# Patient Record
Sex: Female | Born: 1935
Health system: Southern US, Community
[De-identification: ages and names within clinical notes are randomized; demographics above are authoritative.]

## PROBLEM LIST (undated history)

## (undated) DIAGNOSIS — I10 Essential (primary) hypertension: Secondary | ICD-10-CM

## (undated) DIAGNOSIS — K589 Irritable bowel syndrome without diarrhea: Secondary | ICD-10-CM

## (undated) DIAGNOSIS — I471 Supraventricular tachycardia, unspecified: Secondary | ICD-10-CM

## (undated) DIAGNOSIS — R7309 Other abnormal glucose: Secondary | ICD-10-CM

## (undated) DIAGNOSIS — M1990 Unspecified osteoarthritis, unspecified site: Secondary | ICD-10-CM

## (undated) DIAGNOSIS — J40 Bronchitis, not specified as acute or chronic: Secondary | ICD-10-CM

## (undated) DIAGNOSIS — I2510 Atherosclerotic heart disease of native coronary artery without angina pectoris: Secondary | ICD-10-CM

## (undated) DIAGNOSIS — I059 Rheumatic mitral valve disease, unspecified: Secondary | ICD-10-CM

## (undated) DIAGNOSIS — I872 Venous insufficiency (chronic) (peripheral): Secondary | ICD-10-CM

## (undated) DIAGNOSIS — E785 Hyperlipidemia, unspecified: Secondary | ICD-10-CM

## (undated) DIAGNOSIS — K449 Diaphragmatic hernia without obstruction or gangrene: Secondary | ICD-10-CM

## (undated) DIAGNOSIS — I8000 Phlebitis and thrombophlebitis of superficial vessels of unspecified lower extremity: Secondary | ICD-10-CM

## (undated) DIAGNOSIS — M810 Age-related osteoporosis without current pathological fracture: Secondary | ICD-10-CM

## (undated) DIAGNOSIS — E559 Vitamin D deficiency, unspecified: Secondary | ICD-10-CM

## (undated) DIAGNOSIS — I480 Paroxysmal atrial fibrillation: Secondary | ICD-10-CM

## (undated) DIAGNOSIS — F411 Generalized anxiety disorder: Secondary | ICD-10-CM

## (undated) HISTORY — DX: Generalized anxiety disorder: F41.1

## (undated) HISTORY — DX: Other abnormal glucose: R73.09

## (undated) HISTORY — DX: Hyperlipidemia, unspecified: E78.5

## (undated) HISTORY — DX: Supraventricular tachycardia, unspecified: I47.10

## (undated) HISTORY — DX: Bronchitis, not specified as acute or chronic: J40

## (undated) HISTORY — DX: Irritable bowel syndrome, unspecified: K58.9

## (undated) HISTORY — DX: Vitamin D deficiency, unspecified: E55.9

## (undated) HISTORY — DX: Age-related osteoporosis without current pathological fracture: M81.0

## (undated) HISTORY — DX: Unspecified osteoarthritis, unspecified site: M19.90

## (undated) HISTORY — DX: Rheumatic mitral valve disease, unspecified: I05.9

## (undated) HISTORY — DX: Essential (primary) hypertension: I10

## (undated) HISTORY — DX: Phlebitis and thrombophlebitis of superficial vessels of unspecified lower extremity: I80.00

## (undated) HISTORY — DX: Venous insufficiency (chronic) (peripheral): I87.2

## (undated) HISTORY — DX: Atherosclerotic heart disease of native coronary artery without angina pectoris: I25.10

## (undated) HISTORY — PX: VESICOVAGINAL FISTULA CLOSURE W/ TAH: SUR271

## (undated) HISTORY — DX: Paroxysmal atrial fibrillation: I48.0

## (undated) HISTORY — DX: Diaphragmatic hernia without obstruction or gangrene: K44.9

## (undated) HISTORY — DX: Supraventricular tachycardia: I47.1

---

## 1999-01-29 ENCOUNTER — Other Ambulatory Visit: Admission: RE | Admit: 1999-01-29 | Discharge: 1999-01-29 | Payer: Self-pay | Admitting: Obstetrics and Gynecology

## 1999-09-30 ENCOUNTER — Ambulatory Visit (HOSPITAL_COMMUNITY): Admission: RE | Admit: 1999-09-30 | Discharge: 1999-09-30 | Payer: Self-pay | Admitting: Gastroenterology

## 1999-09-30 ENCOUNTER — Encounter: Payer: Self-pay | Admitting: Gastroenterology

## 2000-01-10 ENCOUNTER — Encounter: Payer: Self-pay | Admitting: Pulmonary Disease

## 2000-01-10 ENCOUNTER — Ambulatory Visit (HOSPITAL_COMMUNITY): Admission: RE | Admit: 2000-01-10 | Discharge: 2000-01-10 | Payer: Self-pay | Admitting: Pulmonary Disease

## 2001-02-10 ENCOUNTER — Ambulatory Visit (HOSPITAL_COMMUNITY): Admission: RE | Admit: 2001-02-10 | Discharge: 2001-02-10 | Payer: Self-pay | Admitting: Pulmonary Disease

## 2001-02-10 ENCOUNTER — Encounter: Payer: Self-pay | Admitting: Pulmonary Disease

## 2001-07-12 ENCOUNTER — Other Ambulatory Visit: Admission: RE | Admit: 2001-07-12 | Discharge: 2001-07-12 | Payer: Self-pay | Admitting: Obstetrics and Gynecology

## 2002-02-24 ENCOUNTER — Ambulatory Visit (HOSPITAL_COMMUNITY): Admission: RE | Admit: 2002-02-24 | Discharge: 2002-02-24 | Payer: Self-pay | Admitting: Obstetrics and Gynecology

## 2002-02-24 ENCOUNTER — Encounter: Payer: Self-pay | Admitting: Obstetrics and Gynecology

## 2003-03-21 ENCOUNTER — Ambulatory Visit (HOSPITAL_COMMUNITY): Admission: RE | Admit: 2003-03-21 | Discharge: 2003-03-21 | Payer: Self-pay | Admitting: Obstetrics and Gynecology

## 2004-03-25 ENCOUNTER — Ambulatory Visit (HOSPITAL_COMMUNITY): Admission: RE | Admit: 2004-03-25 | Discharge: 2004-03-25 | Payer: Self-pay | Admitting: Obstetrics and Gynecology

## 2004-04-22 ENCOUNTER — Ambulatory Visit: Payer: Self-pay | Admitting: Pulmonary Disease

## 2004-06-17 ENCOUNTER — Ambulatory Visit: Payer: Self-pay | Admitting: Pulmonary Disease

## 2004-09-11 ENCOUNTER — Ambulatory Visit: Payer: Self-pay | Admitting: Pulmonary Disease

## 2004-09-12 ENCOUNTER — Ambulatory Visit: Payer: Self-pay | Admitting: Pulmonary Disease

## 2004-10-17 ENCOUNTER — Ambulatory Visit: Payer: Self-pay | Admitting: Pulmonary Disease

## 2004-10-26 ENCOUNTER — Emergency Department (HOSPITAL_COMMUNITY): Admission: EM | Admit: 2004-10-26 | Discharge: 2004-10-26 | Payer: Self-pay | Admitting: Emergency Medicine

## 2005-04-17 ENCOUNTER — Ambulatory Visit (HOSPITAL_COMMUNITY): Admission: RE | Admit: 2005-04-17 | Discharge: 2005-04-17 | Payer: Self-pay | Admitting: Pulmonary Disease

## 2005-08-06 ENCOUNTER — Ambulatory Visit: Payer: Self-pay | Admitting: Pulmonary Disease

## 2005-09-18 ENCOUNTER — Ambulatory Visit: Payer: Self-pay | Admitting: Pulmonary Disease

## 2005-11-06 ENCOUNTER — Ambulatory Visit: Payer: Self-pay | Admitting: Pulmonary Disease

## 2005-12-19 ENCOUNTER — Ambulatory Visit: Payer: Self-pay | Admitting: Pulmonary Disease

## 2006-04-17 ENCOUNTER — Ambulatory Visit: Payer: Self-pay | Admitting: Pulmonary Disease

## 2006-04-27 ENCOUNTER — Ambulatory Visit: Payer: Self-pay | Admitting: Gastroenterology

## 2006-05-07 ENCOUNTER — Ambulatory Visit: Payer: Self-pay | Admitting: Family Medicine

## 2006-06-30 ENCOUNTER — Ambulatory Visit (HOSPITAL_COMMUNITY): Admission: RE | Admit: 2006-06-30 | Discharge: 2006-06-30 | Payer: Self-pay | Admitting: Pulmonary Disease

## 2006-10-22 ENCOUNTER — Ambulatory Visit: Payer: Self-pay | Admitting: Pulmonary Disease

## 2007-03-02 ENCOUNTER — Ambulatory Visit: Payer: Self-pay | Admitting: Internal Medicine

## 2007-03-04 ENCOUNTER — Telehealth: Payer: Self-pay | Admitting: Pulmonary Disease

## 2007-03-15 DIAGNOSIS — E7800 Pure hypercholesterolemia, unspecified: Secondary | ICD-10-CM

## 2007-03-15 DIAGNOSIS — K449 Diaphragmatic hernia without obstruction or gangrene: Secondary | ICD-10-CM | POA: Insufficient documentation

## 2007-03-15 DIAGNOSIS — J40 Bronchitis, not specified as acute or chronic: Secondary | ICD-10-CM

## 2007-03-15 DIAGNOSIS — I059 Rheumatic mitral valve disease, unspecified: Secondary | ICD-10-CM | POA: Insufficient documentation

## 2007-03-15 DIAGNOSIS — I10 Essential (primary) hypertension: Secondary | ICD-10-CM

## 2007-03-15 DIAGNOSIS — M810 Age-related osteoporosis without current pathological fracture: Secondary | ICD-10-CM

## 2007-03-15 DIAGNOSIS — K589 Irritable bowel syndrome without diarrhea: Secondary | ICD-10-CM | POA: Insufficient documentation

## 2007-03-15 DIAGNOSIS — F419 Anxiety disorder, unspecified: Secondary | ICD-10-CM

## 2007-03-15 DIAGNOSIS — M1990 Unspecified osteoarthritis, unspecified site: Secondary | ICD-10-CM | POA: Insufficient documentation

## 2007-03-16 ENCOUNTER — Ambulatory Visit: Payer: Self-pay | Admitting: Pulmonary Disease

## 2007-03-16 DIAGNOSIS — E1129 Type 2 diabetes mellitus with other diabetic kidney complication: Secondary | ICD-10-CM | POA: Insufficient documentation

## 2007-03-16 DIAGNOSIS — E119 Type 2 diabetes mellitus without complications: Secondary | ICD-10-CM | POA: Insufficient documentation

## 2007-03-16 LAB — CONVERTED CEMR LAB
ALT: 17 U/L
AST: 19 U/L
Albumin: 4 g/dL
Alkaline Phosphatase: 48 U/L
BUN: 9 mg/dL
Basophils Absolute: 0.1 10*3/uL
Basophils Relative: 1.5 % — ABNORMAL HIGH
Bilirubin, Direct: 0.1 mg/dL
CO2: 29 meq/L
Calcium: 9.4 mg/dL
Chloride: 101 meq/L
Cholesterol: 203 mg/dL
Creatinine, Ser: 0.8 mg/dL
Direct LDL: 111.7 mg/dL
Eosinophils Absolute: 0.1 10*3/uL
Eosinophils Relative: 2.7 %
GFR calc Af Amer: 91 mL/min
GFR calc non Af Amer: 75 mL/min
Glucose, Bld: 133 mg/dL — ABNORMAL HIGH
HCT: 37.3 %
HDL: 63.5 mg/dL
Hemoglobin: 12.9 g/dL
Hgb A1c MFr Bld: 6.7 % — ABNORMAL HIGH
Lymphocytes Relative: 33.9 %
MCHC: 34.5 g/dL
MCV: 90.2 fL
Monocytes Absolute: 0.4 10*3/uL
Monocytes Relative: 7.7 %
Neutro Abs: 2.6 10*3/uL
Neutrophils Relative %: 54.2 %
Platelets: 226 10*3/uL
Potassium: 4.3 meq/L
RBC: 4.14 M/uL
RDW: 12.7 %
Sodium: 137 meq/L
TSH: 0.95 u[IU]/mL
Total Bilirubin: 0.7 mg/dL
Total CHOL/HDL Ratio: 3.2
Total Protein: 7 g/dL
Triglycerides: 118 mg/dL
VLDL: 24 mg/dL
WBC: 4.9 10*3/uL

## 2007-03-24 ENCOUNTER — Ambulatory Visit: Payer: Self-pay | Admitting: Internal Medicine

## 2007-03-31 ENCOUNTER — Telehealth: Payer: Self-pay | Admitting: Pulmonary Disease

## 2007-05-06 ENCOUNTER — Telehealth (INDEPENDENT_AMBULATORY_CARE_PROVIDER_SITE_OTHER): Payer: Self-pay | Admitting: *Deleted

## 2007-05-21 ENCOUNTER — Encounter: Payer: Self-pay | Admitting: Pulmonary Disease

## 2007-05-31 ENCOUNTER — Encounter: Payer: Self-pay | Admitting: Pulmonary Disease

## 2007-08-10 ENCOUNTER — Ambulatory Visit (HOSPITAL_COMMUNITY): Admission: RE | Admit: 2007-08-10 | Discharge: 2007-08-10 | Payer: Self-pay | Admitting: Pulmonary Disease

## 2007-08-23 ENCOUNTER — Ambulatory Visit: Payer: Self-pay | Admitting: Pulmonary Disease

## 2007-08-29 LAB — CONVERTED CEMR LAB
Alkaline Phosphatase: 54 units/L (ref 39–117)
Basophils Relative: 0.7 % (ref 0.0–1.0)
Bilirubin, Direct: 0.1 mg/dL (ref 0.0–0.3)
CO2: 31 meq/L (ref 19–32)
Calcium: 9.7 mg/dL (ref 8.4–10.5)
Chloride: 107 meq/L (ref 96–112)
Cholesterol: 169 mg/dL (ref 0–200)
Creatinine, Ser: 1 mg/dL (ref 0.4–1.2)
Eosinophils Relative: 3.6 % (ref 0.0–5.0)
GFR calc Af Amer: 70 mL/min
HDL: 61 mg/dL (ref >39.0)
Hgb A1c MFr Bld: 6.7 % — ABNORMAL HIGH (ref 4.6–6.0)
LDL Cholesterol: 92 mg/dL (ref 0–99)
Monocytes Absolute: 0.5 10*3/uL (ref 0.1–1.0)
Neutro Abs: 3.1 10*3/uL (ref 1.4–7.7)
Platelets: 240 10*3/uL (ref 150–400)
Potassium: 5.1 meq/L (ref 3.5–5.1)
RDW: 12.3 % (ref 11.5–14.6)
Triglycerides: 80 mg/dL (ref 0–149)
VLDL: 16 mg/dL (ref 0–40)
Vit D, 1,25-Dihydroxy: 21 — ABNORMAL LOW (ref 30–89)

## 2007-10-11 ENCOUNTER — Telehealth (INDEPENDENT_AMBULATORY_CARE_PROVIDER_SITE_OTHER): Payer: Self-pay | Admitting: *Deleted

## 2007-10-12 ENCOUNTER — Ambulatory Visit: Payer: Self-pay | Admitting: Pulmonary Disease

## 2007-10-13 ENCOUNTER — Emergency Department (HOSPITAL_COMMUNITY): Admission: EM | Admit: 2007-10-13 | Discharge: 2007-10-13 | Payer: Self-pay | Admitting: Emergency Medicine

## 2007-10-13 ENCOUNTER — Ambulatory Visit: Payer: Self-pay

## 2007-10-20 ENCOUNTER — Ambulatory Visit: Payer: Self-pay | Admitting: Pulmonary Disease

## 2007-10-20 DIAGNOSIS — I8000 Phlebitis and thrombophlebitis of superficial vessels of unspecified lower extremity: Secondary | ICD-10-CM

## 2007-10-22 ENCOUNTER — Telehealth: Payer: Self-pay | Admitting: Pulmonary Disease

## 2007-10-22 LAB — CONVERTED CEMR LAB: Prothrombin Time: 23.7 s — ABNORMAL HIGH (ref 10.9–13.3)

## 2007-10-27 ENCOUNTER — Ambulatory Visit: Payer: Self-pay | Admitting: Pulmonary Disease

## 2007-10-28 ENCOUNTER — Telehealth (INDEPENDENT_AMBULATORY_CARE_PROVIDER_SITE_OTHER): Payer: Self-pay | Admitting: *Deleted

## 2007-10-28 LAB — CONVERTED CEMR LAB: INR: 2 — ABNORMAL HIGH (ref 0.8–1.0)

## 2007-11-03 ENCOUNTER — Ambulatory Visit: Payer: Self-pay | Admitting: Pulmonary Disease

## 2007-11-04 ENCOUNTER — Telehealth: Payer: Self-pay | Admitting: Pulmonary Disease

## 2007-11-04 LAB — CONVERTED CEMR LAB: Prothrombin Time: 22.1 s — ABNORMAL HIGH (ref 10.9–13.3)

## 2007-11-16 ENCOUNTER — Ambulatory Visit: Payer: Self-pay | Admitting: Pulmonary Disease

## 2007-11-18 ENCOUNTER — Telehealth: Payer: Self-pay | Admitting: Pulmonary Disease

## 2007-11-25 LAB — CONVERTED CEMR LAB: INR: 2.2 — ABNORMAL HIGH (ref 0.8–1.0)

## 2007-12-01 ENCOUNTER — Ambulatory Visit: Payer: Self-pay | Admitting: Pulmonary Disease

## 2007-12-06 ENCOUNTER — Telehealth (INDEPENDENT_AMBULATORY_CARE_PROVIDER_SITE_OTHER): Payer: Self-pay | Admitting: *Deleted

## 2007-12-06 LAB — CONVERTED CEMR LAB: Prothrombin Time: 16.1 s — ABNORMAL HIGH (ref 10.9–13.3)

## 2007-12-07 ENCOUNTER — Ambulatory Visit: Payer: Self-pay | Admitting: Pulmonary Disease

## 2007-12-21 ENCOUNTER — Ambulatory Visit: Payer: Self-pay | Admitting: Pulmonary Disease

## 2007-12-22 ENCOUNTER — Telehealth: Payer: Self-pay | Admitting: Pulmonary Disease

## 2007-12-22 LAB — CONVERTED CEMR LAB: INR: 3.3 — ABNORMAL HIGH (ref 0.8–1.0)

## 2008-01-03 ENCOUNTER — Ambulatory Visit: Payer: Self-pay | Admitting: Pulmonary Disease

## 2008-01-05 ENCOUNTER — Telehealth: Payer: Self-pay | Admitting: Pulmonary Disease

## 2008-01-05 LAB — CONVERTED CEMR LAB
INR: 1.5 — ABNORMAL HIGH (ref 0.8–1.0)
Prothrombin Time: 17.3 s — ABNORMAL HIGH (ref 10.9–13.3)

## 2008-01-18 ENCOUNTER — Ambulatory Visit: Payer: Self-pay | Admitting: Vascular Surgery

## 2008-01-18 ENCOUNTER — Ambulatory Visit: Payer: Self-pay | Admitting: Pulmonary Disease

## 2008-01-19 ENCOUNTER — Encounter: Payer: Self-pay | Admitting: Pulmonary Disease

## 2008-01-19 ENCOUNTER — Ambulatory Visit: Admission: RE | Admit: 2008-01-19 | Discharge: 2008-01-19 | Payer: Self-pay | Admitting: Pulmonary Disease

## 2008-01-19 LAB — CONVERTED CEMR LAB
ALT: 21 units/L (ref 0–35)
AST: 19 units/L (ref 0–37)
Alkaline Phosphatase: 53 units/L (ref 39–117)
BUN: 12 mg/dL (ref 6–23)
Basophils Absolute: 0 10*3/uL (ref 0.0–0.1)
Basophils Relative: 0.6 % (ref 0.0–3.0)
Bilirubin, Direct: 0.1 mg/dL (ref 0.0–0.3)
Eosinophils Absolute: 0.1 10*3/uL (ref 0.0–0.7)
GFR calc non Af Amer: 52 mL/min
Glucose, Bld: 108 mg/dL — ABNORMAL HIGH (ref 70–99)
HCT: 37 % (ref 36.0–46.0)
Hgb A1c MFr Bld: 6.7 % — ABNORMAL HIGH (ref 4.6–6.0)
INR: 1.7 — ABNORMAL HIGH (ref 0.8–1.0)
Lymphocytes Relative: 30.4 % (ref 12.0–46.0)
MCV: 91 fL (ref 78.0–100.0)
Monocytes Relative: 8.9 % (ref 3.0–12.0)
Neutro Abs: 3.7 10*3/uL (ref 1.4–7.7)
Potassium: 4.2 meq/L (ref 3.5–5.1)
RBC: 4.06 M/uL (ref 3.87–5.11)
RDW: 13.1 % (ref 11.5–14.6)
Total Bilirubin: 0.7 mg/dL (ref 0.3–1.2)
WBC: 6.3 10*3/uL (ref 4.5–10.5)

## 2008-02-09 ENCOUNTER — Ambulatory Visit: Payer: Self-pay | Admitting: Pulmonary Disease

## 2008-02-10 ENCOUNTER — Telehealth: Payer: Self-pay | Admitting: Pulmonary Disease

## 2008-02-22 ENCOUNTER — Ambulatory Visit: Payer: Self-pay | Admitting: Pulmonary Disease

## 2008-03-06 ENCOUNTER — Ambulatory Visit: Payer: Self-pay | Admitting: Pulmonary Disease

## 2008-03-07 ENCOUNTER — Telehealth: Payer: Self-pay | Admitting: Pulmonary Disease

## 2008-04-03 ENCOUNTER — Ambulatory Visit: Payer: Self-pay | Admitting: Pulmonary Disease

## 2008-04-04 ENCOUNTER — Telehealth: Payer: Self-pay | Admitting: Pulmonary Disease

## 2008-04-04 LAB — CONVERTED CEMR LAB: INR: 1.8 — ABNORMAL HIGH (ref 0.8–1.0)

## 2008-04-19 ENCOUNTER — Ambulatory Visit: Payer: Self-pay | Admitting: Pulmonary Disease

## 2008-04-19 DIAGNOSIS — E559 Vitamin D deficiency, unspecified: Secondary | ICD-10-CM | POA: Insufficient documentation

## 2008-04-21 ENCOUNTER — Telehealth: Payer: Self-pay | Admitting: Pulmonary Disease

## 2008-04-23 LAB — CONVERTED CEMR LAB
AST: 17 units/L (ref 0–37)
Albumin: 3.9 g/dL (ref 3.5–5.2)
BUN: 11 mg/dL (ref 6–23)
Chloride: 104 meq/L (ref 96–112)
Eosinophils Absolute: 0.1 10*3/uL (ref 0.0–0.7)
Eosinophils Relative: 2.3 % (ref 0.0–5.0)
GFR calc Af Amer: 79 mL/min
GFR calc non Af Amer: 66 mL/min
Glucose, Bld: 129 mg/dL — ABNORMAL HIGH (ref 70–99)
HDL: 56.9 mg/dL (ref >39.0)
Hgb A1c MFr Bld: 6.7 % — ABNORMAL HIGH (ref 4.6–6.0)
LDL Cholesterol: 99 mg/dL (ref 0–99)
Lymphocytes Relative: 31.9 % (ref 12.0–46.0)
MCHC: 34.8 g/dL (ref 30.0–36.0)
MCV: 89.2 fL (ref 78.0–100.0)
Monocytes Relative: 8.8 % (ref 3.0–12.0)
Neutrophils Relative %: 56.7 % (ref 43.0–77.0)
Platelets: 205 10*3/uL (ref 150–400)
Potassium: 4.7 meq/L (ref 3.5–5.1)
RBC: 4.1 M/uL (ref 3.87–5.11)
RDW: 12.7 % (ref 11.5–14.6)
TSH: 0.99 microintl units/mL (ref 0.35–5.50)
Total CHOL/HDL Ratio: 3
Triglycerides: 70 mg/dL (ref 0–149)
VLDL: 14 mg/dL (ref 0–40)
WBC: 4.8 10*3/uL (ref 4.5–10.5)

## 2008-05-01 ENCOUNTER — Encounter: Payer: Self-pay | Admitting: Pulmonary Disease

## 2008-05-02 ENCOUNTER — Telehealth (INDEPENDENT_AMBULATORY_CARE_PROVIDER_SITE_OTHER): Payer: Self-pay | Admitting: *Deleted

## 2008-05-22 ENCOUNTER — Ambulatory Visit: Payer: Self-pay | Admitting: Pulmonary Disease

## 2008-05-23 ENCOUNTER — Telehealth: Payer: Self-pay | Admitting: Pulmonary Disease

## 2008-05-24 LAB — CONVERTED CEMR LAB: Prothrombin Time: 18.4 s — ABNORMAL HIGH (ref 10.9–13.3)

## 2008-06-12 ENCOUNTER — Ambulatory Visit: Payer: Self-pay | Admitting: Pulmonary Disease

## 2008-06-12 ENCOUNTER — Telehealth (INDEPENDENT_AMBULATORY_CARE_PROVIDER_SITE_OTHER): Payer: Self-pay | Admitting: *Deleted

## 2008-06-13 ENCOUNTER — Telehealth: Payer: Self-pay | Admitting: Pulmonary Disease

## 2008-06-13 LAB — CONVERTED CEMR LAB
INR: 2.9 — ABNORMAL HIGH (ref 0.8–1.0)
Prothrombin Time: 29.1 s — ABNORMAL HIGH (ref 10.9–13.3)

## 2008-06-26 ENCOUNTER — Telehealth (INDEPENDENT_AMBULATORY_CARE_PROVIDER_SITE_OTHER): Payer: Self-pay | Admitting: *Deleted

## 2008-06-27 ENCOUNTER — Emergency Department (HOSPITAL_COMMUNITY): Admission: EM | Admit: 2008-06-27 | Discharge: 2008-06-27 | Payer: Self-pay | Admitting: Emergency Medicine

## 2008-06-30 ENCOUNTER — Emergency Department (HOSPITAL_COMMUNITY): Admission: EM | Admit: 2008-06-30 | Discharge: 2008-07-01 | Payer: Self-pay | Admitting: Emergency Medicine

## 2008-07-10 ENCOUNTER — Ambulatory Visit: Payer: Self-pay | Admitting: Internal Medicine

## 2008-08-17 ENCOUNTER — Ambulatory Visit: Payer: Self-pay | Admitting: Pulmonary Disease

## 2008-08-19 LAB — CONVERTED CEMR LAB
BUN: 10 mg/dL (ref 6–23)
CO2: 32 meq/L (ref 19–32)
Calcium: 9.5 mg/dL (ref 8.4–10.5)
Chloride: 106 meq/L (ref 96–112)
Eosinophils Absolute: 0.1 10*3/uL (ref 0.0–0.7)
Eosinophils Relative: 2.9 % (ref 0.0–5.0)
GFR calc non Af Amer: 79.18 mL/min (ref >60)
Glucose, Bld: 105 mg/dL — ABNORMAL HIGH (ref 70–99)
HCT: 36.7 % (ref 36.0–46.0)
Lymphs Abs: 1.5 10*3/uL (ref 0.7–4.0)
Monocytes Absolute: 0.4 10*3/uL (ref 0.1–1.0)
Monocytes Relative: 8.6 % (ref 3.0–12.0)
Platelets: 209 10*3/uL (ref 150.0–400.0)
RBC: 4.09 M/uL (ref 3.87–5.11)
Sodium: 140 meq/L (ref 135–145)
WBC: 4.4 10*3/uL — ABNORMAL LOW (ref 4.5–10.5)

## 2008-08-22 ENCOUNTER — Encounter: Payer: Self-pay | Admitting: Pulmonary Disease

## 2008-08-22 ENCOUNTER — Ambulatory Visit: Payer: Self-pay

## 2008-09-22 ENCOUNTER — Telehealth (INDEPENDENT_AMBULATORY_CARE_PROVIDER_SITE_OTHER): Payer: Self-pay | Admitting: *Deleted

## 2008-09-26 ENCOUNTER — Ambulatory Visit: Payer: Self-pay | Admitting: Pulmonary Disease

## 2008-09-26 ENCOUNTER — Ambulatory Visit: Payer: Self-pay

## 2008-09-28 ENCOUNTER — Telehealth (INDEPENDENT_AMBULATORY_CARE_PROVIDER_SITE_OTHER): Payer: Self-pay | Admitting: *Deleted

## 2008-10-09 ENCOUNTER — Ambulatory Visit: Payer: Self-pay | Admitting: Pulmonary Disease

## 2008-10-10 ENCOUNTER — Telehealth: Payer: Self-pay | Admitting: Pulmonary Disease

## 2008-10-19 ENCOUNTER — Ambulatory Visit (HOSPITAL_COMMUNITY): Admission: RE | Admit: 2008-10-19 | Discharge: 2008-10-19 | Payer: Self-pay | Admitting: Pulmonary Disease

## 2008-10-21 LAB — CONVERTED CEMR LAB: Vit D, 25-Hydroxy: 23 ng/mL — ABNORMAL LOW (ref 30–89)

## 2008-10-26 ENCOUNTER — Ambulatory Visit: Payer: Self-pay | Admitting: Pulmonary Disease

## 2008-10-30 LAB — CONVERTED CEMR LAB
INR: 1.5 — ABNORMAL HIGH (ref 0.8–1.0)
Prothrombin Time: 16.3 s — ABNORMAL HIGH (ref 10.9–13.3)

## 2008-11-14 ENCOUNTER — Encounter: Payer: Self-pay | Admitting: Pulmonary Disease

## 2008-11-14 ENCOUNTER — Ambulatory Visit: Payer: Self-pay | Admitting: Family Medicine

## 2008-12-05 ENCOUNTER — Ambulatory Visit: Payer: Self-pay | Admitting: Pulmonary Disease

## 2008-12-06 LAB — CONVERTED CEMR LAB: INR: 1.7 — ABNORMAL HIGH (ref 0.8–1.0)

## 2009-01-02 ENCOUNTER — Telehealth (INDEPENDENT_AMBULATORY_CARE_PROVIDER_SITE_OTHER): Payer: Self-pay | Admitting: *Deleted

## 2009-01-03 ENCOUNTER — Ambulatory Visit: Payer: Self-pay | Admitting: Pulmonary Disease

## 2009-01-04 ENCOUNTER — Telehealth: Payer: Self-pay | Admitting: Pulmonary Disease

## 2009-01-05 LAB — CONVERTED CEMR LAB: Prothrombin Time: 12.6 s — ABNORMAL HIGH (ref 9.1–11.7)

## 2009-01-29 ENCOUNTER — Ambulatory Visit: Payer: Self-pay | Admitting: Pulmonary Disease

## 2009-01-30 ENCOUNTER — Telehealth (INDEPENDENT_AMBULATORY_CARE_PROVIDER_SITE_OTHER): Payer: Self-pay | Admitting: *Deleted

## 2009-02-02 ENCOUNTER — Ambulatory Visit: Payer: Self-pay | Admitting: Pulmonary Disease

## 2009-03-01 ENCOUNTER — Ambulatory Visit: Payer: Self-pay | Admitting: Pulmonary Disease

## 2009-03-02 ENCOUNTER — Telehealth: Payer: Self-pay | Admitting: Pulmonary Disease

## 2009-03-02 LAB — CONVERTED CEMR LAB: Prothrombin Time: 19.3 s — ABNORMAL HIGH (ref 9.1–11.7)

## 2009-03-22 ENCOUNTER — Telehealth (INDEPENDENT_AMBULATORY_CARE_PROVIDER_SITE_OTHER): Payer: Self-pay | Admitting: *Deleted

## 2009-03-30 ENCOUNTER — Telehealth (INDEPENDENT_AMBULATORY_CARE_PROVIDER_SITE_OTHER): Payer: Self-pay | Admitting: *Deleted

## 2009-04-11 ENCOUNTER — Ambulatory Visit: Payer: Self-pay | Admitting: Pulmonary Disease

## 2009-04-25 ENCOUNTER — Emergency Department (HOSPITAL_COMMUNITY): Admission: EM | Admit: 2009-04-25 | Discharge: 2009-04-25 | Payer: Self-pay | Admitting: Emergency Medicine

## 2009-04-29 ENCOUNTER — Emergency Department (HOSPITAL_COMMUNITY): Admission: EM | Admit: 2009-04-29 | Discharge: 2009-04-29 | Payer: Self-pay | Admitting: Emergency Medicine

## 2009-05-08 ENCOUNTER — Ambulatory Visit: Payer: Self-pay | Admitting: Pulmonary Disease

## 2009-05-09 ENCOUNTER — Telehealth: Payer: Self-pay | Admitting: Pulmonary Disease

## 2009-05-09 LAB — CONVERTED CEMR LAB
INR: 1.8 — ABNORMAL HIGH (ref 0.8–1.0)
Prothrombin Time: 18.3 s — ABNORMAL HIGH (ref 9.1–11.7)

## 2009-05-15 ENCOUNTER — Ambulatory Visit: Payer: Self-pay | Admitting: Pulmonary Disease

## 2009-06-07 ENCOUNTER — Ambulatory Visit: Payer: Self-pay | Admitting: Pulmonary Disease

## 2009-06-10 LAB — CONVERTED CEMR LAB
AST: 23 units/L (ref 0–37)
Bilirubin, Direct: 0.1 mg/dL (ref 0.0–0.3)
Cholesterol: 191 mg/dL (ref 0–200)
Eosinophils Absolute: 0.1 10*3/uL (ref 0.0–0.7)
Eosinophils Relative: 2.4 % (ref 0.0–5.0)
GFR calc non Af Amer: 79 mL/min (ref >60)
Glucose, Bld: 124 mg/dL — ABNORMAL HIGH (ref 70–99)
HCT: 38.9 % (ref 36.0–46.0)
HDL: 80.7 mg/dL (ref >39.00)
INR: 1.5 — ABNORMAL HIGH (ref 0.8–1.0)
LDL Cholesterol: 87 mg/dL (ref 0–99)
Monocytes Absolute: 0.3 10*3/uL (ref 0.1–1.0)
Platelets: 208 10*3/uL (ref 150.0–400.0)
Potassium: 3.8 meq/L (ref 3.5–5.1)
RDW: 12.4 % (ref 11.5–14.6)
Total Bilirubin: 0.7 mg/dL (ref 0.3–1.2)
Total CHOL/HDL Ratio: 2
Total Protein: 7.8 g/dL (ref 6.0–8.3)
Triglycerides: 119 mg/dL (ref 0.0–149.0)
VLDL: 23.8 mg/dL (ref 0.0–40.0)

## 2009-06-21 ENCOUNTER — Emergency Department (HOSPITAL_COMMUNITY): Admission: EM | Admit: 2009-06-21 | Discharge: 2009-06-21 | Payer: Self-pay | Admitting: Emergency Medicine

## 2009-06-25 ENCOUNTER — Emergency Department (HOSPITAL_COMMUNITY): Admission: EM | Admit: 2009-06-25 | Discharge: 2009-06-25 | Payer: Self-pay | Admitting: Emergency Medicine

## 2009-06-25 ENCOUNTER — Telehealth: Payer: Self-pay | Admitting: Pulmonary Disease

## 2009-07-05 ENCOUNTER — Ambulatory Visit: Payer: Self-pay | Admitting: Pulmonary Disease

## 2009-07-24 ENCOUNTER — Ambulatory Visit: Payer: Self-pay | Admitting: Pulmonary Disease

## 2009-08-07 ENCOUNTER — Ambulatory Visit: Payer: Self-pay | Admitting: Pulmonary Disease

## 2009-08-07 DIAGNOSIS — R079 Chest pain, unspecified: Secondary | ICD-10-CM | POA: Insufficient documentation

## 2009-09-04 ENCOUNTER — Ambulatory Visit: Payer: Self-pay | Admitting: Pulmonary Disease

## 2009-09-05 LAB — CONVERTED CEMR LAB
INR: 2.3 — ABNORMAL HIGH (ref 0.8–1.0)
Prothrombin Time: 23.6 s — ABNORMAL HIGH (ref 9.1–11.7)

## 2009-10-04 ENCOUNTER — Ambulatory Visit: Payer: Self-pay | Admitting: Pulmonary Disease

## 2009-10-07 LAB — CONVERTED CEMR LAB: INR: 2.5 — ABNORMAL HIGH (ref 0.8–1.0)

## 2009-10-08 ENCOUNTER — Telehealth: Payer: Self-pay | Admitting: Pulmonary Disease

## 2009-10-16 ENCOUNTER — Telehealth (INDEPENDENT_AMBULATORY_CARE_PROVIDER_SITE_OTHER): Payer: Self-pay | Admitting: *Deleted

## 2009-10-16 ENCOUNTER — Emergency Department (HOSPITAL_COMMUNITY): Admission: EM | Admit: 2009-10-16 | Discharge: 2009-10-16 | Payer: Self-pay | Admitting: Emergency Medicine

## 2009-10-18 ENCOUNTER — Telehealth: Payer: Self-pay | Admitting: Adult Health

## 2009-11-01 ENCOUNTER — Ambulatory Visit: Payer: Self-pay | Admitting: Pulmonary Disease

## 2009-11-05 ENCOUNTER — Telehealth: Payer: Self-pay | Admitting: Pulmonary Disease

## 2009-11-05 LAB — CONVERTED CEMR LAB
INR: 2.4 — ABNORMAL HIGH (ref 0.8–1.0)
Prothrombin Time: 25.3 s — ABNORMAL HIGH (ref 9.7–11.8)

## 2009-11-12 ENCOUNTER — Ambulatory Visit: Payer: Self-pay | Admitting: Pulmonary Disease

## 2009-11-13 LAB — CONVERTED CEMR LAB
BUN: 14 mg/dL (ref 6–23)
Calcium: 8.9 mg/dL (ref 8.4–10.5)
Creatinine, Ser: 0.9 mg/dL (ref 0.4–1.2)
GFR calc non Af Amer: 84.29 mL/min (ref >60)
Hgb A1c MFr Bld: 6.8 % — ABNORMAL HIGH (ref 4.6–6.5)

## 2009-11-22 ENCOUNTER — Ambulatory Visit (HOSPITAL_COMMUNITY): Admission: RE | Admit: 2009-11-22 | Discharge: 2009-11-22 | Payer: Self-pay | Admitting: Pulmonary Disease

## 2009-12-06 ENCOUNTER — Ambulatory Visit: Payer: Self-pay | Admitting: Pulmonary Disease

## 2009-12-10 ENCOUNTER — Telehealth: Payer: Self-pay | Admitting: Pulmonary Disease

## 2010-01-10 ENCOUNTER — Ambulatory Visit: Payer: Self-pay | Admitting: Pulmonary Disease

## 2010-01-14 LAB — CONVERTED CEMR LAB
INR: 1.8 — ABNORMAL HIGH
Prothrombin Time: 18.8 s — ABNORMAL HIGH

## 2010-02-08 ENCOUNTER — Ambulatory Visit: Payer: Self-pay | Admitting: Pulmonary Disease

## 2010-02-11 ENCOUNTER — Telehealth (INDEPENDENT_AMBULATORY_CARE_PROVIDER_SITE_OTHER): Payer: Self-pay | Admitting: *Deleted

## 2010-03-11 ENCOUNTER — Ambulatory Visit: Payer: Self-pay | Admitting: Pulmonary Disease

## 2010-03-13 ENCOUNTER — Telehealth: Payer: Self-pay | Admitting: Pulmonary Disease

## 2010-03-13 LAB — CONVERTED CEMR LAB
INR: 1.3 — ABNORMAL HIGH (ref 0.8–1.0)
Prothrombin Time: 13.8 s — ABNORMAL HIGH (ref 9.7–11.8)

## 2010-04-10 ENCOUNTER — Ambulatory Visit: Payer: Self-pay | Admitting: Pulmonary Disease

## 2010-04-11 ENCOUNTER — Telehealth: Payer: Self-pay | Admitting: Pulmonary Disease

## 2010-05-04 ENCOUNTER — Encounter: Payer: Self-pay | Admitting: Pulmonary Disease

## 2010-05-09 ENCOUNTER — Ambulatory Visit
Admission: RE | Admit: 2010-05-09 | Discharge: 2010-05-09 | Payer: Self-pay | Source: Home / Self Care | Attending: Pulmonary Disease | Admitting: Pulmonary Disease

## 2010-05-09 ENCOUNTER — Other Ambulatory Visit: Payer: Self-pay | Admitting: Pulmonary Disease

## 2010-05-09 LAB — PROTIME-INR
INR: 1.9 ratio — ABNORMAL HIGH (ref 0.8–1.0)
Prothrombin Time: 19.8 s — ABNORMAL HIGH (ref 10.2–12.4)

## 2010-05-13 ENCOUNTER — Ambulatory Visit: Admit: 2010-05-13 | Payer: Self-pay | Admitting: Pulmonary Disease

## 2010-05-13 ENCOUNTER — Telehealth (INDEPENDENT_AMBULATORY_CARE_PROVIDER_SITE_OTHER): Payer: Self-pay | Admitting: *Deleted

## 2010-05-14 ENCOUNTER — Telehealth: Payer: Self-pay | Admitting: Pulmonary Disease

## 2010-05-16 NOTE — Progress Notes (Signed)
Summary: nos appt  Phone Note Call from Patient   Caller: [email or web address]  Call For: Deno Sida Summary of Call: LMTCB x2 to rsc nos from 7/7. Initial call taken by: Darletta Moll,  October 18, 2009 3:38 PM

## 2010-05-16 NOTE — Progress Notes (Signed)
Summary: results - LMTCB x1  Phone Note Call from Patient Call back at Home Phone [phone]   Caller: Patient Call For: Asaiah Hunnicutt Reason for Call: Talk to Nurse Summary of Call: pt calling to get coumadin results.  please call her back. Initial call taken by: Eugene Gavia,  November 05, 2009 4:12 PM  Follow-up for Phone Call        PT/INR unsigned in EMR.  will forward to MW for recs.  please advise, thanks! Boone Master CNA/MA  November 05, 2009 4:18 PM  perfect, no change, recheck one month Follow-up by: Nyoka Cowden MD,  November 05, 2009 4:19 PM  Additional Follow-up for Phone Call Additional follow up Details #1::        [facility] TCB x1 Boone Master CNA/MA  November 05, 2009 4:21 PM    pt returned the call and is aware to keep the same and we will recheck in 1 month---the week of august 22 and pt to call the next day for results.  pt voiced her understanding of this. Randell Loop [facility]  November 05, 2009 4:27 PM

## 2010-05-16 NOTE — Progress Notes (Signed)
Summary: protime  Phone Note Call from Patient Call back at Home Phone [phone]   Caller: Patient Call For: nadel Summary of Call: pt waiting to hear back re: protime.  Initial call taken by: Tivis Ringer, CNA,  March 13, 2010 4:38 PM  Follow-up for Phone Call        Pls advise results, I printed them thanks Vernie Murders  March 13, 2010 4:44 PM   Additional Follow-up for Phone Call Additional follow up Details #1::        called and spoke with pt about her protime results----not thin enough per SN---rec now are for coumadin 5mg       1/2 tab on monday and 1 tab x 6 days---t,w,th,f,s,s.  pt repeated this back to me to make sure that she understood the dosing.  she will return to the lab dec 28 for repeat protime check. Randell Loop CMA  March 14, 2010 8:56 AM
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
## 2010-05-16 NOTE — Progress Notes (Signed)
Summary: lab results  Phone Note Call from Patient Call back at Home Phone [phone]   Caller: Patient Call For: Arvis Miguez Reason for Call: Lab or Test Results Summary of Call: Wants lab results. Initial call taken by: Darletta Moll,  October 08, 2009 9:58 AM  Follow-up for Phone Call        per SN---on coumadin 5mg   1 tab daily x 7 days---per SN recs---decrease---1 tab daily x 5 days  t,w,f,s,s and 1/2 tab x 2 days   m,th. this new dosing has been reviewed with pt and she did repeat this back to me correctly.  she is aware to call for any concerns or questions. called and spoke with pt and she is aware.  lab appt made for recheck of protime for week of 7-18. pt is aware of this appt and to call the next day for results.

## 2010-05-16 NOTE — Progress Notes (Signed)
Summary: protime results  Phone Note Call from Patient Call back at Home Phone [phone]   Caller: Patient Call For: NADEL Reason for Call: Talk to Nurse Summary of Call: Requesting protime results.  Blood was drawn Friday. Initial call taken by: Lehman Prom,  February 11, 2010 2:16 PM  Follow-up for Phone Call        please advise of lab results.  PT is unsigned in EMR.  thanks! Boone Master CNA/MA  February 11, 2010 3:35 PM   called spoke with patient.  advised of PT results/recs as stated by SN in append to 02-08-10 protime.  pt to keep coumadin dosing the same.  pt verbalized her understanding and will return in 1 months' time for redraw.  lab appt scheduled in idx. Boone Master CNA/MA  February 12, 2010 4:51 PM

## 2010-05-16 NOTE — Progress Notes (Signed)
Summary: protime  Phone Note Call from Patient Call back at Home Phone [phone]   Caller: Patient Call For: Tywanna Seifer Summary of Call: pt requests results / protime.  Initial call taken by: Tivis Ringer, CNA,  December 10, 2009 2:07 PM  Follow-up for Phone Call        PT unsigned in EMR -- Dr. Kriste Basque pls advise.  Thanks! Gweneth Dimitri RN  December 10, 2009 2:16 PM   Additional Follow-up for Phone Call Additional follow up Details #1::        per SN---keep the same dosing of coumadin 5mg    1 x 5 days  t,w,f,s,s and 1/2 x 2 days on m, th.  called and spoke with pt and she is aware and will return in 1 month for recheck of protime.  lmomtcb for pt Randell Loop [facility]  December 10, 2009 4:22 PM     Additional Follow-up for Phone Call Additional follow up Details #2::    pt returned my call and she is aware of protime recheck in the week of sept 26.  pt will call back for any questions and she is aware to take the coumadin the same for now. Randell Loop [facility]  December 10, 2009 4:52 PM

## 2010-05-16 NOTE — Progress Notes (Signed)
Summary: burning in chest  Phone Note Call from Patient   Caller: Patient Call For: [name] Summary of Call: pt went to er for burning in chest was told to follow up with dr Korion Cuevas  Initial call taken by: Rickard Patience,  June 25, 2009 10:32 AM  Follow-up for Phone Call        pt went to the ER over the weekend for chest pain. she was released with inst to f/u with SN in 3 days. Pt schedueld to see TP on 3/17 at 4:15 pt aware. Carron Curie CMA  June 25, 2009 10:47 AM

## 2010-05-16 NOTE — Progress Notes (Signed)
Summary: appt  Phone Note Call from Patient Call back at Home Phone [phone] Call back at cell# [phone]   Caller: Patient Call For: nadel Reason for Call: Talk to Nurse Summary of Call: Forehead real tight, eyes swelling, has been outside some.  Started Sunday. itching really bad.  Would like to be seen. Initial call taken by: Eugene Gavia,  October 16, 2009 1:32 PM  Follow-up for Phone Call        Spoke with pt.  She c/o "itching all over"- red rash on stomach and also watery eyes.  Left eye is swollen per pt.  OV with TP tommorrow am at 9:00 am.  Advised per JJ for her to be here at 9:15am.  I advised that if symptoms persist or worsen to go to the nearest UC or ER. Pt verbalized understanding. Follow-up by: Vernie Murders,  October 16, 2009 3:10 PM

## 2010-05-16 NOTE — Progress Notes (Signed)
Summary: PT results -  Phone Note Call from Patient Call back at Home Phone [phone]   Caller: Patient Call For: DR NADEL Summary of Call: Patient phoned and would like the results of her labs drawn on Tuesday. Patient can be reached at [phone] if she isnt home she can be reached at [phone] Initial call taken by: Vedia Coffer,  April 11, 2010 3:49 PM  Follow-up for Phone Call        Pt is requesting results of PT/INR. I have printed results. Please advise.Carron Curie CMA  April 11, 2010 4:58 PM  per SN: pt takes coumadin 5mg , 1/2 tab on Mon and 1 whole tab on Tues, Wed, Thurs, Fri, Sat, Sun.  PT looks good, keep coumadin dosing the same.  recheck in 3-4weeks and call the day after for results.  LMOM TCB x1.   Boone Master CNA/MA  April 11, 2010 5:27 PM    pt aware of recs. I verified coumadin dosing wth her. Order palcedfor recheck in 3-4 weeks. Carron Curie CMA  April 12, 2010 11:40 AM

## 2010-05-16 NOTE — Progress Notes (Signed)
Summary: results  Phone Note Call from Patient Call back at Home Phone [phone]   Caller: Patient Call For: Doc Mandala Summary of Call: wants results of labs Initial call taken by: Tivis Ringer, CNA,  May 09, 2009 12:14 PM  Follow-up for Phone Call        Pt calling for results of PT/INR. Please advise. Thank you. Zackery Barefoot CMA  May 09, 2009 12:25 PM     called and spoke with pt and per SN---she is on the coumadin 5mg   1 by mouth once daily and we will keep this the same dosing---and recheck in one month the week of feb 21.  pt voiced her understanding of this. Randell Loop CMA  May 09, 2009 4:45 PM
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
## 2010-05-21 ENCOUNTER — Encounter: Payer: Self-pay | Admitting: Pulmonary Disease

## 2010-05-21 ENCOUNTER — Ambulatory Visit (INDEPENDENT_AMBULATORY_CARE_PROVIDER_SITE_OTHER): Payer: Medicare PPO | Admitting: Pulmonary Disease

## 2010-05-21 DIAGNOSIS — I059 Rheumatic mitral valve disease, unspecified: Secondary | ICD-10-CM

## 2010-05-21 DIAGNOSIS — I10 Essential (primary) hypertension: Secondary | ICD-10-CM

## 2010-05-21 DIAGNOSIS — I872 Venous insufficiency (chronic) (peripheral): Secondary | ICD-10-CM

## 2010-05-21 DIAGNOSIS — I8000 Phlebitis and thrombophlebitis of superficial vessels of unspecified lower extremity: Secondary | ICD-10-CM

## 2010-05-22 NOTE — Progress Notes (Signed)
Summary: results of labs  Phone Note Call from Patient   Caller: Patient Call For: Kriste Basque Summary of Call: Patient was calling to get the results of her labs from Thursday for her Coumadin. She can be reached at [phone] if she isnt here she can be reached at [phone] Initial call taken by: Vedia Coffer,  May 13, 2010 4:08 PM  Follow-up for Phone Call        Pt requesting results of her coumadin from 05-09-10. I have printe dresults. Please advise.Carron Curie CMA  May 13, 2010 4:41 PM  Per SN, protime looks good.  Continue same dose and recheck it in 1 month.   Gweneth Dimitri RN  May 14, 2010 10:46 AM   Additional Follow-up for Phone Call Additional follow up Details #1::        Pt aware of protime results and recs per SN. She verbalized understanding. Additional Follow-up by: Gweneth Dimitri RN,  May 14, 2010 10:48 AM

## 2010-05-22 NOTE — Progress Notes (Signed)
Summary: nos appt  Phone Note Call from Patient   Caller: [email or web address]  Call For: nadel Summary of Call: Rsc nos from 1/30 to 2/7. Initial call taken by: Darletta Moll,  May 14, 2010 9:29 AM

## 2010-05-23 ENCOUNTER — Telehealth: Payer: Self-pay | Admitting: Pulmonary Disease

## 2010-05-29 ENCOUNTER — Telehealth (INDEPENDENT_AMBULATORY_CARE_PROVIDER_SITE_OTHER): Payer: Self-pay | Admitting: *Deleted

## 2010-05-29 ENCOUNTER — Other Ambulatory Visit: Payer: Self-pay | Admitting: Pulmonary Disease

## 2010-05-29 ENCOUNTER — Encounter (INDEPENDENT_AMBULATORY_CARE_PROVIDER_SITE_OTHER): Payer: Self-pay | Admitting: *Deleted

## 2010-05-29 ENCOUNTER — Other Ambulatory Visit: Payer: Medicare PPO

## 2010-05-29 DIAGNOSIS — R7309 Other abnormal glucose: Secondary | ICD-10-CM

## 2010-05-29 DIAGNOSIS — G4720 Circadian rhythm sleep disorder, unspecified type: Secondary | ICD-10-CM

## 2010-05-29 DIAGNOSIS — E039 Hypothyroidism, unspecified: Secondary | ICD-10-CM

## 2010-05-29 DIAGNOSIS — E7800 Pure hypercholesterolemia, unspecified: Secondary | ICD-10-CM

## 2010-05-29 DIAGNOSIS — I10 Essential (primary) hypertension: Secondary | ICD-10-CM

## 2010-05-29 DIAGNOSIS — R748 Abnormal levels of other serum enzymes: Secondary | ICD-10-CM

## 2010-05-29 LAB — CBC WITH DIFFERENTIAL/PLATELET
Basophils Absolute: 0 10*3/uL (ref 0.0–0.1)
Eosinophils Absolute: 0.1 10*3/uL (ref 0.0–0.7)
Hemoglobin: 12.4 g/dL (ref 12.0–15.0)
Lymphocytes Relative: 33.1 % (ref 12.0–46.0)
Monocytes Relative: 7.7 % (ref 3.0–12.0)
Neutro Abs: 2.6 10*3/uL (ref 1.4–7.7)
Neutrophils Relative %: 56.4 % (ref 43.0–77.0)
RBC: 4.05 Mil/uL (ref 3.87–5.11)
RDW: 14.2 % (ref 11.5–14.6)

## 2010-05-29 LAB — HEPATIC FUNCTION PANEL
AST: 17 U/L (ref 0–37)
Albumin: 4.1 g/dL (ref 3.5–5.2)
Alkaline Phosphatase: 45 U/L (ref 39–117)
Bilirubin, Direct: 0.1 mg/dL (ref 0.0–0.3)

## 2010-05-29 LAB — LIPID PANEL
HDL: 67.1 mg/dL (ref >39.00)
Total CHOL/HDL Ratio: 2
Triglycerides: 58 mg/dL (ref 0.0–149.0)
VLDL: 11.6 mg/dL (ref 0.0–40.0)

## 2010-05-29 LAB — BASIC METABOLIC PANEL
Calcium: 9.1 mg/dL (ref 8.4–10.5)
Creatinine, Ser: 1 mg/dL (ref 0.4–1.2)
GFR: 73.13 mL/min (ref >60.00)
Sodium: 138 mEq/L (ref 135–145)
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
## 2010-05-30 NOTE — Progress Notes (Signed)
Summary: Verify prescription  Phone Note From Pharmacy Call back at [phone]   Caller: Andrey Campanile at [facility] Summary of Call: Dr. Kriste Basque prescribed metoprolol succinate (extended release) but patient has been on same dose of cartrate.  Pharmacist wants to verify that Dr. Kriste Basque wants her on same dose, which is half tablet.  Initial call taken by: Leonette Monarch,  May 23, 2010 4:53 PM  Follow-up for Phone Call        Pt has been getting metoprolol tartrate from pharmacy since 2008, but refill was sent for metoprolol succinate at last OV. Pt chart is not clear on what med she should be on. Spoke with SN and he advised she shoudl stay on metoprolol tartrate sicne this is what she has been getting. I advised pharmacy of this and updated pt med list. Carron Curie CMA  May 23, 2010 5:27 PM     New/Updated Medications: METOPROLOL TARTRATE 50 MG TABS (METOPROLOL TARTRATE) One tablet in am and 1/2 tablet in pm

## 2010-06-05 NOTE — Progress Notes (Signed)
Summary: returning a call > to call with lab results once reviewed  Phone Note Call from Patient   Caller: Patient Call For: nadel Summary of Call: patient phoned stated that she had a message to call our office. she can be reached at [phone] if she doesnt answer you can leave a message.  Initial call taken by: Vedia Coffer,  May 29, 2010 11:26 AM  Follow-up for Phone Call        [name] did you call this pt about lab results? Pelase advsie.Carron Curie CMA  May 29, 2010 12:52 PM   i have no results to give pt as of yet.  SN has not reviewed her lab results but once he does i will give her a call.  thanks Randell Loop CMA  May 29, 2010 3:26 PM   called spoke with patient, informed pt that her lab results have not been reviewed but she will receive a call once he does.  pt okay with this and verbalized her understanding. Boone Master CNA/MA  May 29, 2010 3:37 PM

## 2010-06-10 ENCOUNTER — Other Ambulatory Visit: Payer: Medicare PPO

## 2010-06-10 ENCOUNTER — Encounter (INDEPENDENT_AMBULATORY_CARE_PROVIDER_SITE_OTHER): Payer: Self-pay | Admitting: *Deleted

## 2010-06-10 ENCOUNTER — Other Ambulatory Visit: Payer: Self-pay | Admitting: Pulmonary Disease

## 2010-06-10 DIAGNOSIS — I808 Phlebitis and thrombophlebitis of other sites: Secondary | ICD-10-CM

## 2010-06-11 ENCOUNTER — Telehealth: Payer: Self-pay | Admitting: Adult Health

## 2010-06-20 NOTE — Progress Notes (Signed)
Summary: pt calling for coumidin results  Phone Note Call from Patient Call back at Home Phone [phone]   Caller: Patient Call For: nadel Summary of Call: patient phoned stated that she was calling for to get the results of her coumidin.  She can be reached at [phone] if she isnt home please leave a message. Initial call taken by: Vedia Coffer,  June 11, 2010 5:31 PM  Follow-up for Phone Call        Pls advise thanks Vernie Murders  June 11, 2010 5:33 PM  per TP: looks good.  cont same dosing and f/u in 1 month for repeat PT/INR. Boone Master CNA/MA  June 11, 2010 5:38 PM   Pt informed of above. appointment placed in EPIC. Zackery Barefoot CMA  June 12, 2010 10:36 AM

## 2010-07-07 LAB — PROTIME-INR
INR: 2.38 — ABNORMAL HIGH (ref 0.00–1.49)
INR: 2.49 — ABNORMAL HIGH (ref 0.00–1.49)
Prothrombin Time: 25.8 seconds — ABNORMAL HIGH (ref 11.6–15.2)
Prothrombin Time: 26.7 seconds — ABNORMAL HIGH (ref 11.6–15.2)

## 2010-07-07 LAB — CBC
HCT: 37.5 % (ref 36.0–46.0)
Hemoglobin: 12.3 g/dL (ref 12.0–15.0)
MCHC: 32.1 g/dL (ref 30.0–36.0)
MCHC: 32.3 g/dL (ref 30.0–36.0)
MCV: 91.9 fL (ref 78.0–100.0)
MCV: 92.4 fL (ref 78.0–100.0)
Platelets: 217 10*3/uL (ref 150–400)
RBC: 4.15 MIL/uL (ref 3.87–5.11)
RDW: 13.1 % (ref 11.5–15.5)
WBC: 5.1 10*3/uL (ref 4.0–10.5)

## 2010-07-07 LAB — BASIC METABOLIC PANEL
BUN: 8 mg/dL (ref 6–23)
CO2: 28 mEq/L (ref 19–32)
Chloride: 103 mEq/L (ref 96–112)
Chloride: 106 mEq/L (ref 96–112)
Creatinine, Ser: 0.89 mg/dL (ref 0.4–1.2)
GFR calc Af Amer: >60 mL/min (ref >60)
Glucose, Bld: 109 mg/dL — ABNORMAL HIGH (ref 70–99)
Sodium: 135 mEq/L (ref 135–145)

## 2010-07-07 LAB — POCT CARDIAC MARKERS: Troponin i, poc: <0.05 ng/mL (ref 0.00–0.09)

## 2010-07-07 LAB — DIFFERENTIAL
Basophils Absolute: 0 10*3/uL (ref 0.0–0.1)
Basophils Relative: 0 % (ref 0–1)
Basophils Relative: 1 % (ref 0–1)
Eosinophils Absolute: 0.1 10*3/uL (ref 0.0–0.7)
Eosinophils Absolute: 0.2 10*3/uL (ref 0.0–0.7)
Eosinophils Relative: 3 % (ref 0–5)
Lymphs Abs: 1.5 10*3/uL (ref 0.7–4.0)
Monocytes Absolute: 0.5 10*3/uL (ref 0.1–1.0)
Monocytes Relative: 9 % (ref 3–12)
Neutrophils Relative %: 57 % (ref 43–77)

## 2010-07-10 ENCOUNTER — Other Ambulatory Visit: Payer: Self-pay | Admitting: *Deleted

## 2010-07-10 DIAGNOSIS — I808 Phlebitis and thrombophlebitis of other sites: Secondary | ICD-10-CM

## 2010-07-11 ENCOUNTER — Other Ambulatory Visit (INDEPENDENT_AMBULATORY_CARE_PROVIDER_SITE_OTHER): Payer: Medicare PPO

## 2010-07-11 DIAGNOSIS — I808 Phlebitis and thrombophlebitis of other sites: Secondary | ICD-10-CM

## 2010-07-11 DIAGNOSIS — Z7901 Long term (current) use of anticoagulants: Secondary | ICD-10-CM

## 2010-07-11 LAB — PROTIME-INR
INR: 2 ratio — ABNORMAL HIGH (ref 0.8–1.0)
Prothrombin Time: 21.3 s — ABNORMAL HIGH (ref 10.2–12.4)

## 2010-07-15 ENCOUNTER — Other Ambulatory Visit: Payer: Self-pay | Admitting: Pulmonary Disease

## 2010-07-15 ENCOUNTER — Telehealth: Payer: Self-pay | Admitting: Pulmonary Disease

## 2010-07-15 DIAGNOSIS — I8000 Phlebitis and thrombophlebitis of superficial vessels of unspecified lower extremity: Secondary | ICD-10-CM

## 2010-07-15 NOTE — Telephone Encounter (Signed)
Leigh did you try to contact this pt?

## 2010-07-15 NOTE — Telephone Encounter (Signed)
Sandy with [facility] on [address] phoned she needs specific directions for test strips insurance will not accept test as directed she can be reached at [phone].Vedia Coffer

## 2010-07-15 NOTE — Telephone Encounter (Signed)
Called and spoke with pt and she is aware of the test strips that have been sent to her pharmacy---spoke with pt about her protime results---per SN-will keep the same dosing  coumdain 5mg    1 tablet x 6 days and 1/2 tablet on Monday.  Pt voiced her understanding of this and standing lab order has been placed.

## 2010-07-17 NOTE — Telephone Encounter (Signed)
Pt called and states that [facility] on [address] is still waiting for nurse to call re: info for test strips. Pt # [id number] or cell [phone]. Tivis Ringer

## 2010-07-17 NOTE — Telephone Encounter (Signed)
Spoke w/ Andrey Campanile and advised her of new sig for test strips. Sandy verbalized understanding and needed nothing else

## 2010-07-17 NOTE — Telephone Encounter (Signed)
For test strips you  Can put on the sig---test as directed once daily. thanks

## 2010-07-17 NOTE — Telephone Encounter (Signed)
Spoke with [place] pharmacist at [facility]. She states pt insurance company will not accept the directions for pt's test strips stating test as directed. Andrey Campanile states they are needing a a sig stating what is the maximum pt can test a day. Please advise Dr. Kriste Basque Thanks  Carver Fila, CMA

## 2010-07-21 ENCOUNTER — Emergency Department (HOSPITAL_COMMUNITY): Payer: Medicare HMO

## 2010-07-21 ENCOUNTER — Inpatient Hospital Stay (INDEPENDENT_AMBULATORY_CARE_PROVIDER_SITE_OTHER)
Admission: RE | Admit: 2010-07-21 | Discharge: 2010-07-21 | Disposition: A | Discharge status: Home / Self Care | Payer: Medicare PPO | Source: Ambulatory Visit | Attending: Emergency Medicine | Admitting: Emergency Medicine

## 2010-07-21 ENCOUNTER — Other Ambulatory Visit: Payer: Self-pay | Admitting: Adult Health

## 2010-07-21 ENCOUNTER — Observation Stay (HOSPITAL_COMMUNITY)
Admission: EM | Admit: 2010-07-21 | Discharge: 2010-07-23 | Disposition: A | Discharge status: Home / Self Care | Payer: Medicare HMO | Attending: Internal Medicine | Admitting: Internal Medicine

## 2010-07-21 DIAGNOSIS — I498 Other specified cardiac arrhythmias: Secondary | ICD-10-CM | POA: Insufficient documentation

## 2010-07-21 DIAGNOSIS — R0989 Other specified symptoms and signs involving the circulatory and respiratory systems: Secondary | ICD-10-CM | POA: Insufficient documentation

## 2010-07-21 DIAGNOSIS — E559 Vitamin D deficiency, unspecified: Secondary | ICD-10-CM | POA: Insufficient documentation

## 2010-07-21 DIAGNOSIS — R079 Chest pain, unspecified: Secondary | ICD-10-CM

## 2010-07-21 DIAGNOSIS — I10 Essential (primary) hypertension: Secondary | ICD-10-CM | POA: Insufficient documentation

## 2010-07-21 DIAGNOSIS — R0609 Other forms of dyspnea: Secondary | ICD-10-CM | POA: Insufficient documentation

## 2010-07-21 DIAGNOSIS — E119 Type 2 diabetes mellitus without complications: Secondary | ICD-10-CM | POA: Insufficient documentation

## 2010-07-21 DIAGNOSIS — I059 Rheumatic mitral valve disease, unspecified: Secondary | ICD-10-CM | POA: Insufficient documentation

## 2010-07-21 DIAGNOSIS — I82409 Acute embolism and thrombosis of unspecified deep veins of unspecified lower extremity: Secondary | ICD-10-CM | POA: Insufficient documentation

## 2010-07-21 DIAGNOSIS — M810 Age-related osteoporosis without current pathological fracture: Secondary | ICD-10-CM | POA: Insufficient documentation

## 2010-07-21 DIAGNOSIS — R61 Generalized hyperhidrosis: Secondary | ICD-10-CM | POA: Insufficient documentation

## 2010-07-21 DIAGNOSIS — Z7901 Long term (current) use of anticoagulants: Secondary | ICD-10-CM | POA: Insufficient documentation

## 2010-07-21 LAB — CBC
Hemoglobin: 12.6 g/dL (ref 12.0–15.0)
MCH: 29.3 pg (ref 26.0–34.0)
MCV: 88.4 fL (ref 78.0–100.0)
Platelets: 194 10*3/uL (ref 150–400)
RBC: 4.3 MIL/uL (ref 3.87–5.11)
WBC: 5.4 10*3/uL (ref 4.0–10.5)

## 2010-07-21 LAB — DIFFERENTIAL
Basophils Relative: 0 % (ref 0–1)
Eosinophils Absolute: 0.1 10*3/uL (ref 0.0–0.7)
Lymphs Abs: 1.8 10*3/uL (ref 0.7–4.0)
Monocytes Relative: 8 % (ref 3–12)
Neutro Abs: 3 10*3/uL (ref 1.7–7.7)
Neutrophils Relative %: 56 % (ref 43–77)

## 2010-07-21 LAB — POCT I-STAT, CHEM 8
BUN: 12 mg/dL (ref 6–23)
Calcium, Ion: 1.11 mmol/L — ABNORMAL LOW (ref 1.12–1.32)
Chloride: 105 mEq/L (ref 96–112)
HCT: 40 % (ref 36.0–46.0)
Sodium: 141 mEq/L (ref 135–145)

## 2010-07-21 LAB — POCT CARDIAC MARKERS: Myoglobin, poc: 47.2 ng/mL (ref 12–200)

## 2010-07-21 LAB — APTT: aPTT: 30 seconds (ref 24–37)

## 2010-07-21 LAB — PROTIME-INR: Prothrombin Time: 18.6 seconds — ABNORMAL HIGH (ref 11.6–15.2)

## 2010-07-22 ENCOUNTER — Inpatient Hospital Stay (HOSPITAL_COMMUNITY): Payer: Medicare HMO

## 2010-07-22 LAB — CBC
HCT: 38.9 % (ref 36.0–46.0)
Hemoglobin: 12.7 g/dL (ref 12.0–15.0)
MCV: 88 fL (ref 78.0–100.0)
WBC: 4.7 10*3/uL (ref 4.0–10.5)

## 2010-07-22 LAB — HEPARIN LEVEL (UNFRACTIONATED): Heparin Unfractionated: 1.86 IU/mL — ABNORMAL HIGH (ref 0.30–0.70)

## 2010-07-22 LAB — BASIC METABOLIC PANEL
CO2: 27 mEq/L (ref 19–32)
Chloride: 109 mEq/L (ref 96–112)
GFR calc Af Amer: >60 mL/min (ref >60)
Glucose, Bld: 128 mg/dL — ABNORMAL HIGH (ref 70–99)
Potassium: 3.7 mEq/L (ref 3.5–5.1)
Sodium: 142 mEq/L (ref 135–145)

## 2010-07-22 LAB — LIPID PANEL
Cholesterol: 159 mg/dL (ref 0–200)
LDL Cholesterol: 74 mg/dL (ref 0–99)
VLDL: 6 mg/dL (ref 0–40)

## 2010-07-22 LAB — CARDIAC PANEL(CRET KIN+CKTOT+MB+TROPI)
CK, MB: 1.4 ng/mL (ref 0.3–4.0)
CK, MB: 1.4 ng/mL (ref 0.3–4.0)
Relative Index: 1.1 (ref 0.0–2.5)
Relative Index: 1.1 (ref 0.0–2.5)
Total CK: 133 U/L (ref 7–177)

## 2010-07-22 LAB — TROPONIN I: Troponin I: 0.02 ng/mL (ref 0.00–0.06)

## 2010-07-22 MED ORDER — TECHNETIUM TC 99M TETROFOSMIN IV KIT
30.0000 | PACK | Freq: Once | INTRAVENOUS | Status: AC | PRN
  Administered 2010-07-22: 30 via INTRAVENOUS

## 2010-07-22 MED ORDER — TECHNETIUM TC 99M TETROFOSMIN IV KIT
10.0000 | PACK | Freq: Once | INTRAVENOUS | Status: AC | PRN
  Administered 2010-07-22: 10 via INTRAVENOUS

## 2010-07-23 ENCOUNTER — Other Ambulatory Visit (HOSPITAL_COMMUNITY): Payer: Medicare PPO

## 2010-07-23 LAB — PROTIME-INR: INR: 1.87 — ABNORMAL HIGH (ref 0.00–1.49)

## 2010-07-23 LAB — CBC
Platelets: 196 10*3/uL (ref 150–400)
RBC: 4.39 MIL/uL (ref 3.87–5.11)
WBC: 7.3 10*3/uL (ref 4.0–10.5)

## 2010-07-23 LAB — HEPARIN LEVEL (UNFRACTIONATED): Heparin Unfractionated: 1.44 IU/mL — ABNORMAL HIGH (ref 0.30–0.70)

## 2010-07-25 ENCOUNTER — Inpatient Hospital Stay (HOSPITAL_COMMUNITY)
Admission: EM | Admit: 2010-07-25 | Discharge: 2010-07-29 | DRG: 287 | Disposition: A | Discharge status: Home / Self Care | Payer: Medicare HMO | Attending: Cardiovascular Disease | Admitting: Cardiovascular Disease

## 2010-07-25 ENCOUNTER — Emergency Department (HOSPITAL_COMMUNITY): Payer: Medicare HMO

## 2010-07-25 DIAGNOSIS — Z79899 Other long term (current) drug therapy: Secondary | ICD-10-CM

## 2010-07-25 DIAGNOSIS — I2510 Atherosclerotic heart disease of native coronary artery without angina pectoris: Principal | ICD-10-CM | POA: Diagnosis present

## 2010-07-25 DIAGNOSIS — E119 Type 2 diabetes mellitus without complications: Secondary | ICD-10-CM | POA: Diagnosis present

## 2010-07-25 DIAGNOSIS — I4891 Unspecified atrial fibrillation: Secondary | ICD-10-CM | POA: Diagnosis present

## 2010-07-25 DIAGNOSIS — I200 Unstable angina: Secondary | ICD-10-CM | POA: Diagnosis present

## 2010-07-25 DIAGNOSIS — I495 Sick sinus syndrome: Secondary | ICD-10-CM | POA: Diagnosis present

## 2010-07-25 DIAGNOSIS — Z86718 Personal history of other venous thrombosis and embolism: Secondary | ICD-10-CM

## 2010-07-25 DIAGNOSIS — M810 Age-related osteoporosis without current pathological fracture: Secondary | ICD-10-CM | POA: Diagnosis present

## 2010-07-25 DIAGNOSIS — Z7901 Long term (current) use of anticoagulants: Secondary | ICD-10-CM

## 2010-07-25 DIAGNOSIS — I10 Essential (primary) hypertension: Secondary | ICD-10-CM | POA: Diagnosis present

## 2010-07-25 DIAGNOSIS — Z7982 Long term (current) use of aspirin: Secondary | ICD-10-CM

## 2010-07-25 LAB — CBC
HCT: 38.5 % (ref 36.0–46.0)
Hemoglobin: 12.8 g/dL (ref 12.0–15.0)
MCH: 28.9 pg (ref 26.0–34.0)
MCHC: 33.3 g/dL (ref 30.0–36.0)
MCV: 91.4 fL (ref 78.0–100.0)
Platelets: 183 10*3/uL (ref 150–400)
RBC: 4.29 MIL/uL (ref 3.87–5.11)
RDW: 14.1 % (ref 11.5–15.5)
WBC: 4.2 10*3/uL (ref 4.0–10.5)

## 2010-07-25 LAB — BASIC METABOLIC PANEL
BUN: 11 mg/dL (ref 6–23)
Chloride: 105 mEq/L (ref 96–112)
Creatinine, Ser: 0.93 mg/dL (ref 0.4–1.2)
Glucose, Bld: 130 mg/dL — ABNORMAL HIGH (ref 70–99)

## 2010-07-25 LAB — PROTIME-INR: Prothrombin Time: 23.9 seconds — ABNORMAL HIGH (ref 11.6–15.2)

## 2010-07-25 LAB — CK TOTAL AND CKMB (NOT AT ARMC)
CK, MB: 1.4 ng/mL (ref 0.3–4.0)
Relative Index: 1.2 (ref 0.0–2.5)
Total CK: 120 U/L (ref 7–177)

## 2010-07-25 LAB — POCT CARDIAC MARKERS
Troponin i, poc: <0.05 ng/mL (ref 0.00–0.09)
Troponin i, poc: <0.05 ng/mL (ref 0.00–0.09)

## 2010-07-25 LAB — CARDIAC PANEL(CRET KIN+CKTOT+MB+TROPI)
CK, MB: 1.1 ng/mL (ref 0.3–4.0)
Relative Index: 1.1 (ref 0.0–2.5)
Total CK: 100 U/L (ref 7–177)

## 2010-07-25 LAB — POCT I-STAT, CHEM 8
BUN: 8 mg/dL (ref 6–23)
Creatinine, Ser: 1 mg/dL (ref 0.4–1.2)
Potassium: 4.3 mEq/L (ref 3.5–5.1)
Sodium: 139 mEq/L (ref 135–145)

## 2010-07-25 LAB — DIFFERENTIAL
Basophils Absolute: 0 10*3/uL (ref 0.0–0.1)
Basophils Relative: 0 % (ref 0–1)
Basophils Relative: 1 % (ref 0–1)
Eosinophils Absolute: 0 10*3/uL (ref 0.0–0.7)
Eosinophils Absolute: 0.1 10*3/uL (ref 0.0–0.7)
Eosinophils Relative: 2 % (ref 0–5)
Monocytes Absolute: 0.4 10*3/uL (ref 0.1–1.0)
Monocytes Relative: 8 % (ref 3–12)
Neutrophils Relative %: 66 % (ref 43–77)

## 2010-07-25 LAB — BRAIN NATRIURETIC PEPTIDE: Pro B Natriuretic peptide (BNP): 60 pg/mL (ref 0.0–100.0)

## 2010-07-25 LAB — TROPONIN I: Troponin I: 0.01 ng/mL (ref 0.00–0.06)

## 2010-07-26 LAB — CARDIAC PANEL(CRET KIN+CKTOT+MB+TROPI)
Relative Index: INVALID (ref 0.0–2.5)
Total CK: 82 U/L (ref 7–177)
Troponin I: 0.02 ng/mL (ref 0.00–0.06)

## 2010-07-26 LAB — URINE MICROSCOPIC-ADD ON

## 2010-07-26 LAB — URINALYSIS, ROUTINE W REFLEX MICROSCOPIC
Bilirubin Urine: NEGATIVE
Ketones, ur: NEGATIVE mg/dL
Nitrite: NEGATIVE
Specific Gravity, Urine: 1.008 (ref 1.005–1.030)
pH: 6 (ref 5.0–8.0)

## 2010-07-26 LAB — CBC
Hemoglobin: 10.9 g/dL — ABNORMAL LOW (ref 12.0–15.0)
MCH: 28.8 pg (ref 26.0–34.0)
MCHC: 32.8 g/dL (ref 30.0–36.0)
Platelets: 181 10*3/uL (ref 150–400)
RBC: 3.78 MIL/uL — ABNORMAL LOW (ref 3.87–5.11)

## 2010-07-26 LAB — BASIC METABOLIC PANEL
CO2: 28 mEq/L (ref 19–32)
Calcium: 8.9 mg/dL (ref 8.4–10.5)
Chloride: 102 mEq/L (ref 96–112)
Creatinine, Ser: 0.99 mg/dL (ref 0.4–1.2)
GFR calc Af Amer: >60 mL/min (ref >60)
Sodium: 135 mEq/L (ref 135–145)

## 2010-07-26 LAB — PROTIME-INR: INR: 2.24 — ABNORMAL HIGH (ref 0.00–1.49)

## 2010-07-27 LAB — PROTIME-INR
INR: 2.06 — ABNORMAL HIGH (ref 0.00–1.49)
Prothrombin Time: 23.4 seconds — ABNORMAL HIGH (ref 11.6–15.2)

## 2010-07-28 LAB — CBC
MCHC: 33 g/dL (ref 30.0–36.0)
Platelets: 196 10*3/uL (ref 150–400)
RDW: 13.2 % (ref 11.5–15.5)
WBC: 6.1 10*3/uL (ref 4.0–10.5)

## 2010-07-28 LAB — BASIC METABOLIC PANEL
BUN: 4 mg/dL — ABNORMAL LOW (ref 6–23)
Creatinine, Ser: 0.9 mg/dL (ref 0.4–1.2)
GFR calc non Af Amer: >60 mL/min (ref >60)
Glucose, Bld: 121 mg/dL — ABNORMAL HIGH (ref 70–99)
Potassium: 3.6 mEq/L (ref 3.5–5.1)

## 2010-07-28 LAB — HEPARIN LEVEL (UNFRACTIONATED)
Heparin Unfractionated: 0.65 IU/mL (ref 0.30–0.70)
Heparin Unfractionated: 1.3 IU/mL — ABNORMAL HIGH (ref 0.30–0.70)

## 2010-07-28 LAB — PROTIME-INR
INR: 1.72 — ABNORMAL HIGH (ref 0.00–1.49)
Prothrombin Time: 20.3 seconds — ABNORMAL HIGH (ref 11.6–15.2)

## 2010-07-29 LAB — BASIC METABOLIC PANEL
BUN: 7 mg/dL (ref 6–23)
CO2: 28 mEq/L (ref 19–32)
Calcium: 9.2 mg/dL (ref 8.4–10.5)
Chloride: 101 mEq/L (ref 96–112)
Creatinine, Ser: 1 mg/dL (ref 0.4–1.2)

## 2010-07-29 LAB — CBC
MCH: 29 pg (ref 26.0–34.0)
MCHC: 33.1 g/dL (ref 30.0–36.0)
MCV: 87.7 fL (ref 78.0–100.0)
Platelets: 209 10*3/uL (ref 150–400)
RBC: 4.07 MIL/uL (ref 3.87–5.11)

## 2010-07-29 LAB — GLUCOSE, CAPILLARY: Glucose-Capillary: 113 mg/dL — ABNORMAL HIGH (ref 70–99)

## 2010-07-29 LAB — HEPARIN LEVEL (UNFRACTIONATED): Heparin Unfractionated: 0.91 IU/mL — ABNORMAL HIGH (ref 0.30–0.70)

## 2010-07-29 LAB — PROTIME-INR
INR: 1.2 (ref 0.00–1.49)
Prothrombin Time: 15.4 seconds — ABNORMAL HIGH (ref 11.6–15.2)

## 2010-07-30 HISTORY — PX: CARDIAC CATHETERIZATION: SHX172

## 2010-07-31 NOTE — Discharge Summary (Signed)
  [name], [name]              ACCOUNT NO.:  [id number]  MEDICAL RECORD NO.:  [id number]           PATIENT TYPE:  I  LOCATION:  2040                         FACILITY:  [facility]  PHYSICIAN:  [place] Jabez Molner, MD         DATE OF BIRTH:  [birth year]  DATE OF ADMISSION:  07/25/2010 DATE OF DISCHARGE:  07/29/2010                              DISCHARGE SUMMARY   DISCHARGE DIAGNOSES: 1. Unstable angina, catheterization this admission revealing moderate     coronary disease in the left anterior descending, to be treated     medically. 2. Preserved left ventricular function at catheterization this     admission. 3. History of paroxysmal atrial fibrillation and paroxysmal     supraventricular tachycardia, on Coumadin prior to admission,     resumed at discharge without heparin or Lovenox crossover. 4. Sick sinus syndrome with brady-tachy syndrome, beta-blocker     adjusted this admission.  HOSPITAL COURSE:  The patient is a pleasant 76 year old female recently admitted earlier this month with chest pain.  She had a Myoview which was negative for ischemia.  She had some documented SVT during that admission and also some bradycardia and her beta blockers were cut back. She was discharged and went home and had some palpitations and chest pain on the morning of admission, worrisome for unstable angina.  See history and physical for complete details.  She presented to the emergency room and was admitted for further evaluation.  Enzymes were negative.  INR was 2.24 and her Coumadin was held.  She was put on heparin when her INR was less than 2.  The patient was taken to cath lab today on July 29, 2010.  Catheterization revealed a 50-60% LAD at the takeoff of the second diagonal and 80% distal LAD.  Plan is for medical therapy.  The RCA had a 30-40% narrowing.  Circumflex and left main and OMs were normal.  She had an EF of 65%.  We feel she can be discharged this afternoon and started back on  Coumadin without Lovenox or heparin crossover as she has been in sinus rhythm during this hospitalization. TSH 1.50.  CK-MB and troponins were negative.  Hemoglobin A1c is 6.3. Cholesterol 159, HDL 79, and LDL 74.  White count 6.1, hemoglobin 11.1, hematocrit 33.6, and platelets 196.  Chest x-ray shows no acute process. EKG shows sinus rhythm, minimal voltage criteria for LVH, and nonspecific ST changes.  DISPOSITION:  The patient will be discharged in stable condition and will follow up with Dr. Rennis Golden as an outpatient.  She will have an INR checked later this week.    Abelino Derrick, P.A.   ______________________________ [place] Lizandro Spellman, MD   LKK/MEDQ  D:  07/29/2010  T:  07/30/2010  Job:  [id number]  cc:   Lonzo Cloud. Kriste Basque, MD  Electronically Signed by Corine Shelter P.A. on 07/30/2010 03:44:43 PM Electronically Signed by Kirtland Bouchard. Debra Colon M.D. on 07/31/2010 03:30:20 PM

## 2010-08-01 ENCOUNTER — Other Ambulatory Visit (INDEPENDENT_AMBULATORY_CARE_PROVIDER_SITE_OTHER): Payer: Medicare HMO

## 2010-08-01 DIAGNOSIS — I8000 Phlebitis and thrombophlebitis of superficial vessels of unspecified lower extremity: Secondary | ICD-10-CM

## 2010-08-01 LAB — PROTIME-INR
INR: 1.1 ratio — ABNORMAL HIGH (ref 0.8–1.0)
Prothrombin Time: 12.7 s — ABNORMAL HIGH (ref 10.2–12.4)

## 2010-08-01 NOTE — Cardiovascular Report (Signed)
[name], [name]              ACCOUNT NO.:  [id number]  MEDICAL RECORD NO.:  [id number]           PATIENT TYPE:  LOCATION:                                 FACILITY:  PHYSICIAN:  Landry Corporal, MD DATE OF BIRTH:  28-Feb-1935  DATE OF PROCEDURE: DATE OF DISCHARGE:                           CARDIAC CATHETERIZATION   PRIMARY CARDIOLOGIST:  Italy Hilty, MD  PROCEDURE PERFORMED: 1. Left heart catheterization via 5-French right femoral artery access     site. 2. Left ventriculogram at 10 mL per second for 30 seconds in the RAO     projection. 3. Native coronary angiography.  INDICATIONS:  Chest pain and arrhythmia on stress test.  [name] is a very pleasant 76 year old woman who was recently admitted to [facility] for palpitations and chest discomfort.  She underwent Lexiscan Cardiolite which did not show evidence of any significant ischemic disease, but she did have a relatively short run of presumed SVT during the administration of Lexiscan.  Unfortunately, we did not have the EKGs to review this.  She was discharged in stable condition on a low-dose beta-blocker; however, she will be admitted with chest pain, and given the recent negative stress test decision was made to proceed with diagnostic cardiac catheterization.  Her warfarin that she has been on for previous DVT was held in anticipation of cardiac catheterization.  The risks, benefits, alternatives, indications for procedure were explained to the patient in detail.  Informed consent was obtained and signed form placed on the chart as the patient agreed to proceed with the procedure.  PROCEDURE:  The patient was brought to the second floor of [facility] in the fasting state.  She was prepped and draped in usual sterile fashion.  A time-out period was formed, and after a modified Allen/Barbeau test was performed on the right wrist it was found to have not sufficiently adequate  collateral circulation via the right ulnar artery.  The patient's femoral access site was prepped. The right femoral head was then localized using tactile and fluoroscopic guidance and the right groin was anesthetized using 1% lidocaine.  The right common femoral artery was then accessed using the modified Seldinger technique with placement of 5-French sheath.  The sheath was aspirated and flushed, and first a 5-French JL-4 followed by 5-French JR- 4 catheter advanced over the wire.  Multiple angiographic views of first left and right coronary systems were obtained.  The catheter was exchanged over a wire for a 5-French angled pigtail catheter which was used to advance across the aortic valve.  Left ventricular hemodynamics were then measured, and then a right ventriculogram was performed in the RAO projection at 10 mL of contrast per second for 30 seconds.  After the completion of LV gram, left ventricular hemodynamics were then remeasured and the catheter was pulled back across the aortic valve for measurement of pullback gradient.  The catheter was then removed completely out of the body over a wire.  There were no complications. Estimated blood loss less than 10 mL.  The patient was stable before, during, and after the procedure.  The patient was  then transferred to the cath lab holding area where the sheath was removed with manual pressure for hemostasis.  The patient was then transported to the postcatheterization procedure suite where she was monitored until discharge.  There were no complications.  The patient was stable before, during, and after the procedure.  CATHETERIZATION STATISTICS:  Versed 1 mg and 25 mcg fentanyl for sedation.  CONTRAST:  Total of 90 mL.  HEMODYNAMIC RESULTS: 1. Left ventricular pressure is 153/2 mmHg, EDP of 6 mmHg. 2. Central aortic pressure 154/75 mmHg with a mean pressure of 110     mmHg. 3. Left ventriculogram showed ejection fraction of  roughly 65% with no     wall motion abnormalities.  ANGIOGRAPHIC FINDINGS: 1. Left main is a moderate to large caliber vessel which bifurcates     into LAD and circumflex.  There is no significant disease in this     vessel. 2. The LAD is again moderate to large caliber vessel.  It reached down     around the apex.  There is a proximal diagonal and then one another     second diagonal branch there is a kind of prestenotic dilatation     with a step-down to about 50%, may be 60% at worst, in one view     lesion at this diagonal takeoff site.  The vessel then continues on     and gives rise to yet again another third diagonal branch.  Just     distal to that in the very distal portion of the vessel before the     apex is a roughly 40% lesion.  None of the diagonals itself have     any disease. 3. The circumflex artery is somewhat tortuous in the proximal     distribution and gives rise to two small proximal obtuse marginals     and it bifurcates into a large third obtuse marginal and intra-     articular groove artery which becomes a posterolateral branch.  No     significant disease in circumflex vessel. 4. Right coronary artery is a large dominant vessel.  It gives rise to     posterior interventricular groove artery which has then three     posterolateral branches and AV-nodal artery.  It also gives rise to     posterior descending artery.  None of the most distal branches have     any significant disease.  The RCA itself has a proximal 30% to 40%     irregularities and then in the midportion there are several diffuse     areas of 20% luminal irregularities, but no significant     angiographic disease in the RCA system.  IMPRESSION: 1. Mild-to-moderate coronary artery disease, none explaining culprit     lesion for resting angina. 2. Normal ejection fraction.  RECOMMENDATIONS:  Continue to optimize medical therapy to potentially setup for her to wear a Holter monitor from our  clinic in order to evaluate if she has any arrhythmias as there was an ACT noted on her stress test and may very well be her palpitations and chest pressure symptoms.  Likely, anticipate discharge today.  Follow up with Dr. Rennis Golden in clinic.  We will restart her warfarin as previously administered.          ______________________________ Landry Corporal, MD     DWH/MEDQ  D:  07/30/2010  T:  07/31/2010  Job:  [id number]  cc:   Italy Hilty, MD [facility].  Electronically Signed by Bryan Lemma MD on 08/01/2010 07:13:36 PM

## 2010-08-02 ENCOUNTER — Telehealth: Payer: Self-pay | Admitting: Pulmonary Disease

## 2010-08-02 DIAGNOSIS — I8000 Phlebitis and thrombophlebitis of superficial vessels of unspecified lower extremity: Secondary | ICD-10-CM

## 2010-08-02 NOTE — Telephone Encounter (Signed)
Pls advise results of PT/INR- I printed and attached to this msg thanks!

## 2010-08-04 ENCOUNTER — Other Ambulatory Visit: Payer: Self-pay | Admitting: Adult Health

## 2010-08-05 NOTE — Discharge Summary (Signed)
NAME:  [name], [name]              ACCOUNT NO.:  [id number]  MEDICAL RECORD NO.:  [id number]           PATIENT TYPE:  I  LOCATION:  2030                         FACILITY:  [facility]  PHYSICIAN:  Yanelis Osika C.      DATE OF BIRTH:  05-Feb-1935  DATE OF ADMISSION:  07/21/2010 DATE OF DISCHARGE:  07/23/2010                              DISCHARGE SUMMARY   DISCHARGE DIAGNOSES: 1. Chest pain, negative myocardial infarction, negative nuclear stress     test. 2. Palpitations with supraventricular tachycardia during Lexiscan     Myoview. 3. Bradycardia with heart rate down to 39 during the night, but in the     40s while the patient awake and talking during hospitalization.     a.     Decrease of her metoprolol.     b.     Monitor to rule out tachybrady syndrome. 4. Deep vein thrombosis, on Coumadin. 5. Anticoagulation with Coumadin. 6. Hypertension, controlled. 7. Borderline Diabetes mellitus 2. 8. Osteoporosis.  DISCHARGE CONDITION:  Improved.  PROCEDURES:  None.  DISCHARGE MEDICATIONS:  See medication reconciliation sheet from [facility]. We did decrease her Toprol to 25 twice a day secondary to bradycardia. We also increased her Norvasc to 10 mg daily due to hypertension.  We also increased her Coumadin as she was subtherapeutic on arrival.  HOSPITAL COURSE:  This is a 76 year old patient of Dr. Jodelle Green and new for Dr. Rennis Golden, was admitted through the ER with complaints of fluttering in her chest and associated chest pain, not associated with exertion, worse at night.  She does snore, but no observed apnea.  She has dyspnea associated with her fluttering briefly.  She has had nonstress test in the past.  PAST MEDICAL HISTORY: 1. Hypertension. 2. Hyperlipidemia. 3. Borderline diabetes. 4. DVT. 5. Osteoporosis. 6. Vitamin D deficiency.  The patient was admitted.  Cardiac enzymes were negative.  Her INR on admission was 1.53.  She was placed on heparin and Coumadin dose  was increased by the hospital pharmacy.  By the next morning, she underwent Lexiscan Myoview and tolerated procedure well.  There was no ischemia on the results of the study.  She stabilized on April 10, 22012, and was ambulating.  Her INR was 1.87.  she was given 7.5 of Coumadin.  Her heparin was discontinued and given 1 injection of Lovenox.  She will follow up with her Coumadin Clinic appointment at [facility] on this Thursday, July 25, 2010, and she will follow up with Dr. Rennis Golden on July 30, 2010, at 4:12.  OTHER DISCHARGE INSTRUCTIONS:  Increase activity slowly.  Low-sodium, heart-healthy diet.  We recommend a heart monitor to wear for palpitations and slow heartbeat and she will receive that when she sees Dr. Rennis Golden on July 30, 2010.  She may need a 2-D echo as an outpatient and that will be arranged when Dr. Rennis Golden sees her.  LABORATORY DATA:  Hemoglobin 12.8, hematocrit 39, WBC 7.3, and platelets 196.  INR at discharge 1.87.  Chemistry:  Sodium 142, potassium 3.7, chloride 109, CO2 of 27, glucose 128, BUN 7, creatinine 0.90, and calcium 9.0.  Hemoglobin A1c 6.4.  CKs range 152, 133, 126.  MB 1.8, 1.4, 1.4.  Troponin-I 0.02-0.03. Negative MI.  Total cholesterol 159, triglycerides 30, HDL 79, and LDL 74.  Nuclear stress test, no perfusion defects.  EF was 78%.  Chest x-ray:  Minimal linear atelectasis or scarring of the left lung base, otherwise no abnormalities.  EKG:  Sinus rhythm, borderline AV conduction delay, left axis deviation, no acute changes. The patient was discharged with her son and will follow up as previously instructed.     Darcella Gasman. [place], N.P.   ______________________________ Chrystie Nose    LRI/MEDQ  D:  07/23/2010  T:  07/24/2010  Job:  [id number]  cc:   Lonzo Cloud. Kriste Basque, MD  Electronically Signed by Nada Boozer N.P. on 07/25/2010 10:24:55 AM Electronically Signed by Kirtland Bouchard. Dandy Lazaro M.D. on 08/05/2010 01:37:39 PM

## 2010-08-05 NOTE — Telephone Encounter (Signed)
Per SN---keep coumadin the same dose and repeat protime in 2 wks.   Order is already in system for pt. thanks

## 2010-08-05 NOTE — Telephone Encounter (Signed)
Called spoke with patient, advised of SN's recs.  Pt verbalized her understanding and will return for repeat PT/INR in 2 weeks.

## 2010-08-07 ENCOUNTER — Other Ambulatory Visit: Payer: Self-pay | Admitting: Pulmonary Disease

## 2010-08-07 ENCOUNTER — Telehealth: Payer: Self-pay | Admitting: Pulmonary Disease

## 2010-08-07 DIAGNOSIS — I8000 Phlebitis and thrombophlebitis of superficial vessels of unspecified lower extremity: Secondary | ICD-10-CM

## 2010-08-07 NOTE — H&P (Signed)
[name], [name]              ACCOUNT NO.:  [id number]  MEDICAL RECORD NO.:  [id number]           PATIENT TYPE:  E  LOCATION:  [facility]                         FACILITY:  [facility]  PHYSICIAN:  Nanetta Batty, M.D.   DATE OF BIRTH:  1935-01-21  DATE OF ADMISSION:  07/25/2010 DATE OF DISCHARGE:                             HISTORY & PHYSICAL   CHIEF COMPLAINT:  Chest pain.  HISTORY OF PRESENT ILLNESS:  [name] is a 76 year old female that was actually discharged on July 23, 2010 after presentation with chest pain.  She underwent a Lexiscan Myoview which was negative for ischemia. EF was stable and normal.  She did complain of palpitations at that time and did have SVT during Lexiscan.  She also has bradycardia with heart rates in the 30s to 40s and her metoprolol was decreased..  The patient went home yesterday, she had some palpitations fluttering feeling in her chest and then this morning she was awakened from sleep at 4:00 a.m. with left anterior chest pain, kind of pressure pain that was sharp.  No fluttering associated with it. She had no diaphoresis, mild queasiness and mild shortness of breath.  She took a nitroglycerin that we had given her discharge which helped.  She rested and that seemed to return little bit, she took her second nitroglycerin around 7 a.m.  Currently she is has mild discomfort.  The patient is on Coumadin for DVT an INR now is greater than 2.  PAST MEDICAL HISTORY:  Hypertension, diabetes, and osteoporosis.  ALLERGIES:  BIAXIN AND METFORMIN.  REVIEW OF SYSTEMS:  GENERAL:  No lightheadedness, dizziness.  No colds or fevers. GI: No diarrhea, constipation, or melena. GU: No hematuria or dysuria. NEURO:  No syncope. ENDOCRINE:  Glucose is stable.  OUTPATIENT MEDICATIONS:  Tylenol p.r.n., amlodipine 10 mg daily, nitroglycerin sublingual p.r.n., Coumadin 7-1/2 mg daily.  She was to go to Coumadin Clinic today, metoprolol XL to take 25 mg twice a  day, alprazolam 0.5 mg 1 tablet by mouth three times as needed, artificial tears 1-2 drops both eyes twice daily as needed, aspirin 81 mg daily, dicyclomine 20 mg 1 tablet by mouth every 6 hours as needed, Fosamax 10 mg 1 tablet weekly as needed, simvastatin 20 mg by mouth every other night, tramadol 50 mg three times day as needed and vitamin D 50,000 units one capsule by mouth.  SOCIAL HISTORY:  She does not use tobacco or alcohol.  FAMILY HISTORY:  Not significant to this admission.  Hemoglobin A1c previous admission was 6.4, total cholesterol was 159, triglycerides of 30, HDL 79, LDL 74, we will not repeat as these were just done this week.  PHYSICAL EXAMINATION:  VITAL SIGNS: Today blood pressure 149/68, pulse 74, respiratory rate 16, temperature 97.5,  oxygen saturation 100%. GENERAL:  Alert, oriented African American female in no acute distress, pleasant affect. SKIN:  Warm and dry, brisk capillary refill. HEENT: Normocephalic.  Sclerae clear. NECK:  Supple.  No JVD, no bruits, no thyromegaly. LUNGS:  Clear without rales or wheezes. CARDIOVASCULAR:  Heart S1, S2.  Regular rate and rhythm without murmur, gallop, rub, or click.  ABDOMEN:  Soft, nontender, positive bowel sounds.  I could not palpate liver, spleen, or masses. LOWER EXTREMITIES:  Without edema.  2+ pulses bilaterally. NEURO:  Alert and oriented x3.  Follows commands.  LABS:  Hemoglobin 12.4, hematocrit 37.5, WBC 4.2, platelets 183, myoglobin 40.3, CK-MB less than 1.0, troponin less than 0.05.  Chest x- ray no evidence of acute cardiopulmonary process.  INR was greater than 2.  EKG is sinus rhythm without acute changes from previous EKG.  IMPRESSION: 1. Chest pain most likely unstable angina.  Though negative nuclear     study recently, we plan for cardiac catheterization to 100% rule     out coronary artery disease. 2. Tachybradycardia  syndrome,  we have decreased Toprol. 3. Deep venous thrombosis on  Coumadin.  Once INR less than 2 we will     begin IV heparin.  We will hold Coumadin for cardiac     catheterization. 4. Continue blood pressure medications.  Dr. Allyson Sabal has seen the patient.     Darcella Gasman. Annie Paras, N.P.   ______________________________ Nanetta Batty, M.D.    LRI/MEDQ  D:  07/25/2010  T:  07/25/2010  Job:  [id number]  cc:   Lonzo Cloud. Kriste Basque, MD [place] Hilty, MD  Electronically Signed by Nada Boozer N.P. on 07/26/2010 11:37:58 AM Electronically Signed by Nanetta Batty M.D. on 08/07/2010 04:12:54 PM

## 2010-08-07 NOTE — Telephone Encounter (Signed)
Left message to call back; pt was given refill on 07-21-2010 #45 with 5 refills by TP; why is patient needing refill so soon and has she called her pharmacy for refill?[name]

## 2010-08-08 ENCOUNTER — Telehealth: Payer: Self-pay | Admitting: Pulmonary Disease

## 2010-08-08 NOTE — Telephone Encounter (Signed)
LMOMTCB

## 2010-08-08 NOTE — Telephone Encounter (Signed)
Pt returning call.[name] ° °

## 2010-08-09 NOTE — Telephone Encounter (Signed)
Spoke w/ pt and she states she has been itching on her chest and on her arm from where she had tape on it. Pt states she tried Benadryl otc but has not tried hydrocortisone cream. Pt states she will try that over the weekend and see if that helps. Nothing further was needed

## 2010-08-13 ENCOUNTER — Telehealth: Payer: Self-pay | Admitting: Pulmonary Disease

## 2010-08-13 NOTE — Telephone Encounter (Signed)
Also please let pt know that her appt w/ sn is on 09/18/10 at 2pm (i tried to call her to let her know it was NOT at 11:45 as I had previously told her, but couldn't get her on her home phone - also no anwering machine. Call her cell phone please- thanks. Tivis Ringer

## 2010-08-13 NOTE — Telephone Encounter (Signed)
Please advise pt of her appt in ju

## 2010-08-13 NOTE — Telephone Encounter (Signed)
lmomtcb x1 

## 2010-08-14 MED ORDER — FLUOCINONIDE 0.05 % EX CREA: TOPICAL_CREAM | Freq: Two times a day (BID) | CUTANEOUS | Status: DC

## 2010-08-14 NOTE — Telephone Encounter (Signed)
lmomtcb x 2  

## 2010-08-14 NOTE — Telephone Encounter (Signed)
Pt c/o rash and itching in areas where tape had been placed on her skin while in the hospital. She states cortisone cream helps for only a short period of time and the itching is worse at night. She is aware of upcomint appt with SN in June. Pls advise. Allergies  Allergen Reactions  . Clarithromycin     REACTION: unspecified  . Metformin     REACTION: pt states INTOL to Metformin "felt drained"

## 2010-08-14 NOTE — Telephone Encounter (Signed)
Per SN---ok for pt to have lidex e cream to use as directed. This has been sent to the pts pharmacy and lmomtcb for pt with number given.

## 2010-08-15 NOTE — Telephone Encounter (Signed)
Spoke w/ pt and she is aware rx was sent to pharamcy

## 2010-08-27 ENCOUNTER — Other Ambulatory Visit: Payer: Self-pay | Admitting: Pulmonary Disease

## 2010-08-27 ENCOUNTER — Other Ambulatory Visit (INDEPENDENT_AMBULATORY_CARE_PROVIDER_SITE_OTHER): Payer: Medicare HMO

## 2010-08-27 DIAGNOSIS — I8000 Phlebitis and thrombophlebitis of superficial vessels of unspecified lower extremity: Secondary | ICD-10-CM

## 2010-08-28 ENCOUNTER — Telehealth: Payer: Self-pay | Admitting: Pulmonary Disease

## 2010-08-28 NOTE — Telephone Encounter (Signed)
Notes Recorded by Michele Mcalpine, MD on 08/27/2010 at 7:18 PM On Coumadin 5mg  tabs taking 1 tab daily x7d... Protime sl too thin> rec to decr sl to ==> 1 tab x5d=TWFSS, & 1/2 tab x2d=MTh... Repeat protime in 3 weeks... [facility]   Called, spoke with pt.  She was informed of above results and recs per Sn.  She verbalized understanding of this and repeated this back to me.  She is aware to come back in 3 wks to have repeat protime.

## 2010-08-30 ENCOUNTER — Other Ambulatory Visit: Payer: Self-pay | Admitting: Pulmonary Disease

## 2010-09-03 HISTORY — PX: TRANSTHORACIC ECHOCARDIOGRAM: SHX275

## 2010-09-17 ENCOUNTER — Other Ambulatory Visit (INDEPENDENT_AMBULATORY_CARE_PROVIDER_SITE_OTHER): Payer: Medicare HMO

## 2010-09-17 DIAGNOSIS — I8000 Phlebitis and thrombophlebitis of superficial vessels of unspecified lower extremity: Secondary | ICD-10-CM

## 2010-09-17 LAB — PROTIME-INR: Prothrombin Time: 21 s — ABNORMAL HIGH (ref 10.2–12.4)

## 2010-09-18 ENCOUNTER — Ambulatory Visit: Payer: Medicare HMO | Admitting: Pulmonary Disease

## 2010-09-18 ENCOUNTER — Telehealth: Payer: Self-pay | Admitting: Pulmonary Disease

## 2010-09-18 NOTE — Telephone Encounter (Signed)
Please advise results, thanks 

## 2010-09-18 NOTE — Telephone Encounter (Signed)
Called and spoke with pt and she is aware of her lab results per SN---repeat protime in 1 month.  See result note message.

## 2010-09-18 NOTE — Telephone Encounter (Signed)
Cell number is [phone] if she isn't home.[place] Nickel

## 2010-10-14 ENCOUNTER — Other Ambulatory Visit (INDEPENDENT_AMBULATORY_CARE_PROVIDER_SITE_OTHER): Payer: Medicare HMO

## 2010-10-14 DIAGNOSIS — I8000 Phlebitis and thrombophlebitis of superficial vessels of unspecified lower extremity: Secondary | ICD-10-CM

## 2010-10-14 LAB — PROTIME-INR: INR: 1.7 ratio — ABNORMAL HIGH (ref 0.8–1.0)

## 2010-10-21 ENCOUNTER — Other Ambulatory Visit: Payer: Self-pay | Admitting: Pulmonary Disease

## 2010-11-19 ENCOUNTER — Ambulatory Visit (INDEPENDENT_AMBULATORY_CARE_PROVIDER_SITE_OTHER): Payer: Medicare HMO | Admitting: Pulmonary Disease

## 2010-11-19 ENCOUNTER — Encounter: Payer: Self-pay | Admitting: Pulmonary Disease

## 2010-11-19 ENCOUNTER — Other Ambulatory Visit (INDEPENDENT_AMBULATORY_CARE_PROVIDER_SITE_OTHER): Payer: Medicare HMO

## 2010-11-19 DIAGNOSIS — I471 Supraventricular tachycardia: Secondary | ICD-10-CM

## 2010-11-19 DIAGNOSIS — I872 Venous insufficiency (chronic) (peripheral): Secondary | ICD-10-CM

## 2010-11-19 DIAGNOSIS — I8000 Phlebitis and thrombophlebitis of superficial vessels of unspecified lower extremity: Secondary | ICD-10-CM

## 2010-11-19 DIAGNOSIS — I10 Essential (primary) hypertension: Secondary | ICD-10-CM

## 2010-11-19 DIAGNOSIS — I498 Other specified cardiac arrhythmias: Secondary | ICD-10-CM

## 2010-11-19 DIAGNOSIS — I4719 Other supraventricular tachycardia: Secondary | ICD-10-CM

## 2010-11-19 DIAGNOSIS — E7800 Pure hypercholesterolemia, unspecified: Secondary | ICD-10-CM

## 2010-11-19 DIAGNOSIS — M810 Age-related osteoporosis without current pathological fracture: Secondary | ICD-10-CM

## 2010-11-19 DIAGNOSIS — F411 Generalized anxiety disorder: Secondary | ICD-10-CM

## 2010-11-19 DIAGNOSIS — K589 Irritable bowel syndrome without diarrhea: Secondary | ICD-10-CM

## 2010-11-19 DIAGNOSIS — M1990 Unspecified osteoarthritis, unspecified site: Secondary | ICD-10-CM

## 2010-11-19 DIAGNOSIS — K449 Diaphragmatic hernia without obstruction or gangrene: Secondary | ICD-10-CM

## 2010-11-19 DIAGNOSIS — R7309 Other abnormal glucose: Secondary | ICD-10-CM

## 2010-11-19 MED ORDER — ALPRAZOLAM 0.5 MG PO TABS: 0.5000 mg | ORAL_TABLET | Freq: Three times a day (TID) | ORAL | Status: DC | PRN

## 2010-11-19 MED ORDER — HYDROCODONE-ACETAMINOPHEN 5-500 MG PO TABS: 1.0000 | ORAL_TABLET | Freq: Three times a day (TID) | ORAL | Status: DC | PRN
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
## 2010-11-19 NOTE — Patient Instructions (Signed)
Today we updated your med list in EPIC...    We decided to increase your COUMADIN dose slightly up to 1 tab for 6days & 1/2 tab on Monday only...    Be sure to come for repeat Protime blood test in 1 month...  Please return to our lab one morning soon for your follow up fasting blood work...    Then please call the [facility]REE in a few days for your results...    Dial [id number] & when prompted enter your patient number followed by the # symbol...    Your patient number is:  [id number]#  Call for any questions...  Let's try to increase your exercise...  Let's plan a follow up visit in 6 months, sooner if needed for problems.Marland KitchenMarland Kitchen

## 2010-11-21 ENCOUNTER — Ambulatory Visit: Payer: Medicare PPO | Admitting: Pulmonary Disease

## 2010-11-26 ENCOUNTER — Other Ambulatory Visit (INDEPENDENT_AMBULATORY_CARE_PROVIDER_SITE_OTHER): Payer: Medicare HMO

## 2010-11-26 ENCOUNTER — Other Ambulatory Visit: Payer: Self-pay | Admitting: Pulmonary Disease

## 2010-11-26 DIAGNOSIS — I10 Essential (primary) hypertension: Secondary | ICD-10-CM

## 2010-11-26 DIAGNOSIS — K589 Irritable bowel syndrome without diarrhea: Secondary | ICD-10-CM

## 2010-11-26 DIAGNOSIS — M810 Age-related osteoporosis without current pathological fracture: Secondary | ICD-10-CM

## 2010-11-26 DIAGNOSIS — E7800 Pure hypercholesterolemia, unspecified: Secondary | ICD-10-CM

## 2010-11-26 DIAGNOSIS — R7309 Other abnormal glucose: Secondary | ICD-10-CM

## 2010-11-26 DIAGNOSIS — K449 Diaphragmatic hernia without obstruction or gangrene: Secondary | ICD-10-CM

## 2010-11-26 LAB — CBC WITH DIFFERENTIAL/PLATELET
Basophils Absolute: 0 10*3/uL (ref 0.0–0.1)
Eosinophils Relative: 1.7 % (ref 0.0–5.0)
Monocytes Relative: 7.6 % (ref 3.0–12.0)
Neutrophils Relative %: 60.6 % (ref 43.0–77.0)
Platelets: 226 10*3/uL (ref 150.0–400.0)
WBC: 4.5 10*3/uL (ref 4.5–10.5)

## 2010-11-26 LAB — HEPATIC FUNCTION PANEL
ALT: 16 U/L (ref 0–35)
AST: 19 U/L (ref 0–37)
Albumin: 4.5 g/dL (ref 3.5–5.2)
Total Protein: 7.6 g/dL (ref 6.0–8.3)

## 2010-11-26 LAB — BASIC METABOLIC PANEL
BUN: 13 mg/dL (ref 6–23)
CO2: 30 mEq/L (ref 19–32)
Chloride: 104 mEq/L (ref 96–112)
Glucose, Bld: 128 mg/dL — ABNORMAL HIGH (ref 70–99)
Potassium: 4.3 mEq/L (ref 3.5–5.1)

## 2010-11-26 LAB — LIPID PANEL: HDL: 93.4 mg/dL (ref >39.00)

## 2010-11-26 LAB — HEMOGLOBIN A1C: Hgb A1c MFr Bld: 6.6 % — ABNORMAL HIGH (ref 4.6–6.5)

## 2010-11-27 LAB — VITAMIN D 25 HYDROXY (VIT D DEFICIENCY, FRACTURES): Vit D, 25-Hydroxy: 37 ng/mL (ref 30–89)

## 2010-11-29 ENCOUNTER — Other Ambulatory Visit: Payer: Self-pay | Admitting: Pulmonary Disease

## 2010-12-09 ENCOUNTER — Telehealth: Payer: Self-pay | Admitting: Pulmonary Disease

## 2010-12-09 NOTE — Telephone Encounter (Signed)
Spoke with pt. She states that she received her lab results via the PT system, and SN mentioned her lipid panel not looking as good as before. She states that he rec exercise and diet for this, or she could take her zocor every night instead of every other night as she has been. She wants to know which one SN prefers- med every night or exercise. She states will be happy to try either. Please advise, thanks!

## 2010-12-09 NOTE — Telephone Encounter (Signed)
LMTCB

## 2010-12-09 NOTE — Telephone Encounter (Signed)
It is okay, would just try diet and exercise first and keep on same meds.

## 2010-12-10 NOTE — Telephone Encounter (Signed)
Called and spoke with pt and per TP---we will just try diet and exercise for now and cont the same meds.  Pt voiced her understanding.

## 2010-12-17 ENCOUNTER — Other Ambulatory Visit (INDEPENDENT_AMBULATORY_CARE_PROVIDER_SITE_OTHER): Payer: Medicare HMO

## 2010-12-17 DIAGNOSIS — I8000 Phlebitis and thrombophlebitis of superficial vessels of unspecified lower extremity: Secondary | ICD-10-CM

## 2010-12-17 LAB — PROTIME-INR: Prothrombin Time: 19.4 s — ABNORMAL HIGH (ref 10.2–12.4)

## 2010-12-18 ENCOUNTER — Other Ambulatory Visit: Payer: Self-pay | Admitting: Pulmonary Disease

## 2011-01-14 ENCOUNTER — Ambulatory Visit: Payer: Medicare HMO

## 2011-01-14 DIAGNOSIS — I8000 Phlebitis and thrombophlebitis of superficial vessels of unspecified lower extremity: Secondary | ICD-10-CM

## 2011-01-21 ENCOUNTER — Ambulatory Visit: Payer: Medicare HMO

## 2011-01-21 ENCOUNTER — Ambulatory Visit (INDEPENDENT_AMBULATORY_CARE_PROVIDER_SITE_OTHER): Payer: Medicare HMO

## 2011-01-21 DIAGNOSIS — Z23 Encounter for immunization: Secondary | ICD-10-CM

## 2011-01-27 ENCOUNTER — Other Ambulatory Visit: Payer: Self-pay | Admitting: Pulmonary Disease

## 2011-01-27 DIAGNOSIS — Z1231 Encounter for screening mammogram for malignant neoplasm of breast: Secondary | ICD-10-CM

## 2011-02-10 ENCOUNTER — Other Ambulatory Visit: Payer: Self-pay | Admitting: Pulmonary Disease

## 2011-02-13 ENCOUNTER — Other Ambulatory Visit (INDEPENDENT_AMBULATORY_CARE_PROVIDER_SITE_OTHER): Payer: Medicare HMO

## 2011-02-13 DIAGNOSIS — I8000 Phlebitis and thrombophlebitis of superficial vessels of unspecified lower extremity: Secondary | ICD-10-CM

## 2011-02-13 NOTE — Telephone Encounter (Signed)
Please advise if okay to refill alprazolam and bentyl thanks

## 2011-02-14 ENCOUNTER — Ambulatory Visit (HOSPITAL_COMMUNITY)
Admission: RE | Admit: 2011-02-14 | Discharge: 2011-02-14 | Disposition: A | Discharge status: Home / Self Care | Payer: Medicare HMO | Source: Ambulatory Visit | Attending: Pulmonary Disease | Admitting: Pulmonary Disease

## 2011-02-14 DIAGNOSIS — Z1231 Encounter for screening mammogram for malignant neoplasm of breast: Secondary | ICD-10-CM | POA: Insufficient documentation

## 2011-02-25 ENCOUNTER — Other Ambulatory Visit: Payer: Self-pay | Admitting: Pulmonary Disease

## 2011-03-18 ENCOUNTER — Other Ambulatory Visit (INDEPENDENT_AMBULATORY_CARE_PROVIDER_SITE_OTHER): Payer: Medicare HMO

## 2011-03-18 DIAGNOSIS — I8000 Phlebitis and thrombophlebitis of superficial vessels of unspecified lower extremity: Secondary | ICD-10-CM

## 2011-03-18 LAB — PROTIME-INR
INR: 1.9 ratio — ABNORMAL HIGH (ref 0.8–1.0)
Prothrombin Time: 20.7 s — ABNORMAL HIGH (ref 10.2–12.4)

## 2011-03-19 ENCOUNTER — Telehealth: Payer: Self-pay | Admitting: Pulmonary Disease

## 2011-03-19 NOTE — Telephone Encounter (Signed)
Per pt's chart:  Result Notes     Notes Recorded by Michele Mcalpine, MD on 03/18/2011 at 6:22 PM On Coumadin 5mg - taking 1 tab daily... Protime looks good> continue same dose & repeat PT in 44mo...   Called, LMOM with results per pt's request.  Encouraged to call with any questions / concerns.  Will sign off.

## 2011-03-22 ENCOUNTER — Other Ambulatory Visit: Payer: Self-pay | Admitting: Adult Health

## 2011-03-24 ENCOUNTER — Ambulatory Visit (INDEPENDENT_AMBULATORY_CARE_PROVIDER_SITE_OTHER): Payer: Medicare HMO

## 2011-03-24 DIAGNOSIS — S93409A Sprain of unspecified ligament of unspecified ankle, initial encounter: Secondary | ICD-10-CM

## 2011-03-24 DIAGNOSIS — S0003XA Contusion of scalp, initial encounter: Secondary | ICD-10-CM

## 2011-03-24 DIAGNOSIS — IMO0002 Reserved for concepts with insufficient information to code with codable children: Secondary | ICD-10-CM

## 2011-04-03 ENCOUNTER — Telehealth: Payer: Self-pay | Admitting: Pulmonary Disease

## 2011-04-03 NOTE — Telephone Encounter (Signed)
Pt called back and she stated that she hurt her ankle about 3 weeks ago---seen at [facility] and taken xrays and they did give her ankle brace---she stated that her leg was swollen last night---using ice and heat to her leg today---she is wanting to know anything else she can try.  No ortho doctor that she has seen in the past.  Please advise. thanks

## 2011-04-03 NOTE — Telephone Encounter (Signed)
lmomtcb for pt 

## 2011-04-03 NOTE — Telephone Encounter (Signed)
Pt returned call

## 2011-04-07 NOTE — Telephone Encounter (Signed)
Called and spoke with pt and she stated that her ankle is doing better.  She has been using ice, and elevating her ankle as needed.  She is aware to cont to do this until the ankle is back to 100%.  Pt voiced her understanding of this.pt  Will; call back for any concerns or questions

## 2011-04-09 ENCOUNTER — Other Ambulatory Visit: Payer: Self-pay | Admitting: Pulmonary Disease

## 2011-04-16 ENCOUNTER — Other Ambulatory Visit (INDEPENDENT_AMBULATORY_CARE_PROVIDER_SITE_OTHER): Payer: Medicare HMO

## 2011-04-16 DIAGNOSIS — I8000 Phlebitis and thrombophlebitis of superficial vessels of unspecified lower extremity: Secondary | ICD-10-CM

## 2011-04-16 LAB — PROTIME-INR
INR: 1.9 ratio — ABNORMAL HIGH (ref 0.8–1.0)
Prothrombin Time: 21 s — ABNORMAL HIGH (ref 10.2–12.4)

## 2011-04-17 ENCOUNTER — Other Ambulatory Visit: Payer: Self-pay | Admitting: Pulmonary Disease

## 2011-05-15 ENCOUNTER — Other Ambulatory Visit: Payer: Self-pay | Admitting: Pulmonary Disease

## 2011-05-21 ENCOUNTER — Encounter: Payer: Self-pay | Admitting: Pulmonary Disease

## 2011-05-21 ENCOUNTER — Ambulatory Visit (INDEPENDENT_AMBULATORY_CARE_PROVIDER_SITE_OTHER): Payer: Medicare HMO | Admitting: Pulmonary Disease

## 2011-05-21 VITALS — BP 114/72 | HR 56 | Temp 96.9°F | Ht 67.0 in | Wt 181.0 lb

## 2011-05-21 DIAGNOSIS — I872 Venous insufficiency (chronic) (peripheral): Secondary | ICD-10-CM

## 2011-05-21 DIAGNOSIS — K589 Irritable bowel syndrome without diarrhea: Secondary | ICD-10-CM

## 2011-05-21 DIAGNOSIS — R7309 Other abnormal glucose: Secondary | ICD-10-CM

## 2011-05-21 DIAGNOSIS — I2510 Atherosclerotic heart disease of native coronary artery without angina pectoris: Secondary | ICD-10-CM

## 2011-05-21 DIAGNOSIS — F411 Generalized anxiety disorder: Secondary | ICD-10-CM

## 2011-05-21 DIAGNOSIS — E7800 Pure hypercholesterolemia, unspecified: Secondary | ICD-10-CM

## 2011-05-21 DIAGNOSIS — K449 Diaphragmatic hernia without obstruction or gangrene: Secondary | ICD-10-CM

## 2011-05-21 DIAGNOSIS — I10 Essential (primary) hypertension: Secondary | ICD-10-CM

## 2011-05-21 DIAGNOSIS — I471 Supraventricular tachycardia: Secondary | ICD-10-CM

## 2011-05-21 DIAGNOSIS — M810 Age-related osteoporosis without current pathological fracture: Secondary | ICD-10-CM

## 2011-05-21 DIAGNOSIS — M1990 Unspecified osteoarthritis, unspecified site: Secondary | ICD-10-CM

## 2011-05-21 DIAGNOSIS — I498 Other specified cardiac arrhythmias: Secondary | ICD-10-CM

## 2011-05-21 NOTE — Patient Instructions (Signed)
Today we updated your med list in our EPIC system...    Continue your current medications the same...  Please return to our lab one morning soon for your FASTING blood work...    We will call you w/ the results, and your Protime results...  Stay as active as possible, and be careful!!!  Call for any questions...  Let's plan another follow up visit in 6 months, sooner if needed for problems.Marland KitchenMarland Kitchen
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
## 2011-05-27 ENCOUNTER — Other Ambulatory Visit (INDEPENDENT_AMBULATORY_CARE_PROVIDER_SITE_OTHER): Payer: Medicare HMO

## 2011-05-27 DIAGNOSIS — E7800 Pure hypercholesterolemia, unspecified: Secondary | ICD-10-CM

## 2011-05-27 DIAGNOSIS — I10 Essential (primary) hypertension: Secondary | ICD-10-CM

## 2011-05-27 DIAGNOSIS — I8000 Phlebitis and thrombophlebitis of superficial vessels of unspecified lower extremity: Secondary | ICD-10-CM

## 2011-05-27 DIAGNOSIS — M810 Age-related osteoporosis without current pathological fracture: Secondary | ICD-10-CM

## 2011-05-27 DIAGNOSIS — F411 Generalized anxiety disorder: Secondary | ICD-10-CM

## 2011-05-27 DIAGNOSIS — K449 Diaphragmatic hernia without obstruction or gangrene: Secondary | ICD-10-CM

## 2011-05-27 DIAGNOSIS — R7309 Other abnormal glucose: Secondary | ICD-10-CM

## 2011-05-27 LAB — BASIC METABOLIC PANEL
CO2: 29 mEq/L (ref 19–32)
Chloride: 104 mEq/L (ref 96–112)
Sodium: 139 mEq/L (ref 135–145)

## 2011-05-27 LAB — CBC WITH DIFFERENTIAL/PLATELET
Hemoglobin: 12.9 g/dL (ref 12.0–15.0)
MCHC: 33.2 g/dL (ref 30.0–36.0)
MCV: 90 fl (ref 78.0–100.0)
RDW: 14.1 % (ref 11.5–14.6)

## 2011-05-27 LAB — TSH: TSH: 1.34 u[IU]/mL (ref 0.35–5.50)

## 2011-05-27 LAB — HEPATIC FUNCTION PANEL
ALT: 16 U/L (ref 0–35)
Alkaline Phosphatase: 50 U/L (ref 39–117)
Bilirubin, Direct: 0.1 mg/dL (ref 0.0–0.3)
Total Bilirubin: 0.8 mg/dL (ref 0.3–1.2)
Total Protein: 7.1 g/dL (ref 6.0–8.3)

## 2011-05-27 LAB — PROTIME-INR: Prothrombin Time: 24.5 s — ABNORMAL HIGH (ref 10.2–12.4)

## 2011-05-27 LAB — LIPID PANEL
LDL Cholesterol: 92 mg/dL (ref 0–99)
Total CHOL/HDL Ratio: 2

## 2011-05-28 LAB — VITAMIN D 25 HYDROXY (VIT D DEFICIENCY, FRACTURES): Vit D, 25-Hydroxy: 37 ng/mL (ref 30–89)

## 2011-06-03 ENCOUNTER — Telehealth: Payer: Self-pay | Admitting: Pulmonary Disease

## 2011-06-03 MED ORDER — HYDROXYZINE HCL 25 MG PO TABS: 25.0000 mg | ORAL_TABLET | Freq: Four times a day (QID) | ORAL | Status: AC | PRN

## 2011-06-03 NOTE — Telephone Encounter (Signed)
Per SN: Atarax 25 mg #30 1 po q6hrs prn. I spoke with pt and advised her of SN recs and she voiced her understanding and had no questions. I have sent rx in

## 2011-06-03 NOTE — Telephone Encounter (Signed)
Pt stated she is going to be stepping out of the house for a while & asked to be reached on her cell phone, [phone].  [name]

## 2011-06-03 NOTE — Telephone Encounter (Signed)
Called and spoke with pt. She states started taking mucinex on 2/15 and next morning woke up itching all over and had "tiny red rash bumps all over". She called pharmacist and they advised stop mucinex and start benadryl. She is still taking the benadryl, and this has helped some, but still has "itching under skin". Would like recs from SN. Please advise, thanks! Allergies  Allergen Reactions  . Clarithromycin     REACTION: unspecified  . Metformin     REACTION: pt states INTOL to Metformin "felt drained"  . Mucinex     itching

## 2011-06-25 ENCOUNTER — Other Ambulatory Visit (INDEPENDENT_AMBULATORY_CARE_PROVIDER_SITE_OTHER): Payer: Medicare HMO

## 2011-06-25 DIAGNOSIS — I471 Supraventricular tachycardia: Secondary | ICD-10-CM

## 2011-06-25 DIAGNOSIS — I498 Other specified cardiac arrhythmias: Secondary | ICD-10-CM

## 2011-06-25 LAB — PROTIME-INR: INR: 1.7 ratio — ABNORMAL HIGH (ref 0.8–1.0)

## 2011-07-29 ENCOUNTER — Other Ambulatory Visit (INDEPENDENT_AMBULATORY_CARE_PROVIDER_SITE_OTHER): Payer: Medicare HMO

## 2011-07-29 DIAGNOSIS — I8000 Phlebitis and thrombophlebitis of superficial vessels of unspecified lower extremity: Secondary | ICD-10-CM

## 2011-07-29 LAB — PROTIME-INR: Prothrombin Time: 17.8 s — ABNORMAL HIGH (ref 10.2–12.4)

## 2011-08-28 ENCOUNTER — Other Ambulatory Visit (INDEPENDENT_AMBULATORY_CARE_PROVIDER_SITE_OTHER): Payer: Medicare HMO

## 2011-08-28 DIAGNOSIS — I498 Other specified cardiac arrhythmias: Secondary | ICD-10-CM

## 2011-08-28 DIAGNOSIS — I471 Supraventricular tachycardia: Secondary | ICD-10-CM

## 2011-08-28 LAB — PROTIME-INR
INR: 1.3 ratio — ABNORMAL HIGH (ref 0.8–1.0)
Prothrombin Time: 14.6 s — ABNORMAL HIGH (ref 10.2–12.4)

## 2011-09-01 ENCOUNTER — Telehealth: Payer: Self-pay | Admitting: Pulmonary Disease

## 2011-09-01 NOTE — Telephone Encounter (Signed)
i have called and spoke with pt about her protime results and pt is aware to change her dose to 1/2 tab on Monday and 1 tab x 6 days.  Pt voiced her understanding of this change.

## 2011-09-04 ENCOUNTER — Other Ambulatory Visit: Payer: Self-pay | Admitting: Pulmonary Disease

## 2011-09-13 ENCOUNTER — Other Ambulatory Visit: Payer: Self-pay | Admitting: Pulmonary Disease

## 2011-10-02 ENCOUNTER — Other Ambulatory Visit (INDEPENDENT_AMBULATORY_CARE_PROVIDER_SITE_OTHER): Payer: Medicare HMO

## 2011-10-02 DIAGNOSIS — I498 Other specified cardiac arrhythmias: Secondary | ICD-10-CM

## 2011-10-02 DIAGNOSIS — I471 Supraventricular tachycardia: Secondary | ICD-10-CM

## 2011-10-02 LAB — PROTIME-INR: Prothrombin Time: 18.8 s — ABNORMAL HIGH (ref 10.2–12.4)

## 2011-10-14 ENCOUNTER — Other Ambulatory Visit: Payer: Self-pay | Admitting: Pulmonary Disease

## 2011-10-30 ENCOUNTER — Other Ambulatory Visit (INDEPENDENT_AMBULATORY_CARE_PROVIDER_SITE_OTHER): Payer: Medicare HMO

## 2011-10-30 DIAGNOSIS — I498 Other specified cardiac arrhythmias: Secondary | ICD-10-CM

## 2011-10-30 DIAGNOSIS — I471 Supraventricular tachycardia: Secondary | ICD-10-CM

## 2011-10-30 LAB — PROTIME-INR: INR: 1.7 ratio — ABNORMAL HIGH (ref 0.8–1.0)

## 2011-10-31 ENCOUNTER — Telehealth: Payer: Self-pay | Admitting: Pulmonary Disease

## 2011-10-31 NOTE — Telephone Encounter (Signed)
Called and spoke with pt and she is aware of lab results per SN.  Nothing further needed.

## 2011-11-07 ENCOUNTER — Other Ambulatory Visit: Payer: Self-pay | Admitting: Pulmonary Disease

## 2011-11-13 ENCOUNTER — Other Ambulatory Visit: Payer: Self-pay | Admitting: Pulmonary Disease

## 2011-11-20 ENCOUNTER — Ambulatory Visit (INDEPENDENT_AMBULATORY_CARE_PROVIDER_SITE_OTHER): Payer: Medicare HMO | Admitting: Pulmonary Disease

## 2011-11-20 ENCOUNTER — Encounter: Payer: Self-pay | Admitting: Pulmonary Disease

## 2011-11-20 VITALS — BP 126/78 | HR 66 | Temp 97.7°F | Ht 67.0 in | Wt 182.6 lb

## 2011-11-20 DIAGNOSIS — I872 Venous insufficiency (chronic) (peripheral): Secondary | ICD-10-CM

## 2011-11-20 DIAGNOSIS — I2510 Atherosclerotic heart disease of native coronary artery without angina pectoris: Secondary | ICD-10-CM

## 2011-11-20 DIAGNOSIS — I059 Rheumatic mitral valve disease, unspecified: Secondary | ICD-10-CM

## 2011-11-20 DIAGNOSIS — M810 Age-related osteoporosis without current pathological fracture: Secondary | ICD-10-CM

## 2011-11-20 DIAGNOSIS — K449 Diaphragmatic hernia without obstruction or gangrene: Secondary | ICD-10-CM

## 2011-11-20 DIAGNOSIS — I10 Essential (primary) hypertension: Secondary | ICD-10-CM

## 2011-11-20 DIAGNOSIS — M1990 Unspecified osteoarthritis, unspecified site: Secondary | ICD-10-CM

## 2011-11-20 DIAGNOSIS — R7309 Other abnormal glucose: Secondary | ICD-10-CM

## 2011-11-20 DIAGNOSIS — K589 Irritable bowel syndrome without diarrhea: Secondary | ICD-10-CM

## 2011-11-20 DIAGNOSIS — E7800 Pure hypercholesterolemia, unspecified: Secondary | ICD-10-CM

## 2011-11-20 DIAGNOSIS — F411 Generalized anxiety disorder: Secondary | ICD-10-CM

## 2011-11-20 MED ORDER — SIMVASTATIN 20 MG PO TABS: 20.0000 mg | ORAL_TABLET | Freq: Every day | ORAL | Status: DC
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
## 2011-11-20 NOTE — Patient Instructions (Addendum)
Today we updated your med list in our EPIC system...    Continue your current medications the same...    We refilled the meds you requested...  Keep up the good work w/ diet & exercise...  Call for any problems...  Let's plan a follow up visit in 6 months w/ FASTING blood work at that time.Marland KitchenMarland Kitchen

## 2011-11-23 ENCOUNTER — Encounter: Payer: Self-pay | Admitting: Pulmonary Disease

## 2011-12-04 ENCOUNTER — Other Ambulatory Visit (INDEPENDENT_AMBULATORY_CARE_PROVIDER_SITE_OTHER): Payer: Medicare HMO

## 2011-12-04 DIAGNOSIS — I498 Other specified cardiac arrhythmias: Secondary | ICD-10-CM

## 2011-12-04 DIAGNOSIS — I471 Supraventricular tachycardia: Secondary | ICD-10-CM

## 2011-12-09 ENCOUNTER — Telehealth: Payer: Self-pay | Admitting: Pulmonary Disease

## 2011-12-09 NOTE — Telephone Encounter (Signed)
lmomtcb x1 

## 2011-12-09 NOTE — Telephone Encounter (Signed)
Per SN----keep her feet elevated.  SN has not seen anything from Dr. Dione Booze as of today but we will keep a lookout for it to come through.   thanks

## 2011-12-09 NOTE — Telephone Encounter (Signed)
Spoke with pt and notified of recs per SN. She verbalized understanding and states nothing further needed.  

## 2011-12-09 NOTE — Telephone Encounter (Signed)
I spoke with pt and she c/o left foot has been swollen x Sunday night. She stated last night she soaked her foot in warm water and now just have swelling in her toes. I advised pt to keep her foot elevated, no salt in her diet. She voiced her understanding. Also pt is wanting to know if SN received a note from her eye doctor Dr. Dione Booze (across the street per pt) about her visit with him last week. Please advise thannks

## 2012-01-05 ENCOUNTER — Other Ambulatory Visit (INDEPENDENT_AMBULATORY_CARE_PROVIDER_SITE_OTHER): Payer: Medicare HMO

## 2012-01-05 DIAGNOSIS — I498 Other specified cardiac arrhythmias: Secondary | ICD-10-CM

## 2012-01-05 DIAGNOSIS — I471 Supraventricular tachycardia: Secondary | ICD-10-CM

## 2012-01-05 LAB — PROTIME-INR
INR: 2.3 ratio — ABNORMAL HIGH (ref 0.8–1.0)
Prothrombin Time: 25 s — ABNORMAL HIGH (ref 10.2–12.4)

## 2012-01-06 ENCOUNTER — Telehealth: Payer: Self-pay | Admitting: Pulmonary Disease

## 2012-01-06 NOTE — Telephone Encounter (Signed)
Spoke with patient in regards to labs and SN recs. Pt to return in 3 weeks for ProTime labs and also aware to call the very next for results.   Pt concerned about eye redness...states that she has been having red "spots"// "bleeding spots?" x 3 weeks in both eyes off and on. Pt states that she went to Eye Dr and he stated that eye exam was good but that she needed to tell SN in case this was possibly something that her Coumadin could be causing.  SN please advise. Thanks.

## 2012-01-06 NOTE — Telephone Encounter (Signed)
Per SN: get natural tear drops OTC. We will continue to carefully monitor her protime/coumadin.

## 2012-01-06 NOTE — Telephone Encounter (Signed)
I spoke with pt and is aware of SN recs. She voiced her understanding and needed nothing further 

## 2012-01-25 ENCOUNTER — Other Ambulatory Visit: Payer: Self-pay | Admitting: Pulmonary Disease

## 2012-01-27 ENCOUNTER — Telehealth: Payer: Self-pay | Admitting: Pulmonary Disease

## 2012-01-27 ENCOUNTER — Other Ambulatory Visit (INDEPENDENT_AMBULATORY_CARE_PROVIDER_SITE_OTHER): Payer: Medicare HMO

## 2012-01-27 DIAGNOSIS — I498 Other specified cardiac arrhythmias: Secondary | ICD-10-CM

## 2012-01-27 DIAGNOSIS — I471 Supraventricular tachycardia: Secondary | ICD-10-CM

## 2012-01-27 LAB — PROTIME-INR
INR: 1.9 ratio — ABNORMAL HIGH (ref 0.8–1.0)
Prothrombin Time: 20 s — ABNORMAL HIGH (ref 10.2–12.4)

## 2012-01-27 NOTE — Telephone Encounter (Signed)
Please advise on PT/INR results thanks

## 2012-01-28 NOTE — Telephone Encounter (Signed)
Per SN----protime looks good on the coumadin 5 mg   1 tab x 5 days and 1/2 tab on Monday and Thursday.  Repeat in 1 month.  Called and spoke with pt and she is aware of these results and will return in 1 month for repeat protime.  Nothing further is needed.

## 2012-02-02 ENCOUNTER — Other Ambulatory Visit: Payer: Self-pay | Admitting: Pulmonary Disease

## 2012-02-03 ENCOUNTER — Telehealth: Payer: Self-pay | Admitting: Pulmonary Disease

## 2012-02-03 NOTE — Telephone Encounter (Signed)
Per SN---she see's Dr. Hilty-------is she is having breakthrough symptoms at night SN suggests increasing the metoprolol 25 mg to 1 every morning and 2 in the evening. And will need an appt with Dr. Rennis Golden at [facility].  thanks

## 2012-02-03 NOTE — Telephone Encounter (Signed)
Spoke with pt. She is c/o "fluttering in chest" for the past 2-3 days, wakes her up "usually at 3 am". She states that although this is not painful, and more of a discomfort, she takes vicodin and the feeling goes away. She called cards today (Dr. Anthonette Legato) and they told her this did not sound cardiac related, and she should call her PCP. She states that she still wanted appt with cards and they would not make her one b/c Dr Anthonette Legato is not there until next wk. Please advise, thanks!

## 2012-02-03 NOTE — Addendum Note (Signed)
Addended by: Michele Mcalpine on: 02/03/2012 07:45 PM   Modules accepted: Level of Service

## 2012-02-03 NOTE — Telephone Encounter (Signed)
I spoke with pt and made her aware of SN recs. She voiced her understanding and will call [facility] for an appt. Nothing further was needed

## 2012-02-24 ENCOUNTER — Other Ambulatory Visit (INDEPENDENT_AMBULATORY_CARE_PROVIDER_SITE_OTHER): Payer: Medicare HMO

## 2012-02-24 ENCOUNTER — Ambulatory Visit (INDEPENDENT_AMBULATORY_CARE_PROVIDER_SITE_OTHER): Payer: Medicare HMO

## 2012-02-24 DIAGNOSIS — I498 Other specified cardiac arrhythmias: Secondary | ICD-10-CM

## 2012-02-24 DIAGNOSIS — Z23 Encounter for immunization: Secondary | ICD-10-CM

## 2012-02-24 DIAGNOSIS — I471 Supraventricular tachycardia: Secondary | ICD-10-CM

## 2012-02-24 LAB — PROTIME-INR
INR: 1.9 ratio — ABNORMAL HIGH (ref 0.8–1.0)
Prothrombin Time: 20.3 s — ABNORMAL HIGH (ref 10.2–12.4)

## 2012-02-26 ENCOUNTER — Telehealth: Payer: Self-pay | Admitting: Pulmonary Disease

## 2012-02-26 NOTE — Telephone Encounter (Signed)
Notes Recorded by Michele Mcalpine, MD on 02/25/2012 at 7:41 AM Please notify patient>  On Coumadin 5mg  tabs> taking 1x5d=TWFSS & 1/2 x2d=MTh... Protime looks good, continue same dose, recheck protime 1 month... ------  I spoke with patient about results and she verbalized understanding and had no questions

## 2012-03-17 ENCOUNTER — Other Ambulatory Visit: Payer: Self-pay | Admitting: Pulmonary Disease

## 2012-03-17 DIAGNOSIS — Z1231 Encounter for screening mammogram for malignant neoplasm of breast: Secondary | ICD-10-CM

## 2012-03-22 ENCOUNTER — Other Ambulatory Visit (INDEPENDENT_AMBULATORY_CARE_PROVIDER_SITE_OTHER): Payer: Medicare HMO

## 2012-03-22 DIAGNOSIS — I498 Other specified cardiac arrhythmias: Secondary | ICD-10-CM

## 2012-03-22 DIAGNOSIS — I471 Supraventricular tachycardia: Secondary | ICD-10-CM

## 2012-04-01 ENCOUNTER — Other Ambulatory Visit: Payer: Self-pay | Admitting: Pulmonary Disease

## 2012-04-19 ENCOUNTER — Ambulatory Visit (HOSPITAL_COMMUNITY): Payer: Medicare HMO

## 2012-04-26 ENCOUNTER — Ambulatory Visit (HOSPITAL_COMMUNITY)
Admission: RE | Admit: 2012-04-26 | Discharge: 2012-04-26 | Disposition: A | Discharge status: Home / Self Care | Payer: Medicare HMO | Source: Ambulatory Visit | Attending: Pulmonary Disease | Admitting: Pulmonary Disease

## 2012-04-26 DIAGNOSIS — Z1231 Encounter for screening mammogram for malignant neoplasm of breast: Secondary | ICD-10-CM | POA: Insufficient documentation

## 2012-04-29 ENCOUNTER — Other Ambulatory Visit (INDEPENDENT_AMBULATORY_CARE_PROVIDER_SITE_OTHER): Payer: Medicare HMO

## 2012-04-29 DIAGNOSIS — I471 Supraventricular tachycardia: Secondary | ICD-10-CM

## 2012-04-29 DIAGNOSIS — I498 Other specified cardiac arrhythmias: Secondary | ICD-10-CM

## 2012-04-29 LAB — PROTIME-INR: Prothrombin Time: 14.6 s — ABNORMAL HIGH (ref 10.2–12.4)

## 2012-05-07 ENCOUNTER — Telehealth: Payer: Self-pay | Admitting: Pulmonary Disease

## 2012-05-07 NOTE — Telephone Encounter (Signed)
Per SN---no openings,  Use hot soaks, use the tramadol tid with tylenol, use the vicodin prn for the pain.  Pt voiced her understanding and she will cont these recs over the weekend  And she is to call back next week if this is not better and we may need to set her up with ortho.  Nothing further is needed.

## 2012-05-07 NOTE — Telephone Encounter (Signed)
Pt has called back again. Wants to know if she is going to be seen today. [name]

## 2012-05-07 NOTE — Telephone Encounter (Signed)
I spoke with pt and she her left ankle is sore x Thursday. She stated she did not hit it on anything, no swollen, not red, no warm. She has been keeping warm towel around it. She was limp walking slightly. Today is is better but still slightly sore. She is not sure what it could be. No available openings. Please advise SN thanks Last OV 11/18/11 Pending OV 05/27/12 Allergies  Allergen Reactions  . Clarithromycin     REACTION: unspecified  . Guaifenesin Er     itching  . Metformin     REACTION: pt states INTOL to Metformin "felt drained"

## 2012-05-26 ENCOUNTER — Telehealth: Payer: Self-pay | Admitting: Pulmonary Disease

## 2012-05-26 DIAGNOSIS — R7309 Other abnormal glucose: Secondary | ICD-10-CM

## 2012-05-26 DIAGNOSIS — F411 Generalized anxiety disorder: Secondary | ICD-10-CM

## 2012-05-26 DIAGNOSIS — E559 Vitamin D deficiency, unspecified: Secondary | ICD-10-CM

## 2012-05-26 DIAGNOSIS — I10 Essential (primary) hypertension: Secondary | ICD-10-CM

## 2012-05-26 DIAGNOSIS — E7800 Pure hypercholesterolemia, unspecified: Secondary | ICD-10-CM

## 2012-05-26 NOTE — Telephone Encounter (Signed)
Pls advise on labs for pt.

## 2012-05-27 ENCOUNTER — Ambulatory Visit: Payer: Medicare HMO | Admitting: Pulmonary Disease

## 2012-05-31 ENCOUNTER — Other Ambulatory Visit: Payer: Self-pay | Admitting: Pulmonary Disease

## 2012-05-31 NOTE — Telephone Encounter (Signed)
Called and spoke with pt and she is aware of labs in the computer for her.  Pt is aware and nothing further.

## 2012-06-08 ENCOUNTER — Other Ambulatory Visit (INDEPENDENT_AMBULATORY_CARE_PROVIDER_SITE_OTHER): Payer: Medicare HMO

## 2012-06-08 ENCOUNTER — Other Ambulatory Visit: Payer: Self-pay | Admitting: Pulmonary Disease

## 2012-06-08 DIAGNOSIS — I8000 Phlebitis and thrombophlebitis of superficial vessels of unspecified lower extremity: Secondary | ICD-10-CM

## 2012-06-24 ENCOUNTER — Other Ambulatory Visit: Payer: Self-pay | Admitting: Pulmonary Disease

## 2012-06-28 ENCOUNTER — Other Ambulatory Visit (INDEPENDENT_AMBULATORY_CARE_PROVIDER_SITE_OTHER): Payer: Medicare HMO

## 2012-06-28 DIAGNOSIS — I8000 Phlebitis and thrombophlebitis of superficial vessels of unspecified lower extremity: Secondary | ICD-10-CM

## 2012-06-30 ENCOUNTER — Telehealth: Payer: Self-pay | Admitting: Pulmonary Disease

## 2012-06-30 NOTE — Telephone Encounter (Signed)
Called and spoke with pt and she is aware of lab results per SN.  Pt voiced her understanding and she will keep her appt with SN next week and will return to the lab next month for repeat protime.  Nothing further is needed.

## 2012-07-02 ENCOUNTER — Other Ambulatory Visit (INDEPENDENT_AMBULATORY_CARE_PROVIDER_SITE_OTHER): Payer: Medicare HMO

## 2012-07-02 DIAGNOSIS — R7309 Other abnormal glucose: Secondary | ICD-10-CM

## 2012-07-02 DIAGNOSIS — F411 Generalized anxiety disorder: Secondary | ICD-10-CM

## 2012-07-02 DIAGNOSIS — I10 Essential (primary) hypertension: Secondary | ICD-10-CM

## 2012-07-02 DIAGNOSIS — E559 Vitamin D deficiency, unspecified: Secondary | ICD-10-CM

## 2012-07-02 DIAGNOSIS — E7800 Pure hypercholesterolemia, unspecified: Secondary | ICD-10-CM

## 2012-07-02 LAB — BASIC METABOLIC PANEL
BUN: 14 mg/dL (ref 6–23)
CO2: 30 mEq/L (ref 19–32)
Chloride: 102 mEq/L (ref 96–112)
Potassium: 4 mEq/L (ref 3.5–5.1)

## 2012-07-02 LAB — LIPID PANEL
LDL Cholesterol: 84 mg/dL (ref 0–99)
Total CHOL/HDL Ratio: 2

## 2012-07-02 LAB — HEPATIC FUNCTION PANEL
ALT: 18 U/L (ref 0–35)
AST: 24 U/L (ref 0–37)
Total Bilirubin: 0.9 mg/dL (ref 0.3–1.2)

## 2012-07-02 LAB — CBC WITH DIFFERENTIAL/PLATELET
Eosinophils Absolute: 0.2 10*3/uL (ref 0.0–0.7)
Eosinophils Relative: 4.8 % (ref 0.0–5.0)
MCHC: 33.6 g/dL (ref 30.0–36.0)
MCV: 87.3 fl (ref 78.0–100.0)
Monocytes Absolute: 0.4 10*3/uL (ref 0.1–1.0)
Neutrophils Relative %: 55.6 % (ref 43.0–77.0)
Platelets: 243 10*3/uL (ref 150.0–400.0)
WBC: 4.8 10*3/uL (ref 4.5–10.5)

## 2012-07-02 LAB — TSH: TSH: 1.74 u[IU]/mL (ref 0.35–5.50)

## 2012-07-02 LAB — HEMOGLOBIN A1C: Hgb A1c MFr Bld: 6.7 % — ABNORMAL HIGH (ref 4.6–6.5)

## 2012-07-03 LAB — VITAMIN D 25 HYDROXY (VIT D DEFICIENCY, FRACTURES): Vit D, 25-Hydroxy: 42 ng/mL (ref 30–89)

## 2012-07-05 ENCOUNTER — Ambulatory Visit (INDEPENDENT_AMBULATORY_CARE_PROVIDER_SITE_OTHER): Payer: Medicare HMO | Admitting: Pulmonary Disease

## 2012-07-05 ENCOUNTER — Encounter: Payer: Self-pay | Admitting: Pulmonary Disease

## 2012-07-05 VITALS — BP 128/68 | HR 66 | Temp 98.0°F | Ht 67.0 in | Wt 181.6 lb

## 2012-07-05 DIAGNOSIS — R7309 Other abnormal glucose: Secondary | ICD-10-CM

## 2012-07-05 DIAGNOSIS — M810 Age-related osteoporosis without current pathological fracture: Secondary | ICD-10-CM

## 2012-07-05 DIAGNOSIS — F411 Generalized anxiety disorder: Secondary | ICD-10-CM

## 2012-07-05 DIAGNOSIS — M1990 Unspecified osteoarthritis, unspecified site: Secondary | ICD-10-CM

## 2012-07-05 DIAGNOSIS — E7800 Pure hypercholesterolemia, unspecified: Secondary | ICD-10-CM

## 2012-07-05 DIAGNOSIS — K449 Diaphragmatic hernia without obstruction or gangrene: Secondary | ICD-10-CM

## 2012-07-05 DIAGNOSIS — I498 Other specified cardiac arrhythmias: Secondary | ICD-10-CM

## 2012-07-05 DIAGNOSIS — I471 Supraventricular tachycardia: Secondary | ICD-10-CM

## 2012-07-05 DIAGNOSIS — K589 Irritable bowel syndrome without diarrhea: Secondary | ICD-10-CM

## 2012-07-05 DIAGNOSIS — I2510 Atherosclerotic heart disease of native coronary artery without angina pectoris: Secondary | ICD-10-CM

## 2012-07-05 DIAGNOSIS — I10 Essential (primary) hypertension: Secondary | ICD-10-CM

## 2012-07-05 DIAGNOSIS — I872 Venous insufficiency (chronic) (peripheral): Secondary | ICD-10-CM

## 2012-07-05 MED ORDER — FLUOCINONIDE 0.05 % EX CREA: TOPICAL_CREAM | CUTANEOUS | Status: DC
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
## 2012-07-05 NOTE — Patient Instructions (Addendum)
Today we updated your med list in our EPIC system...    Continue your current medications the same...  We reviewed your recent blood work & gave you a copy for your records...  Stay as active as poss & continue the [facility] exercises...  Call for any questions...  Let's plan a follow up visit in 68mo, sooner if needed for problems.Marland KitchenMarland Kitchen

## 2012-08-05 ENCOUNTER — Other Ambulatory Visit (INDEPENDENT_AMBULATORY_CARE_PROVIDER_SITE_OTHER): Payer: Medicare HMO

## 2012-08-05 DIAGNOSIS — I8000 Phlebitis and thrombophlebitis of superficial vessels of unspecified lower extremity: Secondary | ICD-10-CM

## 2012-08-16 ENCOUNTER — Other Ambulatory Visit: Payer: Self-pay | Admitting: Pulmonary Disease

## 2012-09-09 ENCOUNTER — Other Ambulatory Visit (INDEPENDENT_AMBULATORY_CARE_PROVIDER_SITE_OTHER): Payer: Medicare HMO

## 2012-09-09 DIAGNOSIS — I8000 Phlebitis and thrombophlebitis of superficial vessels of unspecified lower extremity: Secondary | ICD-10-CM

## 2012-09-10 ENCOUNTER — Telehealth: Payer: Self-pay | Admitting: Pulmonary Disease

## 2012-09-10 NOTE — Telephone Encounter (Signed)
Please notify patient> On Coumadin 5mg  tabs> taking 1x4d=tThSS and 1/2 x3d=MWF... Protime is stable, continue same, recheck 31mo....   I spoke with patient about results and she verbalized understanding and had no questions

## 2012-10-05 ENCOUNTER — Other Ambulatory Visit (INDEPENDENT_AMBULATORY_CARE_PROVIDER_SITE_OTHER): Payer: Medicare HMO

## 2012-10-05 DIAGNOSIS — I8000 Phlebitis and thrombophlebitis of superficial vessels of unspecified lower extremity: Secondary | ICD-10-CM

## 2012-10-14 ENCOUNTER — Other Ambulatory Visit: Payer: Self-pay | Admitting: Pulmonary Disease

## 2012-10-31 ENCOUNTER — Other Ambulatory Visit: Payer: Self-pay | Admitting: Pulmonary Disease

## 2012-11-03 ENCOUNTER — Other Ambulatory Visit: Payer: Self-pay | Admitting: Pulmonary Disease

## 2012-11-04 ENCOUNTER — Other Ambulatory Visit (INDEPENDENT_AMBULATORY_CARE_PROVIDER_SITE_OTHER): Payer: Medicare HMO

## 2012-11-04 DIAGNOSIS — I8000 Phlebitis and thrombophlebitis of superficial vessels of unspecified lower extremity: Secondary | ICD-10-CM

## 2012-11-04 LAB — PROTIME-INR: INR: 1.7 ratio — ABNORMAL HIGH (ref 0.8–1.0)

## 2012-11-11 ENCOUNTER — Other Ambulatory Visit: Payer: Self-pay | Admitting: Internal Medicine

## 2012-11-12 NOTE — Telephone Encounter (Signed)
Rx was sent to pharmacy electronically. 

## 2012-11-21 ENCOUNTER — Encounter (HOSPITAL_COMMUNITY): Payer: Self-pay | Admitting: Emergency Medicine

## 2012-11-21 ENCOUNTER — Emergency Department (HOSPITAL_COMMUNITY)
Admission: EM | Admit: 2012-11-21 | Discharge: 2012-11-21 | Disposition: A | Discharge status: Home / Self Care | Payer: Medicare HMO | Source: Home / Self Care | Attending: Emergency Medicine | Admitting: Emergency Medicine

## 2012-11-21 DIAGNOSIS — M25579 Pain in unspecified ankle and joints of unspecified foot: Secondary | ICD-10-CM

## 2012-11-21 DIAGNOSIS — M25572 Pain in left ankle and joints of left foot: Secondary | ICD-10-CM

## 2012-11-21 MED ORDER — ACETAMINOPHEN 500 MG PO TABS: 500.0000 mg | ORAL_TABLET | Freq: Four times a day (QID) | ORAL | Status: DC | PRN

## 2012-11-21 NOTE — ED Notes (Signed)
Pt c/o of foot feeling abnormal x 1 week. Denies swelling, numbness or tingling. Not really painful but she is worried about a blood clot. Pt reports she is traveling and just wants to be sure. Pt is alert and oriented in no acute distress.

## 2012-11-21 NOTE — ED Provider Notes (Signed)
CSN: [id number]     Arrival date & time 11/21/12  1201 History     First MD Initiated Contact with Patient 11/21/12 1226     Chief Complaint  Patient presents with  . Foot Pain   (Consider location/radiation/quality/duration/timing/severity/associated sxs/prior Treatment) HPI Comments:   Patient presents urgent care this morning with left medial ankle pain This started about 5-6 days ago. Patient denies any recent injury or portion of her ankle. She describes that the pain started in the anterior aspect of her left knee that has resolved since then but now has pain when she walks on her left ankle on the medial aspect of it. Denies any swelling, tingling or numbness sensations. She was told by a friend or relative that she needed to make sure that she doesn't have a " blood clot in her foot or ankle". Patient denies any redness or soft tissue swelling. She has been on Coumadin for long period of time. Denies any previous history of DVTs or pulmonary embolism. Denies any Calf or upper extremity pain, nor any swelling        Patient is a 78 y.o. female presenting with lower extremity pain.  Foot Pain This is a new problem. The current episode started 2 days ago. The problem occurs constantly. The problem has not changed since onset.Pertinent negatives include no chest pain and no shortness of breath. The symptoms are aggravated by walking. The treatment provided no relief.    Past Medical History  Diagnosis Date  . Bronchitis, not specified as acute or chronic   . Mitral valve disorders   . Unspecified essential hypertension   . Unspecified venous (peripheral) insufficiency   . Phlebitis and thrombophlebitis of superficial vessels of lower extremities   . Pure hypercholesterolemia   . Other abnormal glucose   . Diaphragmatic hernia without mention of obstruction or gangrene   . Irritable bowel syndrome   . Osteoarthrosis, unspecified whether generalized or localized, unspecified  site   . Osteoporosis, unspecified   . Unspecified vitamin D deficiency   . Anxiety state, unspecified    Past Surgical History  Procedure Laterality Date  . Vesicovaginal fistula closure w/ tah     Family History  Problem Relation Age of Onset  . Stroke Mother   . Stroke Father   . Stroke Brother    History  Substance Use Topics  . Smoking status: Never Smoker   . Smokeless tobacco: Not on file  . Alcohol Use: No   OB History   Grav Para Term Preterm Abortions TAB SAB Ect Mult Living                 Review of Systems  Constitutional: Negative for chills and appetite change.  Respiratory: Negative for shortness of breath.   Cardiovascular: Negative for chest pain and leg swelling.  Musculoskeletal: Positive for arthralgias. Negative for myalgias, back pain, joint swelling and gait problem.  Skin: Negative for color change, pallor, rash and wound.  Neurological: Negative for weakness and numbness.    Allergies  Clarithromycin; Guaifenesin er; Metformin; and Tape  Home Medications   Current Outpatient Rx  Name  Route  Sig  Dispense  Refill  . acetaminophen (TYLENOL) 325 MG tablet   Oral   Take 650 mg by mouth every 6 (six) hours as needed.           [name] acetaminophen (TYLENOL) 500 MG tablet   Oral   Take 1 tablet (500 mg total) by mouth every  6 (six) hours as needed for pain.   30 tablet   0   . alendronate (FOSAMAX) 70 MG tablet      take 1 tablet by mouth every week   4 tablet   11   . ALPRAZolam (XANAX) 0.5 MG tablet      take 1/2-1 tablet by mouth three times a day if needed   90 tablet   5   . alum & mag hydroxide-simeth (MAALOX ADVANCED MAX ST) 400-400-40 MG/5ML suspension   Oral   Take 15 mLs by mouth as needed.           [name] amLODipine (NORVASC) 10 MG tablet   Oral   Take 10 mg by mouth daily.           [name] aspirin 81 MG tablet   Oral   Take 81 mg by mouth daily.           . diclofenac sodium (VOLTAREN) 1 % GEL      Apply to left  ankle four times per day per Dr. Leron Croak         . dicyclomine (BENTYL) 20 MG tablet      take 1 tablet by mouth every 6 hours if needed for ABDOMINAL CRAMPS.   50 tablet   1   . fluocinonide cream (LIDEX) 0.05 %      Apply twice daily to rash as needed   30 g   5   . hydroxypropyl methylcellulose (ISOPTO TEARS) 2.5 % ophthalmic solution   Both Eyes   Place 1 drop into both eyes 2 (two) times daily.           . isosorbide dinitrate (ISORDIL) 30 MG tablet   Oral   Take 30 mg by mouth daily.           . isosorbide mononitrate (IMDUR) 30 MG 24 hr tablet      take 1 tablet by mouth once daily   30 tablet   9   . metoprolol succinate (TOPROL-XL) 25 MG 24 hr tablet   Oral   Take 25 mg by mouth 2 (two) times daily.           . ONE TOUCH ULTRA TEST test strip      TEST ONCE A DAY OR AS DIRECTED.   100 each   1   . pantoprazole (PROTONIX) 40 MG tablet   Oral   Take 40 mg by mouth daily.           . simvastatin (ZOCOR) 20 MG tablet   Oral   Take 1 tablet (20 mg total) by mouth at bedtime.   30 tablet   11   . traMADol (ULTRAM) 50 MG tablet      take 1 tablet by mouth three times a day for pain . NOT TO EXCEED 3 TABLETS DAILY.   90 tablet   4     Refill Approved   . Vitamin D, Ergocalciferol, (DRISDOL) 50000 UNITS CAPS      TAKE 1 CAPSULE BY MOUTH EVERY WEEK   4 capsule   5   . warfarin (COUMADIN) 5 MG tablet      take as directed   30 tablet   5    BP 145/89  Pulse 71  Temp(Src) 97.5 F (36.4 C) (Oral)  Resp 14  SpO2 97% Physical Exam  Nursing note and vitals reviewed. Constitutional: Vital signs are normal. She appears well-developed and well-nourished.  Non-toxic appearance.  She does not have a sickly appearance. She does not appear ill. No distress.  Musculoskeletal: She exhibits tenderness.       Feet:  Neurological: She is alert.  Skin: No rash noted. No erythema.    ED Course   Procedures (including critical care time)  Labs  Reviewed - No data to display No results found. 1. Ankle joint pain, left     MDM  Left ankle pain  Exam was most consistent with pain originating from them medial malleolar region possibly related to arthritis. Other differential diagnoses includes tendinitis of the foot, and ankle ligament sprain and gout. Patient exhibited no erythema, or soft tissue swelling. Have no suspicion at this point of a DVT. Encourage patient to use an Ace wrap- and to take Tylenol arthritis. We have also discussed symptoms that should warrant further evaluation the emergency department as per her concern of a DVT. Increased swelling Calf pain.  chest pains or shortness of breath.  Patient agree with treatment plan and followup care as discussed. Tylenol Discharge Medication List as of 11/21/2012  1:10 PM      Jimmie Molly, MD 11/21/12 1414

## 2012-11-30 ENCOUNTER — Other Ambulatory Visit: Payer: Self-pay | Admitting: *Deleted

## 2012-11-30 MED ORDER — AMLODIPINE BESYLATE 10 MG PO TABS: 10.0000 mg | ORAL_TABLET | Freq: Every day | ORAL | Status: DC

## 2012-12-09 ENCOUNTER — Other Ambulatory Visit: Payer: Self-pay | Admitting: Internal Medicine

## 2012-12-09 ENCOUNTER — Other Ambulatory Visit (INDEPENDENT_AMBULATORY_CARE_PROVIDER_SITE_OTHER): Payer: Medicare HMO

## 2012-12-09 DIAGNOSIS — I8000 Phlebitis and thrombophlebitis of superficial vessels of unspecified lower extremity: Secondary | ICD-10-CM

## 2012-12-09 LAB — PROTIME-INR
INR: 1.9 ratio — ABNORMAL HIGH (ref 0.8–1.0)
Prothrombin Time: 19.4 s — ABNORMAL HIGH (ref 10.2–12.4)

## 2012-12-10 ENCOUNTER — Telehealth: Payer: Self-pay | Admitting: Pulmonary Disease

## 2012-12-10 NOTE — Telephone Encounter (Signed)
Called and lm with family to have the pt call back. i also called the pts cell and [email or web address]

## 2012-12-10 NOTE — Telephone Encounter (Signed)
Rx was sent to pharmacy electronically. 

## 2012-12-14 NOTE — Telephone Encounter (Signed)
I spoke with patient about results and she verbalized understanding and had no questions 

## 2012-12-14 NOTE — Telephone Encounter (Signed)
Notes Recorded by Marcellus Scott, CMA on 12/10/2012 at 11:43 AM [email or web address] ------  Notes Recorded by Michele Mcalpine, MD on 12/10/2012 at 8:58 AM Please notify patient> On Coumadin 5mg  tabs>> Taking 1x4d=TThSS, and 1/2 x3d=MWF... Continue same & recheck one month...  [id number].Carron Curie, CMA

## 2012-12-14 NOTE — Telephone Encounter (Signed)
Pt returning call [phone] or call [phone]

## 2013-01-06 ENCOUNTER — Other Ambulatory Visit (INDEPENDENT_AMBULATORY_CARE_PROVIDER_SITE_OTHER): Payer: Medicare HMO

## 2013-01-06 ENCOUNTER — Other Ambulatory Visit: Payer: Self-pay | Admitting: Pulmonary Disease

## 2013-01-06 DIAGNOSIS — I8000 Phlebitis and thrombophlebitis of superficial vessels of unspecified lower extremity: Secondary | ICD-10-CM

## 2013-01-06 LAB — PROTIME-INR: INR: 1.5 ratio — ABNORMAL HIGH (ref 0.8–1.0)

## 2013-01-11 ENCOUNTER — Encounter: Payer: Self-pay | Admitting: Pulmonary Disease

## 2013-01-11 ENCOUNTER — Ambulatory Visit (INDEPENDENT_AMBULATORY_CARE_PROVIDER_SITE_OTHER): Payer: Medicare HMO | Admitting: Pulmonary Disease

## 2013-01-11 VITALS — BP 132/62 | HR 57 | Temp 97.9°F | Ht 65.5 in | Wt 182.6 lb

## 2013-01-11 DIAGNOSIS — I2510 Atherosclerotic heart disease of native coronary artery without angina pectoris: Secondary | ICD-10-CM

## 2013-01-11 DIAGNOSIS — K589 Irritable bowel syndrome without diarrhea: Secondary | ICD-10-CM

## 2013-01-11 DIAGNOSIS — R7309 Other abnormal glucose: Secondary | ICD-10-CM

## 2013-01-11 DIAGNOSIS — I872 Venous insufficiency (chronic) (peripheral): Secondary | ICD-10-CM

## 2013-01-11 DIAGNOSIS — Z23 Encounter for immunization: Secondary | ICD-10-CM

## 2013-01-11 DIAGNOSIS — I10 Essential (primary) hypertension: Secondary | ICD-10-CM

## 2013-01-11 DIAGNOSIS — M810 Age-related osteoporosis without current pathological fracture: Secondary | ICD-10-CM

## 2013-01-11 DIAGNOSIS — M1990 Unspecified osteoarthritis, unspecified site: Secondary | ICD-10-CM

## 2013-01-11 DIAGNOSIS — I498 Other specified cardiac arrhythmias: Secondary | ICD-10-CM

## 2013-01-11 DIAGNOSIS — E7800 Pure hypercholesterolemia, unspecified: Secondary | ICD-10-CM

## 2013-01-11 DIAGNOSIS — K449 Diaphragmatic hernia without obstruction or gangrene: Secondary | ICD-10-CM

## 2013-01-11 DIAGNOSIS — F411 Generalized anxiety disorder: Secondary | ICD-10-CM

## 2013-01-11 DIAGNOSIS — I471 Supraventricular tachycardia: Secondary | ICD-10-CM
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
## 2013-01-11 NOTE — Patient Instructions (Addendum)
Today we updated your med list in our EPIC system...    Continue your current medications the same...  Today we gave you the 2014 FLU vaccine...  Stay as active as possible...  Call for any questions...  Let's plan a follow up visit in 77mo w/ FASTING blood work at that time.Marland KitchenMarland Kitchen

## 2013-02-04 ENCOUNTER — Encounter: Payer: Self-pay | Admitting: *Deleted

## 2013-02-07 ENCOUNTER — Other Ambulatory Visit (INDEPENDENT_AMBULATORY_CARE_PROVIDER_SITE_OTHER): Payer: Medicare HMO

## 2013-02-07 DIAGNOSIS — I8000 Phlebitis and thrombophlebitis of superficial vessels of unspecified lower extremity: Secondary | ICD-10-CM

## 2013-02-07 LAB — PROTIME-INR: Prothrombin Time: 16 s — ABNORMAL HIGH (ref 10.2–12.4)

## 2013-02-08 ENCOUNTER — Encounter: Payer: Self-pay | Admitting: Internal Medicine

## 2013-02-09 ENCOUNTER — Ambulatory Visit (INDEPENDENT_AMBULATORY_CARE_PROVIDER_SITE_OTHER): Payer: Medicare HMO | Admitting: Internal Medicine

## 2013-02-09 ENCOUNTER — Encounter: Payer: Self-pay | Admitting: Internal Medicine

## 2013-02-09 VITALS — BP 126/76 | HR 65 | Ht 66.0 in | Wt 179.1 lb

## 2013-02-09 DIAGNOSIS — I872 Venous insufficiency (chronic) (peripheral): Secondary | ICD-10-CM

## 2013-02-09 DIAGNOSIS — E7800 Pure hypercholesterolemia, unspecified: Secondary | ICD-10-CM

## 2013-02-09 DIAGNOSIS — I10 Essential (primary) hypertension: Secondary | ICD-10-CM

## 2013-02-09 DIAGNOSIS — I498 Other specified cardiac arrhythmias: Secondary | ICD-10-CM

## 2013-02-09 DIAGNOSIS — R7309 Other abnormal glucose: Secondary | ICD-10-CM

## 2013-02-09 DIAGNOSIS — I471 Supraventricular tachycardia: Secondary | ICD-10-CM

## 2013-02-09 DIAGNOSIS — I2510 Atherosclerotic heart disease of native coronary artery without angina pectoris: Secondary | ICD-10-CM

## 2013-02-09 NOTE — Patient Instructions (Signed)
Your physician wants you to follow-up in: 6 months. You will receive a reminder letter in the mail two months in advance. If you don't receive a letter, please call our office to schedule the follow-up appointment.  Take magnesium-oxide OTC for cramps

## 2013-02-09 NOTE — Progress Notes (Signed)
OFFICE NOTE  Chief Complaint:  Routine follow-up  Primary Care Physician: Michele Mcalpine, MD  HPI:  [name]  is a 78 year old female with a history of paroxysmal atrial fibrillation and SVT in the past. This happened when she underwent a Myoview for chest pain in the hospital. She was placed on metoprolol succinate 25 b.i.d. and has done well with that with improvement in her palpitations. Her echo showed an EF greater than 55% with some mild aortic insufficiency and an aneurysmal atrial septum. She is on simvastatin as well as Coumadin managed by you. She's recently had some abnormal labs which showed borderline diabetes. She's not currently a medication for that. She reports occasional intermittent chest pain. She underwent a cardia catheterization in the past which showed mild to moderate nonobstructive coronary disease which is managed medically. She does also have bilateral venous insufficiency of the lower chin and knees and has worn compression stockings with improvement in her swelling and symptoms.  PMHx:  Past Medical History  Diagnosis Date  . Bronchitis, not specified as acute or chronic   . Mitral valve disorders   . Essential hypertension   . Unspecified venous (peripheral) insufficiency   . Phlebitis and thrombophlebitis of superficial vessels of lower extremities   . Dyslipidemia   . Other abnormal glucose   . Diaphragmatic hernia without mention of obstruction or gangrene   . Irritable bowel syndrome   . Osteoarthrosis, unspecified whether generalized or localized, unspecified site   . Osteoporosis, unspecified   . Unspecified vitamin D deficiency   . Anxiety state, unspecified   . History of palpitations   . CAD (coronary artery disease)   . PAF (paroxysmal atrial fibrillation)   . History of nuclear stress test 07/2010    lexiscan - no perfusion defects     Past Surgical History  Procedure Laterality Date  . Vesicovaginal fistula closure w/ tah    .  Cardiac catheterization  07/30/2010    nonobstructive CAD, 20-40% RCA stenosis, 50-60% eccentric LAD stenosis more prox, 40% LAD stenosis more distal, EF 65%  . Transthoracic echocardiogram  09/03/2010    EF=>55%; mild MR; mod TR; AV mildly sclerotic with trace regurg; mild pulm valve regurg    FAMHx:  Family History  Problem Relation Age of Onset  . Stroke Mother   . Stroke Father     also heart disease  . Stroke Brother     also heart disease  . Clotting disorder Paternal Grandmother     blood clot    SOCHx:   reports that she has never smoked. She has never used smokeless tobacco. She reports that she does not drink alcohol or use illicit drugs.  ALLERGIES:  Allergies  Allergen Reactions  . Clarithromycin     REACTION: unspecified  . Guaifenesin Er     itching  . Metformin     REACTION: pt states INTOL to Metformin "felt drained"  . Tape     Adhesive tape---rash    ROS: A comprehensive review of systems was negative except for: Respiratory: positive for dyspnea on exertion Cardiovascular: positive for lower extremity edema and orthopnea  HOME MEDS: Current Outpatient Prescriptions  Medication Sig Dispense Refill  . acetaminophen (TYLENOL) 500 MG tablet Take 1 tablet (500 mg total) by mouth every 6 (six) hours as needed for pain.  30 tablet  0  . alendronate (FOSAMAX) 70 MG tablet take 1 tablet by mouth every week  4 tablet  11  . ALPRAZolam (  XANAX) 0.5 MG tablet take 1/2-1 tablet by mouth three times a day if needed  90 tablet  5  . alum & mag hydroxide-simeth (MAALOX ADVANCED MAX ST) 400-400-40 MG/5ML suspension Take 15 mLs by mouth as needed.        [name] amLODipine (NORVASC) 10 MG tablet Take 1 tablet (10 mg total) by mouth daily.  30 tablet  6  . aspirin 81 MG tablet Take 81 mg by mouth daily.        . diclofenac sodium (VOLTAREN) 1 % GEL Apply to left ankle four times per day per Dr. Leron Croak      . dicyclomine (BENTYL) 20 MG tablet take 1 tablet by mouth every 6 hours  if needed for ABDOMINAL CRAMPS.  50 tablet  1  . fluocinonide cream (LIDEX) 0.05 % Apply twice daily to rash as needed  30 g  5  . hydroxypropyl methylcellulose (ISOPTO TEARS) 2.5 % ophthalmic solution Place 1 drop into both eyes 2 (two) times daily.        . isosorbide mononitrate (IMDUR) 30 MG 24 hr tablet take 1 tablet by mouth once daily  30 tablet  9  . metoprolol succinate (TOPROL-XL) 25 MG 24 hr tablet Take 1 tablet (25 mg total) by mouth 2 (two) times daily.  60 tablet  9  . ONE TOUCH ULTRA TEST test strip TEST ONCE A DAY OR AS DIRECTED.  100 each  1  . pantoprazole (PROTONIX) 40 MG tablet Take 40 mg by mouth daily.        . simvastatin (ZOCOR) 20 MG tablet take 1 tablet by mouth at bedtime  30 tablet  11  . traMADol (ULTRAM) 50 MG tablet take 1 tablet by mouth three times a day for pain . NOT TO EXCEED 3 TABLETS DAILY.  90 tablet  4  . Vitamin D, Ergocalciferol, (DRISDOL) 50000 UNITS CAPS capsule take 1 capsule by mouth every week  4 capsule  5  . warfarin (COUMADIN) 5 MG tablet take as directed  30 tablet  5   No current facility-administered medications for this visit.    LABS/IMAGING: Results for orders placed in visit on 02/07/13 (from the past 48 hour(s))  PROTIME-INR     Status: Abnormal   Collection Time    02/07/13 12:47 PM      Result Value Range   Prothrombin Time 16.0 (*) 10.2 - 12.4 sec   INR 1.5 (*) 0.8 - 1.0 ratio   No results found.  VITALS: BP 126/76  Pulse 65  Ht 5\' 6"  (1.676 m)  Wt 179 lb 1.6 oz (81.239 kg)  BMI 28.92 kg/m2  EXAM: General appearance: alert and no distress Neck: no carotid bruit and no JVD Lungs: clear to auscultation bilaterally Heart: regular rate and rhythm, S1, S2 normal, no murmur, click, rub or gallop Abdomen: soft, non-tender; bowel sounds normal; no masses,  no organomegaly and overweight Extremities: extremities normal, atraumatic, no cyanosis or edema Pulses: 2+ and symmetric Skin: Skin color, texture, turgor normal. No  rashes or lesions Neurologic: Grossly normal Psych: Mood, affect normal  EKG: Normal sinus rhythm at 78  ASSESSMENT: 1. Atrial fibrillation on warfarin 2. Hypertension-controlled 3. Dyslipidemia-on statin 4. Overweight 5. Moderate, non-obstructive CAD by cath in 2012 6. PSVT - controlled by b-blocker and vagal maneuvers  PLAN: 1.   Ms. Cottam is doing well overall with relatively infrequent occurrences of paroxysmal tachyarrhythmias. She's had no further significant cardiac chest pain. Her cholesterol is managed and  she is currently on simvastatin. Repeat fasting laboratory work is pending through Dr. Cydney Ok office. Her also awaiting a recheck of her fasting blood glucose and A1c is she was borderline diabetic and may benefit from treatment for this. Overall she is doing well and reports are warfarin levels are within therapeutic range. I do see her back in 6 months or sooner as necessary.  Chrystie Nose, MD, [facility] Attending Cardiologist [facility]  HILTY,Kenneth C 02/09/2013, 12:44 PM

## 2013-02-15 ENCOUNTER — Other Ambulatory Visit: Payer: Self-pay | Admitting: Internal Medicine

## 2013-02-15 NOTE — Telephone Encounter (Signed)
Rx was sent to pharmacy electronically. 

## 2013-02-17 ENCOUNTER — Other Ambulatory Visit: Payer: Self-pay | Admitting: Internal Medicine

## 2013-03-03 ENCOUNTER — Other Ambulatory Visit: Payer: Self-pay | Admitting: Pulmonary Disease

## 2013-03-14 ENCOUNTER — Other Ambulatory Visit (INDEPENDENT_AMBULATORY_CARE_PROVIDER_SITE_OTHER): Payer: Medicare HMO

## 2013-03-14 DIAGNOSIS — I8000 Phlebitis and thrombophlebitis of superficial vessels of unspecified lower extremity: Secondary | ICD-10-CM

## 2013-03-14 LAB — PROTIME-INR: Prothrombin Time: 16.6 s — ABNORMAL HIGH (ref 10.2–12.4)

## 2013-04-01 ENCOUNTER — Other Ambulatory Visit: Payer: Self-pay | Admitting: Pulmonary Disease

## 2013-04-13 ENCOUNTER — Other Ambulatory Visit (INDEPENDENT_AMBULATORY_CARE_PROVIDER_SITE_OTHER): Payer: Medicare HMO

## 2013-04-13 DIAGNOSIS — I8000 Phlebitis and thrombophlebitis of superficial vessels of unspecified lower extremity: Secondary | ICD-10-CM

## 2013-04-13 LAB — PROTIME-INR
INR: 1.7 ratio — ABNORMAL HIGH (ref 0.8–1.0)
Prothrombin Time: 17.9 s — ABNORMAL HIGH (ref 10.2–12.4)

## 2013-04-20 ENCOUNTER — Other Ambulatory Visit: Payer: Self-pay | Admitting: Pulmonary Disease

## 2013-05-05 ENCOUNTER — Other Ambulatory Visit: Payer: Self-pay | Admitting: Pulmonary Disease

## 2013-05-05 DIAGNOSIS — Z1231 Encounter for screening mammogram for malignant neoplasm of breast: Secondary | ICD-10-CM

## 2013-05-12 ENCOUNTER — Other Ambulatory Visit (INDEPENDENT_AMBULATORY_CARE_PROVIDER_SITE_OTHER): Payer: Medicare HMO

## 2013-05-12 DIAGNOSIS — I8000 Phlebitis and thrombophlebitis of superficial vessels of unspecified lower extremity: Secondary | ICD-10-CM

## 2013-05-12 LAB — PROTIME-INR
INR: 1.3 ratio — AB (ref 0.8–1.0)
Prothrombin Time: 13.3 s — ABNORMAL HIGH (ref 10.2–12.4)

## 2013-05-24 ENCOUNTER — Ambulatory Visit (HOSPITAL_COMMUNITY)
Admission: RE | Admit: 2013-05-24 | Discharge: 2013-05-24 | Disposition: A | Discharge status: Home / Self Care | Payer: Medicare HMO | Source: Ambulatory Visit | Attending: Pulmonary Disease | Admitting: Pulmonary Disease

## 2013-05-24 DIAGNOSIS — Z1231 Encounter for screening mammogram for malignant neoplasm of breast: Secondary | ICD-10-CM | POA: Insufficient documentation

## 2013-05-31 ENCOUNTER — Other Ambulatory Visit: Payer: Self-pay | Admitting: Pulmonary Disease

## 2013-06-08 ENCOUNTER — Other Ambulatory Visit (INDEPENDENT_AMBULATORY_CARE_PROVIDER_SITE_OTHER): Payer: Commercial Managed Care - HMO

## 2013-06-08 DIAGNOSIS — I8000 Phlebitis and thrombophlebitis of superficial vessels of unspecified lower extremity: Secondary | ICD-10-CM

## 2013-06-08 LAB — PROTIME-INR
INR: 1.8 ratio — AB (ref 0.8–1.0)
PROTHROMBIN TIME: 19 s — AB (ref 10.2–12.4)

## 2013-06-18 ENCOUNTER — Other Ambulatory Visit: Payer: Self-pay | Admitting: Internal Medicine

## 2013-06-20 NOTE — Telephone Encounter (Signed)
Rx was sent to pharmacy electronically. 

## 2013-06-22 ENCOUNTER — Telehealth: Payer: Self-pay | Admitting: Pulmonary Disease

## 2013-06-22 DIAGNOSIS — E7800 Pure hypercholesterolemia, unspecified: Secondary | ICD-10-CM

## 2013-06-22 DIAGNOSIS — I10 Essential (primary) hypertension: Secondary | ICD-10-CM

## 2013-06-22 NOTE — Telephone Encounter (Signed)
Called and spoke with pt and she is aware that SN will not be seeing primary care as of April 1.   She has requested to be set up with primary care at the [place] office.  Nothing further is needed.

## 2013-06-22 NOTE — Telephone Encounter (Signed)
i do not see anything in epic. Please advise leigh if you tried calling pt? thanks

## 2013-06-23 ENCOUNTER — Other Ambulatory Visit: Payer: Self-pay | Admitting: Pulmonary Disease

## 2013-06-24 ENCOUNTER — Telehealth: Payer: Self-pay | Admitting: Pulmonary Disease

## 2013-06-24 NOTE — Telephone Encounter (Signed)
[place] FOR NEW PRIMARY CARE DR. Dominica Severin THE MAILED IF POSSIBE

## 2013-06-24 NOTE — Telephone Encounter (Signed)
According to epic she is scheduled to see Dr. Alain Marion 07/26/13. Called and spoke with pt as her daughter has left. She is aware of who she is scheduled with. She needed nothing further

## 2013-06-27 ENCOUNTER — Telehealth: Payer: Self-pay | Admitting: Internal Medicine

## 2013-06-27 NOTE — Telephone Encounter (Signed)
Her primary doctor is stopping practice(Dr.Nadel) and wants to know if Dr.Hilty work these physicans : Dr. Netta Cedars, Dr.Alex Plotnikov, and Dr.Thomas Ronnald Ramp

## 2013-06-27 NOTE — Telephone Encounter (Signed)
Returned call to patient's daughter. Was wondering if Dr. Debara Pickett works with any of the doctors states below, as Dr. Lenna Gilford is leaving. Informed daughter that whomever she decided to go with for primary care, it will be updated in out system so that notes, etc can be communicated appropriately. Also inquired of coumadin monitoring, as Dr. Lenna Gilford managed this previously but they are concerned that the other providers may not be able to. Informed that we have a coumadin clinic and if they should decide to transfer this aspect of the patient's care to contact our office. Will informed K. [place] Pharmacist as Juluis Rainier

## 2013-06-30 ENCOUNTER — Telehealth: Payer: Self-pay | Admitting: *Deleted

## 2013-06-30 ENCOUNTER — Telehealth: Payer: Self-pay | Admitting: Pulmonary Disease

## 2013-06-30 DIAGNOSIS — I8000 Phlebitis and thrombophlebitis of superficial vessels of unspecified lower extremity: Secondary | ICD-10-CM

## 2013-06-30 NOTE — Telephone Encounter (Signed)
Pt was calling in regards to her coumadin.   [place]

## 2013-06-30 NOTE — Telephone Encounter (Signed)
Returned call and pt verified x 2.  Pt stated Dr. Lenna Gilford won't be her primary doctor anymore and she was calling to see if our office has a Coumadin Clinic.  Pt informed we do have a Coumadin clinic and instructions given as stated below.  Pt stated she will see Dr. Lenna Gilford next week and will talk to him about it.  Pt verbalized understanding of process to transfer records and that she will need an appt.

## 2013-06-30 NOTE — Telephone Encounter (Signed)
Returned call.  No answer/voicemail.  Will try later.  Need to know what concerns pt has r/t Coumadin.  If pt is interested in being followed in our Coumadin Clinic, then she will need to have records transferred to our office to [facility] attention (PharmD) and schedule and appt.

## 2013-06-30 NOTE — Telephone Encounter (Signed)
Called and spoke with pt and she is aware that we will check her coumadin the week of 3-23.  She is aware that we will call her with these results once they are back and she stated that she may try and get cardiology to start to follow up her for the coumadin checks.

## 2013-06-30 NOTE — Telephone Encounter (Signed)
Pt has called again. °

## 2013-07-07 ENCOUNTER — Other Ambulatory Visit (INDEPENDENT_AMBULATORY_CARE_PROVIDER_SITE_OTHER): Payer: Medicare HMO

## 2013-07-07 DIAGNOSIS — I8000 Phlebitis and thrombophlebitis of superficial vessels of unspecified lower extremity: Secondary | ICD-10-CM

## 2013-07-07 LAB — PROTIME-INR
INR: 2.1 ratio — ABNORMAL HIGH (ref 0.8–1.0)
Prothrombin Time: 21.5 s — ABNORMAL HIGH (ref 10.2–12.4)

## 2013-07-07 NOTE — Addendum Note (Signed)
Addended by: Lorane Gell on: 07/07/2013 04:12 PM   Modules accepted: Orders

## 2013-07-07 NOTE — Telephone Encounter (Signed)
Lab called stating that PT/INR order was not placed inh EPIC and patient is here for labs. I have placed order for patient and lab is aware. Nothing more needed at this time.

## 2013-07-12 ENCOUNTER — Telehealth: Payer: Self-pay | Admitting: Pharmacist Clinician (PhC)/ Clinical Pharmacy Specialist

## 2013-07-12 ENCOUNTER — Telehealth: Payer: Self-pay | Admitting: *Deleted

## 2013-07-12 NOTE — Telephone Encounter (Signed)
Message copied by Rockne Menghini on Tue Jul 12, 2013  2:39 PM ------      Message from: Lauralee Evener.      Created: Tue Jul 12, 2013  2:05 PM       This patient would like to be set up to have you to follow her coumadin. Dr. Lenna Gilford followed previously and will be leaving his practice. She had her last INR done on 07/07/13 and states it is done monthly. She is a patient of Dr. Lysbeth Penner. Please call to schedule. ------

## 2013-07-12 NOTE — Telephone Encounter (Signed)
Returned a call to patient. She wants to start getting her INR checked through our coumadin clinic here. She has previously been having this done by Dr. Lenna Gilford, however he will be leaving his current practice. Will send a mesage to Cyril Mourning to get patient started.

## 2013-07-12 NOTE — Telephone Encounter (Signed)
Pt was calling in regards to some appointments. She sounded very confused. She wanted to talk to someone about her coumadin.  [place]

## 2013-07-19 ENCOUNTER — Ambulatory Visit: Payer: Medicare HMO | Admitting: Pulmonary Disease

## 2013-07-26 ENCOUNTER — Encounter: Payer: Self-pay | Admitting: Internal Medicine

## 2013-07-26 ENCOUNTER — Ambulatory Visit (INDEPENDENT_AMBULATORY_CARE_PROVIDER_SITE_OTHER): Payer: Commercial Managed Care - HMO | Admitting: Internal Medicine

## 2013-07-26 VITALS — BP 134/72 | HR 86 | Temp 97.8°F | Ht 66.0 in | Wt 183.0 lb

## 2013-07-26 DIAGNOSIS — I2510 Atherosclerotic heart disease of native coronary artery without angina pectoris: Secondary | ICD-10-CM

## 2013-07-26 DIAGNOSIS — I10 Essential (primary) hypertension: Secondary | ICD-10-CM

## 2013-07-26 DIAGNOSIS — M810 Age-related osteoporosis without current pathological fracture: Secondary | ICD-10-CM

## 2013-07-26 DIAGNOSIS — E7800 Pure hypercholesterolemia, unspecified: Secondary | ICD-10-CM

## 2013-07-26 DIAGNOSIS — M1990 Unspecified osteoarthritis, unspecified site: Secondary | ICD-10-CM

## 2013-07-26 DIAGNOSIS — R7309 Other abnormal glucose: Secondary | ICD-10-CM

## 2013-07-26 NOTE — Assessment & Plan Note (Signed)
Continue with current prescription therapy as reflected on the Med list.  

## 2013-07-26 NOTE — Assessment & Plan Note (Signed)
Continue with current diet Labs

## 2013-07-26 NOTE — Progress Notes (Signed)
   Subjective:    Patient ID: [name], female    DOB: 1934-04-24, 79 y.o.   MRN: [id number]  HPI  New pt -  Transfer from Dr Lenna Gilford  The patient presents for a follow-up of  chronic hypertension, chronic dyslipidemia, type 2 diabetes controlled with diet, CAD, GERD  Wt Readings from Last 3 Encounters:  07/26/13 183 lb (83.008 kg)  02/09/13 179 lb 1.6 oz (81.239 kg)  01/11/13 182 lb 9.6 oz (82.827 kg)   BP Readings from Last 3 Encounters:  07/26/13 134/72  02/09/13 126/76  01/11/13 132/62      Review of Systems  Constitutional: Negative for chills, activity change, appetite change, fatigue and unexpected weight change.  HENT: Negative for congestion, mouth sores, sinus pressure and trouble swallowing.   Eyes: Negative for visual disturbance.  Respiratory: Negative for cough, chest tightness and shortness of breath.   Cardiovascular: Negative for chest pain.  Gastrointestinal: Negative for nausea and abdominal pain.  Genitourinary: Negative for frequency, difficulty urinating and vaginal pain.  Musculoskeletal: Negative for back pain and gait problem.  Skin: Negative for pallor and rash.  Neurological: Negative for dizziness, tremors, weakness, numbness and headaches.  Psychiatric/Behavioral: Negative for suicidal ideas, confusion and sleep disturbance.       Objective:   Physical Exam  Constitutional: She appears well-developed. No distress.  HENT:  Head: Normocephalic.  Right Ear: External ear normal.  Left Ear: External ear normal.  Nose: Nose normal.  Mouth/Throat: Oropharynx is clear and moist.  Eyes: Conjunctivae are normal. Pupils are equal, round, and reactive to light. Right eye exhibits no discharge. Left eye exhibits no discharge.  Neck: Normal range of motion. Neck supple. No JVD present. No tracheal deviation present. No thyromegaly present.  Cardiovascular: Normal rate, regular rhythm and normal heart sounds.   Pulmonary/Chest: No stridor. No  respiratory distress. She has no wheezes.  Abdominal: Soft. Bowel sounds are normal. She exhibits no distension and no mass. There is no tenderness. There is no rebound and no guarding.  Musculoskeletal: She exhibits no edema and no tenderness.  Lymphadenopathy:    She has no cervical adenopathy.  Neurological: She displays normal reflexes. No cranial nerve deficit. She exhibits normal muscle tone. Coordination normal.  Skin: No rash noted. No erythema.  Psychiatric: She has a normal mood and affect. Her behavior is normal. Judgment and thought content normal.    Lab Results  Component Value Date   WBC 4.8 07/02/2012   HGB 12.7 07/02/2012   HCT 37.7 07/02/2012   PLT 243.0 07/02/2012   GLUCOSE 129* 07/02/2012   CHOL 174 07/02/2012   TRIG 99.0 07/02/2012   HDL 69.90 07/02/2012   LDLDIRECT 105.5 11/26/2010   LDLCALC 84 07/02/2012   ALT 18 07/02/2012   AST 24 07/02/2012   NA 138 07/02/2012   K 4.0 07/02/2012   CL 102 07/02/2012   CREATININE 1.1 07/02/2012   BUN 14 07/02/2012   CO2 30 07/02/2012   TSH 1.74 07/02/2012   INR 2.1* 07/07/2013   HGBA1C 6.7* 07/02/2012         Assessment & Plan:

## 2013-07-26 NOTE — Assessment & Plan Note (Signed)
Continue with current prescription therapy as reflected on the Med list. Labs  

## 2013-07-26 NOTE — Progress Notes (Signed)
Pre visit review using our clinic review tool, if applicable. No additional management support is needed unless otherwise documented below in the visit note. 

## 2013-07-26 NOTE — Assessment & Plan Note (Signed)
Vit D On Fosamax

## 2013-07-27 ENCOUNTER — Telehealth: Payer: Self-pay | Admitting: Internal Medicine

## 2013-07-27 NOTE — Telephone Encounter (Signed)
Relevant patient education mailed to patient.  

## 2013-07-31 ENCOUNTER — Encounter: Payer: Self-pay | Admitting: Internal Medicine

## 2013-08-04 ENCOUNTER — Other Ambulatory Visit (INDEPENDENT_AMBULATORY_CARE_PROVIDER_SITE_OTHER): Payer: Commercial Managed Care - HMO

## 2013-08-04 DIAGNOSIS — M1990 Unspecified osteoarthritis, unspecified site: Secondary | ICD-10-CM

## 2013-08-04 DIAGNOSIS — R7309 Other abnormal glucose: Secondary | ICD-10-CM

## 2013-08-04 DIAGNOSIS — M810 Age-related osteoporosis without current pathological fracture: Secondary | ICD-10-CM

## 2013-08-04 DIAGNOSIS — I10 Essential (primary) hypertension: Secondary | ICD-10-CM

## 2013-08-04 DIAGNOSIS — I2510 Atherosclerotic heart disease of native coronary artery without angina pectoris: Secondary | ICD-10-CM

## 2013-08-04 DIAGNOSIS — E7800 Pure hypercholesterolemia, unspecified: Secondary | ICD-10-CM

## 2013-08-04 LAB — URINALYSIS, ROUTINE W REFLEX MICROSCOPIC
BILIRUBIN URINE: NEGATIVE
Ketones, ur: NEGATIVE
Leukocytes, UA: NEGATIVE
Nitrite: NEGATIVE
Specific Gravity, Urine: 1.025 (ref 1.000–1.030)
Total Protein, Urine: NEGATIVE
UROBILINOGEN UA: 0.2 (ref 0.0–1.0)
Urine Glucose: NEGATIVE
pH: 6 (ref 5.0–8.0)

## 2013-08-04 LAB — LIPID PANEL
CHOLESTEROL: 191 mg/dL (ref 0–200)
HDL: 79.2 mg/dL (ref >39.00)
LDL CALC: 96 mg/dL (ref 0–99)
Total CHOL/HDL Ratio: 2
Triglycerides: 78 mg/dL (ref 0.0–149.0)
VLDL: 15.6 mg/dL (ref 0.0–40.0)

## 2013-08-04 LAB — CBC WITH DIFFERENTIAL/PLATELET
BASOS PCT: 0.3 % (ref 0.0–3.0)
Basophils Absolute: 0 10*3/uL (ref 0.0–0.1)
EOS ABS: 0.1 10*3/uL (ref 0.0–0.7)
Eosinophils Relative: 2.3 % (ref 0.0–5.0)
HEMATOCRIT: 39.8 % (ref 36.0–46.0)
Hemoglobin: 13.2 g/dL (ref 12.0–15.0)
LYMPHS PCT: 32.3 % (ref 12.0–46.0)
Lymphs Abs: 1.6 10*3/uL (ref 0.7–4.0)
MCHC: 33.2 g/dL (ref 30.0–36.0)
MCV: 88.8 fl (ref 78.0–100.0)
Monocytes Absolute: 0.4 10*3/uL (ref 0.1–1.0)
Monocytes Relative: 8.8 % (ref 3.0–12.0)
NEUTROS ABS: 2.7 10*3/uL (ref 1.4–7.7)
Neutrophils Relative %: 56.3 % (ref 43.0–77.0)
Platelets: 240 10*3/uL (ref 150.0–400.0)
RBC: 4.48 Mil/uL (ref 3.87–5.11)
RDW: 13.8 % (ref 11.5–14.6)
WBC: 4.8 10*3/uL (ref 4.5–10.5)

## 2013-08-04 LAB — HEPATIC FUNCTION PANEL
ALBUMIN: 4.1 g/dL (ref 3.5–5.2)
ALT: 15 U/L (ref 0–35)
AST: 19 U/L (ref 0–37)
Alkaline Phosphatase: 58 U/L (ref 39–117)
Bilirubin, Direct: 0 mg/dL (ref 0.0–0.3)
Total Bilirubin: 0.6 mg/dL (ref 0.3–1.2)
Total Protein: 7.5 g/dL (ref 6.0–8.3)

## 2013-08-04 LAB — TSH: TSH: 1.04 u[IU]/mL (ref 0.35–5.50)

## 2013-08-04 LAB — BASIC METABOLIC PANEL
BUN: 14 mg/dL (ref 6–23)
CO2: 30 mEq/L (ref 19–32)
Calcium: 9.4 mg/dL (ref 8.4–10.5)
Chloride: 104 mEq/L (ref 96–112)
Creatinine, Ser: 0.9 mg/dL (ref 0.4–1.2)
GFR: 77.13 mL/min (ref >60.00)
Glucose, Bld: 124 mg/dL — ABNORMAL HIGH (ref 70–99)
Potassium: 4.2 mEq/L (ref 3.5–5.1)
SODIUM: 138 meq/L (ref 135–145)

## 2013-08-04 LAB — HEMOGLOBIN A1C: HEMOGLOBIN A1C: 6.7 % — AB (ref 4.6–6.5)

## 2013-08-05 ENCOUNTER — Ambulatory Visit (INDEPENDENT_AMBULATORY_CARE_PROVIDER_SITE_OTHER): Payer: Commercial Managed Care - HMO | Admitting: Pharmacist Clinician (PhC)/ Clinical Pharmacy Specialist

## 2013-08-05 DIAGNOSIS — Z7901 Long term (current) use of anticoagulants: Secondary | ICD-10-CM

## 2013-08-05 DIAGNOSIS — I4891 Unspecified atrial fibrillation: Secondary | ICD-10-CM

## 2013-08-05 LAB — VITAMIN D 25 HYDROXY (VIT D DEFICIENCY, FRACTURES): Vit D, 25-Hydroxy: 42 ng/mL (ref 30–89)

## 2013-08-05 LAB — POCT INR: INR: 1.6

## 2013-08-19 ENCOUNTER — Ambulatory Visit (INDEPENDENT_AMBULATORY_CARE_PROVIDER_SITE_OTHER): Payer: Commercial Managed Care - HMO | Admitting: Pharmacist Clinician (PhC)/ Clinical Pharmacy Specialist

## 2013-08-19 DIAGNOSIS — Z7901 Long term (current) use of anticoagulants: Secondary | ICD-10-CM

## 2013-08-19 DIAGNOSIS — I4891 Unspecified atrial fibrillation: Secondary | ICD-10-CM

## 2013-08-19 LAB — POCT INR: INR: 2.2

## 2013-08-30 ENCOUNTER — Ambulatory Visit (INDEPENDENT_AMBULATORY_CARE_PROVIDER_SITE_OTHER): Payer: Commercial Managed Care - HMO | Admitting: Pharmacist Clinician (PhC)/ Clinical Pharmacy Specialist

## 2013-08-30 DIAGNOSIS — Z7901 Long term (current) use of anticoagulants: Secondary | ICD-10-CM

## 2013-08-30 DIAGNOSIS — I4891 Unspecified atrial fibrillation: Secondary | ICD-10-CM

## 2013-08-30 LAB — POCT INR: INR: 2.2

## 2013-09-01 ENCOUNTER — Other Ambulatory Visit: Payer: Self-pay | Admitting: Pulmonary Disease

## 2013-09-15 ENCOUNTER — Other Ambulatory Visit: Payer: Self-pay | Admitting: Internal Medicine

## 2013-09-19 NOTE — Telephone Encounter (Signed)
Rx was sent to pharmacy electronically. 

## 2013-09-21 ENCOUNTER — Ambulatory Visit (INDEPENDENT_AMBULATORY_CARE_PROVIDER_SITE_OTHER): Payer: Commercial Managed Care - HMO | Admitting: Pharmacist Clinician (PhC)/ Clinical Pharmacy Specialist

## 2013-09-21 DIAGNOSIS — Z7901 Long term (current) use of anticoagulants: Secondary | ICD-10-CM

## 2013-09-21 DIAGNOSIS — I4891 Unspecified atrial fibrillation: Secondary | ICD-10-CM

## 2013-09-21 LAB — POCT INR: INR: 1.7

## 2013-09-26 ENCOUNTER — Ambulatory Visit: Payer: Commercial Managed Care - HMO | Admitting: Pharmacist Clinician (PhC)/ Clinical Pharmacy Specialist

## 2013-09-29 ENCOUNTER — Other Ambulatory Visit: Payer: Self-pay | Admitting: Internal Medicine

## 2013-09-30 NOTE — Telephone Encounter (Signed)
Rx was sent to pharmacy electronically. 

## 2013-10-07 ENCOUNTER — Ambulatory Visit (INDEPENDENT_AMBULATORY_CARE_PROVIDER_SITE_OTHER): Payer: Commercial Managed Care - HMO | Admitting: Pharmacist Clinician (PhC)/ Clinical Pharmacy Specialist

## 2013-10-07 DIAGNOSIS — Z7901 Long term (current) use of anticoagulants: Secondary | ICD-10-CM

## 2013-10-07 DIAGNOSIS — I4891 Unspecified atrial fibrillation: Secondary | ICD-10-CM

## 2013-10-07 LAB — POCT INR: INR: 2.5

## 2013-10-11 ENCOUNTER — Ambulatory Visit: Payer: Commercial Managed Care - HMO | Admitting: Internal Medicine

## 2013-10-18 ENCOUNTER — Ambulatory Visit (INDEPENDENT_AMBULATORY_CARE_PROVIDER_SITE_OTHER): Payer: Commercial Managed Care - HMO | Admitting: *Deleted

## 2013-10-18 ENCOUNTER — Encounter: Payer: Self-pay | Admitting: Internal Medicine

## 2013-10-18 ENCOUNTER — Ambulatory Visit (INDEPENDENT_AMBULATORY_CARE_PROVIDER_SITE_OTHER): Payer: Commercial Managed Care - HMO | Admitting: Internal Medicine

## 2013-10-18 VITALS — BP 122/66 | HR 61 | Ht 66.0 in | Wt 185.5 lb

## 2013-10-18 DIAGNOSIS — Z7901 Long term (current) use of anticoagulants: Secondary | ICD-10-CM

## 2013-10-18 DIAGNOSIS — I2510 Atherosclerotic heart disease of native coronary artery without angina pectoris: Secondary | ICD-10-CM

## 2013-10-18 DIAGNOSIS — I2583 Coronary atherosclerosis due to lipid rich plaque: Secondary | ICD-10-CM

## 2013-10-18 DIAGNOSIS — I4891 Unspecified atrial fibrillation: Secondary | ICD-10-CM

## 2013-10-18 DIAGNOSIS — I480 Paroxysmal atrial fibrillation: Secondary | ICD-10-CM

## 2013-10-18 DIAGNOSIS — I10 Essential (primary) hypertension: Secondary | ICD-10-CM

## 2013-10-18 DIAGNOSIS — E7800 Pure hypercholesterolemia, unspecified: Secondary | ICD-10-CM

## 2013-10-18 DIAGNOSIS — F411 Generalized anxiety disorder: Secondary | ICD-10-CM

## 2013-10-18 DIAGNOSIS — I059 Rheumatic mitral valve disease, unspecified: Secondary | ICD-10-CM

## 2013-10-18 LAB — POCT INR: INR: 2.8

## 2013-10-18 NOTE — Patient Instructions (Signed)
Your physician wants you to follow-up in: 1 year with Dr. Hilty. You will receive a reminder letter in the mail two months in advance. If you don't receive a letter, please call our office to schedule the follow-up appointment.  

## 2013-10-18 NOTE — Progress Notes (Signed)
OFFICE NOTE  Chief Complaint:  Routine follow-up  Primary Care Physician: Walker Kehr, MD  HPI:  [name]  is a 79 year old female with a history of paroxysmal atrial fibrillation and SVT in the past. This happened when she underwent a Myoview for chest pain in the hospital. She was placed on metoprolol succinate 25 b.i.d. and has done well with that with improvement in her palpitations. Her echo showed an EF greater than 55% with some mild aortic insufficiency and an aneurysmal atrial septum. She is on simvastatin as well as Coumadin managed by you. She's recently had some abnormal labs which showed borderline diabetes. She's not currently a medication for that. She reports occasional intermittent chest pain. She underwent a cardia catheterization in the past which showed mild to moderate nonobstructive coronary disease which is managed medically. She does also have bilateral venous insufficiency of the lower chin and knees and has worn compression stockings with improvement in her swelling and symptoms.  [name] returns today without any new complaints. She denies any shortness of breath or chest pain. She does get some occasional chest wall pain and is relieved with tramadol.  PMHx:  Past Medical History  Diagnosis Date  . Bronchitis, not specified as acute or chronic   . Mitral valve disorders   . Essential hypertension   . Unspecified venous (peripheral) insufficiency   . Phlebitis and thrombophlebitis of superficial vessels of lower extremities   . Dyslipidemia   . Other abnormal glucose   . Diaphragmatic hernia without mention of obstruction or gangrene   . Irritable bowel syndrome   . Osteoarthrosis, unspecified whether generalized or localized, unspecified site   . Osteoporosis, unspecified   . Unspecified vitamin D deficiency   . Anxiety state, unspecified   . History of palpitations   . CAD (coronary artery disease)   . PAF (paroxysmal atrial  fibrillation)   . History of nuclear stress test 07/2010    lexiscan - no perfusion defects     Past Surgical History  Procedure Laterality Date  . Vesicovaginal fistula closure w/ tah    . Cardiac catheterization  07/30/2010    nonobstructive CAD, 20-40% RCA stenosis, 50-60% eccentric LAD stenosis more prox, 40% LAD stenosis more distal, EF 65%  . Transthoracic echocardiogram  09/03/2010    EF=>55%; mild MR; mod TR; AV mildly sclerotic with trace regurg; mild pulm valve regurg    FAMHx:  Family History  Problem Relation Age of Onset  . Stroke Mother   . Stroke Father     also heart disease  . Stroke Brother     also heart disease  . Clotting disorder Paternal Grandmother     blood clot    SOCHx:   reports that she has never smoked. She has never used smokeless tobacco. She reports that she does not drink alcohol or use illicit drugs.  ALLERGIES:  Allergies  Allergen Reactions  . Clarithromycin     REACTION: unspecified  . Guaifenesin Er     itching  . Metformin     REACTION: pt states INTOL to Metformin "felt drained"  . Tape     Adhesive tape---rash    ROS: A comprehensive review of systems was negative.  HOME MEDS: Current Outpatient Prescriptions  Medication Sig Dispense Refill  . alendronate (FOSAMAX) 70 MG tablet take 1 tablet by mouth every week  4 tablet  11  . ALPRAZolam (XANAX) 0.5 MG tablet take 1/2-1 tablet by mouth three times a day  if needed  90 tablet  5  . alum & mag hydroxide-simeth (MAALOX ADVANCED MAX ST) 400-400-40 MG/5ML suspension Take 15 mLs by mouth as needed.        [name] amLODipine (NORVASC) 10 MG tablet take 1 tablet by mouth once daily  30 tablet  7  . aspirin 81 MG tablet Take 81 mg by mouth daily.        . diclofenac sodium (VOLTAREN) 1 % GEL Apply to left ankle four times per day per Dr. Samara Snide      . dicyclomine (BENTYL) 20 MG tablet take 1 tablet by mouth every 6 hours if needed for ABDOMINAL CRAMPS.  50 tablet  1  . fluocinonide cream  (LIDEX) 0.05 % Apply twice daily to rash as needed  30 g  5  . hydroxypropyl methylcellulose (ISOPTO TEARS) 2.5 % ophthalmic solution Place 1 drop into both eyes 2 (two) times daily.        . isosorbide mononitrate (IMDUR) 30 MG 24 hr tablet take 1 tablet by mouth once daily  30 tablet  3  . metoprolol succinate (TOPROL-XL) 25 MG 24 hr tablet take 1 tablet by mouth twice a day  60 tablet  6  . ONE TOUCH ULTRA TEST test strip TEST ONCE A DAY OR AS DIRECTED.  100 each  1  . pantoprazole (PROTONIX) 40 MG tablet take 1 tablet by mouth once daily  30 tablet  3  . simvastatin (ZOCOR) 20 MG tablet take 1 tablet by mouth at bedtime  30 tablet  11  . traMADol (ULTRAM) 50 MG tablet take 1 tablet by mouth three times a day for pain **NOT TO EXCEED 3 TABS DAILY**  90 tablet  4  . Vitamin D, Ergocalciferol, (DRISDOL) 50000 UNITS CAPS capsule take 1 capsule by mouth every week  4 capsule  1  . warfarin (COUMADIN) 5 MG tablet use as directed  30 tablet  5   No current facility-administered medications for this visit.    LABS/IMAGING: Results for orders placed in visit on 10/18/13 (from the past 48 hour(s))  POCT INR     Status: None   Collection Time    10/18/13  4:04 PM      Result Value Ref Range   INR 2.8     No results found.  VITALS: BP 122/66  Pulse 61  Ht 5\' 6"  (1.676 m)  Wt 185 lb 8 oz (84.142 kg)  BMI 29.95 kg/m2  EXAM: General appearance: alert and no distress Neck: no carotid bruit and no JVD Lungs: clear to auscultation bilaterally Heart: regular rate and rhythm, S1, S2 normal, no murmur, click, rub or gallop Abdomen: soft, non-tender; bowel sounds normal; no masses,  no organomegaly and overweight Extremities: extremities normal, atraumatic, no cyanosis or edema Pulses: 2+ and symmetric Skin: Skin color, texture, turgor normal. No rashes or lesions Neurologic: Grossly normal Psych: Mood, affect normal  EKG: Normal sinus rhythm at 61  ASSESSMENT: 1. Atrial fibrillation on  warfarin 2. Hypertension-controlled 3. Dyslipidemia-on statin 4. Overweight 5. Moderate, non-obstructive CAD by cath in 2012 6. PSVT - controlled by b-blocker and vagal maneuvers  PLAN: 1.   [name] is doing well overall with relatively infrequent occurrences of paroxysmal tachyarrhythmias. Her INR has been well controlled and is currently 2.8 today. She's had no further tachycardia arrhythmias of concern. She's not having any cardiac chest pain. Will plan to continue her current medications and see her back in 1 year.  Nadean Corwin  Debara Pickett, MD, [facility] Attending Cardiologist [place] 10/18/2013, 7:29 PM

## 2013-10-24 ENCOUNTER — Other Ambulatory Visit: Payer: Self-pay | Admitting: Pulmonary Disease

## 2013-11-03 ENCOUNTER — Other Ambulatory Visit: Payer: Self-pay | Admitting: Pulmonary Disease

## 2013-11-04 ENCOUNTER — Other Ambulatory Visit: Payer: Self-pay | Admitting: Internal Medicine

## 2013-11-04 NOTE — Telephone Encounter (Signed)
Please advise refill? 

## 2013-11-07 ENCOUNTER — Other Ambulatory Visit: Payer: Self-pay | Admitting: Internal Medicine

## 2013-11-07 ENCOUNTER — Other Ambulatory Visit: Payer: Self-pay | Admitting: Pharmacist Clinician (PhC)/ Clinical Pharmacy Specialist

## 2013-11-07 MED ORDER — WARFARIN SODIUM 5 MG PO TABS: ORAL_TABLET | ORAL | Status: DC

## 2013-11-10 ENCOUNTER — Other Ambulatory Visit: Payer: Self-pay | Admitting: Pulmonary Disease

## 2013-11-15 ENCOUNTER — Ambulatory Visit: Payer: Commercial Managed Care - HMO | Admitting: Pharmacist Clinician (PhC)/ Clinical Pharmacy Specialist

## 2013-11-16 ENCOUNTER — Ambulatory Visit (INDEPENDENT_AMBULATORY_CARE_PROVIDER_SITE_OTHER): Payer: Commercial Managed Care - HMO | Admitting: Pharmacist Clinician (PhC)/ Clinical Pharmacy Specialist

## 2013-11-16 DIAGNOSIS — Z7901 Long term (current) use of anticoagulants: Secondary | ICD-10-CM | POA: Diagnosis not present

## 2013-11-16 LAB — POCT INR: INR: 2.2

## 2013-12-01 ENCOUNTER — Telehealth: Payer: Self-pay | Admitting: Internal Medicine

## 2013-12-01 NOTE — Telephone Encounter (Signed)
Patient is requesting [facility] referral to Dr. Clent Jacks for eye exam.

## 2013-12-14 ENCOUNTER — Encounter: Payer: Self-pay | Admitting: Internal Medicine

## 2013-12-14 ENCOUNTER — Other Ambulatory Visit (HOSPITAL_COMMUNITY)
Admission: RE | Admit: 2013-12-14 | Discharge: 2013-12-14 | Disposition: A | Discharge status: Home / Self Care | Payer: Commercial Managed Care - HMO | Source: Ambulatory Visit | Attending: Internal Medicine | Admitting: Internal Medicine

## 2013-12-14 ENCOUNTER — Ambulatory Visit (INDEPENDENT_AMBULATORY_CARE_PROVIDER_SITE_OTHER): Payer: Commercial Managed Care - HMO | Admitting: Internal Medicine

## 2013-12-14 VITALS — BP 144/80 | HR 72 | Temp 98.3°F | Wt 184.0 lb

## 2013-12-14 DIAGNOSIS — M1990 Unspecified osteoarthritis, unspecified site: Secondary | ICD-10-CM

## 2013-12-14 DIAGNOSIS — R3129 Other microscopic hematuria: Secondary | ICD-10-CM | POA: Insufficient documentation

## 2013-12-14 DIAGNOSIS — I10 Essential (primary) hypertension: Secondary | ICD-10-CM

## 2013-12-14 DIAGNOSIS — E559 Vitamin D deficiency, unspecified: Secondary | ICD-10-CM

## 2013-12-14 DIAGNOSIS — R229 Localized swelling, mass and lump, unspecified: Secondary | ICD-10-CM

## 2013-12-14 DIAGNOSIS — R7309 Other abnormal glucose: Secondary | ICD-10-CM

## 2013-12-14 DIAGNOSIS — R2242 Localized swelling, mass and lump, left lower limb: Secondary | ICD-10-CM | POA: Insufficient documentation

## 2013-12-14 NOTE — Progress Notes (Deleted)
Pre visit review using our clinic review tool, if applicable. No additional management support is needed unless otherwise documented below in the visit note. 

## 2013-12-14 NOTE — Assessment & Plan Note (Addendum)
Dx: 8/15 prox L thigh medial aspect large mass - ?? sebaceous cyst 5x6x4 cm Surgical ref

## 2013-12-14 NOTE — Assessment & Plan Note (Signed)
Labs

## 2013-12-14 NOTE — Assessment & Plan Note (Signed)
Urol ref offered Repeat UA, urine cytology

## 2013-12-14 NOTE — Assessment & Plan Note (Signed)
Continue with current prescription therapy as reflected on the Med list.  

## 2013-12-14 NOTE — Progress Notes (Signed)
Patient ID: [name], female   DOB: 1935/03/26, 79 y.o.   MRN: [id number]   Subjective:   HPI  C/o L prox thigh lump is getting bigger  The patient presents for a follow-up of  chronic hypertension, chronic dyslipidemia, type 2 diabetes controlled with diet, CAD, GERD  Wt Readings from Last 3 Encounters:  12/14/13 184 lb (83.462 kg)  10/18/13 185 lb 8 oz (84.142 kg)  07/26/13 183 lb (83.008 kg)   BP Readings from Last 3 Encounters:  12/14/13 144/80  10/18/13 122/66  07/26/13 134/72      Review of Systems  Constitutional: Negative for chills, activity change, appetite change, fatigue and unexpected weight change.  HENT: Negative for congestion, mouth sores, sinus pressure and trouble swallowing.   Eyes: Negative for visual disturbance.  Respiratory: Negative for cough, chest tightness and shortness of breath.   Cardiovascular: Negative for chest pain.  Gastrointestinal: Negative for nausea and abdominal pain.  Genitourinary: Negative for frequency, difficulty urinating and vaginal pain.  Musculoskeletal: Negative for back pain and gait problem.  Skin: Negative for pallor and rash.  Neurological: Negative for dizziness, tremors, weakness, numbness and headaches.  Psychiatric/Behavioral: Negative for suicidal ideas, confusion and sleep disturbance.       Objective:   Physical Exam  Constitutional: She appears well-developed. No distress.  HENT:  Head: Normocephalic.  Right Ear: External ear normal.  Left Ear: External ear normal.  Nose: Nose normal.  Mouth/Throat: Oropharynx is clear and moist.  Eyes: Conjunctivae are normal. Pupils are equal, round, and reactive to light. Right eye exhibits no discharge. Left eye exhibits no discharge.  Neck: Normal range of motion. Neck supple. No JVD present. No tracheal deviation present. No thyromegaly present.  Cardiovascular: Normal rate, regular rhythm and normal heart sounds.   Pulmonary/Chest: No stridor. No respiratory  distress. She has no wheezes.  Abdominal: Soft. Bowel sounds are normal. She exhibits no distension and no mass. There is no tenderness. There is no rebound and no guarding.  Musculoskeletal: She exhibits no edema and no tenderness.  Lymphadenopathy:    She has no cervical adenopathy.  Neurological: She displays normal reflexes. No cranial nerve deficit. She exhibits normal muscle tone. Coordination normal.  Skin: No rash noted. No erythema.  Psychiatric: She has a normal mood and affect. Her behavior is normal. Judgment and thought content normal.    Lab Results  Component Value Date   WBC 4.8 08/04/2013   HGB 13.2 08/04/2013   HCT 39.8 08/04/2013   PLT 240.0 08/04/2013   GLUCOSE 124* 08/04/2013   CHOL 191 08/04/2013   TRIG 78.0 08/04/2013   HDL 79.20 08/04/2013   LDLDIRECT 105.5 11/26/2010   LDLCALC 96 08/04/2013   ALT 15 08/04/2013   AST 19 08/04/2013   NA 138 08/04/2013   K 4.2 08/04/2013   CL 104 08/04/2013   CREATININE 0.9 08/04/2013   BUN 14 08/04/2013   CO2 30 08/04/2013   TSH 1.04 08/04/2013   INR 2.2 11/16/2013   HGBA1C 6.7* 08/04/2013         Assessment & Plan:

## 2013-12-16 ENCOUNTER — Ambulatory Visit (INDEPENDENT_AMBULATORY_CARE_PROVIDER_SITE_OTHER): Payer: Commercial Managed Care - HMO | Admitting: Pharmacist Clinician (PhC)/ Clinical Pharmacy Specialist

## 2013-12-16 DIAGNOSIS — I4891 Unspecified atrial fibrillation: Secondary | ICD-10-CM

## 2013-12-16 DIAGNOSIS — Z7901 Long term (current) use of anticoagulants: Secondary | ICD-10-CM

## 2013-12-16 LAB — POCT INR: INR: 2.4

## 2013-12-19 ENCOUNTER — Encounter: Payer: Self-pay | Admitting: Internal Medicine

## 2013-12-21 ENCOUNTER — Other Ambulatory Visit: Payer: Self-pay | Admitting: Pulmonary Disease

## 2013-12-25 ENCOUNTER — Other Ambulatory Visit: Payer: Self-pay | Admitting: Internal Medicine

## 2013-12-25 DIAGNOSIS — R319 Hematuria, unspecified: Secondary | ICD-10-CM

## 2013-12-26 ENCOUNTER — Telehealth: Payer: Self-pay | Admitting: Internal Medicine

## 2013-12-26 MED ORDER — ALPRAZOLAM 0.5 MG PO TABS: ORAL_TABLET | ORAL | Status: DC

## 2013-12-26 MED ORDER — TRAMADOL HCL 50 MG PO TABS: ORAL_TABLET | ORAL | Status: DC

## 2013-12-26 NOTE — Telephone Encounter (Signed)
Printed both scripts [facility] to [facility]...[name]

## 2013-12-26 NOTE — Telephone Encounter (Signed)
Patient is requesting refills for alprazolam and tramadol to be sent to [facility] on [address].

## 2013-12-26 NOTE — Telephone Encounter (Signed)
OK to fill both prescriptions with additional refills x0 Thank you!  

## 2014-01-05 ENCOUNTER — Other Ambulatory Visit: Payer: Self-pay | Admitting: Internal Medicine

## 2014-01-06 ENCOUNTER — Telehealth: Payer: Self-pay | Admitting: Internal Medicine

## 2014-01-06 NOTE — Telephone Encounter (Signed)
Patient needs [facility] referral sent to Dr. Katy Fitch.  She states she has a 9:00am appointment this morning!  I told her I don't think that will happen.

## 2014-01-06 NOTE — Telephone Encounter (Signed)
Rx was sent to pharmacy electronically. 

## 2014-01-09 NOTE — Telephone Encounter (Signed)
Referral # [id number]  Start date 01/09/14 exp 07/08/14 good for 6 visits

## 2014-01-11 ENCOUNTER — Ambulatory Visit (INDEPENDENT_AMBULATORY_CARE_PROVIDER_SITE_OTHER): Payer: Commercial Managed Care - HMO | Admitting: Pharmacist Clinician (PhC)/ Clinical Pharmacy Specialist

## 2014-01-11 DIAGNOSIS — I4891 Unspecified atrial fibrillation: Secondary | ICD-10-CM

## 2014-01-11 DIAGNOSIS — Z7901 Long term (current) use of anticoagulants: Secondary | ICD-10-CM

## 2014-01-11 LAB — POCT INR: INR: 2.5

## 2014-01-15 ENCOUNTER — Other Ambulatory Visit: Payer: Self-pay | Admitting: Internal Medicine

## 2014-01-16 ENCOUNTER — Telehealth: Payer: Self-pay | Admitting: Internal Medicine

## 2014-01-16 NOTE — Telephone Encounter (Signed)
Rec'd from [facility] forward 1 page to Dr. Alain Marion

## 2014-01-16 NOTE — Telephone Encounter (Signed)
Rx was sent to pharmacy electronically. 

## 2014-01-19 ENCOUNTER — Other Ambulatory Visit: Payer: Self-pay | Admitting: Pulmonary Disease

## 2014-01-28 ENCOUNTER — Other Ambulatory Visit: Payer: Self-pay | Admitting: Pulmonary Disease

## 2014-01-29 ENCOUNTER — Other Ambulatory Visit: Payer: Self-pay | Admitting: Internal Medicine

## 2014-01-30 ENCOUNTER — Telehealth: Payer: Self-pay | Admitting: Internal Medicine

## 2014-01-30 MED ORDER — VITAMIN D (ERGOCALCIFEROL) 1.25 MG (50000 UNIT) PO CAPS: ORAL_CAPSULE | ORAL | Status: DC

## 2014-01-30 NOTE — Telephone Encounter (Signed)
Patient states pharmacy has been trying to contact office for a week on sending a script for Vit D over.  Patient uses [facility] on [address].

## 2014-01-30 NOTE — Telephone Encounter (Signed)
Rx was sent to pharmacy electronically. 

## 2014-01-30 NOTE — Telephone Encounter (Signed)
Notified pt pharmacy sent request to Dr. Lenna Gilford office, but will send refills back to [facility]...[name]

## 2014-01-31 ENCOUNTER — Ambulatory Visit: Payer: Commercial Managed Care - HMO | Admitting: Internal Medicine

## 2014-02-07 ENCOUNTER — Ambulatory Visit: Payer: Commercial Managed Care - HMO | Admitting: Internal Medicine

## 2014-02-10 ENCOUNTER — Other Ambulatory Visit: Payer: Self-pay | Admitting: Pulmonary Disease

## 2014-02-20 ENCOUNTER — Ambulatory Visit (INDEPENDENT_AMBULATORY_CARE_PROVIDER_SITE_OTHER): Payer: Commercial Managed Care - HMO | Admitting: Pharmacist Clinician (PhC)/ Clinical Pharmacy Specialist

## 2014-02-20 DIAGNOSIS — I4891 Unspecified atrial fibrillation: Secondary | ICD-10-CM

## 2014-02-20 DIAGNOSIS — Z7901 Long term (current) use of anticoagulants: Secondary | ICD-10-CM

## 2014-02-20 LAB — POCT INR: INR: 2.4

## 2014-03-02 ENCOUNTER — Other Ambulatory Visit: Payer: Self-pay | Admitting: Pulmonary Disease

## 2014-03-03 ENCOUNTER — Telehealth: Payer: Self-pay | Admitting: Internal Medicine

## 2014-03-03 NOTE — Telephone Encounter (Signed)
OK to fill this prescription with additional refills x1 Thank you!  

## 2014-03-03 NOTE — Telephone Encounter (Signed)
Pt calling for Xanex refill, 2 tabs left. [place]

## 2014-03-06 MED ORDER — ALPRAZOLAM 0.5 MG PO TABS: ORAL_TABLET | ORAL | Status: DC

## 2014-03-06 NOTE — Telephone Encounter (Signed)
Called pt no answer LMOM rx has been [facility] to [facility]...[name]

## 2014-03-16 ENCOUNTER — Other Ambulatory Visit: Payer: Self-pay | Admitting: Internal Medicine

## 2014-03-16 ENCOUNTER — Other Ambulatory Visit: Payer: Self-pay | Admitting: Pulmonary Disease

## 2014-03-17 ENCOUNTER — Telehealth: Payer: Self-pay | Admitting: Internal Medicine

## 2014-03-17 MED ORDER — CIPROFLOXACIN HCL 250 MG PO TABS: 250.0000 mg | ORAL_TABLET | Freq: Two times a day (BID) | ORAL | Status: DC

## 2014-03-17 NOTE — Telephone Encounter (Signed)
Pt called and states she has frequency and urgency (urination), no burning. Pt requesting medication IF possible.

## 2014-03-17 NOTE — Telephone Encounter (Signed)
Ok Cipro Thx 

## 2014-03-17 NOTE — Telephone Encounter (Signed)
Left detailed mess informing pt.  

## 2014-03-30 ENCOUNTER — Other Ambulatory Visit: Payer: Self-pay | Admitting: Pulmonary Disease

## 2014-03-31 ENCOUNTER — Ambulatory Visit (INDEPENDENT_AMBULATORY_CARE_PROVIDER_SITE_OTHER): Payer: Commercial Managed Care - HMO | Admitting: Pharmacist Clinician (PhC)/ Clinical Pharmacy Specialist

## 2014-03-31 DIAGNOSIS — I4891 Unspecified atrial fibrillation: Secondary | ICD-10-CM

## 2014-03-31 DIAGNOSIS — Z7901 Long term (current) use of anticoagulants: Secondary | ICD-10-CM

## 2014-03-31 LAB — POCT INR: INR: 2

## 2014-04-03 ENCOUNTER — Other Ambulatory Visit: Payer: Self-pay | Admitting: Geriatric Medicine

## 2014-04-03 MED ORDER — VITAMIN D (ERGOCALCIFEROL) 1.25 MG (50000 UNIT) PO CAPS: ORAL_CAPSULE | ORAL | Status: DC

## 2014-04-18 ENCOUNTER — Ambulatory Visit: Payer: Commercial Managed Care - HMO | Admitting: Internal Medicine

## 2014-04-23 ENCOUNTER — Other Ambulatory Visit: Payer: Self-pay | Admitting: Internal Medicine

## 2014-04-25 NOTE — Telephone Encounter (Signed)
Rx(s) sent to pharmacy electronically.  

## 2014-04-27 ENCOUNTER — Other Ambulatory Visit: Payer: Self-pay | Admitting: Pulmonary Disease

## 2014-05-01 ENCOUNTER — Encounter: Payer: Self-pay | Admitting: Internal Medicine

## 2014-05-01 ENCOUNTER — Ambulatory Visit (INDEPENDENT_AMBULATORY_CARE_PROVIDER_SITE_OTHER): Payer: Commercial Managed Care - HMO | Admitting: Internal Medicine

## 2014-05-01 VITALS — BP 116/70 | HR 57 | Temp 98.0°F | Wt 182.0 lb

## 2014-05-01 DIAGNOSIS — E119 Type 2 diabetes mellitus without complications: Secondary | ICD-10-CM

## 2014-05-01 DIAGNOSIS — M810 Age-related osteoporosis without current pathological fracture: Secondary | ICD-10-CM

## 2014-05-01 DIAGNOSIS — I482 Chronic atrial fibrillation, unspecified: Secondary | ICD-10-CM

## 2014-05-01 DIAGNOSIS — Z23 Encounter for immunization: Secondary | ICD-10-CM

## 2014-05-01 DIAGNOSIS — R197 Diarrhea, unspecified: Secondary | ICD-10-CM | POA: Insufficient documentation

## 2014-05-01 MED ORDER — LOPERAMIDE HCL 2 MG PO TABS: 2.0000 mg | ORAL_TABLET | Freq: Four times a day (QID) | ORAL | Status: DC | PRN

## 2014-05-01 MED ORDER — PROMETHAZINE HCL 12.5 MG RE SUPP: 12.5000 mg | Freq: Four times a day (QID) | RECTAL | Status: DC | PRN

## 2014-05-01 MED ORDER — TRAMADOL HCL 50 MG PO TABS: ORAL_TABLET | ORAL | Status: DC

## 2014-05-01 NOTE — Progress Notes (Signed)
Pre visit review using our clinic review tool, if applicable. No additional management support is needed unless otherwise documented below in the visit note. 

## 2014-05-01 NOTE — Assessment & Plan Note (Signed)
Labs Imodium prn

## 2014-05-01 NOTE — Assessment & Plan Note (Signed)
Diet controlled Labs

## 2014-05-01 NOTE — Patient Instructions (Signed)
Call if not better 

## 2014-05-01 NOTE — Assessment & Plan Note (Signed)
Hold Alendronate - upset stomach

## 2014-05-01 NOTE — Progress Notes (Signed)
Patient ID: [name], female   DOB: 1934/08/23, 80 y.o.   MRN: [id number]   Subjective:   HPI  C/o diarrhea and nausea x 2-3 d  The patient presents for a follow-up of  chronic hypertension, chronic dyslipidemia, type 2 diabetes controlled with diet, CAD, GERD  Wt Readings from Last 3 Encounters:  05/01/14 182 lb (82.555 kg)  12/14/13 184 lb (83.462 kg)  10/18/13 185 lb 8 oz (84.142 kg)   BP Readings from Last 3 Encounters:  05/01/14 116/70  12/14/13 144/80  10/18/13 122/66      Review of Systems  Constitutional: Negative for chills, activity change, appetite change, fatigue and unexpected weight change.  HENT: Negative for congestion, mouth sores, sinus pressure and trouble swallowing.   Eyes: Negative for visual disturbance.  Respiratory: Negative for cough, chest tightness and shortness of breath.   Cardiovascular: Negative for chest pain.  Gastrointestinal: Negative for nausea and abdominal pain.  Genitourinary: Negative for frequency, difficulty urinating and vaginal pain.  Musculoskeletal: Negative for back pain and gait problem.  Skin: Negative for pallor and rash.  Neurological: Negative for dizziness, tremors, weakness, numbness and headaches.  Psychiatric/Behavioral: Negative for suicidal ideas, confusion and sleep disturbance.       Objective:   Physical Exam  Constitutional: She appears well-developed. No distress.  HENT:  Head: Normocephalic.  Right Ear: External ear normal.  Left Ear: External ear normal.  Nose: Nose normal.  Mouth/Throat: Oropharynx is clear and moist.  Eyes: Conjunctivae are normal. Pupils are equal, round, and reactive to light. Right eye exhibits no discharge. Left eye exhibits no discharge.  Neck: Normal range of motion. Neck supple. No JVD present. No tracheal deviation present. No thyromegaly present.  Cardiovascular: Normal rate, regular rhythm and normal heart sounds.   Pulmonary/Chest: No stridor. No respiratory distress.  She has no wheezes.  Abdominal: Soft. Bowel sounds are normal. She exhibits no distension and no mass. There is no tenderness. There is no rebound and no guarding.  Musculoskeletal: She exhibits no edema or tenderness.  Lymphadenopathy:    She has no cervical adenopathy.  Neurological: She displays normal reflexes. No cranial nerve deficit. She exhibits normal muscle tone. Coordination normal.  Skin: No rash noted. No erythema.  Psychiatric: She has a normal mood and affect. Her behavior is normal. Judgment and thought content normal.    Lab Results  Component Value Date   WBC 4.8 08/04/2013   HGB 13.2 08/04/2013   HCT 39.8 08/04/2013   PLT 240.0 08/04/2013   GLUCOSE 124* 08/04/2013   CHOL 191 08/04/2013   TRIG 78.0 08/04/2013   HDL 79.20 08/04/2013   LDLDIRECT 105.5 11/26/2010   LDLCALC 96 08/04/2013   ALT 15 08/04/2013   AST 19 08/04/2013   NA 138 08/04/2013   K 4.2 08/04/2013   CL 104 08/04/2013   CREATININE 0.9 08/04/2013   BUN 14 08/04/2013   CO2 30 08/04/2013   TSH 1.04 08/04/2013   INR 2 03/31/2014   HGBA1C 6.7* 08/04/2013         Assessment & Plan:  Patient ID: [name], female   DOB: [birth year], 80 y.o.   MRN: [id number]

## 2014-05-01 NOTE — Addendum Note (Signed)
Addended by: Cresenciano Lick on: 05/01/2014 10:30 AM   Modules accepted: Orders

## 2014-05-01 NOTE — Assessment & Plan Note (Signed)
Continue with current prescription therapy as reflected on the Med list.  

## 2014-05-09 ENCOUNTER — Other Ambulatory Visit: Payer: Self-pay | Admitting: Pulmonary Disease

## 2014-05-12 ENCOUNTER — Ambulatory Visit (INDEPENDENT_AMBULATORY_CARE_PROVIDER_SITE_OTHER): Payer: Commercial Managed Care - HMO | Admitting: Pharmacist Clinician (PhC)/ Clinical Pharmacy Specialist

## 2014-05-12 DIAGNOSIS — Z7901 Long term (current) use of anticoagulants: Secondary | ICD-10-CM

## 2014-05-12 DIAGNOSIS — I4891 Unspecified atrial fibrillation: Secondary | ICD-10-CM

## 2014-05-12 LAB — POCT INR: INR: 2.3

## 2014-05-16 ENCOUNTER — Other Ambulatory Visit: Payer: Self-pay | Admitting: Internal Medicine

## 2014-06-06 ENCOUNTER — Other Ambulatory Visit: Payer: Self-pay

## 2014-06-08 MED ORDER — WARFARIN SODIUM 5 MG PO TABS: ORAL_TABLET | ORAL | Status: DC

## 2014-06-23 ENCOUNTER — Ambulatory Visit (INDEPENDENT_AMBULATORY_CARE_PROVIDER_SITE_OTHER): Payer: Commercial Managed Care - HMO | Admitting: Pharmacist Clinician (PhC)/ Clinical Pharmacy Specialist

## 2014-06-23 DIAGNOSIS — Z7901 Long term (current) use of anticoagulants: Secondary | ICD-10-CM | POA: Diagnosis not present

## 2014-06-23 DIAGNOSIS — I4891 Unspecified atrial fibrillation: Secondary | ICD-10-CM

## 2014-06-23 LAB — POCT INR: INR: 2.7

## 2014-07-06 ENCOUNTER — Other Ambulatory Visit: Payer: Self-pay | Admitting: Internal Medicine

## 2014-07-11 ENCOUNTER — Telehealth: Payer: Self-pay | Admitting: Internal Medicine

## 2014-07-11 MED ORDER — ALENDRONATE SODIUM 70 MG PO TABS: 70.0000 mg | ORAL_TABLET | ORAL | Status: DC

## 2014-07-11 NOTE — Telephone Encounter (Signed)
Patient states that her bones have been hurting in her legs. She asks to be placed back on to alendronate (FOSAMAX) 70 MG tablet [[id number]]  to see if it will help.

## 2014-07-11 NOTE — Telephone Encounter (Signed)
Ok to re-start Thx 

## 2014-07-12 NOTE — Telephone Encounter (Signed)
Called pt no answer LMOM with md response.../lmb 

## 2014-07-14 ENCOUNTER — Other Ambulatory Visit: Payer: Self-pay | Admitting: Internal Medicine

## 2014-07-14 DIAGNOSIS — Z1231 Encounter for screening mammogram for malignant neoplasm of breast: Secondary | ICD-10-CM

## 2014-07-27 ENCOUNTER — Ambulatory Visit (HOSPITAL_COMMUNITY)
Admission: RE | Admit: 2014-07-27 | Discharge: 2014-07-27 | Disposition: A | Discharge status: Home / Self Care | Payer: Commercial Managed Care - HMO | Source: Ambulatory Visit | Attending: Internal Medicine | Admitting: Internal Medicine

## 2014-07-27 DIAGNOSIS — Z1231 Encounter for screening mammogram for malignant neoplasm of breast: Secondary | ICD-10-CM | POA: Insufficient documentation

## 2014-08-04 ENCOUNTER — Ambulatory Visit (INDEPENDENT_AMBULATORY_CARE_PROVIDER_SITE_OTHER): Payer: Commercial Managed Care - HMO | Admitting: Pharmacist Clinician (PhC)/ Clinical Pharmacy Specialist

## 2014-08-04 DIAGNOSIS — I4891 Unspecified atrial fibrillation: Secondary | ICD-10-CM | POA: Diagnosis not present

## 2014-08-04 DIAGNOSIS — Z7901 Long term (current) use of anticoagulants: Secondary | ICD-10-CM

## 2014-08-04 LAB — POCT INR: INR: 2.2

## 2014-08-08 ENCOUNTER — Other Ambulatory Visit (INDEPENDENT_AMBULATORY_CARE_PROVIDER_SITE_OTHER): Payer: Commercial Managed Care - HMO

## 2014-08-08 ENCOUNTER — Ambulatory Visit (INDEPENDENT_AMBULATORY_CARE_PROVIDER_SITE_OTHER): Payer: Commercial Managed Care - HMO | Admitting: Internal Medicine

## 2014-08-08 ENCOUNTER — Encounter: Payer: Self-pay | Admitting: Internal Medicine

## 2014-08-08 VITALS — BP 118/78 | HR 64 | Temp 97.7°F | Resp 16 | Ht 66.0 in | Wt 185.0 lb

## 2014-08-08 DIAGNOSIS — K589 Irritable bowel syndrome without diarrhea: Secondary | ICD-10-CM | POA: Diagnosis not present

## 2014-08-08 DIAGNOSIS — R7309 Other abnormal glucose: Secondary | ICD-10-CM | POA: Diagnosis not present

## 2014-08-08 DIAGNOSIS — E119 Type 2 diabetes mellitus without complications: Secondary | ICD-10-CM

## 2014-08-08 DIAGNOSIS — I10 Essential (primary) hypertension: Secondary | ICD-10-CM

## 2014-08-08 DIAGNOSIS — R3129 Other microscopic hematuria: Secondary | ICD-10-CM

## 2014-08-08 DIAGNOSIS — R202 Paresthesia of skin: Secondary | ICD-10-CM | POA: Diagnosis not present

## 2014-08-08 LAB — CBC WITH DIFFERENTIAL/PLATELET
BASOS ABS: 0 10*3/uL (ref 0.0–0.1)
BASOS PCT: 0.3 % (ref 0.0–3.0)
EOS PCT: 0.7 % (ref 0.0–5.0)
Eosinophils Absolute: 0 10*3/uL (ref 0.0–0.7)
HCT: 37.9 % (ref 36.0–46.0)
Hemoglobin: 12.8 g/dL (ref 12.0–15.0)
LYMPHS PCT: 22.7 % (ref 12.0–46.0)
Lymphs Abs: 1.4 10*3/uL (ref 0.7–4.0)
MCHC: 33.6 g/dL (ref 30.0–36.0)
MCV: 86.7 fl (ref 78.0–100.0)
MONOS PCT: 5.5 % (ref 3.0–12.0)
Monocytes Absolute: 0.3 10*3/uL (ref 0.1–1.0)
NEUTROS ABS: 4.4 10*3/uL (ref 1.4–7.7)
Neutrophils Relative %: 70.8 % (ref 43.0–77.0)
Platelets: 248 10*3/uL (ref 150.0–400.0)
RBC: 4.37 Mil/uL (ref 3.87–5.11)
RDW: 14.3 % (ref 11.5–15.5)
WBC: 6.2 10*3/uL (ref 4.0–10.5)

## 2014-08-08 LAB — URINALYSIS, ROUTINE W REFLEX MICROSCOPIC
Bilirubin Urine: NEGATIVE
KETONES UR: NEGATIVE
Leukocytes, UA: NEGATIVE
Nitrite: NEGATIVE
Specific Gravity, Urine: <=1.005 — AB (ref 1.000–1.030)
Total Protein, Urine: NEGATIVE
Urine Glucose: NEGATIVE
Urobilinogen, UA: 0.2 (ref 0.0–1.0)
WBC UA: NONE SEEN (ref 0–?)
pH: 7 (ref 5.0–8.0)

## 2014-08-08 LAB — HEPATIC FUNCTION PANEL
ALK PHOS: 60 U/L (ref 39–117)
ALT: 13 U/L (ref 0–35)
AST: 18 U/L (ref 0–37)
Albumin: 4.4 g/dL (ref 3.5–5.2)
Bilirubin, Direct: 0.1 mg/dL (ref 0.0–0.3)
Total Bilirubin: 0.4 mg/dL (ref 0.2–1.2)
Total Protein: 7.3 g/dL (ref 6.0–8.3)

## 2014-08-08 LAB — BASIC METABOLIC PANEL
BUN: 13 mg/dL (ref 6–23)
CO2: 31 mEq/L (ref 19–32)
Calcium: 9.4 mg/dL (ref 8.4–10.5)
Chloride: 102 mEq/L (ref 96–112)
Creatinine, Ser: 0.97 mg/dL (ref 0.40–1.20)
GFR: 71.46 mL/min (ref >60.00)
Glucose, Bld: 108 mg/dL — ABNORMAL HIGH (ref 70–99)
Potassium: 4.1 mEq/L (ref 3.5–5.1)
Sodium: 136 mEq/L (ref 135–145)

## 2014-08-08 LAB — HEMOGLOBIN A1C: Hgb A1c MFr Bld: 6.9 % — ABNORMAL HIGH (ref 4.6–6.5)

## 2014-08-08 LAB — VITAMIN B12: Vitamin B-12: 462 pg/mL (ref 211–911)

## 2014-08-08 MED ORDER — ALPRAZOLAM 0.5 MG PO TABS: ORAL_TABLET | ORAL | Status: DC

## 2014-08-08 MED ORDER — CILIDINIUM-CHLORDIAZEPOXIDE 2.5-5 MG PO CAPS: 1.0000 | ORAL_CAPSULE | Freq: Three times a day (TID) | ORAL | Status: DC | PRN

## 2014-08-08 NOTE — Assessment & Plan Note (Signed)
IBS-D Fossamax (alendronate), Zocor (simvastatin) could give cramps - hold Rx Librax prn  Potential benefits of a long term prn benzodiazepines  use as well as potential risks  and complications were explained to the patient and were aknowledged.

## 2014-08-08 NOTE — Progress Notes (Signed)
Patient ID: [name], female   DOB: 01/19/1935, 80 y.o.   MRN: [id number]   Subjective:   HPI  C/o chronic stomach cramps at times  The patient presents for a follow-up of  chronic hypertension, chronic dyslipidemia, type 2 diabetes controlled with diet, CAD, GERD  Wt Readings from Last 3 Encounters:  08/08/14 185 lb (83.915 kg)  05/01/14 182 lb (82.555 kg)  12/14/13 184 lb (83.462 kg)   BP Readings from Last 3 Encounters:  08/08/14 118/78  05/01/14 116/70  12/14/13 144/80      Review of Systems  Constitutional: Negative for chills, activity change, appetite change, fatigue and unexpected weight change.  HENT: Negative for congestion, mouth sores, sinus pressure and trouble swallowing.   Eyes: Negative for visual disturbance.  Respiratory: Negative for cough, chest tightness and shortness of breath.   Cardiovascular: Negative for chest pain.  Gastrointestinal: Negative for nausea and abdominal pain.  Genitourinary: Negative for frequency, difficulty urinating and vaginal pain.  Musculoskeletal: Negative for back pain and gait problem.  Skin: Negative for pallor and rash.  Neurological: Negative for dizziness, tremors, weakness, numbness and headaches.  Psychiatric/Behavioral: Negative for suicidal ideas, confusion and sleep disturbance.       Objective:   Physical Exam  Constitutional: She appears well-developed. No distress.  HENT:  Head: Normocephalic.  Right Ear: External ear normal.  Left Ear: External ear normal.  Nose: Nose normal.  Mouth/Throat: Oropharynx is clear and moist.  Eyes: Conjunctivae are normal. Pupils are equal, round, and reactive to light. Right eye exhibits no discharge. Left eye exhibits no discharge.  Neck: Normal range of motion. Neck supple. No JVD present. No tracheal deviation present. No thyromegaly present.  Cardiovascular: Normal rate, regular rhythm and normal heart sounds.   Pulmonary/Chest: No stridor. No respiratory  distress. She has no wheezes.  Abdominal: Soft. Bowel sounds are normal. She exhibits no distension and no mass. There is no tenderness. There is no rebound and no guarding.  Musculoskeletal: She exhibits no edema or tenderness.  Lymphadenopathy:    She has no cervical adenopathy.  Neurological: She displays normal reflexes. No cranial nerve deficit. She exhibits normal muscle tone. Coordination normal.  Skin: No rash noted. No erythema.  Psychiatric: She has a normal mood and affect. Her behavior is normal. Judgment and thought content normal.    Lab Results  Component Value Date   WBC 4.8 08/04/2013   HGB 13.2 08/04/2013   HCT 39.8 08/04/2013   PLT 240.0 08/04/2013   GLUCOSE 124* 08/04/2013   CHOL 191 08/04/2013   TRIG 78.0 08/04/2013   HDL 79.20 08/04/2013   LDLDIRECT 105.5 11/26/2010   LDLCALC 96 08/04/2013   ALT 15 08/04/2013   AST 19 08/04/2013   NA 138 08/04/2013   K 4.2 08/04/2013   CL 104 08/04/2013   CREATININE 0.9 08/04/2013   BUN 14 08/04/2013   CO2 30 08/04/2013   TSH 1.04 08/04/2013   INR 2.2 08/04/2014   HGBA1C 6.7* 08/04/2013         Assessment & Plan:

## 2014-08-08 NOTE — Assessment & Plan Note (Signed)
  On diet  

## 2014-08-08 NOTE — Patient Instructions (Addendum)
Fossamax (alendronate), Zocor (simvastatin) could give you stomach cramps

## 2014-08-08 NOTE — Assessment & Plan Note (Signed)
Chronic Metoprolol

## 2014-08-10 DIAGNOSIS — D1720 Benign lipomatous neoplasm of skin and subcutaneous tissue of unspecified limb: Secondary | ICD-10-CM | POA: Diagnosis not present

## 2014-08-31 ENCOUNTER — Other Ambulatory Visit: Payer: Self-pay | Admitting: Internal Medicine

## 2014-09-15 ENCOUNTER — Ambulatory Visit (INDEPENDENT_AMBULATORY_CARE_PROVIDER_SITE_OTHER): Payer: Commercial Managed Care - HMO | Admitting: Pharmacist Clinician (PhC)/ Clinical Pharmacy Specialist

## 2014-09-15 DIAGNOSIS — I4891 Unspecified atrial fibrillation: Secondary | ICD-10-CM | POA: Diagnosis not present

## 2014-09-15 DIAGNOSIS — Z7901 Long term (current) use of anticoagulants: Secondary | ICD-10-CM | POA: Diagnosis not present

## 2014-09-15 LAB — POCT INR: INR: 2.3

## 2014-09-20 ENCOUNTER — Other Ambulatory Visit: Payer: Self-pay | Admitting: Internal Medicine

## 2014-10-10 ENCOUNTER — Telehealth: Payer: Self-pay | Admitting: Internal Medicine

## 2014-10-10 NOTE — Telephone Encounter (Signed)
Please advise, thanks.

## 2014-10-10 NOTE — Telephone Encounter (Signed)
Patient has a rash on her back and is itching pretty bad, she would like to know if you would call her in some Hydroxyzine to her pharmacy [facility] [address]

## 2014-10-11 MED ORDER — HYDROXYZINE HCL 25 MG PO TABS: 25.0000 mg | ORAL_TABLET | Freq: Three times a day (TID) | ORAL | Status: DC | PRN

## 2014-10-11 NOTE — Telephone Encounter (Signed)
Done. Thx.

## 2014-10-27 ENCOUNTER — Ambulatory Visit: Payer: Commercial Managed Care - HMO | Admitting: Pharmacist Clinician (PhC)/ Clinical Pharmacy Specialist

## 2014-10-28 ENCOUNTER — Other Ambulatory Visit: Payer: Self-pay | Admitting: Internal Medicine

## 2014-10-30 ENCOUNTER — Ambulatory Visit (INDEPENDENT_AMBULATORY_CARE_PROVIDER_SITE_OTHER): Payer: Commercial Managed Care - HMO | Admitting: Pharmacist Clinician (PhC)/ Clinical Pharmacy Specialist

## 2014-10-30 DIAGNOSIS — Z7901 Long term (current) use of anticoagulants: Secondary | ICD-10-CM | POA: Diagnosis not present

## 2014-10-30 DIAGNOSIS — I4891 Unspecified atrial fibrillation: Secondary | ICD-10-CM

## 2014-10-30 LAB — POCT INR: INR: 2.5

## 2014-10-30 NOTE — Telephone Encounter (Signed)
Rx(s) sent to pharmacy electronically.  

## 2014-11-07 ENCOUNTER — Telehealth: Payer: Self-pay | Admitting: Internal Medicine

## 2014-11-08 ENCOUNTER — Ambulatory Visit (INDEPENDENT_AMBULATORY_CARE_PROVIDER_SITE_OTHER): Payer: Commercial Managed Care - HMO | Admitting: Internal Medicine

## 2014-11-08 ENCOUNTER — Encounter: Payer: Self-pay | Admitting: Internal Medicine

## 2014-11-08 VITALS — BP 136/70 | HR 53 | Temp 98.3°F | Wt 188.0 lb

## 2014-11-08 DIAGNOSIS — R609 Edema, unspecified: Secondary | ICD-10-CM

## 2014-11-08 DIAGNOSIS — I872 Venous insufficiency (chronic) (peripheral): Secondary | ICD-10-CM | POA: Diagnosis not present

## 2014-11-08 MED ORDER — AMLODIPINE BESYLATE 10 MG PO TABS: 5.0000 mg | ORAL_TABLET | Freq: Every day | ORAL | Status: DC

## 2014-11-08 MED ORDER — FUROSEMIDE 20 MG PO TABS: 20.0000 mg | ORAL_TABLET | Freq: Every day | ORAL | Status: DC | PRN

## 2014-11-08 NOTE — Patient Instructions (Addendum)
Elevate legs/feet  Use support hose Loose weight Reduce Amlodipine 5 mg/d     Venous Stasis or Chronic Venous Insufficiency Chronic venous insufficiency, also called venous stasis, is a condition that affects the veins in the legs. The condition prevents blood from being pumped through these veins effectively. Blood may no longer be pumped effectively from the legs back to the heart. This condition can range from mild to severe. With proper treatment, you should be able to continue with an active life. CAUSES  Chronic venous insufficiency occurs when the vein walls become stretched, weakened, or damaged or when valves within the vein are damaged. Some common causes of this include:  High blood pressure inside the veins (venous hypertension).  Increased blood pressure in the leg veins from long periods of sitting or standing.  A blood clot that blocks blood flow in a vein (deep vein thrombosis).  Inflammation of a superficial vein (phlebitis) that causes a blood clot to form. RISK FACTORS Various things can make you more likely to develop chronic venous insufficiency, including:  Family history of this condition.  Obesity.  Pregnancy.  Sedentary lifestyle.  Smoking.  Jobs requiring long periods of standing or sitting in one place.  Being a certain age. Women in their 1s and 47s and men in their 98s are more likely to develop this condition. SIGNS AND SYMPTOMS  Symptoms may include:   Varicose veins.  Skin breakdown or ulcers.  Reddened or discolored skin on the leg.  Brown, smooth, tight, and painful skin just above the ankle, usually on the inside surface (lipodermatosclerosis).  Swelling. DIAGNOSIS  To diagnose this condition, your health care provider will take a medical history and do a physical exam. The following tests may be ordered to confirm the diagnosis:  Duplex ultrasound--A procedure that produces a picture of a blood vessel and nearby organs and also  provides information on blood flow through the blood vessel.  Plethysmography--A procedure that tests blood flow.  A venogram, or venography--A procedure used to look at the veins using X-ray and dye. TREATMENT The goals of treatment are to help you return to an active life and to minimize pain or disability. Treatment will depend on the severity of the condition. Medical procedures may be needed for severe cases. Treatment options may include:   Use of compression stockings. These can help with symptoms and lower the chances of the problem getting worse, but they do not cure the problem.  Sclerotherapy--A procedure involving an injection of a material that "dissolves" the damaged veins. Other veins in the network of blood vessels take over the function of the damaged veins.  Surgery to remove the vein or cut off blood flow through the vein (vein stripping or laser ablation surgery).  Surgery to repair a valve. HOME CARE INSTRUCTIONS   Wear compression stockings as directed by your health care provider.  Only take over-the-counter or prescription medicines for pain, discomfort, or fever as directed by your health care provider.  Follow up with your health care provider as directed. SEEK MEDICAL CARE IF:   You have redness, swelling, or increasing pain in the affected area.  You see a red streak or line that extends up or down from the affected area.  You have a breakdown or loss of skin in the affected area, even if the breakdown is small.  You have an injury to the affected area. SEEK IMMEDIATE MEDICAL CARE IF:   You have an injury and open wound in the  affected area.  Your pain is severe and does not improve with medicine.  You have sudden numbness or weakness in the foot or ankle below the affected area, or you have trouble moving your foot or ankle.  You have a fever or persistent symptoms for more than 2-3 days.  You have a fever and your symptoms suddenly get  worse. MAKE SURE YOU:   Understand these instructions.  Will watch your condition.  Will get help right away if you are not doing well or get worse. Document Released: 08/04/2006 Document Revised: 01/19/2013 Document Reviewed: 12/06/2012 [facility] Patient Information 2015 [place], [place]. This information is not intended to replace advice given to you by your health care provider. Make sure you discuss any questions you have with your health care provider.

## 2014-11-08 NOTE — Progress Notes (Signed)
Subjective:  Patient ID: [name], female    DOB: [birth year]  Age: 80 y.o. MRN: [id number]  CC: Foot Swelling   HPI [name] presents for B feet swelling x 2 days. Has had swelling before - when traveled to the beach  Outpatient Prescriptions Prior to Visit  Medication Sig Dispense Refill  . alendronate (FOSAMAX) 70 MG tablet Take 1 tablet (70 mg total) by mouth once a week. Take with a full glass of water on an empty stomach. 4 tablet 11  . ALPRAZolam (XANAX) 0.5 MG tablet take 1/2-1 tablet by mouth three times a day if needed 90 tablet 1  . alum & mag hydroxide-simeth (MAALOX ADVANCED MAX ST) 400-400-40 MG/5ML suspension Take 15 mLs by mouth as needed.      [name] aspirin 81 MG tablet Take 81 mg by mouth daily.      . clidinium-chlordiazePOXIDE (LIBRAX) 5-2.5 MG per capsule Take 1 capsule by mouth 3 (three) times daily as needed (for cramps). 60 capsule 2  . diclofenac sodium (VOLTAREN) 1 % GEL Apply to left ankle four times per day per Dr. Samara Snide    . fluocinonide cream (LIDEX) 0.05 % Apply twice daily to rash as needed 30 g 5  . hydroxypropyl methylcellulose (ISOPTO TEARS) 2.5 % ophthalmic solution Place 1 drop into both eyes 2 (two) times daily.      . hydrOXYzine (ATARAX/VISTARIL) 25 MG tablet Take 1 tablet (25 mg total) by mouth every 8 (eight) hours as needed for itching. 60 tablet 0  . isosorbide mononitrate (IMDUR) 30 MG 24 hr tablet take 1 tablet by mouth once daily 30 tablet 9  . loperamide (IMODIUM A-D) 2 MG tablet Take 1 tablet (2 mg total) by mouth 4 (four) times daily as needed for diarrhea or loose stools. 30 tablet 0  . metoprolol succinate (TOPROL-XL) 25 MG 24 hr tablet take 1 tablet by mouth twice a day 60 tablet 6  . ONE TOUCH ULTRA TEST test strip TEST ONCE A DAY OR AS DIRECTED. 100 each 1  . pantoprazole (PROTONIX) 40 MG tablet take 1 tablet by mouth once daily 30 tablet 10  . promethazine (PHENERGAN) 12.5 MG suppository Place 1 suppository (12.5 mg total)  rectally every 6 (six) hours as needed for nausea or vomiting. 30 each 0  . simvastatin (ZOCOR) 20 MG tablet take 1 tablet by mouth at bedtime 90 tablet 3  . traMADol (ULTRAM) 50 MG tablet take 1 tablet by mouth three times a day for pain **NOT TO EXCEED 3 TABS DAILY** 90 tablet 1  . Vitamin D, Ergocalciferol, (DRISDOL) 50000 UNITS CAPS capsule take 1 capsule by mouth every week 4 capsule 1  . warfarin (COUMADIN) 5 MG tablet take 1 tablet by mouth once daily OR AS DIRCETED BY COUMADIN CLINIC 30 tablet 3  . amLODipine (NORVASC) 10 MG tablet take 1 tablet by mouth once daily 30 tablet 9   No facility-administered medications prior to visit.    ROS Review of Systems  Constitutional: Negative for chills, activity change, appetite change, fatigue and unexpected weight change.  HENT: Negative for congestion, mouth sores and sinus pressure.   Eyes: Negative for visual disturbance.  Respiratory: Negative for cough, chest tightness and wheezing.   Cardiovascular: Positive for leg swelling.  Gastrointestinal: Negative for nausea and abdominal pain.  Genitourinary: Negative for frequency, difficulty urinating and vaginal pain.  Musculoskeletal: Negative for back pain and gait problem.  Skin: Negative for pallor and rash.  Neurological:  Negative for dizziness, tremors, weakness, numbness and headaches.  Psychiatric/Behavioral: Negative for confusion and sleep disturbance.    Objective:  BP 136/70 mmHg  Pulse 53  Temp(Src) 98.3 F (36.8 C) (Oral)  Wt 188 lb (85.276 kg)  SpO2 97%  BP Readings from Last 3 Encounters:  11/08/14 136/70  08/08/14 118/78  05/01/14 116/70    Wt Readings from Last 3 Encounters:  11/08/14 188 lb (85.276 kg)  08/08/14 185 lb (83.915 kg)  05/01/14 182 lb (82.555 kg)    Physical Exam  Constitutional: She appears well-developed. No distress.  HENT:  Head: Normocephalic.  Right Ear: External ear normal.  Left Ear: External ear normal.  Nose: Nose normal.    Mouth/Throat: Oropharynx is clear and moist.  Eyes: Conjunctivae are normal. Pupils are equal, round, and reactive to light. Right eye exhibits no discharge. Left eye exhibits no discharge.  Neck: Normal range of motion. Neck supple. No JVD present. No tracheal deviation present. No thyromegaly present.  Cardiovascular: Normal rate, regular rhythm and normal heart sounds.   Pulmonary/Chest: No stridor. No respiratory distress. She has no wheezes. She has no rales.  Abdominal: Soft. Bowel sounds are normal. She exhibits no distension and no mass. There is no tenderness. There is no rebound and no guarding.  Musculoskeletal: She exhibits edema. She exhibits no tenderness.  Lymphadenopathy:    She has no cervical adenopathy.  Neurological: She displays normal reflexes. No cranial nerve deficit. She exhibits normal muscle tone. Coordination normal.  Skin: No rash noted. No erythema.  Psychiatric: She has a normal mood and affect. Her behavior is normal. Judgment and thought content normal.  trace edema B ankles,hyperpigmented ankles  Lab Results  Component Value Date   WBC 6.2 08/08/2014   HGB 12.8 08/08/2014   HCT 37.9 08/08/2014   PLT 248.0 08/08/2014   GLUCOSE 108* 08/08/2014   CHOL 191 08/04/2013   TRIG 78.0 08/04/2013   HDL 79.20 08/04/2013   LDLDIRECT 105.5 11/26/2010   LDLCALC 96 08/04/2013   ALT 13 08/08/2014   AST 18 08/08/2014   NA 136 08/08/2014   K 4.1 08/08/2014   CL 102 08/08/2014   CREATININE 0.97 08/08/2014   BUN 13 08/08/2014   CO2 31 08/08/2014   TSH 1.04 08/04/2013   INR 2.5 10/30/2014   HGBA1C 6.9* 08/08/2014    Mm Digital Screening Bilateral  07/27/2014   CLINICAL DATA:  Screening.  EXAM: DIGITAL SCREENING BILATERAL MAMMOGRAM WITH CAD  COMPARISON:  Previous exam(s).  ACR Breast Density Category c: The breast tissue is heterogeneously dense, which may obscure small masses.  FINDINGS: There are no findings suspicious for malignancy. Images were processed with  CAD.  IMPRESSION: No mammographic evidence of malignancy. A result letter of this screening mammogram will be mailed directly to the patient.  RECOMMENDATION: Screening mammogram in one year. (Code:[id number]-B-01Y)  BI-RADS CATEGORY  1: Negative.   Electronically Signed   By: Andres Shad   On: 07/27/2014 16:21    Assessment & Plan:   Shamecka was seen today for foot swelling.  Diagnoses and all orders for this visit:  Edema  Chronic venous insufficiency  Other orders -     furosemide (LASIX) 20 MG tablet; Take 1 tablet (20 mg total) by mouth daily as needed for edema. -     amLODipine (NORVASC) 10 MG tablet; Take 0.5 tablets (5 mg total) by mouth daily.  I have changed Ms. Luff's amLODipine. I am also having her start on furosemide. Additionally, I  am having her maintain her aspirin, alum & mag hydroxide-simeth, hydroxypropyl methylcellulose / hypromellose, ONE TOUCH ULTRA TEST, diclofenac sodium, fluocinonide cream, pantoprazole, simvastatin, metoprolol succinate, traMADol, loperamide, promethazine, alendronate, clidinium-chlordiazePOXIDE, ALPRAZolam, warfarin, hydrOXYzine, isosorbide mononitrate, and Vitamin D (Ergocalciferol).  Meds ordered this encounter  Medications  . furosemide (LASIX) 20 MG tablet    Sig: Take 1 tablet (20 mg total) by mouth daily as needed for edema.    Dispense:  30 tablet    Refill:  3  . amLODipine (NORVASC) 10 MG tablet    Sig: Take 0.5 tablets (5 mg total) by mouth daily.    Dispense:  30 tablet    Refill:  11     Follow-up: Return in about 3 months (around 02/08/2015) for a follow-up visit.  Walker Kehr, MD

## 2014-11-08 NOTE — Assessment & Plan Note (Signed)
Elevate legs/feet  Use support hose Loose weight

## 2014-11-08 NOTE — Assessment & Plan Note (Addendum)
B LE - chronic venous insufficiency Elevate legs/feet  Use support hose Loose weight  Lasix prn - rare

## 2014-11-08 NOTE — Progress Notes (Signed)
Pre visit review using our clinic review tool, if applicable. No additional management support is needed unless otherwise documented below in the visit note. 

## 2014-11-09 ENCOUNTER — Telehealth: Payer: Self-pay | Admitting: Internal Medicine

## 2014-11-09 NOTE — Telephone Encounter (Signed)
I called pt- I answered her questions. She voiced understanding.

## 2014-11-09 NOTE — Telephone Encounter (Signed)
Pt call stated she was in the office yesterday to see Plot. Pt stated Dr. Camila Li told her to start taking medicine half daily (she does not remember the name of it, it might be amLODipine (NORVASC) 10 MG tablet; Take 0.5 tablets (5 mg total) by mouth daily). Pt stated she has a question about the direction for this med and she need to speak to the assistant about this. Please give her a call.

## 2014-11-13 NOTE — Telephone Encounter (Signed)
PLEASE NOTE: All timestamps contained within this report are represented as [place] Standard Time. CONFIDENTIALTY NOTICE: This fax transmission is intended only for the addressee. It contains information that is legally privileged, confidential or otherwise protected from use or disclosure. If you are not the intended recipient, you are strictly prohibited from reviewing, disclosing, copying using or disseminating any of this information or taking any action in reliance on or regarding this information. If you have received this fax in error, please notify us immediately by telephone so that we can arrange for its return to [place]. Phone: [phone], Toll-Free: [phone], Fax: [phone] Page: 1 of 2 Call Id: [id number] [facility] - Client [place] Patient Name: Rekita Wiggins Gender: Female DOB: 1935-01-25 Age: 80 Y 1 M 4 D Return Phone Number: [phone] (Primary) Address: City/State/Zip: [place] Client [facility] - Client Client Site [place] - Day Physician Plotnikov, Alex Contact Type Call Call Type Triage / Clinical Relationship To Patient Self Appointment Disposition EMR Appointment Scheduled Info pasted into Epic No Return Phone Number [phone] (Primary) Chief Complaint Heart palpitations or irregular heartbeat Initial Comment caller states she has fluttering in her chest, feet and ankles are swollen PreDisposition Call Doctor Nurse Assessment Nurse: Donalynn Furlong, RN, Myna Hidalgo Date/Time Eilene Ghazi Time): 11/07/2014 2:27:04 PM Confirm and document reason for call. If symptomatic, describe symptoms. ---caller states she has fluttering in her chest, feet and ankles are swollen. The fluttering occurred last week, not having it today, only swollen feet and ankles Has the patient traveled out of the country within the last 30 days? ---No Does the patient require triage?  ---Yes Related visit to physician within the last 2 weeks? ---No Does the PT have any chronic conditions? (i.e. diabetes, asthma, etc.) ---Yes List chronic conditions. ---HBP, High cholesteral Guidelines Guideline Title Affirmed Question Affirmed Notes Nurse Date/Time Eilene Ghazi Time) Leg Swelling and Edema [1] MILD swelling of both ankles (i.e., pedal edema) AND [2] new onset or worsening Birder Robson, Myna Hidalgo 11/07/2014 2:29:12 PM Disp. Time Eilene Ghazi Time) Disposition Final User 11/07/2014 2:23:30 PM Send To Clinical Follow Up Queue [facility] 11/07/2014 2:31:34 PM See PCP When Office is Open (within 3 days) Yes Donalynn Furlong, RN, Myna Hidalgo Caller Understands: Yes PLEASE NOTE: All timestamps contained within this report are represented as [place] Standard Time. CONFIDENTIALTY NOTICE: This fax transmission is intended only for the addressee. It contains information that is legally privileged, confidential or otherwise protected from use or disclosure. If you are not the intended recipient, you are strictly prohibited from reviewing, disclosing, copying using or disseminating any of this information or taking any action in reliance on or regarding this information. If you have received this fax in error, please notify us immediately by telephone so that we can arrange for its return to [place]. Phone: [phone], Toll-Free: [phone], Fax: [phone] Page: 2 of 2 Call Id: [id number] Disagree/Comply: Comply Care Advice Given Per Guideline SEE PCP WITHIN 3 DAYS: You need to be examined within 2 or 3 days. Call your doctor during regular office hours and make an appointment. (Note: if office will be open tomorrow, tell caller to call then, not in 3 days). LEG SWELLING AND/OR EDEMA: * Elevate your legs or try to lay down once or twice daily for 20 minutes. * Avoid socks with an elastic band at the top. Wear comfortable shoes. CALL BACK IF: * Swelling becomes worse * Swelling becomes red or  painful to  the touch * Calf pain occurs and becomes constant * You become worse. CARE ADVICE given per Leg Swelling and Edema (Adult) guideline. After Care Instructions Given Call Event Type User Date / Time Description Comments User: Gennie Alma, RN Date/Time Eilene Ghazi Time): 11/07/2014 3:08:29 PM TC back to office staff as having issues accessing EPIC to schedule appt. Office staff able to schedule appt tomorrow at 8:45 am with PCP. TC back to pt to update. Pt states she will make appt User: Gennie Alma, RN Date/Time Eilene Ghazi Time): 11/07/2014 3:09:52 PM Office staff also states they will include fax of this call to update pt record as EPIC unable to be accessed currently for update

## 2014-11-14 NOTE — Telephone Encounter (Signed)
Patient states when she cuts them with a pill cutter she is getting a little more than half. She is wondering if she should keep doing this or go back to taking a whole one

## 2014-11-15 MED ORDER — AMLODIPINE BESYLATE 5 MG PO TABS: 5.0000 mg | ORAL_TABLET | Freq: Every day | ORAL | Status: DC

## 2014-11-15 NOTE — Addendum Note (Signed)
Addended by: Cresenciano Lick on: 11/15/2014 08:56 AM   Modules accepted: Orders, Medications

## 2014-11-15 NOTE — Telephone Encounter (Signed)
PCP is out of office all week. I spoke to Dr. Doug Sou about this. She advises pt to continue 5 mg, by continuing to cut the current 10 mg tabs she already has OR we can send in new Rx for Amlodipine 5 mg. Pt informed of this. She states she will finish what she currently has and would like a new script next week. See meds. Pt advised to keep ROV with PCP as scheduled or call us if she needs [place] sooner.

## 2014-11-21 ENCOUNTER — Ambulatory Visit: Payer: Commercial Managed Care - HMO | Admitting: Internal Medicine

## 2014-11-25 ENCOUNTER — Other Ambulatory Visit: Payer: Self-pay | Admitting: Internal Medicine

## 2014-11-27 NOTE — Telephone Encounter (Signed)
REFILL 

## 2014-11-28 ENCOUNTER — Other Ambulatory Visit: Payer: Self-pay | Admitting: Internal Medicine

## 2014-11-28 NOTE — Telephone Encounter (Signed)
Rx(s) sent to pharmacy electronically. OV 12/04/14

## 2014-12-04 ENCOUNTER — Ambulatory Visit (INDEPENDENT_AMBULATORY_CARE_PROVIDER_SITE_OTHER): Payer: Commercial Managed Care - HMO | Admitting: Internal Medicine

## 2014-12-04 ENCOUNTER — Ambulatory Visit (INDEPENDENT_AMBULATORY_CARE_PROVIDER_SITE_OTHER): Payer: Commercial Managed Care - HMO | Admitting: Pharmacist Clinician (PhC)/ Clinical Pharmacy Specialist

## 2014-12-04 VITALS — BP 122/72 | HR 60 | Ht 66.25 in | Wt 188.6 lb

## 2014-12-04 DIAGNOSIS — I471 Supraventricular tachycardia: Secondary | ICD-10-CM

## 2014-12-04 DIAGNOSIS — Z7901 Long term (current) use of anticoagulants: Secondary | ICD-10-CM | POA: Diagnosis not present

## 2014-12-04 DIAGNOSIS — F411 Generalized anxiety disorder: Secondary | ICD-10-CM

## 2014-12-04 DIAGNOSIS — I4891 Unspecified atrial fibrillation: Secondary | ICD-10-CM

## 2014-12-04 DIAGNOSIS — I4719 Other supraventricular tachycardia: Secondary | ICD-10-CM

## 2014-12-04 DIAGNOSIS — I10 Essential (primary) hypertension: Secondary | ICD-10-CM | POA: Diagnosis not present

## 2014-12-04 DIAGNOSIS — E7800 Pure hypercholesterolemia, unspecified: Secondary | ICD-10-CM

## 2014-12-04 DIAGNOSIS — I059 Rheumatic mitral valve disease, unspecified: Secondary | ICD-10-CM

## 2014-12-04 DIAGNOSIS — I480 Paroxysmal atrial fibrillation: Secondary | ICD-10-CM

## 2014-12-04 LAB — POCT INR: INR: 2.8

## 2014-12-04 NOTE — Patient Instructions (Signed)
Dr Debara Pickett recommends that you schedule a follow-up appointment in 1 year. You will receive a reminder letter in the mail two months in advance. If you don't receive a letter, please call our office to schedule the follow-up appointment.  Dr Debara Pickett recommends you get compression stockings (20-30 mmHg) . A brochure to [employer] has been provided.

## 2014-12-05 ENCOUNTER — Encounter: Payer: Self-pay | Admitting: Internal Medicine

## 2014-12-05 NOTE — Progress Notes (Signed)
OFFICE NOTE  Chief Complaint:  Routine follow-up, occasional palpitations  Primary Care Physician: Walker Kehr, MD  HPI:  [name]  is a 80 year old female with a history of paroxysmal atrial fibrillation and SVT in the past. This happened when she underwent a Myoview for chest pain in the hospital. She was placed on metoprolol succinate 25 b.i.d. and has done well with that with improvement in her palpitations. Her echo showed an EF greater than 55% with some mild aortic insufficiency and an aneurysmal atrial septum. She is on simvastatin as well as Coumadin managed by you. She's recently had some abnormal labs which showed borderline diabetes. She's not currently a medication for that. She reports occasional intermittent chest pain. She underwent a cardia catheterization in the past which showed mild to moderate nonobstructive coronary disease which is managed medically. She does also have bilateral venous insufficiency of the lower chin and knees and has worn compression stockings with improvement in her swelling and symptoms.  [name] returns today without any new complaints. She denies any shortness of breath or chest pain. She does get some occasional chest wall pain and is relieved with tramadol.  I saw [name] back in the office today. Overall she reports she is doing very well. She's had very few episodes of palpitations. She has had a decreased dose in her amlodipine to 5 mg daily, likely due to leg swelling. She is now taking Lasix as needed for edema. She's had a few episodes of racing heart for which she is to perform vagal maneuvers successfully. EKG shows sinus rhythm with PVCs and PACs but no evidence of atrial fibrillation in the office today. She is on warfarin anticoagulation. We will check the INR in the office today.  PMHx:  Past Medical History  Diagnosis Date  . Bronchitis, not specified as acute or chronic   . Mitral valve disorders   .  Essential hypertension   . Unspecified venous (peripheral) insufficiency   . Phlebitis and thrombophlebitis of superficial vessels of lower extremities   . Dyslipidemia   . Other abnormal glucose   . Diaphragmatic hernia without mention of obstruction or gangrene   . Irritable bowel syndrome   . Osteoarthrosis, unspecified whether generalized or localized, unspecified site   . Osteoporosis, unspecified   . Unspecified vitamin D deficiency   . Anxiety state, unspecified   . History of palpitations   . CAD (coronary artery disease)   . PAF (paroxysmal atrial fibrillation)   . History of nuclear stress test 07/2010    lexiscan - no perfusion defects     Past Surgical History  Procedure Laterality Date  . Vesicovaginal fistula closure w/ tah    . Cardiac catheterization  07/30/2010    nonobstructive CAD, 20-40% RCA stenosis, 50-60% eccentric LAD stenosis more prox, 40% LAD stenosis more distal, EF 65%  . Transthoracic echocardiogram  09/03/2010    EF=>55%; mild MR; mod TR; AV mildly sclerotic with trace regurg; mild pulm valve regurg    FAMHx:  Family History  Problem Relation Age of Onset  . Stroke Mother   . Stroke Father     also heart disease  . Stroke Brother     also heart disease  . Clotting disorder Paternal Grandmother     blood clot    SOCHx:   reports that she has never smoked. She has never used smokeless tobacco. She reports that she does not drink alcohol or use illicit drugs.  ALLERGIES:  Allergies  Allergen Reactions  . Clarithromycin     REACTION: unspecified  . Guaifenesin Er     itching  . Metformin     REACTION: pt states INTOL to Metformin "felt drained"  . Tape     Adhesive tape---rash    ROS: A comprehensive review of systems was negative except for: Cardiovascular: positive for palpitations  HOME MEDS: Current Outpatient Prescriptions  Medication Sig Dispense Refill  . ALPRAZolam (XANAX) 0.5 MG tablet take 1/2-1 tablet by mouth three times  a day if needed 90 tablet 1  . alum & mag hydroxide-simeth (MAALOX ADVANCED MAX ST) 400-400-40 MG/5ML suspension Take 15 mLs by mouth as needed.      [name] amLODipine (NORVASC) 5 MG tablet Take 1 tablet (5 mg total) by mouth daily. 30 tablet 3  . aspirin 81 MG tablet Take 81 mg by mouth daily.      . clidinium-chlordiazePOXIDE (LIBRAX) 5-2.5 MG per capsule Take 1 capsule by mouth 3 (three) times daily as needed (for cramps). 60 capsule 2  . diclofenac sodium (VOLTAREN) 1 % GEL Apply to left ankle four times per day per Dr. Samara Snide    . fluocinonide cream (LIDEX) 0.05 % Apply twice daily to rash as needed 30 g 5  . furosemide (LASIX) 20 MG tablet Take 1 tablet (20 mg total) by mouth daily as needed for edema. 30 tablet 3  . hydroxypropyl methylcellulose (ISOPTO TEARS) 2.5 % ophthalmic solution Place 1 drop into both eyes 2 (two) times daily.      . hydrOXYzine (ATARAX/VISTARIL) 25 MG tablet Take 1 tablet (25 mg total) by mouth every 8 (eight) hours as needed for itching. 60 tablet 0  . isosorbide mononitrate (IMDUR) 30 MG 24 hr tablet take 1 tablet by mouth once daily 30 tablet 9  . loperamide (IMODIUM A-D) 2 MG tablet Take 1 tablet (2 mg total) by mouth 4 (four) times daily as needed for diarrhea or loose stools. 30 tablet 0  . metoprolol succinate (TOPROL-XL) 25 MG 24 hr tablet take 1 tablet by mouth twice a day 60 tablet 1  . ONE TOUCH ULTRA TEST test strip TEST ONCE A DAY OR AS DIRECTED. 100 each 1  . pantoprazole (PROTONIX) 40 MG tablet take 1 tablet by mouth once daily 30 tablet 0  . promethazine (PHENERGAN) 12.5 MG suppository Place 1 suppository (12.5 mg total) rectally every 6 (six) hours as needed for nausea or vomiting. 30 each 0  . simvastatin (ZOCOR) 20 MG tablet take 1 tablet by mouth at bedtime 90 tablet 3  . traMADol (ULTRAM) 50 MG tablet take 1 tablet by mouth three times a day for pain **NOT TO EXCEED 3 TABS DAILY** 90 tablet 1  . Vitamin D, Ergocalciferol, (DRISDOL) 50000 UNITS CAPS  capsule take 1 capsule by mouth every week 4 capsule 1  . warfarin (COUMADIN) 5 MG tablet take 1 tablet by mouth once daily OR AS DIRCETED BY COUMADIN CLINIC 30 tablet 3   No current facility-administered medications for this visit.    LABS/IMAGING: No results found for this or any previous visit (from the past 48 hour(s)). No results found.  VITALS: BP 122/72 mmHg  Pulse 60  Ht 5' 6.25" (1.683 m)  Wt 188 lb 9.6 oz (85.548 kg)  BMI 30.20 kg/m2  EXAM: General appearance: alert and no distress Neck: no carotid bruit and no JVD Lungs: clear to auscultation bilaterally Heart: regular rate and rhythm, S1, S2 normal, no murmur, click, rub or  gallop Abdomen: soft, non-tender; bowel sounds normal; no masses,  no organomegaly and overweight Extremities: extremities normal, atraumatic, no cyanosis or edema Pulses: 2+ and symmetric Skin: Skin color, texture, turgor normal. No rashes or lesions Neurologic: Grossly normal Psych: Mood, affect normal  EKG: Normal sinus rhythm at 60, occasional PVCs and PACs  ASSESSMENT: 1. Paroxysmal atrial fibrillation on warfarin - CHADSVASC score of 4 2. Hypertension-controlled 3. Dyslipidemia-on statin 4. Overweight 5. Moderate, non-obstructive CAD by cath in 2012 6. PSVT - controlled by b-blocker and vagal maneuvers  PLAN: 1.   Ms. Haroon is doing well overall with relatively infrequent occurrences of paroxysmal tachyarrhythmias. Her INR has been well controlled. She's had very infrequent tachyarrhythmias which seem to be improved with vagal maneuvers. Overall she is doing well on blood pressures controlled today. Her swelling is well controlled with Lasix as needed, about 3 times weekly. I also advised that she wear bilateral knee high compression stockings that are at least 20-30 mmHg strength.  Plan to see her back annually or sooner as necessary.  Pixie Casino, MD, [facility] Attending Cardiologist [place] 12/05/2014, 8:42 PM

## 2014-12-06 ENCOUNTER — Other Ambulatory Visit: Payer: Self-pay | Admitting: Internal Medicine

## 2014-12-07 NOTE — Telephone Encounter (Signed)
Please advise, thanks.

## 2014-12-19 ENCOUNTER — Other Ambulatory Visit: Payer: Self-pay | Admitting: Internal Medicine

## 2014-12-21 NOTE — Telephone Encounter (Signed)
Pt called to check up on this request, pt is out of this med

## 2014-12-23 ENCOUNTER — Other Ambulatory Visit: Payer: Self-pay | Admitting: Internal Medicine

## 2014-12-25 ENCOUNTER — Other Ambulatory Visit: Payer: Self-pay

## 2014-12-25 MED ORDER — VITAMIN D (ERGOCALCIFEROL) 1.25 MG (50000 UNIT) PO CAPS: 50000.0000 [IU] | ORAL_CAPSULE | ORAL | Status: DC

## 2015-01-05 ENCOUNTER — Other Ambulatory Visit: Payer: Self-pay | Admitting: Internal Medicine

## 2015-01-08 DIAGNOSIS — R312 Other microscopic hematuria: Secondary | ICD-10-CM | POA: Diagnosis not present

## 2015-01-15 ENCOUNTER — Ambulatory Visit (INDEPENDENT_AMBULATORY_CARE_PROVIDER_SITE_OTHER): Payer: Commercial Managed Care - HMO | Admitting: Pharmacist Clinician (PhC)/ Clinical Pharmacy Specialist

## 2015-01-15 DIAGNOSIS — Z7901 Long term (current) use of anticoagulants: Secondary | ICD-10-CM | POA: Diagnosis not present

## 2015-01-15 DIAGNOSIS — I4891 Unspecified atrial fibrillation: Secondary | ICD-10-CM | POA: Diagnosis not present

## 2015-01-15 DIAGNOSIS — I480 Paroxysmal atrial fibrillation: Secondary | ICD-10-CM

## 2015-01-15 LAB — POCT INR: INR: 2.3

## 2015-01-27 ENCOUNTER — Other Ambulatory Visit: Payer: Self-pay | Admitting: Internal Medicine

## 2015-02-02 ENCOUNTER — Other Ambulatory Visit: Payer: Self-pay | Admitting: Internal Medicine

## 2015-02-05 ENCOUNTER — Telehealth: Payer: Self-pay | Admitting: Internal Medicine

## 2015-02-05 NOTE — Telephone Encounter (Signed)
Patient is asking if it is ok to take  alendronate (FOSAMAX) 70 MG tablet [[id number]] DISCONTINUED until she sees [place] on the 27th

## 2015-02-07 NOTE — Telephone Encounter (Signed)
Pt informed

## 2015-02-07 NOTE — Telephone Encounter (Signed)
pls wait till I see her Thx

## 2015-02-08 ENCOUNTER — Encounter: Payer: Self-pay | Admitting: Internal Medicine

## 2015-02-08 ENCOUNTER — Other Ambulatory Visit (INDEPENDENT_AMBULATORY_CARE_PROVIDER_SITE_OTHER): Payer: Commercial Managed Care - HMO

## 2015-02-08 ENCOUNTER — Ambulatory Visit (INDEPENDENT_AMBULATORY_CARE_PROVIDER_SITE_OTHER): Payer: Commercial Managed Care - HMO | Admitting: Internal Medicine

## 2015-02-08 VITALS — BP 130/70 | HR 62 | Wt 180.0 lb

## 2015-02-08 DIAGNOSIS — I872 Venous insufficiency (chronic) (peripheral): Secondary | ICD-10-CM | POA: Diagnosis not present

## 2015-02-08 DIAGNOSIS — E119 Type 2 diabetes mellitus without complications: Secondary | ICD-10-CM

## 2015-02-08 DIAGNOSIS — I10 Essential (primary) hypertension: Secondary | ICD-10-CM | POA: Diagnosis not present

## 2015-02-08 DIAGNOSIS — I480 Paroxysmal atrial fibrillation: Secondary | ICD-10-CM | POA: Diagnosis not present

## 2015-02-08 DIAGNOSIS — Z23 Encounter for immunization: Secondary | ICD-10-CM | POA: Diagnosis not present

## 2015-02-08 DIAGNOSIS — F411 Generalized anxiety disorder: Secondary | ICD-10-CM

## 2015-02-08 DIAGNOSIS — I2583 Coronary atherosclerosis due to lipid rich plaque: Principal | ICD-10-CM

## 2015-02-08 DIAGNOSIS — I2510 Atherosclerotic heart disease of native coronary artery without angina pectoris: Secondary | ICD-10-CM | POA: Diagnosis not present

## 2015-02-08 DIAGNOSIS — M810 Age-related osteoporosis without current pathological fracture: Secondary | ICD-10-CM

## 2015-02-08 LAB — BASIC METABOLIC PANEL
BUN: 13 mg/dL (ref 6–23)
CALCIUM: 9.3 mg/dL (ref 8.4–10.5)
CO2: 29 meq/L (ref 19–32)
CREATININE: 1.23 mg/dL — AB (ref 0.40–1.20)
Chloride: 102 mEq/L (ref 96–112)
GFR: 54.26 mL/min — AB (ref >60.00)
Glucose, Bld: 122 mg/dL — ABNORMAL HIGH (ref 70–99)
Potassium: 3.9 mEq/L (ref 3.5–5.1)
SODIUM: 138 meq/L (ref 135–145)

## 2015-02-08 LAB — HEMOGLOBIN A1C: Hgb A1c MFr Bld: 6.8 % — ABNORMAL HIGH (ref 4.6–6.5)

## 2015-02-08 MED ORDER — VITAMIN D3 50 MCG (2000 UT) PO CAPS: 2000.0000 [IU] | ORAL_CAPSULE | Freq: Every day | ORAL | Status: DC

## 2015-02-08 MED ORDER — ALPRAZOLAM 0.5 MG PO TABS: ORAL_TABLET | ORAL | Status: DC

## 2015-02-08 NOTE — Assessment & Plan Note (Signed)
Compr socks Diuretic: prn Lasix

## 2015-02-08 NOTE — Assessment & Plan Note (Signed)
Simvastatin, Toprol, Isosorbide, Amlodipine 

## 2015-02-08 NOTE — Assessment & Plan Note (Signed)
Xanax prn  Potential benefits of a long term benzodiazepines  use as well as potential risks  and complications were explained to the patient and were aknowledged. 

## 2015-02-08 NOTE — Assessment & Plan Note (Signed)
On metoprolol

## 2015-02-08 NOTE — Progress Notes (Signed)
Pre visit review using our clinic review tool, if applicable. No additional management support is needed unless otherwise documented below in the visit note. 

## 2015-02-08 NOTE — Assessment & Plan Note (Signed)
Vit D Fosamax intolerant

## 2015-02-08 NOTE — Assessment & Plan Note (Signed)
Labs

## 2015-02-08 NOTE — Progress Notes (Signed)
Subjective:  Patient ID: [name], female    DOB: 09-30-1934  Age: 80 y.o. MRN: [id number]  CC: No chief complaint on file.   HPI JAMONI BROADFOOT presents for anxiety, CAD, HTN, OA f/u. Son is sick w/cancer at [facility] stopped Fosamax - pains are better. F/u LE edema - better  Outpatient Prescriptions Prior to Visit  Medication Sig Dispense Refill  . amLODipine (NORVASC) 5 MG tablet Take 1 tablet (5 mg total) by mouth daily. 30 tablet 3  . aspirin 81 MG tablet Take 81 mg by mouth daily.      . diclofenac sodium (VOLTAREN) 1 % GEL Apply to left ankle four times per day per Dr. Samara Snide    . fluocinonide cream (LIDEX) 0.05 % Apply twice daily to rash as needed 30 g 5  . furosemide (LASIX) 20 MG tablet Take 1 tablet (20 mg total) by mouth daily as needed for edema. 30 tablet 3  . hydroxypropyl methylcellulose (ISOPTO TEARS) 2.5 % ophthalmic solution Place 1 drop into both eyes 2 (two) times daily.      . hydrOXYzine (ATARAX/VISTARIL) 25 MG tablet Take 1 tablet (25 mg total) by mouth every 8 (eight) hours as needed for itching. 60 tablet 0  . isosorbide mononitrate (IMDUR) 30 MG 24 hr tablet take 1 tablet by mouth once daily 30 tablet 9  . loperamide (IMODIUM A-D) 2 MG tablet Take 1 tablet (2 mg total) by mouth 4 (four) times daily as needed for diarrhea or loose stools. 30 tablet 0  . metoprolol succinate (TOPROL-XL) 25 MG 24 hr tablet take 1 tablet by mouth twice a day 60 tablet 3  . ONE TOUCH ULTRA TEST test strip TEST ONCE A DAY OR AS DIRECTED. 100 each 1  . pantoprazole (PROTONIX) 40 MG tablet take 1 tablet by mouth once daily 30 tablet 6  . simvastatin (ZOCOR) 20 MG tablet take 1 tablet by mouth at bedtime 90 tablet 3  . traMADol (ULTRAM) 50 MG tablet take 1 tablet by mouth three times a day if needed for pain *NOT TO EXCEED 3 TABLETS DAILY 90 tablet 1  . warfarin (COUMADIN) 5 MG tablet take 1 tablet by mouth once daily or as directed BY COUMADIN CLINIC 30 tablet 3  .  ALPRAZolam (XANAX) 0.5 MG tablet take 1/2-1 tablet by mouth three times a day if needed 90 tablet 1  . alum & mag hydroxide-simeth (MAALOX ADVANCED MAX ST) 400-400-40 MG/5ML suspension Take 15 mLs by mouth as needed.      . clidinium-chlordiazePOXIDE (LIBRAX) 5-2.5 MG per capsule Take 1 capsule by mouth 3 (three) times daily as needed (for cramps). 60 capsule 2  . metoprolol succinate (TOPROL-XL) 25 MG 24 hr tablet take 1 tablet by mouth twice a day 60 tablet 11  . promethazine (PHENERGAN) 12.5 MG suppository Place 1 suppository (12.5 mg total) rectally every 6 (six) hours as needed for nausea or vomiting. 30 each 0  . Vitamin D, Ergocalciferol, (DRISDOL) 50000 UNITS CAPS capsule Take 1 capsule (50,000 Units total) by mouth once a week. 4 capsule 2   No facility-administered medications prior to visit.    ROS Review of Systems  Constitutional: Positive for fatigue. Negative for chills, activity change, appetite change and unexpected weight change.  HENT: Negative for congestion, mouth sores and sinus pressure.   Eyes: Negative for visual disturbance.  Respiratory: Negative for cough and chest tightness.   Gastrointestinal: Negative for nausea and abdominal pain.  Genitourinary: Negative  for frequency, difficulty urinating and vaginal pain.  Musculoskeletal: Negative for back pain and gait problem.  Skin: Negative for pallor and rash.  Neurological: Negative for dizziness, tremors, weakness, numbness and headaches.  Psychiatric/Behavioral: Negative for confusion, sleep disturbance and decreased concentration. The patient is nervous/anxious.     Objective:  BP 130/70 mmHg  Pulse 62  Wt 180 lb (81.647 kg)  SpO2 95%  BP Readings from Last 3 Encounters:  02/08/15 130/70  12/04/14 122/72  11/08/14 136/70    Wt Readings from Last 3 Encounters:  02/08/15 180 lb (81.647 kg)  12/04/14 188 lb 9.6 oz (85.548 kg)  11/08/14 188 lb (85.276 kg)    Physical Exam  Constitutional: She appears  well-developed. No distress.  HENT:  Head: Normocephalic.  Right Ear: External ear normal.  Left Ear: External ear normal.  Nose: Nose normal.  Mouth/Throat: Oropharynx is clear and moist.  Eyes: Conjunctivae are normal. Pupils are equal, round, and reactive to light. Right eye exhibits no discharge. Left eye exhibits no discharge.  Neck: Normal range of motion. Neck supple. No JVD present. No tracheal deviation present. No thyromegaly present.  Cardiovascular: Normal rate, regular rhythm and normal heart sounds.   Pulmonary/Chest: No stridor. No respiratory distress. She has no wheezes.  Abdominal: Soft. Bowel sounds are normal. She exhibits no distension and no mass. There is no tenderness. There is no rebound and no guarding.  Musculoskeletal: She exhibits tenderness. She exhibits no edema.  Lymphadenopathy:    She has no cervical adenopathy.  Neurological: She displays normal reflexes. No cranial nerve deficit. She exhibits normal muscle tone. Coordination abnormal.  Skin: No rash noted. No erythema.  Psychiatric: She has a normal mood and affect. Her behavior is normal. Judgment and thought content normal.  R shoulder hurts w/ROM LS is tender  Lab Results  Component Value Date   WBC 6.2 08/08/2014   HGB 12.8 08/08/2014   HCT 37.9 08/08/2014   PLT 248.0 08/08/2014   GLUCOSE 122* 02/08/2015   CHOL 191 08/04/2013   TRIG 78.0 08/04/2013   HDL 79.20 08/04/2013   LDLDIRECT 105.5 11/26/2010   LDLCALC 96 08/04/2013   ALT 13 08/08/2014   AST 18 08/08/2014   NA 138 02/08/2015   K 3.9 02/08/2015   CL 102 02/08/2015   CREATININE 1.23* 02/08/2015   BUN 13 02/08/2015   CO2 29 02/08/2015   TSH 1.04 08/04/2013   INR 2.3 01/15/2015   HGBA1C 6.8* 02/08/2015    Mm Digital Screening Bilateral  07/27/2014  CLINICAL DATA:  Screening. EXAM: DIGITAL SCREENING BILATERAL MAMMOGRAM WITH CAD COMPARISON:  Previous exam(s). ACR Breast Density Category c: The breast tissue is heterogeneously  dense, which may obscure small masses. FINDINGS: There are no findings suspicious for malignancy. Images were processed with CAD. IMPRESSION: No mammographic evidence of malignancy. A result letter of this screening mammogram will be mailed directly to the patient. RECOMMENDATION: Screening mammogram in one year. (Code:SM-B-01Y) BI-RADS CATEGORY  1: Negative. Electronically Signed   By: Andres Shad   On: 07/27/2014 16:21    Assessment & Plan:   Diagnoses and all orders for this visit:  Coronary artery disease due to lipid rich plaque -     Basic metabolic panel; Future -     Hemoglobin A1c; Future  Essential hypertension -     Basic metabolic panel; Future -     Hemoglobin A1c; Future  Chronic venous insufficiency -     Basic metabolic panel; Future -  Hemoglobin A1c; Future  Paroxysmal atrial fibrillation (HCC) -     Basic metabolic panel; Future -     Hemoglobin A1c; Future  Type 2 diabetes mellitus without complication, without long-term current use of insulin (HCC) -     Basic metabolic panel; Future -     Hemoglobin A1c; Future  Anxiety state -     Basic metabolic panel; Future -     Hemoglobin A1c; Future  Osteoporosis -     Basic metabolic panel; Future -     Hemoglobin A1c; Future  Need for influenza vaccination -     Flu Vaccine QUAD 36+ mos IM  Other orders -     Cholecalciferol (VITAMIN D3) 2000 UNITS capsule; Take 1 capsule (2,000 Units total) by mouth daily. -     ALPRAZolam (XANAX) 0.5 MG tablet; take 1/2-1 tablet by mouth three times a day if needed   I have discontinued Ms. Sheriff's alum & mag hydroxide-simeth, promethazine, clidinium-chlordiazePOXIDE, and Vitamin D (Ergocalciferol). I am also having her start on Vitamin D3. Additionally, I am having her maintain her aspirin, hydroxypropyl methylcellulose / hypromellose, ONE TOUCH ULTRA TEST, diclofenac sodium, fluocinonide cream, simvastatin, loperamide, hydrOXYzine, isosorbide mononitrate,  furosemide, amLODipine, traMADol, pantoprazole, warfarin, metoprolol succinate, and ALPRAZolam.  Meds ordered this encounter  Medications  . Cholecalciferol (VITAMIN D3) 2000 UNITS capsule    Sig: Take 1 capsule (2,000 Units total) by mouth daily.    Dispense:  100 capsule    Refill:  3  . ALPRAZolam (XANAX) 0.5 MG tablet    Sig: take 1/2-1 tablet by mouth three times a day if needed    Dispense:  90 tablet    Refill:  2     Follow-up: Return in about 3 months (around 05/11/2015) for a follow-up visit.  Walker Kehr, MD

## 2015-02-08 NOTE — Assessment & Plan Note (Signed)
On Coumadin 

## 2015-02-26 ENCOUNTER — Ambulatory Visit: Payer: Commercial Managed Care - HMO | Admitting: Pharmacist Clinician (PhC)/ Clinical Pharmacy Specialist

## 2015-03-01 ENCOUNTER — Ambulatory Visit (INDEPENDENT_AMBULATORY_CARE_PROVIDER_SITE_OTHER): Payer: Commercial Managed Care - HMO | Admitting: Pharmacist Clinician (PhC)/ Clinical Pharmacy Specialist

## 2015-03-01 DIAGNOSIS — I480 Paroxysmal atrial fibrillation: Secondary | ICD-10-CM | POA: Diagnosis not present

## 2015-03-01 DIAGNOSIS — I4891 Unspecified atrial fibrillation: Secondary | ICD-10-CM

## 2015-03-01 DIAGNOSIS — Z7901 Long term (current) use of anticoagulants: Secondary | ICD-10-CM

## 2015-03-01 LAB — POCT INR: INR: 2.1

## 2015-03-28 DIAGNOSIS — E119 Type 2 diabetes mellitus without complications: Secondary | ICD-10-CM | POA: Diagnosis not present

## 2015-03-28 DIAGNOSIS — H04123 Dry eye syndrome of bilateral lacrimal glands: Secondary | ICD-10-CM | POA: Diagnosis not present

## 2015-03-28 DIAGNOSIS — H25813 Combined forms of age-related cataract, bilateral: Secondary | ICD-10-CM | POA: Diagnosis not present

## 2015-04-09 DIAGNOSIS — J069 Acute upper respiratory infection, unspecified: Secondary | ICD-10-CM | POA: Diagnosis not present

## 2015-04-11 ENCOUNTER — Ambulatory Visit: Payer: Commercial Managed Care - HMO | Admitting: Pharmacist Clinician (PhC)/ Clinical Pharmacy Specialist

## 2015-04-16 ENCOUNTER — Other Ambulatory Visit: Payer: Self-pay | Admitting: Internal Medicine

## 2015-04-17 ENCOUNTER — Encounter: Payer: Self-pay | Admitting: Internal Medicine

## 2015-04-17 ENCOUNTER — Ambulatory Visit (INDEPENDENT_AMBULATORY_CARE_PROVIDER_SITE_OTHER): Payer: Commercial Managed Care - HMO | Admitting: Internal Medicine

## 2015-04-17 VITALS — BP 138/80 | HR 63 | Temp 98.5°F | Ht 65.0 in | Wt 182.0 lb

## 2015-04-17 DIAGNOSIS — E119 Type 2 diabetes mellitus without complications: Secondary | ICD-10-CM

## 2015-04-17 DIAGNOSIS — J069 Acute upper respiratory infection, unspecified: Secondary | ICD-10-CM | POA: Diagnosis not present

## 2015-04-17 DIAGNOSIS — I10 Essential (primary) hypertension: Secondary | ICD-10-CM

## 2015-04-17 MED ORDER — HYDROCODONE-HOMATROPINE 5-1.5 MG/5ML PO SYRP: 5.0000 mL | ORAL_SOLUTION | Freq: Four times a day (QID) | ORAL | Status: DC | PRN

## 2015-04-17 MED ORDER — LEVOFLOXACIN 250 MG PO TABS: 250.0000 mg | ORAL_TABLET | Freq: Every day | ORAL | Status: DC

## 2015-04-17 NOTE — Assessment & Plan Note (Signed)
stable overall by history and exam, recent data reviewed with pt, and pt to continue medical treatment as before,  to f/u any worsening symptoms or concerns BP Readings from Last 3 Encounters:  04/17/15 138/80  02/08/15 130/70  12/04/14 122/72

## 2015-04-17 NOTE — Assessment & Plan Note (Signed)
Mild to mod, for antibx course,  to f/u any worsening symptoms or concerns 

## 2015-04-17 NOTE — Progress Notes (Signed)
Subjective:    Patient ID: [name], female    DOB: June 20, 1934, 81 y.o.   MRN: [id number]  HPI   Here with 2-3 days acute onset fever, facial pain, pressure, headache, general weakness and malaise, and greenish d/c, with mild ST and cough, but pt denies chest pain, wheezing, increased sob or doe, orthopnea, PND, increased LE swelling, palpitations, dizziness or syncope. Was seen at [facility] in [place] 1 wk ago with less severe symptoms, that seemed to get better, then worse again Past Medical History  Diagnosis Date  . Bronchitis, not specified as acute or chronic   . Mitral valve disorders   . Essential hypertension   . Unspecified venous (peripheral) insufficiency   . Phlebitis and thrombophlebitis of superficial vessels of lower extremities   . Dyslipidemia   . Other abnormal glucose   . Diaphragmatic hernia without mention of obstruction or gangrene   . Irritable bowel syndrome   . Osteoarthrosis, unspecified whether generalized or localized, unspecified site   . Osteoporosis, unspecified   . Unspecified vitamin D deficiency   . Anxiety state, unspecified   . History of palpitations   . CAD (coronary artery disease)   . PAF (paroxysmal atrial fibrillation) ([place])   . History of nuclear stress test 07/2010    lexiscan - no perfusion defects    Past Surgical History  Procedure Laterality Date  . Vesicovaginal fistula closure w/ tah    . Cardiac catheterization  07/30/2010    nonobstructive CAD, 20-40% RCA stenosis, 50-60% eccentric LAD stenosis more prox, 40% LAD stenosis more distal, EF 65%  . Transthoracic echocardiogram  09/03/2010    EF=>55%; mild MR; mod TR; AV mildly sclerotic with trace regurg; mild pulm valve regurg    reports that she has never smoked. She has never used smokeless tobacco. She reports that she does not drink alcohol or use illicit drugs. family history includes Clotting disorder in her paternal grandmother; Stroke in her brother, father, and  mother. Allergies  Allergen Reactions  . Clarithromycin     REACTION: unspecified  . Fosamax [Alendronate Sodium]     achy  . Guaifenesin Er     itching  . Metformin     REACTION: pt states INTOL to Metformin "felt drained"  . Tape     Adhesive tape---rash   Current Outpatient Prescriptions on File Prior to Visit  Medication Sig Dispense Refill  . ALPRAZolam (XANAX) 0.5 MG tablet take 1/2-1 tablet by mouth three times a day if needed 90 tablet 2  . amLODipine (NORVASC) 5 MG tablet take 1 tablet by mouth once daily 30 tablet 3  . aspirin 81 MG tablet Take 81 mg by mouth daily.      . Cholecalciferol (VITAMIN D3) 2000 UNITS capsule Take 1 capsule (2,000 Units total) by mouth daily. 100 capsule 3  . diclofenac sodium (VOLTAREN) 1 % GEL Apply to left ankle four times per day per Dr. Samara Snide    . fluocinonide cream (LIDEX) 0.05 % Apply twice daily to rash as needed 30 g 5  . furosemide (LASIX) 20 MG tablet Take 1 tablet (20 mg total) by mouth daily as needed for edema. 30 tablet 3  . hydroxypropyl methylcellulose (ISOPTO TEARS) 2.5 % ophthalmic solution Place 1 drop into both eyes 2 (two) times daily.      . hydrOXYzine (ATARAX/VISTARIL) 25 MG tablet Take 1 tablet (25 mg total) by mouth every 8 (eight) hours as needed for itching. 60 tablet 0  .  isosorbide mononitrate (IMDUR) 30 MG 24 hr tablet take 1 tablet by mouth once daily 30 tablet 9  . loperamide (IMODIUM A-D) 2 MG tablet Take 1 tablet (2 mg total) by mouth 4 (four) times daily as needed for diarrhea or loose stools. 30 tablet 0  . metoprolol succinate (TOPROL-XL) 25 MG 24 hr tablet take 1 tablet by mouth twice a day 60 tablet 3  . ONE TOUCH ULTRA TEST test strip TEST ONCE A DAY OR AS DIRECTED. 100 each 1  . pantoprazole (PROTONIX) 40 MG tablet take 1 tablet by mouth once daily 30 tablet 6  . simvastatin (ZOCOR) 20 MG tablet take 1 tablet by mouth at bedtime 90 tablet 3  . traMADol (ULTRAM) 50 MG tablet take 1 tablet by mouth three  times a day if needed for pain *NOT TO EXCEED 3 TABLETS DAILY 90 tablet 1  . Vitamin D, Ergocalciferol, (DRISDOL) 50000 UNITS CAPS capsule Take 1 capsule by mouth every 7 (seven) days. On Thursdays  0  . warfarin (COUMADIN) 5 MG tablet take 1 tablet by mouth once daily or as directed BY COUMADIN CLINIC 30 tablet 3   No current facility-administered medications on file prior to visit.   Review of Systems  Constitutional: Negative for unusual diaphoresis or night sweats HENT: Negative for ringing in ear or discharge Eyes: Negative for double vision or worsening visual disturbance.  Respiratory: Negative for choking and stridor.   Gastrointestinal: Negative for vomiting or other signifcant bowel change Genitourinary: Negative for hematuria or change in urine volume.  Musculoskeletal: Negative for other MSK pain or swelling Skin: Negative for color change and worsening wound.  Neurological: Negative for tremors and numbness other than noted  Psychiatric/Behavioral: Negative for decreased concentration or agitation other than above       Objective:   Physical Exam BP 138/80 mmHg  Pulse 63  Temp(Src) 98.5 F (36.9 C) (Oral)  Ht 5\' 5"  (1.651 m)  Wt 182 lb (82.555 kg)  BMI 30.29 kg/m2  SpO2 97% VS noted, mild ill Constitutional: Pt appears in no significant distress HENT: Head: NCAT.  Right Ear: External ear normal.  Left Ear: External ear normal.  Bilat tm's with mild erythema.  Max sinus areas mild tender.  Pharynx with mild erythema, no exudate Eyes: . Pupils are equal, round, and reactive to light. Conjunctivae and EOM are normal Neck: Normal range of motion. Neck supple. has mild left submandibular LA tender < 1 cm Cardiovascular: Normal rate and regular rhythm.   Pulmonary/Chest: Effort normal and breath sounds decresaed bilat without rales or wheezing.  Neurological: Pt is alert. Not confused , motor grossly intact Skin: Skin is warm. No rash, no LE edema Psychiatric: Pt  behavior is normal. No agitation.      Assessment & Plan:

## 2015-04-17 NOTE — Assessment & Plan Note (Signed)
stable overall by history and exam, recent data reviewed with pt, and pt to continue medical treatment as before,  to f/u any worsening symptoms or concerns Lab Results  Component Value Date   HGBA1C 6.8* 02/08/2015   For pt to call for onset polys or cbg > 200 with illness

## 2015-04-17 NOTE — Patient Instructions (Signed)
Please take all new medication as prescribed - the antibiotic, and cough medicine ° °Please continue all other medications as before, and refills have been done if requested. ° °Please have the pharmacy call with any other refills you may need. ° °Please continue your efforts at being more active, low cholesterol diet, and weight control. ° °Please keep your appointments with your specialists as you may have planned ° ° ° °

## 2015-04-17 NOTE — Progress Notes (Signed)
Pre visit review using our clinic review tool, if applicable. No additional management support is needed unless otherwise documented below in the visit note. 

## 2015-04-18 ENCOUNTER — Ambulatory Visit (INDEPENDENT_AMBULATORY_CARE_PROVIDER_SITE_OTHER): Payer: Commercial Managed Care - HMO | Admitting: Pharmacist Clinician (PhC)/ Clinical Pharmacy Specialist

## 2015-04-18 DIAGNOSIS — Z7901 Long term (current) use of anticoagulants: Secondary | ICD-10-CM

## 2015-04-18 DIAGNOSIS — I480 Paroxysmal atrial fibrillation: Secondary | ICD-10-CM

## 2015-04-18 DIAGNOSIS — I4891 Unspecified atrial fibrillation: Secondary | ICD-10-CM

## 2015-04-18 LAB — POCT INR: INR: 2.3

## 2015-04-23 ENCOUNTER — Encounter: Payer: Self-pay | Admitting: Internal Medicine

## 2015-04-23 ENCOUNTER — Ambulatory Visit (INDEPENDENT_AMBULATORY_CARE_PROVIDER_SITE_OTHER): Payer: Commercial Managed Care - HMO | Admitting: Pharmacist Clinician (PhC)/ Clinical Pharmacy Specialist

## 2015-04-23 ENCOUNTER — Ambulatory Visit (INDEPENDENT_AMBULATORY_CARE_PROVIDER_SITE_OTHER): Payer: Commercial Managed Care - HMO | Admitting: Internal Medicine

## 2015-04-23 VITALS — BP 142/84 | HR 64 | Ht 66.0 in | Wt 179.2 lb

## 2015-04-23 DIAGNOSIS — E785 Hyperlipidemia, unspecified: Secondary | ICD-10-CM | POA: Diagnosis not present

## 2015-04-23 DIAGNOSIS — Z79899 Other long term (current) drug therapy: Secondary | ICD-10-CM

## 2015-04-23 DIAGNOSIS — I471 Supraventricular tachycardia: Secondary | ICD-10-CM

## 2015-04-23 DIAGNOSIS — I4891 Unspecified atrial fibrillation: Secondary | ICD-10-CM

## 2015-04-23 DIAGNOSIS — F411 Generalized anxiety disorder: Secondary | ICD-10-CM | POA: Diagnosis not present

## 2015-04-23 DIAGNOSIS — I480 Paroxysmal atrial fibrillation: Secondary | ICD-10-CM

## 2015-04-23 DIAGNOSIS — Z7901 Long term (current) use of anticoagulants: Secondary | ICD-10-CM

## 2015-04-23 LAB — POCT INR: INR: 2.8

## 2015-04-23 NOTE — Progress Notes (Signed)
OFFICE NOTE  Chief Complaint:  Routine follow-up, occasional palpitations - son died August 01, 2022  Primary Care Physician: Walker Kehr, MD  HPI:  [name]  is a 81 year old female with a history of paroxysmal atrial fibrillation and SVT in the past. This happened when she underwent a Myoview for chest pain in the hospital. She was placed on metoprolol succinate 25 b.i.d. and has done well with that with improvement in her palpitations. Her echo showed an EF greater than 55% with some mild aortic insufficiency and an aneurysmal atrial septum. She is on simvastatin as well as Coumadin managed by you. She's recently had some abnormal labs which showed borderline diabetes. She's not currently a medication for that. She reports occasional intermittent chest pain. She underwent a cardia catheterization in the past which showed mild to moderate nonobstructive coronary disease which is managed medically. She does also have bilateral venous insufficiency of the lower chin and knees and has worn compression stockings with improvement in her swelling and symptoms.  [name] returns today without any new complaints. She denies any shortness of breath or chest pain. She does get some occasional chest wall pain and is relieved with tramadol.  I saw [name] back in the office today. Overall she reports she is doing very well. She's had very few episodes of palpitations. She has had a decreased dose in her amlodipine to 5 mg daily, likely due to leg swelling. She is now taking Lasix as needed for edema. She's had a few episodes of racing heart for which she is to perform vagal maneuvers successfully. EKG shows sinus rhythm with PVCs and PACs but no evidence of atrial fibrillation in the office today. She is on warfarin anticoagulation. We will check the INR in the office today.  [name] returns today for follow-up. Unfortunately her son died this past 2022/08/01 of leukemia. She says he's  been in and out of the hospital for several months. She has had it small increase in palpitations recently but seems to be controlled with taking and a half dose of metoprolol. She denies any chest pain although has some pain behind her right shoulder blade. This is intermittent and has improved. Is not associated with exertion or relieved by rest. Her EKG today shows normal sinus rhythm without ischemia. Recent A1c was 6.8-she is not on diabetes medications. I do not see a recent lipid profile though she is on simvastatin 20 mg every OD.  PMHx:  Past Medical History  Diagnosis Date  . Bronchitis, not specified as acute or chronic   . Mitral valve disorders   . Essential hypertension   . Unspecified venous (peripheral) insufficiency   . Phlebitis and thrombophlebitis of superficial vessels of lower extremities   . Dyslipidemia   . Other abnormal glucose   . Diaphragmatic hernia without mention of obstruction or gangrene   . Irritable bowel syndrome   . Osteoarthrosis, unspecified whether generalized or localized, unspecified site   . Osteoporosis, unspecified   . Unspecified vitamin D deficiency   . Anxiety state, unspecified   . History of palpitations   . CAD (coronary artery disease)   . PAF (paroxysmal atrial fibrillation) ([place])   . History of nuclear stress test 07/2010    lexiscan - no perfusion defects     Past Surgical History  Procedure Laterality Date  . Vesicovaginal fistula closure w/ tah    . Cardiac catheterization  07/30/2010    nonobstructive CAD, 20-40% RCA stenosis, 50-60%  eccentric LAD stenosis more prox, 40% LAD stenosis more distal, EF 65%  . Transthoracic echocardiogram  09/03/2010    EF=>55%; mild MR; mod TR; AV mildly sclerotic with trace regurg; mild pulm valve regurg    FAMHx:  Family History  Problem Relation Age of Onset  . Stroke Mother   . Stroke Father     also heart disease  . Stroke Brother     also heart disease  . Clotting disorder Paternal  Grandmother     blood clot    SOCHx:   reports that she has never smoked. She has never used smokeless tobacco. She reports that she does not drink alcohol or use illicit drugs.  ALLERGIES:  Allergies  Allergen Reactions  . Clarithromycin     REACTION: unspecified  . Fosamax [Alendronate Sodium]     achy  . Guaifenesin Er     itching  . Metformin     REACTION: pt states INTOL to Metformin "felt drained"  . Tape     Adhesive tape---rash    ROS: A comprehensive review of systems was negative except for: Cardiovascular: positive for palpitations Musculoskeletal: positive for back pain  HOME MEDS: Current Outpatient Prescriptions  Medication Sig Dispense Refill  . ALPRAZolam (XANAX) 0.5 MG tablet take 1/2-1 tablet by mouth three times a day if needed 90 tablet 2  . amLODipine (NORVASC) 5 MG tablet take 1 tablet by mouth once daily 30 tablet 3  . aspirin 81 MG tablet Take 81 mg by mouth daily.      . diclofenac sodium (VOLTAREN) 1 % GEL Apply to left ankle four times per day per Dr. Samara Snide    . fluocinonide cream (LIDEX) 0.05 % Apply twice daily to rash as needed 30 g 5  . furosemide (LASIX) 20 MG tablet Take 1 tablet (20 mg total) by mouth daily as needed for edema. 30 tablet 3  . HYDROcodone-homatropine (HYCODAN) 5-1.5 MG/5ML syrup Take 5 mLs by mouth every 6 (six) hours as needed for cough. 180 mL 0  . hydroxypropyl methylcellulose (ISOPTO TEARS) 2.5 % ophthalmic solution Place 1 drop into both eyes 2 (two) times daily.      . hydrOXYzine (ATARAX/VISTARIL) 25 MG tablet Take 1 tablet (25 mg total) by mouth every 8 (eight) hours as needed for itching. 60 tablet 0  . isosorbide mononitrate (IMDUR) 30 MG 24 hr tablet take 1 tablet by mouth once daily 30 tablet 9  . levofloxacin (LEVAQUIN) 250 MG tablet Take 1 tablet (250 mg total) by mouth daily. 10 tablet 0  . loperamide (IMODIUM A-D) 2 MG tablet Take 1 tablet (2 mg total) by mouth 4 (four) times daily as needed for diarrhea or  loose stools. 30 tablet 0  . metoprolol succinate (TOPROL-XL) 25 MG 24 hr tablet take 1 tablet by mouth twice a day 60 tablet 3  . ONE TOUCH ULTRA TEST test strip TEST ONCE A DAY OR AS DIRECTED. 100 each 1  . pantoprazole (PROTONIX) 40 MG tablet take 1 tablet by mouth once daily 30 tablet 6  . simvastatin (ZOCOR) 20 MG tablet take 1 tablet by mouth at bedtime 90 tablet 3  . traMADol (ULTRAM) 50 MG tablet take 1 tablet by mouth three times a day if needed for pain *NOT TO EXCEED 3 TABLETS DAILY 90 tablet 1  . warfarin (COUMADIN) 5 MG tablet take 1 tablet by mouth once daily or as directed BY COUMADIN CLINIC 30 tablet 3  . Cholecalciferol (VITAMIN D3) 2000  UNITS capsule Take 1 capsule (2,000 Units total) by mouth daily. 100 capsule 3   No current facility-administered medications for this visit.    LABS/IMAGING: No results found for this or any previous visit (from the past 48 hour(s)). No results found.  VITALS: BP 142/84 mmHg  Pulse 64  Ht 5\' 6"  (1.676 m)  Wt 179 lb 3 oz (81.279 kg)  BMI 28.94 kg/m2  EXAM: General appearance: alert and no distress Neck: no carotid bruit and no JVD Lungs: clear to auscultation bilaterally Heart: regular rate and rhythm, S1, S2 normal, 2/6 SEM at apex, no click, rub or gallop Abdomen: soft, non-tender; bowel sounds normal; no masses,  no organomegaly and overweight Extremities: extremities normal, atraumatic, no cyanosis or edema Pulses: 2+ and symmetric Skin: Skin color, texture, turgor normal. No rashes or lesions Neurologic: Grossly normal Psych: Mood, affect normal  EKG: Normal sinus rhythm at 64  ASSESSMENT: 1. Paroxysmal atrial fibrillation on warfarin - CHADSVASC score of 4 2. Hypertension-controlled 3. Dyslipidemia-on statin 4. Overweight 5. Moderate, non-obstructive CAD by cath in 2012 6. PSVT - controlled by b-blocker and vagal maneuvers  PLAN: 1.   Ms. Macias is doing well overall with relatively infrequent occurrences of  paroxysmal tachyarrhythmias. Her INR has been well controlled. She's had very infrequent tachyarrhythmias which seem to be improved with vagal maneuvers. Overall she is doing well on blood pressures controlled today. She is understandably grieving today with the recent death of her son. I told her it is to be expected that she may have some more palpitations. Should she require taking extra doses of metoprolol more than 3 times a week, we may need to make any dose adjustment. She is overdue for a lipid profile and will go ahead and obtain that as well as a comprehensive metabolic profile. I will make any medication adjustments accordingly.  Plan to see her back annually or sooner as necessary.  Pixie Casino, MD, [facility] Attending Cardiologist [place] Meghna Hagmann 04/23/2015, 1:30 PM

## 2015-04-23 NOTE — Patient Instructions (Signed)
Your physician recommends that you return for lab work in: [place] [facility] AT [facility]  Your physician recommends that you schedule a follow-up appointment in: [place] DR. HILTY

## 2015-05-14 ENCOUNTER — Other Ambulatory Visit: Payer: Self-pay | Admitting: *Deleted

## 2015-05-14 DIAGNOSIS — E785 Hyperlipidemia, unspecified: Secondary | ICD-10-CM | POA: Diagnosis not present

## 2015-05-14 DIAGNOSIS — Z79899 Other long term (current) drug therapy: Secondary | ICD-10-CM | POA: Diagnosis not present

## 2015-05-14 MED ORDER — SIMVASTATIN 20 MG PO TABS: 20.0000 mg | ORAL_TABLET | Freq: Every day | ORAL | Status: DC

## 2015-05-15 ENCOUNTER — Ambulatory Visit: Payer: Commercial Managed Care - HMO | Admitting: Internal Medicine

## 2015-05-15 LAB — LIPID PANEL
CHOL/HDL RATIO: 2.4 ratio (ref <=5.0)
CHOLESTEROL: 189 mg/dL (ref 125–200)
HDL: 78 mg/dL (ref >=46)
LDL Cholesterol: 90 mg/dL (ref <130)
TRIGLYCERIDES: 107 mg/dL (ref <150)
VLDL: 21 mg/dL (ref <30)

## 2015-05-15 LAB — COMPREHENSIVE METABOLIC PANEL
ALBUMIN: 4 g/dL (ref 3.6–5.1)
ALT: 12 U/L (ref 6–29)
AST: 16 U/L (ref 10–35)
Alkaline Phosphatase: 60 U/L (ref 33–130)
BUN: 13 mg/dL (ref 7–25)
CALCIUM: 8.9 mg/dL (ref 8.6–10.4)
CHLORIDE: 103 mmol/L (ref 98–110)
CO2: 31 mmol/L (ref 20–31)
CREATININE: 0.97 mg/dL — AB (ref 0.60–0.93)
Glucose, Bld: 121 mg/dL — ABNORMAL HIGH (ref 65–99)
POTASSIUM: 4.3 mmol/L (ref 3.5–5.3)
SODIUM: 138 mmol/L (ref 135–146)
TOTAL PROTEIN: 6.7 g/dL (ref 6.1–8.1)
Total Bilirubin: 0.5 mg/dL (ref 0.2–1.2)

## 2015-05-21 ENCOUNTER — Other Ambulatory Visit: Payer: Self-pay | Admitting: *Deleted

## 2015-05-21 NOTE — Telephone Encounter (Signed)
OK to fill this prescription with additional refills x2 Thank you!  

## 2015-05-21 NOTE — Telephone Encounter (Signed)
Rf req for Tramadol 50 mg 1 po tid prn. Last filled 02/08/2015. Ok to Rf?

## 2015-05-22 ENCOUNTER — Ambulatory Visit (INDEPENDENT_AMBULATORY_CARE_PROVIDER_SITE_OTHER): Payer: Commercial Managed Care - HMO | Admitting: Pharmacist Clinician (PhC)/ Clinical Pharmacy Specialist

## 2015-05-22 DIAGNOSIS — Z7901 Long term (current) use of anticoagulants: Secondary | ICD-10-CM | POA: Diagnosis not present

## 2015-05-22 DIAGNOSIS — I480 Paroxysmal atrial fibrillation: Secondary | ICD-10-CM | POA: Diagnosis not present

## 2015-05-22 DIAGNOSIS — I4891 Unspecified atrial fibrillation: Secondary | ICD-10-CM

## 2015-05-22 LAB — POCT INR: INR: 1.9

## 2015-05-22 MED ORDER — TRAMADOL HCL 50 MG PO TABS: 50.0000 mg | ORAL_TABLET | Freq: Three times a day (TID) | ORAL | Status: DC

## 2015-05-22 NOTE — Telephone Encounter (Signed)
Printed for PCP to sign 

## 2015-05-27 ENCOUNTER — Other Ambulatory Visit: Payer: Self-pay | Admitting: Internal Medicine

## 2015-06-19 ENCOUNTER — Ambulatory Visit (INDEPENDENT_AMBULATORY_CARE_PROVIDER_SITE_OTHER): Payer: Commercial Managed Care - HMO | Admitting: Pharmacist Clinician (PhC)/ Clinical Pharmacy Specialist

## 2015-06-19 DIAGNOSIS — I480 Paroxysmal atrial fibrillation: Secondary | ICD-10-CM

## 2015-06-19 DIAGNOSIS — I4891 Unspecified atrial fibrillation: Secondary | ICD-10-CM

## 2015-06-19 DIAGNOSIS — Z7901 Long term (current) use of anticoagulants: Secondary | ICD-10-CM

## 2015-06-19 LAB — POCT INR: INR: 2.6

## 2015-07-05 ENCOUNTER — Telehealth: Payer: Self-pay | Admitting: Internal Medicine

## 2015-07-05 MED ORDER — GLUCOSE BLOOD VI STRP: ORAL_STRIP | Status: DC

## 2015-07-05 NOTE — Telephone Encounter (Signed)
Is requesting refills on One Touch Ultra Blue test strips to be sent to [facility] on [address].  States Pharmacy has been trying to get refilled for awhile.

## 2015-07-05 NOTE — Telephone Encounter (Signed)
Per chart rx was sent to [facility] on [address] this am, will resend to correct pharmacy,.../lmb

## 2015-07-05 NOTE — Addendum Note (Signed)
Addended by: Earnstine Regal on: 07/05/2015 02:51 PM   Modules accepted: Orders

## 2015-07-17 ENCOUNTER — Telehealth: Payer: Self-pay | Admitting: Internal Medicine

## 2015-07-17 ENCOUNTER — Ambulatory Visit (INDEPENDENT_AMBULATORY_CARE_PROVIDER_SITE_OTHER): Payer: Commercial Managed Care - HMO | Admitting: Pharmacist Clinician (PhC)/ Clinical Pharmacy Specialist

## 2015-07-17 DIAGNOSIS — I4891 Unspecified atrial fibrillation: Secondary | ICD-10-CM | POA: Diagnosis not present

## 2015-07-17 DIAGNOSIS — I480 Paroxysmal atrial fibrillation: Secondary | ICD-10-CM | POA: Diagnosis not present

## 2015-07-17 DIAGNOSIS — Z7901 Long term (current) use of anticoagulants: Secondary | ICD-10-CM

## 2015-07-17 LAB — POCT INR: INR: 2.5

## 2015-07-17 NOTE — Telephone Encounter (Signed)
Patient walked in. States that the insurance does not pay for one touch, but rather for accucheck. She is asking that we call in as script for accucheck meter, test strips and lancets.   She states that the one touch requires PA, where the accucheck does not .   Verified pharmacy is [id number], [place]. Please call the patient to advise

## 2015-07-18 MED ORDER — ACCU-CHEK FASTCLIX LANCETS MISC: Status: DC

## 2015-07-18 MED ORDER — ACCU-CHEK AVIVA PLUS W/DEVICE KIT: PACK | Status: DC

## 2015-07-18 MED ORDER — GLUCOSE BLOOD VI STRP: 1.0000 | ORAL_STRIP | Freq: Two times a day (BID) | Status: DC

## 2015-07-18 NOTE — Telephone Encounter (Signed)
Notified pt new Glucose monitor w/supplies sent to [facility]...[name]

## 2015-07-21 ENCOUNTER — Encounter: Payer: Self-pay | Admitting: Internal Medicine

## 2015-07-21 ENCOUNTER — Ambulatory Visit (INDEPENDENT_AMBULATORY_CARE_PROVIDER_SITE_OTHER): Payer: Commercial Managed Care - HMO | Admitting: Internal Medicine

## 2015-07-21 VITALS — BP 120/70 | HR 57 | Temp 98.0°F | Ht 66.0 in | Wt 181.0 lb

## 2015-07-21 DIAGNOSIS — A084 Viral intestinal infection, unspecified: Secondary | ICD-10-CM | POA: Diagnosis not present

## 2015-07-21 NOTE — Assessment & Plan Note (Signed)
Better now No dehydration  Black stool due to pepto---reassured No Rx indicated

## 2015-07-21 NOTE — Progress Notes (Signed)
Subjective:    Patient ID: [name], female    DOB: 1935-03-25, 81 y.o.   MRN: [id number]  HPI Here due to virus for about a week-- GI May be some better over the past 3 days Using pepto bismol  Loose stools --- 3-4 per day at first This has improved and looking more normal Stool is dark black though  Able to eat more regular food in past 1-2 days No nausea or vomiting No fever No abdominal pain  Current Outpatient Prescriptions on File Prior to Visit  Medication Sig Dispense Refill  . ACCU-CHEK FASTCLIX LANCETS MISC Use to check blood sugars twice a day Dx E11.9 102 each 2  . ALPRAZolam (XANAX) 0.5 MG tablet take 1/2-1 tablet by mouth three times a day if needed 90 tablet 2  . amLODipine (NORVASC) 5 MG tablet take 1 tablet by mouth once daily 30 tablet 3  . aspirin 81 MG tablet Take 81 mg by mouth daily.      . Blood Glucose Monitoring Suppl (ACCU-CHEK AVIVA PLUS) w/Device KIT Use to check blood sugars daily Dx E11.9 1 kit 0  . Cholecalciferol (VITAMIN D3) 2000 UNITS capsule Take 1 capsule (2,000 Units total) by mouth daily. 100 capsule 3  . diclofenac sodium (VOLTAREN) 1 % GEL Apply to left ankle four times per day per Dr. Samara Snide    . fluocinonide cream (LIDEX) 0.05 % Apply twice daily to rash as needed 30 g 5  . furosemide (LASIX) 20 MG tablet Take 1 tablet (20 mg total) by mouth daily as needed for edema. 30 tablet 3  . glucose blood (ACCU-CHEK AVIVA PLUS) test strip 1 each by Other route 2 (two) times daily. Use to check blood sugars twice a day Dx E11.9 100 each 2  . hydroxypropyl methylcellulose (ISOPTO TEARS) 2.5 % ophthalmic solution Place 1 drop into both eyes 2 (two) times daily.      . hydrOXYzine (ATARAX/VISTARIL) 25 MG tablet Take 1 tablet (25 mg total) by mouth every 8 (eight) hours as needed for itching. 60 tablet 0  . isosorbide mononitrate (IMDUR) 30 MG 24 hr tablet take 1 tablet by mouth once daily 30 tablet 9  . loperamide (IMODIUM A-D) 2 MG tablet  Take 1 tablet (2 mg total) by mouth 4 (four) times daily as needed for diarrhea or loose stools. 30 tablet 0  . metoprolol succinate (TOPROL-XL) 25 MG 24 hr tablet take 1 tablet by mouth twice a day 60 tablet 3  . pantoprazole (PROTONIX) 40 MG tablet take 1 tablet by mouth once daily 30 tablet 6  . simvastatin (ZOCOR) 20 MG tablet Take 1 tablet (20 mg total) by mouth at bedtime. 90 tablet 3  . traMADol (ULTRAM) 50 MG tablet Take 1 tablet (50 mg total) by mouth 3 (three) times daily. 90 tablet 2  . warfarin (COUMADIN) 5 MG tablet TAKE 1 TABLET BY MOUTH DAILY OR AS DIRECTED BY COUMADIN CLINIC 30 tablet 3   No current facility-administered medications on file prior to visit.    Allergies  Allergen Reactions  . Clarithromycin     REACTION: unspecified  . Fosamax [Alendronate Sodium]     achy  . Guaifenesin Er     itching  . Metformin     REACTION: pt states INTOL to Metformin "felt drained"  . Tape     Adhesive tape---rash    Past Medical History  Diagnosis Date  . Bronchitis, not specified as acute or chronic   .  Mitral valve disorders   . Essential hypertension   . Unspecified venous (peripheral) insufficiency   . Phlebitis and thrombophlebitis of superficial vessels of lower extremities   . Dyslipidemia   . Other abnormal glucose   . Diaphragmatic hernia without mention of obstruction or gangrene   . Irritable bowel syndrome   . Osteoarthrosis, unspecified whether generalized or localized, unspecified site   . Osteoporosis, unspecified   . Unspecified vitamin D deficiency   . Anxiety state, unspecified   . History of palpitations   . CAD (coronary artery disease)   . PAF (paroxysmal atrial fibrillation) ([place])   . History of nuclear stress test 07/2010    lexiscan - no perfusion defects     Past Surgical History  Procedure Laterality Date  . Vesicovaginal fistula closure w/ tah    . Cardiac catheterization  07/30/2010    nonobstructive CAD, 20-40% RCA stenosis, 50-60%  eccentric LAD stenosis more prox, 40% LAD stenosis more distal, EF 65%  . Transthoracic echocardiogram  09/03/2010    EF=>55%; mild MR; mod TR; AV mildly sclerotic with trace regurg; mild pulm valve regurg    Family History  Problem Relation Age of Onset  . Stroke Mother   . Stroke Father     also heart disease  . Stroke Brother     also heart disease  . Clotting disorder Paternal Grandmother     blood clot    Social History   Social History  . Marital Status: Widowed    Spouse Name: N/A  . Number of Children: 4  . Years of Education: N/A   Occupational History  . retired   .     Social History Main Topics  . Smoking status: Never Smoker   . Smokeless tobacco: Never Used  . Alcohol Use: No  . Drug Use: No  . Sexual Activity: Not on file   Other Topics Concern  . Not on file   Social History Narrative   Review of Systems Slight wooziness in head Hasn't used imodium with this illness No cough or SOB    Objective:   Physical Exam  Constitutional: She appears well-developed and well-nourished. No distress.  Neck: Normal range of motion. Neck supple.  Cardiovascular: Normal rate, regular rhythm and normal heart sounds.  Exam reveals no gallop.   No murmur heard. HR 60  Pulmonary/Chest: Effort normal and breath sounds normal. No respiratory distress. She has no wheezes. She has no rales.  Abdominal: Soft. Bowel sounds are normal. She exhibits no distension and no mass. There is no tenderness. There is no rebound and no guarding.  Genitourinary:  Rectal shows no masses or lesions Black heme negative stool  Lymphadenopathy:    She has no cervical adenopathy.          Assessment & Plan:

## 2015-07-21 NOTE — Progress Notes (Signed)
Pre visit review using our clinic review tool, if applicable. No additional management support is needed unless otherwise documented below in the visit note. 

## 2015-07-24 ENCOUNTER — Other Ambulatory Visit: Payer: Self-pay | Admitting: Internal Medicine

## 2015-07-24 NOTE — Telephone Encounter (Signed)
REFILL 

## 2015-07-28 ENCOUNTER — Other Ambulatory Visit: Payer: Self-pay | Admitting: Internal Medicine

## 2015-07-30 NOTE — Telephone Encounter (Signed)
Pantoprazole refilled #30 with 7 refills on 07/24/15

## 2015-07-31 ENCOUNTER — Other Ambulatory Visit: Payer: Self-pay | Admitting: Internal Medicine

## 2015-08-10 ENCOUNTER — Other Ambulatory Visit: Payer: Self-pay

## 2015-08-10 DIAGNOSIS — Z1231 Encounter for screening mammogram for malignant neoplasm of breast: Secondary | ICD-10-CM

## 2015-08-21 ENCOUNTER — Telehealth: Payer: Self-pay | Admitting: Internal Medicine

## 2015-08-21 NOTE — Telephone Encounter (Signed)
New message   Pt said last week she felt a flutter and she wants to speak to rn offered her 5/18 appt she declined   Patient c/o Palpitations:  High priority if patient c/o lightheadedness and shortness of breath.  1. How long have you been having palpitations? Week ago 2. Are you currently experiencing lightheadedness and shortness of breath?  no  3. Have you checked your BP and heart rate? (document readings) no 4. Are you experiencing any other symptoms? no

## 2015-08-22 NOTE — Telephone Encounter (Signed)
Thanks .[name] I'll follow-up on her FLEX appointment.  Dr. Lemmie Evens

## 2015-08-22 NOTE — Telephone Encounter (Signed)
Received call back from pt.  She said she could "feel a flutter" occasionally over the past weekend and few days. She said it happened once one night and then some yesterday. Denies CP, SOB, N/V, swelling or dizziness. Pt had not taken her BP during the past several days and did not have her cuff nearby at this time. I tried to verify the pt's medications but she was unable to go over each one and she simply said she was "taking them like they told me too."  She said she has not felt the flutters since yesterday. Pt made an appt to see Flex at [facility] [address] tomorrow at 11:30.  Encouraged pt to have someone else drive her to help her find the office. Pt seemed subdued and understood but was very slow in her thinking. Encouraged pt to call her daughter-in-law who she said could bring her as soon as we got off the phone to let her know about her appt. Pt verbalized understanding to go to the emergency room or call 911 if she experiences CP, SOB, nausea, vomiting, sweating, or if the palpitations worsen.    Will route to Dr Debara Pickett and Sheral Apley for further review.

## 2015-08-22 NOTE — Telephone Encounter (Signed)
No answer. Left message to call back.   

## 2015-08-23 ENCOUNTER — Ambulatory Visit (INDEPENDENT_AMBULATORY_CARE_PROVIDER_SITE_OTHER): Payer: Commercial Managed Care - HMO | Admitting: Nurse Practitioner

## 2015-08-23 ENCOUNTER — Encounter: Payer: Self-pay | Admitting: Nurse Practitioner

## 2015-08-23 VITALS — BP 130/78 | HR 69 | Ht 66.0 in | Wt 183.8 lb

## 2015-08-23 DIAGNOSIS — I471 Supraventricular tachycardia: Secondary | ICD-10-CM

## 2015-08-23 DIAGNOSIS — I480 Paroxysmal atrial fibrillation: Secondary | ICD-10-CM

## 2015-08-23 DIAGNOSIS — E785 Hyperlipidemia, unspecified: Secondary | ICD-10-CM | POA: Diagnosis not present

## 2015-08-23 DIAGNOSIS — I10 Essential (primary) hypertension: Secondary | ICD-10-CM

## 2015-08-23 DIAGNOSIS — R002 Palpitations: Secondary | ICD-10-CM | POA: Diagnosis not present

## 2015-08-23 LAB — BASIC METABOLIC PANEL
BUN: 12 mg/dL (ref 7–25)
CHLORIDE: 103 mmol/L (ref 98–110)
CO2: 29 mmol/L (ref 20–31)
Calcium: 9.1 mg/dL (ref 8.6–10.4)
Creat: 0.98 mg/dL — ABNORMAL HIGH (ref 0.60–0.93)
Glucose, Bld: 86 mg/dL (ref 65–99)
POTASSIUM: 4.2 mmol/L (ref 3.5–5.3)
SODIUM: 139 mmol/L (ref 135–146)

## 2015-08-23 LAB — MAGNESIUM: MAGNESIUM: 2.1 mg/dL (ref 1.5–2.5)

## 2015-08-23 LAB — TSH: TSH: 1.36 mIU/L

## 2015-08-23 MED ORDER — METOPROLOL SUCCINATE ER 25 MG PO TB24: 37.5000 mg | ORAL_TABLET | Freq: Two times a day (BID) | ORAL | Status: DC

## 2015-08-23 NOTE — Progress Notes (Signed)
Office Visit    Patient Name: [name] Date of Encounter: 08/23/2015  Primary Care Provider:  Walker Kehr, MD Primary Cardiologist:  C. Hilty, MD   Chief Complaint    81 year old female with a history of palpitations, PSVT, and paroxysmal atrial fibrillation, who presents for follow-up secondary to increased frequency of palpitations.  Past Medical History    Past Medical History  Diagnosis Date  . Bronchitis, not specified as acute or chronic   . Mitral valve disorders     a. 08/2010 Echo: EF >55%, mild MR, mod TR, trace AI.  [name] Essential hypertension   . Unspecified venous (peripheral) insufficiency   . Phlebitis and thrombophlebitis of superficial vessels of lower extremities   . Dyslipidemia   . Other abnormal glucose   . Diaphragmatic hernia without mention of obstruction or gangrene   . Irritable bowel syndrome   . Osteoarthrosis, unspecified whether generalized or localized, unspecified site   . Osteoporosis, unspecified   . Unspecified vitamin D deficiency   . Anxiety state, unspecified   . PSVT (paroxysmal supraventricular tachycardia) ([place])     a. 05/2012 Holter: short bursts of PSVT noted-->Managed with toprol.  . Non-obstructive CAD     a. 07/2010 Lexiscan MV: no ischemia, EF 78%; b. 07/2010 Cath: LM nl, LAD50/60p, 40 apical, LCX nl, RCA dominant, 30/40p, 31m  .Marland KitchenPAF (paroxysmal atrial fibrillation) ([facility])     a. CHA2DS2VASc = 4-->coumadin.   Past Surgical History  Procedure Laterality Date  . Vesicovaginal fistula closure w/ tah    . Cardiac catheterization  07/30/2010    nonobstructive CAD, 20-40% RCA stenosis, 50-60% eccentric LAD stenosis more prox, 40% LAD stenosis more distal, EF 65%  . Transthoracic echocardiogram  09/03/2010    EF=>55%; mild MR; mod TR; AV mildly sclerotic with trace regurg; mild pulm valve regurg    Allergies  Allergies  Allergen Reactions  . Clarithromycin     REACTION: unspecified  . Fosamax [Alendronate Sodium]    achy  . Guaifenesin Er     itching  . Metformin     REACTION: pt states INTOL to Metformin "felt drained"  . Tape     Adhesive tape---rash    History of Present Illness    81year old female with the above complex past medical history. She has a history of paroxysmal supraventricular tachycardia dating back to 2012, at which time she was noted to have SVT during a Lexiscan Myoview.  Though there was no ischemia on the study, she subsequently underwent catheterization revealing moderate nonobstructive CAD. She wore a Holter monitor in 2014 which again showed short bursts PSVTand she has been well managed on beta blocker therapy. Over the years, she has had intermittent palpitations, for which she will take an additional half of a metoprolol from time to time. She rarely has to do this but did have to take an extra half of metoprolol one day last month.  Over the past week, she has noted more frequent tachypalpitations, occurring a few times per day, lasting up to about a minute, and resolving spontaneously. There are no associated symptoms. Symptoms are most likely to occur during periods of rest or relaxation. She has not been taking any additional metoprolol. She denies chest pain, smear, PND, orthopnea, dizziness, syncope, edema, or early satiety.  Home Medications    Prior to Admission medications   Medication Sig Start Date End Date Taking? Authorizing Provider  ACCU-CHEK FASTCLIX LANCETS MISC Use to check blood sugars twice a  day Dx E11.9 07/18/15  Yes Aleksei Plotnikov V, MD  ALPRAZolam Duanne Moron) 0.5 MG tablet take 1/2-1 tablet by mouth three times a day if needed 02/08/15  Yes Aleksei Plotnikov V, MD  amLODipine (NORVASC) 5 MG tablet take 1 tablet by mouth once daily 07/31/15  Yes Hoyt Koch, MD  aspirin 81 MG tablet Take 81 mg by mouth daily.     Yes Historical Provider, MD  Blood Glucose Monitoring Suppl (ACCU-CHEK AVIVA PLUS) w/Device KIT Use to check blood sugars daily Dx E11.9  07/18/15  Yes Aleksei Plotnikov V, MD  Cholecalciferol (VITAMIN D3) 2000 UNITS capsule Take 1 capsule (2,000 Units total) by mouth daily. 02/08/15  Yes Aleksei Plotnikov V, MD  diclofenac sodium (VOLTAREN) 1 % GEL Apply 2 g topically 4 (four) times daily. Apply to left ankle four times per day per Dr. Samara Snide   Yes Historical Provider, MD  fluocinonide cream (LIDEX) 0.05 % Apply twice daily to rash as needed 07/05/12  Yes Noralee Space, MD  furosemide (LASIX) 20 MG tablet Take 1 tablet (20 mg total) by mouth daily as needed for edema. 11/08/14  Yes Aleksei Plotnikov V, MD  glucose blood (ACCU-CHEK AVIVA PLUS) test strip 1 each by Other route 2 (two) times daily. Use to check blood sugars twice a day Dx E11.9 07/18/15  Yes Aleksei Plotnikov V, MD  hydroxypropyl methylcellulose (ISOPTO TEARS) 2.5 % ophthalmic solution Place 1 drop into both eyes 2 (two) times daily.     Yes Historical Provider, MD  hydrOXYzine (ATARAX/VISTARIL) 25 MG tablet Take 1 tablet (25 mg total) by mouth every 8 (eight) hours as needed for itching. 10/11/14  Yes Aleksei Plotnikov V, MD  isosorbide mononitrate (IMDUR) 30 MG 24 hr tablet take 1 tablet by mouth once daily 10/30/14  Yes Pixie Casino, MD  loperamide (IMODIUM A-D) 2 MG tablet Take 1 tablet (2 mg total) by mouth 4 (four) times daily as needed for diarrhea or loose stools. 05/01/14  Yes Aleksei Plotnikov V, MD  metoprolol succinate (TOPROL-XL) 25 MG 24 hr tablet Take 1.5 tablets (37.5 mg total) by mouth 2 (two) times daily. 08/23/15  Yes Rogelia Mire, NP  pantoprazole (PROTONIX) 40 MG tablet take 1 tablet by mouth once daily 07/24/15  Yes Pixie Casino, MD  simvastatin (ZOCOR) 20 MG tablet Take 1 tablet (20 mg total) by mouth at bedtime. 05/14/15  Yes Aleksei Plotnikov V, MD  traMADol (ULTRAM) 50 MG tablet Take 1 tablet (50 mg total) by mouth 3 (three) times daily. 05/22/15  Yes Aleksei Plotnikov V, MD  warfarin (COUMADIN) 5 MG tablet TAKE 1 TABLET BY MOUTH DAILY OR AS  DIRECTED BY COUMADIN CLINIC 05/28/15  Yes Pixie Casino, MD    Review of Systems    As above, more frequent tachycardia palpitations over the past week.  She denies chest pain, palpitations, dyspnea, pnd, orthopnea, n, v, dizziness, syncope, edema, weight gain, or early satiety.  All other systems reviewed and are otherwise negative except as noted above.  Physical Exam    VS:  BP 130/78 mmHg  Pulse 69  Ht '5\' 6"'$  (1.676 m)  Wt 183 lb 12.8 oz (83.371 kg)  BMI 29.68 kg/m2 , BMI Body mass index is 29.68 kg/(m^2). GEN: Well nourished, well developed, in no acute distress. HEENT: normal. Neck: Supple, no JVD, carotid bruits, or masses. Cardiac: RRR, no murmurs, rubs, or gallops. No clubbing, cyanosis, edema.  Radials/DP/PT 2+ and equal bilaterally.  Respiratory:  Respirations regular  and unlabored, clear to auscultation bilaterally. GI: Soft, nontender, nondistended, BS + x 4. MS: no deformity or atrophy. Skin: warm and dry, no rash. Neuro:  Strength and sensation are intact. Psych: Normal affect.  Accessory Clinical Findings    ECG - regular sinus rhythm, 69, left axis deviation, left anterior fascicular block, LVH, no acute ST or T changes.  Assessment & Plan    1.  Palpitations/paroxysmal supraventricular tachycardia: Patient has a history of PSVT and also palpitations dating back several years. These have typically been controlled with Toprol-XL 25 mg twice a day and on occasion, she has required an extra half of the Toprol. Over the past week, she has had increased frequency of tachycardia palpitations without associated symptoms, lasting seconds to minutes and resolving spontaneously. She has not taken any additional metoprolol for episodes. I will check a basic metabolic panel, magnesium, and TSH today and have asked her to increase her Toprol-XL to 37.5 mg twice a day. She has a long history of palpitations and known PSVT, I will not place a monitor at this time but if symptoms  persist despite higher beta blocker dose, or duration increases, re-placement of monitor may be necessary.  2. Essential hypertension: Blood pressure is stable.  3. Paroxysmal atrial fibrillation: She has not been having any irregular palpitations. She is anticoagulated on Coumadin. Beta blocker as above.  4. Hyperlipidemia: She is on simvastatin therapy with an LDL of 90 in January 2017, and normal LFTs at that time.  5. Disposition: Follow-up labs today. I can see her back in clinic in 2-3 weeks.  Murray Hodgkins, NP 08/23/2015, 12:37 PM

## 2015-08-23 NOTE — Patient Instructions (Addendum)
Medication Instructions:  Your physician has recommended you make the following change in your medication:  1.  INCREASE the Metoprolol 25 mg taking 1 1/2 tablet twice a day   Labwork: TODAY:  MAG, TSH, & BMET  Testing/Procedures: None ordered  Follow-Up: Your physician recommends that you schedule a follow-up appointment in: 2 WEEKS WITH NP/PA @ [facility] OFFICE  Any Other Special Instructions Will Be Listed Below (If Applicable).   If you need a refill on your cardiac medications before your next appointment, please call your pharmacy.

## 2015-08-28 ENCOUNTER — Ambulatory Visit (INDEPENDENT_AMBULATORY_CARE_PROVIDER_SITE_OTHER): Payer: Commercial Managed Care - HMO | Admitting: Pharmacist

## 2015-08-28 DIAGNOSIS — I4891 Unspecified atrial fibrillation: Secondary | ICD-10-CM

## 2015-08-28 DIAGNOSIS — Z7901 Long term (current) use of anticoagulants: Secondary | ICD-10-CM | POA: Diagnosis not present

## 2015-08-28 DIAGNOSIS — I480 Paroxysmal atrial fibrillation: Secondary | ICD-10-CM

## 2015-08-28 LAB — POCT INR: INR: 2.6

## 2015-09-04 ENCOUNTER — Telehealth: Payer: Self-pay | Admitting: Internal Medicine

## 2015-09-04 NOTE — Telephone Encounter (Signed)
PA for metoprolol succinate 25mg  tablets - take 1.5 tablets (37.5mg ) BID submitted via [email or web address]  The request has received a Pending outcome. You will receive a final determination electronically in CoverMyMeds and via email and fax within 24 to 72 hours.

## 2015-09-05 ENCOUNTER — Telehealth: Payer: Self-pay | Admitting: Internal Medicine

## 2015-09-05 ENCOUNTER — Ambulatory Visit
Admission: RE | Admit: 2015-09-05 | Discharge: 2015-09-05 | Disposition: A | Discharge status: Home / Self Care | Payer: Commercial Managed Care - HMO | Source: Ambulatory Visit

## 2015-09-05 DIAGNOSIS — Z1231 Encounter for screening mammogram for malignant neoplasm of breast: Secondary | ICD-10-CM

## 2015-09-05 MED ORDER — METOPROLOL SUCCINATE ER 25 MG PO TB24: 37.5000 mg | ORAL_TABLET | Freq: Two times a day (BID) | ORAL | Status: DC

## 2015-09-05 NOTE — Telephone Encounter (Signed)
Pt calling to see if she can get an rx of Metoprolol to [facility] until her insurance will agree to cover the increased dosage-pls call pt @ [phone] or [phone]

## 2015-09-05 NOTE — Telephone Encounter (Signed)
Rx(s) sent to pharmacy electronically. Patient called and notified Rx was sent in and a PA was done on 5/24.  Advised that she call us if her med is not approved.  She also requested that her 5/26 0900 be moved to later time - rescheduled for 5/26 @ 10am

## 2015-09-07 ENCOUNTER — Ambulatory Visit: Payer: Commercial Managed Care - HMO | Admitting: Physician Assistant

## 2015-09-07 ENCOUNTER — Encounter: Payer: Self-pay | Admitting: Physician Assistant

## 2015-09-07 ENCOUNTER — Ambulatory Visit (INDEPENDENT_AMBULATORY_CARE_PROVIDER_SITE_OTHER): Payer: Commercial Managed Care - HMO | Admitting: Physician Assistant

## 2015-09-07 VITALS — BP 120/64 | HR 64 | Ht 66.0 in | Wt 182.0 lb

## 2015-09-07 DIAGNOSIS — I2583 Coronary atherosclerosis due to lipid rich plaque: Secondary | ICD-10-CM

## 2015-09-07 DIAGNOSIS — Z7901 Long term (current) use of anticoagulants: Secondary | ICD-10-CM | POA: Diagnosis not present

## 2015-09-07 DIAGNOSIS — I480 Paroxysmal atrial fibrillation: Secondary | ICD-10-CM | POA: Diagnosis not present

## 2015-09-07 DIAGNOSIS — I2510 Atherosclerotic heart disease of native coronary artery without angina pectoris: Secondary | ICD-10-CM | POA: Diagnosis not present

## 2015-09-07 DIAGNOSIS — I471 Supraventricular tachycardia: Secondary | ICD-10-CM

## 2015-09-07 NOTE — Telephone Encounter (Signed)
Medication approved until 09/03/2016

## 2015-09-07 NOTE — Patient Instructions (Signed)
Your physician recommends that you schedule a follow-up appointment in January 2018 with Dr. Theresa Wiggins will receive a letter in the mail a few months prior to when your appointment is due When you get this letter, please call our office to arrange an appointment   Your physician recommends that you continue on your current medications as directed. Please refer to the Current Medication list given to you today.

## 2015-09-07 NOTE — Progress Notes (Signed)
Patient ID: [name], female   DOB: Oct 01, 1934, 81 y.o.   MRN: [id number]    Date:  09/07/2015   ID:  [name], DOB [birth year], MRN [id number]  PCP:  Walker Kehr, MD  Primary Cardiologist:  [facility]  Chief Complaint  Patient presents with  . Follow-up    palpitations in the AM for short period,      History of Present Illness: [name] is a 81 y.o. female  with a history of Paroxysmal atrial fibrillation on Coumadin, paroxysmal supraventricular tachycardia dating back to 2012, at which time she was noted to have SVT during a Lexiscan Myoview. Though there was no ischemia on the study, she subsequently underwent catheterization revealing moderate nonobstructive CAD. She wore a Holter monitor in 2014 which again showed short bursts PSVTand she has been well managed on beta blocker therapy. Over the years, she has had intermittent palpitations, for which she will take an additional half of a metoprolol from time to time. She rarely has to do this but did have to take an extra half of metoprolol one day last month. Over the past week, she has noted more frequent tachypalpitations, occurring a few times per day, lasting up to about a minute, and resolving spontaneously. There are no associated symptoms. Symptoms are most likely to occur during periods of rest or relaxation. She has not been taking any additional metoprolol.   She was seen by Murray Hodgkins NP on the 11th at that time he increased her Toprol to 37.5 mg twice a day and discussed potential monitoring if symptoms did not decrease. She presents today for her two-week follow-up. She reports overall decrease in frequency of palpitations. It happens maybe 3-4 times per week and usually in the morning. She denies any dizziness, chest pain, shortness of breath.  Both her son and her son-in-law passed away in the last couple of months. Because of this she is decreased amount of exercise that she been doing.  She does  maintain some mild lower extremity edema.  The patient currently denies nausea, vomiting, fever, chest pain, shortness of breath, orthopnea, dizziness, PND, cough, congestion, abdominal pain, hematochezia, melena, claudication.  Wt Readings from Last 3 Encounters:  09/07/15 182 lb (82.555 kg)  08/23/15 183 lb 12.8 oz (83.371 kg)  07/21/15 181 lb (82.101 kg)     Past Medical History  Diagnosis Date  . Bronchitis, not specified as acute or chronic   . Mitral valve disorders     a. 08/2010 Echo: EF >55%, mild MR, mod TR, trace AI.  [name] Essential hypertension   . Unspecified venous (peripheral) insufficiency   . Phlebitis and thrombophlebitis of superficial vessels of lower extremities   . Dyslipidemia   . Other abnormal glucose   . Diaphragmatic hernia without mention of obstruction or gangrene   . Irritable bowel syndrome   . Osteoarthrosis, unspecified whether generalized or localized, unspecified site   . Osteoporosis, unspecified   . Unspecified vitamin D deficiency   . Anxiety state, unspecified   . PSVT (paroxysmal supraventricular tachycardia) ([place])     a. 05/2012 Holter: short bursts of PSVT noted-->Managed with toprol.  . Non-obstructive CAD     a. 07/2010 Lexiscan MV: no ischemia, EF 78%; b. 07/2010 Cath: LM nl, LAD50/60p, 40 apical, LCX nl, RCA dominant, 30/40p, 24m  .Marland KitchenPAF (paroxysmal atrial fibrillation) (HCC)     a. CHA2DS2VASc = 4-->coumadin.    Current Outpatient Prescriptions  Medication Sig Dispense Refill  . ACCU-CHEK  FASTCLIX LANCETS MISC Use to check blood sugars twice a day Dx E11.9 102 each 2  . ALPRAZolam (XANAX) 0.5 MG tablet take 1/2-1 tablet by mouth three times a day if needed 90 tablet 2  . amLODipine (NORVASC) 5 MG tablet take 1 tablet by mouth once daily 30 tablet 3  . aspirin 81 MG tablet Take 81 mg by mouth daily.      . Blood Glucose Monitoring Suppl (ACCU-CHEK AVIVA PLUS) w/Device KIT Use to check blood sugars daily Dx E11.9 1 kit 0  .  Cholecalciferol (VITAMIN D3) 2000 UNITS capsule Take 1 capsule (2,000 Units total) by mouth daily. 100 capsule 3  . diclofenac sodium (VOLTAREN) 1 % GEL Apply 2 g topically 4 (four) times daily. Apply to left ankle four times per day per Dr. Samara Snide    . fluocinonide cream (LIDEX) 0.05 % Apply twice daily to rash as needed 30 g 5  . furosemide (LASIX) 20 MG tablet Take 1 tablet (20 mg total) by mouth daily as needed for edema. 30 tablet 3  . glucose blood (ACCU-CHEK AVIVA PLUS) test strip 1 each by Other route 2 (two) times daily. Use to check blood sugars twice a day Dx E11.9 100 each 2  . hydroxypropyl methylcellulose (ISOPTO TEARS) 2.5 % ophthalmic solution Place 1 drop into both eyes 2 (two) times daily.      . hydrOXYzine (ATARAX/VISTARIL) 25 MG tablet Take 1 tablet (25 mg total) by mouth every 8 (eight) hours as needed for itching. 60 tablet 0  . isosorbide mononitrate (IMDUR) 30 MG 24 hr tablet take 1 tablet by mouth once daily 30 tablet 9  . loperamide (IMODIUM A-D) 2 MG tablet Take 1 tablet (2 mg total) by mouth 4 (four) times daily as needed for diarrhea or loose stools. 30 tablet 0  . metoprolol succinate (TOPROL-XL) 25 MG 24 hr tablet Take 1.5 tablets (37.5 mg total) by mouth 2 (two) times daily. 270 tablet 3  . pantoprazole (PROTONIX) 40 MG tablet take 1 tablet by mouth once daily 30 tablet 7  . simvastatin (ZOCOR) 20 MG tablet Take 1 tablet (20 mg total) by mouth at bedtime. 90 tablet 3  . traMADol (ULTRAM) 50 MG tablet Take 1 tablet (50 mg total) by mouth 3 (three) times daily. 90 tablet 2  . warfarin (COUMADIN) 5 MG tablet TAKE 1 TABLET BY MOUTH DAILY OR AS DIRECTED BY COUMADIN CLINIC 30 tablet 3   No current facility-administered medications for this visit.    Allergies:    Allergies  Allergen Reactions  . Clarithromycin     REACTION: unspecified  . Fosamax [Alendronate Sodium]     achy  . Guaifenesin Er     itching  . Metformin     REACTION: pt states INTOL to Metformin  "felt drained"  . Tape     Adhesive tape---rash    Social History:  The patient  reports that she has never smoked. She has never used smokeless tobacco. She reports that she does not drink alcohol or use illicit drugs.   Family history:   Family History  Problem Relation Age of Onset  . Stroke Mother   . Stroke Father     also heart disease  . Stroke Brother     also heart disease  . Clotting disorder Paternal Grandmother     blood clot    ROS:  Please see the history of present illness.  All other systems reviewed and negative.   PHYSICAL  EXAM: VS:  BP 120/64 mmHg  Pulse 64  Ht '5\' 6"'$  (1.676 m)  Wt 182 lb (82.555 kg)  BMI 29.39 kg/m2 Overweight,  well developed, in no acute distress HEENT: Pupils are equal round react to light accommodation extraocular movements are intact.  Neck: no JVDNo cervical lymphadenopathy. Cardiac: Regular rate and rhythm without murmurs rubs or gallops. Lungs:  clear to auscultation bilaterally, no wheezing, rhonchi or rales Abd: soft, nontender, positive bowel sounds all quadrants, no hepatosplenomegaly Ext: no lower extremity edema.  2+ radial and dorsalis pedis pulses. Skin: warm and dry Neuro:  Grossly normal     ASSESSMENT AND PLAN:  Problem List Items Addressed This Visit    PSVT (paroxysmal supraventricular tachycardia) (HCC)   PAF (paroxysmal atrial fibrillation) ([place])   Long term (current) use of anticoagulants - Primary   CAD (coronary artery disease)     Ms.  Campoy Reports decreased frequency of her palpitations since she was started on the increased Toprol dosing which is 37.5 milligrams twice daily. They're now having 3-4 times per per week and a.m. She is not symptomatic. She has not required additional Toprol.  Rhythm is regular exam. Blood pressures well-controlled on Norvasc 5 mg, Imdur 30 mg. She continues to take Zocor 20 mg for her cholesterol and Coumadin for paroxysmal atrial fibrillation. Follow-up in 6 months.  For  palpitations become more frequently where she symptomatic we should consider doing two week monitor.

## 2015-09-14 ENCOUNTER — Ambulatory Visit (INDEPENDENT_AMBULATORY_CARE_PROVIDER_SITE_OTHER): Payer: Commercial Managed Care - HMO | Admitting: Internal Medicine

## 2015-09-14 ENCOUNTER — Encounter: Payer: Self-pay | Admitting: Internal Medicine

## 2015-09-14 VITALS — BP 148/70 | HR 74 | Ht 66.0 in | Wt 182.0 lb

## 2015-09-14 DIAGNOSIS — E119 Type 2 diabetes mellitus without complications: Secondary | ICD-10-CM | POA: Diagnosis not present

## 2015-09-14 DIAGNOSIS — I480 Paroxysmal atrial fibrillation: Secondary | ICD-10-CM

## 2015-09-14 DIAGNOSIS — E785 Hyperlipidemia, unspecified: Secondary | ICD-10-CM | POA: Diagnosis not present

## 2015-09-14 DIAGNOSIS — Z634 Disappearance and death of family member: Secondary | ICD-10-CM

## 2015-09-14 DIAGNOSIS — F411 Generalized anxiety disorder: Secondary | ICD-10-CM

## 2015-09-14 DIAGNOSIS — I10 Essential (primary) hypertension: Secondary | ICD-10-CM

## 2015-09-14 DIAGNOSIS — Z0000 Encounter for general adult medical examination without abnormal findings: Secondary | ICD-10-CM | POA: Diagnosis not present

## 2015-09-14 DIAGNOSIS — F4321 Adjustment disorder with depressed mood: Secondary | ICD-10-CM

## 2015-09-14 MED ORDER — ALPRAZOLAM 0.5 MG PO TABS: ORAL_TABLET | ORAL | Status: DC

## 2015-09-14 MED ORDER — TRAMADOL HCL 50 MG PO TABS: 50.0000 mg | ORAL_TABLET | Freq: Three times a day (TID) | ORAL | Status: DC

## 2015-09-14 MED ORDER — WARFARIN SODIUM 5 MG PO TABS: ORAL_TABLET | ORAL | Status: DC

## 2015-09-14 NOTE — Assessment & Plan Note (Signed)
Son died in March 15, 2015 of cancer. Son in law died too Discussed

## 2015-09-14 NOTE — Assessment & Plan Note (Signed)
On Coumadin, Toprol XL 

## 2015-09-14 NOTE — Progress Notes (Signed)
Pre visit review using our clinic review tool, if applicable. No additional management support is needed unless otherwise documented below in the visit note. 

## 2015-09-14 NOTE — Assessment & Plan Note (Addendum)
Toprol was increased to 1.5 tab bid

## 2015-09-14 NOTE — Progress Notes (Signed)
Subjective:  Patient ID: [name], female    DOB: [birth year]  Age: 81 y.o. MRN: [id number]  CC: No chief complaint on file.   HPI [name] presents for HTN, OA, dyslipidemia f/u. Son died in 2015-03-07 of cancer.Son in law died too. C/o stress/grieving Well exam  Outpatient Prescriptions Prior to Visit  Medication Sig Dispense Refill  . ACCU-CHEK FASTCLIX LANCETS MISC Use to check blood sugars twice a day Dx E11.9 102 each 2  . ALPRAZolam (XANAX) 0.5 MG tablet take 1/2-1 tablet by mouth three times a day if needed 90 tablet 2  . amLODipine (NORVASC) 5 MG tablet take 1 tablet by mouth once daily 30 tablet 3  . aspirin 81 MG tablet Take 81 mg by mouth daily.      . Blood Glucose Monitoring Suppl (ACCU-CHEK AVIVA PLUS) w/Device KIT Use to check blood sugars daily Dx E11.9 1 kit 0  . Cholecalciferol (VITAMIN D3) 2000 UNITS capsule Take 1 capsule (2,000 Units total) by mouth daily. 100 capsule 3  . diclofenac sodium (VOLTAREN) 1 % GEL Apply 2 g topically 4 (four) times daily. Apply to left ankle four times per day per Dr. Samara Snide    . fluocinonide cream (LIDEX) 0.05 % Apply twice daily to rash as needed 30 g 5  . furosemide (LASIX) 20 MG tablet Take 1 tablet (20 mg total) by mouth daily as needed for edema. 30 tablet 3  . glucose blood (ACCU-CHEK AVIVA PLUS) test strip 1 each by Other route 2 (two) times daily. Use to check blood sugars twice a day Dx E11.9 100 each 2  . hydroxypropyl methylcellulose (ISOPTO TEARS) 2.5 % ophthalmic solution Place 1 drop into both eyes 2 (two) times daily.      . hydrOXYzine (ATARAX/VISTARIL) 25 MG tablet Take 1 tablet (25 mg total) by mouth every 8 (eight) hours as needed for itching. 60 tablet 0  . isosorbide mononitrate (IMDUR) 30 MG 24 hr tablet take 1 tablet by mouth once daily 30 tablet 9  . loperamide (IMODIUM A-D) 2 MG tablet Take 1 tablet (2 mg total) by mouth 4 (four) times daily as needed for diarrhea or loose stools. 30 tablet 0  .  metoprolol succinate (TOPROL-XL) 25 MG 24 hr tablet Take 1.5 tablets (37.5 mg total) by mouth 2 (two) times daily. 270 tablet 3  . pantoprazole (PROTONIX) 40 MG tablet take 1 tablet by mouth once daily 30 tablet 7  . simvastatin (ZOCOR) 20 MG tablet Take 1 tablet (20 mg total) by mouth at bedtime. 90 tablet 3  . traMADol (ULTRAM) 50 MG tablet Take 1 tablet (50 mg total) by mouth 3 (three) times daily. 90 tablet 2  . warfarin (COUMADIN) 5 MG tablet TAKE 1 TABLET BY MOUTH DAILY OR AS DIRECTED BY COUMADIN CLINIC 30 tablet 3   No facility-administered medications prior to visit.    ROS Review of Systems  Constitutional: Negative for chills, activity change, appetite change, fatigue and unexpected weight change.  HENT: Negative for congestion, mouth sores and sinus pressure.   Eyes: Negative for visual disturbance.  Respiratory: Negative for cough and chest tightness.   Gastrointestinal: Negative for nausea and abdominal pain.  Genitourinary: Negative for frequency, difficulty urinating and vaginal pain.  Musculoskeletal: Positive for arthralgias. Negative for back pain and gait problem.  Skin: Negative for pallor and rash.  Neurological: Negative for dizziness, tremors, weakness, numbness and headaches.  Psychiatric/Behavioral: Negative for suicidal ideas, confusion and sleep disturbance. The  patient is nervous/anxious.     Objective:  BP 148/70 mmHg  Pulse 74  Ht _0  (1.676 m)  Wt 182 lb (82.555 kg)  BMI 29.39 kg/m2  SpO2 96%  BP Readings from Last 3 Encounters:  09/14/15 148/70  09/07/15 120/64  08/23/15 130/78    Wt Readings from Last 3 Encounters:  09/14/15 182 lb (82.555 kg)  09/07/15 182 lb (82.555 kg)  08/23/15 183 lb 12.8 oz (83.371 kg)    Physical Exam  Constitutional: She appears well-developed. No distress.  HENT:  Head: Normocephalic.  Right Ear: External ear normal.  Left Ear: External ear normal.  Nose: Nose normal.  Mouth/Throat: Oropharynx is clear and  moist.  Eyes: Conjunctivae are normal. Pupils are equal, round, and reactive to light. Right eye exhibits no discharge. Left eye exhibits no discharge.  Neck: Normal range of motion. Neck supple. No JVD present. No tracheal deviation present. No thyromegaly present.  Cardiovascular: Normal rate, regular rhythm and normal heart sounds.   Pulmonary/Chest: No stridor. No respiratory distress. She has no wheezes.  Abdominal: Soft. Bowel sounds are normal. She exhibits no distension and no mass. There is no tenderness. There is no rebound and no guarding.  Musculoskeletal: She exhibits tenderness. She exhibits no edema.  Lymphadenopathy:    She has no cervical adenopathy.  Neurological: She displays normal reflexes. No cranial nerve deficit. She exhibits normal muscle tone. Coordination normal.  Skin: No rash noted. No erythema.  Psychiatric: She has a normal mood and affect. Her behavior is normal. Judgment and thought content normal.    Lab Results  Component Value Date   WBC 6.2 08/08/2014   HGB 12.8 08/08/2014   HCT 37.9 08/08/2014   PLT 248.0 08/08/2014   GLUCOSE 86 08/23/2015   CHOL 189 05/14/2015   TRIG 107 05/14/2015   HDL 78 05/14/2015   LDLDIRECT 105.5 11/26/2010   LDLCALC 90 05/14/2015   ALT 12 05/14/2015   AST 16 05/14/2015   NA 139 08/23/2015   K 4.2 08/23/2015   CL 103 08/23/2015   CREATININE 0.98* 08/23/2015   BUN 12 08/23/2015   CO2 29 08/23/2015   TSH 1.36 08/23/2015   INR 2.6 08/28/2015   HGBA1C 6.8* 02/08/2015    Mm Digital Screening Bilateral  09/05/2015  CLINICAL DATA:  Screening. EXAM: DIGITAL SCREENING BILATERAL MAMMOGRAM WITH CAD COMPARISON:  Previous exam(s). ACR Breast Density Category b: There are scattered areas of fibroglandular density. FINDINGS: There are no findings suspicious for malignancy. Images were processed with CAD. IMPRESSION: No mammographic evidence of malignancy. A result letter of this screening mammogram will be mailed directly to the  patient. RECOMMENDATION: Screening mammogram in one year. (Code:SM-B-01Y) BI-RADS CATEGORY  1: Negative. Electronically Signed   By: Pamelia Hoit M.D.   On: 09/05/2015 15:35    Assessment & Plan:   There are no diagnoses linked to this encounter. I am having [name] maintain her aspirin, hydroxypropyl methylcellulose / hypromellose, diclofenac sodium, fluocinonide cream, loperamide, hydrOXYzine, isosorbide mononitrate, furosemide, Vitamin D3, ALPRAZolam, simvastatin, traMADol, warfarin, ACCU-CHEK FASTCLIX LANCETS, ACCU-CHEK AVIVA PLUS, glucose blood, pantoprazole, amLODipine, and metoprolol succinate.  No orders of the defined types were placed in this encounter.     Follow-up: No Follow-up on file.  Walker Kehr, MD

## 2015-09-14 NOTE — Patient Instructions (Signed)
Preventive Care for Adults, Female A healthy lifestyle and preventive care can promote health and wellness. Preventive health guidelines for women include the following key practices.  A routine yearly physical is a good way to check with your health care provider about your health and preventive screening. It is a chance to share any concerns and updates on your health and to receive a thorough exam.  Visit your dentist for a routine exam and preventive care every 6 months. Brush your teeth twice a day and floss once a day. Good oral hygiene prevents tooth decay and gum disease.  The frequency of eye exams is based on your age, health, family medical history, use of contact lenses, and other factors. Follow your health care provider's recommendations for frequency of eye exams.  Eat a healthy diet. Foods like vegetables, fruits, whole grains, low-fat dairy products, and lean protein foods contain the nutrients you need without too many calories. Decrease your intake of foods high in solid fats, added sugars, and salt. Eat the right amount of calories for you.Get information about a proper diet from your health care provider, if necessary.  Regular physical exercise is one of the most important things you can do for your health. Most adults should get at least 150 minutes of moderate-intensity exercise (any activity that increases your heart rate and causes you to sweat) each week. In addition, most adults need muscle-strengthening exercises on 2 or more days a week.  Maintain a healthy weight. The body mass index (BMI) is a screening tool to identify possible weight problems. It provides an estimate of body fat based on height and weight. Your health care provider can find your BMI and can help you achieve or maintain a healthy weight.For adults 20 years and older:  A BMI below 18.5 is considered underweight.  A BMI of 18.5 to 24.9 is normal.  A BMI of 25 to 29.9 is considered overweight.  A  BMI of 30 and above is considered obese.  Maintain normal blood lipids and cholesterol levels by exercising and minimizing your intake of saturated fat. Eat a balanced diet with plenty of fruit and vegetables. Blood tests for lipids and cholesterol should begin at age 45 and be repeated every 5 years. If your lipid or cholesterol levels are high, you are over 50, or you are at high risk for heart disease, you may need your cholesterol levels checked more frequently.Ongoing high lipid and cholesterol levels should be treated with medicines if diet and exercise are not working.  If you smoke, find out from your health care provider how to quit. If you do not use tobacco, do not start.  Lung cancer screening is recommended for adults aged 45-80 years who are at high risk for developing lung cancer because of a history of smoking. A yearly low-dose CT scan of the lungs is recommended for people who have at least a 30-pack-year history of smoking and are a current smoker or have quit within the past 15 years. A pack year of smoking is smoking an average of 1 pack of cigarettes a day for 1 year (for example: 1 pack a day for 30 years or 2 packs a day for 15 years). Yearly screening should continue until the smoker has stopped smoking for at least 15 years. Yearly screening should be stopped for people who develop a health problem that would prevent them from having lung cancer treatment.  If you are pregnant, do not drink alcohol. If you are  breastfeeding, be very cautious about drinking alcohol. If you are not pregnant and choose to drink alcohol, do not have more than 1 drink per day. One drink is considered to be 12 ounces (355 mL) of beer, 5 ounces (148 mL) of wine, or 1.5 ounces (44 mL) of liquor.  Avoid use of street drugs. Do not share needles with anyone. Ask for help if you need support or instructions about stopping the use of drugs.  High blood pressure causes heart disease and increases the risk  of stroke. Your blood pressure should be checked at least every 1 to 2 years. Ongoing high blood pressure should be treated with medicines if weight loss and exercise do not work.  If you are 55-79 years old, ask your health care provider if you should take aspirin to prevent strokes.  Diabetes screening is done by taking a blood sample to check your blood glucose level after you have not eaten for a certain period of time (fasting). If you are not overweight and you do not have risk factors for diabetes, you should be screened once every 3 years starting at age 45. If you are overweight or obese and you are 40-70 years of age, you should be screened for diabetes every year as part of your cardiovascular risk assessment.  Breast cancer screening is essential preventive care for women. You should practice "breast self-awareness." This means understanding the normal appearance and feel of your breasts and may include breast self-examination. Any changes detected, no matter how small, should be reported to a health care provider. Women in their 20s and 30s should have a clinical breast exam (CBE) by a health care provider as part of a regular health exam every 1 to 3 years. After age 40, women should have a CBE every year. Starting at age 40, women should consider having a mammogram (breast X-ray test) every year. Women who have a family history of breast cancer should talk to their health care provider about genetic screening. Women at a high risk of breast cancer should talk to their health care providers about having an MRI and a mammogram every year.  Breast cancer gene (BRCA)-related cancer risk assessment is recommended for women who have family members with BRCA-related cancers. BRCA-related cancers include breast, ovarian, tubal, and peritoneal cancers. Having family members with these cancers may be associated with an increased risk for harmful changes (mutations) in the breast cancer genes BRCA1 and  BRCA2. Results of the assessment will determine the need for genetic counseling and BRCA1 and BRCA2 testing.  Your health care provider may recommend that you be screened regularly for cancer of the pelvic organs (ovaries, uterus, and vagina). This screening involves a pelvic examination, including checking for microscopic changes to the surface of your cervix (Pap test). You may be encouraged to have this screening done every 3 years, beginning at age 21.  For women ages 30-65, health care providers may recommend pelvic exams and Pap testing every 3 years, or they may recommend the Pap and pelvic exam, combined with testing for human papilloma virus (HPV), every 5 years. Some types of HPV increase your risk of cervical cancer. Testing for HPV may also be done on women of any age with unclear Pap test results.  Other health care providers may not recommend any screening for nonpregnant women who are considered low risk for pelvic cancer and who do not have symptoms. Ask your health care provider if a screening pelvic exam is right for   you.  If you have had past treatment for cervical cancer or a condition that could lead to cancer, you need Pap tests and screening for cancer for at least 20 years after your treatment. If Pap tests have been discontinued, your risk factors (such as having a new sexual partner) need to be reassessed to determine if screening should resume. Some women have medical problems that increase the chance of getting cervical cancer. In these cases, your health care provider may recommend more frequent screening and Pap tests.  Colorectal cancer can be detected and often prevented. Most routine colorectal cancer screening begins at the age of 50 years and continues through age 75 years. However, your health care provider may recommend screening at an earlier age if you have risk factors for colon cancer. On a yearly basis, your health care provider may provide home test kits to check  for hidden blood in the stool. Use of a small camera at the end of a tube, to directly examine the colon (sigmoidoscopy or colonoscopy), can detect the earliest forms of colorectal cancer. Talk to your health care provider about this at age 50, when routine screening begins. Direct exam of the colon should be repeated every 5-10 years through age 75 years, unless early forms of precancerous polyps or small growths are found.  People who are at an increased risk for hepatitis B should be screened for this virus. You are considered at high risk for hepatitis B if:  You were born in a country where hepatitis B occurs often. Talk with your health care provider about which countries are considered high risk.  Your parents were born in a high-risk country and you have not received a shot to protect against hepatitis B (hepatitis B vaccine).  You have HIV or AIDS.  You use needles to inject street drugs.  You live with, or have sex with, someone who has hepatitis B.  You get hemodialysis treatment.  You take certain medicines for conditions like cancer, organ transplantation, and autoimmune conditions.  Hepatitis C blood testing is recommended for all people born from 1945 through 1965 and any individual with known risks for hepatitis C.  Practice safe sex. Use condoms and avoid high-risk sexual practices to reduce the spread of sexually transmitted infections (STIs). STIs include gonorrhea, chlamydia, syphilis, trichomonas, herpes, HPV, and human immunodeficiency virus (HIV). Herpes, HIV, and HPV are viral illnesses that have no cure. They can result in disability, cancer, and death.  You should be screened for sexually transmitted illnesses (STIs) including gonorrhea and chlamydia if:  You are sexually active and are younger than 24 years.  You are older than 24 years and your health care provider tells you that you are at risk for this type of infection.  Your sexual activity has changed  since you were last screened and you are at an increased risk for chlamydia or gonorrhea. Ask your health care provider if you are at risk.  If you are at risk of being infected with HIV, it is recommended that you take a prescription medicine daily to prevent HIV infection. This is called preexposure prophylaxis (PrEP). You are considered at risk if:  You are sexually active and do not regularly use condoms or know the HIV status of your partner(s).  You take drugs by injection.  You are sexually active with a partner who has HIV.  Talk with your health care provider about whether you are at high risk of being infected with HIV. If   you choose to begin PrEP, you should first be tested for HIV. You should then be tested every 3 months for as long as you are taking PrEP.  Osteoporosis is a disease in which the bones lose minerals and strength with aging. This can result in serious bone fractures or breaks. The risk of osteoporosis can be identified using a bone density scan. Women ages 67 years and over and women at risk for fractures or osteoporosis should discuss screening with their health care providers. Ask your health care provider whether you should take a calcium supplement or vitamin D to reduce the rate of osteoporosis.  Menopause can be associated with physical symptoms and risks. Hormone replacement therapy is available to decrease symptoms and risks. You should talk to your health care provider about whether hormone replacement therapy is right for you.  Use sunscreen. Apply sunscreen liberally and repeatedly throughout the day. You should seek shade when your shadow is shorter than you. Protect yourself by wearing long sleeves, pants, a wide-brimmed hat, and sunglasses year round, whenever you are outdoors.  Once a month, do a whole body skin exam, using a mirror to look at the skin on your back. Tell your health care provider of new moles, moles that have irregular borders, moles that  are larger than a pencil eraser, or moles that have changed in shape or color.  Stay current with required vaccines (immunizations).  Influenza vaccine. All adults should be immunized every year.  Tetanus, diphtheria, and acellular pertussis (Td, Tdap) vaccine. Pregnant women should receive 1 dose of Tdap vaccine during each pregnancy. The dose should be obtained regardless of the length of time since the last dose. Immunization is preferred during the 27th-36th week of gestation. An adult who has not previously received Tdap or who does not know her vaccine status should receive 1 dose of Tdap. This initial dose should be followed by tetanus and diphtheria toxoids (Td) booster doses every 10 years. Adults with an unknown or incomplete history of completing a 3-dose immunization series with Td-containing vaccines should begin or complete a primary immunization series including a Tdap dose. Adults should receive a Td booster every 10 years.  Varicella vaccine. An adult without evidence of immunity to varicella should receive 2 doses or a second dose if she has previously received 1 dose. Pregnant females who do not have evidence of immunity should receive the first dose after pregnancy. This first dose should be obtained before leaving the health care facility. The second dose should be obtained 4-8 weeks after the first dose.  Human papillomavirus (HPV) vaccine. Females aged 13-26 years who have not received the vaccine previously should obtain the 3-dose series. The vaccine is not recommended for use in pregnant females. However, pregnancy testing is not needed before receiving a dose. If a female is found to be pregnant after receiving a dose, no treatment is needed. In that case, the remaining doses should be delayed until after the pregnancy. Immunization is recommended for any person with an immunocompromised condition through the age of 61 years if she did not get any or all doses earlier. During the  3-dose series, the second dose should be obtained 4-8 weeks after the first dose. The third dose should be obtained 24 weeks after the first dose and 16 weeks after the second dose.  Zoster vaccine. One dose is recommended for adults aged 30 years or older unless certain conditions are present.  Measles, mumps, and rubella (MMR) vaccine. Adults born  before 1957 generally are considered immune to measles and mumps. Adults born in 1957 or later should have 1 or more doses of MMR vaccine unless there is a contraindication to the vaccine or there is laboratory evidence of immunity to each of the three diseases. A routine second dose of MMR vaccine should be obtained at least 28 days after the first dose for students attending postsecondary schools, health care workers, or international travelers. People who received inactivated measles vaccine or an unknown type of measles vaccine during 1963-1967 should receive 2 doses of MMR vaccine. People who received inactivated mumps vaccine or an unknown type of mumps vaccine before 1979 and are at high risk for mumps infection should consider immunization with 2 doses of MMR vaccine. For females of childbearing age, rubella immunity should be determined. If there is no evidence of immunity, females who are not pregnant should be vaccinated. If there is no evidence of immunity, females who are pregnant should delay immunization until after pregnancy. Unvaccinated health care workers born before 1957 who lack laboratory evidence of measles, mumps, or rubella immunity or laboratory confirmation of disease should consider measles and mumps immunization with 2 doses of MMR vaccine or rubella immunization with 1 dose of MMR vaccine.  Pneumococcal 13-valent conjugate (PCV13) vaccine. When indicated, a person who is uncertain of his immunization history and has no record of immunization should receive the PCV13 vaccine. All adults 65 years of age and older should receive this  vaccine. An adult aged 19 years or older who has certain medical conditions and has not been previously immunized should receive 1 dose of PCV13 vaccine. This PCV13 should be followed with a dose of pneumococcal polysaccharide (PPSV23) vaccine. Adults who are at high risk for pneumococcal disease should obtain the PPSV23 vaccine at least 8 weeks after the dose of PCV13 vaccine. Adults older than 79 years of age who have normal immune system function should obtain the PPSV23 vaccine dose at least 1 year after the dose of PCV13 vaccine.  Pneumococcal polysaccharide (PPSV23) vaccine. When PCV13 is also indicated, PCV13 should be obtained first. All adults aged 65 years and older should be immunized. An adult younger than age 65 years who has certain medical conditions should be immunized. Any person who resides in a nursing home or long-term care facility should be immunized. An adult smoker should be immunized. People with an immunocompromised condition and certain other conditions should receive both PCV13 and PPSV23 vaccines. People with human immunodeficiency virus (HIV) infection should be immunized as soon as possible after diagnosis. Immunization during chemotherapy or radiation therapy should be avoided. Routine use of PPSV23 vaccine is not recommended for American Indians, [place] Natives, or people younger than 65 years unless there are medical conditions that require PPSV23 vaccine. When indicated, people who have unknown immunization and have no record of immunization should receive PPSV23 vaccine. One-time revaccination 5 years after the first dose of PPSV23 is recommended for people aged 19-64 years who have chronic kidney failure, nephrotic syndrome, asplenia, or immunocompromised conditions. People who received 1-2 doses of PPSV23 before age 65 years should receive another dose of PPSV23 vaccine at age 65 years or later if at least 5 years have passed since the previous dose. Doses of PPSV23 are not  needed for people immunized with PPSV23 at or after age 65 years.  Meningococcal vaccine. Adults with asplenia or persistent complement component deficiencies should receive 2 doses of quadrivalent meningococcal conjugate (MenACWY-D) vaccine. The doses should be obtained   at least 2 months apart. Microbiologists working with certain meningococcal bacteria, [place] recruits, people at risk during an outbreak, and people who travel to or live in countries with a high rate of meningitis should be immunized. A first-year college student up through age 34 years who is living in a residence hall should receive a dose if she did not receive a dose on or after her 16th birthday. Adults who have certain high-risk conditions should receive one or more doses of vaccine.  Hepatitis A vaccine. Adults who wish to be protected from this disease, have certain high-risk conditions, work with hepatitis A-infected animals, work in hepatitis A research labs, or travel to or work in countries with a high rate of hepatitis A should be immunized. Adults who were previously unvaccinated and who anticipate close contact with an international adoptee during the first 60 days after arrival in the [place] from a country with a high rate of hepatitis A should be immunized.  Hepatitis B vaccine. Adults who wish to be protected from this disease, have certain high-risk conditions, may be exposed to blood or other infectious body fluids, are household contacts or sex partners of hepatitis B positive people, are clients or workers in certain care facilities, or travel to or work in countries with a high rate of hepatitis B should be immunized.  Haemophilus influenzae type b (Hib) vaccine. A previously unvaccinated person with asplenia or sickle cell disease or having a scheduled splenectomy should receive 1 dose of Hib vaccine. Regardless of previous immunization, a recipient of a hematopoietic stem cell transplant should receive a  3-dose series 6-12 months after her successful transplant. Hib vaccine is not recommended for adults with HIV infection. Preventive Services / Frequency Ages 35 to 4 years  Blood pressure check.** / Every 3-5 years.  Lipid and cholesterol check.** / Every 5 years beginning at age 60.  Clinical breast exam.** / Every 3 years for women in their 71s and 10s.  BRCA-related cancer risk assessment.** / For women who have family members with a BRCA-related cancer (breast, ovarian, tubal, or peritoneal cancers).  Pap test.** / Every 2 years from ages 76 through 26. Every 3 years starting at age 61 through age 76 or 93 with a history of 3 consecutive normal Pap tests.  HPV screening.** / Every 3 years from ages 37 through ages 60 to 51 with a history of 3 consecutive normal Pap tests.  Hepatitis C blood test.** / For any individual with known risks for hepatitis C.  Skin self-exam. / Monthly.  Influenza vaccine. / Every year.  Tetanus, diphtheria, and acellular pertussis (Tdap, Td) vaccine.** / Consult your health care provider. Pregnant women should receive 1 dose of Tdap vaccine during each pregnancy. 1 dose of Td every 10 years.  Varicella vaccine.** / Consult your health care provider. Pregnant females who do not have evidence of immunity should receive the first dose after pregnancy.  HPV vaccine. / 3 doses over 6 months, if 93 and younger. The vaccine is not recommended for use in pregnant females. However, pregnancy testing is not needed before receiving a dose.  Measles, mumps, rubella (MMR) vaccine.** / You need at least 1 dose of MMR if you were born in 1957 or later. You may also need a 2nd dose. For females of childbearing age, rubella immunity should be determined. If there is no evidence of immunity, females who are not pregnant should be vaccinated. If there is no evidence of immunity, females who are  pregnant should delay immunization until after pregnancy.  Pneumococcal  13-valent conjugate (PCV13) vaccine.** / Consult your health care provider.  Pneumococcal polysaccharide (PPSV23) vaccine.** / 1 to 2 doses if you smoke cigarettes or if you have certain conditions.  Meningococcal vaccine.** / 1 dose if you are age 68 to 8 years and a Market researcher living in a residence hall, or have one of several medical conditions, you need to get vaccinated against meningococcal disease. You may also need additional booster doses.  Hepatitis A vaccine.** / Consult your health care provider.  Hepatitis B vaccine.** / Consult your health care provider.  Haemophilus influenzae type b (Hib) vaccine.** / Consult your health care provider. Ages 7 to 53 years  Blood pressure check.** / Every year.  Lipid and cholesterol check.** / Every 5 years beginning at age 25 years.  Lung cancer screening. / Every year if you are aged 11-80 years and have a 30-pack-year history of smoking and currently smoke or have quit within the past 15 years. Yearly screening is stopped once you have quit smoking for at least 15 years or develop a health problem that would prevent you from having lung cancer treatment.  Clinical breast exam.** / Every year after age 48 years.  BRCA-related cancer risk assessment.** / For women who have family members with a BRCA-related cancer (breast, ovarian, tubal, or peritoneal cancers).  Mammogram.** / Every year beginning at age 41 years and continuing for as long as you are in good health. Consult with your health care provider.  Pap test.** / Every 3 years starting at age 65 years through age 37 or 70 years with a history of 3 consecutive normal Pap tests.  HPV screening.** / Every 3 years from ages 72 years through ages 60 to 40 years with a history of 3 consecutive normal Pap tests.  Fecal occult blood test (FOBT) of stool. / Every year beginning at age 21 years and continuing until age 5 years. You may not need to do this test if you get  a colonoscopy every 10 years.  Flexible sigmoidoscopy or colonoscopy.** / Every 5 years for a flexible sigmoidoscopy or every 10 years for a colonoscopy beginning at age 35 years and continuing until age 48 years.  Hepatitis C blood test.** / For all people born from [birth year] through 1965 and any individual with known risks for hepatitis C.  Skin self-exam. / Monthly.  Influenza vaccine. / Every year.  Tetanus, diphtheria, and acellular pertussis (Tdap/Td) vaccine.** / Consult your health care provider. Pregnant women should receive 1 dose of Tdap vaccine during each pregnancy. 1 dose of Td every 10 years.  Varicella vaccine.** / Consult your health care provider. Pregnant females who do not have evidence of immunity should receive the first dose after pregnancy.  Zoster vaccine.** / 1 dose for adults aged 30 years or older.  Measles, mumps, rubella (MMR) vaccine.** / You need at least 1 dose of MMR if you were born in 1957 or later. You may also need a second dose. For females of childbearing age, rubella immunity should be determined. If there is no evidence of immunity, females who are not pregnant should be vaccinated. If there is no evidence of immunity, females who are pregnant should delay immunization until after pregnancy.  Pneumococcal 13-valent conjugate (PCV13) vaccine.** / Consult your health care provider.  Pneumococcal polysaccharide (PPSV23) vaccine.** / 1 to 2 doses if you smoke cigarettes or if you have certain conditions.  Meningococcal vaccine.** /  Consult your health care provider.  Hepatitis A vaccine.** / Consult your health care provider.  Hepatitis B vaccine.** / Consult your health care provider.  Haemophilus influenzae type b (Hib) vaccine.** / Consult your health care provider. Ages 64 years and over  Blood pressure check.** / Every year.  Lipid and cholesterol check.** / Every 5 years beginning at age 23 years.  Lung cancer screening. / Every year if you  are aged 16-80 years and have a 30-pack-year history of smoking and currently smoke or have quit within the past 15 years. Yearly screening is stopped once you have quit smoking for at least 15 years or develop a health problem that would prevent you from having lung cancer treatment.  Clinical breast exam.** / Every year after age 74 years.  BRCA-related cancer risk assessment.** / For women who have family members with a BRCA-related cancer (breast, ovarian, tubal, or peritoneal cancers).  Mammogram.** / Every year beginning at age 44 years and continuing for as long as you are in good health. Consult with your health care provider.  Pap test.** / Every 3 years starting at age 58 years through age 22 or 39 years with 3 consecutive normal Pap tests. Testing can be stopped between 65 and 70 years with 3 consecutive normal Pap tests and no abnormal Pap or HPV tests in the past 10 years.  HPV screening.** / Every 3 years from ages 64 years through ages 70 or 61 years with a history of 3 consecutive normal Pap tests. Testing can be stopped between 65 and 70 years with 3 consecutive normal Pap tests and no abnormal Pap or HPV tests in the past 10 years.  Fecal occult blood test (FOBT) of stool. / Every year beginning at age 40 years and continuing until age 27 years. You may not need to do this test if you get a colonoscopy every 10 years.  Flexible sigmoidoscopy or colonoscopy.** / Every 5 years for a flexible sigmoidoscopy or every 10 years for a colonoscopy beginning at age 7 years and continuing until age 32 years.  Hepatitis C blood test.** / For all people born from [birth year] through 1963 and any individual with known risks for hepatitis C.  Osteoporosis screening.** / A one-time screening for women ages 30 years and over and women at risk for fractures or osteoporosis.  Skin self-exam. / Monthly.  Influenza vaccine. / Every year.  Tetanus, diphtheria, and acellular pertussis (Tdap/Td)  vaccine.** / 1 dose of Td every 10 years.  Varicella vaccine.** / Consult your health care provider.  Zoster vaccine.** / 1 dose for adults aged 35 years or older.  Pneumococcal 13-valent conjugate (PCV13) vaccine.** / Consult your health care provider.  Pneumococcal polysaccharide (PPSV23) vaccine.** / 1 dose for all adults aged 46 years and older.  Meningococcal vaccine.** / Consult your health care provider.  Hepatitis A vaccine.** / Consult your health care provider.  Hepatitis B vaccine.** / Consult your health care provider.  Haemophilus influenzae type b (Hib) vaccine.** / Consult your health care provider. ** Family history and personal history of risk and conditions may change your health care provider's recommendations.   This information is not intended to replace advice given to you by your health care provider. Make sure you discuss any questions you have with your health care provider.   Document Released: 05/27/2001 Document Revised: 04/21/2014 Document Reviewed: 08/26/2010 Elsevier Interactive Patient Education [employer].

## 2015-09-14 NOTE — Assessment & Plan Note (Signed)
Xanax prn  Potential benefits of a long term benzodiazepines  use as well as potential risks  and complications were explained to the patient and were aknowledged. 

## 2015-09-14 NOTE — Assessment & Plan Note (Signed)
Here for medicare wellness/physical  Diet: heart healthy  Physical activity: not sedentary  Depression/mood screen: grieving Hearing: intact to whispered voice  Visual acuity: grossly normal, performs annual eye exam  ADLs: capable  Fall risk: low   Home safety: good  Cognitive evaluation: intact to orientation, naming, recall and repetition  EOL planning: adv directives, full code/ I agree  I have personally reviewed and have noted  1. The patient's medical, surgical and social history  2. Their use of alcohol, tobacco or illicit drugs  3. Their current medications and supplements  4. The patient's functional ability including ADL's, fall risks, home safety risks and hearing or visual impairment.  5. Diet and physical activities  6. Evidence for depression or mood disorders 7. The roster of all physicians providing medical care to patient - is listed in the Snapshot section of the chart and reviewed today.    Today patient counseled on age appropriate routine health concerns for screening and prevention, each reviewed and up to date or declined. Immunizations reviewed and up to date or declined. Labs ordered and reviewed. Risk factors for depression reviewed and negative. Hearing function and visual acuity are intact. ADLs screened and addressed as needed. Functional ability and level of safety reviewed and appropriate. Education, counseling and referrals performed based on assessed risks today. Patient provided with a copy of personalized plan for preventive services.  Had a mammo Pt declined all vaccines

## 2015-09-14 NOTE — Assessment & Plan Note (Signed)
On Simvastatin 

## 2015-09-14 NOTE — Assessment & Plan Note (Signed)
Labs

## 2015-09-14 NOTE — Assessment & Plan Note (Signed)
Chronic Metoprolol, Norvasc, Lasix

## 2015-09-17 ENCOUNTER — Other Ambulatory Visit: Payer: Self-pay | Admitting: Internal Medicine

## 2015-10-05 ENCOUNTER — Other Ambulatory Visit: Payer: Self-pay | Admitting: *Deleted

## 2015-10-05 MED ORDER — CILIDINIUM-CHLORDIAZEPOXIDE 2.5-5 MG PO CAPS: 1.0000 | ORAL_CAPSULE | Freq: Three times a day (TID) | ORAL | Status: DC | PRN

## 2015-10-05 NOTE — Telephone Encounter (Signed)
Rx printed faxed to [facility]...Johny Chess

## 2015-10-11 ENCOUNTER — Other Ambulatory Visit (INDEPENDENT_AMBULATORY_CARE_PROVIDER_SITE_OTHER): Payer: Commercial Managed Care - HMO

## 2015-10-11 ENCOUNTER — Ambulatory Visit (INDEPENDENT_AMBULATORY_CARE_PROVIDER_SITE_OTHER): Payer: Commercial Managed Care - HMO | Admitting: Pharmacist Clinician (PhC)/ Clinical Pharmacy Specialist

## 2015-10-11 DIAGNOSIS — I4891 Unspecified atrial fibrillation: Secondary | ICD-10-CM

## 2015-10-11 DIAGNOSIS — I480 Paroxysmal atrial fibrillation: Secondary | ICD-10-CM | POA: Diagnosis not present

## 2015-10-11 DIAGNOSIS — Z7901 Long term (current) use of anticoagulants: Secondary | ICD-10-CM

## 2015-10-11 DIAGNOSIS — Z0000 Encounter for general adult medical examination without abnormal findings: Secondary | ICD-10-CM | POA: Diagnosis not present

## 2015-10-11 LAB — HEPATIC FUNCTION PANEL
ALBUMIN: 4 g/dL (ref 3.5–5.2)
ALT: 9 U/L (ref 0–35)
AST: 13 U/L (ref 0–37)
Alkaline Phosphatase: 61 U/L (ref 39–117)
BILIRUBIN TOTAL: 0.5 mg/dL (ref 0.2–1.2)
Bilirubin, Direct: 0.1 mg/dL (ref 0.0–0.3)
Total Protein: 7.1 g/dL (ref 6.0–8.3)

## 2015-10-11 LAB — URINALYSIS, ROUTINE W REFLEX MICROSCOPIC
Bilirubin Urine: NEGATIVE
KETONES UR: NEGATIVE
Leukocytes, UA: NEGATIVE
Nitrite: NEGATIVE
PH: 5.5 (ref 5.0–8.0)
Total Protein, Urine: NEGATIVE
UROBILINOGEN UA: 0.2 (ref 0.0–1.0)
Urine Glucose: NEGATIVE
WBC UA: NONE SEEN (ref 0–?)

## 2015-10-11 LAB — BASIC METABOLIC PANEL
BUN: 14 mg/dL (ref 6–23)
CHLORIDE: 104 meq/L (ref 96–112)
CO2: 30 mEq/L (ref 19–32)
Calcium: 9.2 mg/dL (ref 8.4–10.5)
Creatinine, Ser: 1.02 mg/dL (ref 0.40–1.20)
GFR: 67.23 mL/min (ref >60.00)
Glucose, Bld: 126 mg/dL — ABNORMAL HIGH (ref 70–99)
POTASSIUM: 4.2 meq/L (ref 3.5–5.1)
Sodium: 137 mEq/L (ref 135–145)

## 2015-10-11 LAB — CBC WITH DIFFERENTIAL/PLATELET
Basophils Absolute: 0 10*3/uL (ref 0.0–0.1)
Basophils Relative: 0.4 % (ref 0.0–3.0)
EOS PCT: 1.8 % (ref 0.0–5.0)
Eosinophils Absolute: 0.1 10*3/uL (ref 0.0–0.7)
HCT: 36.9 % (ref 36.0–46.0)
HEMOGLOBIN: 12.4 g/dL (ref 12.0–15.0)
Lymphocytes Relative: 33.6 % (ref 12.0–46.0)
Lymphs Abs: 1.5 10*3/uL (ref 0.7–4.0)
MCHC: 33.5 g/dL (ref 30.0–36.0)
MCV: 88 fl (ref 78.0–100.0)
MONOS PCT: 8.6 % (ref 3.0–12.0)
Monocytes Absolute: 0.4 10*3/uL (ref 0.1–1.0)
Neutro Abs: 2.4 10*3/uL (ref 1.4–7.7)
Neutrophils Relative %: 55.6 % (ref 43.0–77.0)
Platelets: 222 10*3/uL (ref 150.0–400.0)
RBC: 4.2 Mil/uL (ref 3.87–5.11)
RDW: 14.1 % (ref 11.5–15.5)
WBC: 4.3 10*3/uL (ref 4.0–10.5)

## 2015-10-11 LAB — LIPID PANEL
CHOL/HDL RATIO: 3
CHOLESTEROL: 181 mg/dL (ref 0–200)
HDL: 65 mg/dL (ref >39.00)
LDL CALC: 98 mg/dL (ref 0–99)
NonHDL: 115.61
Triglycerides: 88 mg/dL (ref 0.0–149.0)
VLDL: 17.6 mg/dL (ref 0.0–40.0)

## 2015-10-11 LAB — TSH: TSH: 1.09 u[IU]/mL (ref 0.35–4.50)

## 2015-10-11 LAB — POCT INR: INR: 2.5

## 2015-10-11 NOTE — Addendum Note (Signed)
Addended by: Aviva Signs M on: 10/11/2015 09:20 AM   Modules accepted: Orders

## 2015-11-13 ENCOUNTER — Encounter: Payer: Self-pay | Admitting: Family

## 2015-11-13 ENCOUNTER — Ambulatory Visit (INDEPENDENT_AMBULATORY_CARE_PROVIDER_SITE_OTHER): Payer: Commercial Managed Care - HMO | Admitting: Family

## 2015-11-13 DIAGNOSIS — H9209 Otalgia, unspecified ear: Secondary | ICD-10-CM | POA: Insufficient documentation

## 2015-11-13 DIAGNOSIS — H9202 Otalgia, left ear: Secondary | ICD-10-CM | POA: Diagnosis not present

## 2015-11-13 MED ORDER — NEOMYCIN-POLYMYXIN-HC 3.5-10000-1 OT SOLN: 4.0000 [drp] | Freq: Three times a day (TID) | OTIC | 0 refills | Status: DC

## 2015-11-13 NOTE — Assessment & Plan Note (Signed)
Mild inflammation noted in the ear canal which appears consistent with possible otitis externa. There is impacted cerumen noted bilaterally. Recommend at home ear cleaning kits. Start Cortisporin. Follow-up if symptoms worsen or do not improve.

## 2015-11-13 NOTE — Progress Notes (Signed)
Subjective:    Patient ID: [name], female    DOB: 09-Jun-1934, 81 y.o.   MRN: [id number]  Chief Complaint  Patient presents with  . Otalgia    Left ear pain since friday, hurts worse at night    HPI:  [name] is a 81 y.o. female who  has a past medical history of Anxiety state, unspecified; Bronchitis, not specified as acute or chronic; Diaphragmatic hernia without mention of obstruction or gangrene; Dyslipidemia; Essential hypertension; Irritable bowel syndrome; Mitral valve disorders; Non-obstructive CAD; Osteoarthrosis, unspecified whether generalized or localized, unspecified site; Osteoporosis, unspecified; Other abnormal glucose; PAF (paroxysmal atrial fibrillation) ([place]); Phlebitis and thrombophlebitis of superficial vessels of lower extremities; PSVT (paroxysmal supraventricular tachycardia) ([place]); Unspecified venous (peripheral) insufficiency; and Unspecified vitamin D deficiency. and presents today for an acute office visit.  This is a new problem. Associated symptom of pain located in her left ear has been going on for about 4 days. Timing of the symptoms is generally worse at night. Denies fevers. No changes in hearing or discharge. Modifying factors include sweet oil on a cotton ball which did not help very much. Pain is described as achy.   Allergies  Allergen Reactions  . Clarithromycin     REACTION: unspecified  . Fosamax [Alendronate Sodium]     achy  . Guaifenesin Er     itching  . Metformin     REACTION: pt states INTOL to Metformin "felt drained"  . Tape     Adhesive tape---rash     Current Outpatient Prescriptions on File Prior to Visit  Medication Sig Dispense Refill  . ACCU-CHEK FASTCLIX LANCETS MISC Use to check blood sugars twice a day Dx E11.9 102 each 2  . ALPRAZolam (XANAX) 0.5 MG tablet take 1/2-1 tablet by mouth three times a day if needed 90 tablet 2  . amLODipine (NORVASC) 5 MG tablet take 1 tablet by mouth once daily 30 tablet 3   . aspirin 81 MG tablet Take 81 mg by mouth daily.      . Blood Glucose Monitoring Suppl (ACCU-CHEK AVIVA PLUS) w/Device KIT Use to check blood sugars daily Dx E11.9 1 kit 0  . Cholecalciferol (VITAMIN D3) 2000 UNITS capsule Take 1 capsule (2,000 Units total) by mouth daily. 100 capsule 3  . clidinium-chlordiazePOXIDE (LIBRAX) 5-2.5 MG capsule Take 1 capsule by mouth 3 (three) times daily as needed (for cramps). 60 capsule 2  . diclofenac sodium (VOLTAREN) 1 % GEL Apply 2 g topically 4 (four) times daily. Apply to left ankle four times per day per Dr. Samara Snide    . fluocinonide cream (LIDEX) 0.05 % Apply twice daily to rash as needed 30 g 5  . furosemide (LASIX) 20 MG tablet Take 1 tablet (20 mg total) by mouth daily as needed for edema. 30 tablet 3  . glucose blood (ACCU-CHEK AVIVA PLUS) test strip 1 each by Other route 2 (two) times daily. Use to check blood sugars twice a day Dx E11.9 100 each 2  . hydroxypropyl methylcellulose (ISOPTO TEARS) 2.5 % ophthalmic solution Place 1 drop into both eyes 2 (two) times daily.      . hydrOXYzine (ATARAX/VISTARIL) 25 MG tablet Take 1 tablet (25 mg total) by mouth every 8 (eight) hours as needed for itching. 60 tablet 0  . isosorbide mononitrate (IMDUR) 30 MG 24 hr tablet take 1 tablet by mouth once daily 30 tablet 10  . loperamide (IMODIUM A-D) 2 MG tablet Take 1 tablet (2 mg  total) by mouth 4 (four) times daily as needed for diarrhea or loose stools. 30 tablet 0  . metoprolol succinate (TOPROL-XL) 25 MG 24 hr tablet Take 1.5 tablets (37.5 mg total) by mouth 2 (two) times daily. 270 tablet 3  . pantoprazole (PROTONIX) 40 MG tablet take 1 tablet by mouth once daily 30 tablet 7  . simvastatin (ZOCOR) 20 MG tablet Take 1 tablet (20 mg total) by mouth at bedtime. 90 tablet 3  . traMADol (ULTRAM) 50 MG tablet Take 1 tablet (50 mg total) by mouth 3 (three) times daily. 90 tablet 2  . warfarin (COUMADIN) 5 MG tablet TAKE 1 TABLET BY MOUTH DAILY OR AS DIRECTED BY  COUMADIN CLINIC 30 tablet 5   No current facility-administered medications on file prior to visit.       Review of Systems  Constitutional: Negative for chills and fever.  HENT: Positive for ear pain. Negative for congestion, ear discharge and postnasal drip.   Neurological: Negative for headaches.      Objective:    BP 112/62 (BP Location: Left Arm, Patient Position: Sitting, Cuff Size: Large)   Pulse 66   Temp 97.7 F (36.5 C) (Oral)   Resp 16   Ht '5\' 6"'$  (1.676 m)   Wt 187 lb (84.8 kg)   SpO2 97%   BMI 30.18 kg/m  Nursing note and vital signs reviewed.  Physical Exam  Constitutional: She is oriented to person, place, and time. She appears well-developed and well-nourished. No distress.  HENT:  Right Ear: Hearing, external ear and ear canal normal.  Left Ear: Hearing and external ear normal.  Increased amount of cerumen noted bilaterally. There appears to be mild redness of the left ear canal.   Cardiovascular: Normal rate, regular rhythm, normal heart sounds and intact distal pulses.   Pulmonary/Chest: Effort normal and breath sounds normal.  Neurological: She is alert and oriented to person, place, and time.  Skin: Skin is warm and dry.  Psychiatric: She has a normal mood and affect. Her behavior is normal. Judgment and thought content normal.       Assessment & Plan:   Problem List Items Addressed This Visit      Other   Ear discomfort    Mild inflammation noted in the ear canal which appears consistent with possible otitis externa. There is impacted cerumen noted bilaterally. Recommend at home ear cleaning kits. Start Cortisporin. Follow-up if symptoms worsen or do not improve.      Relevant Medications   neomycin-polymyxin-hydrocortisone (CORTISPORIN) otic solution    Other Visit Diagnoses   None.      I am having Ms. Megan Salon start on neomycin-polymyxin-hydrocortisone. I am also having her maintain her aspirin, hydroxypropyl methylcellulose /  hypromellose, diclofenac sodium, fluocinonide cream, loperamide, hydrOXYzine, furosemide, Vitamin D3, simvastatin, ACCU-CHEK FASTCLIX LANCETS, ACCU-CHEK AVIVA PLUS, glucose blood, pantoprazole, amLODipine, metoprolol succinate, ALPRAZolam, warfarin, traMADol, isosorbide mononitrate, and clidinium-chlordiazePOXIDE.   Meds ordered this encounter  Medications  . neomycin-polymyxin-hydrocortisone (CORTISPORIN) otic solution    Sig: Place 4 drops into the left ear 3 (three) times daily.    Dispense:  10 mL    Refill:  0    Order Specific Question:   Supervising Provider    Answer:   Pricilla Holm A [[id number]]     Follow-up: Return if symptoms worsen or fail to improve.  Mauricio Po, FNP

## 2015-11-13 NOTE — Patient Instructions (Signed)
Thank you for choosing [facility].  Summary/Instructions:  Please start the ear drops.  To clean your ears use either Debrox or Murine ear cleaning kit.  Check with the pharmacist if you have difficulty finding either product.  Your prescription(s) have been submitted to your pharmacy or been printed and provided for you. Please take as directed and contact our office if you believe you are having problem(s) with the medication(s) or have any questions.  If your symptoms worsen or fail to improve, please contact our office for further instruction, or in case of emergency go directly to the emergency room at the closest medical facility.     

## 2015-11-19 ENCOUNTER — Other Ambulatory Visit: Payer: Self-pay | Admitting: Internal Medicine

## 2015-11-19 ENCOUNTER — Encounter: Payer: Self-pay | Admitting: Internal Medicine

## 2015-11-19 ENCOUNTER — Ambulatory Visit (INDEPENDENT_AMBULATORY_CARE_PROVIDER_SITE_OTHER): Payer: Commercial Managed Care - HMO | Admitting: Internal Medicine

## 2015-11-19 DIAGNOSIS — H6120 Impacted cerumen, unspecified ear: Secondary | ICD-10-CM | POA: Insufficient documentation

## 2015-11-19 DIAGNOSIS — H9202 Otalgia, left ear: Secondary | ICD-10-CM | POA: Diagnosis not present

## 2015-11-19 DIAGNOSIS — H6123 Impacted cerumen, bilateral: Secondary | ICD-10-CM | POA: Diagnosis not present

## 2015-11-19 NOTE — Assessment & Plan Note (Signed)
L OE, wax Irrigated Use Cortisporin for 3 more days

## 2015-11-19 NOTE — Patient Instructions (Signed)
Use Cortisporin for 3 more days

## 2015-11-19 NOTE — Progress Notes (Signed)
Pre visit review using our clinic review tool, if applicable. No additional management support is needed unless otherwise documented below in the visit note. 

## 2015-11-19 NOTE — Progress Notes (Signed)
Subjective:  Patient ID: [name], female    DOB: 01-05-1935  Age: 81 y.o. MRN: [id number]  CC: No chief complaint on file.   HPI [name] presents for L ear pain - a little better, but not well...  Outpatient Medications Prior to Visit  Medication Sig Dispense Refill  . ACCU-CHEK FASTCLIX LANCETS MISC Use to check blood sugars twice a day Dx E11.9 102 each 2  . ALPRAZolam (XANAX) 0.5 MG tablet take 1/2-1 tablet by mouth three times a day if needed 90 tablet 2  . aspirin 81 MG tablet Take 81 mg by mouth daily.      . Blood Glucose Monitoring Suppl (ACCU-CHEK AVIVA PLUS) w/Device KIT Use to check blood sugars daily Dx E11.9 1 kit 0  . Cholecalciferol (VITAMIN D3) 2000 UNITS capsule Take 1 capsule (2,000 Units total) by mouth daily. 100 capsule 3  . clidinium-chlordiazePOXIDE (LIBRAX) 5-2.5 MG capsule Take 1 capsule by mouth 3 (three) times daily as needed (for cramps). 60 capsule 2  . diclofenac sodium (VOLTAREN) 1 % GEL Apply 2 g topically 4 (four) times daily. Apply to left ankle four times per day per Dr. Samara Snide    . fluocinonide cream (LIDEX) 0.05 % Apply twice daily to rash as needed 30 g 5  . furosemide (LASIX) 20 MG tablet Take 1 tablet (20 mg total) by mouth daily as needed for edema. 30 tablet 3  . glucose blood (ACCU-CHEK AVIVA PLUS) test strip 1 each by Other route 2 (two) times daily. Use to check blood sugars twice a day Dx E11.9 100 each 2  . hydroxypropyl methylcellulose (ISOPTO TEARS) 2.5 % ophthalmic solution Place 1 drop into both eyes 2 (two) times daily.      . hydrOXYzine (ATARAX/VISTARIL) 25 MG tablet Take 1 tablet (25 mg total) by mouth every 8 (eight) hours as needed for itching. 60 tablet 0  . isosorbide mononitrate (IMDUR) 30 MG 24 hr tablet take 1 tablet by mouth once daily 30 tablet 10  . loperamide (IMODIUM A-D) 2 MG tablet Take 1 tablet (2 mg total) by mouth 4 (four) times daily as needed for diarrhea or loose stools. 30 tablet 0  . metoprolol  succinate (TOPROL-XL) 25 MG 24 hr tablet Take 1.5 tablets (37.5 mg total) by mouth 2 (two) times daily. 270 tablet 3  . neomycin-polymyxin-hydrocortisone (CORTISPORIN) otic solution Place 4 drops into the left ear 3 (three) times daily. 10 mL 0  . pantoprazole (PROTONIX) 40 MG tablet take 1 tablet by mouth once daily 30 tablet 7  . simvastatin (ZOCOR) 20 MG tablet Take 1 tablet (20 mg total) by mouth at bedtime. 90 tablet 3  . traMADol (ULTRAM) 50 MG tablet Take 1 tablet (50 mg total) by mouth 3 (three) times daily. 90 tablet 2  . warfarin (COUMADIN) 5 MG tablet TAKE 1 TABLET BY MOUTH DAILY OR AS DIRECTED BY COUMADIN CLINIC 30 tablet 5  . amLODipine (NORVASC) 5 MG tablet take 1 tablet by mouth once daily 30 tablet 3   No facility-administered medications prior to visit.     ROS Review of Systems  Constitutional: Negative for chills and fever.  HENT: Positive for ear discharge and ear pain.   Neurological: Negative for dizziness, weakness, light-headedness and headaches.    Objective:  BP 124/70   Pulse (!) 47   Temp 98 F (36.7 C) (Oral)   Wt 184 lb (83.5 kg)   SpO2 95%   BMI 29.70 kg/m  BP Readings from Last 3 Encounters:  11/19/15 124/70  11/13/15 112/62  09/14/15 (!) 148/70    Wt Readings from Last 3 Encounters:  11/19/15 184 lb (83.5 kg)  11/13/15 187 lb (84.8 kg)  09/14/15 182 lb (82.6 kg)    Physical Exam  Constitutional: She appears well-developed. No distress.  HENT:  Head: Normocephalic.  Nose: Nose normal.  Mouth/Throat: Oropharynx is clear and moist.  Eyes: Conjunctivae are normal. Pupils are equal, round, and reactive to light. Right eye exhibits no discharge. Left eye exhibits no discharge.  Neck: Normal range of motion. Neck supple. No JVD present. No tracheal deviation present. No thyromegaly present.  Cardiovascular: Normal rate, regular rhythm and normal heart sounds.   Pulmonary/Chest: No stridor. No respiratory distress. She has no wheezes.    Abdominal: Soft. Bowel sounds are normal. She exhibits no distension and no mass. There is no tenderness. There is no rebound and no guarding.  Musculoskeletal: She exhibits no edema or tenderness.  Lymphadenopathy:    She has no cervical adenopathy.  Neurological: She displays normal reflexes. No cranial nerve deficit. She exhibits normal muscle tone. Coordination normal.  Skin: No rash noted. No erythema.  Psychiatric: She has a normal mood and affect. Her behavior is normal. Judgment and thought content normal.  B ear wax L ear w/redness of her ear canal    Procedure Note :     Procedure :  Ear irrigation   Indication:  Cerumen impaction B   Risks, including pain, dizziness, eardrum perforation, bleeding, infection and others as well as benefits were explained to the patient in detail. Verbal consent was obtained and the patient agreed to proceed.    We used "The Elephant Ear Irrigation Device" filled with lukewarm water for irrigation. A large amount wax was recovered. Procedure has also required manual wax removal with an ear loop. R ear is wax free. L ear w/some residual white debris and redness.   Tolerated well. Complications: None.   Postprocedure instructions :  Call if problems.    Lab Results  Component Value Date   WBC 4.3 10/11/2015   HGB 12.4 10/11/2015   HCT 36.9 10/11/2015   PLT 222.0 10/11/2015   GLUCOSE 126 (H) 10/11/2015   CHOL 181 10/11/2015   TRIG 88.0 10/11/2015   HDL 65.00 10/11/2015   LDLDIRECT 105.5 11/26/2010   LDLCALC 98 10/11/2015   ALT 9 10/11/2015   AST 13 10/11/2015   NA 137 10/11/2015   K 4.2 10/11/2015   CL 104 10/11/2015   CREATININE 1.02 10/11/2015   BUN 14 10/11/2015   CO2 30 10/11/2015   TSH 1.09 10/11/2015   INR 2.5 10/11/2015   HGBA1C 6.8 (H) 02/08/2015    Mm Digital Screening Bilateral  Result Date: 09/05/2015 CLINICAL DATA:  Screening. EXAM: DIGITAL SCREENING BILATERAL MAMMOGRAM WITH CAD COMPARISON:  Previous exam(s).  ACR Breast Density Category b: There are scattered areas of fibroglandular density. FINDINGS: There are no findings suspicious for malignancy. Images were processed with CAD. IMPRESSION: No mammographic evidence of malignancy. A result letter of this screening mammogram will be mailed directly to the patient. RECOMMENDATION: Screening mammogram in one year. (Code:SM-B-01Y) BI-RADS CATEGORY  1: Negative. Electronically Signed   By: Pamelia Hoit M.D.   On: 09/05/2015 15:35    Assessment & Plan:   Diagnoses and all orders for this visit:  Cerumen impaction, bilateral  Ear discomfort, left   I am having Ms. Tregre maintain her aspirin, hydroxypropyl methylcellulose / hypromellose, diclofenac  sodium, fluocinonide cream, loperamide, hydrOXYzine, furosemide, Vitamin D3, simvastatin, ACCU-CHEK FASTCLIX LANCETS, ACCU-CHEK AVIVA PLUS, glucose blood, pantoprazole, metoprolol succinate, ALPRAZolam, warfarin, traMADol, isosorbide mononitrate, clidinium-chlordiazePOXIDE, and neomycin-polymyxin-hydrocortisone.  No orders of the defined types were placed in this encounter.    Follow-up: No Follow-up on file.  Walker Kehr, MD

## 2015-11-19 NOTE — Assessment & Plan Note (Signed)
S/p irrigation

## 2015-11-20 ENCOUNTER — Ambulatory Visit (INDEPENDENT_AMBULATORY_CARE_PROVIDER_SITE_OTHER): Payer: Commercial Managed Care - HMO | Admitting: Pharmacist Clinician (PhC)/ Clinical Pharmacy Specialist

## 2015-11-20 DIAGNOSIS — I4891 Unspecified atrial fibrillation: Secondary | ICD-10-CM | POA: Diagnosis not present

## 2015-11-20 DIAGNOSIS — Z7901 Long term (current) use of anticoagulants: Secondary | ICD-10-CM

## 2015-11-20 LAB — POCT INR: INR: 2.9

## 2015-12-20 ENCOUNTER — Telehealth: Payer: Self-pay | Admitting: Internal Medicine

## 2015-12-20 DIAGNOSIS — H9209 Otalgia, unspecified ear: Secondary | ICD-10-CM

## 2015-12-20 NOTE — Telephone Encounter (Signed)
Pt request referral for ENT due to ear pain (not better since last ov with Dr. Camila Li). Please call pt back

## 2015-12-20 NOTE — Telephone Encounter (Signed)
Pt informed referral placed.

## 2015-12-20 NOTE — Telephone Encounter (Signed)
OK. Thx

## 2016-01-01 ENCOUNTER — Ambulatory Visit (INDEPENDENT_AMBULATORY_CARE_PROVIDER_SITE_OTHER): Payer: Commercial Managed Care - HMO | Admitting: Pharmacist

## 2016-01-01 DIAGNOSIS — Z7901 Long term (current) use of anticoagulants: Secondary | ICD-10-CM | POA: Diagnosis not present

## 2016-01-01 DIAGNOSIS — I4891 Unspecified atrial fibrillation: Secondary | ICD-10-CM

## 2016-01-01 LAB — POCT INR: INR: 2.4

## 2016-01-14 DIAGNOSIS — H9202 Otalgia, left ear: Secondary | ICD-10-CM | POA: Diagnosis not present

## 2016-01-14 DIAGNOSIS — H6123 Impacted cerumen, bilateral: Secondary | ICD-10-CM | POA: Insufficient documentation

## 2016-01-14 DIAGNOSIS — H9012 Conductive hearing loss, unilateral, left ear, with unrestricted hearing on the contralateral side: Secondary | ICD-10-CM | POA: Insufficient documentation

## 2016-01-14 DIAGNOSIS — H9192 Unspecified hearing loss, left ear: Secondary | ICD-10-CM | POA: Diagnosis not present

## 2016-02-04 DIAGNOSIS — R3121 Asymptomatic microscopic hematuria: Secondary | ICD-10-CM | POA: Diagnosis not present

## 2016-02-11 ENCOUNTER — Ambulatory Visit (INDEPENDENT_AMBULATORY_CARE_PROVIDER_SITE_OTHER): Payer: Commercial Managed Care - HMO | Admitting: Pharmacist Clinician (PhC)/ Clinical Pharmacy Specialist

## 2016-02-11 DIAGNOSIS — I4891 Unspecified atrial fibrillation: Secondary | ICD-10-CM

## 2016-02-11 DIAGNOSIS — Z7901 Long term (current) use of anticoagulants: Secondary | ICD-10-CM | POA: Diagnosis not present

## 2016-02-11 LAB — POCT INR: INR: 2.3

## 2016-02-23 ENCOUNTER — Other Ambulatory Visit: Payer: Self-pay | Admitting: Internal Medicine

## 2016-03-05 ENCOUNTER — Other Ambulatory Visit: Payer: Self-pay | Admitting: Internal Medicine

## 2016-03-09 ENCOUNTER — Other Ambulatory Visit: Payer: Self-pay | Admitting: Internal Medicine

## 2016-03-12 ENCOUNTER — Other Ambulatory Visit: Payer: Self-pay | Admitting: Internal Medicine

## 2016-03-14 ENCOUNTER — Telehealth: Payer: Self-pay

## 2016-03-14 NOTE — Telephone Encounter (Signed)
Pt called. I was unable to hear her requests. I tried to call back but no answer.

## 2016-03-17 ENCOUNTER — Other Ambulatory Visit: Payer: Self-pay | Admitting: Internal Medicine

## 2016-03-18 ENCOUNTER — Other Ambulatory Visit (INDEPENDENT_AMBULATORY_CARE_PROVIDER_SITE_OTHER): Payer: Commercial Managed Care - HMO

## 2016-03-18 ENCOUNTER — Other Ambulatory Visit: Payer: Self-pay | Admitting: Internal Medicine

## 2016-03-18 ENCOUNTER — Encounter: Payer: Self-pay | Admitting: Internal Medicine

## 2016-03-18 ENCOUNTER — Ambulatory Visit (INDEPENDENT_AMBULATORY_CARE_PROVIDER_SITE_OTHER): Payer: Commercial Managed Care - HMO | Admitting: Internal Medicine

## 2016-03-18 VITALS — BP 100/60 | HR 62 | Temp 97.4°F | Wt 185.0 lb

## 2016-03-18 DIAGNOSIS — I2510 Atherosclerotic heart disease of native coronary artery without angina pectoris: Secondary | ICD-10-CM | POA: Diagnosis not present

## 2016-03-18 DIAGNOSIS — I2583 Coronary atherosclerosis due to lipid rich plaque: Secondary | ICD-10-CM

## 2016-03-18 DIAGNOSIS — E559 Vitamin D deficiency, unspecified: Secondary | ICD-10-CM | POA: Diagnosis not present

## 2016-03-18 DIAGNOSIS — E119 Type 2 diabetes mellitus without complications: Secondary | ICD-10-CM | POA: Diagnosis not present

## 2016-03-18 DIAGNOSIS — I480 Paroxysmal atrial fibrillation: Secondary | ICD-10-CM

## 2016-03-18 DIAGNOSIS — Z23 Encounter for immunization: Secondary | ICD-10-CM | POA: Diagnosis not present

## 2016-03-18 LAB — BASIC METABOLIC PANEL
BUN: 15 mg/dL (ref 6–23)
CALCIUM: 9.1 mg/dL (ref 8.4–10.5)
CO2: 28 mEq/L (ref 19–32)
Chloride: 103 mEq/L (ref 96–112)
Creatinine, Ser: 1.09 mg/dL (ref 0.40–1.20)
GFR: 62.2 mL/min (ref >60.00)
GLUCOSE: 159 mg/dL — AB (ref 70–99)
POTASSIUM: 4.4 meq/L (ref 3.5–5.1)
SODIUM: 138 meq/L (ref 135–145)

## 2016-03-18 LAB — HEPATIC FUNCTION PANEL
ALK PHOS: 55 U/L (ref 39–117)
ALT: 11 U/L (ref 0–35)
AST: 16 U/L (ref 0–37)
Albumin: 4 g/dL (ref 3.5–5.2)
BILIRUBIN TOTAL: 0.6 mg/dL (ref 0.2–1.2)
Bilirubin, Direct: 0.1 mg/dL (ref 0.0–0.3)
Total Protein: 7 g/dL (ref 6.0–8.3)

## 2016-03-18 LAB — TSH: TSH: 0.81 u[IU]/mL (ref 0.35–4.50)

## 2016-03-18 LAB — HEMOGLOBIN A1C: HEMOGLOBIN A1C: 7.1 % — AB (ref 4.6–6.5)

## 2016-03-18 MED ORDER — ALPRAZOLAM 0.5 MG PO TABS: 0.2500 mg | ORAL_TABLET | Freq: Three times a day (TID) | ORAL | 2 refills | Status: DC | PRN

## 2016-03-18 NOTE — Addendum Note (Signed)
Addended by: Cresenciano Lick on: 03/18/2016 11:53 AM   Modules accepted: Orders

## 2016-03-18 NOTE — Telephone Encounter (Signed)
Please refill.

## 2016-03-18 NOTE — Assessment & Plan Note (Signed)
Toprol, Coumadin

## 2016-03-18 NOTE — Assessment & Plan Note (Signed)
On Vit D 

## 2016-03-18 NOTE — Assessment & Plan Note (Signed)
Amlodipine, Simvastatin, Toprol, Isosorbide

## 2016-03-18 NOTE — Assessment & Plan Note (Signed)
On  Vit D Exercising

## 2016-03-18 NOTE — Telephone Encounter (Signed)
Rx has been sent to the pharmacy electronically. ° °

## 2016-03-18 NOTE — Progress Notes (Signed)
Subjective:  Patient ID: [name], female    DOB: 08-31-1934  Age: 81 y.o. MRN: [id number]  CC: No chief complaint on file.   HPI [name] presents for HTN, CAD, dyslipidemia f/u  Outpatient Medications Prior to Visit  Medication Sig Dispense Refill  . ACCU-CHEK FASTCLIX LANCETS MISC Use to check blood sugars twice a day Dx E11.9 102 each 2  . ALPRAZolam (XANAX) 0.5 MG tablet take 1/2-1 tablet by mouth three times a day if needed 90 tablet 2  . amLODipine (NORVASC) 5 MG tablet take 1 tablet by mouth once daily 30 tablet 3  . aspirin 81 MG tablet Take 81 mg by mouth daily.      . Blood Glucose Monitoring Suppl (ACCU-CHEK AVIVA PLUS) w/Device KIT Use to check blood sugars daily Dx E11.9 1 kit 0  . Cholecalciferol (VITAMIN D) 2000 units tablet take 1 tablet by mouth once daily 100 tablet 2  . Cholecalciferol (VITAMIN D3) 2000 UNITS capsule Take 1 capsule (2,000 Units total) by mouth daily. 100 capsule 3  . clidinium-chlordiazePOXIDE (LIBRAX) 5-2.5 MG capsule Take 1 capsule by mouth 3 (three) times daily as needed (for cramps). 60 capsule 2  . diclofenac sodium (VOLTAREN) 1 % GEL Apply 2 g topically 4 (four) times daily. Apply to left ankle four times per day per Dr. Samara Snide    . fluocinonide cream (LIDEX) 0.05 % Apply twice daily to rash as needed 30 g 5  . furosemide (LASIX) 20 MG tablet Take 1 tablet (20 mg total) by mouth daily as needed for edema. 30 tablet 3  . glucose blood (ACCU-CHEK AVIVA PLUS) test strip 1 each by Other route 2 (two) times daily. Use to check blood sugars twice a day Dx E11.9 100 each 2  . hydroxypropyl methylcellulose (ISOPTO TEARS) 2.5 % ophthalmic solution Place 1 drop into both eyes 2 (two) times daily.      . hydrOXYzine (ATARAX/VISTARIL) 25 MG tablet Take 1 tablet (25 mg total) by mouth every 8 (eight) hours as needed for itching. 60 tablet 0  . isosorbide mononitrate (IMDUR) 30 MG 24 hr tablet take 1 tablet by mouth once daily 30 tablet 10  .  loperamide (IMODIUM A-D) 2 MG tablet Take 1 tablet (2 mg total) by mouth 4 (four) times daily as needed for diarrhea or loose stools. 30 tablet 0  . metoprolol succinate (TOPROL-XL) 25 MG 24 hr tablet Take 1.5 tablets (37.5 mg total) by mouth 2 (two) times daily. 270 tablet 3  . neomycin-polymyxin-hydrocortisone (CORTISPORIN) otic solution Place 4 drops into the left ear 3 (three) times daily. 10 mL 0  . pantoprazole (PROTONIX) 40 MG tablet take 1 tablet by mouth once daily 30 tablet 7  . simvastatin (ZOCOR) 20 MG tablet Take 1 tablet (20 mg total) by mouth at bedtime. 90 tablet 3  . traMADol (ULTRAM) 50 MG tablet Take 1 tablet (50 mg total) by mouth 3 (three) times daily. 90 tablet 2  . warfarin (COUMADIN) 5 MG tablet TAKE 1 TABLET BY MOUTH DAILY OR AS DIRECTED BY COUMADIN CLINIC 30 tablet 5   No facility-administered medications prior to visit.     ROS Review of Systems  Constitutional: Negative for activity change, appetite change, chills, fatigue and unexpected weight change.  HENT: Negative for congestion, mouth sores and sinus pressure.   Eyes: Negative for visual disturbance.  Respiratory: Negative for cough and chest tightness.   Cardiovascular: Negative for leg swelling.  Gastrointestinal: Negative for abdominal  pain and nausea.  Genitourinary: Negative for difficulty urinating, frequency and vaginal pain.  Musculoskeletal: Positive for arthralgias and back pain. Negative for gait problem.  Skin: Negative for pallor and rash.  Neurological: Negative for dizziness, tremors, weakness, numbness and headaches.  Psychiatric/Behavioral: Negative for confusion, sleep disturbance and suicidal ideas. The patient is nervous/anxious.     Objective:  BP 100/60   Pulse 62   Temp 97.4 F (36.3 C) (Oral)   Wt 185 lb (83.9 kg)   SpO2 95%   BMI 29.86 kg/m   BP Readings from Last 3 Encounters:  03/18/16 100/60  11/19/15 124/70  11/13/15 112/62    Wt Readings from Last 3 Encounters:    03/18/16 185 lb (83.9 kg)  11/19/15 184 lb (83.5 kg)  11/13/15 187 lb (84.8 kg)    Physical Exam  Constitutional: She appears well-developed. No distress.  HENT:  Head: Normocephalic.  Right Ear: External ear normal.  Left Ear: External ear normal.  Nose: Nose normal.  Mouth/Throat: Oropharynx is clear and moist.  Eyes: Conjunctivae are normal. Pupils are equal, round, and reactive to light. Right eye exhibits no discharge. Left eye exhibits no discharge.  Neck: Normal range of motion. Neck supple. No JVD present. No tracheal deviation present. No thyromegaly present.  Cardiovascular: Normal rate, regular rhythm and normal heart sounds.   Pulmonary/Chest: No stridor. No respiratory distress. She has no wheezes.  Abdominal: Soft. Bowel sounds are normal. She exhibits no distension and no mass. There is no tenderness. There is no rebound and no guarding.  Musculoskeletal: She exhibits tenderness. She exhibits no edema.  Lymphadenopathy:    She has no cervical adenopathy.  Neurological: She displays normal reflexes. No cranial nerve deficit. She exhibits normal muscle tone. Coordination normal.  Skin: No rash noted. No erythema.  Psychiatric: She has a normal mood and affect. Her behavior is normal. Judgment and thought content normal.  LS sensitive w/ROM  Lab Results  Component Value Date   WBC 4.3 10/11/2015   HGB 12.4 10/11/2015   HCT 36.9 10/11/2015   PLT 222.0 10/11/2015   GLUCOSE 126 (H) 10/11/2015   CHOL 181 10/11/2015   TRIG 88.0 10/11/2015   HDL 65.00 10/11/2015   LDLDIRECT 105.5 11/26/2010   LDLCALC 98 10/11/2015   ALT 9 10/11/2015   AST 13 10/11/2015   NA 137 10/11/2015   K 4.2 10/11/2015   CL 104 10/11/2015   CREATININE 1.02 10/11/2015   BUN 14 10/11/2015   CO2 30 10/11/2015   TSH 1.09 10/11/2015   INR 2.3 02/11/2016   HGBA1C 6.8 (H) 02/08/2015    Mm Digital Screening Bilateral  Result Date: 09/05/2015 CLINICAL DATA:  Screening. EXAM: DIGITAL SCREENING  BILATERAL MAMMOGRAM WITH CAD COMPARISON:  Previous exam(s). ACR Breast Density Category b: There are scattered areas of fibroglandular density. FINDINGS: There are no findings suspicious for malignancy. Images were processed with CAD. IMPRESSION: No mammographic evidence of malignancy. A result letter of this screening mammogram will be mailed directly to the patient. RECOMMENDATION: Screening mammogram in one year. (Code:SM-B-01Y) BI-RADS CATEGORY  1: Negative. Electronically Signed   By: Dalphine Handing M.D.   On: 09/05/2015 15:35    Assessment & Plan:   There are no diagnoses linked to this encounter. I am having Ms. Orvan Falconer maintain her aspirin, hydroxypropyl methylcellulose / hypromellose, diclofenac sodium, fluocinonide cream, loperamide, hydrOXYzine, furosemide, Vitamin D3, simvastatin, ACCU-CHEK FASTCLIX LANCETS, ACCU-CHEK AVIVA PLUS, glucose blood, pantoprazole, metoprolol succinate, warfarin, traMADol, isosorbide mononitrate, clidinium-chlordiazePOXIDE, neomycin-polymyxin-hydrocortisone, Vitamin D,  amLODipine, and ALPRAZolam.  No orders of the defined types were placed in this encounter.    Follow-up: No Follow-up on file.  Walker Kehr, MD

## 2016-03-18 NOTE — Progress Notes (Signed)
Pre visit review using our clinic review tool, if applicable. No additional management support is needed unless otherwise documented below in the visit note. 

## 2016-03-18 NOTE — Telephone Encounter (Signed)
Pt given printed Rx at 03/18/16 OV.

## 2016-03-18 NOTE — Assessment & Plan Note (Signed)
Labs

## 2016-03-24 ENCOUNTER — Ambulatory Visit (INDEPENDENT_AMBULATORY_CARE_PROVIDER_SITE_OTHER): Payer: Commercial Managed Care - HMO | Admitting: Pharmacist Clinician (PhC)/ Clinical Pharmacy Specialist

## 2016-03-24 DIAGNOSIS — I4891 Unspecified atrial fibrillation: Secondary | ICD-10-CM | POA: Diagnosis not present

## 2016-03-24 DIAGNOSIS — Z7901 Long term (current) use of anticoagulants: Secondary | ICD-10-CM | POA: Diagnosis not present

## 2016-03-24 LAB — POCT INR: INR: 2.4

## 2016-03-24 NOTE — Telephone Encounter (Signed)
Pt had appt. Closing this note.

## 2016-04-01 DIAGNOSIS — H35362 Drusen (degenerative) of macula, left eye: Secondary | ICD-10-CM | POA: Diagnosis not present

## 2016-04-01 DIAGNOSIS — H35372 Puckering of macula, left eye: Secondary | ICD-10-CM | POA: Diagnosis not present

## 2016-04-01 DIAGNOSIS — E119 Type 2 diabetes mellitus without complications: Secondary | ICD-10-CM | POA: Diagnosis not present

## 2016-04-01 DIAGNOSIS — H25813 Combined forms of age-related cataract, bilateral: Secondary | ICD-10-CM | POA: Diagnosis not present

## 2016-04-01 DIAGNOSIS — H04123 Dry eye syndrome of bilateral lacrimal glands: Secondary | ICD-10-CM | POA: Diagnosis not present

## 2016-04-02 ENCOUNTER — Telehealth: Payer: Self-pay | Admitting: *Deleted

## 2016-04-02 MED ORDER — ACCU-CHEK AVIVA PLUS W/DEVICE KIT: PACK | 0 refills | Status: DC

## 2016-04-02 MED ORDER — GLUCOSE BLOOD VI STRP: 1.0000 | ORAL_STRIP | Freq: Two times a day (BID) | 2 refills | Status: DC

## 2016-04-02 NOTE — Telephone Encounter (Signed)
Rec'd fax for PA One touch Ultra Strips. It states insurance will not cover preferred Accu-chek plus or True Metrix. Will send Accu-chek monitor w/supplies to [facility]...[name]

## 2016-04-24 ENCOUNTER — Encounter: Payer: Self-pay | Admitting: Internal Medicine

## 2016-04-24 ENCOUNTER — Ambulatory Visit (INDEPENDENT_AMBULATORY_CARE_PROVIDER_SITE_OTHER): Payer: Medicare HMO | Admitting: Pharmacist

## 2016-04-24 ENCOUNTER — Ambulatory Visit (INDEPENDENT_AMBULATORY_CARE_PROVIDER_SITE_OTHER): Payer: Medicare HMO | Admitting: Internal Medicine

## 2016-04-24 VITALS — BP 134/68 | HR 65 | Ht 66.0 in | Wt 187.6 lb

## 2016-04-24 DIAGNOSIS — I471 Supraventricular tachycardia: Secondary | ICD-10-CM | POA: Diagnosis not present

## 2016-04-24 DIAGNOSIS — I4891 Unspecified atrial fibrillation: Secondary | ICD-10-CM | POA: Diagnosis not present

## 2016-04-24 DIAGNOSIS — R0600 Dyspnea, unspecified: Secondary | ICD-10-CM | POA: Diagnosis not present

## 2016-04-24 DIAGNOSIS — Z7901 Long term (current) use of anticoagulants: Secondary | ICD-10-CM | POA: Diagnosis not present

## 2016-04-24 DIAGNOSIS — I10 Essential (primary) hypertension: Secondary | ICD-10-CM

## 2016-04-24 DIAGNOSIS — I071 Rheumatic tricuspid insufficiency: Secondary | ICD-10-CM

## 2016-04-24 DIAGNOSIS — E785 Hyperlipidemia, unspecified: Secondary | ICD-10-CM

## 2016-04-24 LAB — POCT INR: INR: 2.4

## 2016-04-24 NOTE — Patient Instructions (Signed)
Your physician has requested that you have an echocardiogram @ [address] - [address]. Echocardiography is a painless test that uses sound waves to create images of your heart. It provides your doctor with information about the size and shape of your heart and how well your heart's chambers and valves are working. This procedure takes approximately one hour. There are no restrictions for this procedure.  Your physician wants you to follow-up in ONE YEAR with Dr. Hilty.  You will receive a reminder letter in the mail two months in advance. If you don't receive a letter, please call our office to schedule the follow-up appointment.   

## 2016-04-24 NOTE — Progress Notes (Signed)
OFFICE NOTE  Chief Complaint:  No complaints  Primary Care Physician: Sonda Primes, MD  HPI:  [name]  is a 82 year old female with a history of paroxysmal atrial fibrillation and SVT in the past. This happened when she underwent a Myoview for chest pain in the hospital. She was placed on metoprolol succinate 25 b.i.d. and has done well with that with improvement in her palpitations. Her echo showed an EF greater than 55% with some mild aortic insufficiency and an aneurysmal atrial septum. She is on simvastatin as well as Coumadin managed by you. She's recently had some abnormal labs which showed borderline diabetes. She's not currently a medication for that. She reports occasional intermittent chest pain. She underwent a cardia catheterization in the past which showed mild to moderate nonobstructive coronary disease which is managed medically. She does also have bilateral venous insufficiency of the lower chin and knees and has worn compression stockings with improvement in her swelling and symptoms.  [name] returns today without any new complaints. She denies any shortness of breath or chest pain. She does get some occasional chest wall pain and is relieved with tramadol.  I saw [name] back in the office today. Overall she reports she is doing very well. She's had very few episodes of palpitations. She has had a decreased dose in her amlodipine to 5 mg daily, likely due to leg swelling. She is now taking Lasix as needed for edema. She's had a few episodes of racing heart for which she is to perform vagal maneuvers successfully. EKG shows sinus rhythm with PVCs and PACs but no evidence of atrial fibrillation in the office today. She is on warfarin anticoagulation. We will check the INR in the office today.  [name] returns today for follow-up. Unfortunately her son died this past 09-28-2022 of leukemia. She says he's been in and out of the hospital for several  months. She has had it small increase in palpitations recently but seems to be controlled with taking and a half dose of metoprolol. She denies any chest pain although has some pain behind her right shoulder blade. This is intermittent and has improved. Is not associated with exertion or relieved by rest. Her EKG today shows normal sinus rhythm without ischemia. Recent A1c was 6.8-she is not on diabetes medications. I do not see a recent lipid profile though she is on simvastatin 20 mg every OD.  04/24/2016  [name] returns today for follow-up. She has no new complaints. It's been about a year since she had the loss of her son due to leukemia. She seems to be doing fairly well although is routinely very calm and demeanor. Her blood pressure is well-controlled. Her INR was therapeutic today. She denies any worsening swelling, chest pain or shortness of breath. She denies any bleeding problems. She's not had any recurrent atrial fibrillation or palpitations that she is aware of. Her last echo was in 2012 and at the time she had mild nonobstructive coronary disease by catheterization. She was also noted to have moderate tricuspid regurgitation and some LVH. Her EKG today shows moderate LVH by voltage criteria.  PMHx:  Past Medical History:  Diagnosis Date  . Anxiety state, unspecified   . Bronchitis, not specified as acute or chronic   . Diaphragmatic hernia without mention of obstruction or gangrene   . Dyslipidemia   . Essential hypertension   . Irritable bowel syndrome   . Mitral valve disorders(424.0)    a.  08/2010 Echo: EF >55%, mild MR, mod TR, trace AI.  [name] Non-obstructive CAD    a. 07/2010 Lexiscan MV: no ischemia, EF 78%; b. 07/2010 Cath: LM nl, LAD50/60p, 40 apical, LCX nl, RCA dominant, 30/40p, 38m  . Osteoarthrosis, unspecified whether generalized or localized, unspecified site   . Osteoporosis, unspecified   . Other abnormal glucose   . PAF (paroxysmal atrial fibrillation) (HCC)     a. CHA2DS2VASc = 4-->coumadin.  .Marland KitchenPhlebitis and thrombophlebitis of superficial vessels of lower extremities   . PSVT (paroxysmal supraventricular tachycardia) ([facility]    a. 05/2012 Holter: short bursts of PSVT noted-->Managed with toprol.  .Marland KitchenUnspecified venous (peripheral) insufficiency   . Unspecified vitamin D deficiency     Past Surgical History:  Procedure Laterality Date  . CARDIAC CATHETERIZATION  07/30/2010   nonobstructive CAD, 20-40% RCA stenosis, 50-60% eccentric LAD stenosis more prox, 40% LAD stenosis more distal, EF 65%  . TRANSTHORACIC ECHOCARDIOGRAM  09/03/2010   EF=>55%; mild MR; mod TR; AV mildly sclerotic with trace regurg; mild pulm valve regurg  . VESICOVAGINAL FISTULA CLOSURE W/ [facility]      FAMHx:  Family History  Problem Relation Age of Onset  . Stroke Mother   . Stroke Father     also heart disease  . Clotting disorder Paternal Grandmother     blood clot  . Stroke Brother     also heart disease    SOCHx:   reports that she has never smoked. She has never used smokeless tobacco. She reports that she does not drink alcohol or use drugs.  ALLERGIES:  Allergies  Allergen Reactions  . Clarithromycin     REACTION: unspecified  . Fosamax [Alendronate Sodium]     achy  . Guaifenesin Er     itching  . Metformin     REACTION: pt states INTOL to Metformin "felt drained"  . Tape     Adhesive tape---rash    ROS: Pertinent items noted in HPI and remainder of comprehensive ROS otherwise negative.  HOME MEDS: Current Outpatient Prescriptions  Medication Sig Dispense Refill  . ACCU-CHEK FASTCLIX LANCETS MISC Use to check blood sugars twice a day Dx E11.9 102 each 2  . ALPRAZolam (XANAX) 0.5 MG tablet Take 0.5-1 tablets (0.25-0.5 mg total) by mouth 3 (three) times daily as needed for anxiety. 90 tablet 2  . amLODipine (NORVASC) 5 MG tablet take 1 tablet by mouth once daily 30 tablet 3  . aspirin 81 MG tablet Take 81 mg by mouth daily.      . Blood Glucose  Monitoring Suppl (ACCU-CHEK AVIVA PLUS) w/Device KIT Use to check blood sugars twice a day Dx E11.9 1 kit 0  . Cholecalciferol (VITAMIN D) 2000 units tablet take 1 tablet by mouth once daily 100 tablet 2  . Cholecalciferol (VITAMIN D3) 2000 UNITS capsule Take 1 capsule (2,000 Units total) by mouth daily. 100 capsule 3  . clidinium-chlordiazePOXIDE (LIBRAX) 5-2.5 MG capsule Take 1 capsule by mouth 3 (three) times daily as needed (for cramps). 60 capsule 2  . diclofenac sodium (VOLTAREN) 1 % GEL Apply 2 g topically 4 (four) times daily. Apply to left ankle four times per day per Dr. HSamara Snide   . fluocinonide cream (LIDEX) 0.05 % Apply twice daily to rash as needed 30 g 5  . furosemide (LASIX) 20 MG tablet Take 1 tablet (20 mg total) by mouth daily as needed for edema. 30 tablet 3  . glucose blood (ACCU-CHEK AVIVA PLUS) test strip 1  each by Other route 2 (two) times daily. Use to check blood sugars twice a day. Dx E11.9    . glucose blood (ACCU-CHEK AVIVA PLUS) test strip 1 each by Other route 2 (two) times daily. Use to check blood sugars twice a day Dx E11.9 100 each 2  . hydroxypropyl methylcellulose (ISOPTO TEARS) 2.5 % ophthalmic solution Place 1 drop into both eyes 2 (two) times daily.      . hydrOXYzine (ATARAX/VISTARIL) 25 MG tablet Take 1 tablet (25 mg total) by mouth every 8 (eight) hours as needed for itching. 60 tablet 0  . isosorbide mononitrate (IMDUR) 30 MG 24 hr tablet take 1 tablet by mouth once daily 30 tablet 10  . loperamide (IMODIUM A-D) 2 MG tablet Take 1 tablet (2 mg total) by mouth 4 (four) times daily as needed for diarrhea or loose stools. 30 tablet 0  . metoprolol succinate (TOPROL-XL) 25 MG 24 hr tablet Take 1.5 tablets (37.5 mg total) by mouth 2 (two) times daily. 270 tablet 3  . neomycin-polymyxin-hydrocortisone (CORTISPORIN) otic solution Place 4 drops into the left ear 3 (three) times daily. 10 mL 0  . pantoprazole (PROTONIX) 40 MG tablet take 1 tablet by mouth once daily  30 tablet 3  . simvastatin (ZOCOR) 20 MG tablet Take 1 tablet (20 mg total) by mouth at bedtime. 90 tablet 3  . traMADol (ULTRAM) 50 MG tablet Take 1 tablet (50 mg total) by mouth 3 (three) times daily. 90 tablet 2  . warfarin (COUMADIN) 5 MG tablet take 1 tablet by mouth once daily or as directed BY COUMADIN CLINIC 30 tablet 5   No current facility-administered medications for this visit.     LABS/IMAGING: No results found for this or any previous visit (from the past 48 hour(s)). No results found.  VITALS: BP 134/68   Pulse 65   Ht '5\' 6"'$  (1.676 m)   Wt 187 lb 9.6 oz (85.1 kg)   BMI 30.28 kg/m   EXAM: General appearance: alert and no distress Neck: no carotid bruit and no JVD Lungs: clear to auscultation bilaterally Heart: regular rate and rhythm, S1, S2 normal, 2/6 SEM at apex, no click, rub or gallop Abdomen: soft, non-tender; bowel sounds normal; no masses,  no organomegaly and overweight Extremities: extremities normal, atraumatic, no cyanosis or edema Pulses: 2+ and symmetric Skin: Skin color, texture, turgor normal. No rashes or lesions Neurologic: Grossly normal Psych: Mood, affect normal  EKG: Normal sinus rhythm at 65, moderate voltage criteria for LVH  ASSESSMENT: 1. Paroxysmal atrial fibrillation on warfarin - CHADSVASC score of 4 2. Hypertension-controlled 3. Dyslipidemia-on statin 4. Overweight 5. Moderate, non-obstructive CAD by cath in 2012 6. PSVT - controlled by b-blocker and vagal maneuvers 7. LVH, mild to moderate TR by echo 2012  PLAN: 1.   [name] seems to be doing well without recurrent atrial fibrillation or PSVT. Recently her beta blockers were increased which seems to be helping her symptoms. She has a CHADSVASC score for his been anticoagulated on warfarin. We discussed a novel oral anticoagulant however she remains on warfarin by her choice. INR has been therapeutic and she's checked every 6 weeks. Blood pressures controlled. She is on a  statin. Cholesterol is at goal. She has some new changes including worsening voltages on her EKG suggesting possible LVH. She did have a history of moderate moderate TR and some structural changes in her heart in 2012. She's not had an echo since that time. She does get a  little dyspneic and I like to repeat an echo to further assess this.  Plan to see her back annually or sooner as necessary.  Pixie Casino, MD, [facility] Attending Cardiologist [place] [facility] Hashim Eichhorst 04/24/2016, 12:57 PM

## 2016-05-03 ENCOUNTER — Other Ambulatory Visit: Payer: Self-pay | Admitting: Internal Medicine

## 2016-05-08 ENCOUNTER — Other Ambulatory Visit: Payer: Self-pay | Admitting: Internal Medicine

## 2016-05-13 ENCOUNTER — Other Ambulatory Visit (HOSPITAL_COMMUNITY): Payer: Commercial Managed Care - HMO

## 2016-05-16 ENCOUNTER — Other Ambulatory Visit: Payer: Self-pay | Admitting: Internal Medicine

## 2016-05-19 ENCOUNTER — Ambulatory Visit (HOSPITAL_COMMUNITY): Payer: Medicare HMO | Attending: Cardiology

## 2016-05-19 ENCOUNTER — Other Ambulatory Visit: Payer: Self-pay

## 2016-05-19 DIAGNOSIS — R0600 Dyspnea, unspecified: Secondary | ICD-10-CM | POA: Diagnosis not present

## 2016-05-19 DIAGNOSIS — I501 Left ventricular failure: Secondary | ICD-10-CM | POA: Insufficient documentation

## 2016-05-19 DIAGNOSIS — I071 Rheumatic tricuspid insufficiency: Secondary | ICD-10-CM

## 2016-05-19 DIAGNOSIS — I4891 Unspecified atrial fibrillation: Secondary | ICD-10-CM | POA: Diagnosis not present

## 2016-05-19 NOTE — Telephone Encounter (Signed)
Rec'd call from pt requesting status on refilling her Tramadol she states pharmacy sent request 4 days ago. Pls advise if ok to fill...[name]

## 2016-05-20 NOTE — Telephone Encounter (Signed)
[facility] still says they do not have this med

## 2016-05-21 NOTE — Telephone Encounter (Signed)
Called refill into pharmacy spoke w/ pharmacist Merrilee Seashore gave MD approval.../lmb

## 2016-05-22 ENCOUNTER — Other Ambulatory Visit: Payer: Self-pay | Admitting: Internal Medicine

## 2016-06-03 ENCOUNTER — Ambulatory Visit (INDEPENDENT_AMBULATORY_CARE_PROVIDER_SITE_OTHER): Payer: Medicare HMO | Admitting: Pharmacist Clinician (PhC)/ Clinical Pharmacy Specialist

## 2016-06-03 DIAGNOSIS — I4891 Unspecified atrial fibrillation: Secondary | ICD-10-CM | POA: Diagnosis not present

## 2016-06-03 DIAGNOSIS — Z7901 Long term (current) use of anticoagulants: Secondary | ICD-10-CM | POA: Diagnosis not present

## 2016-06-03 LAB — POCT INR: INR: 2.4

## 2016-06-27 ENCOUNTER — Other Ambulatory Visit: Payer: Self-pay | Admitting: Internal Medicine

## 2016-06-30 NOTE — Telephone Encounter (Signed)
Rx(s) sent to pharmacy electronically.  

## 2016-07-07 ENCOUNTER — Other Ambulatory Visit: Payer: Self-pay | Admitting: Internal Medicine

## 2016-07-24 ENCOUNTER — Ambulatory Visit (INDEPENDENT_AMBULATORY_CARE_PROVIDER_SITE_OTHER): Payer: Medicare HMO | Admitting: Pharmacist Clinician (PhC)/ Clinical Pharmacy Specialist

## 2016-07-24 DIAGNOSIS — I4891 Unspecified atrial fibrillation: Secondary | ICD-10-CM | POA: Diagnosis not present

## 2016-07-24 DIAGNOSIS — Z7901 Long term (current) use of anticoagulants: Secondary | ICD-10-CM

## 2016-07-24 LAB — POCT INR: INR: 3.6

## 2016-08-02 ENCOUNTER — Other Ambulatory Visit: Payer: Self-pay | Admitting: Internal Medicine

## 2016-08-07 ENCOUNTER — Other Ambulatory Visit: Payer: Self-pay | Admitting: Internal Medicine

## 2016-08-07 DIAGNOSIS — Z1231 Encounter for screening mammogram for malignant neoplasm of breast: Secondary | ICD-10-CM

## 2016-08-14 ENCOUNTER — Ambulatory Visit (INDEPENDENT_AMBULATORY_CARE_PROVIDER_SITE_OTHER): Payer: Medicare HMO | Admitting: Pharmacist

## 2016-08-14 DIAGNOSIS — Z7901 Long term (current) use of anticoagulants: Secondary | ICD-10-CM

## 2016-08-14 DIAGNOSIS — I4891 Unspecified atrial fibrillation: Secondary | ICD-10-CM | POA: Diagnosis not present

## 2016-08-14 LAB — POCT INR: INR: 2.8

## 2016-09-05 ENCOUNTER — Ambulatory Visit
Admission: RE | Admit: 2016-09-05 | Discharge: 2016-09-05 | Disposition: A | Discharge status: Home / Self Care | Payer: Medicare HMO | Source: Ambulatory Visit | Attending: Internal Medicine | Admitting: Internal Medicine

## 2016-09-05 DIAGNOSIS — Z1231 Encounter for screening mammogram for malignant neoplasm of breast: Secondary | ICD-10-CM

## 2016-09-16 ENCOUNTER — Encounter: Payer: Self-pay | Admitting: Internal Medicine

## 2016-09-16 ENCOUNTER — Other Ambulatory Visit (INDEPENDENT_AMBULATORY_CARE_PROVIDER_SITE_OTHER): Payer: Medicare HMO

## 2016-09-16 ENCOUNTER — Other Ambulatory Visit: Payer: Self-pay | Admitting: Nurse Practitioner

## 2016-09-16 ENCOUNTER — Ambulatory Visit (INDEPENDENT_AMBULATORY_CARE_PROVIDER_SITE_OTHER): Payer: Medicare HMO | Admitting: Internal Medicine

## 2016-09-16 VITALS — BP 130/72 | HR 57 | Temp 98.0°F | Ht 66.0 in | Wt 187.0 lb

## 2016-09-16 DIAGNOSIS — I480 Paroxysmal atrial fibrillation: Secondary | ICD-10-CM

## 2016-09-16 DIAGNOSIS — Z0000 Encounter for general adult medical examination without abnormal findings: Secondary | ICD-10-CM | POA: Diagnosis not present

## 2016-09-16 DIAGNOSIS — F411 Generalized anxiety disorder: Secondary | ICD-10-CM

## 2016-09-16 DIAGNOSIS — E119 Type 2 diabetes mellitus without complications: Secondary | ICD-10-CM | POA: Diagnosis not present

## 2016-09-16 DIAGNOSIS — I10 Essential (primary) hypertension: Secondary | ICD-10-CM | POA: Diagnosis not present

## 2016-09-16 LAB — BASIC METABOLIC PANEL
BUN: 14 mg/dL (ref 6–23)
CO2: 29 mEq/L (ref 19–32)
Calcium: 9.5 mg/dL (ref 8.4–10.5)
Chloride: 103 mEq/L (ref 96–112)
Creatinine, Ser: 1.01 mg/dL (ref 0.40–1.20)
GFR: 67.83 mL/min (ref >60.00)
Glucose, Bld: 120 mg/dL — ABNORMAL HIGH (ref 70–99)
POTASSIUM: 4.3 meq/L (ref 3.5–5.1)
SODIUM: 137 meq/L (ref 135–145)

## 2016-09-16 LAB — LIPID PANEL
CHOL/HDL RATIO: 2
CHOLESTEROL: 177 mg/dL (ref 0–200)
HDL: 70.9 mg/dL (ref >39.00)
LDL CALC: 89 mg/dL (ref 0–99)
NonHDL: 106.01
Triglycerides: 85 mg/dL (ref 0.0–149.0)
VLDL: 17 mg/dL (ref 0.0–40.0)

## 2016-09-16 LAB — URINALYSIS, ROUTINE W REFLEX MICROSCOPIC
BILIRUBIN URINE: NEGATIVE
KETONES UR: NEGATIVE
LEUKOCYTES UA: NEGATIVE
Nitrite: NEGATIVE
Specific Gravity, Urine: <=1.005 — AB (ref 1.000–1.030)
Total Protein, Urine: NEGATIVE
UROBILINOGEN UA: 0.2 (ref 0.0–1.0)
Urine Glucose: NEGATIVE
pH: 7 (ref 5.0–8.0)

## 2016-09-16 LAB — CBC WITH DIFFERENTIAL/PLATELET
Basophils Absolute: 0 10*3/uL (ref 0.0–0.1)
Basophils Relative: 0.7 % (ref 0.0–3.0)
EOS PCT: 2 % (ref 0.0–5.0)
Eosinophils Absolute: 0.1 10*3/uL (ref 0.0–0.7)
HCT: 37.3 % (ref 36.0–46.0)
Hemoglobin: 12.5 g/dL (ref 12.0–15.0)
Lymphocytes Relative: 33.4 % (ref 12.0–46.0)
Lymphs Abs: 1.5 10*3/uL (ref 0.7–4.0)
MCHC: 33.5 g/dL (ref 30.0–36.0)
MCV: 87.3 fl (ref 78.0–100.0)
MONO ABS: 0.4 10*3/uL (ref 0.1–1.0)
MONOS PCT: 9.3 % (ref 3.0–12.0)
Neutro Abs: 2.5 10*3/uL (ref 1.4–7.7)
Neutrophils Relative %: 54.6 % (ref 43.0–77.0)
Platelets: 217 10*3/uL (ref 150.0–400.0)
RBC: 4.28 Mil/uL (ref 3.87–5.11)
RDW: 14 % (ref 11.5–15.5)
WBC: 4.6 10*3/uL (ref 4.0–10.5)

## 2016-09-16 LAB — HEPATIC FUNCTION PANEL
ALT: 11 U/L (ref 0–35)
AST: 16 U/L (ref 0–37)
Albumin: 4.4 g/dL (ref 3.5–5.2)
Alkaline Phosphatase: 57 U/L (ref 39–117)
BILIRUBIN TOTAL: 0.5 mg/dL (ref 0.2–1.2)
Bilirubin, Direct: 0.1 mg/dL (ref 0.0–0.3)
Total Protein: 7.3 g/dL (ref 6.0–8.3)

## 2016-09-16 LAB — TSH: TSH: 1 u[IU]/mL (ref 0.35–4.50)

## 2016-09-16 MED ORDER — ALPRAZOLAM 0.5 MG PO TABS: 0.2500 mg | ORAL_TABLET | Freq: Three times a day (TID) | ORAL | 2 refills | Status: DC | PRN

## 2016-09-16 NOTE — Assessment & Plan Note (Signed)
Coumadin, Toprol

## 2016-09-16 NOTE — Progress Notes (Signed)
Subjective:  Patient ID: [name], female    DOB: February 17, 1935  Age: 82 y.o. MRN: [id number]  CC: No chief complaint on file.   HPI SHIMEKA BACOT presents for a well exam F/u A fib, anxiety, HTN  Outpatient Medications Prior to Visit  Medication Sig Dispense Refill  . ACCU-CHEK FASTCLIX LANCETS MISC Use to check blood sugars twice a day Dx E11.9 102 each 2  . ALPRAZolam (XANAX) 0.5 MG tablet Take 0.5-1 tablets (0.25-0.5 mg total) by mouth 3 (three) times daily as needed for anxiety. 90 tablet 2  . amLODipine (NORVASC) 5 MG tablet take 1 tablet by mouth once daily 120 tablet 2  . aspirin 81 MG tablet Take 81 mg by mouth daily.      . Blood Glucose Monitoring Suppl (ACCU-CHEK AVIVA PLUS) w/Device KIT Use to check blood sugars twice a day Dx E11.9 1 kit 0  . Cholecalciferol (VITAMIN D) 2000 units tablet take 1 tablet by mouth once daily 100 tablet 2  . Cholecalciferol (VITAMIN D3) 2000 UNITS capsule Take 1 capsule (2,000 Units total) by mouth daily. 100 capsule 3  . clidinium-chlordiazePOXIDE (LIBRAX) 5-2.5 MG capsule Take 1 capsule by mouth 3 (three) times daily as needed (for cramps). 60 capsule 2  . diclofenac sodium (VOLTAREN) 1 % GEL Apply 2 g topically 4 (four) times daily. Apply to left ankle four times per day per Dr. Samara Snide    . fluocinonide cream (LIDEX) 0.05 % Apply twice daily to rash as needed 30 g 5  . furosemide (LASIX) 20 MG tablet Take 1 tablet (20 mg total) by mouth daily as needed for edema. 30 tablet 3  . glucose blood (ACCU-CHEK AVIVA PLUS) test strip 1 each by Other route 2 (two) times daily. Use to check blood sugars twice a day Dx E11.9 100 each 2  . hydroxypropyl methylcellulose (ISOPTO TEARS) 2.5 % ophthalmic solution Place 1 drop into both eyes 2 (two) times daily.      . hydrOXYzine (ATARAX/VISTARIL) 25 MG tablet Take 1 tablet (25 mg total) by mouth every 8 (eight) hours as needed for itching. 60 tablet 0  . isosorbide mononitrate (IMDUR) 30 MG 24 hr  tablet take 1 tablet by mouth once daily 30 tablet 9  . loperamide (IMODIUM A-D) 2 MG tablet Take 1 tablet (2 mg total) by mouth 4 (four) times daily as needed for diarrhea or loose stools. 30 tablet 0  . metoprolol succinate (TOPROL-XL) 25 MG 24 hr tablet Take 1.5 tablets (37.5 mg total) by mouth 2 (two) times daily. 270 tablet 3  . neomycin-polymyxin-hydrocortisone (CORTISPORIN) otic solution Place 4 drops into the left ear 3 (three) times daily. 10 mL 0  . pantoprazole (PROTONIX) 40 MG tablet take 1 tablet by mouth once daily 90 tablet 3  . simvastatin (ZOCOR) 20 MG tablet take 1 tablet by mouth at bedtime 90 tablet 1  . traMADol (ULTRAM) 50 MG tablet take 1 tablet by mouth three times a day 90 tablet 2  . warfarin (COUMADIN) 5 MG tablet take 1 tablet by mouth once daily or as directed BY COUMADIN CLINIC 30 tablet 5  . glucose blood (ACCU-CHEK AVIVA PLUS) test strip 1 each by Other route 2 (two) times daily. Use to check blood sugars twice a day. Dx E11.9     No facility-administered medications prior to visit.     ROS Review of Systems  Constitutional: Negative for activity change, appetite change, chills, fatigue and unexpected weight change.  HENT: Positive for tinnitus. Negative for congestion, mouth sores and sinus pressure.   Eyes: Negative for visual disturbance.  Respiratory: Negative for cough and chest tightness.   Gastrointestinal: Negative for abdominal pain and nausea.  Genitourinary: Negative for difficulty urinating, frequency and vaginal pain.  Musculoskeletal: Negative for back pain and gait problem.  Skin: Negative for pallor and rash.  Neurological: Negative for dizziness, tremors, weakness, numbness and headaches.  Psychiatric/Behavioral: Negative for confusion and sleep disturbance.    Objective:  BP 130/72 (BP Location: Left Arm, Patient Position: Sitting, Cuff Size: Large)   Pulse (!) 57   Temp 98 F (36.7 C) (Oral)   Ht '5\' 6"'$  (1.676 m)   Wt 187 lb (84.8 kg)    SpO2 99%   BMI 30.18 kg/m   BP Readings from Last 3 Encounters:  09/16/16 130/72  04/24/16 134/68  03/18/16 100/60    Wt Readings from Last 3 Encounters:  09/16/16 187 lb (84.8 kg)  04/24/16 187 lb 9.6 oz (85.1 kg)  03/18/16 185 lb (83.9 kg)    Physical Exam  Constitutional: She appears well-developed. No distress.  HENT:  Head: Normocephalic.  Right Ear: External ear normal.  Left Ear: External ear normal.  Nose: Nose normal.  Mouth/Throat: Oropharynx is clear and moist.  Eyes: Conjunctivae are normal. Pupils are equal, round, and reactive to light. Right eye exhibits no discharge. Left eye exhibits no discharge.  Neck: Normal range of motion. Neck supple. No JVD present. No tracheal deviation present. No thyromegaly present.  Cardiovascular: Normal rate, regular rhythm and normal heart sounds.   Pulmonary/Chest: No stridor. No respiratory distress. She has no wheezes.  Abdominal: Soft. Bowel sounds are normal. She exhibits no distension and no mass. There is no tenderness. There is no rebound and no guarding.  Musculoskeletal: She exhibits no edema or tenderness.  Lymphadenopathy:    She has no cervical adenopathy.  Neurological: She displays normal reflexes. No cranial nerve deficit. She exhibits normal muscle tone. Coordination normal.  Skin: No rash noted. No erythema.  Psychiatric: She has a normal mood and affect. Her behavior is normal. Judgment and thought content normal.    Lab Results  Component Value Date   WBC 4.3 10/11/2015   HGB 12.4 10/11/2015   HCT 36.9 10/11/2015   PLT 222.0 10/11/2015   GLUCOSE 159 (H) 03/18/2016   CHOL 181 10/11/2015   TRIG 88.0 10/11/2015   HDL 65.00 10/11/2015   LDLDIRECT 105.5 11/26/2010   LDLCALC 98 10/11/2015   ALT 11 03/18/2016   AST 16 03/18/2016   NA 138 03/18/2016   K 4.4 03/18/2016   CL 103 03/18/2016   CREATININE 1.09 03/18/2016   BUN 15 03/18/2016   CO2 28 03/18/2016   TSH 0.81 03/18/2016   INR 2.8  08/14/2016   HGBA1C 7.1 (H) 03/18/2016    Mm Screening Breast Tomo Bilateral  Result Date: 09/09/2016 CLINICAL DATA:  Screening. EXAM: 2D DIGITAL SCREENING BILATERAL MAMMOGRAM WITH CAD AND ADJUNCT TOMO COMPARISON:  Previous exam(s). ACR Breast Density Category b: There are scattered areas of fibroglandular density. FINDINGS: There are no findings suspicious for malignancy. Images were processed with CAD. IMPRESSION: No mammographic evidence of malignancy. A result letter of this screening mammogram will be mailed directly to the patient. RECOMMENDATION: Screening mammogram in one year. (Code:[id number]-01Y) BI-RADS CATEGORY  1: Negative. Electronically Signed   By: Everlean Alstrom M.D.   On: 09/09/2016 15:06    Assessment & Plan:   There are no diagnoses linked  to this encounter. I am having Ms. Megan Salon maintain her aspirin, hydroxypropyl methylcellulose / hypromellose, diclofenac sodium, fluocinonide cream, loperamide, hydrOXYzine, furosemide, Vitamin D3, ACCU-CHEK FASTCLIX LANCETS, metoprolol succinate, clidinium-chlordiazePOXIDE, neomycin-polymyxin-hydrocortisone, Vitamin D, warfarin, ALPRAZolam, glucose blood, ACCU-CHEK AVIVA PLUS, traMADol, simvastatin, pantoprazole, amLODipine, and isosorbide mononitrate.  No orders of the defined types were placed in this encounter.    Follow-up: No Follow-up on file.  Walker Kehr, MD

## 2016-09-16 NOTE — Assessment & Plan Note (Signed)
Labs

## 2016-09-16 NOTE — Assessment & Plan Note (Signed)
Here for medicare wellness/physical  Diet: heart healthy  Physical activity: not sedentary  Depression/mood screen: negative  Hearing: intact to whispered voice  Visual acuity: grossly normal w/glasses, performs annual eye exam  ADLs: capable  Fall risk: low  Home safety: good  Cognitive evaluation: intact to orientation, naming, recall and repetition  EOL planning: adv directives, full code/ I agree  I have personally reviewed and have noted  1. The patient's medical, surgical and social history  2. Their use of alcohol, tobacco or illicit drugs  3. Their current medications and supplements  4. The patient's functional ability including ADL's, fall risks, home safety risks and hearing or visual impairment.  5. Diet and physical activities  6. Evidence for depression or mood disorders 7. The roster of all physicians providing medical care to patient - is listed in the Snapshot section of the chart and reviewed today.    Today patient counseled on age appropriate routine health concerns for screening and prevention, each reviewed and up to date or declined. Immunizations reviewed and up to date or declined. Labs ordered and reviewed. Risk factors for depression reviewed and negative. Hearing function and visual acuity are intact. ADLs screened and addressed as needed. Functional ability and level of safety reviewed and appropriate. Education, counseling and referrals performed based on assessed risks today. Patient provided with a copy of personalized plan for preventive services.  Had a mammo Pt declined all vaccines

## 2016-09-16 NOTE — Assessment & Plan Note (Signed)
Xanax prn 

## 2016-09-16 NOTE — Assessment & Plan Note (Signed)
Toprol, Norvasc, Lasix

## 2016-09-16 NOTE — Patient Instructions (Signed)
Health Maintenance for Postmenopausal Women Menopause is a normal process in which your reproductive ability comes to an end. This process happens gradually over a span of months to years, usually between the ages of 22 and 9. Menopause is complete when you have missed 12 consecutive menstrual periods. It is important to talk with your health care provider about some of the most common conditions that affect postmenopausal women, such as heart disease, cancer, and bone loss (osteoporosis). Adopting a healthy lifestyle and getting preventive care can help to promote your health and wellness. Those actions can also lower your chances of developing some of these common conditions. What should I know about menopause? During menopause, you may experience a number of symptoms, such as:  Moderate-to-severe hot flashes.  Night sweats.  Decrease in sex drive.  Mood swings.  Headaches.  Tiredness.  Irritability.  Memory problems.  Insomnia.  Choosing to treat or not to treat menopausal changes is an individual decision that you make with your health care provider. What should I know about hormone replacement therapy and supplements? Hormone therapy products are effective for treating symptoms that are associated with menopause, such as hot flashes and night sweats. Hormone replacement carries certain risks, especially as you become older. If you are thinking about using estrogen or estrogen with progestin treatments, discuss the benefits and risks with your health care provider. What should I know about heart disease and stroke? Heart disease, heart attack, and stroke become more likely as you age. This may be due, in part, to the hormonal changes that your body experiences during menopause. These can affect how your body processes dietary fats, triglycerides, and cholesterol. Heart attack and stroke are both medical emergencies. There are many things that you can do to help prevent heart disease  and stroke:  Have your blood pressure checked at least every 1-2 years. High blood pressure causes heart disease and increases the risk of stroke.  If you are 53-22 years old, ask your health care provider if you should take aspirin to prevent a heart attack or a stroke.  Do not use any tobacco products, including cigarettes, chewing tobacco, or electronic cigarettes. If you need help quitting, ask your health care provider.  It is important to eat a healthy diet and maintain a healthy weight. ? Be sure to include plenty of vegetables, fruits, low-fat dairy products, and lean protein. ? Avoid eating foods that are high in solid fats, added sugars, or salt (sodium).  Get regular exercise. This is one of the most important things that you can do for your health. ? Try to exercise for at least 150 minutes each week. The type of exercise that you do should increase your heart rate and make you sweat. This is known as moderate-intensity exercise. ? Try to do strengthening exercises at least twice each week. Do these in addition to the moderate-intensity exercise.  Know your numbers.Ask your health care provider to check your cholesterol and your blood glucose. Continue to have your blood tested as directed by your health care provider.  What should I know about cancer screening? There are several types of cancer. Take the following steps to reduce your risk and to catch any cancer development as early as possible. Breast Cancer  Practice breast self-awareness. ? This means understanding how your breasts normally appear and feel. ? It also means doing regular breast self-exams. Let your health care provider know about any changes, no matter how small.  If you are 40  or older, have a clinician do a breast exam (clinical breast exam or CBE) every year. Depending on your age, family history, and medical history, it may be recommended that you also have a yearly breast X-ray (mammogram).  If you  have a family history of breast cancer, talk with your health care provider about genetic screening.  If you are at high risk for breast cancer, talk with your health care provider about having an MRI and a mammogram every year.  Breast cancer (BRCA) gene test is recommended for women who have family members with BRCA-related cancers. Results of the assessment will determine the need for genetic counseling and BRCA1 and for BRCA2 testing. BRCA-related cancers include these types: ? Breast. This occurs in males or females. ? Ovarian. ? Tubal. This may also be called fallopian tube cancer. ? Cancer of the abdominal or pelvic lining (peritoneal cancer). ? Prostate. ? Pancreatic.  Cervical, Uterine, and Ovarian Cancer Your health care provider may recommend that you be screened regularly for cancer of the pelvic organs. These include your ovaries, uterus, and vagina. This screening involves a pelvic exam, which includes checking for microscopic changes to the surface of your cervix (Pap test).  For women ages 21-65, health care providers may recommend a pelvic exam and a Pap test every three years. For women ages 79-65, they may recommend the Pap test and pelvic exam, combined with testing for human papilloma virus (HPV), every five years. Some types of HPV increase your risk of cervical cancer. Testing for HPV may also be done on women of any age who have unclear Pap test results.  Other health care providers may not recommend any screening for nonpregnant women who are considered low risk for pelvic cancer and have no symptoms. Ask your health care provider if a screening pelvic exam is right for you.  If you have had past treatment for cervical cancer or a condition that could lead to cancer, you need Pap tests and screening for cancer for at least 20 years after your treatment. If Pap tests have been discontinued for you, your risk factors (such as having a new sexual partner) need to be  reassessed to determine if you should start having screenings again. Some women have medical problems that increase the chance of getting cervical cancer. In these cases, your health care provider may recommend that you have screening and Pap tests more often.  If you have a family history of uterine cancer or ovarian cancer, talk with your health care provider about genetic screening.  If you have vaginal bleeding after reaching menopause, tell your health care provider.  There are currently no reliable tests available to screen for ovarian cancer.  Lung Cancer Lung cancer screening is recommended for adults 69-62 years old who are at high risk for lung cancer because of a history of smoking. A yearly low-dose CT scan of the lungs is recommended if you:  Currently smoke.  Have a history of at least 30 pack-years of smoking and you currently smoke or have quit within the past 15 years. A pack-year is smoking an average of one pack of cigarettes per day for one year.  Yearly screening should:  Continue until it has been 15 years since you quit.  Stop if you develop a health problem that would prevent you from having lung cancer treatment.  Colorectal Cancer  This type of cancer can be detected and can often be prevented.  Routine colorectal cancer screening usually begins at  age 42 and continues through age 45.  If you have risk factors for colon cancer, your health care provider may recommend that you be screened at an earlier age.  If you have a family history of colorectal cancer, talk with your health care provider about genetic screening.  Your health care provider may also recommend using home test kits to check for hidden blood in your stool.  A small camera at the end of a tube can be used to examine your colon directly (sigmoidoscopy or colonoscopy). This is done to check for the earliest forms of colorectal cancer.  Direct examination of the colon should be repeated every  5-10 years until age 71. However, if early forms of precancerous polyps or small growths are found or if you have a family history or genetic risk for colorectal cancer, you may need to be screened more often.  Skin Cancer  Check your skin from head to toe regularly.  Monitor any moles. Be sure to tell your health care provider: ? About any new moles or changes in moles, especially if there is a change in a mole's shape or color. ? If you have a mole that is larger than the size of a pencil eraser.  If any of your family members has a history of skin cancer, especially at a young age, talk with your health care provider about genetic screening.  Always use sunscreen. Apply sunscreen liberally and repeatedly throughout the day.  Whenever you are outside, protect yourself by wearing long sleeves, pants, a wide-brimmed hat, and sunglasses.  What should I know about osteoporosis? Osteoporosis is a condition in which bone destruction happens more quickly than new bone creation. After menopause, you may be at an increased risk for osteoporosis. To help prevent osteoporosis or the bone fractures that can happen because of osteoporosis, the following is recommended:  If you are 46-71 years old, get at least 1,000 mg of calcium and at least 600 mg of vitamin D per day.  If you are older than age 55 but younger than age 65, get at least 1,200 mg of calcium and at least 600 mg of vitamin D per day.  If you are older than age 54, get at least 1,200 mg of calcium and at least 800 mg of vitamin D per day.  Smoking and excessive alcohol intake increase the risk of osteoporosis. Eat foods that are rich in calcium and vitamin D, and do weight-bearing exercises several times each week as directed by your health care provider. What should I know about how menopause affects my mental health? Depression may occur at any age, but it is more common as you become older. Common symptoms of depression  include:  Low or sad mood.  Changes in sleep patterns.  Changes in appetite or eating patterns.  Feeling an overall lack of motivation or enjoyment of activities that you previously enjoyed.  Frequent crying spells.  Talk with your health care provider if you think that you are experiencing depression. What should I know about immunizations? It is important that you get and maintain your immunizations. These include:  Tetanus, diphtheria, and pertussis (Tdap) booster vaccine.  Influenza every year before the flu season begins.  Pneumonia vaccine.  Shingles vaccine.  Your health care provider may also recommend other immunizations. This information is not intended to replace advice given to you by your health care provider. Make sure you discuss any questions you have with your health care provider. Document Released: 05/23/2005  Document Revised: 10/19/2015 Document Reviewed: 01/02/2015 Elsevier Interactive Patient Education  2018 [employer]  

## 2016-09-22 DIAGNOSIS — H02831 Dermatochalasis of right upper eyelid: Secondary | ICD-10-CM | POA: Diagnosis not present

## 2016-09-22 DIAGNOSIS — H10413 Chronic giant papillary conjunctivitis, bilateral: Secondary | ICD-10-CM | POA: Diagnosis not present

## 2016-09-22 DIAGNOSIS — H1132 Conjunctival hemorrhage, left eye: Secondary | ICD-10-CM | POA: Diagnosis not present

## 2016-09-22 DIAGNOSIS — H04123 Dry eye syndrome of bilateral lacrimal glands: Secondary | ICD-10-CM | POA: Diagnosis not present

## 2016-09-22 DIAGNOSIS — H02834 Dermatochalasis of left upper eyelid: Secondary | ICD-10-CM | POA: Diagnosis not present

## 2016-09-22 DIAGNOSIS — H25813 Combined forms of age-related cataract, bilateral: Secondary | ICD-10-CM | POA: Diagnosis not present

## 2016-09-23 ENCOUNTER — Telehealth: Payer: Self-pay | Admitting: Internal Medicine

## 2016-09-23 NOTE — Telephone Encounter (Signed)
Submitted PA for metoprolol succinate 25mg  1.5 tablets PO BID via [email or web address] Key: [id number]

## 2016-09-24 NOTE — Telephone Encounter (Signed)
Received fax from [facility] that patient's metoprolol succinate is approved #[id number] for 90 days until 09/24/2017

## 2016-09-25 ENCOUNTER — Ambulatory Visit (INDEPENDENT_AMBULATORY_CARE_PROVIDER_SITE_OTHER): Payer: Medicare HMO | Admitting: Pharmacist

## 2016-09-25 DIAGNOSIS — Z7901 Long term (current) use of anticoagulants: Secondary | ICD-10-CM

## 2016-09-25 DIAGNOSIS — I4891 Unspecified atrial fibrillation: Secondary | ICD-10-CM | POA: Diagnosis not present

## 2016-09-25 LAB — POCT INR: INR: 3

## 2016-11-04 ENCOUNTER — Ambulatory Visit (INDEPENDENT_AMBULATORY_CARE_PROVIDER_SITE_OTHER): Payer: Medicare HMO | Admitting: Pharmacist Clinician (PhC)/ Clinical Pharmacy Specialist

## 2016-11-04 DIAGNOSIS — Z7901 Long term (current) use of anticoagulants: Secondary | ICD-10-CM

## 2016-11-04 DIAGNOSIS — I4891 Unspecified atrial fibrillation: Secondary | ICD-10-CM | POA: Diagnosis not present

## 2016-11-18 ENCOUNTER — Telehealth: Payer: Self-pay | Admitting: Internal Medicine

## 2016-11-18 NOTE — Telephone Encounter (Signed)
Spoke to pt regarding AWV scheduled for 11/20/16. Pt completed wellness visit on 09/16/16. Informed pt that she has already completed AWV for this year and that she can r/s or cx appt for 11/20/16. Pt stated that she wanted to cx appt and give office a call when she is ready to r/s. SF

## 2016-11-20 ENCOUNTER — Ambulatory Visit: Payer: Medicare HMO

## 2016-11-29 ENCOUNTER — Other Ambulatory Visit: Payer: Self-pay | Admitting: Internal Medicine

## 2016-12-05 ENCOUNTER — Ambulatory Visit (INDEPENDENT_AMBULATORY_CARE_PROVIDER_SITE_OTHER): Payer: Medicare HMO | Admitting: Family Medicine

## 2016-12-05 ENCOUNTER — Encounter: Payer: Self-pay | Admitting: Family Medicine

## 2016-12-05 VITALS — BP 130/80 | HR 66 | Temp 98.1°F | Ht 66.0 in | Wt 186.0 lb

## 2016-12-05 DIAGNOSIS — R21 Rash and other nonspecific skin eruption: Secondary | ICD-10-CM | POA: Diagnosis not present

## 2016-12-05 MED ORDER — TRIAMCINOLONE ACETONIDE 0.1 % EX CREA: 1.0000 "application " | TOPICAL_CREAM | Freq: Two times a day (BID) | CUTANEOUS | 0 refills | Status: DC

## 2016-12-05 NOTE — Patient Instructions (Addendum)
Thank you for coming in,   You can try taking Zyrtec, Claritin, or Allegra. Please try taking the cream that I have provided. Please let me know if there is no improvement.   Please feel free to call with any questions or concerns at any time, at [phone]. --Dr. Raeford Razor

## 2016-12-05 NOTE — Assessment & Plan Note (Signed)
It appears that she may have gotten an insect bite. Does not appear to be infectious in nature - Triamcinolone cream - Can try Zyrtec or Allegra or Claritin to help with the itching - If no improvement may need to consider oral prednisone or a punch biopsy.

## 2016-12-05 NOTE — Progress Notes (Signed)
[name] - 82 y.o. female MRN [id number]  Date of birth: [birth year]  SUBJECTIVE:  Including CC & ROS.  Chief Complaint  Patient presents with  . Rash    started last week little red bumps on shoulder, legs, and stomach patient states it is really itchy. been using cortizone cream that has been helping    [name] is an 82 year old female that is presenting with a rash. She reports little red spots that occurred at different parts of her body. She reports them being itchy. She has tried placing over-the-counter hydrocortisone cream on them with little improvement. She denies any history of allergies. Has been outside but does not feel they are related to any contact with plants. Has not been running animals. Has been around her little granddaughter. She denies new recent illnesses. No recent fevers. Has not had any travel.     Review of Systems  Constitutional: Negative for fever.  Musculoskeletal: Negative for gait problem.  Skin: Positive for rash.  Neurological: Negative for weakness and numbness.  Hematological: Negative for adenopathy.    HISTORY: Past Medical, Surgical, Social, and Family History Reviewed & Updated per EMR.   Pertinent Historical Findings include:  Past Medical History:  Diagnosis Date  . Anxiety state, unspecified   . Bronchitis, not specified as acute or chronic   . Diaphragmatic hernia without mention of obstruction or gangrene   . Dyslipidemia   . Essential hypertension   . Irritable bowel syndrome   . Mitral valve disorders(424.0)    a. 08/2010 Echo: EF >55%, mild MR, mod TR, trace AI.  [name] Non-obstructive CAD    a. 07/2010 Lexiscan MV: no ischemia, EF 78%; b. 07/2010 Cath: LM nl, LAD50/60p, 40 apical, LCX nl, RCA dominant, 30/40p, 12m.  . Osteoarthrosis, unspecified whether generalized or localized, unspecified site   . Osteoporosis, unspecified   . Other abnormal glucose   . PAF (paroxysmal atrial fibrillation) (HCC)    a. CHA2DS2VASc =  4-->coumadin.  [name] Phlebitis and thrombophlebitis of superficial vessels of lower extremities   . PSVT (paroxysmal supraventricular tachycardia) ([facility])    a. 05/2012 Holter: short bursts of PSVT noted-->Managed with toprol.  [name] Unspecified venous (peripheral) insufficiency   . Unspecified vitamin D deficiency     Past Surgical History:  Procedure Laterality Date  . CARDIAC CATHETERIZATION  07/30/2010   nonobstructive CAD, 20-40% RCA stenosis, 50-60% eccentric LAD stenosis more prox, 40% LAD stenosis more distal, EF 65%  . TRANSTHORACIC ECHOCARDIOGRAM  09/03/2010   EF=>55%; mild MR; mod TR; AV mildly sclerotic with trace regurg; mild pulm valve regurg  . VESICOVAGINAL FISTULA CLOSURE W/ TAH      Allergies  Allergen Reactions  . Clarithromycin     REACTION: unspecified  . Fosamax [Alendronate Sodium]     achy  . Guaifenesin Er     itching  . Metformin     REACTION: pt states INTOL to Metformin "felt drained"  . Tape     Adhesive tape---rash    Family History  Problem Relation Age of Onset  . Stroke Mother   . Stroke Father        also heart disease  . Clotting disorder Paternal Grandmother        blood clot  . Stroke Brother        also heart disease     Social History   Social History  . Marital status: Widowed    Spouse name: N/A  . Number of children: 4  .  Years of education: N/A   Occupational History  . retired   .  Retired   Social History Main Topics  . Smoking status: Never Smoker  . Smokeless tobacco: Never Used  . Alcohol use No  . Drug use: No  . Sexual activity: Not on file   Other Topics Concern  . Not on file   Social History Narrative  . No narrative on file     PHYSICAL EXAM:  VS: BP 130/80 (BP Location: Left Arm, Patient Position: Sitting, Cuff Size: Normal)   Pulse 66   Temp 98.1 F (36.7 C) (Oral)   Ht 5\' 6"  (1.676 m)   Wt 186 lb (84.4 kg)   SpO2 99%   BMI 30.02 kg/m  Physical Exam Gen: NAD, alert, cooperative with exam,  well-appearing ENT: normal lips, normal nasal mucosa,  Eye: normal EOM, normal conjunctiva and lids CV:  no edema, +2 pedal pulses   Resp: no accessory muscle use, non-labored,  Skin: Annular lesions dispersed throughout her body with no specific cluster orientation. No streaking associated with them. Appear as an ulcer almost. Less than 5 mm diameter Neuro: normal tone, normal sensation to touch Psych:  normal insight, alert and oriented MSK: Normal gait, normal strength      ASSESSMENT & PLAN:   Rash It appears that she may have gotten an insect bite. Does not appear to be infectious in nature - Triamcinolone cream - Can try Zyrtec or Allegra or Claritin to help with the itching - If no improvement may need to consider oral prednisone or a punch biopsy.

## 2016-12-16 ENCOUNTER — Ambulatory Visit (INDEPENDENT_AMBULATORY_CARE_PROVIDER_SITE_OTHER): Payer: Medicare HMO | Admitting: Pharmacist Clinician (PhC)/ Clinical Pharmacy Specialist

## 2016-12-16 DIAGNOSIS — Z7901 Long term (current) use of anticoagulants: Secondary | ICD-10-CM

## 2016-12-16 DIAGNOSIS — I4891 Unspecified atrial fibrillation: Secondary | ICD-10-CM | POA: Diagnosis not present

## 2016-12-16 LAB — POCT INR: INR: 2.5

## 2017-01-15 ENCOUNTER — Other Ambulatory Visit: Payer: Self-pay | Admitting: Internal Medicine

## 2017-01-15 ENCOUNTER — Encounter: Payer: Self-pay | Admitting: Internal Medicine

## 2017-01-19 ENCOUNTER — Other Ambulatory Visit: Payer: Self-pay | Admitting: Internal Medicine

## 2017-01-21 NOTE — Telephone Encounter (Signed)
Faxed script back to [facility] pharmacy...Johny Chess

## 2017-01-27 ENCOUNTER — Ambulatory Visit (INDEPENDENT_AMBULATORY_CARE_PROVIDER_SITE_OTHER): Payer: Medicare HMO | Admitting: Pharmacist

## 2017-01-27 DIAGNOSIS — Z7901 Long term (current) use of anticoagulants: Secondary | ICD-10-CM | POA: Diagnosis not present

## 2017-01-27 DIAGNOSIS — I4891 Unspecified atrial fibrillation: Secondary | ICD-10-CM | POA: Diagnosis not present

## 2017-01-27 LAB — POCT INR: INR: 2.9

## 2017-02-17 DIAGNOSIS — R351 Nocturia: Secondary | ICD-10-CM | POA: Diagnosis not present

## 2017-02-23 DIAGNOSIS — M25552 Pain in left hip: Secondary | ICD-10-CM | POA: Diagnosis not present

## 2017-03-10 ENCOUNTER — Ambulatory Visit (INDEPENDENT_AMBULATORY_CARE_PROVIDER_SITE_OTHER): Payer: Medicare HMO | Admitting: Pharmacist

## 2017-03-10 DIAGNOSIS — Z7901 Long term (current) use of anticoagulants: Secondary | ICD-10-CM

## 2017-03-10 DIAGNOSIS — I4891 Unspecified atrial fibrillation: Secondary | ICD-10-CM

## 2017-03-10 LAB — POCT INR: INR: 1.9

## 2017-03-10 NOTE — Patient Instructions (Signed)
Call coumadin clinic if surgery scheduled

## 2017-03-17 ENCOUNTER — Ambulatory Visit: Payer: Medicare HMO | Admitting: Internal Medicine

## 2017-03-19 ENCOUNTER — Other Ambulatory Visit: Payer: Self-pay | Admitting: Internal Medicine

## 2017-03-31 ENCOUNTER — Ambulatory Visit (INDEPENDENT_AMBULATORY_CARE_PROVIDER_SITE_OTHER): Payer: Medicare HMO

## 2017-03-31 ENCOUNTER — Other Ambulatory Visit: Payer: Self-pay | Admitting: Internal Medicine

## 2017-03-31 DIAGNOSIS — Z23 Encounter for immunization: Secondary | ICD-10-CM | POA: Diagnosis not present

## 2017-04-02 ENCOUNTER — Telehealth: Payer: Self-pay | Admitting: Internal Medicine

## 2017-04-02 NOTE — Telephone Encounter (Signed)
°  New Prob   *STAT* If patient is at the pharmacy, call can be transferred to refill team.   1. Which medications need to be refilled? (please list name of each medication and dose if known) warfarin (COUMADIN) 5 MG tablet  2. Which pharmacy/location (including street and city if local pharmacy) is medication to be sent to? [facility] on [address]  3. Do they need a 30 day or 90 day supply? [place]

## 2017-04-02 NOTE — Telephone Encounter (Signed)
Rx sent yesterday to prefer pharmacy.  90 day supply not appropriate

## 2017-04-15 ENCOUNTER — Ambulatory Visit (INDEPENDENT_AMBULATORY_CARE_PROVIDER_SITE_OTHER): Payer: Medicare HMO | Admitting: Pharmacist

## 2017-04-15 DIAGNOSIS — Z7901 Long term (current) use of anticoagulants: Secondary | ICD-10-CM

## 2017-04-15 DIAGNOSIS — I4891 Unspecified atrial fibrillation: Secondary | ICD-10-CM | POA: Diagnosis not present

## 2017-04-15 LAB — POCT INR: INR: 2.7

## 2017-04-17 DIAGNOSIS — H25813 Combined forms of age-related cataract, bilateral: Secondary | ICD-10-CM | POA: Diagnosis not present

## 2017-04-17 DIAGNOSIS — E119 Type 2 diabetes mellitus without complications: Secondary | ICD-10-CM | POA: Diagnosis not present

## 2017-04-17 DIAGNOSIS — H04123 Dry eye syndrome of bilateral lacrimal glands: Secondary | ICD-10-CM | POA: Diagnosis not present

## 2017-04-17 DIAGNOSIS — H353121 Nonexudative age-related macular degeneration, left eye, early dry stage: Secondary | ICD-10-CM | POA: Diagnosis not present

## 2017-04-17 LAB — HM DIABETES EYE EXAM

## 2017-04-18 ENCOUNTER — Other Ambulatory Visit: Payer: Self-pay | Admitting: Internal Medicine

## 2017-04-20 NOTE — Telephone Encounter (Signed)
Routing to dr plotnikov, please advise, thanks 

## 2017-04-24 ENCOUNTER — Ambulatory Visit: Payer: Medicare HMO | Admitting: Internal Medicine

## 2017-04-24 ENCOUNTER — Telehealth: Payer: Self-pay | Admitting: Internal Medicine

## 2017-04-24 NOTE — Telephone Encounter (Signed)
Copied from [place] [id number]. Topic: Quick Communication - See Telephone Encounter >> Apr 24, 2017  4:59 PM Oliver Pila B wrote: CRM for notification. See Telephone encounter for: 1.11.19 traMADol (ULTRAM) 50 MG tablet [[id number]] pt is needing refill  04/24/17.

## 2017-04-27 ENCOUNTER — Other Ambulatory Visit (INDEPENDENT_AMBULATORY_CARE_PROVIDER_SITE_OTHER): Payer: Medicare HMO

## 2017-04-27 ENCOUNTER — Ambulatory Visit (INDEPENDENT_AMBULATORY_CARE_PROVIDER_SITE_OTHER): Payer: Medicare HMO | Admitting: Internal Medicine

## 2017-04-27 ENCOUNTER — Encounter: Payer: Self-pay | Admitting: Internal Medicine

## 2017-04-27 DIAGNOSIS — E785 Hyperlipidemia, unspecified: Secondary | ICD-10-CM

## 2017-04-27 DIAGNOSIS — R2242 Localized swelling, mass and lump, left lower limb: Secondary | ICD-10-CM

## 2017-04-27 DIAGNOSIS — I4891 Unspecified atrial fibrillation: Secondary | ICD-10-CM | POA: Diagnosis not present

## 2017-04-27 DIAGNOSIS — E119 Type 2 diabetes mellitus without complications: Secondary | ICD-10-CM

## 2017-04-27 DIAGNOSIS — I10 Essential (primary) hypertension: Secondary | ICD-10-CM

## 2017-04-27 DIAGNOSIS — I480 Paroxysmal atrial fibrillation: Secondary | ICD-10-CM | POA: Diagnosis not present

## 2017-04-27 DIAGNOSIS — E559 Vitamin D deficiency, unspecified: Secondary | ICD-10-CM

## 2017-04-27 LAB — CBC WITH DIFFERENTIAL/PLATELET
BASOS PCT: 0.2 % (ref 0.0–3.0)
Basophils Absolute: 0 10*3/uL (ref 0.0–0.1)
Eosinophils Absolute: 0.1 10*3/uL (ref 0.0–0.7)
Eosinophils Relative: 1.8 % (ref 0.0–5.0)
HEMATOCRIT: 39.6 % (ref 36.0–46.0)
Hemoglobin: 13 g/dL (ref 12.0–15.0)
Lymphocytes Relative: 32.5 % (ref 12.0–46.0)
Lymphs Abs: 1.6 10*3/uL (ref 0.7–4.0)
MCHC: 32.9 g/dL (ref 30.0–36.0)
MCV: 89.2 fl (ref 78.0–100.0)
MONO ABS: 0.4 10*3/uL (ref 0.1–1.0)
Monocytes Relative: 8.9 % (ref 3.0–12.0)
NEUTROS ABS: 2.9 10*3/uL (ref 1.4–7.7)
Neutrophils Relative %: 56.6 % (ref 43.0–77.0)
PLATELETS: 225 10*3/uL (ref 150.0–400.0)
RBC: 4.44 Mil/uL (ref 3.87–5.11)
RDW: 13.7 % (ref 11.5–15.5)
WBC: 5 10*3/uL (ref 4.0–10.5)

## 2017-04-27 LAB — HEPATIC FUNCTION PANEL
ALBUMIN: 4.4 g/dL (ref 3.5–5.2)
ALK PHOS: 65 U/L (ref 39–117)
ALT: 10 U/L (ref 0–35)
AST: 14 U/L (ref 0–37)
Bilirubin, Direct: 0.1 mg/dL (ref 0.0–0.3)
TOTAL PROTEIN: 7.6 g/dL (ref 6.0–8.3)
Total Bilirubin: 0.5 mg/dL (ref 0.2–1.2)

## 2017-04-27 LAB — LIPID PANEL
CHOL/HDL RATIO: 3
Cholesterol: 193 mg/dL (ref 0–200)
HDL: 70 mg/dL (ref >39.00)
LDL CALC: 102 mg/dL — AB (ref 0–99)
NONHDL: 123.28
TRIGLYCERIDES: 105 mg/dL (ref 0.0–149.0)
VLDL: 21 mg/dL (ref 0.0–40.0)

## 2017-04-27 LAB — HEMOGLOBIN A1C: Hgb A1c MFr Bld: 7.2 % — ABNORMAL HIGH (ref 4.6–6.5)

## 2017-04-27 LAB — TSH: TSH: 1.96 u[IU]/mL (ref 0.35–4.50)

## 2017-04-27 NOTE — Assessment & Plan Note (Signed)
  On diet  

## 2017-04-27 NOTE — Assessment & Plan Note (Signed)
On Coumadin, Toprol XL 

## 2017-04-27 NOTE — Telephone Encounter (Signed)
Check [place] registry last filled 10/21/2016.Marland KitchenJohny Wiggins

## 2017-04-27 NOTE — Patient Instructions (Signed)
MC well w/Jill 

## 2017-04-27 NOTE — Assessment & Plan Note (Signed)
Metoprolol, Norvasc, Lasix 

## 2017-04-27 NOTE — Assessment & Plan Note (Signed)
Vit D 

## 2017-04-27 NOTE — Assessment & Plan Note (Signed)
Simvastatin 

## 2017-04-27 NOTE — Telephone Encounter (Signed)
done

## 2017-04-27 NOTE — Assessment & Plan Note (Signed)
Surg appt on Fri

## 2017-04-27 NOTE — Progress Notes (Signed)
Subjective:  Patient ID: [name], female    DOB: 21-Apr-1934  Age: 83 y.o. MRN: [id number]  CC: No chief complaint on file.   HPI [name] presents for HTN, OA, anxiety f/u  Outpatient Medications Prior to Visit  Medication Sig Dispense Refill  . ACCU-CHEK FASTCLIX LANCETS MISC Use to check blood sugars twice a day Dx E11.9 102 each 2  . ALPRAZolam (XANAX) 0.5 MG tablet take 1/2-1 tablet by mouth three times a day if needed for anxiety 90 tablet 2  . amLODipine (NORVASC) 5 MG tablet take 1 tablet by mouth once daily 120 tablet 2  . aspirin 81 MG tablet Take 81 mg by mouth daily.      . Blood Glucose Monitoring Suppl (ACCU-CHEK AVIVA PLUS) w/Device KIT Use to check blood sugars twice a day Dx E11.9 1 kit 0  . Cholecalciferol (VITAMIN D) 2000 units tablet take 1 tablet by mouth once daily 100 tablet 2  . Cholecalciferol (VITAMIN D3) 2000 UNITS capsule Take 1 capsule (2,000 Units total) by mouth daily. 100 capsule 3  . clidinium-chlordiazePOXIDE (LIBRAX) 5-2.5 MG capsule take 1 capsule by mouth three times a day if needed for CRAMPS 60 capsule 2  . diclofenac sodium (VOLTAREN) 1 % GEL Apply 2 g topically 4 (four) times daily. Apply to left ankle four times per day per Dr. Samara Snide    . fluocinonide cream (LIDEX) 0.05 % Apply twice daily to rash as needed 30 g 5  . furosemide (LASIX) 20 MG tablet Take 1 tablet (20 mg total) by mouth daily as needed for edema. 30 tablet 3  . glucose blood (ACCU-CHEK AVIVA PLUS) test strip 1 each by Other route 2 (two) times daily. Use to check blood sugars twice a day Dx E11.9 100 each 2  . hydroxypropyl methylcellulose (ISOPTO TEARS) 2.5 % ophthalmic solution Place 1 drop into both eyes 2 (two) times daily.      . hydrOXYzine (ATARAX/VISTARIL) 25 MG tablet Take 1 tablet (25 mg total) by mouth every 8 (eight) hours as needed for itching. 60 tablet 0  . isosorbide mononitrate (IMDUR) 30 MG 24 hr tablet take 1 tablet by mouth once daily 30 tablet 9   . loperamide (IMODIUM A-D) 2 MG tablet Take 1 tablet (2 mg total) by mouth 4 (four) times daily as needed for diarrhea or loose stools. 30 tablet 0  . metoprolol succinate (TOPROL-XL) 25 MG 24 hr tablet take 1 and 1/2 tablets by mouth twice a day 270 tablet 3  . neomycin-polymyxin-hydrocortisone (CORTISPORIN) otic solution Place 4 drops into the left ear 3 (three) times daily. 10 mL 0  . pantoprazole (PROTONIX) 40 MG tablet take 1 tablet by mouth once daily 90 tablet 3  . simvastatin (ZOCOR) 20 MG tablet Take 1 tablet (20 mg total) by mouth at bedtime. 90 tablet 1  . traMADol (ULTRAM) 50 MG tablet take 1 tablet by mouth three times a day 90 tablet 2  . triamcinolone cream (KENALOG) 0.1 % Apply 1 application topically 2 (two) times daily. 30 g 0  . warfarin (COUMADIN) 5 MG tablet take 1 tablet by mouth once daily OR AS DIRECTED BY COUMADIN CLINIC 30 tablet 1   No facility-administered medications prior to visit.     ROS Review of Systems  Constitutional: Negative for activity change, appetite change, chills, fatigue and unexpected weight change.  HENT: Negative for congestion, mouth sores and sinus pressure.   Eyes: Negative for visual disturbance.  Respiratory:  Negative for cough and chest tightness.   Gastrointestinal: Negative for abdominal pain and nausea.  Genitourinary: Negative for difficulty urinating, frequency and vaginal pain.  Musculoskeletal: Positive for arthralgias. Negative for back pain and gait problem.  Skin: Negative for pallor and rash.  Neurological: Negative for dizziness, tremors, weakness, numbness and headaches.  Psychiatric/Behavioral: Negative for confusion and sleep disturbance. The patient is nervous/anxious.     Objective:  BP 134/78 (BP Location: Left Arm, Patient Position: Sitting, Cuff Size: Large)   Pulse (!) 53   Temp 98.2 F (36.8 C) (Oral)   Ht '5\' 6"'$  (1.676 m)   Wt 189 lb (85.7 kg)   SpO2 99%   BMI 30.51 kg/m   BP Readings from Last 3  Encounters:  04/27/17 134/78  12/05/16 130/80  09/16/16 130/72    Wt Readings from Last 3 Encounters:  04/27/17 189 lb (85.7 kg)  12/05/16 186 lb (84.4 kg)  09/16/16 187 lb (84.8 kg)    Physical Exam  Constitutional: She appears well-developed. No distress.  HENT:  Head: Normocephalic.  Right Ear: External ear normal.  Left Ear: External ear normal.  Nose: Nose normal.  Mouth/Throat: Oropharynx is clear and moist.  Eyes: Conjunctivae are normal. Pupils are equal, round, and reactive to light. Right eye exhibits no discharge. Left eye exhibits no discharge.  Neck: Normal range of motion. Neck supple. No JVD present. No tracheal deviation present. No thyromegaly present.  Cardiovascular: Normal rate, regular rhythm and normal heart sounds.  Pulmonary/Chest: No stridor. No respiratory distress. She has no wheezes.  Abdominal: Soft. Bowel sounds are normal. She exhibits no distension and no mass. There is no tenderness. There is no rebound and no guarding.  Musculoskeletal: She exhibits tenderness. She exhibits no edema.  Lymphadenopathy:    She has no cervical adenopathy.  Neurological: She displays normal reflexes. No cranial nerve deficit. She exhibits normal muscle tone. Coordination normal.  Skin: No rash noted. No erythema.  Psychiatric: She has a normal mood and affect. Her behavior is normal. Judgment and thought content normal.    Lab Results  Component Value Date   WBC 4.6 09/16/2016   HGB 12.5 09/16/2016   HCT 37.3 09/16/2016   PLT 217.0 09/16/2016   GLUCOSE 120 (H) 09/16/2016   CHOL 177 09/16/2016   TRIG 85.0 09/16/2016   HDL 70.90 09/16/2016   LDLDIRECT 105.5 11/26/2010   LDLCALC 89 09/16/2016   ALT 11 09/16/2016   AST 16 09/16/2016   NA 137 09/16/2016   K 4.3 09/16/2016   CL 103 09/16/2016   CREATININE 1.01 09/16/2016   BUN 14 09/16/2016   CO2 29 09/16/2016   TSH 1.00 09/16/2016   INR 2.7 04/15/2017   HGBA1C 7.1 (H) 03/18/2016    Mm Screening Breast  Tomo Bilateral  Result Date: 09/09/2016 CLINICAL DATA:  Screening. EXAM: 2D DIGITAL SCREENING BILATERAL MAMMOGRAM WITH CAD AND ADJUNCT TOMO COMPARISON:  Previous exam(s). ACR Breast Density Category b: There are scattered areas of fibroglandular density. FINDINGS: There are no findings suspicious for malignancy. Images were processed with CAD. IMPRESSION: No mammographic evidence of malignancy. A result letter of this screening mammogram will be mailed directly to the patient. RECOMMENDATION: Screening mammogram in one year. (Code:SM-B-01Y) BI-RADS CATEGORY  1: Negative. Electronically Signed   By: Everlean Alstrom M.D.   On: 09/09/2016 15:06    Assessment & Plan:   There are no diagnoses linked to this encounter. I am having Elsie Ra. Probus maintain her aspirin, hydroxypropyl methylcellulose /  hypromellose, diclofenac sodium, fluocinonide cream, loperamide, hydrOXYzine, furosemide, Vitamin D3, ACCU-CHEK FASTCLIX LANCETS, neomycin-polymyxin-hydrocortisone, glucose blood, ACCU-CHEK AVIVA PLUS, pantoprazole, amLODipine, isosorbide mononitrate, metoprolol succinate, Vitamin D, triamcinolone cream, simvastatin, clidinium-chlordiazePOXIDE, ALPRAZolam, warfarin, and traMADol.  No orders of the defined types were placed in this encounter.    Follow-up: No Follow-up on file.  Walker Kehr, MD

## 2017-04-28 ENCOUNTER — Encounter: Payer: Self-pay | Admitting: Internal Medicine

## 2017-04-28 ENCOUNTER — Ambulatory Visit: Payer: Medicare HMO | Admitting: Internal Medicine

## 2017-04-28 VITALS — BP 136/68 | HR 67 | Ht 66.0 in | Wt 188.0 lb

## 2017-04-28 DIAGNOSIS — I480 Paroxysmal atrial fibrillation: Secondary | ICD-10-CM

## 2017-04-28 MED ORDER — WARFARIN SODIUM 5 MG PO TABS: ORAL_TABLET | ORAL | 1 refills | Status: DC

## 2017-04-28 NOTE — Patient Instructions (Signed)
Your physician wants you to follow-up in: ONE YEAR with Dr. Hilty. You will receive a reminder letter in the mail two months in advance. If you don't receive a letter, please call our office to schedule the follow-up appointment.  

## 2017-04-28 NOTE — Progress Notes (Signed)
OFFICE NOTE  Chief Complaint:  No complaints  Primary Care Physician: Cassandria Anger, MD  HPI:  [name]  is a 83 year old female with a history of paroxysmal atrial fibrillation and SVT in the past. This happened when she underwent a Myoview for chest pain in the hospital. She was placed on metoprolol succinate 25 b.i.d. and has done well with that with improvement in her palpitations. Her echo showed an EF greater than 55% with some mild aortic insufficiency and an aneurysmal atrial septum. She is on simvastatin as well as Coumadin managed by you. She's recently had some abnormal labs which showed borderline diabetes. She's not currently a medication for that. She reports occasional intermittent chest pain. She underwent a cardia catheterization in the past which showed mild to moderate nonobstructive coronary disease which is managed medically. She does also have bilateral venous insufficiency of the lower chin and knees and has worn compression stockings with improvement in her swelling and symptoms.  [name] returns today without any new complaints. She denies any shortness of breath or chest pain. She does get some occasional chest wall pain and is relieved with tramadol.  I saw [name] back in the office today. Overall she reports she is doing very well. She's had very few episodes of palpitations. She has had a decreased dose in her amlodipine to 5 mg daily, likely due to leg swelling. She is now taking Lasix as needed for edema. She's had a few episodes of racing heart for which she is to perform vagal maneuvers successfully. EKG shows sinus rhythm with PVCs and PACs but no evidence of atrial fibrillation in the office today. She is on warfarin anticoagulation. We will check the INR in the office today.  [name] returns today for follow-up. Unfortunately her son died this past 06/17/22 of leukemia. She says he's been in and out of the hospital for several  months. She has had it small increase in palpitations recently but seems to be controlled with taking and a half dose of metoprolol. She denies any chest pain although has some pain behind her right shoulder blade. This is intermittent and has improved. Is not associated with exertion or relieved by rest. Her EKG today shows normal sinus rhythm without ischemia. Recent A1c was 6.8-she is not on diabetes medications. I do not see a recent lipid profile though she is on simvastatin 20 mg every OD.  04/24/2016  [name] returns today for follow-up. She has no new complaints. It's been about a year since she had the loss of her son due to leukemia. She seems to be doing fairly well although is routinely very calm and demeanor. Her blood pressure is well-controlled. Her INR was therapeutic today. She denies any worsening swelling, chest pain or shortness of breath. She denies any bleeding problems. She's not had any recurrent atrial fibrillation or palpitations that she is aware of. Her last echo was in 2012 and at the time she had mild nonobstructive coronary disease by catheterization. She was also noted to have moderate tricuspid regurgitation and some LVH. Her EKG today shows moderate LVH by voltage criteria.  04/28/2017  [name] returns today for follow-up.  She denies any chest pain or worsening shortness of breath.  She saw her primary care provider yesterday no medicine changes were made.  She is contemplating removing a small mass on the left inner thigh.  He has an appointment with a surgeon coming up and we may  need to hold her warfarin for that.  She has had no significant recurrent atrial fibrillation.  She is in sinus today.  She prefers to stay on warfarin.  INR has been therapeutic and is adjusted every 6 weeks.  PMHx:  Past Medical History:  Diagnosis Date  . Anxiety state, unspecified   . Bronchitis, not specified as acute or chronic   . Diaphragmatic hernia without mention of  obstruction or gangrene   . Dyslipidemia   . Essential hypertension   . Irritable bowel syndrome   . Mitral valve disorders(424.0)    a. 08/2010 Echo: EF >55%, mild MR, mod TR, trace AI.  [name] Non-obstructive CAD    a. 07/2010 Lexiscan MV: no ischemia, EF 78%; b. 07/2010 Cath: LM nl, LAD50/60p, 40 apical, LCX nl, RCA dominant, 30/40p, 8m  . Osteoarthrosis, unspecified whether generalized or localized, unspecified site   . Osteoporosis, unspecified   . Other abnormal glucose   . PAF (paroxysmal atrial fibrillation) (HCC)    a. CHA2DS2VASc = 4-->coumadin.  .Marland KitchenPhlebitis and thrombophlebitis of superficial vessels of lower extremities   . [place] ([place]oxys[place] tachycardia[place]    a. 05/2012 Holter: short bursts of PSVT noted-->Managed with toprol.  .Marland KitchenUnspecified venous (peripheral) insufficiency   . Unspecified vitamin D deficiency     Past Surgical History:  Procedure Laterality Date  . CARDIAC CATHETERIZATION  07/30/2010   nonobstructive CAD, 20-40% RCA stenosis, 50-60% eccentric LAD stenosis more prox, 40% LAD stenosis more distal, EF 65%  . TRANSTHORACIC ECHOCARDIOGRAM  09/03/2010   EF=>55%; mild MR; mod TR; AV mildly sclerotic with trace regurg; mild pulm valve regurg  . VESICOVAGINAL FISTULA CLOSURE W/ TAH      FAMHx:  Family History  Problem Relation Age of Onset  . Stroke Mother   . Stroke Father        also heart disease  . Clotting disorder Paternal Grandmother        blood clot  . Stroke Brother        also heart disease    SOCHx:   reports that  has never smoked. she has never used smokeless tobacco. She reports that she does not drink alcohol or use drugs.  ALLERGIES:  Allergies  Allergen Reactions  . Clarithromycin     REACTION: unspecified  . Fosamax [Alendronate Sodium]     achy  . Guaifenesin Er     itching  . Metformin     REACTION: pt states INTOL to Metformin "felt drained"  . Tape     Adhesive tape---rash    ROS: Pertinent items noted  in HPI and remainder of comprehensive ROS otherwise negative.  HOME MEDS: Current Outpatient Medications  Medication Sig Dispense Refill  . ACCU-CHEK FASTCLIX LANCETS MISC Use to check blood sugars twice a day Dx E11.9 102 each 2  . ALPRAZolam (XANAX) 0.5 MG tablet take 1/2-1 tablet by mouth three times a day if needed for anxiety 90 tablet 2  . amLODipine (NORVASC) 5 MG tablet take 1 tablet by mouth once daily 120 tablet 2  . aspirin 81 MG tablet Take 81 mg by mouth daily.      . Blood Glucose Monitoring Suppl (ACCU-CHEK AVIVA PLUS) w/Device KIT Use to check blood sugars twice a day Dx E11.9 1 kit 0  . Cholecalciferol (VITAMIN D) 2000 units tablet take 1 tablet by mouth once daily 100 tablet 2  . Cholecalciferol (VITAMIN D3) 2000 UNITS capsule Take 1 capsule (2,000 Units total) by mouth  daily. 100 capsule 3  . clidinium-chlordiazePOXIDE (LIBRAX) 5-2.5 MG capsule take 1 capsule by mouth three times a day if needed for CRAMPS 60 capsule 2  . furosemide (LASIX) 20 MG tablet Take 1 tablet (20 mg total) by mouth daily as needed for edema. 30 tablet 3  . glucose blood (ACCU-CHEK AVIVA PLUS) test strip 1 each by Other route 2 (two) times daily. Use to check blood sugars twice a day Dx E11.9 100 each 2  . hydroxypropyl methylcellulose (ISOPTO TEARS) 2.5 % ophthalmic solution Place 1 drop into both eyes 2 (two) times daily.      . hydrOXYzine (ATARAX/VISTARIL) 25 MG tablet Take 1 tablet (25 mg total) by mouth every 8 (eight) hours as needed for itching. 60 tablet 0  . isosorbide mononitrate (IMDUR) 30 MG 24 hr tablet take 1 tablet by mouth once daily 30 tablet 9  . loperamide (IMODIUM A-D) 2 MG tablet Take 1 tablet (2 mg total) by mouth 4 (four) times daily as needed for diarrhea or loose stools. 30 tablet 0  . metoprolol succinate (TOPROL-XL) 25 MG 24 hr tablet take 1 and 1/2 tablets by mouth twice a day 270 tablet 3  . pantoprazole (PROTONIX) 40 MG tablet take 1 tablet by mouth once daily 90 tablet 3   . simvastatin (ZOCOR) 20 MG tablet Take 1 tablet (20 mg total) by mouth at bedtime. 90 tablet 1  . traMADol (ULTRAM) 50 MG tablet take 1 tablet by mouth three times a day 90 tablet 2  . warfarin (COUMADIN) 5 MG tablet take 1 tablet by mouth once daily OR AS DIRECTED BY COUMADIN CLINIC 30 tablet 1  . diclofenac sodium (VOLTAREN) 1 % GEL Apply 2 g topically 4 (four) times daily. Apply to left ankle four times per day per Dr. Samara Snide    . fluocinonide cream (LIDEX) 0.05 % Apply twice daily to rash as needed (Patient not taking: Reported on 04/28/2017) 30 g 5  . neomycin-polymyxin-hydrocortisone (CORTISPORIN) otic solution Place 4 drops into the left ear 3 (three) times daily. (Patient not taking: Reported on 04/28/2017) 10 mL 0  . triamcinolone cream (KENALOG) 0.1 % Apply 1 application topically 2 (two) times daily. (Patient not taking: Reported on 04/28/2017) 30 g 0   No current facility-administered medications for this visit.     LABS/IMAGING: Results for orders placed or performed in visit on 04/27/17 (from the past 48 hour(s))  TSH     Status: None   Collection Time: 04/27/17  9:59 AM  Result Value Ref Range   TSH 1.96 0.35 - 4.50 uIU/mL  Hemoglobin A1c     Status: Abnormal   Collection Time: 04/27/17  9:59 AM  Result Value Ref Range   Hgb A1c MFr Bld 7.2 (H) 4.6 - 6.5 %    Comment: Glycemic Control Guidelines for People with Diabetes:Non Diabetic:  <6%Goal of Therapy: <7%Additional Action Suggested:  >8%   Lipid panel     Status: Abnormal   Collection Time: 04/27/17  9:59 AM  Result Value Ref Range   Cholesterol 193 0 - 200 mg/dL    Comment: ATP III Classification       Desirable:  < 200 mg/dL               Borderline High:  200 - 239 mg/dL          High:  > = 240 mg/dL   Triglycerides 105.0 0.0 - 149.0 mg/dL    Comment: Normal:  <150 mg/dLBorderline  High:  150 - 199 mg/dL   HDL 70.00 >39.00 mg/dL   VLDL 21.0 0.0 - 40.0 mg/dL   LDL Cholesterol 102 (H) 0 - 99 mg/dL   Total CHOL/HDL  Ratio 3     Comment:                Men          Women1/2 Average Risk     3.4          3.3Average Risk          5.0          4.42X Average Risk          9.6          7.13X Average Risk          15.0          11.0                       NonHDL 123.28     Comment: NOTE:  Non-HDL goal should be 30 mg/dL higher than patient's LDL goal (i.e. LDL goal of < 70 mg/dL, would have non-HDL goal of < 100 mg/dL)  Hepatic function panel     Status: None   Collection Time: 04/27/17  9:59 AM  Result Value Ref Range   Total Bilirubin 0.5 0.2 - 1.2 mg/dL   Bilirubin, Direct 0.1 0.0 - 0.3 mg/dL   Alkaline Phosphatase 65 39 - 117 U/L   AST 14 0 - 37 U/L   ALT 10 0 - 35 U/L   Total Protein 7.6 6.0 - 8.3 g/dL   Albumin 4.4 3.5 - 5.2 g/dL  CBC with Differential/Platelet     Status: None   Collection Time: 04/27/17  9:59 AM  Result Value Ref Range   WBC 5.0 4.0 - 10.5 K/uL   RBC 4.44 3.87 - 5.11 Mil/uL   Hemoglobin 13.0 12.0 - 15.0 g/dL   HCT 39.6 36.0 - 46.0 %   MCV 89.2 78.0 - 100.0 fl   MCHC 32.9 30.0 - 36.0 g/dL   RDW 13.7 11.5 - 15.5 %   Platelets 225.0 150.0 - 400.0 K/uL   Neutrophils Relative % 56.6 43.0 - 77.0 %   Lymphocytes Relative 32.5 12.0 - 46.0 %   Monocytes Relative 8.9 3.0 - 12.0 %   Eosinophils Relative 1.8 0.0 - 5.0 %   Basophils Relative 0.2 0.0 - 3.0 %   Neutro Abs 2.9 1.4 - 7.7 K/uL   Lymphs Abs 1.6 0.7 - 4.0 K/uL   Monocytes Absolute 0.4 0.1 - 1.0 K/uL   Eosinophils Absolute 0.1 0.0 - 0.7 K/uL   Basophils Absolute 0.0 0.0 - 0.1 K/uL   No results found.  VITALS: There were no vitals taken for this visit.  EXAM: General appearance: alert and no distress Neck: no carotid bruit and no JVD Lungs: clear to auscultation bilaterally Heart: regular rate and rhythm, S1, S2 normal, 2/6 SEM at apex, no click, rub or gallop Abdomen: soft, non-tender; bowel sounds normal; no masses,  no organomegaly and overweight Extremities: extremities normal, atraumatic, no cyanosis or  edema Pulses: 2+ and symmetric Skin: Skin color, texture, turgor normal. No rashes or lesions Neurologic: Grossly normal Psych: Mood, affect normal  EKG: Sinus rhythm at 67, moderate voltage criteria for LVH-personally reviewed  ASSESSMENT: 1. Paroxysmal atrial fibrillation on warfarin - CHADSVASC score of 4 2. Hypertension-controlled 3. Dyslipidemia-on statin 4. Overweight 5. Moderate, non-obstructive CAD by  cath in 2012 6. PSVT - controlled by b-blocker and vagal maneuvers 7. LVH, mild to moderate TR by echo 2012  PLAN: 1.   Ms. Dutson has had no significant recurrent A. fib over the past year.  Her blood pressures well controlled.  Her cholesterol is been a goal.  She had an echocardiogram which showed normal LV function and no significant valvular disease last February.  Overall she is doing well.  No changes to her medications today.  Plan to see her back annually or sooner as necessary.  Pixie Casino, MD, [facility], [place] Director of the Advanced Lipid Disorders &  Cardiovascular Risk Reduction Clinic Diplomate of the American Board of Clinical Lipidology Attending Cardiologist  Direct Dial: [phone]  Fax: [phone]  Website:  [email or web address] Keilani Terrance 04/28/2017, 2:03 PM

## 2017-04-30 ENCOUNTER — Encounter: Payer: Self-pay | Admitting: Internal Medicine

## 2017-05-01 DIAGNOSIS — D1720 Benign lipomatous neoplasm of skin and subcutaneous tissue of unspecified limb: Secondary | ICD-10-CM | POA: Diagnosis not present

## 2017-05-09 ENCOUNTER — Other Ambulatory Visit: Payer: Self-pay | Admitting: Internal Medicine

## 2017-05-26 ENCOUNTER — Ambulatory Visit (INDEPENDENT_AMBULATORY_CARE_PROVIDER_SITE_OTHER): Payer: Medicare HMO | Admitting: Pharmacist

## 2017-05-26 DIAGNOSIS — Z7901 Long term (current) use of anticoagulants: Secondary | ICD-10-CM

## 2017-05-26 DIAGNOSIS — I4891 Unspecified atrial fibrillation: Secondary | ICD-10-CM | POA: Diagnosis not present

## 2017-05-26 LAB — POCT INR: INR: 2.6

## 2017-05-27 ENCOUNTER — Other Ambulatory Visit: Payer: Self-pay | Admitting: Internal Medicine

## 2017-06-18 ENCOUNTER — Other Ambulatory Visit: Payer: Self-pay | Admitting: Internal Medicine

## 2017-06-18 DIAGNOSIS — I480 Paroxysmal atrial fibrillation: Secondary | ICD-10-CM

## 2017-06-26 ENCOUNTER — Other Ambulatory Visit: Payer: Self-pay | Admitting: Internal Medicine

## 2017-06-29 NOTE — Telephone Encounter (Signed)
REFILL 

## 2017-07-07 ENCOUNTER — Ambulatory Visit (INDEPENDENT_AMBULATORY_CARE_PROVIDER_SITE_OTHER): Payer: Medicare HMO | Admitting: Pharmacist

## 2017-07-07 DIAGNOSIS — I4891 Unspecified atrial fibrillation: Secondary | ICD-10-CM

## 2017-07-07 DIAGNOSIS — Z7901 Long term (current) use of anticoagulants: Secondary | ICD-10-CM

## 2017-07-07 LAB — POCT INR: INR: 2.6

## 2017-07-08 ENCOUNTER — Other Ambulatory Visit: Payer: Self-pay | Admitting: Internal Medicine

## 2017-08-05 ENCOUNTER — Other Ambulatory Visit: Payer: Self-pay | Admitting: Internal Medicine

## 2017-08-05 NOTE — Telephone Encounter (Signed)
REFILL 

## 2017-08-10 ENCOUNTER — Other Ambulatory Visit: Payer: Self-pay | Admitting: Internal Medicine

## 2017-08-10 DIAGNOSIS — I480 Paroxysmal atrial fibrillation: Secondary | ICD-10-CM

## 2017-08-18 ENCOUNTER — Ambulatory Visit (INDEPENDENT_AMBULATORY_CARE_PROVIDER_SITE_OTHER): Payer: Medicare HMO | Admitting: Pharmacist

## 2017-08-18 DIAGNOSIS — Z7901 Long term (current) use of anticoagulants: Secondary | ICD-10-CM

## 2017-08-18 DIAGNOSIS — I4891 Unspecified atrial fibrillation: Secondary | ICD-10-CM

## 2017-08-18 LAB — POCT INR: INR: 2.4

## 2017-08-22 ENCOUNTER — Other Ambulatory Visit: Payer: Self-pay | Admitting: Internal Medicine

## 2017-09-22 ENCOUNTER — Other Ambulatory Visit: Payer: Self-pay | Admitting: Internal Medicine

## 2017-09-29 ENCOUNTER — Ambulatory Visit (INDEPENDENT_AMBULATORY_CARE_PROVIDER_SITE_OTHER): Payer: Medicare HMO | Admitting: Pharmacist

## 2017-09-29 DIAGNOSIS — I4891 Unspecified atrial fibrillation: Secondary | ICD-10-CM | POA: Diagnosis not present

## 2017-09-29 DIAGNOSIS — Z7901 Long term (current) use of anticoagulants: Secondary | ICD-10-CM

## 2017-09-29 LAB — POCT INR: INR: 2.7 (ref 2.0–3.0)

## 2017-10-06 ENCOUNTER — Other Ambulatory Visit: Payer: Self-pay | Admitting: Internal Medicine

## 2017-10-06 DIAGNOSIS — I480 Paroxysmal atrial fibrillation: Secondary | ICD-10-CM

## 2017-10-13 ENCOUNTER — Other Ambulatory Visit: Payer: Self-pay | Admitting: Internal Medicine

## 2017-10-28 NOTE — Progress Notes (Addendum)
Subjective:   [name] is a 83 y.o. female who presents for Medicare Annual (Subsequent) preventive examination.  Review of Systems:  No ROS.  Medicare Wellness Visit. Additional risk factors are reflected in the social history.  Cardiac Risk Factors include: advanced age (>23mn, >>53women);diabetes mellitus;dyslipidemia;hypertension Sleep patterns: gets up 1 times nightly to void and sleeps 7-8 hours nightly.    Home Safety/Smoke Alarms: Feels safe in home. Smoke alarms in place.  Living environment; residence and Firearm Safety: 1-story house/ trailer, no firearms. Lives with special needs son, no needs for DME, good support system Seat Belt Safety/Bike Helmet: Wears seat belt.      Objective:     Vitals: BP 126/72   Pulse 62   Ht '5\' 6"'$  (1.676 m)   Wt 187 lb (84.8 kg)   SpO2 98%   BMI 30.18 kg/m   Body mass index is 30.18 kg/m.  Advanced Directives 10/29/2017  Does Patient Have a Medical Advance Directive? No  Would patient like information on creating a medical advance directive? Yes (ED - Information included in AVS)    Tobacco Social History   Tobacco Use  Smoking Status Never Smoker  Smokeless Tobacco Never Used     Counseling given: Not Answered    Past Medical History:  Diagnosis Date  . Anxiety state, unspecified   . Bronchitis, not specified as acute or chronic   . Diaphragmatic hernia without mention of obstruction or gangrene   . Dyslipidemia   . Essential hypertension   . Irritable bowel syndrome   . Mitral valve disorders(424.0)    a. 08/2010 Echo: EF >55%, mild MR, mod TR, trace AI.  .Marland KitchenNon-obstructive CAD    a. 07/2010 Lexiscan MV: no ischemia, EF 78%; b. 07/2010 Cath: LM nl, LAD50/60p, 40 apical, LCX nl, RCA dominant, 30/40p, 247m . Osteoarthrosis, unspecified whether generalized or localized, unspecified site   . Osteoporosis, unspecified   . Other abnormal glucose   . PAF (paroxysmal atrial fibrillation) (HCC)    a. CHA2DS2VASc =  4-->coumadin.  . Marland Kitchenhlebitis and thrombophlebitis of superficial vessels of lower extremities   . [place]   a. 05/2012 Holter: short bursts of PSVT noted-->Managed with toprol.  . Marland Kitchennspecified venous (peripheral) insufficiency   . Unspecified vitamin D deficiency    Past Surgical History:  Procedure Laterality Date  . CARDIAC CATHETERIZATION  07/30/2010   nonobstructive CAD, 20-40% RCA stenosis, 50-60% eccentric LAD stenosis more prox, 40% LAD stenosis more distal, EF 65%  . TRANSTHORACIC ECHOCARDIOGRAM  09/03/2010   EF=>55%; mild MR; mod TR; AV mildly sclerotic with trace regurg; mild pulm valve regurg  . VESICOVAGINAL FISTULA CLOSURE W/ TAH     Family History  Problem Relation Age of Onset  . Stroke Mother   . Stroke Father        also heart disease  . Clotting disorder Paternal Grandmother        blood clot  . Stroke Brother        also heart disease   Social History   Socioeconomic History  . Marital status: Widowed    Spouse name: Not on file  . Number of children: 4  . Years of education: Not on file  . Highest education level: Not on file  Occupational History  . Occupation: retired    EmFish farm managerRETIRED  Social Needs  . Financial resource strain: Not hard at all  . Food insecurity:    Worry: Never  true    Inability: Never true  . Transportation needs:    Medical: No    Non-medical: No  Tobacco Use  . Smoking status: Never Smoker  . Smokeless tobacco: Never Used  Substance and Sexual Activity  . Alcohol use: No  . Drug use: No  . Sexual activity: Not Currently  Lifestyle  . Physical activity:    Days per week: 0 days    Minutes per session: 0 min  . Stress: Not at all  Relationships  . Social connections:    Talks on phone: More than three times a week    Gets together: More than three times a week    Attends religious service: More than 4 times per year    Active member of club or organization: Yes    Attends  meetings of clubs or organizations: More than 4 times per year    Relationship status: Widowed  Other Topics Concern  . Not on file  Social History Narrative  . Not on file    Outpatient Encounter Medications as of 10/29/2017  Medication Sig  . ACCU-CHEK FASTCLIX LANCETS MISC Use to check blood sugars twice a day Dx E11.9  . amLODipine (NORVASC) 5 MG tablet TAKE 1 TABLET BY MOUTH ONCE DAILY  . aspirin 81 MG tablet Take 81 mg by mouth daily.    . Blood Glucose Monitoring Suppl (ACCU-CHEK AVIVA PLUS) w/Device KIT Use to check blood sugars twice a day Dx E11.9  . Cholecalciferol (VITAMIN D) 2000 units tablet take 1 tablet by mouth once daily  . clidinium-chlordiazePOXIDE (LIBRAX) 5-2.5 MG capsule take 1 capsule by mouth three times a day if needed for CRAMPS  . D3 SUPER STRENGTH 2000 units CAPS TAKE 1 CAPSULE BY MOUTH EVERY DAY  . diclofenac sodium (VOLTAREN) 1 % GEL Apply 2 g topically 4 (four) times daily. Apply to left ankle four times per day per Dr. Samara Snide  . fluocinonide cream (LIDEX) 0.05 % Apply twice daily to rash as needed  . furosemide (LASIX) 20 MG tablet Take 1 tablet (20 mg total) by mouth daily as needed for edema.  [name] glucose blood (ACCU-CHEK AVIVA PLUS) test strip Use to check blood sugars twice a day  . hydroxypropyl methylcellulose (ISOPTO TEARS) 2.5 % ophthalmic solution Place 1 drop into both eyes 2 (two) times daily.    . hydrOXYzine (ATARAX/VISTARIL) 25 MG tablet Take 1 tablet (25 mg total) by mouth every 8 (eight) hours as needed for itching.  . isosorbide mononitrate (IMDUR) 30 MG 24 hr tablet TAKE 1 TABLET BY MOUTH ONCE DAILY  . loperamide (IMODIUM A-D) 2 MG tablet Take 1 tablet (2 mg total) by mouth 4 (four) times daily as needed for diarrhea or loose stools.  . metoprolol succinate (TOPROL-XL) 25 MG 24 hr tablet TAKE 1 AND 1/2 TABLET TWICE A DAY  . neomycin-polymyxin-hydrocortisone (CORTISPORIN) otic solution Place 4 drops into the left ear 3 (three) times daily.  .  pantoprazole (PROTONIX) 40 MG tablet take 1 tablet by mouth once daily  . simvastatin (ZOCOR) 20 MG tablet Take 1 tablet (20 mg total) by mouth at bedtime.  . triamcinolone cream (KENALOG) 0.1 % Apply 1 application topically 2 (two) times daily.  [name] warfarin (COUMADIN) 5 MG tablet TAKE 1 TABLET BY MOUTH ONCE DAILY OR AS DIRECTED BY COUMADIN CLINIC  . [DISCONTINUED] ALPRAZolam (XANAX) 0.5 MG tablet TAKE 1/2 TO 1 TABLET BY MOUTH 3 TIMES DAILY AS NEEDED FOR ANXIETY  . [DISCONTINUED] traMADol (ULTRAM) 50  MG tablet take 1 tablet by mouth three times a day   No facility-administered encounter medications on file as of 10/29/2017.     Activities of Daily Living In your present state of health, do you have any difficulty performing the following activities: 10/29/2017  Hearing? N  Vision? N  Difficulty concentrating or making decisions? N  Walking or climbing stairs? N  Dressing or bathing? N  Doing errands, shopping? N  Preparing Food and eating ? N  Using the Toilet? N  In the past six months, have you accidently leaked urine? N  Do you have problems with loss of bowel control? N  Managing your Medications? Y  Managing your Finances? Y  Housekeeping or managing your Housekeeping? N  Some recent data might be hidden    Patient Care Team: Plotnikov, Evie Lacks, MD as PCP - General (Internal Medicine) Debara Pickett Nadean Corwin, MD as PCP - Cardiology (Cardiology) Warden Fillers, MD as Consulting Physician (Ophthalmology)    Assessment:   This is a routine wellness examination for [facility]. Physical assessment deferred to PCP.   Exercise Activities and Dietary recommendations Current Exercise Habits: Structured exercise class, Type of exercise: walking;stretching, Time (Minutes): 35, Frequency (Times/Week): 3, Weekly Exercise (Minutes/Week): 105, Exercise limited by: orthopedic condition(s)  Diet (meal preparation, eat out, water intake, caffeinated beverages, dairy products, fruits and  vegetables): in general, a "healthy" diet  , on average, 2 meals per day reports poor appetite at times.  Reviewed heart healthy and diabetic diet. Encouraged patient to increase daily water and fluid intake. Discussed supplementing with glucerna (samples and coupons provided).   Goals    . Patient Stated     I want to go to the [facility] more routinely about 2 times weekly. Continue to stay active in my church doing volunteer work and bible studies. Enjoy life and family.       Fall Risk Fall Risk  10/29/2017 04/27/2017 03/18/2016 02/08/2015 07/05/2012  Falls in the past year? No No No No No   Depression Screen PHQ 2/9 Scores 10/29/2017 04/27/2017 03/18/2016 02/08/2015  PHQ - 2 Score 0 0 0 1  PHQ- 9 Score 3 - - -     Cognitive Function MMSE - Mini Mental State Exam 10/29/2017  Orientation to time 5  Orientation to Place 5  Registration 3  Attention/ Calculation 4  Recall 1  Language- name 2 objects 2  Language- repeat 1  Language- follow 3 step command 3  Language- read & follow direction 1  Write a sentence 1  Copy design 1  Total score 27        Immunization History  Administered Date(s) Administered  . Influenza Split 01/21/2011, 02/24/2012  . Influenza Whole 02/02/2009, 05/21/2010  . Influenza, High Dose Seasonal PF 03/18/2016, 03/31/2017  . Influenza,inj,Quad PF,6+ Mos 01/11/2013, 05/01/2014, 02/08/2015   Screening Tests Health Maintenance  Topic Date Due  . FOOT EXAM  10/04/1944  . URINE MICROALBUMIN  [birth year]04/1944  . TETANUS/TDAP  10/04/1955  . DEXA SCAN  10/30/2018 (Originally 10/03/2001)  . PNA vac Low Risk Adult (1 of 2 - PCV13) 10/30/2018 (Originally 10/03/2001)  . INFLUENZA VACCINE  11/12/2017  . OPHTHALMOLOGY EXAM  04/17/2018      Plan:     Continue doing brain stimulating activities (puzzles, reading, adult coloring books, staying active) to keep memory sharp.   Continue to eat heart healthy diet (full of fruits, vegetables, whole grains, lean protein,  water--limit salt, fat, and sugar intake) and increase physical activity  as tolerated.  I have personally reviewed and noted the following in the patient's chart:   . Medical and social history . Use of alcohol, tobacco or illicit drugs  . Current medications and supplements . Functional ability and status . Nutritional status . Physical activity . Advanced directives . List of other physicians . Vitals . Screenings to include cognitive, depression, and falls . Referrals and appointments  In addition, I have reviewed and discussed with patient certain preventive protocols, quality metrics, and best practice recommendations. A written personalized care plan for preventive services as well as general preventive health recommendations were provided to patient.     Michiel Cowboy, RN  10/29/2017  Medical screening examination/treatment/procedure(s) were performed by non-physician practitioner and as supervising physician I was immediately available for consultation/collaboration. I agree with above. Lew Dawes, MD

## 2017-10-29 ENCOUNTER — Ambulatory Visit (INDEPENDENT_AMBULATORY_CARE_PROVIDER_SITE_OTHER): Payer: Medicare HMO | Admitting: Internal Medicine

## 2017-10-29 ENCOUNTER — Ambulatory Visit (INDEPENDENT_AMBULATORY_CARE_PROVIDER_SITE_OTHER): Payer: Medicare HMO | Admitting: *Deleted

## 2017-10-29 ENCOUNTER — Telehealth: Payer: Self-pay | Admitting: *Deleted

## 2017-10-29 ENCOUNTER — Encounter: Payer: Self-pay | Admitting: Internal Medicine

## 2017-10-29 VITALS — BP 126/72 | HR 62 | Ht 66.0 in | Wt 187.0 lb

## 2017-10-29 VITALS — BP 126/72 | HR 62 | Temp 98.6°F | Ht 66.0 in | Wt 187.0 lb

## 2017-10-29 DIAGNOSIS — E119 Type 2 diabetes mellitus without complications: Secondary | ICD-10-CM | POA: Diagnosis not present

## 2017-10-29 DIAGNOSIS — I2510 Atherosclerotic heart disease of native coronary artery without angina pectoris: Secondary | ICD-10-CM

## 2017-10-29 DIAGNOSIS — I471 Supraventricular tachycardia: Secondary | ICD-10-CM | POA: Diagnosis not present

## 2017-10-29 DIAGNOSIS — Z0000 Encounter for general adult medical examination without abnormal findings: Secondary | ICD-10-CM

## 2017-10-29 DIAGNOSIS — I2583 Coronary atherosclerosis due to lipid rich plaque: Secondary | ICD-10-CM

## 2017-10-29 DIAGNOSIS — I10 Essential (primary) hypertension: Secondary | ICD-10-CM

## 2017-10-29 MED ORDER — TRAMADOL HCL 50 MG PO TABS: 50.0000 mg | ORAL_TABLET | Freq: Three times a day (TID) | ORAL | 2 refills | Status: DC

## 2017-10-29 MED ORDER — ALPRAZOLAM 0.5 MG PO TABS: ORAL_TABLET | ORAL | 0 refills | Status: DC

## 2017-10-29 MED ORDER — FUROSEMIDE 20 MG PO TABS: 20.0000 mg | ORAL_TABLET | Freq: Every day | ORAL | 3 refills | Status: DC | PRN

## 2017-10-29 NOTE — Assessment & Plan Note (Signed)

## 2017-10-29 NOTE — Patient Instructions (Addendum)
Continue doing brain stimulating activities (puzzles, reading, adult coloring books, staying active) to keep memory sharp.   Continue to eat heart healthy diet (full of fruits, vegetables, whole grains, lean protein, water--limit salt, fat, and sugar intake) and increase physical activity as tolerated.   [name] , Thank you for taking time to come for your Medicare Wellness Visit. I appreciate your ongoing commitment to your health goals. Please review the following plan we discussed and let me know if I can assist you in the future.   These are the goals we discussed: Goals    . Patient Stated     I want to go to the [facility] more routinely about 2 times weekly. Continue to stay active in my church doing volunteer work and bible studies. Enjoy life and family.       This is a list of the screening recommended for you and due dates:  Health Maintenance  Topic Date Due  . Complete foot exam   10/04/1944  . Urine Protein Check  10/04/1944  . Tetanus Vaccine  10/04/1955  . DEXA scan (bone density measurement)  10/30/2018*  . Pneumonia vaccines (1 of 2 - PCV13) 10/30/2018*  . Flu Shot  11/12/2017  . Eye exam for diabetics  04/17/2018  *Topic was postponed. The date shown is not the original due date.   Health Maintenance, Female Adopting a healthy lifestyle and getting preventive care can go a long way to promote health and wellness. Talk with your health care provider about what schedule of regular examinations is right for you. This is a good chance for you to check in with your provider about disease prevention and staying healthy. In between checkups, there are plenty of things you can do on your own. Experts have done a lot of research about which lifestyle changes and preventive measures are most likely to keep you healthy. Ask your health care provider for more information. Weight and diet Eat a healthy diet  Be sure to include plenty of vegetables, fruits, low-fat dairy  products, and lean protein.  Do not eat a lot of foods high in solid fats, added sugars, or salt.  Get regular exercise. This is one of the most important things you can do for your health. ? Most adults should exercise for at least 150 minutes each week. The exercise should increase your heart rate and make you sweat (moderate-intensity exercise). ? Most adults should also do strengthening exercises at least twice a week. This is in addition to the moderate-intensity exercise.  Maintain a healthy weight  Body mass index (BMI) is a measurement that can be used to identify possible weight problems. It estimates body fat based on height and weight. Your health care provider can help determine your BMI and help you achieve or maintain a healthy weight.  For females 34 years of age and older: ? A BMI below 18.5 is considered underweight. ? A BMI of 18.5 to 24.9 is normal. ? A BMI of 25 to 29.9 is considered overweight. ? A BMI of 30 and above is considered obese.  Watch levels of cholesterol and blood lipids  You should start having your blood tested for lipids and cholesterol at 81 years of age, then have this test every 5 years.  You may need to have your cholesterol levels checked more often if: ? Your lipid or cholesterol levels are high. ? You are older than 81 years of age. ? You are at high risk for heart  disease.  Cancer screening Lung Cancer  Lung cancer screening is recommended for adults 109-59 years old who are at high risk for lung cancer because of a history of smoking.  A yearly low-dose CT scan of the lungs is recommended for people who: ? Currently smoke. ? Have quit within the past 15 years. ? Have at least a 30-pack-year history of smoking. A pack year is smoking an average of one pack of cigarettes a day for 1 year.  Yearly screening should continue until it has been 15 years since you quit.  Yearly screening should stop if you develop a health problem that would  prevent you from having lung cancer treatment.  Breast Cancer  Practice breast self-awareness. This means understanding how your breasts normally appear and feel.  It also means doing regular breast self-exams. Let your health care provider know about any changes, no matter how small.  If you are in your 20s or 30s, you should have a clinical breast exam (CBE) by a health care provider every 1-3 years as part of a regular health exam.  If you are 29 or older, have a CBE every year. Also consider having a breast X-ray (mammogram) every year.  If you have a family history of breast cancer, talk to your health care provider about genetic screening.  If you are at high risk for breast cancer, talk to your health care provider about having an MRI and a mammogram every year.  Breast cancer gene (BRCA) assessment is recommended for women who have family members with BRCA-related cancers. BRCA-related cancers include: ? Breast. ? Ovarian. ? Tubal. ? Peritoneal cancers.  Results of the assessment will determine the need for genetic counseling and BRCA1 and BRCA2 testing.  Cervical Cancer Your health care provider may recommend that you be screened regularly for cancer of the pelvic organs (ovaries, uterus, and vagina). This screening involves a pelvic examination, including checking for microscopic changes to the surface of your cervix (Pap test). You may be encouraged to have this screening done every 3 years, beginning at age 53.  For women ages 55-65, health care providers may recommend pelvic exams and Pap testing every 3 years, or they may recommend the Pap and pelvic exam, combined with testing for human papilloma virus (HPV), every 5 years. Some types of HPV increase your risk of cervical cancer. Testing for HPV may also be done on women of any age with unclear Pap test results.  Other health care providers may not recommend any screening for nonpregnant women who are considered low risk  for pelvic cancer and who do not have symptoms. Ask your health care provider if a screening pelvic exam is right for you.  If you have had past treatment for cervical cancer or a condition that could lead to cancer, you need Pap tests and screening for cancer for at least 20 years after your treatment. If Pap tests have been discontinued, your risk factors (such as having a new sexual partner) need to be reassessed to determine if screening should resume. Some women have medical problems that increase the chance of getting cervical cancer. In these cases, your health care provider may recommend more frequent screening and Pap tests.  Colorectal Cancer  This type of cancer can be detected and often prevented.  Routine colorectal cancer screening usually begins at 81 years of age and continues through 81 years of age.  Your health care provider may recommend screening at an earlier age if you have  risk factors for colon cancer.  Your health care provider may also recommend using home test kits to check for hidden blood in the stool.  A small camera at the end of a tube can be used to examine your colon directly (sigmoidoscopy or colonoscopy). This is done to check for the earliest forms of colorectal cancer.  Routine screening usually begins at age 20.  Direct examination of the colon should be repeated every 5-10 years through 81 years of age. However, you may need to be screened more often if early forms of precancerous polyps or small growths are found.  Skin Cancer  Check your skin from head to toe regularly.  Tell your health care provider about any new moles or changes in moles, especially if there is a change in a mole's shape or color.  Also tell your health care provider if you have a mole that is larger than the size of a pencil eraser.  Always use sunscreen. Apply sunscreen liberally and repeatedly throughout the day.  Protect yourself by wearing long sleeves, pants, a  wide-brimmed hat, and sunglasses whenever you are outside.  Heart disease, diabetes, and high blood pressure  High blood pressure causes heart disease and increases the risk of stroke. High blood pressure is more likely to develop in: ? People who have blood pressure in the high end of the normal range (130-139/85-89 mm Hg). ? People who are overweight or obese. ? People who are African American.  If you are 44-80 years of age, have your blood pressure checked every 3-5 years. If you are 58 years of age or older, have your blood pressure checked every year. You should have your blood pressure measured twice-once when you are at a hospital or clinic, and once when you are not at a hospital or clinic. Record the average of the two measurements. To check your blood pressure when you are not at a hospital or clinic, you can use: ? An automated blood pressure machine at a pharmacy. ? A home blood pressure monitor.  If you are between 54 years and 21 years old, ask your health care provider if you should take aspirin to prevent strokes.  Have regular diabetes screenings. This involves taking a blood sample to check your fasting blood sugar level. ? If you are at a normal weight and have a low risk for diabetes, have this test once every three years after 81 years of age. ? If you are overweight and have a high risk for diabetes, consider being tested at a younger age or more often. Preventing infection Hepatitis B  If you have a higher risk for hepatitis B, you should be screened for this virus. You are considered at high risk for hepatitis B if: ? You were born in a country where hepatitis B is common. Ask your health care provider which countries are considered high risk. ? Your parents were born in a high-risk country, and you have not been immunized against hepatitis B (hepatitis B vaccine). ? You have HIV or AIDS. ? You use needles to inject street drugs. ? You live with someone who has  hepatitis B. ? You have had sex with someone who has hepatitis B. ? You get hemodialysis treatment. ? You take certain medicines for conditions, including cancer, organ transplantation, and autoimmune conditions.  Hepatitis C  Blood testing is recommended for: ? Everyone born from [birth year] through 1963. ? Anyone with known risk factors for hepatitis C.  Sexually transmitted infections (  STIs)  You should be screened for sexually transmitted infections (STIs) including gonorrhea and chlamydia if: ? You are sexually active and are younger than 81 years of age. ? You are older than 81 years of age and your health care provider tells you that you are at risk for this type of infection. ? Your sexual activity has changed since you were last screened and you are at an increased risk for chlamydia or gonorrhea. Ask your health care provider if you are at risk.  If you do not have HIV, but are at risk, it may be recommended that you take a prescription medicine daily to prevent HIV infection. This is called pre-exposure prophylaxis (PrEP). You are considered at risk if: ? You are sexually active and do not regularly use condoms or know the HIV status of your partner(s). ? You take drugs by injection. ? You are sexually active with a partner who has HIV.  Talk with your health care provider about whether you are at high risk of being infected with HIV. If you choose to begin PrEP, you should first be tested for HIV. You should then be tested every 3 months for as long as you are taking PrEP. Pregnancy  If you are premenopausal and you may become pregnant, ask your health care provider about preconception counseling.  If you may become pregnant, take 400 to 800 micrograms (mcg) of folic acid every day.  If you want to prevent pregnancy, talk to your health care provider about birth control (contraception). Osteoporosis and menopause  Osteoporosis is a disease in which the bones lose minerals and  strength with aging. This can result in serious bone fractures. Your risk for osteoporosis can be identified using a bone density scan.  If you are 76 years of age or older, or if you are at risk for osteoporosis and fractures, ask your health care provider if you should be screened.  Ask your health care provider whether you should take a calcium or vitamin D supplement to lower your risk for osteoporosis.  Menopause may have certain physical symptoms and risks.  Hormone replacement therapy may reduce some of these symptoms and risks. Talk to your health care provider about whether hormone replacement therapy is right for you. Follow these instructions at home:  Schedule regular health, dental, and eye exams.  Stay current with your immunizations.  Do not use any tobacco products including cigarettes, chewing tobacco, or electronic cigarettes.  If you are pregnant, do not drink alcohol.  If you are breastfeeding, limit how much and how often you drink alcohol.  Limit alcohol intake to no more than 1 drink per day for nonpregnant women. One drink equals 12 ounces of beer, 5 ounces of wine, or 1 ounces of hard liquor.  Do not use street drugs.  Do not share needles.  Ask your health care provider for help if you need support or information about quitting drugs.  Tell your health care provider if you often feel depressed.  Tell your health care provider if you have ever been abused or do not feel safe at home. This information is not intended to replace advice given to you by your health care provider. Make sure you discuss any questions you have with your health care provider. Document Released: 10/14/2010 Document Revised: 09/06/2015 Document Reviewed: 01/02/2015 Elsevier Interactive Patient Education  [employer]. It is important to avoid accidents which may result in broken bones.  Here are a few ideas on how  to make your home safer so you will be less likely to trip or  fall.  1. Use nonskid mats or non slip strips in your shower or tub, on your bathroom floor and around sinks.  If you know that you have spilled water, wipe it up! 2. In the bathroom, it is important to have properly installed grab bars on the walls or on the edge of the tub.  Towel racks are NOT strong enough for you to hold onto or to pull on for support. 3. Stairs and hallways should have enough light.  Add lamps or night lights if you need ore light. 4. It is good to have handrails on both sides of the stairs if possible.  Always fix broken handrails right away. 5. It is important to see the edges of steps.  Paint the edges of outdoor steps white so you can see them better.  Put colored tape on the edge of inside steps. 6. Throw-rugs are dangerous because they can slide.  Removing the rugs is the best idea, but if they must stay, add adhesive carpet tape to prevent slipping. 7. Do not keep things on stairs or in the halls.  Remove small furniture that blocks the halls as it may cause you to trip.  Keep telephone and electrical cords out of the way where you walk. 8. Always were sturdy, rubber-soled shoes for good support.  Never wear just socks, especially on the stairs.  Socks may cause you to slip or fall.  Do not wear full-length housecoats as you can easily trip on the bottom.  9. Place the things you use the most on the shelves that are the easiest to reach.  If you use a stepstool, make sure it is in good condition.  If you feel unsteady, DO NOT climb, ask for help. 10. If a health professional advises you to use a cane or walker, do not be ashamed.  These items can keep you from falling and breaking your bones.

## 2017-10-29 NOTE — Assessment & Plan Note (Signed)
Labs

## 2017-10-29 NOTE — Assessment & Plan Note (Signed)
Metoprolol, Norvasc, Lasix 

## 2017-10-29 NOTE — Progress Notes (Signed)
Subjective:  Patient ID: [name], female    DOB: 07-Aug-1934  Age: 83 y.o. MRN: [id number]  CC: No chief complaint on file.   HPI [name] presents for a well exam F/u HTN, CAD, OA f/u C/o L ear pain - better  Outpatient Medications Prior to Visit  Medication Sig Dispense Refill  . ACCU-CHEK FASTCLIX LANCETS MISC Use to check blood sugars twice a day Dx E11.9 102 each 2  . ALPRAZolam (XANAX) 0.5 MG tablet TAKE 1/2 TO 1 TABLET BY MOUTH 3 TIMES DAILY AS NEEDED FOR ANXIETY 90 tablet 0  . amLODipine (NORVASC) 5 MG tablet TAKE 1 TABLET BY MOUTH ONCE DAILY 90 tablet 3  . aspirin 81 MG tablet Take 81 mg by mouth daily.      . Blood Glucose Monitoring Suppl (ACCU-CHEK AVIVA PLUS) w/Device KIT Use to check blood sugars twice a day Dx E11.9 1 kit 0  . Cholecalciferol (VITAMIN D) 2000 units tablet take 1 tablet by mouth once daily 100 tablet 2  . clidinium-chlordiazePOXIDE (LIBRAX) 5-2.5 MG capsule take 1 capsule by mouth three times a day if needed for CRAMPS 60 capsule 2  . D3 SUPER STRENGTH 2000 units CAPS TAKE 1 CAPSULE BY MOUTH EVERY DAY 100 capsule 0  . diclofenac sodium (VOLTAREN) 1 % GEL Apply 2 g topically 4 (four) times daily. Apply to left ankle four times per day per Dr. Samara Snide    . fluocinonide cream (LIDEX) 0.05 % Apply twice daily to rash as needed 30 g 5  . furosemide (LASIX) 20 MG tablet Take 1 tablet (20 mg total) by mouth daily as needed for edema. 30 tablet 3  . glucose blood (ACCU-CHEK AVIVA PLUS) test strip Use to check blood sugars twice a day 100 each 2  . hydroxypropyl methylcellulose (ISOPTO TEARS) 2.5 % ophthalmic solution Place 1 drop into both eyes 2 (two) times daily.      . hydrOXYzine (ATARAX/VISTARIL) 25 MG tablet Take 1 tablet (25 mg total) by mouth every 8 (eight) hours as needed for itching. 60 tablet 0  . isosorbide mononitrate (IMDUR) 30 MG 24 hr tablet TAKE 1 TABLET BY MOUTH ONCE DAILY 30 tablet 11  . loperamide (IMODIUM A-D) 2 MG tablet Take  1 tablet (2 mg total) by mouth 4 (four) times daily as needed for diarrhea or loose stools. 30 tablet 0  . metoprolol succinate (TOPROL-XL) 25 MG 24 hr tablet TAKE 1 AND 1/2 TABLET TWICE A DAY 270 tablet 3  . neomycin-polymyxin-hydrocortisone (CORTISPORIN) otic solution Place 4 drops into the left ear 3 (three) times daily. 10 mL 0  . pantoprazole (PROTONIX) 40 MG tablet take 1 tablet by mouth once daily 90 tablet 3  . simvastatin (ZOCOR) 20 MG tablet Take 1 tablet (20 mg total) by mouth at bedtime. 90 tablet 1  . traMADol (ULTRAM) 50 MG tablet take 1 tablet by mouth three times a day 90 tablet 2  . triamcinolone cream (KENALOG) 0.1 % Apply 1 application topically 2 (two) times daily. 30 g 0  . warfarin (COUMADIN) 5 MG tablet TAKE 1 TABLET BY MOUTH ONCE DAILY OR AS DIRECTED BY COUMADIN CLINIC 90 tablet 1   No facility-administered medications prior to visit.     ROS: Review of Systems  Constitutional: Negative for activity change, appetite change, chills, fatigue and unexpected weight change.  HENT: Negative for congestion, mouth sores and sinus pressure.   Eyes: Negative for visual disturbance.  Respiratory: Negative for cough and  chest tightness.   Gastrointestinal: Negative for abdominal pain and nausea.  Genitourinary: Negative for difficulty urinating, frequency and vaginal pain.  Musculoskeletal: Positive for arthralgias. Negative for back pain and gait problem.  Skin: Negative for pallor and rash.  Neurological: Negative for dizziness, tremors, weakness, numbness and headaches.  Psychiatric/Behavioral: Negative for confusion and sleep disturbance.    Objective:  BP 126/72 (BP Location: Left Arm, Patient Position: Sitting, Cuff Size: Large)   Pulse 62   Temp 98.6 F (37 C) (Oral)   Ht '5\' 6"'$  (1.676 m)   Wt 187 lb (84.8 kg)   SpO2 98%   BMI 30.18 kg/m   BP Readings from Last 3 Encounters:  10/29/17 126/72  04/28/17 136/68  04/27/17 134/78    Wt Readings from Last 3  Encounters:  10/29/17 187 lb (84.8 kg)  04/28/17 188 lb (85.3 kg)  04/27/17 189 lb (85.7 kg)    Physical Exam  Constitutional: She appears well-developed. No distress.  HENT:  Head: Normocephalic.  Right Ear: External ear normal.  Left Ear: External ear normal.  Nose: Nose normal.  Mouth/Throat: Oropharynx is clear and moist.  Eyes: Pupils are equal, round, and reactive to light. Conjunctivae are normal. Right eye exhibits no discharge. Left eye exhibits no discharge.  Neck: Normal range of motion. Neck supple. No JVD present. No tracheal deviation present. No thyromegaly present.  Cardiovascular: Normal rate, regular rhythm and normal heart sounds.  Pulmonary/Chest: No stridor. No respiratory distress. She has no wheezes.  Abdominal: Soft. Bowel sounds are normal. She exhibits no distension and no mass. There is no tenderness. There is no rebound and no guarding.  Musculoskeletal: She exhibits no edema or tenderness.  Lymphadenopathy:    She has no cervical adenopathy.  Neurological: She displays normal reflexes. No cranial nerve deficit. She exhibits normal muscle tone. Coordination normal.  Skin: No rash noted. No erythema.  Psychiatric: She has a normal mood and affect. Her behavior is normal. Judgment and thought content normal.    Lab Results  Component Value Date   WBC 5.0 04/27/2017   HGB 13.0 04/27/2017   HCT 39.6 04/27/2017   PLT 225.0 04/27/2017   GLUCOSE 120 (H) 09/16/2016   CHOL 193 04/27/2017   TRIG 105.0 04/27/2017   HDL 70.00 04/27/2017   LDLDIRECT 105.5 11/26/2010   LDLCALC 102 (H) 04/27/2017   ALT 10 04/27/2017   AST 14 04/27/2017   NA 137 09/16/2016   K 4.3 09/16/2016   CL 103 09/16/2016   CREATININE 1.01 09/16/2016   BUN 14 09/16/2016   CO2 29 09/16/2016   TSH 1.96 04/27/2017   INR 2.7 09/29/2017   HGBA1C 7.2 (H) 04/27/2017    Mm Screening Breast Tomo Bilateral  Result Date: 09/09/2016 CLINICAL DATA:  Screening. EXAM: 2D DIGITAL SCREENING  BILATERAL MAMMOGRAM WITH CAD AND ADJUNCT TOMO COMPARISON:  Previous exam(s). ACR Breast Density Category b: There are scattered areas of fibroglandular density. FINDINGS: There are no findings suspicious for malignancy. Images were processed with CAD. IMPRESSION: No mammographic evidence of malignancy. A result letter of this screening mammogram will be mailed directly to the patient. RECOMMENDATION: Screening mammogram in one year. (Code:[id number]-B-01Y) BI-RADS CATEGORY  1: Negative. Electronically Signed   By: Everlean Alstrom M.D.   On: 09/09/2016 15:06    Assessment & Plan:   There are no diagnoses linked to this encounter.   No orders of the defined types were placed in this encounter.    Follow-up: No follow-ups on file.  Walker Kehr, MD

## 2017-10-29 NOTE — Assessment & Plan Note (Signed)
Simvastatin, Toprol, Isosorbide, Amlodipine 

## 2017-10-29 NOTE — Addendum Note (Signed)
Addended by: Emelia Loron A on: 10/29/2017 01:47 PM   Modules accepted: Orders

## 2017-10-29 NOTE — Telephone Encounter (Signed)
Error

## 2017-10-29 NOTE — Assessment & Plan Note (Signed)
No relapse on Rx

## 2017-11-02 ENCOUNTER — Other Ambulatory Visit (INDEPENDENT_AMBULATORY_CARE_PROVIDER_SITE_OTHER): Payer: Medicare HMO

## 2017-11-02 DIAGNOSIS — E119 Type 2 diabetes mellitus without complications: Secondary | ICD-10-CM

## 2017-11-02 DIAGNOSIS — Z0000 Encounter for general adult medical examination without abnormal findings: Secondary | ICD-10-CM | POA: Diagnosis not present

## 2017-11-02 LAB — URINALYSIS, ROUTINE W REFLEX MICROSCOPIC
Bilirubin Urine: NEGATIVE
Ketones, ur: NEGATIVE
Leukocytes, UA: NEGATIVE
Nitrite: NEGATIVE
PH: 6 (ref 5.0–8.0)
Specific Gravity, Urine: 1.02 (ref 1.000–1.030)
Total Protein, Urine: NEGATIVE
Urine Glucose: NEGATIVE
Urobilinogen, UA: 0.2 (ref 0.0–1.0)

## 2017-11-02 LAB — CBC WITH DIFFERENTIAL/PLATELET
Basophils Absolute: 0 10*3/uL (ref 0.0–0.1)
Basophils Relative: 0.6 % (ref 0.0–3.0)
EOS PCT: 1.4 % (ref 0.0–5.0)
Eosinophils Absolute: 0.1 10*3/uL (ref 0.0–0.7)
HCT: 37.8 % (ref 36.0–46.0)
Hemoglobin: 12.4 g/dL (ref 12.0–15.0)
LYMPHS ABS: 1.2 10*3/uL (ref 0.7–4.0)
Lymphocytes Relative: 29.7 % (ref 12.0–46.0)
MCHC: 32.8 g/dL (ref 30.0–36.0)
MCV: 87.9 fl (ref 78.0–100.0)
Monocytes Absolute: 0.4 10*3/uL (ref 0.1–1.0)
Monocytes Relative: 8.7 % (ref 3.0–12.0)
NEUTROS ABS: 2.5 10*3/uL (ref 1.4–7.7)
NEUTROS PCT: 59.6 % (ref 43.0–77.0)
PLATELETS: 226 10*3/uL (ref 150.0–400.0)
RBC: 4.3 Mil/uL (ref 3.87–5.11)
RDW: 13.7 % (ref 11.5–15.5)
WBC: 4.2 10*3/uL (ref 4.0–10.5)

## 2017-11-02 LAB — HEPATIC FUNCTION PANEL
ALBUMIN: 4.1 g/dL (ref 3.5–5.2)
ALT: 11 U/L (ref 0–35)
AST: 14 U/L (ref 0–37)
Alkaline Phosphatase: 63 U/L (ref 39–117)
Bilirubin, Direct: 0.1 mg/dL (ref 0.0–0.3)
Total Bilirubin: 0.4 mg/dL (ref 0.2–1.2)
Total Protein: 7.3 g/dL (ref 6.0–8.3)

## 2017-11-02 LAB — BASIC METABOLIC PANEL
BUN: 12 mg/dL (ref 6–23)
CHLORIDE: 103 meq/L (ref 96–112)
CO2: 29 meq/L (ref 19–32)
Calcium: 9 mg/dL (ref 8.4–10.5)
Creatinine, Ser: 1.1 mg/dL (ref 0.40–1.20)
GFR: 61.29 mL/min (ref >60.00)
GLUCOSE: 149 mg/dL — AB (ref 70–99)
POTASSIUM: 4 meq/L (ref 3.5–5.1)
SODIUM: 138 meq/L (ref 135–145)

## 2017-11-02 LAB — LIPID PANEL
CHOL/HDL RATIO: 3
Cholesterol: 162 mg/dL (ref 0–200)
HDL: 60.5 mg/dL (ref >39.00)
LDL Cholesterol: 88 mg/dL (ref 0–99)
NONHDL: 101.51
TRIGLYCERIDES: 69 mg/dL (ref 0.0–149.0)
VLDL: 13.8 mg/dL (ref 0.0–40.0)

## 2017-11-02 LAB — TSH: TSH: 0.74 u[IU]/mL (ref 0.35–4.50)

## 2017-11-02 LAB — HEMOGLOBIN A1C: HEMOGLOBIN A1C: 7.5 % — AB (ref 4.6–6.5)

## 2017-11-04 ENCOUNTER — Other Ambulatory Visit: Payer: Self-pay | Admitting: Internal Medicine

## 2017-11-04 DIAGNOSIS — R3129 Other microscopic hematuria: Secondary | ICD-10-CM

## 2017-11-04 NOTE — Telephone Encounter (Signed)
Rx sent to pharmacy   

## 2017-11-11 ENCOUNTER — Ambulatory Visit (INDEPENDENT_AMBULATORY_CARE_PROVIDER_SITE_OTHER): Payer: Medicare HMO | Admitting: Pharmacist

## 2017-11-11 DIAGNOSIS — I4891 Unspecified atrial fibrillation: Secondary | ICD-10-CM

## 2017-11-11 DIAGNOSIS — Z7901 Long term (current) use of anticoagulants: Secondary | ICD-10-CM

## 2017-11-11 LAB — POCT INR: INR: 2.2 (ref 2.0–3.0)

## 2017-12-16 DIAGNOSIS — R3121 Asymptomatic microscopic hematuria: Secondary | ICD-10-CM | POA: Diagnosis not present

## 2017-12-23 DIAGNOSIS — K429 Umbilical hernia without obstruction or gangrene: Secondary | ICD-10-CM | POA: Diagnosis not present

## 2017-12-23 DIAGNOSIS — N281 Cyst of kidney, acquired: Secondary | ICD-10-CM | POA: Diagnosis not present

## 2017-12-23 DIAGNOSIS — R3121 Asymptomatic microscopic hematuria: Secondary | ICD-10-CM | POA: Diagnosis not present

## 2017-12-23 DIAGNOSIS — E279 Disorder of adrenal gland, unspecified: Secondary | ICD-10-CM | POA: Diagnosis not present

## 2017-12-28 ENCOUNTER — Ambulatory Visit (INDEPENDENT_AMBULATORY_CARE_PROVIDER_SITE_OTHER): Payer: Medicare HMO | Admitting: Pharmacist

## 2017-12-28 DIAGNOSIS — I481 Persistent atrial fibrillation: Secondary | ICD-10-CM | POA: Diagnosis not present

## 2017-12-28 DIAGNOSIS — Z7901 Long term (current) use of anticoagulants: Secondary | ICD-10-CM | POA: Diagnosis not present

## 2017-12-28 DIAGNOSIS — I4891 Unspecified atrial fibrillation: Secondary | ICD-10-CM | POA: Diagnosis not present

## 2017-12-28 DIAGNOSIS — I4819 Other persistent atrial fibrillation: Secondary | ICD-10-CM

## 2017-12-28 LAB — POCT INR: INR: 2.4 (ref 2.0–3.0)

## 2018-01-07 ENCOUNTER — Ambulatory Visit (HOSPITAL_COMMUNITY)
Admission: RE | Admit: 2018-01-07 | Discharge: 2018-01-07 | Disposition: A | Discharge status: Home / Self Care | Payer: Medicare HMO | Source: Ambulatory Visit | Attending: Urology | Admitting: Urology

## 2018-01-07 ENCOUNTER — Other Ambulatory Visit: Payer: Self-pay | Admitting: Urology

## 2018-01-07 DIAGNOSIS — D4101 Neoplasm of uncertain behavior of right kidney: Secondary | ICD-10-CM

## 2018-01-07 DIAGNOSIS — D3501 Benign neoplasm of right adrenal gland: Secondary | ICD-10-CM | POA: Diagnosis not present

## 2018-01-07 DIAGNOSIS — C649 Malignant neoplasm of unspecified kidney, except renal pelvis: Secondary | ICD-10-CM | POA: Diagnosis not present

## 2018-01-07 DIAGNOSIS — R3121 Asymptomatic microscopic hematuria: Secondary | ICD-10-CM | POA: Diagnosis not present

## 2018-01-07 DIAGNOSIS — N281 Cyst of kidney, acquired: Secondary | ICD-10-CM | POA: Diagnosis not present

## 2018-01-09 ENCOUNTER — Other Ambulatory Visit: Payer: Self-pay | Admitting: Internal Medicine

## 2018-01-11 ENCOUNTER — Other Ambulatory Visit: Payer: Self-pay

## 2018-01-11 MED ORDER — VITAMIN D 50 MCG (2000 UT) PO TABS: 2000.0000 [IU] | ORAL_TABLET | Freq: Every day | ORAL | 2 refills | Status: DC

## 2018-01-21 ENCOUNTER — Other Ambulatory Visit: Payer: Self-pay | Admitting: Internal Medicine

## 2018-01-21 DIAGNOSIS — Z1231 Encounter for screening mammogram for malignant neoplasm of breast: Secondary | ICD-10-CM

## 2018-01-26 ENCOUNTER — Other Ambulatory Visit: Payer: Self-pay | Admitting: Internal Medicine

## 2018-02-01 ENCOUNTER — Other Ambulatory Visit: Payer: Self-pay | Admitting: Internal Medicine

## 2018-02-08 ENCOUNTER — Ambulatory Visit (INDEPENDENT_AMBULATORY_CARE_PROVIDER_SITE_OTHER): Payer: Medicare HMO | Admitting: Pharmacist

## 2018-02-08 DIAGNOSIS — I4891 Unspecified atrial fibrillation: Secondary | ICD-10-CM

## 2018-02-08 DIAGNOSIS — Z7901 Long term (current) use of anticoagulants: Secondary | ICD-10-CM | POA: Diagnosis not present

## 2018-02-08 LAB — POCT INR: INR: 2.5 (ref 2.0–3.0)

## 2018-02-10 ENCOUNTER — Ambulatory Visit (INDEPENDENT_AMBULATORY_CARE_PROVIDER_SITE_OTHER): Payer: Medicare HMO

## 2018-02-10 DIAGNOSIS — Z23 Encounter for immunization: Secondary | ICD-10-CM | POA: Diagnosis not present

## 2018-02-26 ENCOUNTER — Ambulatory Visit
Admission: RE | Admit: 2018-02-26 | Discharge: 2018-02-26 | Disposition: A | Discharge status: Home / Self Care | Payer: Medicare HMO | Source: Ambulatory Visit | Attending: Internal Medicine | Admitting: Internal Medicine

## 2018-02-26 DIAGNOSIS — Z1231 Encounter for screening mammogram for malignant neoplasm of breast: Secondary | ICD-10-CM

## 2018-03-22 ENCOUNTER — Ambulatory Visit (INDEPENDENT_AMBULATORY_CARE_PROVIDER_SITE_OTHER): Payer: Medicare HMO | Admitting: Pharmacist Clinician (PhC)/ Clinical Pharmacy Specialist

## 2018-03-22 DIAGNOSIS — Z7901 Long term (current) use of anticoagulants: Secondary | ICD-10-CM

## 2018-03-22 DIAGNOSIS — I4891 Unspecified atrial fibrillation: Secondary | ICD-10-CM | POA: Diagnosis not present

## 2018-03-22 LAB — POCT INR: INR: 2.9 (ref 2.0–3.0)

## 2018-04-05 ENCOUNTER — Other Ambulatory Visit: Payer: Self-pay | Admitting: Internal Medicine

## 2018-04-05 DIAGNOSIS — I480 Paroxysmal atrial fibrillation: Secondary | ICD-10-CM

## 2018-04-09 ENCOUNTER — Encounter: Payer: Self-pay | Admitting: Internal Medicine

## 2018-04-09 ENCOUNTER — Ambulatory Visit (INDEPENDENT_AMBULATORY_CARE_PROVIDER_SITE_OTHER): Payer: Medicare HMO | Admitting: Internal Medicine

## 2018-04-09 ENCOUNTER — Other Ambulatory Visit: Payer: Self-pay | Admitting: Internal Medicine

## 2018-04-09 VITALS — BP 120/80 | HR 53 | Temp 98.5°F | Ht 66.0 in | Wt 187.0 lb

## 2018-04-09 DIAGNOSIS — J069 Acute upper respiratory infection, unspecified: Secondary | ICD-10-CM

## 2018-04-09 MED ORDER — FLUTICASONE PROPIONATE 50 MCG/ACT NA SUSP: 2.0000 | Freq: Every day | NASAL | 0 refills | Status: DC

## 2018-04-09 MED ORDER — BENZONATATE 200 MG PO CAPS: 200.0000 mg | ORAL_CAPSULE | Freq: Three times a day (TID) | ORAL | 0 refills | Status: DC | PRN

## 2018-04-09 NOTE — Patient Instructions (Addendum)
We would like you to start taking flonase which is a nose spray to use 2 sprays in each nostril once a day for the next 1-2 weeks.   We have sent in a cough medicine to take up to 3 times per day called tessalon perles.   Viral Respiratory Infection A viral respiratory infection is an illness that affects parts of the body that are used for breathing. These include the lungs, nose, and throat. It is caused by a germ called a virus. Some examples of this kind of infection are:  A cold.  The flu (influenza).  A respiratory syncytial virus (RSV) infection. A person who gets this illness may have the following symptoms:  A stuffy or runny nose.  Yellow or green fluid in the nose.  A cough.  Sneezing.  Tiredness (fatigue).  Achy muscles.  A sore throat.  Sweating or chills.  A fever.  A headache. Follow these instructions at home: Managing pain and congestion  Take over-the-counter and prescription medicines only as told by your doctor.  If you have a sore throat, gargle with salt water. Do this 3-4 times per day or as needed. To make a salt-water mixture, dissolve -1 tsp of salt in 1 cup of warm water. Make sure that all the salt dissolves.  Use nose drops made from salt water. This helps with stuffiness (congestion). It also helps soften the skin around your nose.  Drink enough fluid to keep your pee (urine) pale yellow. General instructions   Rest as much as possible.  Do not drink alcohol.  Do not use any products that have nicotine or tobacco, such as cigarettes and e-cigarettes. If you need help quitting, ask your doctor.  Keep all follow-up visits as told by your doctor. This is important. How is this prevented?   Get a flu shot every year. Ask your doctor when you should get your flu shot.  Do not let other people get your germs. If you are sick: ? Stay home from work or school. ? Wash your hands with soap and water often. Wash your hands after you  cough or sneeze. If soap and water are not available, use hand sanitizer.  Avoid contact with people who are sick during cold and flu season. This is in fall and winter. Get help if:  Your symptoms last for 10 days or longer.  Your symptoms get worse over time.  You have a fever.  You have very bad pain in your face or forehead.  Parts of your jaw or neck become very swollen. Get help right away if:  You feel pain or pressure in your chest.  You have shortness of breath.  You faint or feel like you will faint.  You keep throwing up (vomiting).  You feel confused. Summary  A viral respiratory infection is an illness that affects parts of the body that are used for breathing.  Examples of this illness include a cold, the flu, and respiratory syncytial virus (RSV) infection.  The infection can cause a runny nose, cough, sneezing, sore throat, and fever.  Follow what your doctor tells you about taking medicines, drinking lots of fluid, washing your hands, resting at home, and avoiding people who are sick. This information is not intended to replace advice given to you by your health care provider. Make sure you discuss any questions you have with your health care provider. Document Released: 03/13/2008 Document Revised: 05/11/2017 Document Reviewed: 05/11/2017 Elsevier Interactive Patient Education  2019  [employer].

## 2018-04-09 NOTE — Progress Notes (Signed)
   Subjective:   Patient ID: [name], female    DOB: 12-22-1934, 83 y.o.   MRN: [id number]  HPI The patient is a 83 y.o. female coming in for cold symptoms. Started about 1-2 days ago. Main symptoms are: sneezing, sore throat and coughing. Denies SOB, fevers or chills. She is having some headache and achiness and fatigue. Overall it is worsening. Has tried nothing for it. Denies known sick contacts.   Review of Systems  Constitutional: Positive for activity change and appetite change. Negative for chills, fatigue, fever and unexpected weight change.  HENT: Positive for congestion, postnasal drip, rhinorrhea and sinus pressure. Negative for ear discharge, ear pain, sinus pain, sneezing, sore throat, tinnitus, trouble swallowing and voice change.   Eyes: Negative.   Respiratory: Positive for cough. Negative for chest tightness, shortness of breath and wheezing.   Cardiovascular: Negative.   Gastrointestinal: Negative.   Musculoskeletal: Positive for myalgias.  Neurological: Negative.     Objective:  Physical Exam Constitutional:      Appearance: She is well-developed.  HENT:     Head: Normocephalic and atraumatic.     Comments: Oropharynx with redness and clear drainage, nose with swollen turbinates, TMs normal bilaterally.  Neck:     Musculoskeletal: Normal range of motion.     Thyroid: No thyromegaly.  Cardiovascular:     Rate and Rhythm: Normal rate and regular rhythm.  Pulmonary:     Effort: Pulmonary effort is normal. No respiratory distress.     Breath sounds: Normal breath sounds. No wheezing or rales.  Abdominal:     Palpations: Abdomen is soft.  Musculoskeletal:        General: Tenderness present.  Lymphadenopathy:     Cervical: No cervical adenopathy.  Skin:    General: Skin is warm and dry.  Neurological:     Mental Status: She is alert and oriented to person, place, and time.     Vitals:   04/09/18 1459  BP: 120/80  Pulse: (!) 53  Temp: 98.5 F  (36.9 C)  TempSrc: Oral  SpO2: 98%  Weight: 187 lb (84.8 kg)  Height: 5\' 6"  (1.676 m)    Assessment & Plan:

## 2018-04-09 NOTE — Assessment & Plan Note (Signed)
Rx for flonase and tessalon perles. No indication for steroids or antibiotics.

## 2018-04-15 DIAGNOSIS — J019 Acute sinusitis, unspecified: Secondary | ICD-10-CM | POA: Diagnosis not present

## 2018-04-20 DIAGNOSIS — H353121 Nonexudative age-related macular degeneration, left eye, early dry stage: Secondary | ICD-10-CM | POA: Diagnosis not present

## 2018-04-20 DIAGNOSIS — H02834 Dermatochalasis of left upper eyelid: Secondary | ICD-10-CM | POA: Diagnosis not present

## 2018-04-20 DIAGNOSIS — H02831 Dermatochalasis of right upper eyelid: Secondary | ICD-10-CM | POA: Diagnosis not present

## 2018-04-20 DIAGNOSIS — E119 Type 2 diabetes mellitus without complications: Secondary | ICD-10-CM | POA: Diagnosis not present

## 2018-04-20 DIAGNOSIS — H25813 Combined forms of age-related cataract, bilateral: Secondary | ICD-10-CM | POA: Diagnosis not present

## 2018-04-20 DIAGNOSIS — H04123 Dry eye syndrome of bilateral lacrimal glands: Secondary | ICD-10-CM | POA: Diagnosis not present

## 2018-04-29 ENCOUNTER — Ambulatory Visit: Payer: Medicare HMO | Admitting: Internal Medicine

## 2018-04-29 ENCOUNTER — Ambulatory Visit (INDEPENDENT_AMBULATORY_CARE_PROVIDER_SITE_OTHER): Payer: Medicare HMO | Admitting: Pharmacist

## 2018-04-29 ENCOUNTER — Encounter: Payer: Self-pay | Admitting: Internal Medicine

## 2018-04-29 VITALS — BP 128/72 | HR 51 | Ht 66.0 in | Wt 188.0 lb

## 2018-04-29 DIAGNOSIS — I4891 Unspecified atrial fibrillation: Secondary | ICD-10-CM

## 2018-04-29 DIAGNOSIS — E785 Hyperlipidemia, unspecified: Secondary | ICD-10-CM

## 2018-04-29 DIAGNOSIS — I480 Paroxysmal atrial fibrillation: Secondary | ICD-10-CM | POA: Diagnosis not present

## 2018-04-29 DIAGNOSIS — Z7901 Long term (current) use of anticoagulants: Secondary | ICD-10-CM

## 2018-04-29 DIAGNOSIS — I2510 Atherosclerotic heart disease of native coronary artery without angina pectoris: Secondary | ICD-10-CM

## 2018-04-29 LAB — POCT INR: INR: 2.1 (ref 2.0–3.0)

## 2018-04-29 NOTE — Patient Instructions (Signed)
Medication Instructions:  TAKE simvastatin EVERY DAY If you need a refill on your cardiac medications before your next appointment, please call your pharmacy.   Lab work: Fasting lab work to check cholesterol in [place] If you have labs (blood work) drawn today and your tests are completely normal, you will receive your results only by: [name] MyChart Message (if you have MyChart) OR . A paper copy in the mail If you have any lab test that is abnormal or we need to change your treatment, we will call you to review the results.  Follow-Up: At [facility], you and your health needs are our priority.  As part of our continuing mission to provide you with exceptional heart care, we have created designated Provider Care Teams.  These Care Teams include your primary Cardiologist (physician) and Advanced Practice Providers (APPs -  Physician Assistants and Nurse Practitioners) who all work together to provide you with the care you need, when you need it. You will need a follow up appointment in 12 months.  Please call our office 2 months in advance to schedule this appointment.  You may see Pixie Casino, MD or one of the following Advanced Practice Providers on your designated Care Team: [place], [place] . Fabian Sharp, PA-C  Any Other Special Instructions Will Be Listed Below (If Applicable).

## 2018-04-29 NOTE — Progress Notes (Signed)
OFFICE NOTE  Chief Complaint:  No complaints  Primary Care Physician: Cassandria Anger, MD  HPI:  [name]  is a 84 year old female with a history of paroxysmal atrial fibrillation and SVT in the past. This happened when she underwent a Myoview for chest pain in the hospital. She was placed on metoprolol succinate 25 b.i.d. and has done well with that with improvement in her palpitations. Her echo showed an EF greater than 55% with some mild aortic insufficiency and an aneurysmal atrial septum. She is on simvastatin as well as Coumadin managed by you. She's recently had some abnormal labs which showed borderline diabetes. She's not currently a medication for that. She reports occasional intermittent chest pain. She underwent a cardia catheterization in the past which showed mild to moderate nonobstructive coronary disease which is managed medically. She does also have bilateral venous insufficiency of the lower chin and knees and has worn compression stockings with improvement in her swelling and symptoms.  [name] returns today without any new complaints. She denies any shortness of breath or chest pain. She does get some occasional chest wall pain and is relieved with tramadol.  I saw [name] back in the office today. Overall she reports she is doing very well. She's had very few episodes of palpitations. She has had a decreased dose in her amlodipine to 5 mg daily, likely due to leg swelling. She is now taking Lasix as needed for edema. She's had a few episodes of racing heart for which she is to perform vagal maneuvers successfully. EKG shows sinus rhythm with PVCs and PACs but no evidence of atrial fibrillation in the office today. She is on warfarin anticoagulation. We will check the INR in the office today.  [name] returns today for follow-up. Unfortunately her son died this past 06/17/22 of leukemia. She says he's been in and out of the hospital for several  months. She has had it small increase in palpitations recently but seems to be controlled with taking and a half dose of metoprolol. She denies any chest pain although has some pain behind her right shoulder blade. This is intermittent and has improved. Is not associated with exertion or relieved by rest. Her EKG today shows normal sinus rhythm without ischemia. Recent A1c was 6.8-she is not on diabetes medications. I do not see a recent lipid profile though she is on simvastatin 20 mg every OD.  04/24/2016  [name] returns today for follow-up. She has no new complaints. It's been about a year since she had the loss of her son due to leukemia. She seems to be doing fairly well although is routinely very calm and demeanor. Her blood pressure is well-controlled. Her INR was therapeutic today. She denies any worsening swelling, chest pain or shortness of breath. She denies any bleeding problems. She's not had any recurrent atrial fibrillation or palpitations that she is aware of. Her last echo was in 2012 and at the time she had mild nonobstructive coronary disease by catheterization. She was also noted to have moderate tricuspid regurgitation and some LVH. Her EKG today shows moderate LVH by voltage criteria.  04/28/2017  [name] returns today for follow-up.  She denies any chest pain or worsening shortness of breath.  She saw her primary care provider yesterday no medicine changes were made.  She is contemplating removing a small mass on the left inner thigh.  He has an appointment with a surgeon coming up and we may  need to hold her warfarin for that.  She has had no significant recurrent atrial fibrillation.  She is in sinus today.  She prefers to stay on warfarin.  INR has been therapeutic and is adjusted every 6 weeks.  04/29/2018  [name] seen today for routine annual follow-up.  Overall she is without complaints.  Her warfarin testing has been stable.  Blood pressures well controlled  today.  She denies chest pain or worsening shortness of breath.  Cholesterol has been at goal.  EKG personally reviewed shows sinus bradycardia and voltage criteria for LVH at 51.  PMHx:  Past Medical History:  Diagnosis Date  . Anxiety state, unspecified   . Bronchitis, not specified as acute or chronic   . Diaphragmatic hernia without mention of obstruction or gangrene   . Dyslipidemia   . Essential hypertension   . Irritable bowel syndrome   . Mitral valve disorders(424.0)    a. 08/2010 Echo: EF >55%, mild MR, mod TR, trace AI.  [name] Non-obstructive CAD    a. 07/2010 Lexiscan MV: no ischemia, EF 78%; b. 07/2010 Cath: LM nl, LAD50/60p, 40 apical, LCX nl, RCA dominant, 30/40p, 15m  . Osteoarthrosis, unspecified whether generalized or localized, unspecified site   . Osteoporosis, unspecified   . Other abnormal glucose   . PAF (paroxysmal atrial fibrillation) (HCC)    a. CHA2DS2VASc = 4-->coumadin.  .Marland KitchenPhlebitis and thrombophlebitis of superficial vessels of lower extremities   . [place] ([place]ricular tachycardia) [place]    a. 05/2012 Holter: short bursts of PSVT noted-->Managed with toprol.  .Marland KitchenUnspecified venous (peripheral) insufficiency   . Unspecified vitamin D deficiency     Past Surgical History:  Procedure Laterality Date  . CARDIAC CATHETERIZATION  07/30/2010   nonobstructive CAD, 20-40% RCA stenosis, 50-60% eccentric LAD stenosis more prox, 40% LAD stenosis more distal, EF 65%  . TRANSTHORACIC ECHOCARDIOGRAM  09/03/2010   EF=>55%; mild MR; mod TR; AV mildly sclerotic with trace regurg; mild pulm valve regurg  . VESICOVAGINAL FISTULA CLOSURE W/ TAH      FAMHx:  Family History  Problem Relation Age of Onset  . Stroke Mother   . Stroke Father        also heart disease  . Clotting disorder Paternal Grandmother        blood clot  . Stroke Brother        also heart disease    SOCHx:   reports that she has never smoked. She has never used smokeless tobacco. She  reports that she does not drink alcohol or use drugs.  ALLERGIES:  Allergies  Allergen Reactions  . Clarithromycin     REACTION: unspecified  . Fosamax [Alendronate Sodium]     achy  . Guaifenesin Er     itching  . Metformin     REACTION: pt states INTOL to Metformin "felt drained"  . Tape     Adhesive tape---rash    ROS: Pertinent items noted in HPI and remainder of comprehensive ROS otherwise negative.  HOME MEDS: Current Outpatient Medications  Medication Sig Dispense Refill  . ACCU-CHEK FASTCLIX LANCETS MISC Use to check blood sugars twice a day Dx E11.9 102 each 2  . ALPRAZolam (XANAX) 0.5 MG tablet TAKE 1/2 TO 1 TABLET BY MOUTH THREE TIMES DAILY AS NEEDED FOR ANXIETY DO NOT TAKE WITH TRAMADOL 90 tablet 1  . amLODipine (NORVASC) 5 MG tablet TAKE 1 TABLET BY MOUTH ONCE DAILY 90 tablet 3  . aspirin 81 MG tablet Take 81  mg by mouth daily.      . benzonatate (TESSALON) 200 MG capsule Take 1 capsule (200 mg total) by mouth 3 (three) times daily as needed. 60 capsule 0  . Blood Glucose Monitoring Suppl (ACCU-CHEK AVIVA PLUS) w/Device KIT Use to check blood sugars twice a day Dx E11.9 1 kit 0  . Cholecalciferol (VITAMIN D) 2000 units tablet Take 1 tablet (2,000 Units total) by mouth daily. 100 tablet 2  . clidinium-chlordiazePOXIDE (LIBRAX) 5-2.5 MG capsule TAKE ONE CAPSULE BY MOUTH THREE TIMES DAILY AS NEEDED FOR CRAMPS 60 capsule 2  . D3 SUPER STRENGTH 2000 units CAPS TAKE 1 CAPSULE BY MOUTH EVERY DAY 100 capsule 0  . fluocinonide cream (LIDEX) 0.05 % Apply twice daily to rash as needed 30 g 5  . fluticasone (FLONASE) 50 MCG/ACT nasal spray SHAKE LIQUID AND USE 2 SPRAYS IN EACH NOSTRIL DAILY 48 g 0  . furosemide (LASIX) 20 MG tablet Take 1 tablet (20 mg total) by mouth daily as needed for edema. 30 tablet 3  . glucose blood (ACCU-CHEK AVIVA PLUS) test strip Use to check blood sugars twice a day 100 each 2  . hydroxypropyl methylcellulose (ISOPTO TEARS) 2.5 % ophthalmic solution  Place 1 drop into both eyes 2 (two) times daily.      . hydrOXYzine (ATARAX/VISTARIL) 25 MG tablet Take 1 tablet (25 mg total) by mouth every 8 (eight) hours as needed for itching. 60 tablet 0  . isosorbide mononitrate (IMDUR) 30 MG 24 hr tablet TAKE 1 TABLET BY MOUTH ONCE DAILY 30 tablet 11  . loperamide (IMODIUM A-D) 2 MG tablet Take 1 tablet (2 mg total) by mouth 4 (four) times daily as needed for diarrhea or loose stools. 30 tablet 0  . metoprolol succinate (TOPROL-XL) 25 MG 24 hr tablet TAKE 1 AND 1/2 TABLET TWICE A DAY 270 tablet 0  . pantoprazole (PROTONIX) 40 MG tablet take 1 tablet by mouth once daily 90 tablet 3  . simvastatin (ZOCOR) 20 MG tablet TAKE 1 TABLET BY MOUTH AT BEDTIME 90 tablet 3  . traMADol (ULTRAM) 50 MG tablet Take 1 tablet (50 mg total) by mouth 3 (three) times daily. 90 tablet 2  . triamcinolone cream (KENALOG) 0.1 % Apply 1 application topically 2 (two) times daily. 30 g 0  . warfarin (COUMADIN) 5 MG tablet TAKE 1 TABLET BY MOUTH ONCE DAILY OR AS DIRECTED BY COUMADIN CLINIC 90 tablet 1   No current facility-administered medications for this visit.     LABS/IMAGING: No results found for this or any previous visit (from the past 48 hour(s)). No results found.  VITALS: BP 128/72   Pulse (!) 51   Ht '5\' 6"'$  (1.676 m)   Wt 188 lb (85.3 kg)   BMI 30.34 kg/m   EXAM: General appearance: alert and no distress Neck: no carotid bruit, no JVD and thyroid not enlarged, symmetric, no tenderness/mass/nodules Lungs: clear to auscultation bilaterally Heart: regular rate and rhythm, S1, S2 normal, no murmur, click, rub or gallop Abdomen: soft, non-tender; bowel sounds normal; no masses,  no organomegaly Extremities: extremities normal, atraumatic, no cyanosis or edema Pulses: 2+ and symmetric Skin: Skin color, texture, turgor normal. No rashes or lesions Neurologic: Grossly normal Psych: Pleasant  EKG: Sinus bradycardia 51, minimal voltage criteria for LVH-personally  reviewed  ASSESSMENT: 1. Paroxysmal atrial fibrillation on warfarin - CHADSVASC score of 4 2. Hypertension-controlled 3. Dyslipidemia-on statin 4. Overweight 5. Moderate, non-obstructive CAD by cath in 2012 6. PSVT - controlled by b-blocker  and vagal maneuvers 7. LVH, mild to moderate TR by echo 2012  PLAN: 1.   Ms. Wiggins continues to do well without any new chest pain or worsening shortness of breath.  She has had no recurrent A. fib and remains on warfarin per her choice.  She has had no recent PSVT.  Her cholesterols been well controlled and blood pressure is at goal.  She did have a remote history of moderate nonobstructive coronary disease back in 2012.  Goal LDL is less than 70.  Plan to see her back annually or sooner as necessary.  Pixie Casino, MD, [facility], [place] Director of the Advanced Lipid Disorders &  Cardiovascular Risk Reduction Clinic Diplomate of the American Board of Clinical Lipidology Attending Cardiologist  Direct Dial: [phone]  Fax: [phone]  Website:  [email or web address] 04/29/2018, 10:30 AM

## 2018-05-04 ENCOUNTER — Other Ambulatory Visit: Payer: Self-pay | Admitting: Internal Medicine

## 2018-05-04 NOTE — Telephone Encounter (Signed)
Rx has been sent to the pharmacy electronically. ° °

## 2018-05-06 ENCOUNTER — Ambulatory Visit (INDEPENDENT_AMBULATORY_CARE_PROVIDER_SITE_OTHER): Payer: Medicare HMO | Admitting: Internal Medicine

## 2018-05-06 ENCOUNTER — Other Ambulatory Visit (INDEPENDENT_AMBULATORY_CARE_PROVIDER_SITE_OTHER): Payer: Medicare HMO

## 2018-05-06 ENCOUNTER — Encounter: Payer: Self-pay | Admitting: Internal Medicine

## 2018-05-06 VITALS — BP 126/80 | HR 51 | Temp 98.2°F | Ht 66.0 in | Wt 184.0 lb

## 2018-05-06 DIAGNOSIS — E119 Type 2 diabetes mellitus without complications: Secondary | ICD-10-CM | POA: Diagnosis not present

## 2018-05-06 DIAGNOSIS — I480 Paroxysmal atrial fibrillation: Secondary | ICD-10-CM

## 2018-05-06 DIAGNOSIS — F419 Anxiety disorder, unspecified: Secondary | ICD-10-CM

## 2018-05-06 LAB — BASIC METABOLIC PANEL
BUN: 13 mg/dL (ref 6–23)
CO2: 30 mEq/L (ref 19–32)
Calcium: 9 mg/dL (ref 8.4–10.5)
Chloride: 102 mEq/L (ref 96–112)
Creatinine, Ser: 0.98 mg/dL (ref 0.40–1.20)
GFR: 65.81 mL/min (ref >60.00)
Glucose, Bld: 132 mg/dL — ABNORMAL HIGH (ref 70–99)
Potassium: 3.9 mEq/L (ref 3.5–5.1)
Sodium: 138 mEq/L (ref 135–145)

## 2018-05-06 LAB — TSH: TSH: 1.55 u[IU]/mL (ref 0.35–4.50)

## 2018-05-06 LAB — HEMOGLOBIN A1C: Hgb A1c MFr Bld: 7.2 % — ABNORMAL HIGH (ref 4.6–6.5)

## 2018-05-06 NOTE — Assessment & Plan Note (Signed)
A1c On diet

## 2018-05-06 NOTE — Assessment & Plan Note (Addendum)
Paroxysmal atrial fibrillation on warfarin - CHADSVASC score of 4 Coumadin, Toprol In NSR today

## 2018-05-06 NOTE — Progress Notes (Signed)
Established Patient Office Visit  Subjective:  Patient ID: [name], female    DOB: 10-30-1934  Age: 84 y.o. MRN: [id number]  CC: No chief complaint on file.   HPI [name] presents for A fib, HTN, anxiety f/u  Past Medical History:  Diagnosis Date  . Anxiety state, unspecified   . Bronchitis, not specified as acute or chronic   . Diaphragmatic hernia without mention of obstruction or gangrene   . Dyslipidemia   . Essential hypertension   . Irritable bowel syndrome   . Mitral valve disorders(424.0)    a. 08/2010 Echo: EF >55%, mild MR, mod TR, trace AI.  [name] Non-obstructive CAD    a. 07/2010 Lexiscan MV: no ischemia, EF 78%; b. 07/2010 Cath: LM nl, LAD50/60p, 40 apical, LCX nl, RCA dominant, 30/40p, 64m  . Osteoarthrosis, unspecified whether generalized or localized, unspecified site   . Osteoporosis, unspecified   . Other abnormal glucose   . PAF (paroxysmal atrial fibrillation) (HCC)    a. CHA2DS2VASc = 4-->coumadin.  .Marland KitchenPhlebitis and thrombophlebitis of superficial vessels of lower extremities   . [place] ([place]ox[place] tachycardia) [place]    a. 05/2012 Holter: short bursts of PSVT noted-->Managed with toprol.  .Marland KitchenUnspecified venous (peripheral) insufficiency   . Unspecified vitamin D deficiency     Past Surgical History:  Procedure Laterality Date  . CARDIAC CATHETERIZATION  07/30/2010   nonobstructive CAD, 20-40% RCA stenosis, 50-60% eccentric LAD stenosis more prox, 40% LAD stenosis more distal, EF 65%  . TRANSTHORACIC ECHOCARDIOGRAM  09/03/2010   EF=>55%; mild MR; mod TR; AV mildly sclerotic with trace regurg; mild pulm valve regurg  . VESICOVAGINAL FISTULA CLOSURE W/ TAH      Family History  Problem Relation Age of Onset  . Stroke Mother   . Stroke Father        also heart disease  . Clotting disorder Paternal Grandmother        blood clot  . Stroke Brother        also heart disease    Social History   Socioeconomic History  .  Marital status: Widowed    Spouse name: Not on file  . Number of children: 4  . Years of education: Not on file  . Highest education level: Not on file  Occupational History  . Occupation: retired    [employer] farm manager RETIRED  Social Needs  . Financial resource strain: Not hard at all  . Food insecurity:    Worry: Never true    Inability: Never true  . Transportation needs:    Medical: No    Non-medical: No  Tobacco Use  . Smoking status: Never Smoker  . Smokeless tobacco: Never Used  Substance and Sexual Activity  . Alcohol use: No  . Drug use: No  . Sexual activity: Not Currently  Lifestyle  . Physical activity:    Days per week: 0 days    Minutes per session: 0 min  . Stress: Not at all  Relationships  . Social connections:    Talks on phone: More than three times a week    Gets together: More than three times a week    Attends religious service: More than 4 times per year    Active member of club or organization: Yes    Attends meetings of clubs or organizations: More than 4 times per year    Relationship status: Widowed  . Intimate partner violence:    Fear of current or ex partner:  Not on file    Emotionally abused: Not on file    Physically abused: Not on file    Forced sexual activity: Not on file  Other Topics Concern  . Not on file  Social History Narrative  . Not on file    Outpatient Medications Prior to Visit  Medication Sig Dispense Refill  . ACCU-CHEK FASTCLIX LANCETS MISC Use to check blood sugars twice a day Dx E11.9 102 each 2  . ALPRAZolam (XANAX) 0.5 MG tablet TAKE 1/2 TO 1 TABLET BY MOUTH THREE TIMES DAILY AS NEEDED FOR ANXIETY DO NOT TAKE WITH TRAMADOL 90 tablet 1  . amLODipine (NORVASC) 5 MG tablet TAKE 1 TABLET BY MOUTH ONCE DAILY 90 tablet 3  . aspirin 81 MG tablet Take 81 mg by mouth daily.      . Blood Glucose Monitoring Suppl (ACCU-CHEK AVIVA PLUS) w/Device KIT Use to check blood sugars twice a day Dx E11.9 1 kit 0  . Cholecalciferol (VITAMIN  D) 2000 units tablet Take 1 tablet (2,000 Units total) by mouth daily. 100 tablet 2  . clidinium-chlordiazePOXIDE (LIBRAX) 5-2.5 MG capsule TAKE ONE CAPSULE BY MOUTH THREE TIMES DAILY AS NEEDED FOR CRAMPS 60 capsule 2  . D3 SUPER STRENGTH 2000 units CAPS TAKE 1 CAPSULE BY MOUTH EVERY DAY 100 capsule 0  . fluocinonide cream (LIDEX) 0.05 % Apply twice daily to rash as needed 30 g 5  . fluticasone (FLONASE) 50 MCG/ACT nasal spray SHAKE LIQUID AND USE 2 SPRAYS IN EACH NOSTRIL DAILY 48 g 0  . furosemide (LASIX) 20 MG tablet Take 1 tablet (20 mg total) by mouth daily as needed for edema. 30 tablet 3  . glucose blood (ACCU-CHEK AVIVA PLUS) test strip Use to check blood sugars twice a day 100 each 2  . hydroxypropyl methylcellulose (ISOPTO TEARS) 2.5 % ophthalmic solution Place 1 drop into both eyes 2 (two) times daily.      . hydrOXYzine (ATARAX/VISTARIL) 25 MG tablet Take 1 tablet (25 mg total) by mouth every 8 (eight) hours as needed for itching. 60 tablet 0  . isosorbide mononitrate (IMDUR) 30 MG 24 hr tablet TAKE 1 TABLET BY MOUTH ONCE DAILY 30 tablet 11  . loperamide (IMODIUM A-D) 2 MG tablet Take 1 tablet (2 mg total) by mouth 4 (four) times daily as needed for diarrhea or loose stools. 30 tablet 0  . metoprolol succinate (TOPROL-XL) 25 MG 24 hr tablet TAKE 1 AND 1/2 TABLET TWICE A DAY 270 tablet 3  . pantoprazole (PROTONIX) 40 MG tablet take 1 tablet by mouth once daily 90 tablet 3  . simvastatin (ZOCOR) 20 MG tablet TAKE 1 TABLET BY MOUTH AT BEDTIME 90 tablet 3  . traMADol (ULTRAM) 50 MG tablet Take 1 tablet (50 mg total) by mouth 3 (three) times daily. 90 tablet 2  . triamcinolone cream (KENALOG) 0.1 % Apply 1 application topically 2 (two) times daily. 30 g 0  . warfarin (COUMADIN) 5 MG tablet TAKE 1 TABLET BY MOUTH ONCE DAILY OR AS DIRECTED BY COUMADIN CLINIC 90 tablet 1  . benzonatate (TESSALON) 200 MG capsule Take 1 capsule (200 mg total) by mouth 3 (three) times daily as needed. (Patient not  taking: Reported on 05/06/2018) 60 capsule 0   No facility-administered medications prior to visit.     Allergies  Allergen Reactions  . Clarithromycin     REACTION: unspecified  . Fosamax [Alendronate Sodium]     achy  . Guaifenesin Er     itching  .  Metformin     REACTION: pt states INTOL to Metformin "felt drained"  . Tape     Adhesive tape---rash    ROS Review of Systems  Constitutional: Negative for activity change, appetite change, chills, fatigue and unexpected weight change.  HENT: Negative for congestion, mouth sores and sinus pressure.   Eyes: Negative for visual disturbance.  Respiratory: Negative for cough and chest tightness.   Gastrointestinal: Negative for abdominal pain and nausea.  Genitourinary: Negative for difficulty urinating, frequency and vaginal pain.  Musculoskeletal: Negative for back pain and gait problem.  Skin: Negative for pallor and rash.  Neurological: Negative for dizziness, tremors, weakness, numbness and headaches.  Psychiatric/Behavioral: Negative for confusion, dysphoric mood, sleep disturbance and suicidal ideas. The patient is nervous/anxious.       Objective:    Physical Exam  Constitutional: She appears well-developed. No distress.  HENT:  Head: Normocephalic.  Right Ear: External ear normal.  Left Ear: External ear normal.  Nose: Nose normal.  Mouth/Throat: Oropharynx is clear and moist.  Eyes: Pupils are equal, round, and reactive to light. Conjunctivae are normal. Right eye exhibits no discharge. Left eye exhibits no discharge.  Neck: Normal range of motion. Neck supple. No JVD present. No tracheal deviation present. No thyromegaly present.  Cardiovascular: Normal rate, regular rhythm and normal heart sounds.  Pulmonary/Chest: No stridor. No respiratory distress. She has no wheezes.  Abdominal: Soft. Bowel sounds are normal. She exhibits no distension and no mass. There is no abdominal tenderness. There is no rebound and no  guarding.  Musculoskeletal:        General: No tenderness or edema.  Lymphadenopathy:    She has no cervical adenopathy.  Neurological: She displays normal reflexes. No cranial nerve deficit. She exhibits normal muscle tone. Coordination normal.  Skin: No rash noted. No erythema.  Psychiatric: She has a normal mood and affect. Her behavior is normal. Judgment and thought content normal.   no tremor  BP 126/80 (BP Location: Left Arm, Patient Position: Sitting, Cuff Size: Normal)   Pulse (!) 51   Temp 98.2 F (36.8 C) (Oral)   Ht _0  (1.676 m)   Wt 184 lb (83.5 kg)   SpO2 96%   BMI 29.70 kg/m  Wt Readings from Last 3 Encounters:  05/06/18 184 lb (83.5 kg)  04/29/18 188 lb (85.3 kg)  04/09/18 187 lb (84.8 kg)     Health Maintenance Due  Topic Date Due  . URINE MICROALBUMIN  10/04/1946  . TETANUS/TDAP  10/04/1955  . OPHTHALMOLOGY EXAM  04/17/2018    There are no preventive care reminders to display for this patient.  Lab Results  Component Value Date   TSH 0.74 11/02/2017   Lab Results  Component Value Date   WBC 4.2 11/02/2017   HGB 12.4 11/02/2017   HCT 37.8 11/02/2017   MCV 87.9 11/02/2017   PLT 226.0 11/02/2017   Lab Results  Component Value Date   NA 138 11/02/2017   K 4.0 11/02/2017   CO2 29 11/02/2017   GLUCOSE 149 (H) 11/02/2017   BUN 12 11/02/2017   CREATININE 1.10 11/02/2017   BILITOT 0.4 11/02/2017   ALKPHOS 63 11/02/2017   AST 14 11/02/2017   ALT 11 11/02/2017   PROT 7.3 11/02/2017   ALBUMIN 4.1 11/02/2017   CALCIUM 9.0 11/02/2017   GFR 61.29 11/02/2017   Lab Results  Component Value Date   CHOL 162 11/02/2017   Lab Results  Component Value Date   HDL 60.50  11/02/2017   Lab Results  Component Value Date   LDLCALC 88 11/02/2017   Lab Results  Component Value Date   TRIG 69.0 11/02/2017   Lab Results  Component Value Date   CHOLHDL 3 11/02/2017   Lab Results  Component Value Date   HGBA1C 7.5 (H) 11/02/2017        Assessment & Plan:   Problem List Items Addressed This Visit    None      No orders of the defined types were placed in this encounter.   Follow-up: No follow-ups on file.    Walker Kehr, MD

## 2018-05-06 NOTE — Patient Instructions (Signed)
If you have medicare related insurance (such as traditional Medicare, [employer], [employer], or similar), Please make an appointment at the scheduling desk with Sharee Pimple, the [employer], for your Wellness visit in this office, which is a benefit with your insurance. - July 2020

## 2018-05-06 NOTE — Assessment & Plan Note (Signed)
Xanax prn  Potential benefits of a long term benzodiazepines  use as well as potential risks  and complications were explained to the patient and were aknowledged. 

## 2018-05-13 ENCOUNTER — Other Ambulatory Visit: Payer: Self-pay | Admitting: Internal Medicine

## 2018-06-09 ENCOUNTER — Ambulatory Visit (INDEPENDENT_AMBULATORY_CARE_PROVIDER_SITE_OTHER): Payer: Medicare HMO | Admitting: Pharmacist Clinician (PhC)/ Clinical Pharmacy Specialist

## 2018-06-09 DIAGNOSIS — E785 Hyperlipidemia, unspecified: Secondary | ICD-10-CM | POA: Diagnosis not present

## 2018-06-09 DIAGNOSIS — Z7901 Long term (current) use of anticoagulants: Secondary | ICD-10-CM

## 2018-06-09 DIAGNOSIS — I4891 Unspecified atrial fibrillation: Secondary | ICD-10-CM

## 2018-06-09 LAB — LIPID PANEL
CHOLESTEROL TOTAL: 160 mg/dL (ref 100–199)
Chol/HDL Ratio: 2.2 ratio (ref 0.0–4.4)
HDL: 72 mg/dL (ref >39)
LDL CALC: 74 mg/dL (ref 0–99)
Triglycerides: 72 mg/dL (ref 0–149)
VLDL Cholesterol Cal: 14 mg/dL (ref 5–40)

## 2018-06-09 LAB — POCT INR: INR: 1.1 — AB (ref 2.0–3.0)

## 2018-06-09 NOTE — Patient Instructions (Signed)
Take 1.5 tablets for 3 days (Wed Feb 26, Thurs Feb 27 and Fri Feb 28), then continue with 1 tablet daily.  Repeat INR in 2 weeks

## 2018-06-11 ENCOUNTER — Other Ambulatory Visit: Payer: Self-pay | Admitting: Internal Medicine

## 2018-06-13 ENCOUNTER — Other Ambulatory Visit: Payer: Self-pay | Admitting: Internal Medicine

## 2018-06-23 ENCOUNTER — Other Ambulatory Visit: Payer: Self-pay

## 2018-06-23 ENCOUNTER — Ambulatory Visit (INDEPENDENT_AMBULATORY_CARE_PROVIDER_SITE_OTHER): Payer: Medicare HMO | Admitting: Pharmacist

## 2018-06-23 DIAGNOSIS — I4891 Unspecified atrial fibrillation: Secondary | ICD-10-CM | POA: Diagnosis not present

## 2018-06-23 DIAGNOSIS — Z7901 Long term (current) use of anticoagulants: Secondary | ICD-10-CM | POA: Diagnosis not present

## 2018-06-23 LAB — POCT INR: INR: 2.2 (ref 2.0–3.0)

## 2018-07-01 ENCOUNTER — Other Ambulatory Visit: Payer: Self-pay | Admitting: Internal Medicine

## 2018-07-05 ENCOUNTER — Other Ambulatory Visit: Payer: Self-pay | Admitting: Internal Medicine

## 2018-07-12 ENCOUNTER — Other Ambulatory Visit: Payer: Self-pay | Admitting: Internal Medicine

## 2018-07-14 ENCOUNTER — Telehealth: Payer: Self-pay | Admitting: Internal Medicine

## 2018-07-14 NOTE — Telephone Encounter (Signed)
Copied from [place] [id number]. Topic: Quick Communication - Rx Refill/Question >> Jul 14, 2018 11:46 AM Virl Axe D wrote: Medication: glucose blood (ACCU-CHEK AVIVA PLUS) test strip  Has the patient contacted their pharmacy? Yes.   (Agent: If no, request that the patient contact the pharmacy for the refill.) (Agent: If yes, when and what did the pharmacy advise?)  Preferred Pharmacy (with phone number or street name): [phone] - [place], [address] AT [facility] [address] [address] [place] [phone] (Phone) [phone] (Fax)    Agent: Please be advised that RX refills may take up to 3 business days. We ask that you follow-up with your pharmacy.

## 2018-07-15 MED ORDER — GLUCOSE BLOOD VI STRP: ORAL_STRIP | 2 refills | Status: DC

## 2018-07-15 NOTE — Telephone Encounter (Signed)
RX sent

## 2018-08-26 ENCOUNTER — Other Ambulatory Visit: Payer: Self-pay | Admitting: Internal Medicine

## 2018-08-26 NOTE — Telephone Encounter (Signed)
Please advise about refill in Dr. Plotnikovs absence. 

## 2018-09-03 ENCOUNTER — Telehealth: Payer: Self-pay

## 2018-09-03 NOTE — Telephone Encounter (Signed)
lmom to switch the location of the appt to [facility] and prescreen

## 2018-09-07 ENCOUNTER — Ambulatory Visit (INDEPENDENT_AMBULATORY_CARE_PROVIDER_SITE_OTHER): Payer: Medicare HMO | Admitting: *Deleted

## 2018-09-07 ENCOUNTER — Other Ambulatory Visit: Payer: Self-pay

## 2018-09-07 DIAGNOSIS — I4891 Unspecified atrial fibrillation: Secondary | ICD-10-CM | POA: Diagnosis not present

## 2018-09-07 DIAGNOSIS — Z7901 Long term (current) use of anticoagulants: Secondary | ICD-10-CM | POA: Diagnosis not present

## 2018-09-07 LAB — POCT INR: INR: 2.3 (ref 2.0–3.0)

## 2018-09-07 NOTE — Patient Instructions (Signed)
Description   Spoke with pt and instructed pt continue with 1 tablet daily.  Repeat INR in 6 weeks

## 2018-09-07 NOTE — Telephone Encounter (Signed)

## 2018-09-10 DIAGNOSIS — D4101 Neoplasm of uncertain behavior of right kidney: Secondary | ICD-10-CM | POA: Diagnosis not present

## 2018-09-10 DIAGNOSIS — R3121 Asymptomatic microscopic hematuria: Secondary | ICD-10-CM | POA: Diagnosis not present

## 2018-09-10 DIAGNOSIS — N2889 Other specified disorders of kidney and ureter: Secondary | ICD-10-CM | POA: Diagnosis not present

## 2018-09-12 ENCOUNTER — Encounter (HOSPITAL_COMMUNITY): Payer: Self-pay | Admitting: Emergency Medicine

## 2018-09-12 ENCOUNTER — Other Ambulatory Visit: Payer: Self-pay

## 2018-09-12 ENCOUNTER — Emergency Department (HOSPITAL_COMMUNITY)
Admission: EM | Admit: 2018-09-12 | Discharge: 2018-09-12 | Disposition: A | Discharge status: Home / Self Care | Payer: Medicare HMO | Attending: Emergency Medicine | Admitting: Emergency Medicine

## 2018-09-12 DIAGNOSIS — Z79899 Other long term (current) drug therapy: Secondary | ICD-10-CM | POA: Diagnosis not present

## 2018-09-12 DIAGNOSIS — I10 Essential (primary) hypertension: Secondary | ICD-10-CM | POA: Insufficient documentation

## 2018-09-12 DIAGNOSIS — E119 Type 2 diabetes mellitus without complications: Secondary | ICD-10-CM | POA: Insufficient documentation

## 2018-09-12 DIAGNOSIS — R002 Palpitations: Secondary | ICD-10-CM | POA: Diagnosis not present

## 2018-09-12 DIAGNOSIS — Z7982 Long term (current) use of aspirin: Secondary | ICD-10-CM | POA: Insufficient documentation

## 2018-09-12 DIAGNOSIS — Z7901 Long term (current) use of anticoagulants: Secondary | ICD-10-CM | POA: Insufficient documentation

## 2018-09-12 LAB — CBC WITH DIFFERENTIAL/PLATELET
Abs Immature Granulocytes: 0.03 10*3/uL (ref 0.00–0.07)
Basophils Absolute: 0 10*3/uL (ref 0.0–0.1)
Basophils Relative: 0 %
Eosinophils Absolute: 0 10*3/uL (ref 0.0–0.5)
Eosinophils Relative: 0 %
HCT: 40.9 % (ref 36.0–46.0)
Hemoglobin: 13 g/dL (ref 12.0–15.0)
Immature Granulocytes: 0 %
Lymphocytes Relative: 15 %
Lymphs Abs: 1.3 10*3/uL (ref 0.7–4.0)
MCH: 28.8 pg (ref 26.0–34.0)
MCHC: 31.8 g/dL (ref 30.0–36.0)
MCV: 90.7 fL (ref 80.0–100.0)
Monocytes Absolute: 0.5 10*3/uL (ref 0.1–1.0)
Monocytes Relative: 6 %
Neutro Abs: 6.6 10*3/uL (ref 1.7–7.7)
Neutrophils Relative %: 79 %
Platelets: 219 10*3/uL (ref 150–400)
RBC: 4.51 MIL/uL (ref 3.87–5.11)
RDW: 13.1 % (ref 11.5–15.5)
WBC: 8.5 10*3/uL (ref 4.0–10.5)
nRBC: 0 % (ref 0.0–0.2)

## 2018-09-12 LAB — BASIC METABOLIC PANEL
Anion gap: 11 (ref 5–15)
BUN: 14 mg/dL (ref 8–23)
CO2: 25 mmol/L (ref 22–32)
Calcium: 9 mg/dL (ref 8.9–10.3)
Chloride: 97 mmol/L — ABNORMAL LOW (ref 98–111)
Creatinine, Ser: 1.1 mg/dL — ABNORMAL HIGH (ref 0.44–1.00)
GFR calc Af Amer: 55 mL/min — ABNORMAL LOW (ref >60)
GFR calc non Af Amer: 47 mL/min — ABNORMAL LOW (ref >60)
Glucose, Bld: 176 mg/dL — ABNORMAL HIGH (ref 70–99)
Potassium: 3.5 mmol/L (ref 3.5–5.1)
Sodium: 133 mmol/L — ABNORMAL LOW (ref 135–145)

## 2018-09-12 LAB — MAGNESIUM: Magnesium: 2 mg/dL (ref 1.7–2.4)

## 2018-09-12 LAB — PROTIME-INR
INR: 2.2 — ABNORMAL HIGH (ref 0.8–1.2)
Prothrombin Time: 24.2 seconds — ABNORMAL HIGH (ref 11.4–15.2)

## 2018-09-12 MED ORDER — METOPROLOL TARTRATE 5 MG/5ML IV SOLN
5.0000 mg | Freq: Once | INTRAVENOUS | Status: AC
  Administered 2018-09-12: 5 mg via INTRAVENOUS
  Filled 2018-09-12: qty 5

## 2018-09-12 NOTE — ED Provider Notes (Signed)
[facility] DEPT Provider Note   CSN: [id number] Arrival date & time: 09/12/18  0105    History   Chief Complaint Chief Complaint  Patient presents with  . irregular heart rate    HPI [name] is a 84 y.o. female.     The history is provided by the patient.  Palpitations  Palpitations quality:  Irregular Onset quality:  Sudden Duration:  9 hours Timing:  Intermittent Progression:  Unchanged Chronicity:  New Relieved by:  None tried Worsened by:  Nothing Associated symptoms: no chest pain, no cough, no shortness of breath, no syncope and no vomiting   Patient presents for palpitations. Reported started over 9 hours ago.  It has been intermittent She has a history of paroxysmal A. fib, PSVT.  She is on anticoagulation.  She reports taking all of her medicines.  She has no other acute complaints Past Medical History:  Diagnosis Date  . Anxiety state, unspecified   . Bronchitis, not specified as acute or chronic   . Diaphragmatic hernia without mention of obstruction or gangrene   . Dyslipidemia   . Essential hypertension   . Irritable bowel syndrome   . Mitral valve disorders(424.0)    a. 08/2010 Echo: EF >55%, mild MR, mod TR, trace AI.  [name] Non-obstructive CAD    a. 07/2010 Lexiscan MV: no ischemia, EF 78%; b. 07/2010 Cath: LM nl, LAD50/60p, 40 apical, LCX nl, RCA dominant, 30/40p, 53m  . Osteoarthrosis, unspecified whether generalized or localized, unspecified site   . Osteoporosis, unspecified   . Other abnormal glucose   . PAF (paroxysmal atrial fibrillation) (HCC)    a. CHA2DS2VASc = 4-->coumadin.  .Marland KitchenPhlebitis and thrombophlebitis of superficial vessels of lower extremities   . PSVT (paroxysmal supraventricular tachycardia) ([facility]    a. 05/2012 Holter: short bursts of PSVT noted-->Managed with toprol.  .Marland KitchenUnspecified venous (peripheral) insufficiency   . Unspecified vitamin D deficiency     Patient Active Problem List   Diagnosis Date Noted  . Rash 12/05/2016  . Dyspnea 04/24/2016  . Tricuspid valve insufficiency 04/24/2016  . Cerumen impaction 11/19/2015  . Ear discomfort 11/13/2015  . Grief at loss of child 09/14/2015  . Well adult exam 09/14/2015  . Dyslipidemia   . PSVT (paroxysmal supraventricular tachycardia) ([facility]   . PAF (par[place]mal atrial fibrillation) ([facility]   . Viral gastroenteritis 07/21/2015  . Acute upper respiratory infection 04/17/2015  . Edema 11/08/2014  . Chronic venous insufficiency 11/08/2014  . Diarrhea 05/01/2014  . Mass of left thigh 12/14/2013  . Microhematuria 12/14/2013  . Long term (current) use of anticoagulants 08/19/2013  . CAD (coronary artery disease) 05/21/2011  . Atrial tachycardia ([place] 11/19/2010  . CHEST PAIN 08/07/2009  . Vitamin D deficiency 04/19/2008  . SUPERFICIAL THROMBOPHLEBITIS 10/20/2007  . DM2 (diabetes mellitus, type 2) ([place] 03/16/2007  . HYPERCHOLESTEROLEMIA 03/15/2007  . Anxiety 03/15/2007  . Essential hypertension 03/15/2007  . Mitral valve disorder 03/15/2007  . BRONCHITIS 03/15/2007  . HIATAL HERNIA 03/15/2007  . Irritable bowel syndrome 03/15/2007  . DEGENERATIVE JOINT DISEASE 03/15/2007  . Osteoporosis 03/15/2007    Past Surgical History:  Procedure Laterality Date  . CARDIAC CATHETERIZATION  07/30/2010   nonobstructive CAD, 20-40% RCA stenosis, 50-60% eccentric LAD stenosis more prox, 40% LAD stenosis more distal, EF 65%  . TRANSTHORACIC ECHOCARDIOGRAM  09/03/2010   EF=>55%; mild MR; mod TR; AV mildly sclerotic with trace regurg; mild pulm valve regurg  . VESICOVAGINAL FISTULA CLOSURE W/ TAH  OB History   No obstetric history on file.      Home Medications    Prior to Admission medications   Medication Sig Start Date End Date Taking? Authorizing Provider  ACCU-CHEK FASTCLIX LANCETS MISC Use to check blood sugars twice a day Dx E11.9 07/18/15  Yes Plotnikov, Evie Lacks, MD  ALPRAZolam (XANAX) 0.5 MG tablet TAKE 1/2 TO 1  TABLET BY MOUTH THREE TIMES DAILY AS NEEDED FOR ANXIETY DO NOT TAKE WITH TRAMADOL. This is a 30 day supply Patient taking differently: Take 0.25-0.5 mg by mouth 3 (three) times daily as needed for anxiety.  08/26/18  Yes Hoyt Koch, MD  amLODipine (NORVASC) 5 MG tablet TAKE 1 TABLET BY MOUTH EVERY DAY Patient taking differently: Take 5 mg by mouth daily.  07/02/18  Yes Plotnikov, Evie Lacks, MD  aspirin 81 MG tablet Take 81 mg by mouth daily.     Yes [provider]  Blood Glucose Monitoring Suppl (ACCU-CHEK AVIVA PLUS) w/Device KIT Use to check blood sugars twice a day Dx E11.9 04/02/16  Yes Plotnikov, Evie Lacks, MD  Cholecalciferol (VITAMIN D) 2000 units tablet Take 1 tablet (2,000 Units total) by mouth daily. 01/11/18  Yes Plotnikov, Evie Lacks, MD  furosemide (LASIX) 20 MG tablet Take 1 tablet (20 mg total) by mouth daily as needed for edema. 10/29/17  Yes Plotnikov, Evie Lacks, MD  glucose blood (ACCU-CHEK AVIVA PLUS) test strip Use to check blood sugars twice a day 07/15/18  Yes Plotnikov, Evie Lacks, MD  hydroxypropyl methylcellulose (ISOPTO TEARS) 2.5 % ophthalmic solution Place 1 drop into both eyes 2 (two) times daily.     Yes [provider]  isosorbide mononitrate (IMDUR) 30 MG 24 hr tablet TAKE 1 TABLET BY MOUTH EVERY DAY Patient taking differently: Take 30 mg by mouth daily.  06/15/18  Yes Hilty, Nadean Corwin, MD  loperamide (IMODIUM A-D) 2 MG tablet Take 1 tablet (2 mg total) by mouth 4 (four) times daily as needed for diarrhea or loose stools. 05/01/14  Yes Plotnikov, Evie Lacks, MD  metoprolol succinate (TOPROL-XL) 25 MG 24 hr tablet TAKE 1 AND 1/2 TABLET TWICE A DAY Patient taking differently: Take 37.5 mg by mouth 2 (two) times a day.  05/04/18  Yes Hilty, Nadean Corwin, MD  pantoprazole (PROTONIX) 40 MG tablet TAKE 1 TABLET BY MOUTH ONCE DAILY Patient taking differently: Take 40 mg by mouth daily.  06/11/18  Yes Hilty, Nadean Corwin, MD  simvastatin (ZOCOR) 20 MG tablet TAKE 1  TABLET BY MOUTH AT BEDTIME Patient taking differently: Take 20 mg by mouth at bedtime.  01/27/18  Yes Plotnikov, Evie Lacks, MD  warfarin (COUMADIN) 5 MG tablet TAKE 1 TABLET BY MOUTH ONCE DAILY OR AS DIRECTED BY COUMADIN CLINIC Patient taking differently: Take 5 mg by mouth every evening.  04/05/18  Yes Hilty, Nadean Corwin, MD  clidinium-chlordiazePOXIDE (LIBRAX) 5-2.5 MG capsule TAKE ONE CAPSULE BY MOUTH THREE TIMES DAILY AS NEEDED FOR CRAMPS Patient not taking: Reported on 09/12/2018 04/05/18   Plotnikov, Evie Lacks, MD  D3 SUPER STRENGTH 2000 units CAPS TAKE 1 CAPSULE BY MOUTH EVERY DAY Patient not taking: Reported on 09/12/2018 10/13/17   Plotnikov, Evie Lacks, MD  fluocinonide cream (LIDEX) 0.05 % Apply twice daily to rash as needed Patient not taking: Reported on 09/12/2018 07/05/12   Noralee Space, MD  fluticasone (FLONASE) 50 MCG/ACT nasal spray SHAKE LIQUID AND USE 2 SPRAYS IN [facility] NOSTRIL DAILY Patient not taking: Reported on 09/12/2018 07/06/18  Plotnikov, Evie Lacks, MD  hydrOXYzine (ATARAX/VISTARIL) 25 MG tablet Take 1 tablet (25 mg total) by mouth every 8 (eight) hours as needed for itching. Patient not taking: Reported on 09/12/2018 10/11/14   Plotnikov, Evie Lacks, MD  traMADol (ULTRAM) 50 MG tablet TAKE 1 TABLET BY MOUTH THREE TIMES DAILY Patient not taking: Reported on 09/12/2018 07/15/18   Plotnikov, Evie Lacks, MD  triamcinolone cream (KENALOG) 0.1 % Apply 1 application topically 2 (two) times daily. Patient not taking: Reported on 09/12/2018 12/05/16   Rosemarie Ax, MD    Family History Family History  Problem Relation Age of Onset  . Stroke Mother   . Stroke Father        also heart disease  . Clotting disorder Paternal Grandmother        blood clot  . Stroke Brother        also heart disease    Social History Social History   Tobacco Use  . Smoking status: Never Smoker  . Smokeless tobacco: Never Used  Substance Use Topics  . Alcohol use: No  . Drug use: No      Allergies   Clarithromycin; Fosamax [alendronate sodium]; Guaifenesin er; Metformin; and Tape   Review of Systems Review of Systems  Constitutional: Negative for fever.  Respiratory: Negative for cough and shortness of breath.   Cardiovascular: Positive for palpitations. Negative for chest pain and syncope.  Gastrointestinal: Negative for vomiting.  Neurological: Negative for syncope.  All other systems reviewed and are negative.    Physical Exam Updated Vital Signs BP (!) 185/109 (BP Location: Right Arm)   Pulse 100   Temp 97.9 F (36.6 C) (Oral)   Resp 18   Ht 1.651 m (_0 )   Wt 81.6 kg   SpO2 98%   BMI 29.95 kg/m   Physical Exam CONSTITUTIONAL: Elderly, no acute distress HEAD: Normocephalic/atraumatic EYES: EOMI ENMT: mask in place NECK: supple no meningeal signs SPINE/BACK:entire spine nontender CV: Intermittently tachycardic, no loud murmurs LUNGS: Lungs are clear to auscultation bilaterally, no apparent distress ABDOMEN: soft, nontender, no rebound or guarding, bowel sounds noted throughout abdomen GU:no cva tenderness NEURO: Pt is awake/alert/appropriate, moves all extremitiesx4.  No facial droop.   EXTREMITIES: pulses normal/equal, full ROM SKIN: warm, color normal PSYCH: no abnormalities of mood noted, alert and oriented to situation   ED Treatments / Results  Labs (all labs ordered are listed, but only abnormal results are displayed) Labs Reviewed  BASIC METABOLIC PANEL - Abnormal; Notable for the following components:      Result Value   Sodium 133 (*)    Chloride 97 (*)    Glucose, Bld 176 (*)    Creatinine, Ser 1.10 (*)    GFR calc non Af Amer 47 (*)    GFR calc Af Amer 55 (*)    All other components within normal limits  PROTIME-INR - Abnormal; Notable for the following components:   Prothrombin Time 24.2 (*)    INR 2.2 (*)    All other components within normal limits  CBC WITH DIFFERENTIAL/PLATELET  MAGNESIUM    EKG EKG  Interpretation  Date/Time:  Sunday Sep 12 2018 01:16:15 EDT Ventricular Rate:  112 PR Interval:    QRS Duration: 101 QT Interval:  337 QTC Calculation: 460 R Axis:   -52 Text Interpretation:  Sinus tachycardia with irregular rate LAD, consider left anterior fascicular block Abnormal R-wave progression, early transition Left ventricular hypertrophy Nonspecific T abnormalities, lateral leads Baseline wander in lead(s)  V2 V5 Confirmed by Ripley Fraise [phone]) on 09/12/2018 1:51:02 AM   EKG Interpretation  Date/Time:  Sunday Sep 12 2018 04:38:32 EDT Ventricular Rate:  80 PR Interval:    QRS Duration: 104 QT Interval:  402 QTC Calculation: 464 R Axis:   -49 Text Interpretation:  Sinus rhythm LAD, consider left anterior fascicular block Abnormal R-wave progression, early transition Left ventricular hypertrophy HR is improved on this EKG Confirmed by Ripley Fraise [id number]) on 09/12/2018 4:49:56 AM        Radiology No results found.  Procedures Procedures  Medications Ordered in ED Medications  metoprolol tartrate (LOPRESSOR) injection 5 mg (5 mg Intravenous Given 09/12/18 0332)     Initial Impression / Assessment and Plan / ED Course  I have reviewed the triage vital signs and the nursing notes.  Pertinent labs results that were available during my care of the patient were reviewed by me and considered in my medical decision making (see chart for details).        3:24 AM Patient with known history of paroxysmal A. fib as well as PSVT.  Mali Vasc score is 4 While I am in the room she is in sinus rhythm with intermittent runs of atrial fibrillation.  Heart rate is typically around 100. Labs reassuring.  We will plan to give her an additional dose of Lopressor and reassess.  She is on metoprolol at home 5:23 AM Patient had great improvement to IV metoprolol.  Heart rate is now steady in the 60s and 70s.  Blood pressure is improved Suspect she was having intermittent runs  of A. fib versus SVT. She does continue her medications at home.  Follow-up with cardiology in next 2 weeks.  Discussed the case with patient and her granddaughter. Final Clinical Impressions(s) / ED Diagnoses   Final diagnoses:  Palpitations    ED Discharge Orders    None       Ripley Fraise, MD 09/12/18 [phone]

## 2018-09-12 NOTE — ED Triage Notes (Signed)
Pt reports having a fluttering in her heart since 3pm yesterday. Pt reports hx of HTN but denies any chest pain. Pt states she feels nervous.

## 2018-09-13 DIAGNOSIS — D4102 Neoplasm of uncertain behavior of left kidney: Secondary | ICD-10-CM | POA: Diagnosis not present

## 2018-09-13 DIAGNOSIS — N281 Cyst of kidney, acquired: Secondary | ICD-10-CM | POA: Diagnosis not present

## 2018-09-13 DIAGNOSIS — R3121 Asymptomatic microscopic hematuria: Secondary | ICD-10-CM | POA: Diagnosis not present

## 2018-09-21 ENCOUNTER — Encounter: Payer: Self-pay | Admitting: Internal Medicine

## 2018-09-21 ENCOUNTER — Ambulatory Visit (INDEPENDENT_AMBULATORY_CARE_PROVIDER_SITE_OTHER): Payer: Medicare HMO | Admitting: Internal Medicine

## 2018-09-21 ENCOUNTER — Other Ambulatory Visit: Payer: Self-pay

## 2018-09-21 DIAGNOSIS — E559 Vitamin D deficiency, unspecified: Secondary | ICD-10-CM

## 2018-09-21 DIAGNOSIS — F419 Anxiety disorder, unspecified: Secondary | ICD-10-CM | POA: Diagnosis not present

## 2018-09-21 DIAGNOSIS — I2583 Coronary atherosclerosis due to lipid rich plaque: Secondary | ICD-10-CM

## 2018-09-21 DIAGNOSIS — E119 Type 2 diabetes mellitus without complications: Secondary | ICD-10-CM | POA: Diagnosis not present

## 2018-09-21 DIAGNOSIS — I480 Paroxysmal atrial fibrillation: Secondary | ICD-10-CM | POA: Diagnosis not present

## 2018-09-21 DIAGNOSIS — I2510 Atherosclerotic heart disease of native coronary artery without angina pectoris: Secondary | ICD-10-CM | POA: Diagnosis not present

## 2018-09-21 MED ORDER — METOPROLOL SUCCINATE ER 25 MG PO TB24: 50.0000 mg | ORAL_TABLET | Freq: Two times a day (BID) | ORAL | 3 refills | Status: DC

## 2018-09-21 MED ORDER — TRAMADOL HCL 50 MG PO TABS: 50.0000 mg | ORAL_TABLET | Freq: Three times a day (TID) | ORAL | 3 refills | Status: DC

## 2018-09-21 MED ORDER — ALPRAZOLAM 0.5 MG PO TABS: ORAL_TABLET | ORAL | 5 refills | Status: DC

## 2018-09-21 NOTE — Assessment & Plan Note (Signed)
HR 80s --  I increased Toprol to 50 mg bid Coumadin

## 2018-09-21 NOTE — Assessment & Plan Note (Signed)
Vit D 

## 2018-09-21 NOTE — Assessment & Plan Note (Signed)
Simvastatin, Toprol, Isosorbide, Amlodipine

## 2018-09-21 NOTE — Assessment & Plan Note (Signed)
  On diet  

## 2018-09-21 NOTE — Progress Notes (Signed)
Subjective:  Patient ID: [name], female    DOB: 1935-03-31  Age: 84 y.o. MRN: [id number]  CC: No chief complaint on file.   HPI GARYN WAGUESPACK presents for PAF on 09/12/18. F/u anticoagulation. C/o cramps at times.   HR 80s  Outpatient Medications Prior to Visit  Medication Sig Dispense Refill  . ACCU-CHEK FASTCLIX LANCETS MISC Use to check blood sugars twice a day Dx E11.9 102 each 2  . ALPRAZolam (XANAX) 0.5 MG tablet TAKE 1/2 TO 1 TABLET BY MOUTH THREE TIMES DAILY AS NEEDED FOR ANXIETY DO NOT TAKE WITH TRAMADOL. This is a 30 day supply (Patient taking differently: Take 0.25-0.5 mg by mouth 3 (three) times daily as needed for anxiety. ) 45 tablet 0  . amLODipine (NORVASC) 5 MG tablet TAKE 1 TABLET BY MOUTH EVERY DAY (Patient taking differently: Take 5 mg by mouth daily. ) 90 tablet 3  . aspirin 81 MG tablet Take 81 mg by mouth daily.      . Blood Glucose Monitoring Suppl (ACCU-CHEK AVIVA PLUS) w/Device KIT Use to check blood sugars twice a day Dx E11.9 1 kit 0  . Cholecalciferol (VITAMIN D) 2000 units tablet Take 1 tablet (2,000 Units total) by mouth daily. 100 tablet 2  . clidinium-chlordiazePOXIDE (LIBRAX) 5-2.5 MG capsule TAKE ONE CAPSULE BY MOUTH THREE TIMES DAILY AS NEEDED FOR CRAMPS (Patient not taking: Reported on 09/12/2018) 60 capsule 2  . D3 SUPER STRENGTH 2000 units CAPS TAKE 1 CAPSULE BY MOUTH EVERY DAY (Patient not taking: Reported on 09/12/2018) 100 capsule 0  . fluocinonide cream (LIDEX) 0.05 % Apply twice daily to rash as needed (Patient not taking: Reported on 09/12/2018) 30 g 5  . fluticasone (FLONASE) 50 MCG/ACT nasal spray SHAKE LIQUID AND USE 2 SPRAYS IN EACH NOSTRIL DAILY (Patient not taking: Reported on 09/12/2018) 48 g 0  . furosemide (LASIX) 20 MG tablet Take 1 tablet (20 mg total) by mouth daily as needed for edema. 30 tablet 3  . glucose blood (ACCU-CHEK AVIVA PLUS) test strip Use to check blood sugars twice a day 100 each 2  . hydroxypropyl  methylcellulose (ISOPTO TEARS) 2.5 % ophthalmic solution Place 1 drop into both eyes 2 (two) times daily.      . hydrOXYzine (ATARAX/VISTARIL) 25 MG tablet Take 1 tablet (25 mg total) by mouth every 8 (eight) hours as needed for itching. (Patient not taking: Reported on 09/12/2018) 60 tablet 0  . isosorbide mononitrate (IMDUR) 30 MG 24 hr tablet TAKE 1 TABLET BY MOUTH EVERY DAY (Patient taking differently: Take 30 mg by mouth daily. ) 30 tablet 11  . loperamide (IMODIUM A-D) 2 MG tablet Take 1 tablet (2 mg total) by mouth 4 (four) times daily as needed for diarrhea or loose stools. 30 tablet 0  . metoprolol succinate (TOPROL-XL) 25 MG 24 hr tablet TAKE 1 AND 1/2 TABLET TWICE A DAY (Patient taking differently: Take 37.5 mg by mouth 2 (two) times a day. ) 270 tablet 3  . pantoprazole (PROTONIX) 40 MG tablet TAKE 1 TABLET BY MOUTH ONCE DAILY (Patient taking differently: Take 40 mg by mouth daily. ) 90 tablet 3  . simvastatin (ZOCOR) 20 MG tablet TAKE 1 TABLET BY MOUTH AT BEDTIME (Patient taking differently: Take 20 mg by mouth at bedtime. ) 90 tablet 3  . traMADol (ULTRAM) 50 MG tablet TAKE 1 TABLET BY MOUTH THREE TIMES DAILY (Patient not taking: Reported on 09/12/2018) 90 tablet 0  . triamcinolone cream (KENALOG) 0.1 %  Apply 1 application topically 2 (two) times daily. (Patient not taking: Reported on 09/12/2018) 30 g 0  . warfarin (COUMADIN) 5 MG tablet TAKE 1 TABLET BY MOUTH ONCE DAILY OR AS DIRECTED BY COUMADIN CLINIC (Patient taking differently: Take 5 mg by mouth every evening. ) 90 tablet 1   No facility-administered medications prior to visit.     ROS: Review of Systems  Constitutional: Negative for activity change, appetite change, chills, fatigue and unexpected weight change.  HENT: Negative for congestion, mouth sores and sinus pressure.   Eyes: Negative for visual disturbance.  Respiratory: Negative for cough and chest tightness.   Cardiovascular: Positive for palpitations.   Gastrointestinal: Negative for abdominal pain and nausea.  Genitourinary: Negative for difficulty urinating, frequency and vaginal pain.  Musculoskeletal: Negative for back pain and gait problem.  Skin: Negative for pallor and rash.  Neurological: Negative for dizziness, tremors, weakness, numbness and headaches.  Psychiatric/Behavioral: Negative for confusion and sleep disturbance.    Objective:  There were no vitals taken for this visit.  BP Readings from Last 3 Encounters:  09/12/18 128/82  05/06/18 126/80  04/29/18 128/72    Wt Readings from Last 3 Encounters:  09/12/18 180 lb (81.6 kg)  05/06/18 184 lb (83.5 kg)  04/29/18 188 lb (85.3 kg)    Physical Exam Constitutional:      General: She is not in acute distress.    Appearance: She is well-developed.  HENT:     Head: Normocephalic.     Right Ear: External ear normal.     Left Ear: External ear normal.     Nose: Nose normal.  Eyes:     General:        Right eye: No discharge.        Left eye: No discharge.     Conjunctiva/sclera: Conjunctivae normal.     Pupils: Pupils are equal, round, and reactive to light.  Neck:     Musculoskeletal: Normal range of motion and neck supple.     Thyroid: No thyromegaly.     Vascular: No JVD.     Trachea: No tracheal deviation.  Cardiovascular:     Rate and Rhythm: Tachycardia present. Rhythm irregular.     Heart sounds: Normal heart sounds.  Pulmonary:     Effort: No respiratory distress.     Breath sounds: No stridor. No wheezing.  Abdominal:     General: Bowel sounds are normal. There is no distension.     Palpations: Abdomen is soft. There is no mass.     Tenderness: There is no abdominal tenderness. There is no guarding or rebound.  Musculoskeletal:        General: No tenderness.  Lymphadenopathy:     Cervical: No cervical adenopathy.  Skin:    Findings: No erythema or rash.  Neurological:     Cranial Nerves: No cranial nerve deficit.     Motor: No abnormal  muscle tone.     Coordination: Coordination normal.     Deep Tendon Reflexes: Reflexes normal.  Psychiatric:        Behavior: Behavior normal.        Thought Content: Thought content normal.        Judgment: Judgment normal.     Lab Results  Component Value Date   WBC 8.5 09/12/2018   HGB 13.0 09/12/2018   HCT 40.9 09/12/2018   PLT 219 09/12/2018   GLUCOSE 176 (H) 09/12/2018   CHOL 160 06/09/2018   TRIG 72 06/09/2018  HDL 72 06/09/2018   LDLDIRECT 105.5 11/26/2010   LDLCALC 74 06/09/2018   ALT 11 11/02/2017   AST 14 11/02/2017   NA 133 (L) 09/12/2018   K 3.5 09/12/2018   CL 97 (L) 09/12/2018   CREATININE 1.10 (H) 09/12/2018   BUN 14 09/12/2018   CO2 25 09/12/2018   TSH 1.55 05/06/2018   INR 2.2 (H) 09/12/2018   HGBA1C 7.2 (H) 05/06/2018    No results found.  Assessment & Plan:   There are no diagnoses linked to this encounter.   No orders of the defined types were placed in this encounter.    Follow-up: No follow-ups on file.  Walker Kehr, MD

## 2018-09-21 NOTE — Assessment & Plan Note (Signed)
Xanax prn  Potential benefits of a long term benzodiazepines  use as well as potential risks  and complications were explained to the patient and were aknowledged. Not to take w/Tramadol

## 2018-09-21 NOTE — Patient Instructions (Signed)
Tylenol PM for cramps

## 2018-09-23 ENCOUNTER — Telehealth: Payer: Self-pay | Admitting: Adult Health

## 2018-09-23 NOTE — Telephone Encounter (Signed)
call home phone/ declined my chart/ consent/ pre reg completed

## 2018-09-27 NOTE — Progress Notes (Signed)
Virtual Visit via Telephone Note   This visit type was conducted due to national recommendations for restrictions regarding the COVID-19 Pandemic (e.g. social distancing) in an effort to limit this patient's exposure and mitigate transmission in our community.  Due to her co-morbid illnesses, this patient is at least at moderate risk for complications without adequate follow up.  This format is felt to be most appropriate for this patient at this time.  The patient did not have access to video technology/had technical difficulties with video requiring transitioning to audio format only (telephone).  All issues noted in this document were discussed and addressed.  No physical exam could be performed with this format.  Please refer to the patient's chart for her  consent to telehealth for [facility].   Date:  09/28/2018   ID:  [name], DOB 1934/08/04, MRN [id number]  Patient Location: Home Provider Location: Home  PCP:  Cassandria Anger, MD  Cardiologist:  Pixie Casino, MD  Electrophysiologist:  None   Evaluation Performed:  Follow-Up Visit  Chief Complaint:  Rapid HR, ED follow up  History of Present Illness:    [name] is a 84 y.o. female with known history of paroxysmal atrial fibrillation and SVT in the past.  She is on Coumadin therapy.  She is followed by our Coumadin clinic.  Most recent cardiac catheterization 2012 revealed nonobstructive coronary artery disease, she was noted to have moderate tricuspid regurgitation and some LVH.  She was last seen by Dr. Debara Pickett in January 2020 and was stable from a cardiac standpoint.  No testing or medication changes were made.  She was to see Dr. Debara Pickett annually.  She was seen in the ED on 09/12/2018 for complaints of racing HR and was found to have Afib RVR. She was treated with IV lopressor with good response to this dropping HR from 114 bpm to 60's and 70's  She was seen by her primary care physician Dr. Alain Marion on  09/21/2018.  At that time heart rate was in the 80s and therefore he wanted to increase Toprol to 50 mg twice daily.  We are seeing her on follow-up to evaluate her response to medications.She was reluctant to take increased dose of metoprolol until she was seen by cardiology   She states that she has not had any more episodes of rapid HR which led her to go to ER, but has noticed that HR does go up transiently and calms down on its own, or when she drinks cold water. She denies associated chest pain or dyspnea. This occurs twice or three times a week, lasting less than a minute.   She is compliant with her medications to include metoprolol 37.5 mg BID. BP ha been stable She has her coumadin dosed by the [facility] office pharmacists.   The patient does not have symptoms concerning for COVID-19 infection (fever, chills, cough, or new shortness of breath).    Past Medical History:  Diagnosis Date  . Anxiety state, unspecified   . Bronchitis, not specified as acute or chronic   . Diaphragmatic hernia without mention of obstruction or gangrene   . Dyslipidemia   . Essential hypertension   . Irritable bowel syndrome   . Mitral valve disorders(424.0)    a. 08/2010 Echo: EF >55%, mild MR, mod TR, trace AI.  [name] Non-obstructive CAD    a. 07/2010 Lexiscan MV: no ischemia, EF 78%; b. 07/2010 Cath: LM nl, LAD50/60p, 40 apical, LCX nl, RCA dominant,  30/40p, 54m  . Osteoarthrosis, unspecified whether generalized or localized, unspecified site   . Osteoporosis, unspecified   . Other abnormal glucose   . PAF (paroxysmal atrial fibrillation) (HCC)    a. CHA2DS2VASc = 4-->coumadin.  .Marland KitchenPhlebitis and thrombophlebitis of superficial vessels of lower extremities   . [place]cardia) [place]    a. 05/2012 Holter: short bursts of PSVT noted-->Managed with toprol.  .Marland KitchenUnspecified venous (peripheral) insufficiency   . Unspecified vitamin D deficiency    Past Surgical History:  Procedure  Laterality Date  . CARDIAC CATHETERIZATION  07/30/2010   nonobstructive CAD, 20-40% RCA stenosis, 50-60% eccentric LAD stenosis more prox, 40% LAD stenosis more distal, EF 65%  . TRANSTHORACIC ECHOCARDIOGRAM  09/03/2010   EF=>55%; mild MR; mod TR; AV mildly sclerotic with trace regurg; mild pulm valve regurg  . VESICOVAGINAL FISTULA CLOSURE W/ TAH       Current Meds  Medication Sig  . ACCU-CHEK FASTCLIX LANCETS MISC Use to check blood sugars twice a day Dx E11.9  . ALPRAZolam (XANAX) 0.5 MG tablet TAKE 1/2 TO 1 TABLET BY MOUTH THREE TIMES DAILY AS NEEDED FOR ANXIETY DO NOT TAKE WITH TRAMADOL. This is a 30 day supply  . amLODipine (NORVASC) 5 MG tablet TAKE 1 TABLET BY MOUTH EVERY DAY (Patient taking differently: Take 5 mg by mouth daily. )  . aspirin 81 MG tablet Take 81 mg by mouth daily.    . Blood Glucose Monitoring Suppl (ACCU-CHEK AVIVA PLUS) w/Device KIT Use to check blood sugars twice a day Dx E11.9  . Cholecalciferol (VITAMIN D) 2000 units tablet Take 1 tablet (2,000 Units total) by mouth daily.  . clidinium-chlordiazePOXIDE (LIBRAX) 5-2.5 MG capsule TAKE ONE CAPSULE BY MOUTH THREE TIMES DAILY AS NEEDED FOR CRAMPS  . fluocinonide cream (LIDEX) 0.05 % Apply twice daily to rash as needed  . fluticasone (FLONASE) 50 MCG/ACT nasal spray SHAKE LIQUID AND USE 2 SPRAYS IN EACH NOSTRIL DAILY  . furosemide (LASIX) 20 MG tablet Take 1 tablet (20 mg total) by mouth daily as needed for edema.  .Marland Kitchenglucose blood (ACCU-CHEK AVIVA PLUS) test strip Use to check blood sugars twice a day  . hydroxypropyl methylcellulose (ISOPTO TEARS) 2.5 % ophthalmic solution Place 1 drop into both eyes 2 (two) times daily.    . hydrOXYzine (ATARAX/VISTARIL) 25 MG tablet Take 1 tablet (25 mg total) by mouth every 8 (eight) hours as needed for itching.  . isosorbide mononitrate (IMDUR) 30 MG 24 hr tablet TAKE 1 TABLET BY MOUTH EVERY DAY (Patient taking differently: Take 30 mg by mouth daily. )  . loperamide (IMODIUM A-D)  2 MG tablet Take 1 tablet (2 mg total) by mouth 4 (four) times daily as needed for diarrhea or loose stools.  . metoprolol succinate (TOPROL-XL) 25 MG 24 hr tablet Take 2 tablets (50 mg total) by mouth 2 (two) times a day.  . pantoprazole (PROTONIX) 40 MG tablet TAKE 1 TABLET BY MOUTH ONCE DAILY (Patient taking differently: Take 40 mg by mouth daily. )  . simvastatin (ZOCOR) 20 MG tablet TAKE 1 TABLET BY MOUTH AT BEDTIME (Patient taking differently: Take 20 mg by mouth at bedtime. )  . traMADol (ULTRAM) 50 MG tablet Take 1 tablet (50 mg total) by mouth 3 (three) times daily.  .Marland Kitchentriamcinolone cream (KENALOG) 0.1 % Apply 1 application topically 2 (two) times daily.  .Marland Kitchenwarfarin (COUMADIN) 5 MG tablet TAKE 1 TABLET BY MOUTH ONCE DAILY OR AS DIRECTED BY COUMADIN CLINIC (Patient  taking differently: Take 5 mg by mouth every evening. )  . [DISCONTINUED] D3 SUPER STRENGTH 2000 units CAPS TAKE 1 CAPSULE BY MOUTH EVERY DAY     Allergies:   Clarithromycin, Fosamax [alendronate sodium], Guaifenesin er, Metformin, and Tape   Social History   Tobacco Use  . Smoking status: Never Smoker  . Smokeless tobacco: Never Used  Substance Use Topics  . Alcohol use: No  . Drug use: No     Family Hx: The patient's family history includes Clotting disorder in her paternal grandmother; Stroke in her brother, father, and mother.  ROS:   Please see the history of present illness.    All other systems reviewed and are negative.   Prior CV studies:   The following studies were reviewed today:  Echocardiogram 05/19/2018 Left ventricle: The cavity size was normal. There was mild focal   basal hypertrophy of the septum. Systolic function was normal.   The estimated ejection fraction was in the range of 60% to 65%.   Wall motion was normal; there were no regional wall motion   abnormalities. Doppler parameters are consistent with abnormal   left ventricular relaxation (grade 1 diastolic dysfunction). - Pulmonary  arteries: Systolic pressure was mildly increased. PA   peak pressure: 34 mm Hg (S).  Labs/Other Tests and Data Reviewed:    EKG:  No ECG reviewed.  Recent Labs: 11/02/2017: ALT 11 05/06/2018: TSH 1.55 09/12/2018: BUN 14; Creatinine, Ser 1.10; Hemoglobin 13.0; Magnesium 2.0; Platelets 219; Potassium 3.5; Sodium 133   Recent Lipid Panel Lab Results  Component Value Date/Time   CHOL 160 06/09/2018 10:46 AM   TRIG 72 06/09/2018 10:46 AM   HDL 72 06/09/2018 10:46 AM   CHOLHDL 2.2 06/09/2018 10:46 AM   CHOLHDL 3 11/02/2017 10:09 AM   LDLCALC 74 06/09/2018 10:46 AM   LDLDIRECT 105.5 11/26/2010 09:05 AM    Wt Readings from Last 3 Encounters:  09/28/18 184 lb (83.5 kg)  09/21/18 184 lb (83.5 kg)  09/12/18 180 lb (81.6 kg)     Objective:    Vital Signs:  BP 132/88   Pulse 63   Ht '5\' 5"'$  (1.651 m)   Wt 184 lb (83.5 kg)   BMI 30.62 kg/m  Exam limited by telephone visit   VITAL SIGNS:  reviewed GEN:  no acute distress NEURO:  alert and oriented x 3, no obvious focal deficit PSYCH:  normal affect  ASSESSMENT & PLAN:    1.Atrial fib:  Episode of elevated HR requiring treatment in the ED with IV metoprolol. She has had occasional elevations in HR since ER visit but very transient and brief. I will have her wear a 2 week cardiac monitor to ascertain her HR variability and for evidence of sustained SVT or Afib RVR.   If she experiences elevated HR lasting longer than 15 minutes, she is to take additional 12.5 mg of metoprolol at that time (1 dose only) in addition to usual dose of metoprolol 37.5 mg BID.  She will see Dr. Debara Pickett on follow up for review of cardiac monitor and for recommendations of medication adjustments or changes. Reluctant to increase dose of BB at this time to avoid hypotension, especially in light of coumadin dosing and age,to prevent falls.  2. Hypertension: Continue amlodipine, isosorbide. If need to increase metoprolol, may need to reduce doses of these medications.    3. Hypercholestrolemia: Continue statin therapy. Last LDL was 74.     COVID-19 Education: The signs and symptoms of COVID-19  were discussed with the patient and how to seek care for testing (follow up with PCP or arrange E-visit).  The importance of social distancing was discussed today.  Time:   Today, I have spent 20 minutes with the patient with telehealth technology discussing the above problems.     Medication Adjustments/Labs and Tests Ordered: Current medicines are reviewed at length with the patient today.  Concerns regarding medicines are outlined above.   Tests Ordered: Orders Placed This Encounter  Procedures  . Cardiac event monitor    Medication Changes: No orders of the defined types were placed in this encounter.   Disposition:  Follow up 3-4 weeks with Dr. Debara Pickett   Signed, Phill Myron. [place] Pugh, ANP, AACC  09/28/2018 11:52 AM    [facility]

## 2018-09-28 ENCOUNTER — Telehealth (INDEPENDENT_AMBULATORY_CARE_PROVIDER_SITE_OTHER): Payer: Medicare HMO | Admitting: Adult Health

## 2018-09-28 VITALS — BP 132/88 | HR 63 | Ht 65.0 in | Wt 184.0 lb

## 2018-09-28 DIAGNOSIS — I471 Supraventricular tachycardia: Secondary | ICD-10-CM

## 2018-09-28 DIAGNOSIS — I480 Paroxysmal atrial fibrillation: Secondary | ICD-10-CM

## 2018-09-28 DIAGNOSIS — I10 Essential (primary) hypertension: Secondary | ICD-10-CM

## 2018-09-28 DIAGNOSIS — E7800 Pure hypercholesterolemia, unspecified: Secondary | ICD-10-CM

## 2018-09-28 NOTE — Patient Instructions (Signed)
Medication Instructions:  METOPROLOL 37.5MG  TWICE DAILY-MAY TAKE ADDITIONAL 1/2 TAB CALL TO INFORM  KATHRYN lAWRENCE, DNP If you need a refill on your cardiac medications before your next appointment, please call your pharmacy.  Testing/Procedures: Your physician has recommended that you wear an event monitor-14 day. Event monitors are medical devices that record the heart's electrical activity. Doctors most often [place] these monitors to diagnose arrhythmias. Arrhythmias are problems with the speed or rhythm of the heartbeat. The monitor is a small, portable device. You can wear one while you do your normal daily activities. This is usually used to diagnose what is causing palpitations/syncope (passing out).  THEY MAY MAIL MONITOR, OR This will be performed at our [facility] location - [address], [address].  Follow-Up: You will need a follow up appointment in [place], [place] or one of the following Advanced Practice Providers on your designated Care Team:  Almyra Deforest, [place] Fabian Sharp, PA-C      At [facility], you and your health needs are our priority.  As part of our continuing mission to provide you with exceptional heart care, we have created designated Provider Care Teams.  These Care Teams include your primary Cardiologist (physician) and Advanced Practice Providers (APPs -  Physician Assistants and Nurse Practitioners) who all work together to provide you with the care you need, when you need it.  Thank you for choosing [facility] at [facility]!!

## 2018-09-29 ENCOUNTER — Other Ambulatory Visit: Payer: Self-pay | Admitting: Internal Medicine

## 2018-09-29 ENCOUNTER — Telehealth: Payer: Self-pay | Admitting: *Deleted

## 2018-09-29 DIAGNOSIS — I480 Paroxysmal atrial fibrillation: Secondary | ICD-10-CM

## 2018-09-29 NOTE — Telephone Encounter (Signed)
[employer] to ship 14 day cardiac event monitor to patients home.  Instructions reviewed briefly as they are included in the monitor kit.  Patient encouraged to have family member assist in applying monitor and changing strip, monitor battery.  Patient instructed to call office if she is having difficulty with monitor.

## 2018-10-10 ENCOUNTER — Ambulatory Visit (INDEPENDENT_AMBULATORY_CARE_PROVIDER_SITE_OTHER): Payer: Medicare HMO

## 2018-10-10 DIAGNOSIS — I471 Supraventricular tachycardia: Secondary | ICD-10-CM | POA: Diagnosis not present

## 2018-10-10 DIAGNOSIS — I480 Paroxysmal atrial fibrillation: Secondary | ICD-10-CM

## 2018-10-11 ENCOUNTER — Telehealth: Payer: Self-pay

## 2018-10-11 NOTE — Telephone Encounter (Signed)
Contacted pt regarding critical [employer] results from 6/28 at 10:31pm. Pt states she was putting monitor on around the time of those results, but asymptomatic. She states she is currently asymptomatic, denying CP, SOB, palpitations, lightheadedness, dizziness. Pt questioned if she will receive a call daily. Informed pt that current call is for wellness check d/t monitor results and she may receive multiple calls in the future regarding monitor. Informed pt that will call back pt if Dr. Debara Pickett has any recommendations for her.   Spoke with Dr. Debara Pickett who reviewed results and stated that results okay d/t "Report Analysis: Sinus Rhythm w/ PACs" on document.

## 2018-10-12 ENCOUNTER — Telehealth: Payer: Self-pay

## 2018-10-12 NOTE — Telephone Encounter (Signed)
lmom for prescreen  

## 2018-10-19 ENCOUNTER — Other Ambulatory Visit: Payer: Self-pay

## 2018-10-19 ENCOUNTER — Ambulatory Visit (INDEPENDENT_AMBULATORY_CARE_PROVIDER_SITE_OTHER): Payer: Medicare HMO | Admitting: Pharmacist

## 2018-10-19 DIAGNOSIS — Z7901 Long term (current) use of anticoagulants: Secondary | ICD-10-CM

## 2018-10-19 DIAGNOSIS — I480 Paroxysmal atrial fibrillation: Secondary | ICD-10-CM | POA: Diagnosis not present

## 2018-10-19 LAB — POCT INR: INR: 2.3 (ref 2.0–3.0)

## 2018-10-28 DIAGNOSIS — H1131 Conjunctival hemorrhage, right eye: Secondary | ICD-10-CM | POA: Diagnosis not present

## 2018-11-01 ENCOUNTER — Other Ambulatory Visit: Payer: Self-pay

## 2018-11-03 ENCOUNTER — Telehealth: Payer: Self-pay | Admitting: *Deleted

## 2018-11-03 MED ORDER — METOPROLOL SUCCINATE ER 50 MG PO TB24: 50.0000 mg | ORAL_TABLET | Freq: Two times a day (BID) | ORAL | 3 refills | Status: DC

## 2018-11-03 NOTE — Telephone Encounter (Signed)
Spoke with pt about her Metoprolol medication, due to her abnormal monitor which showed fast HR, pt is advised to increase her Metoprolol to 50 mg BID, pt is current taking 1 1/2 tablet BID, pt made aware of her increase in her medication Rx has been sent to the pharmacy electronically. Metoprolol 50 mg twice a day #180 3 refills. Pt voice understanding and thanks.

## 2018-11-03 NOTE — Telephone Encounter (Signed)
-----   Message from Lendon Colonel, NP sent at 11/03/2018  1:09 PM EDT ----- Regarding: Increase meds [name] will need to increase her metoprolol to 50 mg BID please due to abnormal monitor which showed fast HR.   KL

## 2018-11-09 ENCOUNTER — Encounter: Payer: Self-pay | Admitting: Internal Medicine

## 2018-11-09 ENCOUNTER — Other Ambulatory Visit: Payer: Self-pay

## 2018-11-09 ENCOUNTER — Ambulatory Visit (INDEPENDENT_AMBULATORY_CARE_PROVIDER_SITE_OTHER): Payer: Medicare HMO | Admitting: Internal Medicine

## 2018-11-09 VITALS — BP 140/80 | HR 65 | Temp 98.1°F | Ht 65.0 in | Wt 182.0 lb

## 2018-11-09 DIAGNOSIS — I10 Essential (primary) hypertension: Secondary | ICD-10-CM

## 2018-11-09 DIAGNOSIS — Z0000 Encounter for general adult medical examination without abnormal findings: Secondary | ICD-10-CM | POA: Diagnosis not present

## 2018-11-09 DIAGNOSIS — I480 Paroxysmal atrial fibrillation: Secondary | ICD-10-CM

## 2018-11-09 DIAGNOSIS — I2583 Coronary atherosclerosis due to lipid rich plaque: Secondary | ICD-10-CM

## 2018-11-09 DIAGNOSIS — I2510 Atherosclerotic heart disease of native coronary artery without angina pectoris: Secondary | ICD-10-CM | POA: Diagnosis not present

## 2018-11-09 DIAGNOSIS — E119 Type 2 diabetes mellitus without complications: Secondary | ICD-10-CM | POA: Diagnosis not present

## 2018-11-09 DIAGNOSIS — E785 Hyperlipidemia, unspecified: Secondary | ICD-10-CM

## 2018-11-09 MED ORDER — TRIAMCINOLONE ACETONIDE 0.1 % EX CREA: 1.0000 "application " | TOPICAL_CREAM | Freq: Four times a day (QID) | CUTANEOUS | 1 refills | Status: DC

## 2018-11-09 NOTE — Assessment & Plan Note (Signed)
  On diet  

## 2018-11-09 NOTE — Assessment & Plan Note (Signed)
Metoprolol, Norvasc, Lasix 

## 2018-11-09 NOTE — Assessment & Plan Note (Signed)
On Coumadin, Toprol XL

## 2018-11-09 NOTE — Assessment & Plan Note (Signed)
.   Pt refused all shots

## 2018-11-09 NOTE — Assessment & Plan Note (Signed)
Simvastatin, Toprol, Isosorbide, Amlodipine

## 2018-11-09 NOTE — Patient Instructions (Signed)
If you have medicare related insurance (such as traditional Medicare, Blue Cross Medicare, United HealthCare Medicare, or similar), Please make an appointment at the scheduling desk with Jill, the Wellness Health Coach, for your Wellness visit in this office, which is a benefit with your insurance.  

## 2018-11-09 NOTE — Progress Notes (Signed)
Subjective:  Patient ID: [name], female    DOB: [birth year]  Age: 84 y.o. MRN: [id number]  CC: No chief complaint on file.   HPI GAILE ALLMON presents for HTN, PAF, DM. Pt refused all shots BP ok at home Outpatient Medications Prior to Visit  Medication Sig Dispense Refill  . ACCU-CHEK FASTCLIX LANCETS MISC Use to check blood sugars twice a day Dx E11.9 102 each 2  . ALPRAZolam (XANAX) 0.5 MG tablet TAKE 1/2 TO 1 TABLET BY MOUTH THREE TIMES DAILY AS NEEDED FOR ANXIETY DO NOT TAKE WITH TRAMADOL. This is a 30 day supply 90 tablet 5  . amLODipine (NORVASC) 5 MG tablet TAKE 1 TABLET BY MOUTH EVERY DAY (Patient taking differently: Take 5 mg by mouth daily. ) 90 tablet 3  . aspirin 81 MG tablet Take 81 mg by mouth daily.      . Blood Glucose Monitoring Suppl (ACCU-CHEK AVIVA PLUS) w/Device KIT Use to check blood sugars twice a day Dx E11.9 1 kit 0  . Cholecalciferol (VITAMIN D) 2000 units tablet Take 1 tablet (2,000 Units total) by mouth daily. 100 tablet 2  . clidinium-chlordiazePOXIDE (LIBRAX) 5-2.5 MG capsule TAKE ONE CAPSULE BY MOUTH THREE TIMES DAILY AS NEEDED FOR CRAMPS 60 capsule 2  . fluocinonide cream (LIDEX) 0.05 % Apply twice daily to rash as needed 30 g 5  . fluticasone (FLONASE) 50 MCG/ACT nasal spray SHAKE LIQUID AND USE 2 SPRAYS IN EACH NOSTRIL DAILY 48 g 0  . furosemide (LASIX) 20 MG tablet Take 1 tablet (20 mg total) by mouth daily as needed for edema. 30 tablet 3  . glucose blood (ACCU-CHEK AVIVA PLUS) test strip Use to check blood sugars twice a day 100 each 2  . hydroxypropyl methylcellulose (ISOPTO TEARS) 2.5 % ophthalmic solution Place 1 drop into both eyes 2 (two) times daily.      . hydrOXYzine (ATARAX/VISTARIL) 25 MG tablet Take 1 tablet (25 mg total) by mouth every 8 (eight) hours as needed for itching. 60 tablet 0  . isosorbide mononitrate (IMDUR) 30 MG 24 hr tablet TAKE 1 TABLET BY MOUTH EVERY DAY (Patient taking differently: Take 30 mg by mouth daily. )  30 tablet 11  . loperamide (IMODIUM A-D) 2 MG tablet Take 1 tablet (2 mg total) by mouth 4 (four) times daily as needed for diarrhea or loose stools. 30 tablet 0  . pantoprazole (PROTONIX) 40 MG tablet TAKE 1 TABLET BY MOUTH ONCE DAILY (Patient taking differently: Take 40 mg by mouth daily. ) 90 tablet 3  . simvastatin (ZOCOR) 20 MG tablet TAKE 1 TABLET BY MOUTH AT BEDTIME (Patient taking differently: Take 20 mg by mouth at bedtime. ) 90 tablet 3  . traMADol (ULTRAM) 50 MG tablet Take 1 tablet (50 mg total) by mouth 3 (three) times daily. 90 tablet 3  . triamcinolone cream (KENALOG) 0.1 % Apply 1 application topically 2 (two) times daily. 30 g 0  . warfarin (COUMADIN) 5 MG tablet TAKE 1 TABLET BY MOUTH EVERY DAY AS DIRECTED BY COUMADIN CLINIC 90 tablet 0  . metoprolol succinate (TOPROL-XL) 50 MG 24 hr tablet Take 1 tablet (50 mg total) by mouth 2 (two) times a day. (Patient not taking: Reported on 11/09/2018) 180 tablet 3   No facility-administered medications prior to visit.     ROS: Review of Systems  Constitutional: Negative for activity change, appetite change, chills, fatigue and unexpected weight change.  HENT: Negative for congestion, mouth sores and sinus pressure.  Eyes: Negative for visual disturbance.  Respiratory: Negative for cough and chest tightness.   Cardiovascular: Positive for palpitations.  Gastrointestinal: Negative for abdominal pain and nausea.  Genitourinary: Negative for difficulty urinating, frequency and vaginal pain.  Musculoskeletal: Negative for back pain and gait problem.  Skin: Negative for pallor and rash.  Neurological: Negative for dizziness, tremors, weakness, numbness and headaches.  Psychiatric/Behavioral: Negative for confusion, sleep disturbance and suicidal ideas.    Objective:  BP 140/80   Pulse 65   Temp 98.1 F (36.7 C) (Oral)   Ht _0  (1.651 m)   Wt 182 lb (82.6 kg)   SpO2 97%   BMI 30.29 kg/m   BP Readings from Last 3 Encounters:   11/09/18 140/80  09/28/18 132/88  09/21/18 134/78    Wt Readings from Last 3 Encounters:  11/09/18 182 lb (82.6 kg)  09/28/18 184 lb (83.5 kg)  09/21/18 184 lb (83.5 kg)    Physical Exam Constitutional:      General: She is not in acute distress.    Appearance: She is well-developed.  HENT:     Head: Normocephalic.     Right Ear: External ear normal.     Left Ear: External ear normal.     Nose: Nose normal.  Eyes:     General:        Right eye: No discharge.        Left eye: No discharge.     Conjunctiva/sclera: Conjunctivae normal.     Pupils: Pupils are equal, round, and reactive to light.  Neck:     Musculoskeletal: Normal range of motion and neck supple.     Thyroid: No thyromegaly.     Vascular: No JVD.     Trachea: No tracheal deviation.  Cardiovascular:     Rate and Rhythm: Normal rate and regular rhythm.     Heart sounds: Normal heart sounds.  Pulmonary:     Effort: No respiratory distress.     Breath sounds: No stridor. No wheezing.  Abdominal:     General: Bowel sounds are normal. There is no distension.     Palpations: Abdomen is soft. There is no mass.     Tenderness: There is no abdominal tenderness. There is no guarding or rebound.  Musculoskeletal:        General: No tenderness.  Lymphadenopathy:     Cervical: No cervical adenopathy.  Skin:    Findings: No erythema or rash.  Neurological:     Mental Status: She is oriented to person, place, and time.     Cranial Nerves: No cranial nerve deficit.     Motor: No abnormal muscle tone.     Coordination: Coordination normal.     Deep Tendon Reflexes: Reflexes normal.  Psychiatric:        Behavior: Behavior normal.        Thought Content: Thought content normal.        Judgment: Judgment normal.     Lab Results  Component Value Date   WBC 8.5 09/12/2018   HGB 13.0 09/12/2018   HCT 40.9 09/12/2018   PLT 219 09/12/2018   GLUCOSE 176 (H) 09/12/2018   CHOL 160 06/09/2018   TRIG 72 06/09/2018    HDL 72 06/09/2018   LDLDIRECT 105.5 11/26/2010   LDLCALC 74 06/09/2018   ALT 11 11/02/2017   AST 14 11/02/2017   NA 133 (L) 09/12/2018   K 3.5 09/12/2018   CL 97 (L) 09/12/2018   CREATININE 1.10 (H)  09/12/2018   BUN 14 09/12/2018   CO2 25 09/12/2018   TSH 1.55 05/06/2018   INR 2.3 10/19/2018   HGBA1C 7.2 (H) 05/06/2018    No results found.  Assessment & Plan:   Diagnoses and all orders for this visit:  Dyslipidemia -     TSH; Future -     Lipid panel; Future  Essential hypertension  Coronary artery disease due to lipid rich plaque  PAF (paroxysmal atrial fibrillation) (HCC) -     Basic metabolic panel; Future -     Hemoglobin A1c; Future -     TSH; Future -     Lipid panel; Future -     Hepatic function panel; Future  Type 2 diabetes mellitus without complication, without long-term current use of insulin (HCC) -     Basic metabolic panel; Future -     Hemoglobin A1c; Future -     TSH; Future -     Lipid panel; Future -     Hepatic function panel; Future  Well adult exam     No orders of the defined types were placed in this encounter.    Follow-up: Return in about 4 months (around 03/12/2019) for Wellness Exam.  Walker Kehr, MD

## 2018-11-17 ENCOUNTER — Telehealth: Payer: Self-pay | Admitting: Adult Health

## 2018-11-17 DIAGNOSIS — I10 Essential (primary) hypertension: Secondary | ICD-10-CM

## 2018-11-17 DIAGNOSIS — R002 Palpitations: Secondary | ICD-10-CM

## 2018-11-17 NOTE — Telephone Encounter (Signed)
She may increase metoprolol to 75 mg in the am, (1.5 tablets)  but keep her evening dose to 50 mg.  Check a Vitamin D level please-dx palpitations and hypertension.

## 2018-11-17 NOTE — Telephone Encounter (Signed)
New Message   Pt c/o medication issue:  1. Name of Medication: metoprolol succinate (TOPROL-XL) 50 MG 24 hr    2. How are you currently taking this medication (dosage and times per day)?   3. Are you having a reaction (difficulty breathing--STAT)?   4. What is your medication issue? Patient is stating that this medication is causing her to have heart flutters. Please call to discuss.

## 2018-11-17 NOTE — Telephone Encounter (Signed)
Patient notified of NP recommendations. Med list updated. Lab ordered

## 2018-11-17 NOTE — Telephone Encounter (Signed)
Attempted to return call to patient. Phone rang with no answer

## 2018-11-17 NOTE — Telephone Encounter (Signed)
Spoke with patient who reports an increase in her heart fluttering since her metoprolol succinate dose increase a few weeks ago. She has PAF, explained this. Also explained that she was previously on metoprolol succinate, dose increase was made based on monitor results. She has no home BP/HR readings but recent from PCP is in Epic. Advised will routed to NP to review and advise

## 2018-11-17 NOTE — Telephone Encounter (Signed)
Follow up ° ° °Patient is returning your call. Please call. ° ° ° °

## 2018-11-18 ENCOUNTER — Other Ambulatory Visit (INDEPENDENT_AMBULATORY_CARE_PROVIDER_SITE_OTHER): Payer: Medicare HMO

## 2018-11-18 DIAGNOSIS — R002 Palpitations: Secondary | ICD-10-CM | POA: Diagnosis not present

## 2018-11-18 DIAGNOSIS — I480 Paroxysmal atrial fibrillation: Secondary | ICD-10-CM | POA: Diagnosis not present

## 2018-11-18 DIAGNOSIS — E785 Hyperlipidemia, unspecified: Secondary | ICD-10-CM

## 2018-11-18 DIAGNOSIS — I10 Essential (primary) hypertension: Secondary | ICD-10-CM | POA: Diagnosis not present

## 2018-11-18 DIAGNOSIS — E119 Type 2 diabetes mellitus without complications: Secondary | ICD-10-CM

## 2018-11-18 LAB — HEPATIC FUNCTION PANEL
ALT: 12 U/L (ref 0–35)
AST: 15 U/L (ref 0–37)
Albumin: 4.4 g/dL (ref 3.5–5.2)
Alkaline Phosphatase: 59 U/L (ref 39–117)
Bilirubin, Direct: 0.1 mg/dL (ref 0.0–0.3)
Total Bilirubin: 0.4 mg/dL (ref 0.2–1.2)
Total Protein: 7.4 g/dL (ref 6.0–8.3)

## 2018-11-18 LAB — LIPID PANEL
Cholesterol: 176 mg/dL (ref 0–200)
HDL: 72.2 mg/dL (ref >39.00)
LDL Cholesterol: 89 mg/dL (ref 0–99)
NonHDL: 103.64
Total CHOL/HDL Ratio: 2
Triglycerides: 75 mg/dL (ref 0.0–149.0)
VLDL: 15 mg/dL (ref 0.0–40.0)

## 2018-11-18 LAB — BASIC METABOLIC PANEL
BUN: 17 mg/dL (ref 6–23)
CO2: 29 mEq/L (ref 19–32)
Calcium: 9.3 mg/dL (ref 8.4–10.5)
Chloride: 104 mEq/L (ref 96–112)
Creatinine, Ser: 1.08 mg/dL (ref 0.40–1.20)
GFR: 58.75 mL/min — ABNORMAL LOW (ref >60.00)
Glucose, Bld: 134 mg/dL — ABNORMAL HIGH (ref 70–99)
Potassium: 4.2 mEq/L (ref 3.5–5.1)
Sodium: 139 mEq/L (ref 135–145)

## 2018-11-18 LAB — HEMOGLOBIN A1C: Hgb A1c MFr Bld: 7.2 % — ABNORMAL HIGH (ref 4.6–6.5)

## 2018-11-18 LAB — TSH: TSH: 1.39 u[IU]/mL (ref 0.35–4.50)

## 2018-11-19 LAB — VITAMIN D 25 HYDROXY (VIT D DEFICIENCY, FRACTURES): Vit D, 25-Hydroxy: 38.3 ng/mL (ref 30.0–100.0)

## 2018-11-24 NOTE — Progress Notes (Addendum)
Subjective:   [name] is a 84 y.o. female who presents for Medicare Annual (Subsequent) preventive examination. I connected with patient by a telephone and verified that I am speaking with the correct person using two identifiers. Patient stated full name and DOB. Patient gave permission to continue with telephonic visit. Patient's location was at home and Nurse's location was at [place] office.   Review of Systems:   Cardiac Risk Factors include: advanced age (>18mn, >>38women);diabetes mellitus;dyslipidemia;hypertension Sleep patterns: gets up 1 times nightly to void and sleeps 7 hours nightly.    Home Safety/Smoke Alarms: Feels safe in home. Smoke alarms in place.  Living environment; residence and Firearm Safety: 1-story house/ trailer. Lives with son, no needs for DME, good support system Seat Belt Safety/Bike Helmet: Wears seat belt.     Objective:     Vitals: There were no vitals taken for this visit.  There is no height or weight on file to calculate BMI.  Advanced Directives 11/25/2018 09/12/2018 10/29/2017  Does Patient Have a Medical Advance Directive? No No No  Would patient like information on creating a medical advance directive? Yes (ED - Information included in AVS) - Yes (ED - Information included in AVS)    Tobacco Social History   Tobacco Use  Smoking Status Never Smoker  Smokeless Tobacco Never Used     Counseling given: Not Answered  Past Medical History:  Diagnosis Date  . Anxiety state, unspecified   . Bronchitis, not specified as acute or chronic   . Diaphragmatic hernia without mention of obstruction or gangrene   . Dyslipidemia   . Essential hypertension   . Irritable bowel syndrome   . Mitral valve disorders(424.0)    a. 08/2010 Echo: EF >55%, mild MR, mod TR, trace AI.  .Marland KitchenNon-obstructive CAD    a. 07/2010 Lexiscan MV: no ischemia, EF 78%; b. 07/2010 Cath: LM nl, LAD50/60p, 40 apical, LCX nl, RCA dominant, 30/40p, 225m . Osteoarthrosis,  unspecified whether generalized or localized, unspecified site   . Osteoporosis, unspecified   . Other abnormal glucose   . PAF (paroxysmal atrial fibrillation) (HCC)    a. CHA2DS2VASc = 4-->coumadin.  . Marland Kitchenhlebitis and thrombophlebitis of superficial vessels of lower extremities   . PSVT (paroxysmal supraventricular tachycardia) (HCCairo   a. 05/2012 Holter: short bursts of PSVT noted-->Managed with toprol.  . Marland Kitchennspecified venous (peripheral) insufficiency   . Unspecified vitamin D deficiency    Past Surgical History:  Procedure Laterality Date  . CARDIAC CATHETERIZATION  07/30/2010   nonobstructive CAD, 20-40% RCA stenosis, 50-60% eccentric LAD stenosis more prox, 40% LAD stenosis more distal, EF 65%  . TRANSTHORACIC ECHOCARDIOGRAM  09/03/2010   EF=>55%; mild MR; mod TR; AV mildly sclerotic with trace regurg; mild pulm valve regurg  . VESICOVAGINAL FISTULA CLOSURE W/ TAH     Family History  Problem Relation Age of Onset  . Stroke Mother   . Stroke Father        also heart disease  . Clotting disorder Paternal Grandmother        blood clot  . Stroke Brother        also heart disease   Social History   Socioeconomic History  . Marital status: Widowed    Spouse name: Not on file  . Number of children: 4  . Years of education: Not on file  . Highest education level: Not on file  Occupational History  . Occupation: retired    EmFish farm managerRETIRED  Social Needs  . Financial resource strain: Not hard at all  . Food insecurity    Worry: Never true    Inability: Never true  . Transportation needs    Medical: No    Non-medical: No  Tobacco Use  . Smoking status: Never Smoker  . Smokeless tobacco: Never Used  Substance and Sexual Activity  . Alcohol use: No  . Drug use: No  . Sexual activity: Not Currently  Lifestyle  . Physical activity    Days per week: 0 days    Minutes per session: 0 min  . Stress: Not at all  Relationships  . Social connections    Talks on phone: More  than three times a week    Gets together: More than three times a week    Attends religious service: More than 4 times per year    Active member of club or organization: Yes    Attends meetings of clubs or organizations: More than 4 times per year    Relationship status: Widowed  Other Topics Concern  . Not on file  Social History Narrative  . Not on file    Outpatient Encounter Medications as of 11/25/2018  Medication Sig  . ACCU-CHEK FASTCLIX LANCETS MISC Use to check blood sugars twice a day Dx E11.9  . ALPRAZolam (XANAX) 0.5 MG tablet TAKE 1/2 TO 1 TABLET BY MOUTH THREE TIMES DAILY AS NEEDED FOR ANXIETY DO NOT TAKE WITH TRAMADOL. This is a 30 day supply  . amLODipine (NORVASC) 5 MG tablet TAKE 1 TABLET BY MOUTH EVERY DAY (Patient taking differently: Take 5 mg by mouth daily. )  . aspirin 81 MG tablet Take 81 mg by mouth daily.    . Blood Glucose Monitoring Suppl (ACCU-CHEK AVIVA PLUS) w/Device KIT Use to check blood sugars twice a day Dx E11.9  . Cholecalciferol (VITAMIN D) 2000 units tablet Take 1 tablet (2,000 Units total) by mouth daily.  . clidinium-chlordiazePOXIDE (LIBRAX) 5-2.5 MG capsule TAKE ONE CAPSULE BY MOUTH THREE TIMES DAILY AS NEEDED FOR CRAMPS  . fluocinonide cream (LIDEX) 0.05 % Apply twice daily to rash as needed  . fluticasone (FLONASE) 50 MCG/ACT nasal spray SHAKE LIQUID AND USE 2 SPRAYS IN EACH NOSTRIL DAILY  . furosemide (LASIX) 20 MG tablet Take 1 tablet (20 mg total) by mouth daily as needed for edema.  [name] glucose blood (ACCU-CHEK AVIVA PLUS) test strip Use to check blood sugars twice a day  . hydroxypropyl methylcellulose (ISOPTO TEARS) 2.5 % ophthalmic solution Place 1 drop into both eyes 2 (two) times daily.    . hydrOXYzine (ATARAX/VISTARIL) 25 MG tablet Take 1 tablet (25 mg total) by mouth every 8 (eight) hours as needed for itching.  . isosorbide mononitrate (IMDUR) 30 MG 24 hr tablet TAKE 1 TABLET BY MOUTH EVERY DAY (Patient taking differently: Take 30 mg  by mouth daily. )  . loperamide (IMODIUM A-D) 2 MG tablet Take 1 tablet (2 mg total) by mouth 4 (four) times daily as needed for diarrhea or loose stools.  . metoprolol succinate (TOPROL-XL) 50 MG 24 hr tablet Take '75mg'$  by mouth in the morning and '50mg'$  in the evening. Take with or immediately following a meal.  . pantoprazole (PROTONIX) 40 MG tablet TAKE 1 TABLET BY MOUTH ONCE DAILY (Patient taking differently: Take 40 mg by mouth daily. )  . simvastatin (ZOCOR) 20 MG tablet TAKE 1 TABLET BY MOUTH AT BEDTIME (Patient taking differently: Take 20 mg by mouth at bedtime. )  .  traMADol (ULTRAM) 50 MG tablet Take 1 tablet (50 mg total) by mouth 3 (three) times daily.  [name] triamcinolone cream (KENALOG) 0.1 % Apply 1 application topically 2 (two) times daily.  [name] triamcinolone cream (KENALOG) 0.1 % Apply 1 application topically 4 (four) times daily.  [name] warfarin (COUMADIN) 5 MG tablet TAKE 1 TABLET BY MOUTH EVERY DAY AS DIRECTED BY COUMADIN CLINIC   No facility-administered encounter medications on file as of 11/25/2018.     Activities of Daily Living In your present state of health, do you have any difficulty performing the following activities: 11/25/2018  Hearing? N  Vision? N  Difficulty concentrating or making decisions? N  Walking or climbing stairs? N  Dressing or bathing? N  Doing errands, shopping? N  Preparing Food and eating ? N  Using the Toilet? N  In the past six months, have you accidently leaked urine? N  Do you have problems with loss of bowel control? N  Managing your Medications? Y  Managing your Finances? Y  Housekeeping or managing your Housekeeping? Y  Some recent data might be hidden    Patient Care Team: Plotnikov, Evie Lacks, MD as PCP - General (Internal Medicine) Debara Pickett Nadean Corwin, MD as PCP - Cardiology (Cardiology) Warden Fillers, MD as Consulting Physician (Ophthalmology)    Assessment:   This is a routine wellness examination for [facility]. Physical assessment  deferred to PCP.   Exercise Activities and Dietary recommendations Current Exercise Habits: Home exercise routine, Type of exercise: walking(chair exercise), Time (Minutes): 25, Frequency (Times/Week): 3, Weekly Exercise (Minutes/Week): 75, Intensity: Mild, Exercise limited by: orthopedic condition(s)  Diet (meal preparation, eat out, water intake, caffeinated beverages, dairy products, fruits and vegetables): in general, a "healthy" diet  , well balanced   Reviewed heart healthy and diabetic diet. Encouraged patient to increase daily water and healthy fluid intake.  Goals    . Patient Stated     Continue to stay active in my church doing volunteer work and bible studies. Enjoy life and family.       Fall Risk Fall Risk  11/25/2018 11/09/2018 10/29/2017 04/27/2017 03/18/2016  Falls in the past year? 0 0 No No No  Number falls in past yr: 0 0 - - -  Injury with Fall? - 0 - - -  Risk for fall due to : Impaired balance/gait;Impaired mobility - - - -  Follow up Falls prevention discussed - - - -    Depression Screen PHQ 2/9 Scores 11/25/2018 10/29/2017 04/27/2017 03/18/2016  PHQ - 2 Score 0 0 0 0  PHQ- 9 Score - 3 - -     Cognitive Function MMSE - Mini Mental State Exam 10/29/2017  Orientation to time 5  Orientation to Place 5  Registration 3  Attention/ Calculation 4  Recall 1  Language- name 2 objects 2  Language- repeat 1  Language- follow 3 step command 3  Language- read & follow direction 1  Write a sentence 1  Copy design 1  Total score 27     6CIT Screen 11/25/2018  What Year? 0 points  What month? 0 points  What time? 0 points  Count back from 20 0 points  Months in reverse 4 points  Repeat phrase 4 points  Total Score 8    Immunization History  Administered Date(s) Administered  . Influenza Split 01/21/2011, 02/24/2012  . Influenza Whole 02/02/2009, 05/21/2010  . Influenza, High Dose Seasonal PF 03/18/2016, 03/31/2017, 02/10/2018  . Influenza,inj,Quad PF,6+  Mos 01/11/2013, 05/01/2014,  02/08/2015   Screening Tests Health Maintenance  Topic Date Due  . URINE MICROALBUMIN  10/04/1946  . TETANUS/TDAP  10/04/1955  . DEXA SCAN  10/03/2001  . OPHTHALMOLOGY EXAM  04/17/2018  . FOOT EXAM  10/30/2018  . INFLUENZA VACCINE  11/13/2018  . PNA vac Low Risk Adult (1 of 2 - PCV13) 11/25/2019 (Originally 10/03/2001)      Plan:    Reviewed health maintenance screenings with patient today and relevant education, vaccines, and/or referrals were provided.   I have personally reviewed and noted the following in the patient's chart:   . Medical and social history . Use of alcohol, tobacco or illicit drugs  . Current medications and supplements . Functional ability and status . Nutritional status . Physical activity . Advanced directives . List of other physicians . Vitals . Screenings to include cognitive, depression, and falls . Referrals and appointments  In addition, I have reviewed and discussed with patient certain preventive protocols, quality metrics, and best practice recommendations. A written personalized care plan for preventive services as well as general preventive health recommendations were provided to patient.     Michiel Cowboy, RN  11/25/2018   Medical screening examination/treatment/procedure(s) were performed by non-physician practitioner and as supervising physician I was immediately available for consultation/collaboration. I agree with above. Scarlette Calico, MD

## 2018-11-25 ENCOUNTER — Ambulatory Visit (INDEPENDENT_AMBULATORY_CARE_PROVIDER_SITE_OTHER): Payer: Medicare HMO | Admitting: *Deleted

## 2018-11-25 ENCOUNTER — Telehealth: Payer: Self-pay

## 2018-11-25 DIAGNOSIS — Z0000 Encounter for general adult medical examination without abnormal findings: Secondary | ICD-10-CM | POA: Diagnosis not present

## 2018-11-25 NOTE — Telephone Encounter (Signed)
Follow up ° ° °Patient is returning your call. Please call. ° ° ° °

## 2018-11-25 NOTE — Patient Instructions (Signed)
Continue doing brain stimulating activities (puzzles, reading, adult coloring books, staying active) to keep memory sharp.   Continue to eat heart healthy diet (full of fruits, vegetables, whole grains, lean protein, water--limit salt, fat, and sugar intake) and increase physical activity as tolerated.   [name] , Thank you for taking time to come for your Medicare Wellness Visit. I appreciate your ongoing commitment to your health goals. Please review the following plan we discussed and let me know if I can assist you in the future.   These are the goals we discussed: Goals    . Patient Stated     Continue to stay active in my church doing volunteer work and bible studies. Enjoy life and family.       This is a list of the screening recommended for you and due dates:  Health Maintenance  Topic Date Due  . Urine Protein Check  10/04/1944  . Tetanus Vaccine  10/04/1955  . DEXA scan (bone density measurement)  10/03/2001  . Eye exam for diabetics  04/17/2018  . Complete foot exam   10/30/2018  . Flu Shot  11/13/2018  . Pneumonia vaccines (1 of 2 - PCV13) 11/25/2019*  *Topic was postponed. The date shown is not the original due date.     Preventive Care 80 Years and Older, Female Preventive care refers to lifestyle choices and visits with your health care provider that can promote health and wellness. This includes:  A yearly physical exam. This is also called an annual well check.  Regular dental and eye exams.  Immunizations.  Screening for certain conditions.  Healthy lifestyle choices, such as diet and exercise. What can I expect for my preventive care visit? Physical exam Your health care provider will check:  Height and weight. These may be used to calculate body mass index (BMI), which is a measurement that tells if you are at a healthy weight.  Heart rate and blood pressure.  Your skin for abnormal spots. Counseling Your health care provider may ask you  questions about:  Alcohol, tobacco, and drug use.  Emotional well-being.  Home and relationship well-being.  Sexual activity.  Eating habits.  History of falls.  Memory and ability to understand (cognition).  Work and work Statistician.  Pregnancy and menstrual history. What immunizations do I need?  Influenza (flu) vaccine  This is recommended every year. Tetanus, diphtheria, and pertussis (Tdap) vaccine  You may need a Td booster every 10 years. Varicella (chickenpox) vaccine  You may need this vaccine if you have not already been vaccinated. Zoster (shingles) vaccine  You may need this after age 71. Pneumococcal conjugate (PCV13) vaccine  One dose is recommended after age 62. Pneumococcal polysaccharide (PPSV23) vaccine  One dose is recommended after age 32. Measles, mumps, and rubella (MMR) vaccine  You may need at least one dose of MMR if you were born in 1957 or later. You may also need a second dose. Meningococcal conjugate (MenACWY) vaccine  You may need this if you have certain conditions. Hepatitis A vaccine  You may need this if you have certain conditions or if you travel or work in places where you may be exposed to hepatitis A. Hepatitis B vaccine  You may need this if you have certain conditions or if you travel or work in places where you may be exposed to hepatitis B. Haemophilus influenzae type b (Hib) vaccine  You may need this if you have certain conditions. You may receive vaccines as individual  doses or as more than one vaccine together in one shot (combination vaccines). Talk with your health care provider about the risks and benefits of combination vaccines. What tests do I need? Blood tests  Lipid and cholesterol levels. These may be checked every 5 years, or more frequently depending on your overall health.  Hepatitis C test.  Hepatitis B test. Screening  Lung cancer screening. You may have this screening every year starting at  age 59 if you have a 30-pack-year history of smoking and currently smoke or have quit within the past 15 years.  Colorectal cancer screening. All adults should have this screening starting at age 24 and continuing until age 20. Your health care provider may recommend screening at age 53 if you are at increased risk. You will have tests every 1-10 years, depending on your results and the type of screening test.  Diabetes screening. This is done by checking your blood sugar (glucose) after you have not eaten for a while (fasting). You may have this done every 1-3 years.  Mammogram. This may be done every 1-2 years. Talk with your health care provider about how often you should have regular mammograms.  BRCA-related cancer screening. This may be done if you have a family history of breast, ovarian, tubal, or peritoneal cancers. Other tests  Sexually transmitted disease (STD) testing.  Bone density scan. This is done to screen for osteoporosis. You may have this done starting at age 36. Follow these instructions at home: Eating and drinking  Eat a diet that includes fresh fruits and vegetables, whole grains, lean protein, and low-fat dairy products. Limit your intake of foods with high amounts of sugar, saturated fats, and salt.  Take vitamin and mineral supplements as recommended by your health care provider.  Do not drink alcohol if your health care provider tells you not to drink.  If you drink alcohol: ? Limit how much you have to 0-1 drink a day. ? Be aware of how much alcohol is in your drink. In the [place] one drink equals one 12 oz bottle of beer (355 mL), one 5 oz glass of Aki Burdin (148 mL), or one 1 oz glass of hard liquor (44 mL). Lifestyle  Take daily care of your teeth and gums.  Stay active. Exercise for at least 30 minutes on 5 or more days each week.  Do not use any products that contain nicotine or tobacco, such as cigarettes, e-cigarettes, and chewing tobacco. If you need  help quitting, ask your health care provider.  If you are sexually active, practice safe sex. Use a condom or other form of protection in order to prevent STIs (sexually transmitted infections).  Talk with your health care provider about taking a low-dose aspirin or statin. What's next?  Go to your health care provider once a year for a well check visit.  Ask your health care provider how often you should have your eyes and teeth checked.  Stay up to date on all vaccines. This information is not intended to replace advice given to you by your health care provider. Make sure you discuss any questions you have with your health care provider. Document Released: 04/27/2015 Document Revised: 03/25/2018 Document Reviewed: 03/25/2018 Elsevier Patient Education  2020 [employer].

## 2018-11-25 NOTE — Telephone Encounter (Signed)
Called and lmomed the pt to reschedule

## 2018-11-25 NOTE — Telephone Encounter (Signed)
Called and spoke with the patient regarding changing appointments but the pt prefers to stay with their original appt

## 2018-11-30 ENCOUNTER — Other Ambulatory Visit: Payer: Self-pay

## 2018-11-30 ENCOUNTER — Ambulatory Visit (INDEPENDENT_AMBULATORY_CARE_PROVIDER_SITE_OTHER): Payer: Medicare HMO | Admitting: Pharmacist Clinician (PhC)/ Clinical Pharmacy Specialist

## 2018-11-30 DIAGNOSIS — I480 Paroxysmal atrial fibrillation: Secondary | ICD-10-CM | POA: Diagnosis not present

## 2018-11-30 DIAGNOSIS — Z7901 Long term (current) use of anticoagulants: Secondary | ICD-10-CM | POA: Diagnosis not present

## 2018-11-30 LAB — POCT INR: INR: 2.4 (ref 2.0–3.0)

## 2018-12-01 ENCOUNTER — Other Ambulatory Visit: Payer: Self-pay | Admitting: Urology

## 2018-12-01 DIAGNOSIS — D4101 Neoplasm of uncertain behavior of right kidney: Secondary | ICD-10-CM

## 2018-12-10 ENCOUNTER — Telehealth: Payer: Self-pay | Admitting: Internal Medicine

## 2018-12-10 NOTE — Telephone Encounter (Signed)
Yes . I would like her daughter there.  Dr. Lemmie Evens

## 2018-12-10 NOTE — Telephone Encounter (Signed)
Okay for patient daughter to come to appointment. Please advise, thank you!

## 2018-12-10 NOTE — Telephone Encounter (Signed)
  Patient is calling because she wants her daughter Pamala Hurry to come to her appt on 12/14/18 because she helps and talks to the doctor with her.

## 2018-12-10 NOTE — Telephone Encounter (Signed)
Called patient, LVM advising okay for daughter to come. Left call back number if concerns.

## 2018-12-14 ENCOUNTER — Ambulatory Visit (INDEPENDENT_AMBULATORY_CARE_PROVIDER_SITE_OTHER): Payer: Medicare HMO | Admitting: Internal Medicine

## 2018-12-14 ENCOUNTER — Other Ambulatory Visit: Payer: Self-pay

## 2018-12-14 ENCOUNTER — Encounter: Payer: Self-pay | Admitting: Internal Medicine

## 2018-12-14 VITALS — BP 133/83 | HR 65 | Ht 65.0 in | Wt 184.6 lb

## 2018-12-14 DIAGNOSIS — I480 Paroxysmal atrial fibrillation: Secondary | ICD-10-CM

## 2018-12-14 DIAGNOSIS — I10 Essential (primary) hypertension: Secondary | ICD-10-CM

## 2018-12-14 DIAGNOSIS — Z7901 Long term (current) use of anticoagulants: Secondary | ICD-10-CM

## 2018-12-14 DIAGNOSIS — R002 Palpitations: Secondary | ICD-10-CM | POA: Diagnosis not present

## 2018-12-14 NOTE — Patient Instructions (Signed)
Medication Instructions:  Your physician recommends that you continue on your current medications as directed. Please refer to the Current Medication list given to you today.  If you need a refill on your cardiac medications before your next appointment, please call your pharmacy.    Follow-Up: At [facility], you and your health needs are our priority.  As part of our continuing mission to provide you with exceptional heart care, we have created designated Provider Care Teams.  These Care Teams include your primary Cardiologist (physician) and Advanced Practice Providers (APPs -  Physician Assistants and Nurse Practitioners) who all work together to provide you with the care you need, when you need it. You will need a follow up appointment in 12 months.  Please call our office 2 months in advance to schedule this appointment.  You may see Kenneth C Hilty, MD or one of the following Advanced Practice Providers on your designated Care Team: Hao Meng, PA-C . Angela Duke, PA-C  Any Other Special Instructions Will Be Listed Below (If Applicable).    

## 2018-12-14 NOTE — Progress Notes (Signed)
OFFICE NOTE  Chief Complaint:  No complaints  Primary Care Physician: Cassandria Anger, MD  HPI:  [name]  is a 84 year old female with a history of paroxysmal atrial fibrillation and SVT in the past. This happened when she underwent a Myoview for chest pain in the hospital. She was placed on metoprolol succinate 25 b.i.d. and has done well with that with improvement in her palpitations. Her echo showed an EF greater than 55% with some mild aortic insufficiency and an aneurysmal atrial septum. She is on simvastatin as well as Coumadin managed by you. She's recently had some abnormal labs which showed borderline diabetes. She's not currently a medication for that. She reports occasional intermittent chest pain. She underwent a cardia catheterization in the past which showed mild to moderate nonobstructive coronary disease which is managed medically. She does also have bilateral venous insufficiency of the lower chin and knees and has worn compression stockings with improvement in her swelling and symptoms.  [name] returns today without any new complaints. She denies any shortness of breath or chest pain. She does get some occasional chest wall pain and is relieved with tramadol.  I saw [name] back in the office today. Overall she reports she is doing very well. She's had very few episodes of palpitations. She has had a decreased dose in her amlodipine to 5 mg daily, likely due to leg swelling. She is now taking Lasix as needed for edema. She's had a few episodes of racing heart for which she is to perform vagal maneuvers successfully. EKG shows sinus rhythm with PVCs and PACs but no evidence of atrial fibrillation in the office today. She is on warfarin anticoagulation. We will check the INR in the office today.  [name] returns today for follow-up. Unfortunately her son died this past 06/17/22 of leukemia. She says he's been in and out of the hospital for several  months. She has had it small increase in palpitations recently but seems to be controlled with taking and a half dose of metoprolol. She denies any chest pain although has some pain behind her right shoulder blade. This is intermittent and has improved. Is not associated with exertion or relieved by rest. Her EKG today shows normal sinus rhythm without ischemia. Recent A1c was 6.8-she is not on diabetes medications. I do not see a recent lipid profile though she is on simvastatin 20 mg every OD.  04/24/2016  [name] returns today for follow-up. She has no new complaints. It's been about a year since she had the loss of her son due to leukemia. She seems to be doing fairly well although is routinely very calm and demeanor. Her blood pressure is well-controlled. Her INR was therapeutic today. She denies any worsening swelling, chest pain or shortness of breath. She denies any bleeding problems. She's not had any recurrent atrial fibrillation or palpitations that she is aware of. Her last echo was in 2012 and at the time she had mild nonobstructive coronary disease by catheterization. She was also noted to have moderate tricuspid regurgitation and some LVH. Her EKG today shows moderate LVH by voltage criteria.  04/28/2017  [name] returns today for follow-up.  She denies any chest pain or worsening shortness of breath.  She saw her primary care provider yesterday no medicine changes were made.  She is contemplating removing a small mass on the left inner thigh.  He has an appointment with a surgeon coming up and we may  need to hold her warfarin for that.  She has had no significant recurrent atrial fibrillation.  She is in sinus today.  She prefers to stay on warfarin.  INR has been therapeutic and is adjusted every 6 weeks.  04/29/2018  [name] seen today for routine annual follow-up.  Overall she is without complaints.  Her warfarin testing has been stable.  Blood pressures well controlled  today.  She denies chest pain or worsening shortness of breath.  Cholesterol has been at goal.  EKG personally reviewed shows sinus bradycardia and voltage criteria for LVH at 39.  04/2018  [name] seen today for follow-up.  She is accompanied by her daughter.  Overall she seems to be doing well.  She denies any chest pain or worsening shortness of breath.  She denies any symptomatic atrial fibrillation.  She remains on warfarin for anticoagulation.  She also takes metoprolol succinate which we recently increased to 50 mg twice daily for palpitations.  She was in the ER with A. fib and RVR.  She is noted since increasing her beta-blocker that her palpitations have improved.  PMHx:  Past Medical History:  Diagnosis Date  . Anxiety state, unspecified   . Bronchitis, not specified as acute or chronic   . Diaphragmatic hernia without mention of obstruction or gangrene   . Dyslipidemia   . Essential hypertension   . Irritable bowel syndrome   . Mitral valve disorders(424.0)    a. 08/2010 Echo: EF >55%, mild MR, mod TR, trace AI.  [name] Non-obstructive CAD    a. 07/2010 Lexiscan MV: no ischemia, EF 78%; b. 07/2010 Cath: LM nl, LAD50/60p, 40 apical, LCX nl, RCA dominant, 30/40p, 49m  . Osteoarthrosis, unspecified whether generalized or localized, unspecified site   . Osteoporosis, unspecified   . Other abnormal glucose   . PAF (paroxysmal atrial fibrillation) (HCC)    a. CHA2DS2VASc = 4-->coumadin.  .Marland KitchenPhlebitis and thrombophlebitis of superficial vessels of lower extremities   . PSVT ([place]oxysmal [place]ricular tachycardia) ([facility]    a. 05/2012 Holter: short bursts of PSVT noted-->Managed with toprol.  .Marland KitchenUnspecified venous (peripheral) insufficiency   . Unspecified vitamin D deficiency     Past Surgical History:  Procedure Laterality Date  . CARDIAC CATHETERIZATION  07/30/2010   nonobstructive CAD, 20-40% RCA stenosis, 50-60% eccentric LAD stenosis more prox, 40% LAD stenosis more distal, EF  65%  . TRANSTHORACIC ECHOCARDIOGRAM  09/03/2010   EF=>55%; mild MR; mod TR; AV mildly sclerotic with trace regurg; mild pulm valve regurg  . VESICOVAGINAL FISTULA CLOSURE W/ TAH      FAMHx:  Family History  Problem Relation Age of Onset  . Stroke Mother   . Stroke Father        also heart disease  . Clotting disorder Paternal Grandmother        blood clot  . Stroke Brother        also heart disease    SOCHx:   reports that she has never smoked. She has never used smokeless tobacco. She reports that she does not drink alcohol or use drugs.  ALLERGIES:  Allergies  Allergen Reactions  . Clarithromycin     REACTION: unspecified  . Fosamax [Alendronate Sodium]     achy  . Guaifenesin Er     itching  . Metformin     REACTION: pt states INTOL to Metformin "felt drained"  . Tape     Adhesive tape---rash    ROS: Pertinent items noted in HPI and  remainder of comprehensive ROS otherwise negative.  HOME MEDS: Current Outpatient Medications  Medication Sig Dispense Refill  . ACCU-CHEK FASTCLIX LANCETS MISC Use to check blood sugars twice a day Dx E11.9 102 each 2  . ALPRAZolam (XANAX) 0.5 MG tablet TAKE 1/2 TO 1 TABLET BY MOUTH THREE TIMES DAILY AS NEEDED FOR ANXIETY DO NOT TAKE WITH TRAMADOL. This is a 30 day supply 90 tablet 5  . amLODipine (NORVASC) 5 MG tablet TAKE 1 TABLET BY MOUTH EVERY DAY (Patient taking differently: Take 5 mg by mouth daily. ) 90 tablet 3  . aspirin 81 MG tablet Take 81 mg by mouth daily.      . Blood Glucose Monitoring Suppl (ACCU-CHEK AVIVA PLUS) w/Device KIT Use to check blood sugars twice a day Dx E11.9 1 kit 0  . Cholecalciferol (VITAMIN D) 2000 units tablet Take 1 tablet (2,000 Units total) by mouth daily. 100 tablet 2  . clidinium-chlordiazePOXIDE (LIBRAX) 5-2.5 MG capsule TAKE ONE CAPSULE BY MOUTH THREE TIMES DAILY AS NEEDED FOR CRAMPS 60 capsule 2  . furosemide (LASIX) 20 MG tablet Take 1 tablet (20 mg total) by mouth daily as needed for edema.  30 tablet 3  . glucose blood (ACCU-CHEK AVIVA PLUS) test strip Use to check blood sugars twice a day 100 each 2  . hydroxypropyl methylcellulose (ISOPTO TEARS) 2.5 % ophthalmic solution Place 1 drop into both eyes 2 (two) times daily.      . isosorbide mononitrate (IMDUR) 30 MG 24 hr tablet TAKE 1 TABLET BY MOUTH EVERY DAY (Patient taking differently: Take 30 mg by mouth daily. ) 30 tablet 11  . metoprolol succinate (TOPROL-XL) 50 MG 24 hr tablet Take 50 mg by mouth 2 (two) times daily. Pt takes an extra half tablet if she is having palpitations.    . pantoprazole (PROTONIX) 40 MG tablet TAKE 1 TABLET BY MOUTH ONCE DAILY (Patient taking differently: Take 40 mg by mouth daily. ) 90 tablet 3  . simvastatin (ZOCOR) 20 MG tablet TAKE 1 TABLET BY MOUTH AT BEDTIME (Patient taking differently: Take 20 mg by mouth at bedtime. ) 90 tablet 3  . traMADol (ULTRAM) 50 MG tablet Take 1 tablet (50 mg total) by mouth 3 (three) times daily. (Patient taking differently: Take 50 mg by mouth as needed. ) 90 tablet 3  . warfarin (COUMADIN) 5 MG tablet TAKE 1 TABLET BY MOUTH EVERY DAY AS DIRECTED BY COUMADIN CLINIC 90 tablet 0   No current facility-administered medications for this visit.     LABS/IMAGING: No results found for this or any previous visit (from the past 48 hour(s)). No results found.  VITALS: BP 133/83   Pulse 65   Ht '5\' 5"'$  (1.651 m)   Wt 184 lb 9.6 oz (83.7 kg)   BMI 30.72 kg/m   EXAM: General appearance: alert and no distress Neck: no carotid bruit, no JVD and thyroid not enlarged, symmetric, no tenderness/mass/nodules Lungs: clear to auscultation bilaterally Heart: regular rate and rhythm, S1, S2 normal, no murmur, click, rub or gallop Abdomen: soft, non-tender; bowel sounds normal; no masses,  no organomegaly Extremities: extremities normal, atraumatic, no cyanosis or edema Pulses: 2+ and symmetric Skin: Skin color, texture, turgor normal. No rashes or lesions Neurologic: Grossly  normal Psych: Pleasant  EKG: Deferred  ASSESSMENT: 1. Paroxysmal atrial fibrillation on warfarin - CHADSVASC score of 4 2. Hypertension-controlled 3. Dyslipidemia-on statin 4. Overweight 5. Moderate, non-obstructive CAD by cath in 2012 6. PSVT - controlled by b-blocker and vagal maneuvers  7. LVH, mild to moderate TR by echo 2012  PLAN: 1.   Ms. Dutt recently had some A. fib with RVR.  Her beta-blocker was increased further and overall she is done well with that.  She wore a monitor which showed a brief episode of A. fib and some PAT.  She has had PSVT before as well.  I think she will do better on the higher dose beta-blocker for now.  In the future she may need antiarrhythmic therapy.  She has been therapeutic on warfarin.  She is comfortable remaining on that.  No changes to her medicines today.  Plan follow-up in 6 months or sooner as necessary.  Pixie Casino, MD, [facility], [place] Director of the [facility]  [facility] Diplomate of the American Board of Clinical Lipidology Attending Cardiologist  Direct Dial: [phone]  Fax: [phone]  Website:  [email or web address] 12/14/2018, 5:03 PM

## 2018-12-26 ENCOUNTER — Other Ambulatory Visit: Payer: Self-pay | Admitting: Internal Medicine

## 2018-12-26 DIAGNOSIS — I480 Paroxysmal atrial fibrillation: Secondary | ICD-10-CM

## 2019-01-07 ENCOUNTER — Other Ambulatory Visit: Payer: Self-pay | Admitting: Internal Medicine

## 2019-01-12 ENCOUNTER — Ambulatory Visit (INDEPENDENT_AMBULATORY_CARE_PROVIDER_SITE_OTHER): Payer: Medicare HMO | Admitting: Pharmacist

## 2019-01-12 ENCOUNTER — Other Ambulatory Visit: Payer: Self-pay

## 2019-01-12 DIAGNOSIS — Z7901 Long term (current) use of anticoagulants: Secondary | ICD-10-CM

## 2019-01-12 DIAGNOSIS — I480 Paroxysmal atrial fibrillation: Secondary | ICD-10-CM | POA: Diagnosis not present

## 2019-01-12 LAB — POCT INR: INR: 2.3 (ref 2.0–3.0)

## 2019-01-17 ENCOUNTER — Other Ambulatory Visit: Payer: Self-pay | Admitting: Internal Medicine

## 2019-01-17 NOTE — Telephone Encounter (Signed)
rx refill hydroxypropyl methylcellulose (ISOPTO TEARS) 2.5 % ophthalmic solution PHARMACY [facility] [phone] - [place], [place] AT [address] [phone] (Phone) [phone] (Fax)    Patient states she has not used this medication in along time.  Patient states she has itching on her legs and would like to renew prescription. Patient call back # [phone]

## 2019-01-18 NOTE — Telephone Encounter (Signed)
Pt does not need medication listed below, she would like hydroxyzine 25mg  and a cream filled for bumps and itching on her legs.  Please advise

## 2019-01-19 MED ORDER — HYDROXYZINE HCL 25 MG PO TABS: 25.0000 mg | ORAL_TABLET | Freq: Three times a day (TID) | ORAL | 0 refills | Status: DC | PRN

## 2019-01-19 MED ORDER — TRIAMCINOLONE ACETONIDE 0.1 % EX CREA: 1.0000 "application " | TOPICAL_CREAM | Freq: Two times a day (BID) | CUTANEOUS | 0 refills | Status: DC

## 2019-01-19 NOTE — Telephone Encounter (Signed)
Done OV if not well Thx

## 2019-01-19 NOTE — Telephone Encounter (Signed)
Pt informed of below.  

## 2019-01-24 ENCOUNTER — Other Ambulatory Visit: Payer: Self-pay | Admitting: Internal Medicine

## 2019-02-02 ENCOUNTER — Other Ambulatory Visit: Payer: Self-pay | Admitting: Internal Medicine

## 2019-02-02 DIAGNOSIS — Z1231 Encounter for screening mammogram for malignant neoplasm of breast: Secondary | ICD-10-CM

## 2019-02-15 ENCOUNTER — Ambulatory Visit: Payer: Self-pay | Admitting: *Deleted

## 2019-02-15 ENCOUNTER — Ambulatory Visit (INDEPENDENT_AMBULATORY_CARE_PROVIDER_SITE_OTHER): Payer: Medicare HMO | Admitting: Internal Medicine

## 2019-02-15 ENCOUNTER — Encounter: Payer: Self-pay | Admitting: Internal Medicine

## 2019-02-15 ENCOUNTER — Other Ambulatory Visit: Payer: Self-pay

## 2019-02-15 VITALS — BP 130/74 | HR 68 | Temp 99.0°F | Ht 65.0 in | Wt 183.0 lb

## 2019-02-15 DIAGNOSIS — Z23 Encounter for immunization: Secondary | ICD-10-CM | POA: Diagnosis not present

## 2019-02-15 DIAGNOSIS — I2583 Coronary atherosclerosis due to lipid rich plaque: Secondary | ICD-10-CM | POA: Diagnosis not present

## 2019-02-15 DIAGNOSIS — F419 Anxiety disorder, unspecified: Secondary | ICD-10-CM

## 2019-02-15 DIAGNOSIS — M47816 Spondylosis without myelopathy or radiculopathy, lumbar region: Secondary | ICD-10-CM

## 2019-02-15 DIAGNOSIS — I2510 Atherosclerotic heart disease of native coronary artery without angina pectoris: Secondary | ICD-10-CM | POA: Diagnosis not present

## 2019-02-15 DIAGNOSIS — I480 Paroxysmal atrial fibrillation: Secondary | ICD-10-CM

## 2019-02-15 DIAGNOSIS — E119 Type 2 diabetes mellitus without complications: Secondary | ICD-10-CM

## 2019-02-15 MED ORDER — ALPRAZOLAM 0.5 MG PO TABS: ORAL_TABLET | ORAL | 5 refills | Status: DC

## 2019-02-15 NOTE — Telephone Encounter (Signed)
Appointment has been made for today 11/3.

## 2019-02-15 NOTE — Assessment & Plan Note (Signed)
Chronic OA - LBP Tramadol prn  Potential benefits of a long term opioids use as well as potential risks (i.e. addiction risk, apnea etc) and complications (i.e. Somnolence, constipation and others) were explained to the patient and were aknowledged.

## 2019-02-15 NOTE — Progress Notes (Signed)
Subjective:  Patient ID: [name], female    DOB: 1934/05/18  Age: 84 y.o. MRN: [id number]  CC: No chief complaint on file.   HPI CLAUDELL RHODY presents for CAD, A fib F/u anxiety, LBP  Outpatient Medications Prior to Visit  Medication Sig Dispense Refill  . ACCU-CHEK FASTCLIX LANCETS MISC Use to check blood sugars twice a day Dx E11.9 102 each 2  . ALPRAZolam (XANAX) 0.5 MG tablet TAKE 1/2 TO 1 TABLET BY MOUTH THREE TIMES DAILY AS NEEDED FOR ANXIETY DO NOT TAKE WITH TRAMADOL. This is a 30 day supply 90 tablet 5  . amLODipine (NORVASC) 5 MG tablet TAKE 1 TABLET BY MOUTH EVERY DAY (Patient taking differently: Take 5 mg by mouth daily. ) 90 tablet 3  . aspirin 81 MG tablet Take 81 mg by mouth daily.      . Blood Glucose Monitoring Suppl (ACCU-CHEK AVIVA PLUS) w/Device KIT Use to check blood sugars twice a day Dx E11.9 1 kit 0  . Cholecalciferol (VITAMIN D) 2000 units tablet Take 1 tablet (2,000 Units total) by mouth daily. 100 tablet 2  . Cholecalciferol (VITAMIN D3) 50 MCG (2000 UT) capsule TAKE ONE CAPSULE BY MOUTH EVERY DAY 100 capsule 3  . clidinium-chlordiazePOXIDE (LIBRAX) 5-2.5 MG capsule TAKE ONE CAPSULE BY MOUTH THREE TIMES DAILY AS NEEDED FOR CRAMPS 60 capsule 2  . furosemide (LASIX) 20 MG tablet Take 1 tablet (20 mg total) by mouth daily as needed for edema. 30 tablet 3  . glucose blood (ACCU-CHEK AVIVA PLUS) test strip Use to check blood sugars twice a day 100 each 2  . hydroxypropyl methylcellulose (ISOPTO TEARS) 2.5 % ophthalmic solution Place 1 drop into both eyes 2 (two) times daily.      . hydrOXYzine (ATARAX/VISTARIL) 25 MG tablet Take 1 tablet (25 mg total) by mouth every 8 (eight) hours as needed for itching. 60 tablet 0  . isosorbide mononitrate (IMDUR) 30 MG 24 hr tablet TAKE 1 TABLET BY MOUTH EVERY DAY (Patient taking differently: Take 30 mg by mouth daily. ) 30 tablet 11  . metoprolol succinate (TOPROL-XL) 50 MG 24 hr tablet Take 50 mg by mouth 2 (two)  times daily. Pt takes an extra half tablet if she is having palpitations.    . pantoprazole (PROTONIX) 40 MG tablet TAKE 1 TABLET BY MOUTH ONCE DAILY (Patient taking differently: Take 40 mg by mouth daily. ) 90 tablet 3  . simvastatin (ZOCOR) 20 MG tablet TAKE 1 TABLET BY MOUTH AT BEDTIME (Patient taking differently: Take 20 mg by mouth at bedtime. ) 90 tablet 3  . traMADol (ULTRAM) 50 MG tablet Take 1 tablet (50 mg total) by mouth 3 (three) times daily. (Patient taking differently: Take 50 mg by mouth as needed. ) 90 tablet 3  . triamcinolone cream (KENALOG) 0.1 % Apply 1 application topically 2 (two) times daily. 80 g 0  . warfarin (COUMADIN) 5 MG tablet TAKE 1 TABLET BY MOUTH EVERY DAY AS DIRECTED BY COUMADIN CLINIC 90 tablet 0   No facility-administered medications prior to visit.     ROS: Review of Systems  Constitutional: Negative for activity change, appetite change, chills, fatigue and unexpected weight change.  HENT: Negative for congestion, mouth sores and sinus pressure.   Eyes: Negative for visual disturbance.  Respiratory: Negative for cough and chest tightness.   Gastrointestinal: Negative for abdominal pain and nausea.  Genitourinary: Negative for difficulty urinating, frequency and vaginal pain.  Musculoskeletal: Positive for arthralgias and back  pain. Negative for gait problem.  Skin: Negative for pallor and rash.  Neurological: Negative for dizziness, tremors, weakness, numbness and headaches.  Psychiatric/Behavioral: Negative for confusion, sleep disturbance and suicidal ideas. The patient is nervous/anxious.     Objective:  BP 130/74 (BP Location: Left Arm, Patient Position: Sitting, Cuff Size: Large)   Pulse 68   Temp 99 F (37.2 C) (Oral)   Ht '5\' 5"'$  (1.651 m)   Wt 183 lb (83 kg)   SpO2 98%   BMI 30.45 kg/m   BP Readings from Last 3 Encounters:  02/15/19 130/74  12/14/18 133/83  11/09/18 140/80    Wt Readings from Last 3 Encounters:  02/15/19 183 lb (83  kg)  12/14/18 184 lb 9.6 oz (83.7 kg)  11/09/18 182 lb (82.6 kg)    Physical Exam Constitutional:      General: She is not in acute distress.    Appearance: She is well-developed.  HENT:     Head: Normocephalic.     Right Ear: External ear normal.     Left Ear: External ear normal.     Nose: Nose normal.  Eyes:     General:        Right eye: No discharge.        Left eye: No discharge.     Conjunctiva/sclera: Conjunctivae normal.     Pupils: Pupils are equal, round, and reactive to light.  Neck:     Musculoskeletal: Normal range of motion and neck supple.     Thyroid: No thyromegaly.     Vascular: No JVD.     Trachea: No tracheal deviation.  Cardiovascular:     Rate and Rhythm: Normal rate and regular rhythm.     Heart sounds: Normal heart sounds.  Pulmonary:     Effort: No respiratory distress.     Breath sounds: No stridor. No wheezing.  Abdominal:     General: Bowel sounds are normal. There is no distension.     Palpations: Abdomen is soft. There is no mass.     Tenderness: There is no abdominal tenderness. There is no guarding or rebound.  Musculoskeletal:        General: No tenderness.  Lymphadenopathy:     Cervical: No cervical adenopathy.  Skin:    Findings: No erythema or rash.  Neurological:     Mental Status: She is oriented to person, place, and time.     Cranial Nerves: No cranial nerve deficit.     Motor: No abnormal muscle tone.     Coordination: Coordination normal.     Deep Tendon Reflexes: Reflexes normal.  Psychiatric:        Behavior: Behavior normal.        Thought Content: Thought content normal.        Judgment: Judgment normal.     Lab Results  Component Value Date   WBC 8.5 09/12/2018   HGB 13.0 09/12/2018   HCT 40.9 09/12/2018   PLT 219 09/12/2018   GLUCOSE 134 (H) 11/18/2018   CHOL 176 11/18/2018   TRIG 75.0 11/18/2018   HDL 72.20 11/18/2018   LDLDIRECT 105.5 11/26/2010   LDLCALC 89 11/18/2018   ALT 12 11/18/2018   AST 15  11/18/2018   NA 139 11/18/2018   K 4.2 11/18/2018   CL 104 11/18/2018   CREATININE 1.08 11/18/2018   BUN 17 11/18/2018   CO2 29 11/18/2018   TSH 1.39 11/18/2018   INR 2.3 01/12/2019   HGBA1C 7.2 (H) 11/18/2018  No results found.  Assessment & Plan:   There are no diagnoses linked to this encounter.   No orders of the defined types were placed in this encounter.    Follow-up: No follow-ups on file.  Walker Kehr, MD

## 2019-02-15 NOTE — Assessment & Plan Note (Signed)
On Coumadin, Toprol XL 

## 2019-02-15 NOTE — Assessment & Plan Note (Signed)
Xanax prn  Potential benefits of a long term benzodiazepines  use as well as potential risks  and complications were explained to the patient and were aknowledged. Not to take w/Tramadol 

## 2019-02-15 NOTE — Telephone Encounter (Signed)
Pt called in c/o dizziness that started last night.   She is still dizzy this morning.    Not having to hold on to anything to ambulate.  "I'm just walking slower".   She also mentioned she was bitten by a black spider on Thursday or Friday morning on her right ring finger but she can't exactly remember which day.    Denies pain, itching, swelling or redness when asked about it.  She is vague in her answers and I had to "pull" answers from her to get to the bottom of her issues.   She was appropriate but vague.  She is on Warfarin 5 mg for A. Fib.   Denies chest pain. She checked her glucose last night and it was 119.   She is Type II NIDDM.     I warm transferred her call into Dr. Judeen Hammans office to [place] to be scheduled.    "My daughter can bring me in" when asked if she had a way to the appt.  I instructed her to call 911 if she passes out or her symptoms get worse.    Her son is there with her when asked if she was alone.   She verbalized understanding of the care advice. Reason for Disposition . [1] MODERATE dizziness (e.g., interferes with normal activities) AND [2] has NOT been evaluated by physician for this  (Exception: dizziness caused by heat exposure, sudden standing, or poor fluid intake)  Answer Assessment - Initial Assessment Questions 1. DESCRIPTION: "Describe your dizziness."     I'm dizzy and my stomach is not feeling well.    I was bit by a black spider.   I have it in a paper for the doctor to see.     I was bit on my finger on the right hand the finger next to the little finger.     I was bitten either Thursday or Wednesday morning.     I've not been dizzy since the spider bite.   2. LIGHTHEADED: "Do you feel lightheaded?" (e.g., somewhat faint, woozy, weak upon standing)     The dizziness started last night about 6:00 Pm.    I felt like I wanted to up chuck.   I didn't up chuck but I tried.   I'm still dizzy but not as bad as last night.    3. VERTIGO: "Do you feel  like either you or the room is spinning or tilting?" (i.e. vertigo)     I don't feel like the room is spinning around.     I felt like I might pass out last night but not this morning. 4. SEVERITY: "How bad is it?"  "Do you feel like you are going to faint?" "Can you stand and walk?"   - MILD - walking normally   - MODERATE - interferes with normal activities (e.g., work, school)    - SEVERE - unable to stand, requires support to walk, feels like passing out now.      Once I start walking I just walk slower.    5. ONSET:  "When did the dizziness begin?"     Last night 6. AGGRAVATING FACTORS: "Does anything make it worse?" (e.g., standing, change in head position)     Sometimes if I turn my head.    I'm not dizzier when I walk. 7. HEART RATE: "Can you tell me your heart rate?" "How many beats in 15 seconds?"  (Note: not all patients can do this)  Not asked 8. CAUSE: "What do you think is causing the dizziness?"     I don't know.   Maybe coming from the spider bite.    9. RECURRENT SYMPTOM: "Have you had dizziness before?" If so, ask: "When was the last time?" "What happened that time?"     Yes  Years and years ago.    I don't remember what caused the dizziness. 10. OTHER SYMPTOMS: "Do you have any other symptoms?" (e.g., fever, chest pain, vomiting, diarrhea, bleeding)       Last night I had a "pinch of pain in my chest but I don't remember where".    "I have Tramadol".    "I didn't take one".      I take Alprazolam for my nerves every day.   So I didn't take the Tramadol because I'm not supposed to mix these two.   11. PREGNANCY: "Is there any chance you are pregnant?" "When was your last menstrual period?"       N/A  Spider Bite:    "You can't hardly tell it is there".    "I've been putting alcohol on it".   " It's not puffy or nothing".   "No itching"  "It don't hurt".    "Not red" when asked.  Protocols used: DIZZINESS [facility]

## 2019-02-15 NOTE — Assessment & Plan Note (Signed)
  On diet  

## 2019-02-15 NOTE — Assessment & Plan Note (Signed)
Simvastatin, Toprol, Isosorbide, Amlodipine 

## 2019-02-16 NOTE — Addendum Note (Signed)
Addended by: Karren Cobble on: 02/16/2019 09:07 AM   Modules accepted: Orders

## 2019-02-22 ENCOUNTER — Other Ambulatory Visit: Payer: Self-pay

## 2019-02-22 ENCOUNTER — Ambulatory Visit (INDEPENDENT_AMBULATORY_CARE_PROVIDER_SITE_OTHER): Payer: Medicare HMO | Admitting: Pharmacist Clinician (PhC)/ Clinical Pharmacy Specialist

## 2019-02-22 DIAGNOSIS — Z7901 Long term (current) use of anticoagulants: Secondary | ICD-10-CM | POA: Diagnosis not present

## 2019-02-22 DIAGNOSIS — I480 Paroxysmal atrial fibrillation: Secondary | ICD-10-CM | POA: Diagnosis not present

## 2019-02-22 LAB — POCT INR: INR: 2.6 (ref 2.0–3.0)

## 2019-03-04 ENCOUNTER — Other Ambulatory Visit: Payer: Self-pay | Admitting: Internal Medicine

## 2019-03-15 ENCOUNTER — Other Ambulatory Visit: Payer: Medicare HMO

## 2019-03-22 ENCOUNTER — Ambulatory Visit: Payer: Medicare HMO | Admitting: Internal Medicine

## 2019-03-23 ENCOUNTER — Other Ambulatory Visit: Payer: Self-pay

## 2019-03-23 ENCOUNTER — Ambulatory Visit
Admission: RE | Admit: 2019-03-23 | Discharge: 2019-03-23 | Disposition: A | Discharge status: Home / Self Care | Payer: Medicare HMO | Source: Ambulatory Visit | Attending: Internal Medicine | Admitting: Internal Medicine

## 2019-03-23 DIAGNOSIS — Z1231 Encounter for screening mammogram for malignant neoplasm of breast: Secondary | ICD-10-CM | POA: Diagnosis not present

## 2019-03-26 ENCOUNTER — Other Ambulatory Visit: Payer: Self-pay | Admitting: Internal Medicine

## 2019-03-26 DIAGNOSIS — I480 Paroxysmal atrial fibrillation: Secondary | ICD-10-CM

## 2019-04-05 ENCOUNTER — Other Ambulatory Visit: Payer: Self-pay

## 2019-04-05 ENCOUNTER — Ambulatory Visit (INDEPENDENT_AMBULATORY_CARE_PROVIDER_SITE_OTHER): Payer: Medicare HMO | Admitting: Pharmacist Clinician (PhC)/ Clinical Pharmacy Specialist

## 2019-04-05 DIAGNOSIS — Z7901 Long term (current) use of anticoagulants: Secondary | ICD-10-CM | POA: Diagnosis not present

## 2019-04-05 DIAGNOSIS — I480 Paroxysmal atrial fibrillation: Secondary | ICD-10-CM | POA: Diagnosis not present

## 2019-04-05 LAB — POCT INR: INR: 2.7 (ref 2.0–3.0)

## 2019-04-13 ENCOUNTER — Encounter: Payer: Self-pay | Admitting: Internal Medicine

## 2019-04-13 ENCOUNTER — Ambulatory Visit (INDEPENDENT_AMBULATORY_CARE_PROVIDER_SITE_OTHER): Payer: Medicare HMO | Admitting: Internal Medicine

## 2019-04-13 DIAGNOSIS — J329 Chronic sinusitis, unspecified: Secondary | ICD-10-CM | POA: Diagnosis not present

## 2019-04-13 MED ORDER — AZITHROMYCIN 250 MG PO TABS: ORAL_TABLET | ORAL | 0 refills | Status: DC

## 2019-04-13 MED ORDER — PREDNISONE 10 MG PO TABS: ORAL_TABLET | ORAL | 0 refills | Status: DC

## 2019-04-13 NOTE — Patient Instructions (Signed)
Please take all new medication as prescribed  Please continue all other medications as before, and refills have been done if requested.  Please have the pharmacy call with any other refills you may need.  Please continue your efforts at being more active, low cholesterol diet, and weight control.  Please keep your appointments with your specialists as you may have planned    

## 2019-04-13 NOTE — Progress Notes (Signed)
Patient ID: [name], female   DOB: 02/19/1935, 84 y.o.   MRN: [id number]  Phone visit  Cumulative time during 7-day interval 12 min, there was not an associated office visit for this concern within a 7 day period.  Verbal consent for services obtained from patient prior to services given.  Names of all persons present for services: Cathlean Cower, MD, patient  Chief complaint: sinus congestion  History, background, results pertinent:   Here with 2-3 days acute onset fever, facial pain, pressure, headache, general weakness and malaise, and greenish d/c, with mild ST and cough, but pt denies chest pain, wheezing, increased sob or doe, orthopnea, PND, increased LE swelling, palpitations, dizziness or syncope.  Does have several wks ongoing nasal allergy symptoms with clearish congestion, itch and sneezing, without fever, pain, ST, cough, swelling or wheezing. Past Medical History:  Diagnosis Date  . Anxiety state, unspecified   . Bronchitis, not specified as acute or chronic   . Diaphragmatic hernia without mention of obstruction or gangrene   . Dyslipidemia   . Essential hypertension   . Irritable bowel syndrome   . Mitral valve disorders(424.0)    a. 08/2010 Echo: EF >55%, mild MR, mod TR, trace AI.  [name] Non-obstructive CAD    a. 07/2010 Lexiscan MV: no ischemia, EF 78%; b. 07/2010 Cath: LM nl, LAD50/60p, 40 apical, LCX nl, RCA dominant, 30/40p, 60m  . Osteoarthrosis, unspecified whether generalized or localized, unspecified site   . Osteoporosis, unspecified   . Other abnormal glucose   . PAF (paroxysmal atrial fibrillation) (HCC)    a. CHA2DS2VASc = 4-->coumadin.  .Marland KitchenPhlebitis and thrombophlebitis of superficial vessels of lower extremities   . [place]    a. 05/2012 Holter: short bursts of PSVT noted-->Managed with toprol.  .Marland KitchenUnspecified venous (peripheral) insufficiency   . Unspecified vitamin D deficiency    Past Surgical History:   Procedure Laterality Date  . CARDIAC CATHETERIZATION  07/30/2010   nonobstructive CAD, 20-40% RCA stenosis, 50-60% eccentric LAD stenosis more prox, 40% LAD stenosis more distal, EF 65%  . TRANSTHORACIC ECHOCARDIOGRAM  09/03/2010   EF=>55%; mild MR; mod TR; AV mildly sclerotic with trace regurg; mild pulm valve regurg  . VESICOVAGINAL FISTULA CLOSURE W/ TAH      reports that she has never smoked. She has never used smokeless tobacco. She reports that she does not drink alcohol or use drugs. family history includes Clotting disorder in her paternal grandmother; Stroke in her brother, father, and mother. Allergies  Allergen Reactions  . Clarithromycin     REACTION: unspecified  . Fosamax [Alendronate Sodium]     achy  . Guaifenesin Er     itching  . Metformin     REACTION: pt states INTOL to Metformin "felt drained"  . Tape     Adhesive tape---rash   Current Outpatient Medications on File Prior to Visit  Medication Sig Dispense Refill  . ALPRAZolam (XANAX) 0.5 MG tablet TAKE 1/2 TO 1 TABLET BY MOUTH THREE TIMES DAILY AS NEEDED FOR ANXIETY DO NOT TAKE WITH TRAMADOL. This is a 30 day supply 90 tablet 5  . amLODipine (NORVASC) 5 MG tablet TAKE 1 TABLET BY MOUTH EVERY DAY (Patient taking differently: Take 5 mg by mouth daily. ) 90 tablet 3  . aspirin 81 MG tablet Take 81 mg by mouth daily.      . Cholecalciferol (VITAMIN D) 2000 units tablet Take 1 tablet (2,000 Units total) by mouth daily. 100 tablet 2  .  Cholecalciferol (VITAMIN D3) 50 MCG (2000 UT) capsule TAKE ONE CAPSULE BY MOUTH EVERY DAY 100 capsule 3  . furosemide (LASIX) 20 MG tablet Take 1 tablet (20 mg total) by mouth daily as needed for edema. 30 tablet 3  . hydroxypropyl methylcellulose (ISOPTO TEARS) 2.5 % ophthalmic solution Place 1 drop into both eyes 2 (two) times daily.      . isosorbide mononitrate (IMDUR) 30 MG 24 hr tablet TAKE 1 TABLET BY MOUTH EVERY DAY (Patient taking differently: Take 30 mg by mouth daily. ) 30  tablet 11  . metoprolol succinate (TOPROL-XL) 50 MG 24 hr tablet Take 50 mg by mouth 2 (two) times daily. Pt takes an extra half tablet if she is having palpitations.    . simvastatin (ZOCOR) 20 MG tablet TAKE 1 TABLET BY MOUTH AT BEDTIME 90 tablet 3  . traMADol (ULTRAM) 50 MG tablet Take 1 tablet (50 mg total) by mouth 3 (three) times daily. 90 tablet 3  . triamcinolone cream (KENALOG) 0.1 % Apply 1 application topically 2 (two) times daily. 80 g 0  . warfarin (COUMADIN) 5 MG tablet TAKE 1 TABLET BY MOUTH EVERY DAY AS DIRECTED BY COUMADIN CLINIC 90 tablet 0  . ACCU-CHEK FASTCLIX LANCETS MISC Use to check blood sugars twice a day Dx E11.9 102 each 2  . Blood Glucose Monitoring Suppl (ACCU-CHEK AVIVA PLUS) w/Device KIT Use to check blood sugars twice a day Dx E11.9 1 kit 0  . clidinium-chlordiazePOXIDE (LIBRAX) 5-2.5 MG capsule TAKE ONE CAPSULE BY MOUTH THREE TIMES DAILY AS NEEDED FOR CRAMPS (Patient not taking: Reported on 04/13/2019) 60 capsule 2  . glucose blood (ACCU-CHEK AVIVA PLUS) test strip Use to check blood sugars twice a day 100 each 2  . hydrOXYzine (ATARAX/VISTARIL) 25 MG tablet Take 1 tablet (25 mg total) by mouth every 8 (eight) hours as needed for itching. (Patient not taking: Reported on 04/13/2019) 60 tablet 0  . pantoprazole (PROTONIX) 40 MG tablet TAKE 1 TABLET BY MOUTH ONCE DAILY (Patient not taking: No sig reported) 90 tablet 3   No current facility-administered medications on file prior to visit.   A/P/next steps:   1) sinusitis - Mild to mod, for antibx course,  to f/u any worsening symptoms or concerns  2)  Congestion - ? Allergies - for predpac asd,  to f/u any worsening symptoms or concerns  Cathlean Cower MD

## 2019-04-18 ENCOUNTER — Telehealth: Payer: Self-pay | Admitting: Internal Medicine

## 2019-04-18 NOTE — Telephone Encounter (Signed)
Please advise 

## 2019-04-18 NOTE — Telephone Encounter (Signed)
Patient called stating since starting prednisone they have noticed BP and blood sugar numbers elevate a large amount. Pt stated BP was hitting almost 300/100s and blood sugars around 200s. Pt is wanting to know if she should continue to take medicine and if so if she could have an alternative. Please advise. Call back is [phone].

## 2019-04-19 NOTE — Telephone Encounter (Signed)
Pt.notified

## 2019-04-19 NOTE — Telephone Encounter (Signed)
Stop prednisone Finish Z pac Thx

## 2019-04-26 DIAGNOSIS — H353121 Nonexudative age-related macular degeneration, left eye, early dry stage: Secondary | ICD-10-CM | POA: Diagnosis not present

## 2019-04-26 DIAGNOSIS — H04123 Dry eye syndrome of bilateral lacrimal glands: Secondary | ICD-10-CM | POA: Diagnosis not present

## 2019-04-26 DIAGNOSIS — H02831 Dermatochalasis of right upper eyelid: Secondary | ICD-10-CM | POA: Diagnosis not present

## 2019-04-26 DIAGNOSIS — H02834 Dermatochalasis of left upper eyelid: Secondary | ICD-10-CM | POA: Diagnosis not present

## 2019-04-26 DIAGNOSIS — H25813 Combined forms of age-related cataract, bilateral: Secondary | ICD-10-CM | POA: Diagnosis not present

## 2019-04-26 DIAGNOSIS — E119 Type 2 diabetes mellitus without complications: Secondary | ICD-10-CM | POA: Diagnosis not present

## 2019-05-02 ENCOUNTER — Ambulatory Visit (INDEPENDENT_AMBULATORY_CARE_PROVIDER_SITE_OTHER): Payer: Medicare HMO | Admitting: Internal Medicine

## 2019-05-02 ENCOUNTER — Other Ambulatory Visit: Payer: Self-pay

## 2019-05-02 ENCOUNTER — Encounter: Payer: Self-pay | Admitting: Internal Medicine

## 2019-05-02 DIAGNOSIS — I480 Paroxysmal atrial fibrillation: Secondary | ICD-10-CM

## 2019-05-02 DIAGNOSIS — E785 Hyperlipidemia, unspecified: Secondary | ICD-10-CM | POA: Diagnosis not present

## 2019-05-02 DIAGNOSIS — Z7901 Long term (current) use of anticoagulants: Secondary | ICD-10-CM

## 2019-05-02 DIAGNOSIS — E119 Type 2 diabetes mellitus without complications: Secondary | ICD-10-CM | POA: Diagnosis not present

## 2019-05-02 LAB — CBC WITH DIFFERENTIAL/PLATELET
Basophils Absolute: 0 10*3/uL (ref 0.0–0.1)
Basophils Relative: 0.2 % (ref 0.0–3.0)
Eosinophils Absolute: 0 10*3/uL (ref 0.0–0.7)
Eosinophils Relative: 0.9 % (ref 0.0–5.0)
HCT: 36.2 % (ref 36.0–46.0)
Hemoglobin: 12 g/dL (ref 12.0–15.0)
Lymphocytes Relative: 23.5 % (ref 12.0–46.0)
Lymphs Abs: 1 10*3/uL (ref 0.7–4.0)
MCHC: 33.1 g/dL (ref 30.0–36.0)
MCV: 88.6 fl (ref 78.0–100.0)
Monocytes Absolute: 0.4 10*3/uL (ref 0.1–1.0)
Monocytes Relative: 8.9 % (ref 3.0–12.0)
Neutro Abs: 2.8 10*3/uL (ref 1.4–7.7)
Neutrophils Relative %: 66.5 % (ref 43.0–77.0)
Platelets: 210 10*3/uL (ref 150.0–400.0)
RBC: 4.08 Mil/uL (ref 3.87–5.11)
RDW: 14.2 % (ref 11.5–15.5)
WBC: 4.2 10*3/uL (ref 4.0–10.5)

## 2019-05-02 LAB — BASIC METABOLIC PANEL
BUN: 13 mg/dL (ref 6–23)
CO2: 30 mEq/L (ref 19–32)
Calcium: 9.3 mg/dL (ref 8.4–10.5)
Chloride: 102 mEq/L (ref 96–112)
Creatinine, Ser: 1 mg/dL (ref 0.40–1.20)
GFR: 64.14 mL/min (ref >60.00)
Glucose, Bld: 177 mg/dL — ABNORMAL HIGH (ref 70–99)
Potassium: 3.6 mEq/L (ref 3.5–5.1)
Sodium: 138 mEq/L (ref 135–145)

## 2019-05-02 LAB — HEPATIC FUNCTION PANEL
ALT: 12 U/L (ref 0–35)
AST: 19 U/L (ref 0–37)
Albumin: 4.2 g/dL (ref 3.5–5.2)
Alkaline Phosphatase: 54 U/L (ref 39–117)
Bilirubin, Direct: 0.1 mg/dL (ref 0.0–0.3)
Total Bilirubin: 0.5 mg/dL (ref 0.2–1.2)
Total Protein: 7.2 g/dL (ref 6.0–8.3)

## 2019-05-02 LAB — TSH: TSH: 1 u[IU]/mL (ref 0.35–4.50)

## 2019-05-02 LAB — HEMOGLOBIN A1C: Hgb A1c MFr Bld: 7.4 % — ABNORMAL HIGH (ref 4.6–6.5)

## 2019-05-02 MED ORDER — CILIDINIUM-CHLORDIAZEPOXIDE 2.5-5 MG PO CAPS: ORAL_CAPSULE | ORAL | 2 refills | Status: DC

## 2019-05-02 NOTE — Assessment & Plan Note (Signed)
On Coumadin ? Potential benefits of a long term Coumadin use as well as potential risks  and complications were explained to the patient and were aknowledged. ?

## 2019-05-02 NOTE — Progress Notes (Signed)
Subjective:  Patient ID: [name], female    DOB: 1934-11-22  Age: 85 y.o. MRN: [id number]  CC: No chief complaint on file.   HPI [name] presents for HTN, anxiety, GERD and OA f/u The pt fell the other day - tripped at home. No LOC. On Coumadin...  Outpatient Medications Prior to Visit  Medication Sig Dispense Refill  . ACCU-CHEK FASTCLIX LANCETS MISC Use to check blood sugars twice a day Dx E11.9 102 each 2  . ALPRAZolam (XANAX) 0.5 MG tablet TAKE 1/2 TO 1 TABLET BY MOUTH THREE TIMES DAILY AS NEEDED FOR ANXIETY DO NOT TAKE WITH TRAMADOL. This is a 30 day supply 90 tablet 5  . amLODipine (NORVASC) 5 MG tablet TAKE 1 TABLET BY MOUTH EVERY DAY (Patient taking differently: Take 5 mg by mouth daily. ) 90 tablet 3  . aspirin 81 MG tablet Take 81 mg by mouth daily.      . Blood Glucose Monitoring Suppl (ACCU-CHEK AVIVA PLUS) w/Device KIT Use to check blood sugars twice a day Dx E11.9 1 kit 0  . Cholecalciferol (VITAMIN D) 2000 units tablet Take 1 tablet (2,000 Units total) by mouth daily. 100 tablet 2  . Cholecalciferol (VITAMIN D3) 50 MCG (2000 UT) capsule TAKE ONE CAPSULE BY MOUTH EVERY DAY 100 capsule 3  . clidinium-chlordiazePOXIDE (LIBRAX) 5-2.5 MG capsule TAKE ONE CAPSULE BY MOUTH THREE TIMES DAILY AS NEEDED FOR CRAMPS 60 capsule 2  . furosemide (LASIX) 20 MG tablet Take 1 tablet (20 mg total) by mouth daily as needed for edema. 30 tablet 3  . glucose blood (ACCU-CHEK AVIVA PLUS) test strip Use to check blood sugars twice a day 100 each 2  . hydroxypropyl methylcellulose (ISOPTO TEARS) 2.5 % ophthalmic solution Place 1 drop into both eyes 2 (two) times daily.      . hydrOXYzine (ATARAX/VISTARIL) 25 MG tablet Take 1 tablet (25 mg total) by mouth every 8 (eight) hours as needed for itching. 60 tablet 0  . isosorbide mononitrate (IMDUR) 30 MG 24 hr tablet TAKE 1 TABLET BY MOUTH EVERY DAY (Patient taking differently: Take 30 mg by mouth daily. ) 30 tablet 11  . metoprolol  succinate (TOPROL-XL) 50 MG 24 hr tablet Take 50 mg by mouth 2 (two) times daily. Pt takes an extra half tablet if she is having palpitations.    . pantoprazole (PROTONIX) 40 MG tablet TAKE 1 TABLET BY MOUTH ONCE DAILY 90 tablet 3  . predniSONE (DELTASONE) 10 MG tablet 1 tabs by mouth per day for 5 days 5 tablet 0  . simvastatin (ZOCOR) 20 MG tablet TAKE 1 TABLET BY MOUTH AT BEDTIME 90 tablet 3  . traMADol (ULTRAM) 50 MG tablet Take 1 tablet (50 mg total) by mouth 3 (three) times daily. 90 tablet 3  . triamcinolone cream (KENALOG) 0.1 % Apply 1 application topically 2 (two) times daily. 80 g 0  . warfarin (COUMADIN) 5 MG tablet TAKE 1 TABLET BY MOUTH EVERY DAY AS DIRECTED BY COUMADIN CLINIC 90 tablet 0  . azithromycin (ZITHROMAX) 250 MG tablet 2 tab by mouth x 1 day, then 1 per day (Patient not taking: Reported on 05/02/2019) 6 tablet 0   No facility-administered medications prior to visit.    ROS: Review of Systems  Constitutional: Negative for activity change, appetite change, chills, fatigue and unexpected weight change.  HENT: Negative for congestion, mouth sores and sinus pressure.   Eyes: Negative for visual disturbance.  Respiratory: Negative for cough and chest  tightness.   Gastrointestinal: Negative for abdominal pain and nausea.  Genitourinary: Negative for difficulty urinating, frequency and vaginal pain.  Musculoskeletal: Positive for arthralgias. Negative for back pain and gait problem.  Skin: Negative for pallor and rash.  Neurological: Negative for dizziness, tremors, weakness, numbness and headaches.  Psychiatric/Behavioral: Negative for confusion and sleep disturbance.    Objective:  BP 132/76 (BP Location: Left Arm, Patient Position: Sitting, Cuff Size: Large)   Pulse 70   Temp 98.1 F (36.7 C) (Oral)   Ht _0  (1.676 m)   Wt 187 lb (84.8 kg)   SpO2 96%   BMI 30.18 kg/m   BP Readings from Last 3 Encounters:  05/02/19 132/76  02/15/19 130/74  12/14/18 133/83      Wt Readings from Last 3 Encounters:  05/02/19 187 lb (84.8 kg)  04/13/19 183 lb (83 kg)  02/15/19 183 lb (83 kg)    Physical Exam Constitutional:      General: She is not in acute distress.    Appearance: She is well-developed. She is obese.  HENT:     Head: Normocephalic.     Right Ear: External ear normal.     Left Ear: External ear normal.     Nose: Nose normal.  Eyes:     General:        Right eye: No discharge.        Left eye: No discharge.     Conjunctiva/sclera: Conjunctivae normal.     Pupils: Pupils are equal, round, and reactive to light.  Neck:     Thyroid: No thyromegaly.     Vascular: No JVD.     Trachea: No tracheal deviation.  Cardiovascular:     Rate and Rhythm: Normal rate and regular rhythm.     Heart sounds: Normal heart sounds.  Pulmonary:     Effort: No respiratory distress.     Breath sounds: No stridor. No wheezing.  Abdominal:     General: Bowel sounds are normal. There is no distension.     Palpations: Abdomen is soft. There is no mass.     Tenderness: There is no abdominal tenderness. There is no guarding or rebound.  Musculoskeletal:        General: No tenderness.     Cervical back: Normal range of motion and neck supple.  Lymphadenopathy:     Cervical: No cervical adenopathy.  Skin:    Findings: No erythema or rash.  Neurological:     Cranial Nerves: No cranial nerve deficit.     Motor: No abnormal muscle tone.     Coordination: Coordination normal.     Deep Tendon Reflexes: Reflexes normal.  Psychiatric:        Behavior: Behavior normal.        Thought Content: Thought content normal.        Judgment: Judgment normal.     Lab Results  Component Value Date   WBC 8.5 09/12/2018   HGB 13.0 09/12/2018   HCT 40.9 09/12/2018   PLT 219 09/12/2018   GLUCOSE 134 (H) 11/18/2018   CHOL 176 11/18/2018   TRIG 75.0 11/18/2018   HDL 72.20 11/18/2018   LDLDIRECT 105.5 11/26/2010   LDLCALC 89 11/18/2018   ALT 12 11/18/2018   AST 15  11/18/2018   NA 139 11/18/2018   K 4.2 11/18/2018   CL 104 11/18/2018   CREATININE 1.08 11/18/2018   BUN 17 11/18/2018   CO2 29 11/18/2018   TSH 1.39 11/18/2018  INR 2.7 04/05/2019   HGBA1C 7.2 (H) 11/18/2018    MM 3D SCREEN BREAST BILATERAL  Result Date: 03/23/2019 CLINICAL DATA:  Screening. EXAM: DIGITAL SCREENING BILATERAL MAMMOGRAM WITH TOMO AND CAD COMPARISON:  Previous exam(s). ACR Breast Density Category c: The breast tissue is heterogeneously dense, which may obscure small masses. FINDINGS: There are no findings suspicious for malignancy. Images were processed with CAD. IMPRESSION: No mammographic evidence of malignancy. A result letter of this screening mammogram will be mailed directly to the patient. RECOMMENDATION: Screening mammogram in one year. (Code:SM-B-01Y) BI-RADS CATEGORY  1: Negative. Electronically Signed   By: Fidela Salisbury M.D.   On: 03/23/2019 16:29    Assessment & Plan:   There are no diagnoses linked to this encounter.   No orders of the defined types were placed in this encounter.    Follow-up: No follow-ups on file.  Walker Kehr, MD

## 2019-05-02 NOTE — Assessment & Plan Note (Signed)
Simvastatin 

## 2019-05-02 NOTE — Assessment & Plan Note (Signed)
Labs

## 2019-05-03 ENCOUNTER — Other Ambulatory Visit: Payer: Self-pay | Admitting: Internal Medicine

## 2019-05-13 ENCOUNTER — Telehealth: Payer: Self-pay

## 2019-05-13 ENCOUNTER — Other Ambulatory Visit: Payer: Medicare HMO

## 2019-05-13 NOTE — Telephone Encounter (Signed)
ACCCHECK AVIVA PLUS METER & TEST STRIPS & SOFT CLICKS LANCETS & BD ALCOHOL SWABS & ACCUCHECK AVIVA SOLUTION. [employer] ppharm rep [place] request scripts for pt.  Can be escribe or ph call or faxed scripts.  [phone].

## 2019-05-16 MED ORDER — ACCU-CHEK SOFT TOUCH LANCETS MISC: 3 refills | Status: DC

## 2019-05-16 MED ORDER — ACCU-CHEK AVIVA PLUS VI STRP: ORAL_STRIP | 3 refills | Status: DC

## 2019-05-16 MED ORDER — BD SWAB SINGLE USE REGULAR PADS: MEDICATED_PAD | 3 refills | Status: DC

## 2019-05-16 MED ORDER — ACCU-CHEK AVIVA PLUS W/DEVICE KIT: PACK | 0 refills | Status: DC

## 2019-05-16 MED ORDER — ACCU-CHEK AVIVA VI SOLN: 3 refills | Status: DC

## 2019-05-16 NOTE — Telephone Encounter (Signed)
Reviewed chart pt is up-to-date sent refills to [facility].Marland KitchenJohny Chess

## 2019-05-17 ENCOUNTER — Other Ambulatory Visit: Payer: Self-pay

## 2019-05-17 ENCOUNTER — Telehealth: Payer: Medicare HMO | Admitting: Internal Medicine

## 2019-05-17 ENCOUNTER — Ambulatory Visit (INDEPENDENT_AMBULATORY_CARE_PROVIDER_SITE_OTHER): Payer: Medicare HMO | Admitting: Pharmacist Clinician (PhC)/ Clinical Pharmacy Specialist

## 2019-05-17 DIAGNOSIS — I480 Paroxysmal atrial fibrillation: Secondary | ICD-10-CM

## 2019-05-17 DIAGNOSIS — Z7901 Long term (current) use of anticoagulants: Secondary | ICD-10-CM

## 2019-05-17 LAB — POCT INR: INR: 2.3 (ref 2.0–3.0)

## 2019-05-18 ENCOUNTER — Ambulatory Visit: Payer: Medicare HMO | Admitting: Internal Medicine

## 2019-05-26 NOTE — Progress Notes (Signed)
Cardiology Office Note:    Date:  05/27/2019   ID:  [name], DOB 01/17/1935, MRN [id number]  PCP:  Cassandria Anger, MD  Cardiologist:  Pixie Casino, MD   Referring MD: Cassandria Anger, MD   Chief Complaint  Patient presents with  . Follow-up    palpitations    History of Present Illness:    [name] is a 85 y.o. female with a hx of paroxysmal atrial fibrillation and SVT, hypertension and hyperlipidemia.  Nonobstructive CAD by remote heart cath (?2012).  She is anticoagulated with Coumadin and controlled with beta-blocker.  She was seen in the ER on 09/12/2018 for racing heart rate found to have A. fib RVR.  This was easily treated with IV Lopressor with good response.  She wore heart monitor following this hospitalization which showed a low burden of A. Fib, with episodes of NSVT.  Recommended increasing beta-blocker.  Echo in 2018 revealed normal EF and mild diastolic dysfunction.  She was last seen by Dr. Debara Pickett on 12/14/2018.  She is maintained on metoprolol succinate 50 mg twice daily.  She takes an extra 25 mg for palpitations.  She is on as needed Lasix, and imdur daily.  She presents today for follow up. She is here with her daughter. She takes lasix 1-2 times per week. They have many questions about her medications. We discuss her meds at length. She has two benzodiazepines listed on her med list. I advised against taking librium, which she says she doesn't take anyway. I also discuss not taking tramadol with her xanax. They request something for anxiety so she can have her scheduled MRI ordered by nephrology. I reviewed that she is already taking xanax and that they should tough base with nephrology for anything additional.   She also reports 2 days of palpitations at night. One episode lasted 2 hours. She did not want to take the PRN 25 mg toprol. We discussed that palpitations and racing heart could lead to hypotension and that controlling these would likely  reduce her risk of falls on coumadin. I advised taking PRN lopressor for palpitations lasting greater than 30 min. She has no other complaints.    Past Medical History:  Diagnosis Date  . Anxiety state, unspecified   . Bronchitis, not specified as acute or chronic   . Diaphragmatic hernia without mention of obstruction or gangrene   . Dyslipidemia   . Essential hypertension   . Irritable bowel syndrome   . Mitral valve disorders(424.0)    a. 08/2010 Echo: EF >55%, mild MR, mod TR, trace AI.  [name] Non-obstructive CAD    a. 07/2010 Lexiscan MV: no ischemia, EF 78%; b. 07/2010 Cath: LM nl, LAD50/60p, 40 apical, LCX nl, RCA dominant, 30/40p, 54m  . Osteoarthrosis, unspecified whether generalized or localized, unspecified site   . Osteoporosis, unspecified   . Other abnormal glucose   . PAF (paroxysmal atrial fibrillation) (HCC)    a. CHA2DS2VASc = 4-->coumadin.  .Marland KitchenPhlebitis and thrombophlebitis of superficial vessels of lower extremities   . PSVT ([place]oxysmal supra[place]ricular tachycardia) ([place]    a. 05/2012 Holter: short bursts of PSVT noted-->Managed with toprol.  .Marland KitchenUnspecified venous (peripheral) insufficiency   . Unspecified vitamin D deficiency     Past Surgical History:  Procedure Laterality Date  . CARDIAC CATHETERIZATION  07/30/2010   nonobstructive CAD, 20-40% RCA stenosis, 50-60% eccentric LAD stenosis more prox, 40% LAD stenosis more distal, EF 65%  . TRANSTHORACIC ECHOCARDIOGRAM  09/03/2010  EF=>55%; mild MR; mod TR; AV mildly sclerotic with trace regurg; mild pulm valve regurg  . VESICOVAGINAL FISTULA CLOSURE W/ TAH      Current Medications: Current Meds  Medication Sig  . ACCU-CHEK FASTCLIX LANCETS MISC Use to check blood sugars twice a day Dx E11.9  . Alcohol Swabs (B-D SINGLE USE SWABS REGULAR) PADS Use to clean injection site to check blood sugars daily  . ALPRAZolam (XANAX) 0.5 MG tablet TAKE 1/2 TO 1 TABLET BY MOUTH THREE TIMES DAILY AS NEEDED FOR ANXIETY DO NOT TAKE  WITH TRAMADOL. This is a 30 day supply  . amLODipine (NORVASC) 5 MG tablet TAKE 1 TABLET BY MOUTH EVERY DAY (Patient taking differently: Take 5 mg by mouth daily. )  . aspirin 81 MG tablet Take 81 mg by mouth daily.    . Blood Glucose Calibration (ACCU-CHEK AVIVA) SOLN Use to calibrate BS monitor  . Blood Glucose Monitoring Suppl (ACCU-CHEK AVIVA PLUS) w/Device KIT Use to check blood sugars twice a day Dx E11.9  . Blood Glucose Monitoring Suppl (ACCU-CHEK AVIVA PLUS) w/Device KIT Use to check blood sugars daily  . Cholecalciferol (VITAMIN D3) 50 MCG (2000 UT) capsule TAKE ONE CAPSULE BY MOUTH EVERY DAY  . clidinium-chlordiazePOXIDE (LIBRAX) 5-2.5 MG capsule TAKE ONE CAPSULE BY MOUTH THREE TIMES DAILY AS NEEDED FOR CRAMPS  . furosemide (LASIX) 20 MG tablet Take 1 tablet (20 mg total) by mouth daily as needed for edema.  [name] glucose blood (ACCU-CHEK AVIVA PLUS) test strip Use to check blood sugars twice a day  . glucose blood (ACCU-CHEK AVIVA PLUS) test strip Use to check blood sugars daily  . hydroxypropyl methylcellulose (ISOPTO TEARS) 2.5 % ophthalmic solution Place 1 drop into both eyes 2 (two) times daily.    . hydrOXYzine (ATARAX/VISTARIL) 25 MG tablet Take 1 tablet (25 mg total) by mouth every 8 (eight) hours as needed for itching.  . isosorbide mononitrate (IMDUR) 30 MG 24 hr tablet TAKE 1 TABLET BY MOUTH EVERY DAY (Patient taking differently: Take 30 mg by mouth daily. )  . Lancets (ACCU-CHEK SOFT TOUCH) lancets Use as instructed  . metoprolol succinate (TOPROL-XL) 50 MG 24 hr tablet Take 50 mg by mouth 2 (two) times daily. Pt takes an extra half tablet if she is having palpitations.  . pantoprazole (PROTONIX) 40 MG tablet TAKE 1 TABLET BY MOUTH ONCE DAILY  . simvastatin (ZOCOR) 20 MG tablet TAKE 1 TABLET BY MOUTH AT BEDTIME  . traMADol (ULTRAM) 50 MG tablet Take 1 tablet (50 mg total) by mouth 3 (three) times daily.  [name] triamcinolone cream (KENALOG) 0.1 % Apply 1 application topically 2 (two)  times daily.  [name] warfarin (COUMADIN) 5 MG tablet TAKE 1 TABLET BY MOUTH EVERY DAY AS DIRECTED BY COUMADIN CLINIC  . [DISCONTINUED] Cholecalciferol (VITAMIN D) 2000 units tablet Take 1 tablet (2,000 Units total) by mouth daily.     Allergies:   Clarithromycin, Fosamax [alendronate sodium], Guaifenesin er, Metformin, and Tape   Social History   Socioeconomic History  . Marital status: Widowed    Spouse name: Not on file  . Number of children: 4  . Years of education: Not on file  . Highest education level: Not on file  Occupational History  . Occupation: retired    Fish farm manager: RETIRED  Tobacco Use  . Smoking status: Never Smoker  . Smokeless tobacco: Never Used  Substance and Sexual Activity  . Alcohol use: No  . Drug use: No  . Sexual activity: Not Currently  Other Topics Concern  . Not on file  Social History Narrative  . Not on file   Social Determinants of Health   Financial Resource Strain:   . Difficulty of Paying Living Expenses: Not on file  Food Insecurity:   . Worried About Charity fundraiser in the Last Year: Not on file  . Ran Out of Food in the Last Year: Not on file  Transportation Needs:   . Lack of Transportation (Medical): Not on file  . Lack of Transportation (Non-Medical): Not on file  Physical Activity:   . Days of Exercise per Week: Not on file  . Minutes of Exercise per Session: Not on file  Stress:   . Feeling of Stress : Not on file  Social Connections:   . Frequency of Communication with Friends and Family: Not on file  . Frequency of Social Gatherings with Friends and Family: Not on file  . Attends Religious Services: Not on file  . Active Member of Clubs or Organizations: Not on file  . Attends Archivist Meetings: Not on file  . Marital Status: Not on file     Family History: The patient's family history includes Clotting disorder in her paternal grandmother; Stroke in her brother, father, and mother.  ROS:   Please see the  history of present illness.     All other systems reviewed and are negative.  EKGs/Labs/Other Studies Reviewed:    The following studies were reviewed today:  Event monitor 10/2018: Short periods of atrial fibrillation as well as some NSVT, PAC's and PVC's.  EKG:  EKG is not ordered today.  Recent Labs: 09/12/2018: Magnesium 2.0 05/02/2019: ALT 12; BUN 13; Creatinine, Ser 1.00; Hemoglobin 12.0; Platelets 210.0; Potassium 3.6; Sodium 138; TSH 1.00  Recent Lipid Panel    Component Value Date/Time   CHOL 176 11/18/2018 1024   CHOL 160 06/09/2018 1046   TRIG 75.0 11/18/2018 1024   HDL 72.20 11/18/2018 1024   HDL 72 06/09/2018 1046   CHOLHDL 2 11/18/2018 1024   VLDL 15.0 11/18/2018 1024   LDLCALC 89 11/18/2018 1024   LDLCALC 74 06/09/2018 1046   LDLDIRECT 105.5 11/26/2010 0905    Physical Exam:    VS:  BP 128/70 (BP Location: Left Arm, Patient Position: Sitting, Cuff Size: Normal)   Pulse 71   Temp (!) 97.2 F (36.2 C)   Ht _0  (1.676 m)   Wt 187 lb (84.8 kg)   BMI 30.18 kg/m     Wt Readings from Last 3 Encounters:  05/27/19 187 lb (84.8 kg)  05/02/19 187 lb (84.8 kg)  04/13/19 183 lb (83 kg)     GEN: *Well nourished, well developed in no acute distress HEENT: Normal NECK: No JVD; No carotid bruits LYMPHATICS: No lymphadenopathy CARDIAC: RRR, no murmurs, rubs, gallops RESPIRATORY:  Clear to auscultation without rales, wheezing or rhonchi  ABDOMEN: Soft, non-tender, non-distended MUSCULOSKELETAL:  No edema; No deformity  SKIN: Warm and dry NEUROLOGIC:  Alert and oriented x 3 PSYCHIATRIC:  Normal affect   ASSESSMENT:    1. PAF (paroxysmal atrial fibrillation) (HCC)   2. Palpitations   3. Long term (current) use of anticoagulants   4. PSVT (paroxysmal supraventricular tachycardia) ([place])   5. Essential hypertension   6. Hypercholesterolemia    PLAN:    In order of problems listed above:  Paroxysmal atrial fibrillation Chronic anticoagulation Hx of SVT  Palpitations -Maintained on Toprol 50 mg twice daily -Coumadin - This patients CHA2DS2-VASc Score  and unadjusted Ischemic Stroke Rate (% per year) is equal to 4.8 % stroke rate/year from a score of 4 (2age, female, HTN) - I advised taking PRN lopressor if palpitations last longer than 30 min - she will also space out PRN lasix and PRN lopressor, if both are needed   Hypertension - pressure well-controlled - no medication changes   Hyperlipidemia 11/18/2018: Cholesterol 176; HDL 72.20; LDL Cholesterol 89; Triglycerides 75.0; VLDL 15.0 - continue statin   Follow up with Dr. Debara Pickett as scheduled.   Medication Adjustments/Labs and Tests Ordered: Current medicines are reviewed at length with the patient today.  Concerns regarding medicines are outlined above.  No orders of the defined types were placed in this encounter.  No orders of the defined types were placed in this encounter.   Signed, Ledora Bottcher, PA  05/27/2019 1:24 PM    [facility]

## 2019-05-27 ENCOUNTER — Encounter: Payer: Self-pay | Admitting: Physician Assistant

## 2019-05-27 ENCOUNTER — Other Ambulatory Visit: Payer: Self-pay

## 2019-05-27 ENCOUNTER — Ambulatory Visit: Payer: Medicare HMO | Admitting: Physician Assistant

## 2019-05-27 ENCOUNTER — Telehealth: Payer: Self-pay | Admitting: Physician Assistant

## 2019-05-27 VITALS — BP 128/70 | HR 71 | Temp 97.2°F | Ht 66.0 in | Wt 187.0 lb

## 2019-05-27 DIAGNOSIS — I471 Supraventricular tachycardia: Secondary | ICD-10-CM | POA: Diagnosis not present

## 2019-05-27 DIAGNOSIS — E7800 Pure hypercholesterolemia, unspecified: Secondary | ICD-10-CM

## 2019-05-27 DIAGNOSIS — Z7901 Long term (current) use of anticoagulants: Secondary | ICD-10-CM | POA: Diagnosis not present

## 2019-05-27 DIAGNOSIS — I10 Essential (primary) hypertension: Secondary | ICD-10-CM

## 2019-05-27 DIAGNOSIS — R002 Palpitations: Secondary | ICD-10-CM

## 2019-05-27 DIAGNOSIS — I480 Paroxysmal atrial fibrillation: Secondary | ICD-10-CM | POA: Diagnosis not present

## 2019-05-27 NOTE — Telephone Encounter (Signed)
Pt report daughter need to attend appointment due to memory impairment. Advise the daughter ok to attend.

## 2019-05-27 NOTE — Patient Instructions (Signed)
Medication Instructions:  Your physician recommends that you continue on your current medications as directed. Please refer to the Current Medication list given to you today.  *If you need a refill on your cardiac medications before your next appointment, please call your pharmacy*  Lab Work: NONE ordered at this time of appointment   If you have labs (blood work) drawn today and your tests are completely normal, you will receive your results only by: [name] MyChart Message (if you have MyChart) OR . A paper copy in the mail If you have any lab test that is abnormal or we need to change your treatment, we will call you to review the results.  Testing/Procedures: NONE ordered at this time of appointment   Follow-Up: At [facility], you and your health needs are our priority.  As part of our continuing mission to provide you with exceptional heart care, we have created designated Provider Care Teams.  These Care Teams include your primary Cardiologist (physician) and Advanced Practice Providers (APPs -  Physician Assistants and Nurse Practitioners) who all work together to provide you with the care you need, when you need it.  Your next appointment:   Keep scheduled appointment   The format for your next appointment:   In Person  Provider:   K. Mali Hilty, MD  Other Instructions

## 2019-05-27 NOTE — Telephone Encounter (Signed)
New Message:       Pt says she need her daughter to come in with her today.She says she is unsteady on her feet and have problem remembering,f

## 2019-06-04 ENCOUNTER — Other Ambulatory Visit: Payer: Self-pay | Admitting: Internal Medicine

## 2019-06-21 ENCOUNTER — Other Ambulatory Visit: Payer: Self-pay

## 2019-06-21 ENCOUNTER — Ambulatory Visit (INDEPENDENT_AMBULATORY_CARE_PROVIDER_SITE_OTHER): Payer: Medicare HMO | Admitting: Pharmacist Clinician (PhC)/ Clinical Pharmacy Specialist

## 2019-06-21 ENCOUNTER — Ambulatory Visit: Payer: Medicare HMO | Admitting: Internal Medicine

## 2019-06-21 DIAGNOSIS — Z7901 Long term (current) use of anticoagulants: Secondary | ICD-10-CM

## 2019-06-21 DIAGNOSIS — I480 Paroxysmal atrial fibrillation: Secondary | ICD-10-CM | POA: Diagnosis not present

## 2019-06-21 LAB — POCT INR: INR: 2.1 (ref 2.0–3.0)

## 2019-06-27 ENCOUNTER — Other Ambulatory Visit: Payer: Self-pay | Admitting: Internal Medicine

## 2019-06-27 ENCOUNTER — Other Ambulatory Visit: Payer: Medicare HMO

## 2019-06-27 DIAGNOSIS — I480 Paroxysmal atrial fibrillation: Secondary | ICD-10-CM

## 2019-07-05 ENCOUNTER — Other Ambulatory Visit: Payer: Self-pay | Admitting: Internal Medicine

## 2019-08-02 ENCOUNTER — Ambulatory Visit (INDEPENDENT_AMBULATORY_CARE_PROVIDER_SITE_OTHER): Payer: Medicare HMO | Admitting: Internal Medicine

## 2019-08-02 ENCOUNTER — Other Ambulatory Visit: Payer: Self-pay

## 2019-08-02 ENCOUNTER — Encounter: Payer: Self-pay | Admitting: Internal Medicine

## 2019-08-02 DIAGNOSIS — E559 Vitamin D deficiency, unspecified: Secondary | ICD-10-CM | POA: Diagnosis not present

## 2019-08-02 DIAGNOSIS — E785 Hyperlipidemia, unspecified: Secondary | ICD-10-CM

## 2019-08-02 DIAGNOSIS — I2583 Coronary atherosclerosis due to lipid rich plaque: Secondary | ICD-10-CM

## 2019-08-02 DIAGNOSIS — E119 Type 2 diabetes mellitus without complications: Secondary | ICD-10-CM

## 2019-08-02 DIAGNOSIS — I2510 Atherosclerotic heart disease of native coronary artery without angina pectoris: Secondary | ICD-10-CM | POA: Diagnosis not present

## 2019-08-02 LAB — BASIC METABOLIC PANEL
BUN: 13 mg/dL (ref 6–23)
CO2: 31 mEq/L (ref 19–32)
Calcium: 9.3 mg/dL (ref 8.4–10.5)
Chloride: 101 mEq/L (ref 96–112)
Creatinine, Ser: 1.08 mg/dL (ref 0.40–1.20)
GFR: 58.65 mL/min — ABNORMAL LOW (ref >60.00)
Glucose, Bld: 121 mg/dL — ABNORMAL HIGH (ref 70–99)
Potassium: 4.2 mEq/L (ref 3.5–5.1)
Sodium: 136 mEq/L (ref 135–145)

## 2019-08-02 LAB — HEMOGLOBIN A1C: Hgb A1c MFr Bld: 7.1 % — ABNORMAL HIGH (ref 4.6–6.5)

## 2019-08-02 MED ORDER — VITAMIN D3 50 MCG (2000 UT) PO CAPS: 2000.0000 [IU] | ORAL_CAPSULE | Freq: Every day | ORAL | 3 refills | Status: DC

## 2019-08-02 NOTE — Progress Notes (Signed)
Subjective:  Patient ID: [name], female    DOB: [birth year]  Age: 85 y.o. MRN: [id number]  CC: No chief complaint on file.   HPI [name] presents for Vit D def, anxiety, HTN f/u Lost wt on diet  Outpatient Medications Prior to Visit  Medication Sig Dispense Refill  . ACCU-CHEK FASTCLIX LANCETS MISC Use to check blood sugars twice a day Dx E11.9 102 each 2  . Alcohol Swabs (B-D SINGLE USE SWABS REGULAR) PADS Use to clean injection site to check blood sugars daily 300 each 3  . ALPRAZolam (XANAX) 0.5 MG tablet TAKE 1/2 TO 1 TABLET BY MOUTH THREE TIMES DAILY AS NEEDED FOR ANXIETY DO NOT TAKE WITH TRAMADOL. This is a 30 day supply 90 tablet 5  . amLODipine (NORVASC) 5 MG tablet Take 1 tablet (5 mg total) by mouth daily. 90 tablet 3  . aspirin 81 MG tablet Take 81 mg by mouth daily.      . Blood Glucose Calibration (ACCU-CHEK AVIVA) SOLN Use to calibrate BS monitor 3 each 3  . Blood Glucose Monitoring Suppl (ACCU-CHEK AVIVA PLUS) w/Device KIT Use to check blood sugars twice a day Dx E11.9 1 kit 0  . Blood Glucose Monitoring Suppl (ACCU-CHEK AVIVA PLUS) w/Device KIT Use to check blood sugars daily 1 kit 0  . clidinium-chlordiazePOXIDE (LIBRAX) 5-2.5 MG capsule TAKE ONE CAPSULE BY MOUTH THREE TIMES DAILY AS NEEDED FOR CRAMPS 20 capsule 2  . furosemide (LASIX) 20 MG tablet Take 1 tablet (20 mg total) by mouth daily as needed for edema. 30 tablet 3  . glucose blood (ACCU-CHEK AVIVA PLUS) test strip Use to check blood sugars twice a day 100 each 2  . glucose blood (ACCU-CHEK AVIVA PLUS) test strip Use to check blood sugars daily 300 each 3  . hydroxypropyl methylcellulose (ISOPTO TEARS) 2.5 % ophthalmic solution Place 1 drop into both eyes 2 (two) times daily.      . hydrOXYzine (ATARAX/VISTARIL) 25 MG tablet Take 1 tablet (25 mg total) by mouth every 8 (eight) hours as needed for itching. 60 tablet 0  . isosorbide mononitrate (IMDUR) 30 MG 24 hr tablet TAKE 1 TABLET BY MOUTH  EVERY DAY 30 tablet 11  . Lancets (ACCU-CHEK SOFT TOUCH) lancets Use as instructed 300 each 3  . metoprolol succinate (TOPROL-XL) 50 MG 24 hr tablet Take 50 mg by mouth 2 (two) times daily. Pt takes an extra half tablet if she is having palpitations.    . pantoprazole (PROTONIX) 40 MG tablet TAKE 1 TABLET BY MOUTH EVERY DAY 90 tablet 3  . simvastatin (ZOCOR) 20 MG tablet TAKE 1 TABLET BY MOUTH AT BEDTIME 90 tablet 3  . traMADol (ULTRAM) 50 MG tablet Take 1 tablet (50 mg total) by mouth 3 (three) times daily. 90 tablet 3  . triamcinolone cream (KENALOG) 0.1 % Apply 1 application topically 2 (two) times daily. 80 g 0  . warfarin (COUMADIN) 5 MG tablet TAKE 1 TABLET BY MOUTH EVERY DAY AS DIRECTED BY COUMADIN CLINIC 90 tablet 0  . Cholecalciferol (VITAMIN D3) 50 MCG (2000 UT) capsule TAKE ONE CAPSULE BY MOUTH EVERY DAY 100 capsule 3   No facility-administered medications prior to visit.    ROS: Review of Systems  Constitutional: Positive for unexpected weight change. Negative for activity change, appetite change, chills and fatigue.  HENT: Negative for congestion, mouth sores and sinus pressure.   Eyes: Negative for visual disturbance.  Respiratory: Negative for cough and chest tightness.  Gastrointestinal: Negative for abdominal pain and nausea.  Genitourinary: Negative for difficulty urinating, frequency and vaginal pain.  Musculoskeletal: Negative for back pain and gait problem.  Skin: Negative for pallor and rash.  Neurological: Negative for dizziness, tremors, weakness, numbness and headaches.  Psychiatric/Behavioral: Negative for confusion, sleep disturbance and suicidal ideas.    Objective:  BP 126/72 (BP Location: Left Arm, Patient Position: Sitting, Cuff Size: Large)   Pulse (!) 57   Temp 98.2 F (36.8 C) (Oral)   Ht '5\' 6"'$  (1.676 m)   Wt 182 lb (82.6 kg)   SpO2 98%   BMI 29.38 kg/m   BP Readings from Last 3 Encounters:  08/02/19 126/72  05/27/19 128/70  05/02/19 132/76      Wt Readings from Last 3 Encounters:  08/02/19 182 lb (82.6 kg)  05/27/19 187 lb (84.8 kg)  05/02/19 187 lb (84.8 kg)    Physical Exam Constitutional:      General: She is not in acute distress.    Appearance: She is well-developed.  HENT:     Head: Normocephalic.     Right Ear: External ear normal.     Left Ear: External ear normal.     Nose: Nose normal.  Eyes:     General:        Right eye: No discharge.        Left eye: No discharge.     Conjunctiva/sclera: Conjunctivae normal.     Pupils: Pupils are equal, round, and reactive to light.  Neck:     Thyroid: No thyromegaly.     Vascular: No JVD.     Trachea: No tracheal deviation.  Cardiovascular:     Rate and Rhythm: Normal rate and regular rhythm.     Heart sounds: Normal heart sounds.  Pulmonary:     Effort: No respiratory distress.     Breath sounds: No stridor. No wheezing.  Abdominal:     General: Bowel sounds are normal. There is no distension.     Palpations: Abdomen is soft. There is no mass.     Tenderness: There is no abdominal tenderness. There is no guarding or rebound.  Musculoskeletal:        General: No tenderness.     Cervical back: Normal range of motion and neck supple.  Lymphadenopathy:     Cervical: No cervical adenopathy.  Skin:    Findings: No erythema or rash.  Neurological:     Cranial Nerves: No cranial nerve deficit.     Motor: No abnormal muscle tone.     Coordination: Coordination normal.     Deep Tendon Reflexes: Reflexes normal.  Psychiatric:        Behavior: Behavior normal.        Thought Content: Thought content normal.        Judgment: Judgment normal.     Lab Results  Component Value Date   WBC 4.2 05/02/2019   HGB 12.0 05/02/2019   HCT 36.2 05/02/2019   PLT 210.0 05/02/2019   GLUCOSE 177 (H) 05/02/2019   CHOL 176 11/18/2018   TRIG 75.0 11/18/2018   HDL 72.20 11/18/2018   LDLDIRECT 105.5 11/26/2010   LDLCALC 89 11/18/2018   ALT 12 05/02/2019   AST 19  05/02/2019   NA 138 05/02/2019   K 3.6 05/02/2019   CL 102 05/02/2019   CREATININE 1.00 05/02/2019   BUN 13 05/02/2019   CO2 30 05/02/2019   TSH 1.00 05/02/2019   INR 2.1 06/21/2019   HGBA1C  7.4 (H) 05/02/2019    MM 3D SCREEN BREAST BILATERAL  Result Date: 03/23/2019 CLINICAL DATA:  Screening. EXAM: DIGITAL SCREENING BILATERAL MAMMOGRAM WITH TOMO AND CAD COMPARISON:  Previous exam(s). ACR Breast Density Category c: The breast tissue is heterogeneously dense, which may obscure small masses. FINDINGS: There are no findings suspicious for malignancy. Images were processed with CAD. IMPRESSION: No mammographic evidence of malignancy. A result letter of this screening mammogram will be mailed directly to the patient. RECOMMENDATION: Screening mammogram in one year. (Code:[id number]-B-01Y) BI-RADS CATEGORY  1: Negative. Electronically Signed   By: Fidela Salisbury M.D.   On: 03/23/2019 16:29    Assessment & Plan:   There are no diagnoses linked to this encounter.   Meds ordered this encounter  Medications  . Cholecalciferol (VITAMIN D3) 50 MCG (2000 UT) capsule    Sig: Take 1 capsule (2,000 Units total) by mouth daily.    Dispense:  100 capsule    Refill:  3     Follow-up: No follow-ups on file.  Walker Kehr, MD

## 2019-08-02 NOTE — Assessment & Plan Note (Signed)
Simvastatin, Toprol, Isosorbide, Amlodipine

## 2019-08-02 NOTE — Assessment & Plan Note (Signed)
  On diet  

## 2019-08-02 NOTE — Addendum Note (Signed)
Addended by: Cassandria Anger on: 08/02/2019 11:36 AM   Modules accepted: Orders

## 2019-08-02 NOTE — Addendum Note (Signed)
Addended by: Cresenciano Lick on: 08/02/2019 11:42 AM   Modules accepted: Orders

## 2019-08-02 NOTE — Assessment & Plan Note (Signed)
Simvastatin 

## 2019-08-02 NOTE — Assessment & Plan Note (Signed)
Vit D 

## 2019-08-03 ENCOUNTER — Ambulatory Visit (INDEPENDENT_AMBULATORY_CARE_PROVIDER_SITE_OTHER): Payer: Medicare HMO | Admitting: Pharmacist

## 2019-08-03 DIAGNOSIS — I480 Paroxysmal atrial fibrillation: Secondary | ICD-10-CM

## 2019-08-03 DIAGNOSIS — Z7901 Long term (current) use of anticoagulants: Secondary | ICD-10-CM

## 2019-08-03 LAB — POCT INR: INR: 2.3 (ref 2.0–3.0)

## 2019-08-23 ENCOUNTER — Other Ambulatory Visit: Payer: Self-pay | Admitting: Internal Medicine

## 2019-08-23 ENCOUNTER — Telehealth: Payer: Self-pay | Admitting: Internal Medicine

## 2019-08-23 NOTE — Telephone Encounter (Signed)
Spoke with patient. She reports she didn't sleep well last night because her stomach was upset. She woke up this morning and checked her blood pressure and it was 156/94, HR 64. She reports she took her medicine before she checked her blood pressure but wasn't sure how much time had elapsed in between. Patient has no complaints of dizziness, HA, slurred speech or blurred vision. Patient already on the schedule for tomorrow afternoon, no sooner appointment available. Staff will call patient back in an hour to check on blood pressure reading.

## 2019-08-23 NOTE — Telephone Encounter (Signed)
Pt c/o BP issue: STAT if pt c/o blurred vision, one-sided weakness or slurred speech  1. What are your last 5 BP readings?  156/94  HR 64  2. Are you having any other symptoms (ex. Dizziness, headache, blurred vision, passed out)?  PT did not sleep well, almost not at all   3. What is your BP issue? PT said her BP was a little high this morning. She wanted to come in and get checked out

## 2019-08-23 NOTE — Progress Notes (Signed)
Cardiology Office Note:    Date:  08/24/2019   ID:  [name], DOB [birth year], MRN [id number]  PCP:  Cassandria Anger, MD  Cardiologist:  Pixie Casino, MD   Referring MD: Cassandria Anger, MD   Chief Complaint  Patient presents with  . Follow-up    HTN    History of Present Illness:    [name] is a 85 y.o. female with a hx of paroxysmal atrial fibrillation and SVT, hypertension and hyperlipidemia.  Nonobstructive CAD by remote heart cath (?2012).  She is anticoagulated with Coumadin and controlled with beta-blocker.  She was seen in the ER on 09/12/2018 for racing heart rate found to have A. fib RVR.  This was easily treated with IV Lopressor with good response.  She wore heart monitor following this hospitalization which showed a low burden of A. Fib, with episodes of NSVT.  Recommended increasing beta-blocker.  Echo in 2018 revealed normal EF and mild diastolic dysfunction.  She was last seen by Dr. Debara Pickett on 12/14/2018.  She is maintained on metoprolol succinate 50 mg twice daily.  She takes an extra 25 mg for palpitations.  She is on as needed Lasix and Imdur. I saw her in clinic 05/27/19. She was doing well, but with many medication questions. She also reported palpitations but did not want to take PRN lopressor.   She called our office requesting an appt with elevated BP to 158/92. She brought her home BP cuff. 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 She also ate take out from [employer]. We discussed increased salt causing elevated BP. Her pressure is well controlled today. We reviewed ER precautions. If her BP is consistently elevated for 3 days OR she has symptoms she will call our office and/or go to the ER. She is on coumadin.   Past Medical History:  Diagnosis Date  . Anxiety state, unspecified   . Bronchitis, not specified as acute or chronic   .  Diaphragmatic hernia without mention of obstruction or gangrene   . Dyslipidemia   . Essential hypertension   . Irritable bowel syndrome   . Mitral valve disorders(424.0)    a. 08/2010 Echo: EF >55%, mild MR, mod TR, trace AI.  [name] Non-obstructive CAD    a. 07/2010 Lexiscan MV: no ischemia, EF 78%; b. 07/2010 Cath: LM nl, LAD50/60p, 40 apical, LCX nl, RCA dominant, 30/40p, 47m  . Osteoarthrosis, unspecified whether generalized or localized, unspecified site   . Osteoporosis, unspecified   . Other abnormal glucose   . PAF (paroxysmal atrial fibrillation) (HCC)    a. CHA2DS2VASc = 4-->coumadin.  .Marland KitchenPhlebitis and thrombophlebitis of superficial vessels of lower extremities   . PSVT (paroxysmal supraventricular tachycardia) ([place]    a. 05/2012 Holter: short bursts of PSVT noted-->Managed with toprol.  .Marland KitchenUnspecified venous (peripheral) insufficiency   . Unspecified vitamin D deficiency     Past Surgical History:  Procedure Laterality Date  . CARDIAC CATHETERIZATION  07/30/2010   nonobstructive CAD, 20-40% RCA stenosis, 50-60% eccentric LAD stenosis more prox, 40% LAD stenosis more distal, EF 65%  . TRANSTHORACIC ECHOCARDIOGRAM  09/03/2010   EF=>55%; mild MR; mod TR; AV mildly sclerotic with trace regurg; mild pulm valve regurg  . VESICOVAGINAL FISTULA CLOSURE W/ TAH      Current Medications: Current Meds  Medication Sig  . ACCU-CHEK FASTCLIX LANCETS  MISC Use to check blood sugars twice a day Dx E11.9  . Alcohol Swabs (B-D SINGLE USE SWABS REGULAR) PADS Use to clean injection site to check blood sugars daily  . ALPRAZolam (XANAX) 0.5 MG tablet TAKE 1/2 TO 1 TABLET BY MOUTH THREE TIMES DAILY AS NEEDED FOR ANXIETY. DO NOT TAKE WITH TRAMADOL.THIS IS A 30 DAY SUPPLY  . amLODipine (NORVASC) 5 MG tablet Take 1 tablet (5 mg total) by mouth daily.  [name] aspirin 81 MG tablet Take 81 mg by mouth daily.    . Blood Glucose Calibration (ACCU-CHEK AVIVA) SOLN Use to calibrate BS monitor  . Blood Glucose  Monitoring Suppl (ACCU-CHEK AVIVA PLUS) w/Device KIT Use to check blood sugars twice a day Dx E11.9  . Blood Glucose Monitoring Suppl (ACCU-CHEK AVIVA PLUS) w/Device KIT Use to check blood sugars daily  . Cholecalciferol (VITAMIN D3) 50 MCG (2000 UT) capsule Take 1 capsule (2,000 Units total) by mouth daily.  . clidinium-chlordiazePOXIDE (LIBRAX) 5-2.5 MG capsule TAKE ONE CAPSULE BY MOUTH THREE TIMES DAILY AS NEEDED FOR CRAMPS  . furosemide (LASIX) 20 MG tablet Take 1 tablet (20 mg total) by mouth daily as needed for edema.  [name] glucose blood (ACCU-CHEK AVIVA PLUS) test strip Use to check blood sugars twice a day  . glucose blood (ACCU-CHEK AVIVA PLUS) test strip Use to check blood sugars daily  . hydroxypropyl methylcellulose (ISOPTO TEARS) 2.5 % ophthalmic solution Place 1 drop into both eyes 2 (two) times daily.    . hydrOXYzine (ATARAX/VISTARIL) 25 MG tablet Take 1 tablet (25 mg total) by mouth every 8 (eight) hours as needed for itching.  . isosorbide mononitrate (IMDUR) 30 MG 24 hr tablet TAKE 1 TABLET BY MOUTH EVERY DAY  . Lancets (ACCU-CHEK SOFT TOUCH) lancets Use as instructed  . metoprolol succinate (TOPROL-XL) 50 MG 24 hr tablet Take 50 mg by mouth 2 (two) times daily. Pt takes an extra half tablet if she is having palpitations.  . pantoprazole (PROTONIX) 40 MG tablet TAKE 1 TABLET BY MOUTH EVERY DAY  . simvastatin (ZOCOR) 20 MG tablet TAKE 1 TABLET BY MOUTH AT BEDTIME  . traMADol (ULTRAM) 50 MG tablet Take 1 tablet (50 mg total) by mouth 3 (three) times daily.  [name] triamcinolone cream (KENALOG) 0.1 % Apply 1 application topically 2 (two) times daily.  [name] warfarin (COUMADIN) 5 MG tablet TAKE 1 TABLET BY MOUTH EVERY DAY AS DIRECTED BY COUMADIN CLINIC     Allergies:   Clarithromycin, Fosamax [alendronate sodium], Guaifenesin er, Metformin, and Tape   Social History   Socioeconomic History  . Marital status: Widowed    Spouse name: Not on file  . Number of children: 4  . Years of  education: Not on file  . Highest education level: Not on file  Occupational History  . Occupation: retired    Fish farm manager: RETIRED  Tobacco Use  . Smoking status: Never Smoker  . Smokeless tobacco: Never Used  Substance and Sexual Activity  . Alcohol use: No  . Drug use: No  . Sexual activity: Not Currently  Other Topics Concern  . Not on file  Social History Narrative  . Not on file   Social Determinants of Health   Financial Resource Strain:   . Difficulty of Paying Living Expenses:   Food Insecurity:   . Worried About Charity fundraiser in the Last Year:   . Arboriculturist in the Last Year:   Transportation Needs:   . Lack of Transportation (  Medical):   [name] Lack of Transportation (Non-Medical):   Physical Activity:   . Days of Exercise per Week:   . Minutes of Exercise per Session:   Stress:   . Feeling of Stress :   Social Connections:   . Frequency of Communication with Friends and Family:   . Frequency of Social Gatherings with Friends and Family:   . Attends Religious Services:   . Active Member of Clubs or Organizations:   . Attends Archivist Meetings:   [name] Marital Status:      Family History: The patient's family history includes Clotting disorder in her paternal grandmother; Stroke in her brother, father, and mother.  ROS:   Please see the history of present illness.     All other systems reviewed and are negative.  EKGs/Labs/Other Studies Reviewed:    The following studies were reviewed today:  Heart monitor 11/01/18: Short periods of atrial fibrillation as well as some NSVT, PAC's and PVC's  EKG:  EKG is  ordered today.  The ekg ordered today demonstrates sinus bradycardia HR 55, poor R wave progression - stable from prior  Recent Labs: 09/12/2018: Magnesium 2.0 05/02/2019: ALT 12; Hemoglobin 12.0; Platelets 210.0; TSH 1.00 08/02/2019: BUN 13; Creatinine, Ser 1.08; Potassium 4.2; Sodium 136  Recent Lipid Panel    Component Value Date/Time    CHOL 176 11/18/2018 1024   CHOL 160 06/09/2018 1046   TRIG 75.0 11/18/2018 1024   HDL 72.20 11/18/2018 1024   HDL 72 06/09/2018 1046   CHOLHDL 2 11/18/2018 1024   VLDL 15.0 11/18/2018 1024   LDLCALC 89 11/18/2018 1024   LDLCALC 74 06/09/2018 1046   LDLDIRECT 105.5 11/26/2010 0905    Physical Exam:    VS:  BP 120/68   Pulse (!) 55   Temp (!) 94.6 F (34.8 C)   Ht '5\' 6"'$  (1.676 m)   Wt 182 lb (82.6 kg)   SpO2 98%   BMI 29.38 kg/m     Wt Readings from Last 3 Encounters:  08/24/19 182 lb (82.6 kg)  08/02/19 182 lb (82.6 kg)  05/27/19 187 lb (84.8 kg)     GEN: elderly female in no acute distress HEENT: Normal NECK: No JVD; No carotid bruits LYMPHATICS: No lymphadenopathy CARDIAC: regular rhythm, bradycardic rate, no murmurs, rubs, gallops RESPIRATORY:  Clear to auscultation without rales, wheezing or rhonchi  ABDOMEN: Soft, non-tender, non-distended MUSCULOSKELETAL:  No edema; No deformity  SKIN: Warm and dry NEUROLOGIC:  Alert and oriented x 3 PSYCHIATRIC:  Normal affect   ASSESSMENT:    1. Essential hypertension   2. PAF (paroxysmal atrial fibrillation) ([place])   3. PSVT (paroxysmal supraventricular tachycardia) ([place])   4. Long term current use of anticoagulant therapy   5. Coronary artery disease due to lipid rich plaque   6. Sinus bradycardia   7. Dyslipidemia, goal LDL below 70    PLAN:    In order of problems listed above:  Hypertension - 50 mg lopressor BID, 5 mg norvasc  - pressure here is great - home BP machine calibrated to our machine/manual - we discussed limiting salt and that she may need to take PRN lasix the day following take out food - currently taking PRN lasix 2-3 times weekly   Paroxysmal atrial fibrillation Chronic anticoagulation Hx of SVT -Maintained on Toprol 50 mg twice daily -Coumadin - This patients CHA2DS2-VASc Score and unadjusted Ischemic Stroke Rate (% per year) is equal to 4.8 % stroke rate/year from a score of  4 (2age,  female, HTN) - PRN lopressor for palpitations - she denies palpitations   Sinus bradycardia - history of bradycardia - no medication changes   Hyperlipidemia 11/18/2018: Cholesterol 176; HDL 72.20; LDL Cholesterol 89; Triglycerides 75.0; VLDL 15.0  Follow up as scheduled.   Medication Adjustments/Labs and Tests Ordered: Current medicines are reviewed at length with the patient today.  Concerns regarding medicines are outlined above.  Orders Placed This Encounter  Procedures  . EKG 12-Lead   No orders of the defined types were placed in this encounter.   Signed, Ledora Bottcher, [place]  08/24/2019 5:41 PM    [facility]

## 2019-08-23 NOTE — Telephone Encounter (Signed)
Thanks .. does not sound like she needs an urgent office visit, but if it makes her feel better.   Dr Lemmie Evens

## 2019-08-23 NOTE — Telephone Encounter (Signed)
Spoke with patient. Repeat BP was 158/92. She has no symptoms. Patient does not check her blood pressure daily. She reports the other day her blood pressure was 130 something but she didn't write down the bottom number. Patient advised to decrease stress today, limit salt intake and to call back if symptoms develop. Patient will be seen tomorrow afternoon in office. Will route to MD as an FYI.

## 2019-08-24 ENCOUNTER — Encounter: Payer: Self-pay | Admitting: Physician Assistant

## 2019-08-24 ENCOUNTER — Ambulatory Visit: Payer: Medicare HMO | Admitting: Physician Assistant

## 2019-08-24 ENCOUNTER — Other Ambulatory Visit: Payer: Self-pay

## 2019-08-24 VITALS — BP 120/68 | HR 55 | Temp 94.6°F | Ht 66.0 in | Wt 182.0 lb

## 2019-08-24 DIAGNOSIS — I2583 Coronary atherosclerosis due to lipid rich plaque: Secondary | ICD-10-CM | POA: Diagnosis not present

## 2019-08-24 DIAGNOSIS — I480 Paroxysmal atrial fibrillation: Secondary | ICD-10-CM | POA: Diagnosis not present

## 2019-08-24 DIAGNOSIS — I2510 Atherosclerotic heart disease of native coronary artery without angina pectoris: Secondary | ICD-10-CM

## 2019-08-24 DIAGNOSIS — I471 Supraventricular tachycardia: Secondary | ICD-10-CM

## 2019-08-24 DIAGNOSIS — R001 Bradycardia, unspecified: Secondary | ICD-10-CM

## 2019-08-24 DIAGNOSIS — I10 Essential (primary) hypertension: Secondary | ICD-10-CM

## 2019-08-24 DIAGNOSIS — Z7901 Long term (current) use of anticoagulants: Secondary | ICD-10-CM

## 2019-08-24 DIAGNOSIS — E785 Hyperlipidemia, unspecified: Secondary | ICD-10-CM | POA: Diagnosis not present

## 2019-08-24 NOTE — Patient Instructions (Signed)
Medication Instructions:  Your physician recommends that you continue on your current medications as directed. Please refer to the Current Medication list given to you today.  *If you need a refill on your cardiac medications before your next appointment, please call your pharmacy*   Follow-Up: At [facility], you and your health needs are our priority.  As part of our continuing mission to provide you with exceptional heart care, we have created designated Provider Care Teams.  These Care Teams include your primary Cardiologist (physician) and Advanced Practice Providers (APPs -  Physician Assistants and Nurse Practitioners) who all work together to provide you with the care you need, when you need it.  We recommend signing up for the patient portal called "MyChart".  Sign up information is provided on this After Visit Summary.  MyChart is used to connect with patients for Virtual Visits (Telemedicine).  Patients are able to view lab/test results, encounter notes, upcoming appointments, etc.  Non-urgent messages can be sent to your provider as well.   To learn more about what you can do with MyChart, go to [email or web address].    Your next appointment:   4 month(s)  The format for your next appointment:   In Person  Provider:   K. Mali Hilty, MD

## 2019-09-01 ENCOUNTER — Other Ambulatory Visit: Payer: Self-pay

## 2019-09-01 ENCOUNTER — Ambulatory Visit
Admission: RE | Admit: 2019-09-01 | Discharge: 2019-09-01 | Disposition: A | Discharge status: Home / Self Care | Payer: Medicare HMO | Source: Ambulatory Visit | Attending: Urology | Admitting: Urology

## 2019-09-01 DIAGNOSIS — C642 Malignant neoplasm of left kidney, except renal pelvis: Secondary | ICD-10-CM | POA: Diagnosis not present

## 2019-09-01 DIAGNOSIS — D4101 Neoplasm of uncertain behavior of right kidney: Secondary | ICD-10-CM

## 2019-09-01 MED ORDER — GADOBENATE DIMEGLUMINE 529 MG/ML IV SOLN
17.0000 mL | Freq: Once | INTRAVENOUS | Status: AC | PRN
  Administered 2019-09-01: 17 mL via INTRAVENOUS

## 2019-09-13 ENCOUNTER — Ambulatory Visit (INDEPENDENT_AMBULATORY_CARE_PROVIDER_SITE_OTHER): Payer: Medicare HMO | Admitting: Pharmacist Clinician (PhC)/ Clinical Pharmacy Specialist

## 2019-09-13 ENCOUNTER — Other Ambulatory Visit: Payer: Self-pay

## 2019-09-13 DIAGNOSIS — I480 Paroxysmal atrial fibrillation: Secondary | ICD-10-CM | POA: Diagnosis not present

## 2019-09-13 DIAGNOSIS — Z7901 Long term (current) use of anticoagulants: Secondary | ICD-10-CM | POA: Diagnosis not present

## 2019-09-13 LAB — POCT INR: INR: 2.3 (ref 2.0–3.0)

## 2019-09-24 ENCOUNTER — Other Ambulatory Visit: Payer: Self-pay | Admitting: Internal Medicine

## 2019-09-24 DIAGNOSIS — I480 Paroxysmal atrial fibrillation: Secondary | ICD-10-CM

## 2019-10-15 ENCOUNTER — Other Ambulatory Visit: Payer: Self-pay | Admitting: Adult Health

## 2019-10-22 ENCOUNTER — Encounter (HOSPITAL_COMMUNITY): Payer: Self-pay

## 2019-10-22 ENCOUNTER — Ambulatory Visit (HOSPITAL_COMMUNITY)
Admission: EM | Admit: 2019-10-22 | Discharge: 2019-10-22 | Disposition: A | Discharge status: Home / Self Care | Payer: Medicare HMO

## 2019-10-22 ENCOUNTER — Other Ambulatory Visit: Payer: Self-pay

## 2019-10-22 ENCOUNTER — Ambulatory Visit (INDEPENDENT_AMBULATORY_CARE_PROVIDER_SITE_OTHER): Payer: Medicare HMO

## 2019-10-22 DIAGNOSIS — R233 Spontaneous ecchymoses: Secondary | ICD-10-CM | POA: Diagnosis not present

## 2019-10-22 DIAGNOSIS — R609 Edema, unspecified: Secondary | ICD-10-CM | POA: Diagnosis not present

## 2019-10-22 DIAGNOSIS — M79672 Pain in left foot: Secondary | ICD-10-CM

## 2019-10-22 DIAGNOSIS — R600 Localized edema: Secondary | ICD-10-CM | POA: Diagnosis not present

## 2019-10-22 NOTE — ED Triage Notes (Signed)
Pt c/o pain, swelling to left foot, and left 2nd, 3rd, 4th, toes.  Pt states she can't remember an injury/trauma to area. Denies dizziness, recent change in LOC, syncope or fall.  Left dorsal aspect of foot/metatarsal and 2nd, 3rd, 4th toes edematous, ecchymoses, noted. Cap refill brisk, +2 DP pulse, foot/toes warm.

## 2019-10-22 NOTE — Discharge Instructions (Signed)
Xray normal Bruising may be from coumadin Elevate, ice Tylenol [phone] mg every 4-6 hours Follow up if not resolving over the next week

## 2019-10-23 NOTE — ED Provider Notes (Signed)
[place]    CSN: [id number] Arrival date & time: 10/22/19  1543      History   Chief Complaint Chief Complaint  Patient presents with  . Foot Pain    HPI [name] is a 85 y.o. female history of hypertension, paroxysmal A. fib on Coumadin, CAD, venous insufficiency presenting today for evaluation of left foot pain and bruising.  Patient reports over the past 2 to 3 days she has had pain in her left foot as well as associated bruising to her second through fourth toes.  She cannot recall any specific injury or trauma to her foot.  She is on Coumadin, last INR was within goal range of 2-3.  Has plans to have INR checked this upcoming week.  Denies recent dose changing.  She denies any dizziness fall or recent loss of consciousness that may have caused injury.  She reports she has tramadol prescribed to her which she has not taken  HPI  Past Medical History:  Diagnosis Date  . Anxiety state, unspecified   . Bronchitis, not specified as acute or chronic   . Diaphragmatic hernia without mention of obstruction or gangrene   . Dyslipidemia   . Essential hypertension   . Irritable bowel syndrome   . Mitral valve disorders(424.0)    a. 08/2010 Echo: EF >55%, mild MR, mod TR, trace AI.  [name] Non-obstructive CAD    a. 07/2010 Lexiscan MV: no ischemia, EF 78%; b. 07/2010 Cath: LM nl, LAD50/60p, 40 apical, LCX nl, RCA dominant, 30/40p, 71m  . Osteoarthrosis, unspecified whether generalized or localized, unspecified site   . Osteoporosis, unspecified   . Other abnormal glucose   . PAF (paroxysmal atrial fibrillation) (HCC)    a. CHA2DS2VASc = 4-->coumadin.  .Marland KitchenPhlebitis and thrombophlebitis of superficial vessels of lower extremities   . PSVT (paroxysmal supraventricular tachycardia) ([place]    a. 05/2012 Holter: short bursts of PSVT noted-->Managed with toprol.  .Marland KitchenUnspecified venous (peripheral) insufficiency   . Unspecified vitamin D deficiency     Patient Active Problem  List   Diagnosis Date Noted  . Sinusitis 04/13/2019  . Rash 12/05/2016  . Dyspnea 04/24/2016  . Tricuspid valve insufficiency 04/24/2016  . Cerumen impaction 11/19/2015  . Ear discomfort 11/13/2015  . Grief at loss of child 09/14/2015  . Well adult exam 09/14/2015  . Dyslipidemia   . PSVT (paroxysmal supraventricular tachycardia) ([place]   . PAF (paroxysmal atrial fibrillation) ([place]   . Viral gastroenteritis 07/21/2015  . Acute upper respiratory infection 04/17/2015  . Edema 11/08/2014  . Chronic venous insufficiency 11/08/2014  . Diarrhea 05/01/2014  . Mass of left thigh 12/14/2013  . Microhematuria 12/14/2013  . Long term (current) use of anticoagulants 08/19/2013  . CAD (coronary artery disease) 05/21/2011  . Atrial tachycardia ([place] 11/19/2010  . CHEST PAIN 08/07/2009  . Vitamin D deficiency 04/19/2008  . SUPERFICIAL THROMBOPHLEBITIS 10/20/2007  . DM2 (diabetes mellitus, type 2) ([place] 03/16/2007  . HYPERCHOLESTEROLEMIA 03/15/2007  . Anxiety 03/15/2007  . Essential hypertension 03/15/2007  . Mitral valve disorder 03/15/2007  . BRONCHITIS 03/15/2007  . HIATAL HERNIA 03/15/2007  . Irritable bowel syndrome 03/15/2007  . Osteoarthritis 03/15/2007  . Osteoporosis 03/15/2007    Past Surgical History:  Procedure Laterality Date  . CARDIAC CATHETERIZATION  07/30/2010   nonobstructive CAD, 20-40% RCA stenosis, 50-60% eccentric LAD stenosis more prox, 40% LAD stenosis more distal, EF 65%  . TRANSTHORACIC ECHOCARDIOGRAM  09/03/2010   EF=>55%; mild MR; mod TR; AV mildly  sclerotic with trace regurg; mild pulm valve regurg  . VESICOVAGINAL FISTULA CLOSURE W/ TAH      OB History   No obstetric history on file.      Home Medications    Prior to Admission medications   Medication Sig Start Date End Date Taking? Authorizing Provider  ALPRAZolam (XANAX) 0.5 MG tablet TAKE 1/2 TO 1 TABLET BY MOUTH THREE TIMES DAILY AS NEEDED FOR ANXIETY. DO NOT TAKE WITH TRAMADOL.THIS IS A 30 DAY  SUPPLY 08/24/19  Yes Plotnikov, Evie Lacks, MD  amLODipine (NORVASC) 5 MG tablet Take 1 tablet (5 mg total) by mouth daily. 06/28/19  Yes Plotnikov, Evie Lacks, MD  aspirin 81 MG tablet Take 81 mg by mouth daily.     Yes [provider]  Cholecalciferol (VITAMIN D3) 50 MCG (2000 UT) capsule Take 1 capsule (2,000 Units total) by mouth daily. 08/02/19  Yes Plotnikov, Evie Lacks, MD  clidinium-chlordiazePOXIDE (LIBRAX) 5-2.5 MG capsule TAKE ONE CAPSULE BY MOUTH THREE TIMES DAILY AS NEEDED FOR CRAMPS 05/02/19  Yes Plotnikov, Evie Lacks, MD  furosemide (LASIX) 20 MG tablet Take 1 tablet (20 mg total) by mouth daily as needed for edema. 10/29/17  Yes Plotnikov, Evie Lacks, MD  isosorbide mononitrate (IMDUR) 30 MG 24 hr tablet TAKE 1 TABLET BY MOUTH EVERY DAY 07/05/19  Yes Hilty, Nadean Corwin, MD  simvastatin (ZOCOR) 20 MG tablet TAKE 1 TABLET BY MOUTH AT BEDTIME 03/07/19  Yes Plotnikov, Evie Lacks, MD  warfarin (COUMADIN) 5 MG tablet TAKE 1 TABLET BY MOUTH EVERY DAY AS DIRECTED BY COUMADIN CLINIC 09/26/19  Yes Hilty, Nadean Corwin, MD  ACCU-CHEK FASTCLIX LANCETS MISC Use to check blood sugars twice a day Dx E11.9 07/18/15   Plotnikov, Evie Lacks, MD  Alcohol Swabs (B-D SINGLE USE SWABS REGULAR) PADS Use to clean injection site to check blood sugars daily 05/16/19   Plotnikov, Evie Lacks, MD  Blood Glucose Calibration (ACCU-CHEK AVIVA) SOLN Use to calibrate BS monitor 05/16/19   Plotnikov, Evie Lacks, MD  Blood Glucose Monitoring Suppl (ACCU-CHEK AVIVA PLUS) w/Device KIT Use to check blood sugars twice a day Dx E11.9 04/02/16   Plotnikov, Evie Lacks, MD  Blood Glucose Monitoring Suppl (ACCU-CHEK AVIVA PLUS) w/Device KIT Use to check blood sugars daily 05/16/19   Plotnikov, Evie Lacks, MD  diazepam (VALIUM) 5 MG tablet Take by mouth. 08/31/19   [provider]  glucose blood (ACCU-CHEK AVIVA PLUS) test strip Use to check blood sugars twice a day 07/15/18   Plotnikov, Evie Lacks, MD  glucose blood (ACCU-CHEK AVIVA PLUS) test  strip Use to check blood sugars daily 05/16/19   Plotnikov, Evie Lacks, MD  hydroxypropyl methylcellulose (ISOPTO TEARS) 2.5 % ophthalmic solution Place 1 drop into both eyes 2 (two) times daily.      [provider]  hydrOXYzine (ATARAX/VISTARIL) 25 MG tablet Take 1 tablet (25 mg total) by mouth every 8 (eight) hours as needed for itching. 01/19/19   Plotnikov, Evie Lacks, MD  Lancets (ACCU-CHEK SOFT TOUCH) lancets Use as instructed 05/16/19   Plotnikov, Evie Lacks, MD  metoprolol succinate (TOPROL-XL) 50 MG 24 hr tablet TAKE 1 TABLET(50 MG) BY MOUTH TWICE DAILY 10/17/19   Hilty, Nadean Corwin, MD  pantoprazole (PROTONIX) 40 MG tablet TAKE 1 TABLET BY MOUTH EVERY DAY 06/06/19   Hilty, Nadean Corwin, MD  traMADol (ULTRAM) 50 MG tablet Take 1 tablet (50 mg total) by mouth 3 (three) times daily. 09/21/18   Plotnikov, Evie Lacks, MD  triamcinolone cream (KENALOG) 0.1 %  Apply 1 application topically 2 (two) times daily. 01/19/19   Plotnikov, Evie Lacks, MD    Family History Family History  Problem Relation Age of Onset  . Stroke Mother   . Stroke Father        also heart disease  . Clotting disorder Paternal Grandmother        blood clot  . Stroke Brother        also heart disease    Social History Social History   Tobacco Use  . Smoking status: Never Smoker  . Smokeless tobacco: Never Used  Vaping Use  . Vaping Use: Never used  Substance Use Topics  . Alcohol use: No  . Drug use: No     Allergies   Clarithromycin, Fosamax [alendronate sodium], Guaifenesin er, Metformin, and Tape   Review of Systems Review of Systems  Constitutional: Negative for fatigue and fever.  Eyes: Negative for visual disturbance.  Respiratory: Negative for shortness of breath.   Cardiovascular: Negative for chest pain.  Gastrointestinal: Negative for abdominal pain, nausea and vomiting.  Musculoskeletal: Positive for arthralgias, gait problem and joint swelling.  Skin: Positive for color change. Negative for rash  and wound.  Neurological: Negative for dizziness, weakness, light-headedness and headaches.     Physical Exam Triage Vital Signs ED Triage Vitals  Enc Vitals Group     BP 10/22/19 1643 132/81     Pulse Rate 10/22/19 1643 64     Resp 10/22/19 1643 19     Temp 10/22/19 1643 98.5 F (36.9 C)     Temp Source 10/22/19 1643 Oral     SpO2 10/22/19 1643 97 %     Weight --      Height --      Head Circumference --      Peak Flow --      Pain Score 10/22/19 1641 6     Pain Loc --      Pain Edu? --      Excl. in [place]? --    No data found.  Updated Vital Signs BP 132/81 (BP Location: Right Arm)   Pulse 64   Temp 98.5 F (36.9 C) (Oral)   Resp 19   SpO2 97%   Visual Acuity Right Eye Distance:   Left Eye Distance:   Bilateral Distance:    Right Eye Near:   Left Eye Near:    Bilateral Near:     Physical Exam Vitals and nursing note reviewed.  Constitutional:      Appearance: She is well-developed.     Comments: No acute distress  HENT:     Head: Normocephalic and atraumatic.     Nose: Nose normal.  Eyes:     Conjunctiva/sclera: Conjunctivae normal.  Cardiovascular:     Rate and Rhythm: Normal rate.  Pulmonary:     Effort: Pulmonary effort is normal. No respiratory distress.  Abdominal:     General: There is no distension.  Musculoskeletal:        General: Normal range of motion.     Cervical back: Neck supple.     Comments: Second through fourth toes with purple ecchymosis discoloration and mild swelling extends just over base of toes that MTP joints, no bruising or discoloration throughout dorsum of foot, dorsalis pedis 2+, cap refill less than 2 seconds, tenderness to palpation over dorsum of foot, nontender to palpation along ankle/malleoli  No calf swelling tenderness or erythema  Skin:    General: Skin is warm and dry.  Neurological:     Mental Status: She is alert and oriented to person, place, and time.      UC Treatments / Results  Labs (all labs  ordered are listed, but only abnormal results are displayed) Labs Reviewed - No data to display  EKG   Radiology DG Foot Complete Left  Result Date: 10/22/2019 CLINICAL DATA:  Edema and ecchymosis.  Pain for 3 days.  No injury. EXAM: LEFT FOOT - COMPLETE 3+ VIEW COMPARISON:  None. FINDINGS: There is no evidence of fracture or dislocation. There is no evidence of arthropathy or other focal bone abnormality. Soft tissues are unremarkable. IMPRESSION: Negative. Electronically Signed   By: Dorise Bullion III M.D   On: 10/22/2019 17:22    Procedures Procedures (including critical care time)  Medications Ordered in UC Medications - No data to display  Initial Impression / Assessment and Plan / UC Course  I have reviewed the triage vital signs and the nursing notes.  Pertinent labs & imaging results that were available during my care of the patient were reviewed by me and considered in my medical decision making (see chart for details).     X-ray negative for acute fracture, possible contusion/ecchymosis from being on Coumadin with easy bleeding.  No sign of cellulitis or gout at this time.  Recommending icing elevation and Tylenol, may use tramadol for severe pain and monitor over the next 4 to 5 days for gradual healing.  Discussed strict return precautions. Patient verbalized understanding and is agreeable with plan.  Final Clinical Impressions(s) / UC Diagnoses   Final diagnoses:  Foot pain, left     Discharge Instructions     Xray normal Bruising may be from coumadin Elevate, ice Tylenol [phone] mg every 4-6 hours Follow up if not resolving over the next week   ED Prescriptions    None     I have reviewed the PDMP during this encounter.   Janith Lima, PA-C 10/23/19 1204

## 2019-10-25 ENCOUNTER — Other Ambulatory Visit: Payer: Self-pay

## 2019-10-25 ENCOUNTER — Ambulatory Visit (INDEPENDENT_AMBULATORY_CARE_PROVIDER_SITE_OTHER): Payer: Medicare HMO | Admitting: Pharmacist Clinician (PhC)/ Clinical Pharmacy Specialist

## 2019-10-25 ENCOUNTER — Other Ambulatory Visit: Payer: Self-pay | Admitting: Internal Medicine

## 2019-10-25 DIAGNOSIS — Z7901 Long term (current) use of anticoagulants: Secondary | ICD-10-CM | POA: Diagnosis not present

## 2019-10-25 DIAGNOSIS — I480 Paroxysmal atrial fibrillation: Secondary | ICD-10-CM | POA: Diagnosis not present

## 2019-10-25 LAB — POCT INR: INR: 2.3 (ref 2.0–3.0)

## 2019-10-27 ENCOUNTER — Ambulatory Visit (INDEPENDENT_AMBULATORY_CARE_PROVIDER_SITE_OTHER): Payer: Medicare HMO | Admitting: Internal Medicine

## 2019-10-27 ENCOUNTER — Other Ambulatory Visit: Payer: Self-pay

## 2019-10-27 ENCOUNTER — Encounter: Payer: Self-pay | Admitting: Internal Medicine

## 2019-10-27 VITALS — BP 150/70 | HR 66 | Temp 97.7°F | Resp 16 | Ht 66.0 in | Wt 182.0 lb

## 2019-10-27 DIAGNOSIS — M66272 Spontaneous rupture of extensor tendons, left ankle and foot: Secondary | ICD-10-CM | POA: Diagnosis not present

## 2019-10-27 DIAGNOSIS — S9032XA Contusion of left foot, initial encounter: Secondary | ICD-10-CM

## 2019-10-27 MED ORDER — RELAFEN DS 1000 MG PO TABS: 1.0000 | ORAL_TABLET | Freq: Every day | ORAL | 0 refills | Status: DC | PRN

## 2019-10-27 NOTE — Patient Instructions (Signed)
Contusion A contusion is a deep bruise. Contusions are the result of a blunt injury to tissues and muscle fibers under the skin. The injury causes bleeding under the skin. The skin overlying the contusion may turn blue, purple, or yellow. Minor injuries will give you a painless contusion, but more severe injuries cause contusions that may stay painful and swollen for a few weeks. Follow these instructions at home: Pay attention to any changes in your symptoms. Let your health care provider know about them. Take these actions to relieve your pain. Managing pain, stiffness, and swelling   Use resting, icing, applying pressure (compression), and raising (elevating) the injured area. This is often called the RICE strategy. ? Rest the injured area. Return to your normal activities as told by your health care provider. Ask your health care provider what activities are safe for you. ? If directed, put ice on the injured area:  Put ice in a plastic bag.  Place a towel between your skin and the bag.  Leave the ice on for 20 minutes, 2-3 times per day. ? If directed, apply light compression to the injured area using an elastic bandage. Make sure the bandage is not wrapped too tightly. Remove and reapply the bandage as directed by your health care provider. ? If possible, raise (elevate) the injured area above the level of your heart while you are sitting or lying down. General instructions  Take over-the-counter and prescription medicines only as told by your health care provider.  Keep all follow-up visits as told by your health care provider. This is important. Contact a health care provider if:  Your symptoms do not improve after several days of treatment.  Your symptoms get worse.  You have difficulty moving the injured area. Get help right away if:  You have severe pain.  You have numbness in a hand or foot.  Your hand or foot turns pale or cold. Summary  A contusion is a deep  bruise.  Contusions are the result of a blunt injury to tissues and muscle fibers under the skin.  It is treated with rest, ice, compression, and elevation. You may be given over-the-counter medicines for pain.  Contact a health care provider if your symptoms do not improve, or get worse.  Get help right away if you have severe pain, have numbness, or the area turns pale or cold. This information is not intended to replace advice given to you by your health care provider. Make sure you discuss any questions you have with your health care provider. Document Revised: 11/19/2017 Document Reviewed: 11/19/2017 Elsevier Patient Education  2020 [employer] Inc[employer]   

## 2019-10-27 NOTE — Progress Notes (Signed)
Subjective:  Patient ID: [name], female    DOB: [birth year]  Age: 85 y.o. MRN: [id number]  CC: Foot Pain  This visit occurred during the SARS-CoV-2 public health emergency.  Safety protocols were in place, including screening questions prior to the visit, additional usage of staff PPE, and extensive cleaning of exam room while observing appropriate contact time as indicated for disinfecting solutions.    HPI PRESCIOUS HURLESS presents for a 1 week history of pain and swelling in her left foot.  She is not a good historian.  She was seen a few days ago in an urgent care center and plain x-ray was normal.  She said she is taking a pain pill for the discomfort but is not helping much.  She has noticed bruising and swelling over her toes.  She cannot recall a specific trauma or injury.  She is concerned because there is a prominence on the dorsum of the foot.  Outpatient Medications Prior to Visit  Medication Sig Dispense Refill   ACCU-CHEK FASTCLIX LANCETS MISC Use to check blood sugars twice a day Dx E11.9 102 each 2   Alcohol Swabs (B-D SINGLE USE SWABS REGULAR) PADS Use to clean injection site to check blood sugars daily 300 each 3   ALPRAZolam (XANAX) 0.5 MG tablet TAKE 1/2 TO 1 TABLET BY MOUTH THREE TIMES DAILY AS NEEDED FOR ANXIETY. DO NOT TAKE WITH TRAMADOL.THIS IS A 30 DAY SUPPLY 90 tablet 1   amLODipine (NORVASC) 5 MG tablet Take 1 tablet (5 mg total) by mouth daily. 90 tablet 3   aspirin 81 MG tablet Take 81 mg by mouth daily.       Blood Glucose Calibration (ACCU-CHEK AVIVA) SOLN Use to calibrate BS monitor 3 each 3   Blood Glucose Monitoring Suppl (ACCU-CHEK AVIVA PLUS) w/Device KIT Use to check blood sugars twice a day Dx E11.9 1 kit 0   Blood Glucose Monitoring Suppl (ACCU-CHEK AVIVA PLUS) w/Device KIT Use to check blood sugars daily 1 kit 0   Cholecalciferol (VITAMIN D3) 50 MCG (2000 UT) capsule Take 1 capsule (2,000 Units total) by mouth daily. 100 capsule 3     clidinium-chlordiazePOXIDE (LIBRAX) 5-2.5 MG capsule TAKE ONE CAPSULE BY MOUTH THREE TIMES DAILY AS NEEDED FOR CRAMPS 20 capsule 2   diazepam (VALIUM) 5 MG tablet Take by mouth.     furosemide (LASIX) 20 MG tablet TAKE 1 TABLET(20 MG) BY MOUTH DAILY AS NEEDED FOR SWELLING 30 tablet 3   glucose blood (ACCU-CHEK AVIVA PLUS) test strip Use to check blood sugars twice a day 100 each 2   glucose blood (ACCU-CHEK AVIVA PLUS) test strip Use to check blood sugars daily 300 each 3   hydroxypropyl methylcellulose (ISOPTO TEARS) 2.5 % ophthalmic solution Place 1 drop into both eyes 2 (two) times daily.       hydrOXYzine (ATARAX/VISTARIL) 25 MG tablet Take 1 tablet (25 mg total) by mouth every 8 (eight) hours as needed for itching. 60 tablet 0   isosorbide mononitrate (IMDUR) 30 MG 24 hr tablet TAKE 1 TABLET BY MOUTH EVERY DAY 30 tablet 11   Lancets (ACCU-CHEK SOFT TOUCH) lancets Use as instructed 300 each 3   metoprolol succinate (TOPROL-XL) 50 MG 24 hr tablet TAKE 1 TABLET(50 MG) BY MOUTH TWICE DAILY 180 tablet 1   pantoprazole (PROTONIX) 40 MG tablet TAKE 1 TABLET BY MOUTH EVERY DAY 90 tablet 3   simvastatin (ZOCOR) 20 MG tablet TAKE 1 TABLET BY MOUTH AT  BEDTIME 90 tablet 3   traMADol (ULTRAM) 50 MG tablet Take 1 tablet (50 mg total) by mouth 3 (three) times daily. 90 tablet 3   triamcinolone cream (KENALOG) 0.1 % Apply 1 application topically 2 (two) times daily. 80 g 0   warfarin (COUMADIN) 5 MG tablet TAKE 1 TABLET BY MOUTH EVERY DAY AS DIRECTED BY COUMADIN CLINIC 90 tablet 0   No facility-administered medications prior to visit.    ROS Review of Systems  Constitutional: Negative for chills, fatigue and fever.  HENT: Negative.   Eyes: Negative.   Respiratory: Negative for cough, chest tightness, shortness of breath and wheezing.   Cardiovascular: Negative for chest pain, palpitations and leg swelling.  Gastrointestinal: Negative for abdominal pain, diarrhea and nausea.   Endocrine: Negative.   Genitourinary: Negative.  Negative for difficulty urinating.  Musculoskeletal: Positive for arthralgias. Negative for back pain and neck pain.  Skin: Negative.   Neurological: Negative.  Negative for dizziness and weakness.  Hematological: Negative for adenopathy. Does not bruise/bleed easily.  Psychiatric/Behavioral: Positive for confusion and decreased concentration.    Objective:  BP (!) 150/70 (BP Location: Left Arm, Patient Position: Sitting, Cuff Size: Normal)    Pulse 66    Temp 97.7 F (36.5 C) (Oral)    Resp 16    Ht _0  (1.676 m)    Wt 182 lb (82.6 kg)    SpO2 96%    BMI 29.38 kg/m   BP Readings from Last 3 Encounters:  10/27/19 (!) 150/70  10/22/19 132/81  08/24/19 120/68    Wt Readings from Last 3 Encounters:  10/27/19 182 lb (82.6 kg)  08/24/19 182 lb (82.6 kg)  08/02/19 182 lb (82.6 kg)    Physical Exam Vitals reviewed.  Constitutional:      Appearance: Normal appearance.  HENT:     Nose: Nose normal.  Eyes:     General: No scleral icterus.    Conjunctiva/sclera: Conjunctivae normal.  Cardiovascular:     Rate and Rhythm: Normal rate and regular rhythm.     Pulses:          Dorsalis pedis pulses are 1+ on the right side and 1+ on the left side.       Posterior tibial pulses are 1+ on the right side and 1+ on the left side.     Heart sounds: No murmur heard.   Pulmonary:     Effort: Pulmonary effort is normal.     Breath sounds: No stridor. No wheezing, rhonchi or rales.  Abdominal:     General: Abdomen is flat.  Musculoskeletal:        General: Tenderness and deformity present.     Cervical back: Neck supple.     Right lower leg: No edema.     Left lower leg: No edema.     Left foot: Normal range of motion. Deformity present.  Feet:     Right foot:     Skin integrity: Skin integrity normal.     Left foot:     Skin integrity: Skin integrity normal.     Comments: There is diffuse ecchymosis, swelling, and tenderness to  palpation on the dorsum of the second, third, and fourth toes.  Over the dorsum of the left forefoot there appears to be a rupture in the extensor tendon over the fourth metatarsal.  See photo. Lymphadenopathy:     Cervical: No cervical adenopathy.  Neurological:     Mental Status: She is alert.  Lab Results  Component Value Date   WBC 4.2 05/02/2019   HGB 12.0 05/02/2019   HCT 36.2 05/02/2019   PLT 210.0 05/02/2019   GLUCOSE 121 (H) 08/02/2019   CHOL 176 11/18/2018   TRIG 75.0 11/18/2018   HDL 72.20 11/18/2018   LDLDIRECT 105.5 11/26/2010   LDLCALC 89 11/18/2018   ALT 12 05/02/2019   AST 19 05/02/2019   NA 136 08/02/2019   K 4.2 08/02/2019   CL 101 08/02/2019   CREATININE 1.08 08/02/2019   BUN 13 08/02/2019   CO2 31 08/02/2019   TSH 1.00 05/02/2019   INR 2.3 10/25/2019   HGBA1C 7.1 (H) 08/02/2019    DG Foot Complete Left  Result Date: 10/22/2019 CLINICAL DATA:  Edema and ecchymosis.  Pain for 3 days.  No injury. EXAM: LEFT FOOT - COMPLETE 3+ VIEW COMPARISON:  None. FINDINGS: There is no evidence of fracture or dislocation. There is no evidence of arthropathy or other focal bone abnormality. Soft tissues are unremarkable. IMPRESSION: Negative. Electronically Signed   By: Dorise Bullion III M.D   On: 10/22/2019 17:22    Assessment & Plan:   Siyona was seen today for foot pain.  Diagnoses and all orders for this visit:  Contusion of left foot, initial encounter- I recommended that she take nabumetone for just a few days to relieve the discomfort.  She was encouraged to rest, ice, and elevate her left foot.  I am concerned about the tendon rupture and so is she.  I recommended that she see an orthopedist. -     Nabumetone (RELAFEN DS) 1000 MG TABS; Take 1 tablet by mouth daily as needed.  Spontaneous rupture of extensor tendon of left foot -     Ambulatory referral to Orthopedic Surgery   I am having Elsie Ra. Ober start on Relafen DS. I am also having her  maintain her aspirin, hydroxypropyl methylcellulose / hypromellose, Accu-Chek FastClix Lancets, Accu-Chek Aviva Plus, glucose blood, traMADol, triamcinolone cream, hydrOXYzine, simvastatin, clidinium-chlordiazePOXIDE, Accu-Chek Aviva Plus, Accu-Chek Aviva Plus, accu-chek soft touch, Accu-Chek Aviva, B-D SINGLE USE SWABS REGULAR, pantoprazole, amLODipine, isosorbide mononitrate, Vitamin D3, ALPRAZolam, warfarin, metoprolol succinate, diazepam, and furosemide.  Meds ordered this encounter  Medications   Nabumetone (RELAFEN DS) 1000 MG TABS    Sig: Take 1 tablet by mouth daily as needed.    Dispense:  6 tablet    Refill:  0     Follow-up: Return if symptoms worsen or fail to improve.  Scarlette Calico, MD

## 2019-10-28 DIAGNOSIS — M66272 Spontaneous rupture of extensor tendons, left ankle and foot: Secondary | ICD-10-CM | POA: Insufficient documentation

## 2019-10-30 ENCOUNTER — Emergency Department (HOSPITAL_COMMUNITY): Payer: Medicare HMO

## 2019-10-30 ENCOUNTER — Other Ambulatory Visit: Payer: Self-pay

## 2019-10-30 ENCOUNTER — Encounter (HOSPITAL_COMMUNITY): Payer: Self-pay | Admitting: Emergency Medicine

## 2019-10-30 ENCOUNTER — Emergency Department (HOSPITAL_COMMUNITY)
Admission: EM | Admit: 2019-10-30 | Discharge: 2019-10-30 | Disposition: A | Discharge status: Home / Self Care | Payer: Medicare HMO | Attending: Emergency Medicine | Admitting: Emergency Medicine

## 2019-10-30 DIAGNOSIS — Z79899 Other long term (current) drug therapy: Secondary | ICD-10-CM | POA: Diagnosis not present

## 2019-10-30 DIAGNOSIS — I10 Essential (primary) hypertension: Secondary | ICD-10-CM

## 2019-10-30 DIAGNOSIS — M7989 Other specified soft tissue disorders: Secondary | ICD-10-CM | POA: Diagnosis not present

## 2019-10-30 LAB — URINALYSIS, ROUTINE W REFLEX MICROSCOPIC
Bacteria, UA: NONE SEEN
Bilirubin Urine: NEGATIVE
Glucose, UA: NEGATIVE mg/dL
Ketones, ur: NEGATIVE mg/dL
Leukocytes,Ua: NEGATIVE
Nitrite: NEGATIVE
Protein, ur: NEGATIVE mg/dL
Specific Gravity, Urine: 1.003 — ABNORMAL LOW (ref 1.005–1.030)
pH: 7 (ref 5.0–8.0)

## 2019-10-30 LAB — CBC WITH DIFFERENTIAL/PLATELET
Abs Immature Granulocytes: 0.01 10*3/uL (ref 0.00–0.07)
Basophils Absolute: 0 10*3/uL (ref 0.0–0.1)
Basophils Relative: 0 %
Eosinophils Absolute: 0.1 10*3/uL (ref 0.0–0.5)
Eosinophils Relative: 1 %
HCT: 39.9 % (ref 36.0–46.0)
Hemoglobin: 13 g/dL (ref 12.0–15.0)
Immature Granulocytes: 0 %
Lymphocytes Relative: 28 %
Lymphs Abs: 1.7 10*3/uL (ref 0.7–4.0)
MCH: 29.9 pg (ref 26.0–34.0)
MCHC: 32.6 g/dL (ref 30.0–36.0)
MCV: 91.7 fL (ref 80.0–100.0)
Monocytes Absolute: 0.6 10*3/uL (ref 0.1–1.0)
Monocytes Relative: 10 %
Neutro Abs: 3.6 10*3/uL (ref 1.7–7.7)
Neutrophils Relative %: 61 %
Platelets: 222 10*3/uL (ref 150–400)
RBC: 4.35 MIL/uL (ref 3.87–5.11)
RDW: 13 % (ref 11.5–15.5)
WBC: 6 10*3/uL (ref 4.0–10.5)
nRBC: 0 % (ref 0.0–0.2)

## 2019-10-30 LAB — BASIC METABOLIC PANEL
Anion gap: 12 (ref 5–15)
BUN: 17 mg/dL (ref 8–23)
CO2: 27 mmol/L (ref 22–32)
Calcium: 9.9 mg/dL (ref 8.9–10.3)
Chloride: 101 mmol/L (ref 98–111)
Creatinine, Ser: 1.09 mg/dL — ABNORMAL HIGH (ref 0.44–1.00)
GFR calc Af Amer: 54 mL/min — ABNORMAL LOW (ref >60)
GFR calc non Af Amer: 47 mL/min — ABNORMAL LOW (ref >60)
Glucose, Bld: 134 mg/dL — ABNORMAL HIGH (ref 70–99)
Potassium: 4.2 mmol/L (ref 3.5–5.1)
Sodium: 140 mmol/L (ref 135–145)

## 2019-10-30 LAB — BRAIN NATRIURETIC PEPTIDE: B Natriuretic Peptide: 42.9 pg/mL (ref 0.0–100.0)

## 2019-10-30 LAB — PROTIME-INR
INR: 2.1 — ABNORMAL HIGH (ref 0.8–1.2)
Prothrombin Time: 22.7 seconds — ABNORMAL HIGH (ref 11.4–15.2)

## 2019-10-30 LAB — TROPONIN I (HIGH SENSITIVITY): Troponin I (High Sensitivity): 4 ng/L (ref <18)

## 2019-10-30 NOTE — ED Triage Notes (Signed)
Patient comes from home. Patient states her feet is swelling and it started on Thursday. Patient states that her blood pressure is high and her heart is beating fast. This started 10/30/2019.

## 2019-10-30 NOTE — ED Provider Notes (Signed)
TIME SEEN: 1:40 AM  CHIEF COMPLAINT: Hypertension  HPI: Patient is an 85 year old female with history of hypertension, paroxysmal A. fib, dyslipidemia who presents to the emergency department with concerns for hypertension.  Reports her systolic blood pressure is normally in the 130s to 140s.  States she checked it tonight at home which is not abnormal for her to do and it was in the 465K systolic.  Reports compliance with all of her home medications.  Does state that she was given samples for medications recently but cannot recall the name.  She denies headache, vision changes, numbness, tingling or focal weakness.  Does report some intermittent shortness of breath but no chest pain or chest discomfort.  Was recently seen in urgent care for swelling and bruising to the left foot with no known injury.  X-ray at that time was unremarkable.  No calf tenderness or calf swelling.  ROS: See HPI Constitutional: no fever  Eyes: no drainage  ENT: no runny nose   Cardiovascular:  no chest pain  Resp:  SOB  GI: no vomiting GU: no dysuria Integumentary: no rash  Allergy: no hives  Musculoskeletal: no leg swelling  Neurological: no slurred speech ROS otherwise negative  PAST MEDICAL HISTORY/PAST SURGICAL HISTORY:  Past Medical History:  Diagnosis Date  . Anxiety state, unspecified   . Bronchitis, not specified as acute or chronic   . Diaphragmatic hernia without mention of obstruction or gangrene   . Dyslipidemia   . Essential hypertension   . Irritable bowel syndrome   . Mitral valve disorders(424.0)    a. 08/2010 Echo: EF >55%, mild MR, mod TR, trace AI.  [name] Non-obstructive CAD    a. 07/2010 Lexiscan MV: no ischemia, EF 78%; b. 07/2010 Cath: LM nl, LAD50/60p, 40 apical, LCX nl, RCA dominant, 30/40p, 46m  . Osteoarthrosis, unspecified whether generalized or localized, unspecified site   . Osteoporosis, unspecified   . Other abnormal glucose   . PAF (paroxysmal atrial fibrillation) (HCC)    a.  CHA2DS2VASc = 4-->coumadin.  .Marland KitchenPhlebitis and thrombophlebitis of superficial vessels of lower extremities   . PSVT (paroxysmal supraventricular tachycardia) ([place]    a. 05/2012 Holter: short bursts of PSVT noted-->Managed with toprol.  .Marland KitchenUnspecified venous (peripheral) insufficiency   . Unspecified vitamin D deficiency     MEDICATIONS:  Prior to Admission medications   Medication Sig Start Date End Date Taking? Authorizing Provider  ACCU-CHEK FASTCLIX LANCETS MISC Use to check blood sugars twice a day Dx E11.9 07/18/15   Plotnikov, AEvie Lacks MD  Alcohol Swabs (B-D SINGLE USE SWABS REGULAR) PADS Use to clean injection site to check blood sugars daily 05/16/19   Plotnikov, AEvie Lacks MD  ALPRAZolam (XANAX) 0.5 MG tablet TAKE 1/2 TO 1 TABLET BY MOUTH THREE TIMES DAILY AS NEEDED FOR ANXIETY. DO NOT TAKE WITH TRAMADOL.THIS IS A 30 DAY SUPPLY 08/24/19   Plotnikov, AEvie Lacks MD  amLODipine (NORVASC) 5 MG tablet Take 1 tablet (5 mg total) by mouth daily. 06/28/19   Plotnikov, AEvie Lacks MD  aspirin 81 MG tablet Take 81 mg by mouth daily.      [provider]  Blood Glucose Calibration (ACCU-CHEK AVIVA) SOLN Use to calibrate BS monitor 05/16/19   Plotnikov, AEvie Lacks MD  Blood Glucose Monitoring Suppl (ACCU-CHEK AVIVA PLUS) w/Device KIT Use to check blood sugars twice a day Dx E11.9 04/02/16   Plotnikov, AEvie Lacks MD  Blood Glucose Monitoring Suppl (ACCU-CHEK AVIVA PLUS) w/Device KIT Use to check blood sugars  daily 05/16/19   Plotnikov, Evie Lacks, MD  Cholecalciferol (VITAMIN D3) 50 MCG (2000 UT) capsule Take 1 capsule (2,000 Units total) by mouth daily. 08/02/19   Plotnikov, Evie Lacks, MD  clidinium-chlordiazePOXIDE (LIBRAX) 5-2.5 MG capsule TAKE ONE CAPSULE BY MOUTH THREE TIMES DAILY AS NEEDED FOR CRAMPS 05/02/19   Plotnikov, Evie Lacks, MD  diazepam (VALIUM) 5 MG tablet Take by mouth. 08/31/19   [provider]  furosemide (LASIX) 20 MG tablet TAKE 1 TABLET(20 MG) BY MOUTH DAILY AS NEEDED FOR  SWELLING 10/27/19   Hilty, Nadean Corwin, MD  glucose blood (ACCU-CHEK AVIVA PLUS) test strip Use to check blood sugars twice a day 07/15/18   Plotnikov, Evie Lacks, MD  glucose blood (ACCU-CHEK AVIVA PLUS) test strip Use to check blood sugars daily 05/16/19   Plotnikov, Evie Lacks, MD  hydroxypropyl methylcellulose (ISOPTO TEARS) 2.5 % ophthalmic solution Place 1 drop into both eyes 2 (two) times daily.      [provider]  hydrOXYzine (ATARAX/VISTARIL) 25 MG tablet Take 1 tablet (25 mg total) by mouth every 8 (eight) hours as needed for itching. 01/19/19   Plotnikov, Evie Lacks, MD  isosorbide mononitrate (IMDUR) 30 MG 24 hr tablet TAKE 1 TABLET BY MOUTH EVERY DAY 07/05/19   Hilty, Nadean Corwin, MD  Lancets (ACCU-CHEK SOFT TOUCH) lancets Use as instructed 05/16/19   Plotnikov, Evie Lacks, MD  metoprolol succinate (TOPROL-XL) 50 MG 24 hr tablet TAKE 1 TABLET(50 MG) BY MOUTH TWICE DAILY 10/17/19   Hilty, Nadean Corwin, MD  Nabumetone (RELAFEN DS) 1000 MG TABS Take 1 tablet by mouth daily as needed. 10/27/19   Janith Lima, MD  pantoprazole (PROTONIX) 40 MG tablet TAKE 1 TABLET BY MOUTH EVERY DAY 06/06/19   Hilty, Nadean Corwin, MD  simvastatin (ZOCOR) 20 MG tablet TAKE 1 TABLET BY MOUTH AT BEDTIME 03/07/19   Plotnikov, Evie Lacks, MD  traMADol (ULTRAM) 50 MG tablet Take 1 tablet (50 mg total) by mouth 3 (three) times daily. 09/21/18   Plotnikov, Evie Lacks, MD  triamcinolone cream (KENALOG) 0.1 % Apply 1 application topically 2 (two) times daily. 01/19/19   Plotnikov, Evie Lacks, MD  warfarin (COUMADIN) 5 MG tablet TAKE 1 TABLET BY MOUTH EVERY DAY AS DIRECTED BY COUMADIN CLINIC 09/26/19   Hilty, Nadean Corwin, MD    ALLERGIES:  Allergies  Allergen Reactions  . Clarithromycin     REACTION: unspecified  . Fosamax [Alendronate Sodium]     achy  . Guaifenesin Er     itching  . Metformin     REACTION: pt states INTOL to Metformin "felt drained"  . Tape     Adhesive tape---rash    SOCIAL HISTORY:  Social History    Tobacco Use  . Smoking status: Never Smoker  . Smokeless tobacco: Never Used  Substance Use Topics  . Alcohol use: No    FAMILY HISTORY: Family History  Problem Relation Age of Onset  . Stroke Mother   . Stroke Father        also heart disease  . Clotting disorder Paternal Grandmother        blood clot  . Stroke Brother        also heart disease    EXAM: BP (!) 172/85 (BP Location: Left Arm)   Pulse 68   Temp 97.8 F (36.6 C) (Oral)   Resp 20   Ht '5\' 6"'$  (1.676 m)   Wt 82.6 kg   SpO2 98%   BMI 29.38 kg/m  CONSTITUTIONAL:  Alert and oriented and responds appropriately to questions. Well-appearing; well-nourished, elderly, in no distress HEAD: Normocephalic EYES: Conjunctivae clear, pupils appear equal, EOM appear intact ENT: normal nose; moist mucous membranes NECK: Supple, normal ROM CARD: RRR; S1 and S2 appreciated; no murmurs, no clicks, no rubs, no gallops RESP: Normal chest excursion without splinting or tachypnea; breath sounds clear and equal bilaterally; no wheezes, no rhonchi, no rales, no hypoxia or respiratory distress, speaking full sentences ABD/GI: Normal bowel sounds; non-distended; soft, non-tender, no rebound, no guarding, no peritoneal signs, no hepatosplenomegaly BACK:  The back appears normal EXT: Normal ROM in all joints; no deformity noted, trace symmetric edema in bilateral lower extremities noted when removing patient's socks; no cyanosis, small amount of bruising and soft tissue swelling noted to the toes of the left foot without deformity or significant tenderness, no redness or warmth, 2+ DP pulses bilaterally, no calf tenderness or calf swelling SKIN: Normal color for age and race; warm; no rash on exposed skin NEURO: Moves all extremities equally, normal sensation diffusely, cranial nerves II through XII intact, normal speech PSYCH: The patient's mood and manner are appropriate.   MEDICAL DECISION MAKING: Patient here with hypertension.  Seems  to be mostly asymptomatic other than some shortness of breath.  We will continue to closely monitor.  Will obtain labs, urine, EKG, chest x-ray.  Blood pressure while I was in the room was 152/81.  I think we can hold on blood pressure medication at this time.  ED PROGRESS: EKG shows no significant change compared to previous.  Labs unremarkable.  Chest x-ray clear.  Troponin negative.  BNP normal.  Urine shows moderate hemoglobinuria which is chronic.  No proteinuria.  Blood pressure currently 107/58.  Patient asymptomatic.  Will discharge home with outpatient PCP follow-up.  At this time, I do not feel there is any life-threatening condition present. I have reviewed, interpreted and discussed all results (EKG, imaging, lab, urine as appropriate) and exam findings with patient/family. I have reviewed nursing notes and appropriate previous records.  I feel the patient is safe to be discharged home without further emergent workup and can continue workup as an outpatient as needed. Discussed usual and customary return precautions. Patient/family verbalize understanding and are comfortable with this plan.  Outpatient follow-up has been provided as needed. All questions have been answered.     EKG Interpretation  Date/Time:  Sunday October 30 2019 02:05:52 EDT Ventricular Rate:  59 PR Interval:    QRS Duration: 105 QT Interval:  437 QTC Calculation: 433 R Axis:   -42 Text Interpretation: Sinus rhythm Abnormal R-wave progression, early transition Left ventricular hypertrophy Confirmed by Pryor Curia [id number]) on 10/30/2019 2:14:55 AM          [name] was evaluated in Emergency Department on 10/30/2019 for the symptoms described in the history of present illness. She was evaluated in the context of the global COVID-19 pandemic, which necessitated consideration that the patient might be at risk for infection with the SARS-CoV-2 virus that causes COVID-19. Institutional protocols and algorithms  that pertain to the evaluation of patients at risk for COVID-19 are in a state of rapid change based on information released by regulatory bodies including the CDC and federal and state organizations. These policies and algorithms were followed during the patient's care in the ED.      Vash Quezada, Delice Bison, DO 10/30/19 [phone]

## 2019-10-30 NOTE — ED Notes (Signed)
Patient given some ginger ale to drink.

## 2019-10-30 NOTE — ED Notes (Signed)
Attempted IV x 3 with no success.  

## 2019-10-30 NOTE — Discharge Instructions (Addendum)
Your labs, urine, chest x-ray, EKG were reassuring today.  Your blood pressure has improved on its own and is 107/58.  Please follow-up with your primary care physician.  I do not think this time that you need your medications adjusted from the emergency department.

## 2019-11-01 ENCOUNTER — Ambulatory Visit (INDEPENDENT_AMBULATORY_CARE_PROVIDER_SITE_OTHER): Payer: Medicare HMO | Admitting: Internal Medicine

## 2019-11-01 ENCOUNTER — Encounter: Payer: Self-pay | Admitting: Physician Assistant

## 2019-11-01 ENCOUNTER — Encounter: Payer: Self-pay | Admitting: Internal Medicine

## 2019-11-01 ENCOUNTER — Ambulatory Visit: Payer: Medicare HMO | Admitting: Orthopaedic Surgery

## 2019-11-01 ENCOUNTER — Telehealth: Payer: Self-pay

## 2019-11-01 ENCOUNTER — Telehealth: Payer: Self-pay | Admitting: Orthopaedic Surgery

## 2019-11-01 ENCOUNTER — Telehealth: Payer: Self-pay | Admitting: Internal Medicine

## 2019-11-01 ENCOUNTER — Other Ambulatory Visit: Payer: Self-pay

## 2019-11-01 DIAGNOSIS — M7670 Peroneal tendinitis, unspecified leg: Secondary | ICD-10-CM | POA: Insufficient documentation

## 2019-11-01 DIAGNOSIS — E559 Vitamin D deficiency, unspecified: Secondary | ICD-10-CM | POA: Diagnosis not present

## 2019-11-01 DIAGNOSIS — I10 Essential (primary) hypertension: Secondary | ICD-10-CM | POA: Diagnosis not present

## 2019-11-01 DIAGNOSIS — I2510 Atherosclerotic heart disease of native coronary artery without angina pectoris: Secondary | ICD-10-CM | POA: Diagnosis not present

## 2019-11-01 DIAGNOSIS — M7672 Peroneal tendinitis, left leg: Secondary | ICD-10-CM | POA: Diagnosis not present

## 2019-11-01 DIAGNOSIS — M79672 Pain in left foot: Secondary | ICD-10-CM | POA: Diagnosis not present

## 2019-11-01 DIAGNOSIS — M7671 Peroneal tendinitis, right leg: Secondary | ICD-10-CM

## 2019-11-01 DIAGNOSIS — I2583 Coronary atherosclerosis due to lipid rich plaque: Secondary | ICD-10-CM | POA: Diagnosis not present

## 2019-11-01 DIAGNOSIS — M25572 Pain in left ankle and joints of left foot: Secondary | ICD-10-CM | POA: Diagnosis not present

## 2019-11-01 MED ORDER — PREDNISONE 5 MG (21) PO TBPK: ORAL_TABLET | ORAL | 0 refills | Status: DC

## 2019-11-01 MED ORDER — DICLOFENAC SODIUM 1 % EX GEL: 2.0000 g | Freq: Four times a day (QID) | CUTANEOUS | 1 refills | Status: AC

## 2019-11-01 MED ORDER — DICLOFENAC SODIUM 75 MG PO TBEC: 75.0000 mg | DELAYED_RELEASE_TABLET | Freq: Two times a day (BID) | ORAL | 2 refills | Status: DC | PRN

## 2019-11-01 NOTE — Telephone Encounter (Signed)
She states she is taking coumadin, still okay to take all meds prescribed today?

## 2019-11-01 NOTE — Patient Instructions (Signed)
Peroneal Tendinopathy  Peroneal tendinopathy is irritation of the tendons that pass behind your ankle (peroneal tendons). These tendons attach muscles in your foot to a bone on the side of your foot and underneath the arch of your foot. This condition can cause your peroneal tendons to get bigger and swell. What are the causes? This condition may be caused by:  Putting stress on your ankle over and over again (overuse injury).  A sudden injury that puts stress on your tendons, such as an ankle sprain. What increases the risk? You are more likely to develop this condition if you:  Have high arches.  Play sports that involve putting stress on the ankle over and over again. These sports include: ? Running. ? Dancing. ? Soccer. ? Basketball. What are the signs or symptoms? Symptoms of this condition can start suddenly or develop gradually. Symptoms of this condition include:  Pain in the back of the ankle, on the side of the foot, or in the arch of the foot.  Pain that gets worse with activity and better with rest.  Swelling.  Warmth.  Weakness in your foot or ankle. How is this diagnosed? This condition may be diagnosed based on:  Your symptoms.  Your medical history.  A physical exam. During the exam, your health care provider may move your foot and ankle and test the strength of your leg muscles.  Imaging tests, such as: ? X-rays or a CT scan to check for bone injury. ? MRI or ultrasound to check for muscle or tendon injury. How is this treated? This condition may be treated by:  Keeping your body weight off your ankle for several days.  Returning to full activity gradually.  Putting ice on your ankle to reduce swelling.  Taking NSAIDs, such as ibuprofen.  Having medicine injected into your tendon to reduce swelling.  Wearing a removable boot or brace for ankle support.  Doing range-of-motion exercises and strengthening exercises (physical therapy) when pain  and swelling improve. If the condition does not improve with treatment, or if a tendon or muscle is damaged, surgery may be needed. Follow these instructions at home: If you have a boot or brace:  Wear the boot or brace as told by your health care provider. Remove it only as told by your health care provider.  Loosen the boot or brace if your toes tingle, become numb, or turn cold and blue.  Keep the boot or brace clean.  If the boot or brace is not waterproof: ? Do not let it get wet. ? Cover it with a watertight covering when you take a bath or shower. Managing pain, stiffness, and swelling   If directed, put ice on the injured area. ? If you have a removable boot or brace, remove it as told by your health care provider. ? Put ice in a plastic bag. ? Place a towel between your skin and the bag. ? Leave the ice on for 20 minutes, 2-3 times a day.  Move your toes often to reduce stiffness and swelling.  Raise (elevate) your ankle above the level of your heart while you are sitting or lying down. Activity  Do not do activities that make pain or swelling worse.  Do exercises as told by your health care provider.  Return to your normal activities as told by your health care provider. Ask your health care provider what activities are safe for you.  Ask your health care provider when it is safe to drive  if you have a boot or brace on your foot. General instructions  Take over-the-counter and prescription medicines only as told by your health care provider.  Do not use any products that contain nicotine or tobacco, such as cigarettes, e-cigarettes, and chewing tobacco. These can delay healing. If you need help quitting, ask your health care provider.  Keep all follow-up visits as told by your health care provider. This is important. How is this prevented?  Wear supportive footwear that is appropriate for your athletic activity.  Avoid athletic activities that cause swelling or  pain in your ankle or foot.  See your health care provider if you have pain or swelling that does not improve after a few days of rest.  Stop training if you develop pain or swelling.  If you start a new athletic activity, start gradually to build up your strength, endurance, and flexibility.  Warm up and stretch before being active.  Cool down and stretch after being active. Contact a health care provider if:  Your symptoms get worse.  Your symptoms do not improve in 2-4 weeks.  You develop new, unexplained symptoms. Summary  Peroneal tendinopathy is irritation of the tendons that pass behind your ankle.  This condition is caused by overuse or sudden injury to the peroneal tendon.  Symptoms include pain, swelling, warmth, and weakness in your foot or ankle.  This condition is treated with rest, ice, medicines, physical therapy, and surgery if needed. This information is not intended to replace advice given to you by your health care provider. Make sure you discuss any questions you have with your health care provider. Document Revised: 07/22/2018 Document Reviewed: 05/10/2018 Elsevier Patient Education  [place].

## 2019-11-01 NOTE — Assessment & Plan Note (Signed)
No CP 

## 2019-11-01 NOTE — Telephone Encounter (Signed)
F/U with coumadin clinic scheduled for Friday 7/23. Okay to take prednisone as prescribed. Need to clarify Diclofenac Rx (Computer Rx is for Gel but patient received tablets).   Telephone message placed for Dr Venida Jarvis to clarify Diclofenac 75mg  tablets orders. Patient should be on prednisone taper and Voltaren Gel only.

## 2019-11-01 NOTE — Telephone Encounter (Signed)
Pt called stating she would like a CB regarding the side effects of her medicine.  [phone]

## 2019-11-01 NOTE — Telephone Encounter (Signed)
Pt went to a Podiatrist today. She has some tendonitis in her foot and the podiatrist put her on a steroid (prednisone 5 mg/ take 1 tablet twice daily as needed) She was also given a pain killer ( Diclofenac sodium 75 mg / take 1 tablet 2x daily as needed  ).  She wanted to let the Coumadin clinic know about the changes in her medication

## 2019-11-01 NOTE — Assessment & Plan Note (Addendum)
L LLE pain - peroneal tendonitis Boot Steroids po

## 2019-11-01 NOTE — Telephone Encounter (Signed)
Please call patient's pharmacy to clarify Rx's for patient.  Patient's pharmacy is [facility] on [address]. Pharmacy# [phone].  Please advise.  Thank you.

## 2019-11-01 NOTE — Assessment & Plan Note (Addendum)
ER notes reviewed Cont w/meds Metoprolol, Norvasc, Lasix  Wt Readings from Last 3 Encounters:  11/01/19 181 lb (82.1 kg)  10/30/19 182 lb (82.6 kg)  10/27/19 182 lb (82.6 kg)

## 2019-11-01 NOTE — Assessment & Plan Note (Signed)
Vit D 

## 2019-11-01 NOTE — Telephone Encounter (Signed)
Called pharm. Per lindsey take prednisone. Once she finishes prednisone she can do diclofenac

## 2019-11-01 NOTE — Progress Notes (Signed)
Office Visit Note   Patient: [name]           Date of Birth: [birth year]           MRN: [id number] Visit Date: 11/01/2019              Requested by: Janith Lima, MD [place],   [id number] PCP: Alain Marion, Evie Lacks, MD   Assessment & Plan: Visit Diagnoses:  1. Peroneal tendinitis of left lower leg   2. Pain in left foot     Plan: Impression is left leg peroneal tendinitis with possible Morton's neuroma and metatarsalgia.  We we will start the patient on a steroid taper and Voltaren gel, and provide her with a cam walker.  She will follow-up with [place] in 2 to 3 weeks for recheck.  Call with concerns or questions.  Follow-Up Instructions: Return in about 2 weeks (around 11/15/2019).   Orders:  No orders of the defined types were placed in this encounter.  No orders of the defined types were placed in this encounter.     Procedures: No procedures performed   Clinical Data: No additional findings.   Subjective: Chief Complaint  Patient presents with  . Left Foot - Pain    HPI patient is a very pleasant 85 year old female who comes in today with left foot pain.  She has had pain here for around 1 week.  No specific injury or change in activity leading up to her symptoms.  The pain she has is along the dorsum of the lateral foot and radiates up the lateral leg.  She notes that this is not constant.  She has increased pain when putting lots of pressure on the foot or when she is walking on it for a while.  She has tried Relafen with moderate relief of symptoms but notes that this increase her blood pressure.  She denies any numbness, tingling or burning.  Review of Systems as detailed in HPI.  All others reviewed and are negative.   Objective: Vital Signs: There were no vitals taken for this visit.  Physical Exam well-developed well-nourished female no acute distress.  Alert and oriented x3.  Ortho Exam examination of her left foot reveals  mild swelling throughout.  She does have moderate tenderness along the dorsum of the foot primarily between the third and fourth metatarsal heads as well as to the fourth metatarsal head itself.  He also has pain along the peroneal tendon.  She is able to fully extend and flex all 5 toes.  She is able to plantar and dorsiflex without pain.  She is neurovascularly intact distally.  Specialty Comments:  No specialty comments available.  Imaging: Imaging of the left foot reviewed by me in canopy shows no acute findings.  PMFS History: Patient Active Problem List   Diagnosis Date Noted  . Spontaneous rupture of extensor tendon of left foot 10/28/2019  . Contusion of left foot 10/27/2019  . Sinusitis 04/13/2019  . Rash 12/05/2016  . Dyspnea 04/24/2016  . Tricuspid valve insufficiency 04/24/2016  . Cerumen impaction 11/19/2015  . Ear discomfort 11/13/2015  . Grief at loss of child 09/14/2015  . Well adult exam 09/14/2015  . Dyslipidemia   . PSVT (paroxysmal supraventricular tachycardia) ([place])   . PAF (paroxysmal atrial fibrillation) ([place])   . Viral gastroenteritis 07/21/2015  . Acute upper respiratory infection 04/17/2015  . Edema 11/08/2014  . Chronic venous insufficiency 11/08/2014  . Diarrhea 05/01/2014  .  Mass of left thigh 12/14/2013  . Microhematuria 12/14/2013  . Long term (current) use of anticoagulants 08/19/2013  . CAD (coronary artery disease) 05/21/2011  . Atrial tachycardia ([place]) 11/19/2010  . CHEST PAIN 08/07/2009  . Vitamin D deficiency 04/19/2008  . SUPERFICIAL THROMBOPHLEBITIS 10/20/2007  . DM2 (diabetes mellitus, type 2) ([place]) 03/16/2007  . HYPERCHOLESTEROLEMIA 03/15/2007  . Anxiety 03/15/2007  . Essential hypertension 03/15/2007  . Mitral valve disorder 03/15/2007  . BRONCHITIS 03/15/2007  . HIATAL HERNIA 03/15/2007  . Irritable bowel syndrome 03/15/2007  . Osteoarthritis 03/15/2007  . Osteoporosis 03/15/2007   Past Medical History:  Diagnosis Date  .  Anxiety state, unspecified   . Bronchitis, not specified as acute or chronic   . Diaphragmatic hernia without mention of obstruction or gangrene   . Dyslipidemia   . Essential hypertension   . Irritable bowel syndrome   . Mitral valve disorders(424.0)    a. 08/2010 Echo: EF >55%, mild MR, mod TR, trace AI.  [name] Non-obstructive CAD    a. 07/2010 Lexiscan MV: no ischemia, EF 78%; b. 07/2010 Cath: LM nl, LAD50/60p, 40 apical, LCX nl, RCA dominant, 30/40p, 62m.  . Osteoarthrosis, unspecified whether generalized or localized, unspecified site   . Osteoporosis, unspecified   . Other abnormal glucose   . PAF (paroxysmal atrial fibrillation) (HCC)    a. CHA2DS2VASc = 4-->coumadin.  [name] Phlebitis and thrombophlebitis of superficial vessels of lower extremities   . PSVT (paroxysmal supraventricular tachycardia) ([place])    a. 05/2012 Holter: short bursts of PSVT noted-->Managed with toprol.  [name] Unspecified venous (peripheral) insufficiency   . Unspecified vitamin D deficiency     Family History  Problem Relation Age of Onset  . Stroke Mother   . Stroke Father        also heart disease  . Clotting disorder Paternal Grandmother        blood clot  . Stroke Brother        also heart disease    Past Surgical History:  Procedure Laterality Date  . CARDIAC CATHETERIZATION  07/30/2010   nonobstructive CAD, 20-40% RCA stenosis, 50-60% eccentric LAD stenosis more prox, 40% LAD stenosis more distal, EF 65%  . TRANSTHORACIC ECHOCARDIOGRAM  09/03/2010   EF=>55%; mild MR; mod TR; AV mildly sclerotic with trace regurg; mild pulm valve regurg  . VESICOVAGINAL FISTULA CLOSURE W/ TAH     Social History   Occupational History  . Occupation: retired    Fish farm manager: RETIRED  Tobacco Use  . Smoking status: Never Smoker  . Smokeless tobacco: Never Used  Vaping Use  . Vaping Use: Never used  Substance and Sexual Activity  . Alcohol use: No  . Drug use: No  . Sexual activity: Not Currently

## 2019-11-01 NOTE — Progress Notes (Signed)
Subjective:  Patient ID: [name], female    DOB: November 06, 1934  Age: 85 y.o. MRN: [id number]  CC: No chief complaint on file.   HPI [name] presents for LLE pain - peroneal tendonitis - in a boot, medrol pack F/u HTN - ER visit on 7/18 - reviewed,  Outpatient Medications Prior to Visit  Medication Sig Dispense Refill  . ACCU-CHEK FASTCLIX LANCETS MISC Use to check blood sugars twice a day Dx E11.9 102 each 2  . Alcohol Swabs (B-D SINGLE USE SWABS REGULAR) PADS Use to clean injection site to check blood sugars daily 300 each 3  . ALPRAZolam (XANAX) 0.5 MG tablet TAKE 1/2 TO 1 TABLET BY MOUTH THREE TIMES DAILY AS NEEDED FOR ANXIETY. DO NOT TAKE WITH TRAMADOL.THIS IS A 30 DAY SUPPLY 90 tablet 1  . amLODipine (NORVASC) 5 MG tablet Take 1 tablet (5 mg total) by mouth daily. 90 tablet 3  . aspirin 81 MG tablet Take 81 mg by mouth daily.      . Blood Glucose Calibration (ACCU-CHEK AVIVA) SOLN Use to calibrate BS monitor 3 each 3  . Blood Glucose Monitoring Suppl (ACCU-CHEK AVIVA PLUS) w/Device KIT Use to check blood sugars twice a day Dx E11.9 1 kit 0  . Blood Glucose Monitoring Suppl (ACCU-CHEK AVIVA PLUS) w/Device KIT Use to check blood sugars daily 1 kit 0  . Cholecalciferol (VITAMIN D3) 50 MCG (2000 UT) capsule Take 1 capsule (2,000 Units total) by mouth daily. 100 capsule 3  . clidinium-chlordiazePOXIDE (LIBRAX) 5-2.5 MG capsule TAKE ONE CAPSULE BY MOUTH THREE TIMES DAILY AS NEEDED FOR CRAMPS 20 capsule 2  . diazepam (VALIUM) 5 MG tablet Take by mouth.    . diclofenac Sodium (VOLTAREN) 1 % GEL Apply 2 g topically 4 (four) times daily. 150 g 1  . furosemide (LASIX) 20 MG tablet TAKE 1 TABLET(20 MG) BY MOUTH DAILY AS NEEDED FOR SWELLING 30 tablet 3  . glucose blood (ACCU-CHEK AVIVA PLUS) test strip Use to check blood sugars twice a day 100 each 2  . glucose blood (ACCU-CHEK AVIVA PLUS) test strip Use to check blood sugars daily 300 each 3  . hydroxypropyl methylcellulose  (ISOPTO TEARS) 2.5 % ophthalmic solution Place 1 drop into both eyes 2 (two) times daily.      . hydrOXYzine (ATARAX/VISTARIL) 25 MG tablet Take 1 tablet (25 mg total) by mouth every 8 (eight) hours as needed for itching. 60 tablet 0  . isosorbide mononitrate (IMDUR) 30 MG 24 hr tablet TAKE 1 TABLET BY MOUTH EVERY DAY 30 tablet 11  . Lancets (ACCU-CHEK SOFT TOUCH) lancets Use as instructed 300 each 3  . metoprolol succinate (TOPROL-XL) 50 MG 24 hr tablet TAKE 1 TABLET(50 MG) BY MOUTH TWICE DAILY 180 tablet 1  . Nabumetone (RELAFEN DS) 1000 MG TABS Take 1 tablet by mouth daily as needed. 6 tablet 0  . pantoprazole (PROTONIX) 40 MG tablet TAKE 1 TABLET BY MOUTH EVERY DAY 90 tablet 3  . predniSONE (STERAPRED UNI-PAK 21 TAB) 5 MG (21) TBPK tablet Take as directed 21 tablet 0  . simvastatin (ZOCOR) 20 MG tablet TAKE 1 TABLET BY MOUTH AT BEDTIME 90 tablet 3  . traMADol (ULTRAM) 50 MG tablet Take 1 tablet (50 mg total) by mouth 3 (three) times daily. 90 tablet 3  . triamcinolone cream (KENALOG) 0.1 % Apply 1 application topically 2 (two) times daily. 80 g 0  . warfarin (COUMADIN) 5 MG tablet TAKE 1 TABLET BY MOUTH EVERY  DAY AS DIRECTED BY COUMADIN CLINIC 90 tablet 0   No facility-administered medications prior to visit.    ROS: Review of Systems  Constitutional: Positive for fatigue. Negative for activity change, appetite change, chills and unexpected weight change.  HENT: Negative for congestion, mouth sores and sinus pressure.   Eyes: Negative for visual disturbance.  Respiratory: Negative for cough and chest tightness.   Gastrointestinal: Negative for abdominal pain and nausea.  Genitourinary: Negative for difficulty urinating, frequency and vaginal pain.  Musculoskeletal: Positive for arthralgias, back pain and gait problem.  Skin: Negative for pallor and rash.  Neurological: Negative for dizziness, tremors, weakness, numbness and headaches.  Psychiatric/Behavioral: Negative for confusion and  sleep disturbance.    Objective:  BP 116/60 (BP Location: Left Arm, Patient Position: Sitting, Cuff Size: Large)   Pulse (!) 55   Temp 98 F (36.7 C) (Oral)   Ht _0  (1.676 m)   Wt 181 lb (82.1 kg)   SpO2 98%   BMI 29.21 kg/m   BP Readings from Last 3 Encounters:  11/01/19 116/60  10/30/19 129/77  10/27/19 (!) 150/70    Wt Readings from Last 3 Encounters:  11/01/19 181 lb (82.1 kg)  10/30/19 182 lb (82.6 kg)  10/27/19 182 lb (82.6 kg)    Physical Exam LLE in a boot Lab Results  Component Value Date   WBC 6.0 10/30/2019   HGB 13.0 10/30/2019   HCT 39.9 10/30/2019   PLT 222 10/30/2019   GLUCOSE 134 (H) 10/30/2019   CHOL 176 11/18/2018   TRIG 75.0 11/18/2018   HDL 72.20 11/18/2018   LDLDIRECT 105.5 11/26/2010   LDLCALC 89 11/18/2018   ALT 12 05/02/2019   AST 19 05/02/2019   NA 140 10/30/2019   K 4.2 10/30/2019   CL 101 10/30/2019   CREATININE 1.09 (H) 10/30/2019   BUN 17 10/30/2019   CO2 27 10/30/2019   TSH 1.00 05/02/2019   INR 2.1 (H) 10/30/2019   HGBA1C 7.1 (H) 08/02/2019    DG Chest Port 1 View  Result Date: 10/30/2019 CLINICAL DATA:  Foot swelling. EXAM: PORTABLE CHEST 1 VIEW COMPARISON:  January 07, 2018 FINDINGS: There is no evidence of acute infiltrate, pleural effusion or pneumothorax. The heart size and mediastinal contours are within normal limits. The visualized skeletal structures are unremarkable. IMPRESSION: No active disease. Electronically Signed   By: Virgina Norfolk M.D.   On: 10/30/2019 02:47    Assessment & Plan:   There are no diagnoses linked to this encounter.   No orders of the defined types were placed in this encounter.    Follow-up: No follow-ups on file.  Walker Kehr, MD

## 2019-11-02 ENCOUNTER — Telehealth: Payer: Self-pay | Admitting: Orthopaedic Surgery

## 2019-11-02 NOTE — Telephone Encounter (Signed)
IC s/w patient. She had questions about prednisone pack. I clarified for her.

## 2019-11-02 NOTE — Telephone Encounter (Signed)
Do not take diclofenac at all then.  Ok to take prednisone

## 2019-11-02 NOTE — Telephone Encounter (Signed)
Patient called. She would like [name] to call her. Would like to talk about her medications. Her call back number is [phone]

## 2019-11-02 NOTE — Telephone Encounter (Signed)
I called pharmacy and patient.  Per Ria Comment, patient can use prednisone and diclofenac gel.  She is not to take diclofenac tablets. Patient aware.

## 2019-11-04 ENCOUNTER — Other Ambulatory Visit: Payer: Self-pay

## 2019-11-04 ENCOUNTER — Ambulatory Visit (INDEPENDENT_AMBULATORY_CARE_PROVIDER_SITE_OTHER): Payer: Medicare HMO | Admitting: Pharmacist

## 2019-11-04 DIAGNOSIS — I480 Paroxysmal atrial fibrillation: Secondary | ICD-10-CM | POA: Diagnosis not present

## 2019-11-04 DIAGNOSIS — Z7901 Long term (current) use of anticoagulants: Secondary | ICD-10-CM | POA: Diagnosis not present

## 2019-11-04 LAB — POCT INR: INR: 2.6 (ref 2.0–3.0)

## 2019-11-15 ENCOUNTER — Encounter: Payer: Self-pay | Admitting: Orthopaedic Surgery

## 2019-11-15 ENCOUNTER — Telehealth: Payer: Self-pay | Admitting: Internal Medicine

## 2019-11-15 ENCOUNTER — Telehealth: Payer: Self-pay

## 2019-11-15 ENCOUNTER — Ambulatory Visit: Payer: Medicare HMO | Admitting: Orthopaedic Surgery

## 2019-11-15 VITALS — Ht 66.0 in | Wt 186.0 lb

## 2019-11-15 DIAGNOSIS — M79672 Pain in left foot: Secondary | ICD-10-CM

## 2019-11-15 NOTE — Telephone Encounter (Signed)
Patient called in asking question about the medication she got today .

## 2019-11-15 NOTE — Telephone Encounter (Signed)
Patient got diclofenac tablets. She will not take medication d/t ddi with warfarin.

## 2019-11-15 NOTE — Progress Notes (Signed)
Office Visit Note   Patient: [name]           Date of Birth: 1934/04/26           MRN: [id number] Visit Date: 11/15/2019              Requested by: Kelly Anger, MD [place],  [name] [id number] PCP: Plotnikov, Evie Lacks, MD   Assessment & Plan: Visit Diagnoses:  1. Pain in left foot     Plan: Impression is slowly improving left foot pain.  She will consider ultrasound evaluation would like to give it more time for now.  She may wean out of the cam boot as tolerated.  She feels that it is overall getting better and she would like to give this more time.  Follow-up as needed.  Follow-Up Instructions: Return if symptoms worsen or fail to improve.   Orders:  No orders of the defined types were placed in this encounter.  No orders of the defined types were placed in this encounter.     Procedures: No procedures performed   Clinical Data: No additional findings.   Subjective: Chief Complaint  Patient presents with  . Left Foot - Follow-up    [name] returns today for left foot pain.  She overall feels "pretty good".  She states that it is slowly improving.   Review of Systems   Objective: Vital Signs: Ht 5\' 6"  (1.676 m)   Wt 186 lb (84.4 kg)   BMI 30.02 kg/m   Physical Exam  Ortho Exam Left foot shows lump over the fourth metatarsal head.  There are no skin changes or temperature changes not significantly tender to palpation.  Passive stretch of the toes does not elicit any pain. Specialty Comments:  No specialty comments available.  Imaging: No results found.   PMFS History: Patient Active Problem List   Diagnosis Date Noted  . Peroneal tendinitis 11/01/2019  . Spontaneous rupture of extensor tendon of left foot 10/28/2019  . Contusion of left foot 10/27/2019  . Sinusitis 04/13/2019  . Rash 12/05/2016  . Dyspnea 04/24/2016  . Tricuspid valve insufficiency 04/24/2016  . Cerumen impaction 11/19/2015  . Ear discomfort  11/13/2015  . Grief at loss of child 09/14/2015  . Well adult exam 09/14/2015  . Dyslipidemia   . PSVT (paroxysmal supraventricular tachycardia) ([place])   . PAF (paroxysmal atrial fibrillation) ([place])   . Viral gastroenteritis 07/21/2015  . Acute upper respiratory infection 04/17/2015  . Edema 11/08/2014  . Chronic venous insufficiency 11/08/2014  . Diarrhea 05/01/2014  . Mass of left thigh 12/14/2013  . Microhematuria 12/14/2013  . Long term (current) use of anticoagulants 08/19/2013  . CAD (coronary artery disease) 05/21/2011  . Atrial tachycardia ([place]) 11/19/2010  . CHEST PAIN 08/07/2009  . Vitamin D deficiency 04/19/2008  . SUPERFICIAL THROMBOPHLEBITIS 10/20/2007  . DM2 (diabetes mellitus, type 2) ([place]) 03/16/2007  . HYPERCHOLESTEROLEMIA 03/15/2007  . Anxiety 03/15/2007  . Essential hypertension 03/15/2007  . Mitral valve disorder 03/15/2007  . BRONCHITIS 03/15/2007  . HIATAL HERNIA 03/15/2007  . Irritable bowel syndrome 03/15/2007  . Osteoarthritis 03/15/2007  . Osteoporosis 03/15/2007   Past Medical History:  Diagnosis Date  . Anxiety state, unspecified   . Bronchitis, not specified as acute or chronic   . Diaphragmatic hernia without mention of obstruction or gangrene   . Dyslipidemia   . Essential hypertension   . Irritable bowel syndrome   . Mitral valve disorders(424.0)    a.  08/2010 Echo: EF >55%, mild MR, mod TR, trace AI.  [name] Non-obstructive CAD    a. 07/2010 Lexiscan MV: no ischemia, EF 78%; b. 07/2010 Cath: LM nl, LAD50/60p, 40 apical, LCX nl, RCA dominant, 30/40p, 59m.  . Osteoarthrosis, unspecified whether generalized or localized, unspecified site   . Osteoporosis, unspecified   . Other abnormal glucose   . PAF (paroxysmal atrial fibrillation) ([facility])    a. CHA2DS2VASc = 4-->coumadin.  [name] Phlebitis and thrombophlebitis of superficial vessels of lower extremities   . PSVT (paroxysmal supraventricular tachycardia) ([place])    a. 05/2012 Holter: short bursts of PSVT  noted-->Managed with toprol.  [name] Unspecified venous (peripheral) insufficiency   . Unspecified vitamin D deficiency     Family History  Problem Relation Age of Onset  . Stroke Mother   . Stroke Father        also heart disease  . Clotting disorder Paternal Grandmother        blood clot  . Stroke Brother        also heart disease    Past Surgical History:  Procedure Laterality Date  . CARDIAC CATHETERIZATION  07/30/2010   nonobstructive CAD, 20-40% RCA stenosis, 50-60% eccentric LAD stenosis more prox, 40% LAD stenosis more distal, EF 65%  . TRANSTHORACIC ECHOCARDIOGRAM  09/03/2010   EF=>55%; mild MR; mod TR; AV mildly sclerotic with trace regurg; mild pulm valve regurg  . VESICOVAGINAL FISTULA CLOSURE W/ TAH     Social History   Occupational History  . Occupation: retired    Fish farm manager: RETIRED  Tobacco Use  . Smoking status: Never Smoker  . Smokeless tobacco: Never Used  Vaping Use  . Vaping Use: Never used  Substance and Sexual Activity  . Alcohol use: No  . Drug use: No  . Sexual activity: Not Currently

## 2019-11-15 NOTE — Telephone Encounter (Signed)
Patient is calling to advise the coumadin clinic that Dr. Erlinda Hong prescribed diclofenac Sodium (VOLTAREN) 1 % GEL this am, but states she knows she can't take it due to her being on coumadin. She said the reason she has it, is because her son went to pick it up at the pharmacy but she said she wouldn't have picked it up had she gone to get it. Advised the patient to also let Dr. Erlinda Hong know she can't take that medication due to the interference with coumadin and to see if something else can be provided.

## 2019-11-16 NOTE — Telephone Encounter (Signed)
Called patient no answer. LMOM to return our call. ? ?

## 2019-11-22 ENCOUNTER — Telehealth: Payer: Self-pay | Admitting: Orthopaedic Surgery

## 2019-11-22 NOTE — Telephone Encounter (Signed)
Patient returned called advised she can not take Diclofenac because she is on a blood thinner. The number to contact patient is [phone] or [phone]

## 2019-11-22 NOTE — Telephone Encounter (Signed)
Okay thank you.  She should stick with Tylenol then.

## 2019-11-23 ENCOUNTER — Telehealth: Payer: Self-pay | Admitting: Orthopaedic Surgery

## 2019-11-23 MED ORDER — TRAMADOL HCL 50 MG PO TABS: 50.0000 mg | ORAL_TABLET | Freq: Every day | ORAL | 0 refills | Status: DC | PRN

## 2019-11-23 NOTE — Telephone Encounter (Signed)
I called patient and advised. 

## 2019-11-23 NOTE — Telephone Encounter (Signed)
Pt would like to have some pain medicine sent in that won't conflict with her BP medicine and doesn't have Asprin in it?  [phone]

## 2019-11-23 NOTE — Telephone Encounter (Signed)
Pt was advised yesterday to continue with her Tylenol for left foot pain. Last in office 11/15/19 she states that she would like rx sent to pharm that does not have ASA and that will not conflict with her BP meds. Please advise.

## 2019-11-25 ENCOUNTER — Telehealth: Payer: Self-pay | Admitting: Internal Medicine

## 2019-11-25 NOTE — Telephone Encounter (Signed)
Sent to [facility]

## 2019-11-25 NOTE — Telephone Encounter (Signed)
Patient aware of pharmacist's med advice

## 2019-11-25 NOTE — Telephone Encounter (Signed)
Yes, this is generally safe with warfarin. But doses greater than 2500mg  per day for several days in a row have the potential to increase INR. She can continue to take the tylenol and keep her appointment in the coumadin clinic next week

## 2019-11-25 NOTE — Telephone Encounter (Signed)
    Pt c/o medication issue:  1. Name of Medication: Arthritis Tylenol   2. How are you currently taking this medication (dosage and times per day)?   3. Are you having a reaction (difficulty breathing--STAT)?   4. What is your medication issue? Pt would like to know if its ok to take this medication, she said she is also taking   warfarin (COUMADIN) 5 MG tablet

## 2019-11-29 ENCOUNTER — Ambulatory Visit (INDEPENDENT_AMBULATORY_CARE_PROVIDER_SITE_OTHER): Payer: Medicare HMO

## 2019-11-29 DIAGNOSIS — Z0000 Encounter for general adult medical examination without abnormal findings: Secondary | ICD-10-CM | POA: Diagnosis not present

## 2019-11-29 NOTE — Progress Notes (Addendum)
I connected with [name] today by telephone and verified that I am speaking with the correct person using two identifiers. Location patient: home Location provider: work Persons participating in the virtual visit: [name] and Kelly Abu, LPN   I discussed the limitations, risks, security and privacy concerns of performing an evaluation and management service by telephone and the availability of in person appointments. I also discussed with the patient that there may be a patient responsible charge related to this service. The patient expressed understanding and verbally consented to this telephonic visit.    Interactive audio and video telecommunications were attempted between this provider and patient, however failed, due to patient having technical difficulties OR patient did not have access to video capability.  We continued and completed visit with audio only.  Some vital signs may be absent or patient reported.   Time Spent with patient on telephone encounter: 20 minutes  Subjective:   [name] is a 85 y.o. female who presents for Medicare Annual (Subsequent) preventive examination.  Review of Systems    No ROS. Medicare Wellness Virtual Visit. Additional risk factors are reflected in social history. Cardiac Risk Factors include: advanced age (>16mn, >>63women);diabetes mellitus;dyslipidemia;family history of premature cardiovascular disease;hypertension;obesity (BMI >30kg/m2)     Objective:    There were no vitals filed for this visit. There is no height or weight on file to calculate BMI.  Advanced Directives 11/29/2019 10/30/2019 11/25/2018 09/12/2018 10/29/2017  Does Patient Have a Medical Advance Directive? Yes No No No No  Type of Advance Directive Living will - - - -  Does patient want to make changes to medical advance directive? No - Patient declined - - - -  Would patient like information on creating a medical advance directive? - No - Patient  declined Yes (ED - Information included in AVS) - Yes (ED - Information included in AVS)    Current Medications (verified) Outpatient Encounter Medications as of 11/29/2019  Medication Sig   ACCU-CHEK FASTCLIX LANCETS MISC Use to check blood sugars twice a day Dx E11.9   Alcohol Swabs (B-D SINGLE USE SWABS REGULAR) PADS Use to clean injection site to check blood sugars daily   ALPRAZolam (XANAX) 0.5 MG tablet TAKE 1/2 TO 1 TABLET BY MOUTH THREE TIMES DAILY AS NEEDED FOR ANXIETY. DO NOT TAKE WITH TRAMADOL.THIS IS A 30 DAY SUPPLY   amLODipine (NORVASC) 5 MG tablet Take 1 tablet (5 mg total) by mouth daily.   aspirin 81 MG tablet Take 81 mg by mouth daily.     Blood Glucose Calibration (ACCU-CHEK AVIVA) SOLN Use to calibrate BS monitor   Blood Glucose Monitoring Suppl (ACCU-CHEK AVIVA PLUS) w/Device KIT Use to check blood sugars twice a day Dx E11.9   Blood Glucose Monitoring Suppl (ACCU-CHEK AVIVA PLUS) w/Device KIT Use to check blood sugars daily   Cholecalciferol (VITAMIN D3) 50 MCG (2000 UT) capsule Take 1 capsule (2,000 Units total) by mouth daily.   clidinium-chlordiazePOXIDE (LIBRAX) 5-2.5 MG capsule TAKE ONE CAPSULE BY MOUTH THREE TIMES DAILY AS NEEDED FOR CRAMPS   diazepam (VALIUM) 5 MG tablet Take by mouth.   diclofenac Sodium (VOLTAREN) 1 % GEL Apply 2 g topically 4 (four) times daily.   furosemide (LASIX) 20 MG tablet TAKE 1 TABLET(20 MG) BY MOUTH DAILY AS NEEDED FOR SWELLING   glucose blood (ACCU-CHEK AVIVA PLUS) test strip Use to check blood sugars twice a day   glucose blood (ACCU-CHEK AVIVA PLUS) test strip Use to check  blood sugars daily   hydroxypropyl methylcellulose (ISOPTO TEARS) 2.5 % ophthalmic solution Place 1 drop into both eyes 2 (two) times daily.     hydrOXYzine (ATARAX/VISTARIL) 25 MG tablet Take 1 tablet (25 mg total) by mouth every 8 (eight) hours as needed for itching.   isosorbide mononitrate (IMDUR) 30 MG 24 hr tablet TAKE 1 TABLET BY MOUTH EVERY DAY   Lancets  (ACCU-CHEK SOFT TOUCH) lancets Use as instructed   metoprolol succinate (TOPROL-XL) 50 MG 24 hr tablet TAKE 1 TABLET(50 MG) BY MOUTH TWICE DAILY   Nabumetone (RELAFEN DS) 1000 MG TABS Take 1 tablet by mouth daily as needed.   pantoprazole (PROTONIX) 40 MG tablet TAKE 1 TABLET BY MOUTH EVERY DAY   predniSONE (STERAPRED UNI-PAK 21 TAB) 5 MG (21) TBPK tablet Take as directed   simvastatin (ZOCOR) 20 MG tablet TAKE 1 TABLET BY MOUTH AT BEDTIME   traMADol (ULTRAM) 50 MG tablet Take 1 tablet (50 mg total) by mouth 3 (three) times daily.   traMADol (ULTRAM) 50 MG tablet Take 1-2 tablets (50-100 mg total) by mouth daily as needed.   triamcinolone cream (KENALOG) 0.1 % Apply 1 application topically 2 (two) times daily.   warfarin (COUMADIN) 5 MG tablet TAKE 1 TABLET BY MOUTH EVERY DAY AS DIRECTED BY COUMADIN CLINIC   No facility-administered encounter medications on file as of 11/29/2019.    Allergies (verified) Clarithromycin, Fosamax [alendronate sodium], Guaifenesin er, Metformin, and Tape   History: Past Medical History:  Diagnosis Date   Anxiety state, unspecified    Bronchitis, not specified as acute or chronic    Diaphragmatic hernia without mention of obstruction or gangrene    Dyslipidemia    Essential hypertension    Irritable bowel syndrome    Mitral valve disorders(424.0)    a. 08/2010 Echo: EF >55%, mild MR, mod TR, trace AI.   Non-obstructive CAD    a. 07/2010 Lexiscan MV: no ischemia, EF 78%; b. 07/2010 Cath: LM nl, LAD50/60p, 40 apical, LCX nl, RCA dominant, 30/40p, 4m   Osteoarthrosis, unspecified whether generalized or localized, unspecified site    Osteoporosis, unspecified    Other abnormal glucose    PAF (paroxysmal atrial fibrillation) (HCC)    a. CHA2DS2VASc = 4-->coumadin.   Phlebitis and thrombophlebitis of superficial vessels of lower extremities    PSVT (paroxysmal supraventricular tachycardia) ([facility]    a. 05/2012 Holter: short bursts of PSVT noted-->Managed with  toprol.   Unspecified venous (peripheral) insufficiency    Unspecified vitamin D deficiency    Past Surgical History:  Procedure Laterality Date   CARDIAC CATHETERIZATION  07/30/2010   nonobstructive CAD, 20-40% RCA stenosis, 50-60% eccentric LAD stenosis more prox, 40% LAD stenosis more distal, EF 65%   TRANSTHORACIC ECHOCARDIOGRAM  09/03/2010   EF=>55%; mild MR; mod TR; AV mildly sclerotic with trace regurg; mild pulm valve regurg   VESICOVAGINAL FISTULA CLOSURE W/ TAH     Family History  Problem Relation Age of Onset   Stroke Mother    Stroke Father        also heart disease   Clotting disorder Paternal Grandmother        blood clot   Stroke Brother        also heart disease   Social History   Socioeconomic History   Marital status: Widowed    Spouse name: Not on file   Number of children: 4   Years of education: Not on file   Highest education level: Not on file  Occupational History   Occupation: retired    Fish farm manager: RETIRED  Tobacco Use   Smoking status: Never Smoker   Smokeless tobacco: Never Used  Scientific laboratory technician Use: Never used  Substance and Sexual Activity   Alcohol use: No   Drug use: No   Sexual activity: Not Currently  Other Topics Concern   Not on file  Social History Narrative   Not on file   Social Determinants of Health   Financial Resource Strain: High Risk   Difficulty of Paying Living Expenses: Very hard  Food Insecurity: Food Insecurity Present   Worried About Charity fundraiser in the Last Year: Often true   Arboriculturist in the Last Year: Often true  Transportation Needs: No Transportation Needs   Lack of Transportation (Medical): No   Lack of Transportation (Non-Medical): No  Physical Activity: Insufficiently Active   Days of Exercise per Week: 3 days   Minutes of Exercise per Session: 30 min  Stress: No Stress Concern Present   Feeling of Stress : Not at all  Social Connections:    Frequency of Communication with Friends  and Family:    Frequency of Social Gatherings with Friends and Family:    Attends Religious Services:    Active Member of Clubs or Organizations:    Attends Music therapist:    Marital Status:     Tobacco Counseling Counseling given: Not Answered   Clinical Intake:  Pre-visit preparation completed: Yes  Pain : No/denies pain     Nutritional Risks: None Diabetes: Yes CBG done?: No Did pt. bring in CBG monitor from home?: No  How often do you need to have someone help you when you read instructions, pamphlets, or other written materials from your doctor or pharmacy?: 1 - Never What is the last grade level you completed in school?: 7th grade  Diabetic? yes  Interpreter Needed?: No  Information entered by :: Margan Elias N. Kenzli Barritt, LPN   Activities of Daily Living In your present state of health, do you have any difficulty performing the following activities: 11/29/2019  Hearing? Y  Comment left ear hearing loss  Vision? N  Difficulty concentrating or making decisions? Y  Walking or climbing stairs? N  Dressing or bathing? N  Doing errands, shopping? N  Preparing Food and eating ? N  Using the Toilet? N  In the past six months, have you accidently leaked urine? N  Do you have problems with loss of bowel control? N  Managing your Medications? N  Managing your Finances? N  Housekeeping or managing your Housekeeping? N  Some recent data might be hidden    Patient Care Team: Plotnikov, Evie Lacks, MD as PCP - General (Internal Medicine) Debara Pickett Nadean Corwin, MD as PCP - Cardiology (Cardiology) Warden Fillers, MD as Consulting Physician (Ophthalmology)  Indicate any recent Medical Services you may have received from other than [facility] providers in the past year (date may be approximate).     Assessment:   This is a routine wellness examination for [facility].  Hearing/Vision screen No exam data present  Dietary issues and exercise activities  discussed: Current Exercise Habits: The patient does not participate in regular exercise at present, Exercise limited by: cardiac condition(s)  Goals      Patient Stated     Continue to stay active in my church doing volunteer work and bible studies. Enjoy life and family.       Depression Screen PHQ 2/9 Scores  11/29/2019 11/29/2019 11/25/2018 10/29/2017 04/27/2017 03/18/2016 02/08/2015  PHQ - 2 Score 0 0 0 0 0 0 1  PHQ- 9 Score - - - 3 - - -    Fall Risk Fall Risk  11/29/2019 11/25/2018 11/09/2018 10/29/2017 04/27/2017  Falls in the past year? 0 0 0 No No  Number falls in past yr: 0 0 0 - -  Injury with Fall? 0 - 0 - -  Risk for fall due to : No Fall Risks Impaired balance/gait;Impaired mobility - - -  Follow up Falls evaluation completed Falls prevention discussed - - -    Any stairs in or around the home? No  If so, are there any without handrails? No  Home free of loose throw rugs in walkways, pet beds, electrical cords, etc? Yes  Adequate lighting in your home to reduce risk of falls? Yes   ASSISTIVE DEVICES UTILIZED TO PREVENT FALLS:  Life alert? No  Use of a cane, walker or w/c? No  Grab bars in the bathroom? No  Shower chair or bench in shower? No  Elevated toilet seat or a handicapped toilet? No   TIMED UP AND GO:  Was the test performed? No .  Length of time to ambulate 10 feet: 0 sec.   Gait slow and steady without use of assistive device (per patient)  Cognitive Function: MMSE - Mini Mental State Exam 10/29/2017  Orientation to time 5  Orientation to Place 5  Registration 3  Attention/ Calculation 4  Recall 1  Language- name 2 objects 2  Language- repeat 1  Language- follow 3 step command 3  Language- read & follow direction 1  Write a sentence 1  Copy design 1  Total score 27     6CIT Screen 11/25/2018  What Year? 0 points  What month? 0 points  What time? 0 points  Count back from 20 0 points  Months in reverse 4 points  Repeat phrase 4 points   Total Score 8    Immunizations Immunization History  Administered Date(s) Administered   Fluad Quad(high Dose 65+) 02/15/2019   Influenza Split 01/21/2011, 02/24/2012   Influenza Whole 02/02/2009, 05/21/2010   Influenza, High Dose Seasonal PF 03/18/2016, 03/31/2017, 02/10/2018   Influenza,inj,Quad PF,6+ Mos 01/11/2013, 05/01/2014, 02/08/2015    TDAP status: Due, Education has been provided regarding the importance of this vaccine. Advised may receive this vaccine at local pharmacy or Health Dept. Aware to provide a copy of the vaccination record if obtained from local pharmacy or Health Dept. Verbalized acceptance and understanding. Flu Vaccine status: Up to date Pneumococcal vaccine status: Declined,  Education has been provided regarding the importance of this vaccine but patient still declined. Advised may receive this vaccine at local pharmacy or Health Dept. Aware to provide a copy of the vaccination record if obtained from local pharmacy or Health Dept. Verbalized acceptance and understanding.  Covid-19 vaccine status: Completed vaccines: advised to bring documentation to next appointment.  Qualifies for Shingles Vaccine? Yes   Zostavax completed No   Shingrix Completed?: No.    Education has been provided regarding the importance of this vaccine. Patient has been advised to call insurance company to determine out of pocket expense if they have not yet received this vaccine. Advised may also receive vaccine at local pharmacy or Health Dept. Verbalized acceptance and understanding.  Screening Tests Health Maintenance  Topic Date Due   URINE MICROALBUMIN  Never done   COVID-19 Vaccine (1) Never done   TETANUS/TDAP  Never done   DEXA SCAN  Never done   PNA vac Low Risk Adult (1 of 2 - PCV13) Never done   OPHTHALMOLOGY EXAM  04/17/2018   FOOT EXAM  10/30/2018   INFLUENZA VACCINE  11/13/2019    Health Maintenance  Health Maintenance Due  Topic Date Due   URINE MICROALBUMIN   Never done   COVID-19 Vaccine (1) Never done   TETANUS/TDAP  Never done   DEXA SCAN  Never done   PNA vac Low Risk Adult (1 of 2 - PCV13) Never done   OPHTHALMOLOGY EXAM  04/17/2018   FOOT EXAM  10/30/2018   INFLUENZA VACCINE  11/13/2019    Colorectal cancer screening: No longer required.  Mammogram status: Completed 03/23/2019. Repeat every year   Lung Cancer Screening: (Low Dose CT Chest recommended if Age 7-80 years, 30 pack-year currently smoking OR have quit w/in 15years.) does not qualify.   Lung Cancer Screening Referral: no  Additional Screening:  Hepatitis C Screening: does not qualify; Completed no  Vision Screening: Recommended annual ophthalmology exams for early detection of glaucoma and other disorders of the eye. Is the patient up to date with their annual eye exam?  No  Who is the provider or what is the name of the office in which the patient attends annual eye exams? Midge Aver, MD (missed appointment due to pandemic, patient will schedule for 2021) If pt is not established with a provider, would they like to be referred to a provider to establish care? No .   Dental Screening: Recommended annual dental exams for proper oral hygiene  Community Resource Referral / Chronic Care Management: CRR required this visit?  Yes   CCM required this visit?  Yes      Plan:     I have personally reviewed and noted the following in the patient's chart:   Medical and social history Use of alcohol, tobacco or illicit drugs  Current medications and supplements Functional ability and status Nutritional status Physical activity Advanced directives List of other physicians Hospitalizations, surgeries, and ER visits in previous 12 months Vitals Screenings to include cognitive, depression, and falls Referrals and appointments  In addition, I have reviewed and discussed with patient certain preventive protocols, quality metrics, and best practice recommendations. A  written personalized care plan for preventive services as well as general preventive health recommendations were provided to patient.     Sheral Flow, LPN   [id number]   Nurse Notes:  Patient is cogitatively intact. There were no vitals filed for this visit. There is no height or weight on file to calculate BMI. Patient stated that she has no issues with gait or balance; does not use any assistive devices. Referrals have been placed with: [id number] and [employer] for Financial Strain and Food Insecurity.  Also patient is in need of getting some grab bars placed in her bathroom and a smaller tub chair to help with support and safety.   Medical screening examination/treatment/procedure(s) were performed by non-physician practitioner and as supervising physician I was immediately available for consultation/collaboration.  I agree with above. Lew Dawes, MD

## 2019-11-29 NOTE — Patient Instructions (Addendum)
[name] , Thank you for taking time to come for your Medicare Wellness Visit. I appreciate your ongoing commitment to your health goals. Please review the following plan we discussed and let me know if I can assist you in the future.   Screening recommendations/referrals: Colonoscopy: not a candidate for colon cancer screening due to age 85: 03/23/2019 Bone Density: never done Recommended yearly ophthalmology/optometry visit for glaucoma screening and checkup Recommended yearly dental visit for hygiene and checkup  Vaccinations: Influenza vaccine: 02/15/2019 Pneumococcal vaccine: declined Tdap vaccine: declined Shingles vaccine: declined   Covid-19: need documentation; will bring to next appt.  Advanced directives: Please bring a copy of your health care power of attorney and living will to the office at your convenience.  Conditions/risks identified: Yes; Please continue to do your personal lifestyle choices by: daily care of teeth and gums, regular physical activity (goal should be 5 days a week for 30 minutes), eat a healthy diet, avoid tobacco and drug use, limiting any alcohol intake, taking a low-dose aspirin (if not allergic or have been advised by your provider otherwise) and taking vitamins and minerals as recommended by your provider. Continue doing brain stimulating activities (puzzles, reading, adult coloring books, staying active) to keep memory sharp. Continue to eat heart healthy diet (full of fruits, vegetables, whole grains, lean protein, water--limit salt, fat, and sugar intake) and increase physical activity as tolerated.   Next appointment: Please schedule your next Medicare Wellness Visit with your Nurse Health Advisor in 1 year.   Preventive Care 25 Years and Older, Female Preventive care refers to lifestyle choices and visits with your health care provider that can promote health and wellness. What does preventive care include?  A yearly physical exam.  This is also called an annual well check.  Dental exams once or twice a year.  Routine eye exams. Ask your health care provider how often you should have your eyes checked.  Personal lifestyle choices, including:  Daily care of your teeth and gums.  Regular physical activity.  Eating a healthy diet.  Avoiding tobacco and drug use.  Limiting alcohol use.  Practicing safe sex.  Taking low-dose aspirin every day.  Taking vitamin and mineral supplements as recommended by your health care provider. What happens during an annual well check? The services and screenings done by your health care provider during your annual well check will depend on your age, overall health, lifestyle risk factors, and family history of disease. Counseling  Your health care provider may ask you questions about your:  Alcohol use.  Tobacco use.  Drug use.  Emotional well-being.  Home and relationship well-being.  Sexual activity.  Eating habits.  History of falls.  Memory and ability to understand (cognition).  Work and work Statistician.  Reproductive health. Screening  You may have the following tests or measurements:  Height, weight, and BMI.  Blood pressure.  Lipid and cholesterol levels. These may be checked every 5 years, or more frequently if you are over 21 years old.  Skin check.  Lung cancer screening. You may have this screening every year starting at age 52 if you have a 30-pack-year history of smoking and currently smoke or have quit within the past 15 years.  Fecal occult blood test (FOBT) of the stool. You may have this test every year starting at age 14.  Flexible sigmoidoscopy or colonoscopy. You may have a sigmoidoscopy every 5 years or a colonoscopy every 10 years starting at age 10.  Hepatitis C  blood test.  Hepatitis B blood test.  Sexually transmitted disease (STD) testing.  Diabetes screening. This is done by checking your blood sugar (glucose) after  you have not eaten for a while (fasting). You may have this done every 1-3 years.  Bone density scan. This is done to screen for osteoporosis. You may have this done starting at age 82.  Mammogram. This may be done every 1-2 years. Talk to your health care provider about how often you should have regular mammograms. Talk with your health care provider about your test results, treatment options, and if necessary, the need for more tests. Vaccines  Your health care provider may recommend certain vaccines, such as:  Influenza vaccine. This is recommended every year.  Tetanus, diphtheria, and acellular pertussis (Tdap, Td) vaccine. You may need a Td booster every 10 years.  Zoster vaccine. You may need this after age 43.  Pneumococcal 13-valent conjugate (PCV13) vaccine. One dose is recommended after age 59.  Pneumococcal polysaccharide (PPSV23) vaccine. One dose is recommended after age 53. Talk to your health care provider about which screenings and vaccines you need and how often you need them. This information is not intended to replace advice given to you by your health care provider. Make sure you discuss any questions you have with your health care provider. Document Released: 04/27/2015 Document Revised: 12/19/2015 Document Reviewed: 01/30/2015 Elsevier Interactive Patient Education  2017 [place] Prevention in the Home Falls can cause injuries. They can happen to people of all ages. There are many things you can do to make your home safe and to help prevent falls. What can I do on the outside of my home?  Regularly fix the edges of walkways and driveways and fix any cracks.  Remove anything that might make you trip as you walk through a door, such as a raised step or threshold.  Trim any bushes or trees on the path to your home.  Use bright outdoor lighting.  Clear any walking paths of anything that might make someone trip, such as rocks or tools.  Regularly  check to see if handrails are loose or broken. Make sure that both sides of any steps have handrails.  Any raised decks and porches should have guardrails on the edges.  Have any leaves, snow, or ice cleared regularly.  Use sand or salt on walking paths during winter.  Clean up any spills in your garage right away. This includes oil or grease spills. What can I do in the bathroom?  Use night lights.  Install grab bars by the toilet and in the tub and shower. Do not use towel bars as grab bars.  Use non-skid mats or decals in the tub or shower.  If you need to sit down in the shower, use a plastic, non-slip stool.  Keep the floor dry. Clean up any water that spills on the floor as soon as it happens.  Remove soap buildup in the tub or shower regularly.  Attach bath mats securely with double-sided non-slip rug tape.  Do not have throw rugs and other things on the floor that can make you trip. What can I do in the bedroom?  Use night lights.  Make sure that you have a light by your bed that is easy to reach.  Do not use any sheets or blankets that are too big for your bed. They should not hang down onto the floor.  Have a firm chair that has side arms. You  can use this for support while you get dressed.  Do not have throw rugs and other things on the floor that can make you trip. What can I do in the kitchen?  Clean up any spills right away.  Avoid walking on wet floors.  Keep items that you use a lot in easy-to-reach places.  If you need to reach something above you, use a strong step stool that has a grab bar.  Keep electrical cords out of the way.  Do not use floor polish or wax that makes floors slippery. If you must use wax, use non-skid floor wax.  Do not have throw rugs and other things on the floor that can make you trip. What can I do with my stairs?  Do not leave any items on the stairs.  Make sure that there are handrails on both sides of the stairs and  use them. Fix handrails that are broken or loose. Make sure that handrails are as long as the stairways.  Check any carpeting to make sure that it is firmly attached to the stairs. Fix any carpet that is loose or worn.  Avoid having throw rugs at the top or bottom of the stairs. If you do have throw rugs, attach them to the floor with carpet tape.  Make sure that you have a light switch at the top of the stairs and the bottom of the stairs. If you do not have them, ask someone to add them for you. What else can I do to help prevent falls?  Wear shoes that:  Do not have high heels.  Have rubber bottoms.  Are comfortable and fit you well.  Are closed at the toe. Do not wear sandals.  If you use a stepladder:  Make sure that it is fully opened. Do not climb a closed stepladder.  Make sure that both sides of the stepladder are locked into place.  Ask someone to hold it for you, if possible.  Clearly mark and make sure that you can see:  Any grab bars or handrails.  First and last steps.  Where the edge of each step is.  Use tools that help you move around (mobility aids) if they are needed. These include:  Canes.  Walkers.  Scooters.  Crutches.  Turn on the lights when you go into a dark area. Replace any light bulbs as soon as they burn out.  Set up your furniture so you have a clear path. Avoid moving your furniture around.  If any of your floors are uneven, fix them.  If there are any pets around you, be aware of where they are.  Review your medicines with your doctor. Some medicines can make you feel dizzy. This can increase your chance of falling. Ask your doctor what other things that you can do to help prevent falls. This information is not intended to replace advice given to you by your health care provider. Make sure you discuss any questions you have with your health care provider. Document Released: 01/25/2009 Document Revised: 09/06/2015 Document  Reviewed: 05/05/2014 Elsevier Interactive Patient Education  2017 [employer].

## 2019-11-30 ENCOUNTER — Telehealth: Payer: Self-pay | Admitting: Internal Medicine

## 2019-12-01 ENCOUNTER — Ambulatory Visit: Payer: Medicare HMO | Admitting: Orthopaedic Surgery

## 2019-12-01 ENCOUNTER — Encounter: Payer: Self-pay | Admitting: Physician Assistant

## 2019-12-01 DIAGNOSIS — M25572 Pain in left ankle and joints of left foot: Secondary | ICD-10-CM | POA: Diagnosis not present

## 2019-12-01 NOTE — Progress Notes (Signed)
Office Visit Note   Patient: [name]           Date of Birth: [birth year]           MRN: [id number] Visit Date: 12/01/2019              Requested by: Cassandria Anger, MD [place],  [place] [address] PCP: Plotnikov, Evie Lacks, MD   Assessment & Plan: Visit Diagnoses:  1. Pain in left ankle and joints of left foot     Plan: Impression is resolving left foot/ankle pain.  Patient will continue to increase activity as tolerated.  She will rest her foot should she have increased pain.  She will continue with Voltaren gel and Tylenol as needed.  Follow-up with [place] as needed.  Follow-Up Instructions: Return if symptoms worsen or fail to improve.   Orders:  No orders of the defined types were placed in this encounter.  No orders of the defined types were placed in this encounter.     Procedures: No procedures performed   Clinical Data: No additional findings.   Subjective: Chief Complaint  Patient presents with  . Left Foot - Pain    HPI patient is a pleasant 85 year old female who comes in today for follow-up of her left foot/ankle pain.  She was seen by [place] for this recently where it was noted that she likely had some form metatarsalgia and possible Morton's neuroma.  She has been wearing a cam walker and most recently weaned out of this.  She states that her foot is doing much better.  She still has slight soreness to the metatarsal heads but nothing more.  She has been taking Tylenol arthritis as needed.  Review of Systems as detailed in HPI.  All other reviewed and are negative.   Objective: Vital Signs: There were no vitals taken for this visit.  Physical Exam well-developed and well-nourished female in no acute distress.  Alert and oriented x3.  Ortho Exam examination of her left foot shows no swelling.  Mild tenderness to the metatarsal heads.  Mild tenderness to the peroneal tendon.  Full range of motion without pain.  She is  neurovascular intact distally.  Specialty Comments:  No specialty comments available.  Imaging: No new imaging   PMFS History: Patient Active Problem List   Diagnosis Date Noted  . Peroneal tendinitis 11/01/2019  . Spontaneous rupture of extensor tendon of left foot 10/28/2019  . Contusion of left foot 10/27/2019  . Sinusitis 04/13/2019  . Rash 12/05/2016  . Dyspnea 04/24/2016  . Tricuspid valve insufficiency 04/24/2016  . Cerumen impaction 11/19/2015  . Ear discomfort 11/13/2015  . Grief at loss of child 09/14/2015  . Well adult exam 09/14/2015  . Dyslipidemia   . PSVT (paroxysmal supraventricular tachycardia) ([place])   . PAF (paroxysmal atrial fibrillation) ([place])   . Viral gastroenteritis 07/21/2015  . Acute upper respiratory infection 04/17/2015  . Edema 11/08/2014  . Chronic venous insufficiency 11/08/2014  . Diarrhea 05/01/2014  . Mass of left thigh 12/14/2013  . Microhematuria 12/14/2013  . Long term (current) use of anticoagulants 08/19/2013  . CAD (coronary artery disease) 05/21/2011  . Atrial tachycardia ([place]) 11/19/2010  . CHEST PAIN 08/07/2009  . Vitamin D deficiency 04/19/2008  . SUPERFICIAL THROMBOPHLEBITIS 10/20/2007  . DM2 (diabetes mellitus, type 2) ([place]) 03/16/2007  . HYPERCHOLESTEROLEMIA 03/15/2007  . Anxiety 03/15/2007  . Essential hypertension 03/15/2007  . Mitral valve disorder 03/15/2007  . BRONCHITIS 03/15/2007  .  HIATAL HERNIA 03/15/2007  . Irritable bowel syndrome 03/15/2007  . Osteoarthritis 03/15/2007  . Osteoporosis 03/15/2007   Past Medical History:  Diagnosis Date  . Anxiety state, unspecified   . Bronchitis, not specified as acute or chronic   . Diaphragmatic hernia without mention of obstruction or gangrene   . Dyslipidemia   . Essential hypertension   . Irritable bowel syndrome   . Mitral valve disorders(424.0)    a. 08/2010 Echo: EF >55%, mild MR, mod TR, trace AI.  [name] Non-obstructive CAD    a. 07/2010 Lexiscan MV: no ischemia,  EF 78%; b. 07/2010 Cath: LM nl, LAD50/60p, 40 apical, LCX nl, RCA dominant, 30/40p, 74m.  . Osteoarthrosis, unspecified whether generalized or localized, unspecified site   . Osteoporosis, unspecified   . Other abnormal glucose   . PAF (paroxysmal atrial fibrillation) ([facility])    a. CHA2DS2VASc = 4-->coumadin.  [name] Phlebitis and thrombophlebitis of superficial vessels of lower extremities   . PSVT (paroxysmal supraventricular tachycardia) ([place])    a. 05/2012 Holter: short bursts of PSVT noted-->Managed with toprol.  [name] Unspecified venous (peripheral) insufficiency   . Unspecified vitamin D deficiency     Family History  Problem Relation Age of Onset  . Stroke Mother   . Stroke Father        also heart disease  . Clotting disorder Paternal Grandmother        blood clot  . Stroke Brother        also heart disease    Past Surgical History:  Procedure Laterality Date  . CARDIAC CATHETERIZATION  07/30/2010   nonobstructive CAD, 20-40% RCA stenosis, 50-60% eccentric LAD stenosis more prox, 40% LAD stenosis more distal, EF 65%  . TRANSTHORACIC ECHOCARDIOGRAM  09/03/2010   EF=>55%; mild MR; mod TR; AV mildly sclerotic with trace regurg; mild pulm valve regurg  . VESICOVAGINAL FISTULA CLOSURE W/ TAH     Social History   Occupational History  . Occupation: retired    Fish farm manager: RETIRED  Tobacco Use  . Smoking status: Never Smoker  . Smokeless tobacco: Never Used  Vaping Use  . Vaping Use: Never used  Substance and Sexual Activity  . Alcohol use: No  . Drug use: No  . Sexual activity: Not Currently

## 2019-12-02 ENCOUNTER — Other Ambulatory Visit: Payer: Self-pay

## 2019-12-02 ENCOUNTER — Ambulatory Visit (INDEPENDENT_AMBULATORY_CARE_PROVIDER_SITE_OTHER): Payer: Medicare HMO

## 2019-12-02 DIAGNOSIS — I480 Paroxysmal atrial fibrillation: Secondary | ICD-10-CM | POA: Diagnosis not present

## 2019-12-02 DIAGNOSIS — Z7901 Long term (current) use of anticoagulants: Secondary | ICD-10-CM | POA: Diagnosis not present

## 2019-12-02 LAB — POCT INR: INR: 2.9 (ref 2.0–3.0)

## 2019-12-02 NOTE — Patient Instructions (Signed)
Continue with 1 tablet daily.  Repeat INR in 5 weeks.    

## 2019-12-06 ENCOUNTER — Telehealth: Payer: Self-pay | Admitting: Internal Medicine

## 2019-12-12 ENCOUNTER — Other Ambulatory Visit: Payer: Self-pay | Admitting: Physician Assistant

## 2019-12-15 ENCOUNTER — Other Ambulatory Visit: Payer: Self-pay | Admitting: Internal Medicine

## 2019-12-16 NOTE — Telephone Encounter (Signed)
[place] Controlled Database Checked Last filled: 10/20/2019 (90) LOV w/you: 11/01/2019 Next appt w/you: 02/07/2020

## 2019-12-18 ENCOUNTER — Other Ambulatory Visit: Payer: Self-pay | Admitting: Internal Medicine

## 2019-12-24 ENCOUNTER — Other Ambulatory Visit: Payer: Self-pay | Admitting: Internal Medicine

## 2019-12-24 DIAGNOSIS — I480 Paroxysmal atrial fibrillation: Secondary | ICD-10-CM

## 2019-12-27 ENCOUNTER — Encounter: Payer: Self-pay | Admitting: Internal Medicine

## 2019-12-27 ENCOUNTER — Other Ambulatory Visit: Payer: Self-pay

## 2019-12-27 ENCOUNTER — Ambulatory Visit (INDEPENDENT_AMBULATORY_CARE_PROVIDER_SITE_OTHER): Payer: Medicare HMO | Admitting: Pharmacist Clinician (PhC)/ Clinical Pharmacy Specialist

## 2019-12-27 ENCOUNTER — Ambulatory Visit: Payer: Medicare HMO | Admitting: Internal Medicine

## 2019-12-27 VITALS — BP 134/80 | HR 76 | Ht 66.0 in | Wt 185.0 lb

## 2019-12-27 DIAGNOSIS — I480 Paroxysmal atrial fibrillation: Secondary | ICD-10-CM | POA: Diagnosis not present

## 2019-12-27 DIAGNOSIS — I10 Essential (primary) hypertension: Secondary | ICD-10-CM | POA: Diagnosis not present

## 2019-12-27 DIAGNOSIS — Z7901 Long term (current) use of anticoagulants: Secondary | ICD-10-CM | POA: Diagnosis not present

## 2019-12-27 DIAGNOSIS — E7800 Pure hypercholesterolemia, unspecified: Secondary | ICD-10-CM | POA: Diagnosis not present

## 2019-12-27 LAB — POCT INR: INR: 2.7 (ref 2.0–3.0)

## 2019-12-27 NOTE — Patient Instructions (Signed)
Medication Instructions:  Your physician recommends that you continue on your current medications as directed. Please refer to the Current Medication list given to you today.  Dr. Debara Pickett said it is OK to take over-the-counter magnesium for cramping   *If you need a refill on your cardiac medications before your next appointment, please call your pharmacy*   Follow-Up: At [facility], you and your health needs are our priority.  As part of our continuing mission to provide you with exceptional heart care, we have created designated Provider Care Teams.  These Care Teams include your primary Cardiologist (physician) and Advanced Practice Providers (APPs -  Physician Assistants and Nurse Practitioners) who all work together to provide you with the care you need, when you need it.  We recommend signing up for the patient portal called "MyChart".  Sign up information is provided on this After Visit Summary.  MyChart is used to connect with patients for Virtual Visits (Telemedicine).  Patients are able to view lab/test results, encounter notes, upcoming appointments, etc.  Non-urgent messages can be sent to your provider as well.   To learn more about what you can do with MyChart, go to [email or web address].    Your next appointment:   12 month(s)  The format for your next appointment:   In Person  Provider:   You may see Pixie Casino, MD or one of the following Advanced Practice Providers on your designated Care Team:    Almyra Deforest, PA-C  Fabian Sharp, PA-C or   Roby Lofts, [place]    Other Instructions

## 2019-12-27 NOTE — Progress Notes (Signed)
OFFICE NOTE  Chief Complaint:  No complaints  Primary Care Physician: Cassandria Anger, MD  HPI:  [name]  is a 85 year old female with a history of paroxysmal atrial fibrillation and SVT in the past. This happened when she underwent a Myoview for chest pain in the hospital. She was placed on metoprolol succinate 25 b.i.d. and has done well with that with improvement in her palpitations. Her echo showed an EF greater than 55% with some mild aortic insufficiency and an aneurysmal atrial septum. She is on simvastatin as well as Coumadin managed by you. She's recently had some abnormal labs which showed borderline diabetes. She's not currently a medication for that. She reports occasional intermittent chest pain. She underwent a cardia catheterization in the past which showed mild to moderate nonobstructive coronary disease which is managed medically. She does also have bilateral venous insufficiency of the lower chin and knees and has worn compression stockings with improvement in her swelling and symptoms.  [name] returns today without any new complaints. She denies any shortness of breath or chest pain. She does get some occasional chest wall pain and is relieved with tramadol.  I saw [name] back in the office today. Overall she reports she is doing very well. She's had very few episodes of palpitations. She has had a decreased dose in her amlodipine to 5 mg daily, likely due to leg swelling. She is now taking Lasix as needed for edema. She's had a few episodes of racing heart for which she is to perform vagal maneuvers successfully. EKG shows sinus rhythm with PVCs and PACs but no evidence of atrial fibrillation in the office today. She is on warfarin anticoagulation. We will check the INR in the office today.  [name] returns today for follow-up. Unfortunately her son died this past 06/17/22 of leukemia. She says he's been in and out of the hospital for several  months. She has had it small increase in palpitations recently but seems to be controlled with taking and a half dose of metoprolol. She denies any chest pain although has some pain behind her right shoulder blade. This is intermittent and has improved. Is not associated with exertion or relieved by rest. Her EKG today shows normal sinus rhythm without ischemia. Recent A1c was 6.8-she is not on diabetes medications. I do not see a recent lipid profile though she is on simvastatin 20 mg every OD.  04/24/2016  [name] returns today for follow-up. She has no new complaints. It's been about a year since she had the loss of her son due to leukemia. She seems to be doing fairly well although is routinely very calm and demeanor. Her blood pressure is well-controlled. Her INR was therapeutic today. She denies any worsening swelling, chest pain or shortness of breath. She denies any bleeding problems. She's not had any recurrent atrial fibrillation or palpitations that she is aware of. Her last echo was in 2012 and at the time she had mild nonobstructive coronary disease by catheterization. She was also noted to have moderate tricuspid regurgitation and some LVH. Her EKG today shows moderate LVH by voltage criteria.  04/28/2017  [name] returns today for follow-up.  She denies any chest pain or worsening shortness of breath.  She saw her primary care provider yesterday no medicine changes were made.  She is contemplating removing a small mass on the left inner thigh.  He has an appointment with a surgeon coming up and we may  need to hold her warfarin for that.  She has had no significant recurrent atrial fibrillation.  She is in sinus today.  She prefers to stay on warfarin.  INR has been therapeutic and is adjusted every 6 weeks.  04/29/2018  [name] seen today for routine annual follow-up.  Overall she is without complaints.  Her warfarin testing has been stable.  Blood pressures well controlled  today.  She denies chest pain or worsening shortness of breath.  Cholesterol has been at goal.  EKG personally reviewed shows sinus bradycardia and voltage criteria for LVH at 62.  04/2018  [name] seen today for follow-up.  She is accompanied by her daughter.  Overall she seems to be doing well.  She denies any chest pain or worsening shortness of breath.  She denies any symptomatic atrial fibrillation.  She remains on warfarin for anticoagulation.  She also takes metoprolol succinate which we recently increased to 50 mg twice daily for palpitations.  She was in the ER with A. fib and RVR.  She is noted since increasing her beta-blocker that her palpitations have improved.  12/27/2019  [name] returns for follow-up.  Overall she is doing well.  She has been having some complaints about a tendinitis in her left foot.  Otherwise blood pressure is controlled.  She has rare palpitations.  She is due for repeat labs although sees her primary care provider next month.  PMHx:  Past Medical History:  Diagnosis Date  . Anxiety state, unspecified   . Bronchitis, not specified as acute or chronic   . Diaphragmatic hernia without mention of obstruction or gangrene   . Dyslipidemia   . Essential hypertension   . Irritable bowel syndrome   . Mitral valve disorders(424.0)    a. 08/2010 Echo: EF >55%, mild MR, mod TR, trace AI.  [name] Non-obstructive CAD    a. 07/2010 Lexiscan MV: no ischemia, EF 78%; b. 07/2010 Cath: LM nl, LAD50/60p, 40 apical, LCX nl, RCA dominant, 30/40p, 2m  . Osteoarthrosis, unspecified whether generalized or localized, unspecified site   . Osteoporosis, unspecified   . Other abnormal glucose   . PAF (paroxysmal atrial fibrillation) (HCC)    a. CHA2DS2VASc = 4-->coumadin.  .Marland KitchenPhlebitis and thrombophlebitis of superficial vessels of lower extremities   . PSVT (paroxysmal supraventricular tachycardia) ([facility]    a. 05/2012 Holter: short bursts of PSVT noted-->Managed with toprol.   .Marland KitchenUnspecified venous (peripheral) insufficiency   . Unspecified vitamin D deficiency     Past Surgical History:  Procedure Laterality Date  . CARDIAC CATHETERIZATION  07/30/2010   nonobstructive CAD, 20-40% RCA stenosis, 50-60% eccentric LAD stenosis more prox, 40% LAD stenosis more distal, EF 65%  . TRANSTHORACIC ECHOCARDIOGRAM  09/03/2010   EF=>55%; mild MR; mod TR; AV mildly sclerotic with trace regurg; mild pulm valve regurg  . VESICOVAGINAL FISTULA CLOSURE W/ TAH      FAMHx:  Family History  Problem Relation Age of Onset  . Stroke Mother   . Stroke Father        also heart disease  . Clotting disorder Paternal Grandmother        blood clot  . Stroke Brother        also heart disease    SOCHx:   reports that she has never smoked. She has never used smokeless tobacco. She reports that she does not drink alcohol and does not use drugs.  ALLERGIES:  Allergies  Allergen Reactions  . Clarithromycin  REACTION: unspecified  . Fosamax [Alendronate Sodium]     achy  . Guaifenesin Er     itching  . Metformin     REACTION: pt states INTOL to Metformin "felt drained"  . Tape     Adhesive tape---rash    ROS: Pertinent items noted in HPI and remainder of comprehensive ROS otherwise negative.  HOME MEDS: Current Outpatient Medications  Medication Sig Dispense Refill  . ACCU-CHEK FASTCLIX LANCETS MISC Use to check blood sugars twice a day Dx E11.9 102 each 2  . Alcohol Swabs (B-D SINGLE USE SWABS REGULAR) PADS Use to clean injection site to check blood sugars daily 300 each 3  . ALPRAZolam (XANAX) 0.5 MG tablet TAKE 1/2 TO 1 TABLET BY MOUTH THREE TIMES DAILY AS NEEDED FOR ANXIETY. DO NOT TAKE WITH TRAMADOL.THIS IS A 30 DAY SUPPLY 90 tablet 1  . ALPRAZolam (XANAX) 0.5 MG tablet TAKE 1/2 TO 1 TABLET BY MOUTH THREE TIMES DAILY AS NEEDED FOR ANXIETY. DO NOT TAKE WITH TRAMADOL.THIS IS A 30 DAY SUPPLY 90 tablet 3  . amLODipine (NORVASC) 5 MG tablet Take 1 tablet (5 mg total) by  mouth daily. 90 tablet 3  . aspirin 81 MG tablet Take 81 mg by mouth daily.      . Blood Glucose Calibration (ACCU-CHEK AVIVA) SOLN Use to calibrate BS monitor 3 each 3  . Blood Glucose Monitoring Suppl (ACCU-CHEK AVIVA PLUS) w/Device KIT Use to check blood sugars twice a day Dx E11.9 1 kit 0  . Blood Glucose Monitoring Suppl (ACCU-CHEK AVIVA PLUS) w/Device KIT Use to check blood sugars daily 1 kit 0  . Cholecalciferol (VITAMIN D3) 50 MCG (2000 UT) capsule Take 1 capsule (2,000 Units total) by mouth daily. 100 capsule 3  . clidinium-chlordiazePOXIDE (LIBRAX) 5-2.5 MG capsule TAKE ONE CAPSULE BY MOUTH THREE TIMES DAILY AS NEEDED FOR CRAMPS 20 capsule 2  . diazepam (VALIUM) 5 MG tablet Take by mouth.    . diclofenac Sodium (VOLTAREN) 1 % GEL Apply 2 g topically 4 (four) times daily. 150 g 1  . furosemide (LASIX) 20 MG tablet TAKE 1 TABLET(20 MG) BY MOUTH DAILY AS NEEDED FOR SWELLING 30 tablet 3  . glucose blood (ACCU-CHEK AVIVA PLUS) test strip Use to check blood sugars twice a day 100 each 2  . glucose blood (ACCU-CHEK AVIVA PLUS) test strip Use to check blood sugars daily 300 each 3  . hydroxypropyl methylcellulose (ISOPTO TEARS) 2.5 % ophthalmic solution Place 1 drop into both eyes 2 (two) times daily.      . hydrOXYzine (ATARAX/VISTARIL) 25 MG tablet Take 1 tablet (25 mg total) by mouth every 8 (eight) hours as needed for itching. 60 tablet 0  . isosorbide mononitrate (IMDUR) 30 MG 24 hr tablet TAKE 1 TABLET BY MOUTH EVERY DAY 30 tablet 11  . Lancets (ACCU-CHEK SOFT TOUCH) lancets Use as instructed 300 each 3  . metoprolol succinate (TOPROL-XL) 50 MG 24 hr tablet TAKE 1 TABLET(50 MG) BY MOUTH TWICE DAILY 180 tablet 1  . Nabumetone (RELAFEN DS) 1000 MG TABS Take 1 tablet by mouth daily as needed. 6 tablet 0  . pantoprazole (PROTONIX) 40 MG tablet TAKE 1 TABLET BY MOUTH EVERY DAY 90 tablet 3  . simvastatin (ZOCOR) 20 MG tablet TAKE 1 TABLET BY MOUTH AT BEDTIME 90 tablet 3  . traMADol (ULTRAM) 50  MG tablet Take 1 tablet (50 mg total) by mouth 3 (three) times daily. 90 tablet 3  . triamcinolone cream (KENALOG) 0.1 % Apply  1 application topically 2 (two) times daily. 80 g 0  . warfarin (COUMADIN) 5 MG tablet TAKE 1 TABLET BY MOUTH EVERY DAY AS DIRECTED BY COUMADIN CLINIC 90 tablet 0   No current facility-administered medications for this visit.    LABS/IMAGING: No results found for this or any previous visit (from the past 48 hour(s)). No results found.  VITALS: BP 134/80   Pulse 76   Ht _0  (1.676 m)   Wt 185 lb (83.9 kg)   SpO2 98%   BMI 29.86 kg/m   EXAM: General appearance: alert and no distress Neck: no carotid bruit, no JVD and thyroid not enlarged, symmetric, no tenderness/mass/nodules Lungs: clear to auscultation bilaterally Heart: regular rate and rhythm, S1, S2 normal, no murmur, click, rub or gallop Abdomen: soft, non-tender; bowel sounds normal; no masses,  no organomegaly Extremities: extremities normal, atraumatic, no cyanosis or edema Pulses: 2+ and symmetric Skin: Skin color, texture, turgor normal. No rashes or lesions Neurologic: Grossly normal Psych: Pleasant  EKG: Deferred  ASSESSMENT: 1. Paroxysmal atrial fibrillation on warfarin - CHADSVASC score of 4 2. Hypertension-controlled 3. Dyslipidemia-on statin 4. Overweight 5. Moderate, non-obstructive CAD by cath in 2012 6. PSVT - controlled by b-blocker and vagal maneuvers 7. LVH, mild to moderate TR by echo 2012  PLAN: 1.   [name] seems to be doing well without any evidence of recurrent atrial fibrillation.  Heart rate is well controlled today.  Blood pressure is at goal.  Weight has been stable however could be lower.  No acute cardiac issues.  No medication changes were made.  Plan follow-up with me annually or sooner as necessary.Pixie Casino, MD, [facility], [place] Director of the Advanced Lipid Disorders &  Cardiovascular Risk Reduction  Clinic Diplomate of the American Board of Clinical Lipidology Attending Cardiologist  Direct Dial: [phone]  Fax: [phone]  Website:  [email or web address] 12/27/2019, 11:35 AM

## 2020-02-07 ENCOUNTER — Encounter: Payer: Self-pay | Admitting: Internal Medicine

## 2020-02-07 ENCOUNTER — Other Ambulatory Visit: Payer: Self-pay

## 2020-02-07 ENCOUNTER — Ambulatory Visit (INDEPENDENT_AMBULATORY_CARE_PROVIDER_SITE_OTHER): Payer: Medicare HMO | Admitting: Internal Medicine

## 2020-02-07 VITALS — BP 128/70 | HR 72 | Temp 98.5°F | Wt 189.6 lb

## 2020-02-07 DIAGNOSIS — E119 Type 2 diabetes mellitus without complications: Secondary | ICD-10-CM

## 2020-02-07 DIAGNOSIS — I480 Paroxysmal atrial fibrillation: Secondary | ICD-10-CM | POA: Diagnosis not present

## 2020-02-07 DIAGNOSIS — I10 Essential (primary) hypertension: Secondary | ICD-10-CM

## 2020-02-07 DIAGNOSIS — Z23 Encounter for immunization: Secondary | ICD-10-CM | POA: Diagnosis not present

## 2020-02-07 LAB — BASIC METABOLIC PANEL WITH GFR
BUN: 13 mg/dL (ref 6–23)
CO2: 28 meq/L (ref 19–32)
Calcium: 8.9 mg/dL (ref 8.4–10.5)
Chloride: 102 meq/L (ref 96–112)
Creatinine, Ser: 1.11 mg/dL (ref 0.40–1.20)
GFR: 46.02 mL/min — ABNORMAL LOW (ref >60.00)
Glucose, Bld: 152 mg/dL — ABNORMAL HIGH (ref 70–99)
Potassium: 4.1 meq/L (ref 3.5–5.1)
Sodium: 136 meq/L (ref 135–145)

## 2020-02-07 LAB — HEMOGLOBIN A1C: Hgb A1c MFr Bld: 7.4 % — ABNORMAL HIGH (ref 4.6–6.5)

## 2020-02-07 NOTE — Assessment & Plan Note (Signed)
On Coumadin, Toprol XL Labs

## 2020-02-07 NOTE — Patient Instructions (Signed)
[place] vaccine booster sign up: please call [place] [facility]ine Line at [phone]. You can also call the venue where you had your initial COVID 19 vaccination.

## 2020-02-07 NOTE — Assessment & Plan Note (Signed)
Metoprolol, Norvasc, Lasix

## 2020-02-07 NOTE — Progress Notes (Signed)
Subjective:  Patient ID: [name], female    DOB: [birth year]  Age: 85 y.o. MRN: [id number]  CC: Follow-up (3 MONTH FOLLOW-UP)   HPI [name] presents for L ankle pain - better, anxiety, HTN f/u  Outpatient Medications Prior to Visit  Medication Sig Dispense Refill  . ACCU-CHEK FASTCLIX LANCETS MISC Use to check blood sugars twice a day Dx E11.9 102 each 2  . Alcohol Swabs (B-D SINGLE USE SWABS REGULAR) PADS Use to clean injection site to check blood sugars daily 300 each 3  . ALPRAZolam (XANAX) 0.5 MG tablet TAKE 1/2 TO 1 TABLET BY MOUTH THREE TIMES DAILY AS NEEDED FOR ANXIETY. DO NOT TAKE WITH TRAMADOL.THIS IS A 30 DAY SUPPLY 90 tablet 3  . amLODipine (NORVASC) 5 MG tablet Take 1 tablet (5 mg total) by mouth daily. 90 tablet 3  . aspirin 81 MG tablet Take 81 mg by mouth daily.      . Cholecalciferol (VITAMIN D3) 50 MCG (2000 UT) capsule Take 1 capsule (2,000 Units total) by mouth daily. 100 capsule 3  . clidinium-chlordiazePOXIDE (LIBRAX) 5-2.5 MG capsule TAKE ONE CAPSULE BY MOUTH THREE TIMES DAILY AS NEEDED FOR CRAMPS 20 capsule 2  . diazepam (VALIUM) 5 MG tablet Take by mouth.    . diclofenac (VOLTAREN) 75 MG EC tablet     . diclofenac Sodium (VOLTAREN) 1 % GEL Apply 2 g topically 4 (four) times daily. 150 g 1  . furosemide (LASIX) 20 MG tablet TAKE 1 TABLET(20 MG) BY MOUTH DAILY AS NEEDED FOR SWELLING 30 tablet 3  . glucose blood (ACCU-CHEK AVIVA PLUS) test strip Use to check blood sugars twice a day 100 each 2  . hydroxypropyl methylcellulose (ISOPTO TEARS) 2.5 % ophthalmic solution Place 1 drop into both eyes 2 (two) times daily.      . hydrOXYzine (ATARAX/VISTARIL) 25 MG tablet Take 1 tablet (25 mg total) by mouth every 8 (eight) hours as needed for itching. 60 tablet 0  . isosorbide mononitrate (IMDUR) 30 MG 24 hr tablet TAKE 1 TABLET BY MOUTH EVERY DAY 30 tablet 11  . Lancets (ACCU-CHEK SOFT TOUCH) lancets Use as instructed 300 each 3  . metoprolol succinate  (TOPROL-XL) 50 MG 24 hr tablet TAKE 1 TABLET(50 MG) BY MOUTH TWICE DAILY 180 tablet 1  . Nabumetone (RELAFEN DS) 1000 MG TABS Take 1 tablet by mouth daily as needed. 6 tablet 0  . pantoprazole (PROTONIX) 40 MG tablet TAKE 1 TABLET BY MOUTH EVERY DAY 90 tablet 3  . simvastatin (ZOCOR) 20 MG tablet TAKE 1 TABLET BY MOUTH AT BEDTIME 90 tablet 3  . traMADol (ULTRAM) 50 MG tablet Take 1 tablet (50 mg total) by mouth 3 (three) times daily. 90 tablet 3  . triamcinolone cream (KENALOG) 0.1 % Apply 1 application topically 2 (two) times daily. 80 g 0  . warfarin (COUMADIN) 5 MG tablet TAKE 1 TABLET BY MOUTH EVERY DAY AS DIRECTED BY COUMADIN CLINIC 90 tablet 0  . ALPRAZolam (XANAX) 0.5 MG tablet TAKE 1/2 TO 1 TABLET BY MOUTH THREE TIMES DAILY AS NEEDED FOR ANXIETY. DO NOT TAKE WITH TRAMADOL.THIS IS A 30 DAY SUPPLY 90 tablet 1  . Blood Glucose Calibration (ACCU-CHEK AVIVA) SOLN Use to calibrate BS monitor 3 each 3  . Blood Glucose Monitoring Suppl (ACCU-CHEK AVIVA PLUS) w/Device KIT Use to check blood sugars twice a day Dx E11.9 1 kit 0  . Blood Glucose Monitoring Suppl (ACCU-CHEK AVIVA PLUS) w/Device KIT Use to check blood  sugars daily 1 kit 0  . glucose blood (ACCU-CHEK AVIVA PLUS) test strip Use to check blood sugars daily 300 each 3   No facility-administered medications prior to visit.    ROS: Review of Systems  Constitutional: Negative for activity change, appetite change, chills, fatigue and unexpected weight change.  HENT: Negative for congestion, mouth sores and sinus pressure.   Eyes: Negative for visual disturbance.  Respiratory: Negative for cough and chest tightness.   Gastrointestinal: Negative for abdominal pain and nausea.  Genitourinary: Negative for difficulty urinating, frequency and vaginal pain.  Musculoskeletal: Negative for back pain and gait problem.  Skin: Negative for pallor and rash.  Neurological: Negative for dizziness, tremors, weakness, numbness and headaches.    Psychiatric/Behavioral: Negative for confusion and sleep disturbance.    Objective:  BP 128/70 (BP Location: Left Arm)   Pulse 72   Temp 98.5 F (36.9 C) (Oral)   Wt 189 lb 10.1 oz (86 kg)   SpO2 96%   BMI 30.61 kg/m   BP Readings from Last 3 Encounters:  02/07/20 128/70  12/27/19 134/80  11/01/19 116/60    Wt Readings from Last 3 Encounters:  02/07/20 189 lb 10.1 oz (86 kg)  12/27/19 185 lb (83.9 kg)  11/15/19 186 lb (84.4 kg)    Physical Exam Constitutional:      General: She is not in acute distress.    Appearance: She is well-developed. She is obese.  HENT:     Head: Normocephalic.     Right Ear: External ear normal.     Left Ear: External ear normal.     Nose: Nose normal.  Eyes:     General:        Right eye: No discharge.        Left eye: No discharge.     Conjunctiva/sclera: Conjunctivae normal.     Pupils: Pupils are equal, round, and reactive to light.  Neck:     Thyroid: No thyromegaly.     Vascular: No JVD.     Trachea: No tracheal deviation.  Cardiovascular:     Rate and Rhythm: Normal rate and regular rhythm.     Heart sounds: Normal heart sounds.  Pulmonary:     Effort: No respiratory distress.     Breath sounds: No stridor. No wheezing.  Abdominal:     General: Bowel sounds are normal. There is no distension.     Palpations: Abdomen is soft. There is no mass.     Tenderness: There is no abdominal tenderness. There is no guarding or rebound.  Musculoskeletal:        General: No tenderness.     Cervical back: Normal range of motion and neck supple.  Lymphadenopathy:     Cervical: No cervical adenopathy.  Skin:    Findings: No erythema or rash.  Neurological:     Mental Status: She is oriented to person, place, and time.     Cranial Nerves: No cranial nerve deficit.     Motor: No abnormal muscle tone.     Coordination: Coordination normal.     Deep Tendon Reflexes: Reflexes normal.  Psychiatric:        Behavior: Behavior normal.         Thought Content: Thought content normal.        Judgment: Judgment normal.     Lab Results  Component Value Date   WBC 6.0 10/30/2019   HGB 13.0 10/30/2019   HCT 39.9 10/30/2019   PLT 222 10/30/2019  GLUCOSE 134 (H) 10/30/2019   CHOL 176 11/18/2018   TRIG 75.0 11/18/2018   HDL 72.20 11/18/2018   LDLDIRECT 105.5 11/26/2010   LDLCALC 89 11/18/2018   ALT 12 05/02/2019   AST 19 05/02/2019   NA 140 10/30/2019   K 4.2 10/30/2019   CL 101 10/30/2019   CREATININE 1.09 (H) 10/30/2019   BUN 17 10/30/2019   CO2 27 10/30/2019   TSH 1.00 05/02/2019   INR 2.7 12/27/2019   HGBA1C 7.1 (H) 08/02/2019    DG Chest Port 1 View  Result Date: 10/30/2019 CLINICAL DATA:  Foot swelling. EXAM: PORTABLE CHEST 1 VIEW COMPARISON:  January 07, 2018 FINDINGS: There is no evidence of acute infiltrate, pleural effusion or pneumothorax. The heart size and mediastinal contours are within normal limits. The visualized skeletal structures are unremarkable. IMPRESSION: No active disease. Electronically Signed   By: Virgina Norfolk M.D.   On: 10/30/2019 02:47    Assessment & Plan:    Walker Kehr, MD

## 2020-02-07 NOTE — Assessment & Plan Note (Signed)
On diet Labs 

## 2020-02-07 NOTE — Addendum Note (Signed)
Addended by: Cresenciano Lick on: 02/07/2020 10:16 AM   Modules accepted: Orders

## 2020-02-08 ENCOUNTER — Ambulatory Visit (INDEPENDENT_AMBULATORY_CARE_PROVIDER_SITE_OTHER): Payer: Medicare HMO | Admitting: Pharmacist Clinician (PhC)/ Clinical Pharmacy Specialist

## 2020-02-08 DIAGNOSIS — Z7901 Long term (current) use of anticoagulants: Secondary | ICD-10-CM | POA: Diagnosis not present

## 2020-02-08 DIAGNOSIS — I480 Paroxysmal atrial fibrillation: Secondary | ICD-10-CM

## 2020-02-08 LAB — POCT INR: INR: 2.7 (ref 2.0–3.0)

## 2020-02-13 ENCOUNTER — Telehealth: Payer: Self-pay | Admitting: Internal Medicine

## 2020-02-13 NOTE — Telephone Encounter (Signed)
Patient is having frequency urinating and was wondering if she could get a uranalysis so she can get some medication. Patient # [phone]

## 2020-02-14 MED ORDER — CEPHALEXIN 500 MG PO CAPS
500.0000 mg | ORAL_CAPSULE | Freq: Three times a day (TID) | ORAL | 0 refills | Status: DC
Start: 2020-02-14 — End: 2020-02-28

## 2020-02-14 NOTE — Telephone Encounter (Signed)
[place] emailed Office visit if not better.  Thanks

## 2020-02-14 NOTE — Telephone Encounter (Signed)
Notified pt w/MD response.../lmb 

## 2020-02-16 ENCOUNTER — Other Ambulatory Visit: Payer: Self-pay | Admitting: Internal Medicine

## 2020-02-21 ENCOUNTER — Other Ambulatory Visit: Payer: Self-pay | Admitting: Internal Medicine

## 2020-02-24 NOTE — Telephone Encounter (Signed)
   Patient calling to report she still has frequent urination Appointment scheduled for 11/16

## 2020-02-28 ENCOUNTER — Encounter: Payer: Self-pay | Admitting: Internal Medicine

## 2020-02-28 ENCOUNTER — Ambulatory Visit (INDEPENDENT_AMBULATORY_CARE_PROVIDER_SITE_OTHER): Payer: Medicare HMO | Admitting: Internal Medicine

## 2020-02-28 ENCOUNTER — Other Ambulatory Visit: Payer: Self-pay

## 2020-02-28 VITALS — BP 134/70 | HR 61 | Temp 97.6°F | Wt 187.4 lb

## 2020-02-28 DIAGNOSIS — R350 Frequency of micturition: Secondary | ICD-10-CM | POA: Diagnosis not present

## 2020-02-28 DIAGNOSIS — E119 Type 2 diabetes mellitus without complications: Secondary | ICD-10-CM | POA: Diagnosis not present

## 2020-02-28 LAB — POCT URINALYSIS DIPSTICK
Bilirubin, UA: NEGATIVE
Blood, UA: POSITIVE
Glucose, UA: POSITIVE — AB
Ketones, UA: NEGATIVE
Leukocytes, UA: NEGATIVE
Nitrite, UA: NEGATIVE
Protein, UA: NEGATIVE
Spec Grav, UA: 1.01 (ref 1.010–1.025)
Urobilinogen, UA: 0.2 E.U./dL
pH, UA: 6.5 (ref 5.0–8.0)

## 2020-02-28 MED ORDER — REPAGLINIDE 0.5 MG PO TABS: 0.5000 mg | ORAL_TABLET | Freq: Three times a day (TID) | ORAL | 11 refills | Status: DC

## 2020-02-28 NOTE — Progress Notes (Signed)
Subjective:  Patient ID: [name], female    DOB: Aug 19, 1934  Age: 85 y.o. MRN: [id number]  CC: Urinary Frequency   HPI [name] presents for urinary frequency. No pain. Pt took Keflex -sx's got better, later sx's re-started... Taking Furosemide QOD for swelling  Outpatient Medications Prior to Visit  Medication Sig Dispense Refill  . ACCU-CHEK FASTCLIX LANCETS MISC Use to check blood sugars twice a day Dx E11.9 102 each 2  . Alcohol Swabs (B-D SINGLE USE SWABS REGULAR) PADS Use to clean injection site to check blood sugars daily 300 each 3  . ALPRAZolam (XANAX) 0.5 MG tablet TAKE 1/2 TO 1 TABLET BY MOUTH THREE TIMES DAILY AS NEEDED FOR ANXIETY. DO NOT TAKE WITH TRAMADOL.THIS IS A 30 DAY SUPPLY 90 tablet 3  . amLODipine (NORVASC) 5 MG tablet Take 1 tablet (5 mg total) by mouth daily. 90 tablet 3  . aspirin 81 MG tablet Take 81 mg by mouth daily.      . Cholecalciferol (VITAMIN D3) 50 MCG (2000 UT) capsule Take 1 capsule (2,000 Units total) by mouth daily. 100 capsule 3  . clidinium-chlordiazePOXIDE (LIBRAX) 5-2.5 MG capsule TAKE ONE CAPSULE BY MOUTH THREE TIMES DAILY AS NEEDED FOR CRAMPS 20 capsule 2  . diazepam (VALIUM) 5 MG tablet Take by mouth.    . diclofenac (VOLTAREN) 75 MG EC tablet     . diclofenac Sodium (VOLTAREN) 1 % GEL Apply 2 g topically 4 (four) times daily. 150 g 1  . furosemide (LASIX) 20 MG tablet TAKE 1 TABLET(20 MG) BY MOUTH DAILY AS NEEDED FOR SWELLING 30 tablet 3  . glucose blood (ACCU-CHEK AVIVA PLUS) test strip Use to check blood sugars twice a day 100 each 2  . hydroxypropyl methylcellulose (ISOPTO TEARS) 2.5 % ophthalmic solution Place 1 drop into both eyes 2 (two) times daily.      . hydrOXYzine (ATARAX/VISTARIL) 25 MG tablet Take 1 tablet (25 mg total) by mouth every 8 (eight) hours as needed for itching. 60 tablet 0  . isosorbide mononitrate (IMDUR) 30 MG 24 hr tablet TAKE 1 TABLET BY MOUTH EVERY DAY 30 tablet 11  . Lancets (ACCU-CHEK SOFT  TOUCH) lancets Use as instructed 300 each 3  . metoprolol succinate (TOPROL-XL) 50 MG 24 hr tablet TAKE 1 TABLET(50 MG) BY MOUTH TWICE DAILY 180 tablet 1  . Nabumetone (RELAFEN DS) 1000 MG TABS Take 1 tablet by mouth daily as needed. 6 tablet 0  . pantoprazole (PROTONIX) 40 MG tablet TAKE 1 TABLET BY MOUTH EVERY DAY 90 tablet 3  . simvastatin (ZOCOR) 20 MG tablet TAKE 1 TABLET BY MOUTH AT BEDTIME 90 tablet 1  . traMADol (ULTRAM) 50 MG tablet Take 1 tablet (50 mg total) by mouth 3 (three) times daily. 90 tablet 3  . warfarin (COUMADIN) 5 MG tablet TAKE 1 TABLET BY MOUTH EVERY DAY AS DIRECTED BY COUMADIN CLINIC 90 tablet 0  . triamcinolone cream (KENALOG) 0.1 % Apply 1 application topically 2 (two) times daily. 80 g 0  . cephALEXin (KEFLEX) 500 MG capsule Take 1 capsule (500 mg total) by mouth 3 (three) times daily. (Patient not taking: Reported on 02/28/2020) 12 capsule 0   No facility-administered medications prior to visit.    ROS: Review of Systems  Constitutional: Negative for activity change, appetite change, chills, fatigue, fever and unexpected weight change.  HENT: Negative for congestion, mouth sores and sinus pressure.   Eyes: Negative for visual disturbance.  Respiratory: Negative for cough and  chest tightness.   Gastrointestinal: Negative for abdominal pain and nausea.  Genitourinary: Positive for frequency. Negative for difficulty urinating and vaginal pain.  Musculoskeletal: Negative for back pain and gait problem.  Skin: Negative for pallor and rash.  Neurological: Negative for dizziness, tremors, weakness, numbness and headaches.  Psychiatric/Behavioral: Negative for confusion and sleep disturbance.    Objective:  BP 134/70 (BP Location: Left Arm)   Pulse 61   Temp 97.6 F (36.4 C) (Oral)   Wt 187 lb 6.4 oz (85 kg)   SpO2 97%   BMI 30.25 kg/m   BP Readings from Last 3 Encounters:  02/28/20 134/70  02/07/20 128/70  12/27/19 134/80    Wt Readings from Last 3  Encounters:  02/28/20 187 lb 6.4 oz (85 kg)  02/07/20 189 lb 10.1 oz (86 kg)  12/27/19 185 lb (83.9 kg)    Physical Exam Constitutional:      General: She is not in acute distress.    Appearance: She is well-developed.  HENT:     Head: Normocephalic.     Right Ear: External ear normal.     Left Ear: External ear normal.     Nose: Nose normal.  Eyes:     General:        Right eye: No discharge.        Left eye: No discharge.     Conjunctiva/sclera: Conjunctivae normal.     Pupils: Pupils are equal, round, and reactive to light.  Neck:     Thyroid: No thyromegaly.     Vascular: No JVD.     Trachea: No tracheal deviation.  Cardiovascular:     Rate and Rhythm: Normal rate and regular rhythm.     Heart sounds: Normal heart sounds.  Pulmonary:     Effort: No respiratory distress.     Breath sounds: No stridor. No wheezing.  Abdominal:     General: Bowel sounds are normal. There is no distension.     Palpations: Abdomen is soft. There is no mass.     Tenderness: There is no abdominal tenderness. There is no guarding or rebound.  Musculoskeletal:        General: No tenderness.     Cervical back: Normal range of motion and neck supple.  Lymphadenopathy:     Cervical: No cervical adenopathy.  Skin:    Findings: No erythema or rash.  Neurological:     Cranial Nerves: No cranial nerve deficit.     Motor: No abnormal muscle tone.     Coordination: Coordination normal.     Deep Tendon Reflexes: Reflexes normal.  Psychiatric:        Behavior: Behavior normal.        Thought Content: Thought content normal.        Judgment: Judgment normal.     Lab Results  Component Value Date   WBC 6.0 10/30/2019   HGB 13.0 10/30/2019   HCT 39.9 10/30/2019   PLT 222 10/30/2019   GLUCOSE 152 (H) 02/07/2020   CHOL 176 11/18/2018   TRIG 75.0 11/18/2018   HDL 72.20 11/18/2018   LDLDIRECT 105.5 11/26/2010   LDLCALC 89 11/18/2018   ALT 12 05/02/2019   AST 19 05/02/2019   NA 136  02/07/2020   K 4.1 02/07/2020   CL 102 02/07/2020   CREATININE 1.11 02/07/2020   BUN 13 02/07/2020   CO2 28 02/07/2020   TSH 1.00 05/02/2019   INR 2.7 02/08/2020   HGBA1C 7.4 (H) 02/07/2020  DG Chest Port 1 View  Result Date: 10/30/2019 CLINICAL DATA:  Foot swelling. EXAM: PORTABLE CHEST 1 VIEW COMPARISON:  January 07, 2018 FINDINGS: There is no evidence of acute infiltrate, pleural effusion or pneumothorax. The heart size and mediastinal contours are within normal limits. The visualized skeletal structures are unremarkable. IMPRESSION: No active disease. Electronically Signed   By: Virgina Norfolk M.D.   On: 10/30/2019 02:47    Assessment & Plan:    Walker Kehr, MD

## 2020-02-28 NOTE — Assessment & Plan Note (Addendum)
Worse Start Prandin

## 2020-02-28 NOTE — Assessment & Plan Note (Addendum)
Taking Furosemide QOD for swelling - try once a week UA Treat DM

## 2020-03-02 ENCOUNTER — Telehealth: Payer: Self-pay | Admitting: Internal Medicine

## 2020-03-02 NOTE — Telephone Encounter (Signed)
Covid shots- 03.01.2021 03.29.2021 [facility]   Patient states there was some medication Dr. Alain Marion he was supposed to be putting her on that they talked about during their last visit and she is no longer having the bathroom issues and the medication was very expensive so she was wondering if she could not take that. Patient unsure what the medication was.

## 2020-03-02 NOTE — Telephone Encounter (Signed)
Called pt inform her the medication that MD rx was actually for her Diabetes. " Prandin". Pt states [facility] didn't have the medication in stock, but it is too expensive. Pt states she see MD on 12/14, and will wait until her appt to discuss diabetes.Marland KitchenJohny Chess

## 2020-03-04 ENCOUNTER — Other Ambulatory Visit: Payer: Self-pay

## 2020-03-04 ENCOUNTER — Emergency Department (HOSPITAL_COMMUNITY): Payer: Medicare HMO

## 2020-03-04 ENCOUNTER — Emergency Department (HOSPITAL_COMMUNITY)
Admission: EM | Admit: 2020-03-04 | Discharge: 2020-03-04 | Disposition: A | Discharge status: Home / Self Care | Payer: Medicare HMO | Attending: Emergency Medicine | Admitting: Emergency Medicine

## 2020-03-04 DIAGNOSIS — I10 Essential (primary) hypertension: Secondary | ICD-10-CM | POA: Insufficient documentation

## 2020-03-04 DIAGNOSIS — I4891 Unspecified atrial fibrillation: Secondary | ICD-10-CM | POA: Diagnosis not present

## 2020-03-04 DIAGNOSIS — I491 Atrial premature depolarization: Secondary | ICD-10-CM | POA: Insufficient documentation

## 2020-03-04 DIAGNOSIS — Z7901 Long term (current) use of anticoagulants: Secondary | ICD-10-CM | POA: Diagnosis not present

## 2020-03-04 DIAGNOSIS — Z79899 Other long term (current) drug therapy: Secondary | ICD-10-CM | POA: Diagnosis not present

## 2020-03-04 DIAGNOSIS — R002 Palpitations: Secondary | ICD-10-CM | POA: Diagnosis not present

## 2020-03-04 LAB — CBC
HCT: 41.5 % (ref 36.0–46.0)
Hemoglobin: 13.5 g/dL (ref 12.0–15.0)
MCH: 29.3 pg (ref 26.0–34.0)
MCHC: 32.5 g/dL (ref 30.0–36.0)
MCV: 90.2 fL (ref 80.0–100.0)
Platelets: 225 10*3/uL (ref 150–400)
RBC: 4.6 MIL/uL (ref 3.87–5.11)
RDW: 12.7 % (ref 11.5–15.5)
WBC: 5.6 10*3/uL (ref 4.0–10.5)
nRBC: 0 % (ref 0.0–0.2)

## 2020-03-04 LAB — PROTIME-INR
INR: 2 — ABNORMAL HIGH (ref 0.8–1.2)
Prothrombin Time: 22.4 seconds — ABNORMAL HIGH (ref 11.4–15.2)

## 2020-03-04 LAB — BASIC METABOLIC PANEL
Anion gap: 12 (ref 5–15)
BUN: 13 mg/dL (ref 8–23)
CO2: 27 mmol/L (ref 22–32)
Calcium: 9.2 mg/dL (ref 8.9–10.3)
Chloride: 99 mmol/L (ref 98–111)
Creatinine, Ser: 0.94 mg/dL (ref 0.44–1.00)
GFR, Estimated: >60 mL/min (ref >60)
Glucose, Bld: 173 mg/dL — ABNORMAL HIGH (ref 70–99)
Potassium: 3.6 mmol/L (ref 3.5–5.1)
Sodium: 138 mmol/L (ref 135–145)

## 2020-03-04 LAB — MAGNESIUM: Magnesium: 1.9 mg/dL (ref 1.7–2.4)

## 2020-03-04 LAB — TSH: TSH: 2.445 u[IU]/mL (ref 0.350–4.500)

## 2020-03-04 LAB — TROPONIN I (HIGH SENSITIVITY): Troponin I (High Sensitivity): 4 ng/L (ref <18)

## 2020-03-04 NOTE — ED Provider Notes (Addendum)
TIME SEEN: 3:05 AM  CHIEF COMPLAINT: Palpitations  HPI: Patient is an 85 year old female with history of hypertension, nonobstructive CAD, paroxysmal A. fib on Coumadin, history of SVT who presents to the emergency department with palpitations since 11 PM tonight.  No chest pain, shortness of breath, fevers, cough, nausea, vomiting, diarrhea, bloody stools or melena.  She is on Toprol and states that she has been compliant with her medications.  Took an extra dose of Toprol 25 mg just prior to arrival reports her palpitations have improved but not resolved.  She has a cardiologist for follow-up.  States she has had palpitations in the past.  ROS: See HPI Constitutional: no fever  Eyes: no drainage  ENT: no runny nose   Cardiovascular:  no chest pain  Resp: no SOB  GI: no vomiting GU: no dysuria Integumentary: no rash  Allergy: no hives  Musculoskeletal: no leg swelling  Neurological: no slurred speech ROS otherwise negative  PAST MEDICAL HISTORY/PAST SURGICAL HISTORY:  Past Medical History:  Diagnosis Date  . Anxiety state, unspecified   . Bronchitis, not specified as acute or chronic   . Diaphragmatic hernia without mention of obstruction or gangrene   . Dyslipidemia   . Essential hypertension   . Irritable bowel syndrome   . Mitral valve disorders(424.0)    a. 08/2010 Echo: EF >55%, mild MR, mod TR, trace AI.  [name] Non-obstructive CAD    a. 07/2010 Lexiscan MV: no ischemia, EF 78%; b. 07/2010 Cath: LM nl, LAD50/60p, 40 apical, LCX nl, RCA dominant, 30/40p, 77m.  . Osteoarthrosis, unspecified whether generalized or localized, unspecified site   . Osteoporosis, unspecified   . Other abnormal glucose   . PAF (paroxysmal atrial fibrillation) (HCC)    a. CHA2DS2VASc = 4-->coumadin.  [name] Phlebitis and thrombophlebitis of superficial vessels of lower extremities   . PSVT (paroxysmal supraventricular tachycardia) ([place])    a. 05/2012 Holter: short bursts of PSVT noted-->Managed with toprol.   [name] Unspecified venous (peripheral) insufficiency   . Unspecified vitamin D deficiency     MEDICATIONS:  Prior to Admission medications   Medication Sig Start Date End Date Taking? Authorizing Provider  ACCU-CHEK FASTCLIX LANCETS MISC Use to check blood sugars twice a day Dx E11.9 07/18/15   Plotnikov, Evie Lacks, MD  Alcohol Swabs (B-D SINGLE USE SWABS REGULAR) PADS Use to clean injection site to check blood sugars daily 05/16/19   Plotnikov, Evie Lacks, MD  ALPRAZolam (XANAX) 0.5 MG tablet TAKE 1/2 TO 1 TABLET BY MOUTH THREE TIMES DAILY AS NEEDED FOR ANXIETY. DO NOT TAKE WITH TRAMADOL.THIS IS A 30 DAY SUPPLY 12/20/19   Plotnikov, Evie Lacks, MD  amLODipine (NORVASC) 5 MG tablet Take 1 tablet (5 mg total) by mouth daily. 06/28/19   Plotnikov, Evie Lacks, MD  aspirin 81 MG tablet Take 81 mg by mouth daily.      [provider]  Cholecalciferol (VITAMIN D3) 50 MCG (2000 UT) capsule Take 1 capsule (2,000 Units total) by mouth daily. 08/02/19   Plotnikov, Evie Lacks, MD  clidinium-chlordiazePOXIDE (LIBRAX) 5-2.5 MG capsule TAKE ONE CAPSULE BY MOUTH THREE TIMES DAILY AS NEEDED FOR CRAMPS 05/02/19   Plotnikov, Evie Lacks, MD  diazepam (VALIUM) 5 MG tablet Take by mouth. 08/31/19   [provider]  diclofenac Sodium (VOLTAREN) 1 % GEL Apply 2 g topically 4 (four) times daily. 11/01/19   Aundra Dubin, PA-C  furosemide (LASIX) 20 MG tablet TAKE 1 TABLET(20 MG) BY MOUTH DAILY AS NEEDED FOR SWELLING 02/22/20  Pixie Casino, MD  glucose blood (ACCU-CHEK AVIVA PLUS) test strip Use to check blood sugars twice a day 07/15/18   Plotnikov, Evie Lacks, MD  hydroxypropyl methylcellulose (ISOPTO TEARS) 2.5 % ophthalmic solution Place 1 drop into both eyes 2 (two) times daily.      [provider]  hydrOXYzine (ATARAX/VISTARIL) 25 MG tablet Take 1 tablet (25 mg total) by mouth every 8 (eight) hours as needed for itching. 01/19/19   Plotnikov, Evie Lacks, MD  isosorbide mononitrate (IMDUR) 30 MG 24 hr  tablet TAKE 1 TABLET BY MOUTH EVERY DAY 07/05/19   Hilty, Nadean Corwin, MD  Lancets (ACCU-CHEK SOFT TOUCH) lancets Use as instructed 05/16/19   Plotnikov, Evie Lacks, MD  metoprolol succinate (TOPROL-XL) 50 MG 24 hr tablet TAKE 1 TABLET(50 MG) BY MOUTH TWICE DAILY 10/17/19   Hilty, Nadean Corwin, MD  pantoprazole (PROTONIX) 40 MG tablet TAKE 1 TABLET BY MOUTH EVERY DAY 06/06/19   Hilty, Nadean Corwin, MD  repaglinide (PRANDIN) 0.5 MG tablet Take 1 tablet (0.5 mg total) by mouth 3 (three) times daily before meals. 02/28/20   Plotnikov, Evie Lacks, MD  simvastatin (ZOCOR) 20 MG tablet TAKE 1 TABLET BY MOUTH AT BEDTIME 02/16/20   Plotnikov, Evie Lacks, MD  traMADol (ULTRAM) 50 MG tablet Take 1 tablet (50 mg total) by mouth 3 (three) times daily. 09/21/18   Plotnikov, Evie Lacks, MD  warfarin (COUMADIN) 5 MG tablet TAKE 1 TABLET BY MOUTH EVERY DAY AS DIRECTED BY COUMADIN CLINIC 12/26/19   Hilty, Nadean Corwin, MD    ALLERGIES:  Allergies  Allergen Reactions  . Clarithromycin     REACTION: unspecified  . Fosamax [Alendronate Sodium]     achy  . Guaifenesin Er     itching  . Metformin     REACTION: pt states INTOL to Metformin "felt drained"  . Tape     Adhesive tape---rash    SOCIAL HISTORY:  Social History   Tobacco Use  . Smoking status: Never Smoker  . Smokeless tobacco: Never Used  Substance Use Topics  . Alcohol use: No    FAMILY HISTORY: Family History  Problem Relation Age of Onset  . Stroke Mother   . Stroke Father        also heart disease  . Clotting disorder Paternal Grandmother        blood clot  . Stroke Brother        also heart disease    EXAM: BP (!) 171/89 (BP Location: Right Arm)   Pulse 78   Temp (!) 97.5 F (36.4 C) (Oral)   Resp 12   Ht 5\' 6"  (1.676 m)   Wt 84.8 kg   SpO2 100%   BMI 30.18 kg/m  CONSTITUTIONAL: Alert and oriented and responds appropriately to questions. Well-appearing; well-nourished, elderly, in no distress HEAD: Normocephalic EYES: Conjunctivae clear,  pupils appear equal, EOM appear intact ENT: normal nose; moist mucous membranes NECK: Supple, normal ROM CARD: RRR; S1 and S2 appreciated; no murmurs, no clicks, no rubs, no gallops RESP: Normal chest excursion without splinting or tachypnea; breath sounds clear and equal bilaterally; no wheezes, no rhonchi, no rales, no hypoxia or respiratory distress, speaking full sentences ABD/GI: Normal bowel sounds; non-distended; soft, non-tender, no rebound, no guarding, no peritoneal signs, no hepatosplenomegaly BACK:  The back appears normal EXT: Normal ROM in all joints; no deformity noted, no edema; no cyanosis, no calf tenderness or calf swelling SKIN: Normal color for age and race; warm; no rash on  exposed skin NEURO: Moves all extremities equally PSYCH: The patient's mood and manner are appropriate.   MEDICAL DECISION MAKING: Patient here with palpitations.  Patient having PACs on the monitor.  EKG shows no new ischemic abnormality compared to previous EKG from 2011.  She denies any chest pain or chest discomfort.  Will check electrolytes to ensure no metabolic derangement precipitating her PACs today.  Will check hemoglobin as well.  Troponin, INR pending.  Chest x-ray clear and shows no acute abnormality.  ED PROGRESS: Patient's labs reassuring.  Normal electrolytes, hemoglobin.  She has a normal high-sensitivity troponin.  I do not feel this needs to be repeated.  TSH normal.  INR therapeutic.  Patient comfortable with this plan.  Have advised her to follow-up with her cardiologist.   At this time, I do not feel there is any life-threatening condition present. I have reviewed, interpreted and discussed all results (EKG, imaging, lab, urine as appropriate) and exam findings with patient/family. I have reviewed nursing notes and appropriate previous records.  I feel the patient is safe to be discharged home without further emergent workup and can continue workup as an outpatient as needed. Discussed  usual and customary return precautions. Patient/family verbalize understanding and are comfortable with this plan.  Outpatient follow-up has been provided as needed. All questions have been answered.     EKG Interpretation  Date/Time:  Sunday March 04 2020 02:53:31 EST Ventricular Rate:  65 PR Interval:    QRS Duration: 95 QT Interval:  412 QTC Calculation: 429 R Axis:   -24 Text Interpretation: Sinus rhythm Abnormal R-wave progression, early transition LVH with secondary repolarization abnormality 12 Lead; Mason-Likar Confirmed by Pryor Curia [phone]) on 03/04/2020 3:03:17 AM        EKG Interpretation  Date/Time:  Sunday March 04 2020 03:09:06 EST Ventricular Rate:  74 PR Interval:    QRS Duration: 97 QT Interval:  407 QTC Calculation: 452 R Axis:   -19 Text Interpretation: Sinus rhythm Atrial premature complex Prolonged PR interval Left ventricular hypertrophy Nonspecific T abnormalities, inferior leads also present on EKG in March 2011 Confirmed by Pryor Curia [phone]) on 03/04/2020 3:18:21 AM          [name] was evaluated in Emergency Department on 03/04/2020 for the symptoms described in the history of present illness. She was evaluated in the context of the global COVID-19 pandemic, which necessitated consideration that the patient might be at risk for infection with the SARS-CoV-2 virus that causes COVID-19. Institutional protocols and algorithms that pertain to the evaluation of patients at risk for COVID-19 are in a state of rapid change based on information released by regulatory bodies including the CDC and federal and state organizations. These policies and algorithms were followed during the patient's care in the ED.      Minda Faas, Delice Bison, DO 03/04/20 0413    Jaedin Trumbo, Delice Bison, DO 03/04/20 [id number]

## 2020-03-04 NOTE — Telephone Encounter (Signed)
Prandin is generic and should not be expensive. If it is truly expensive, I can send a prescription for glimepiride, however, she will need to get a glucometer and strips to monitor her sugar as well.  Let me know.

## 2020-03-04 NOTE — ED Notes (Signed)
Patient is complaining of fluttering heart. Patient states she wasn't doing anything that she has not done before. Patient is having no other symptoms.

## 2020-03-04 NOTE — ED Triage Notes (Addendum)
Pt reports "palpitations" starting around 11p tonight. States that it feels like fluttering. States that she was walking around the house when it started. She denies pain or shortness of breath. States that she has had the same thing in the past and takes medication for it.

## 2020-03-04 NOTE — ED Notes (Signed)
Pt ambulatory to the bathroom w/o assistance and with steady gait 

## 2020-03-12 NOTE — Telephone Encounter (Signed)
repaglinide (PRANDIN) 0.5 MG tablet Patient wondering if she needs to take this medication at the same time every day she thought she remembered Dr. Alain Marion saying something about if its past an hour of when she usually takes it don't take it that day. Patient wanted to talk to someone about the medication.

## 2020-03-14 NOTE — Telephone Encounter (Signed)
Called pt clarified msg.. Inform pt that  She need to take the Prandin three times a day w/meals. She states sometimes she eats at different times. Advise her to make sure she is checking her BS, and she states she is. Sometimes it up and sometimes its down. Inform pt to keep log of BS reading so MD can knowhow they are running as well, but make sure she takes her Prandin three times a day.Marland KitchenJohny Chess

## 2020-03-21 ENCOUNTER — Ambulatory Visit (INDEPENDENT_AMBULATORY_CARE_PROVIDER_SITE_OTHER): Payer: Medicare HMO

## 2020-03-21 ENCOUNTER — Other Ambulatory Visit: Payer: Self-pay

## 2020-03-21 DIAGNOSIS — Z7901 Long term (current) use of anticoagulants: Secondary | ICD-10-CM

## 2020-03-21 DIAGNOSIS — I480 Paroxysmal atrial fibrillation: Secondary | ICD-10-CM

## 2020-03-21 LAB — POCT INR: INR: 2.3 (ref 2.0–3.0)

## 2020-03-21 NOTE — Patient Instructions (Signed)
Continue with 1 tablet daily.  Repeat INR in 6 weeks.    

## 2020-03-23 ENCOUNTER — Telehealth: Payer: Self-pay | Admitting: Internal Medicine

## 2020-03-23 ENCOUNTER — Other Ambulatory Visit: Payer: Self-pay | Admitting: Internal Medicine

## 2020-03-23 DIAGNOSIS — I480 Paroxysmal atrial fibrillation: Secondary | ICD-10-CM

## 2020-03-23 NOTE — Telephone Encounter (Signed)
Called pt there was no answer LMOM RTC...lmb 

## 2020-03-23 NOTE — Telephone Encounter (Signed)
Okay to stop until visit next week with PCP.

## 2020-03-23 NOTE — Telephone Encounter (Signed)
Patient called and said that she has been taking repaglinide (PRANDIN) 0.5 MG tablet. She said that it has made her sugar drop down to the 60's and then she eats peanut butter and it brings it back up. She said she wanted to know if she could stop taking it until she sees PCP next week.

## 2020-03-26 NOTE — Telephone Encounter (Signed)
Patient called [facility] on 03/23/20   She wants Lorre Nick to call her back on Monday about her medicine.   Please call her back at [phone]

## 2020-03-28 ENCOUNTER — Ambulatory Visit (INDEPENDENT_AMBULATORY_CARE_PROVIDER_SITE_OTHER): Payer: Medicare HMO | Admitting: Internal Medicine

## 2020-03-28 ENCOUNTER — Other Ambulatory Visit: Payer: Self-pay

## 2020-03-28 ENCOUNTER — Encounter: Payer: Self-pay | Admitting: Internal Medicine

## 2020-03-28 DIAGNOSIS — I10 Essential (primary) hypertension: Secondary | ICD-10-CM | POA: Diagnosis not present

## 2020-03-28 DIAGNOSIS — I471 Supraventricular tachycardia: Secondary | ICD-10-CM | POA: Diagnosis not present

## 2020-03-28 DIAGNOSIS — R609 Edema, unspecified: Secondary | ICD-10-CM

## 2020-03-28 DIAGNOSIS — E119 Type 2 diabetes mellitus without complications: Secondary | ICD-10-CM | POA: Diagnosis not present

## 2020-03-28 DIAGNOSIS — E559 Vitamin D deficiency, unspecified: Secondary | ICD-10-CM | POA: Diagnosis not present

## 2020-03-28 DIAGNOSIS — R350 Frequency of micturition: Secondary | ICD-10-CM

## 2020-03-28 LAB — COMPREHENSIVE METABOLIC PANEL
ALT: 11 U/L (ref 0–35)
AST: 16 U/L (ref 0–37)
Albumin: 4.5 g/dL (ref 3.5–5.2)
Alkaline Phosphatase: 56 U/L (ref 39–117)
BUN: 15 mg/dL (ref 6–23)
CO2: 30 mEq/L (ref 19–32)
Calcium: 9.4 mg/dL (ref 8.4–10.5)
Chloride: 102 mEq/L (ref 96–112)
Creatinine, Ser: 1.1 mg/dL (ref 0.40–1.20)
GFR: 46.48 mL/min — ABNORMAL LOW (ref >60.00)
Glucose, Bld: 100 mg/dL — ABNORMAL HIGH (ref 70–99)
Potassium: 4 mEq/L (ref 3.5–5.1)
Sodium: 138 mEq/L (ref 135–145)
Total Bilirubin: 0.5 mg/dL (ref 0.2–1.2)
Total Protein: 7.5 g/dL (ref 6.0–8.3)

## 2020-03-28 LAB — HEMOGLOBIN A1C: Hgb A1c MFr Bld: 7.1 % — ABNORMAL HIGH (ref 4.6–6.5)

## 2020-03-28 NOTE — Assessment & Plan Note (Signed)
Metoprolol, Norvasc, Lasix

## 2020-03-28 NOTE — Assessment & Plan Note (Signed)
Better  

## 2020-03-28 NOTE — Assessment & Plan Note (Signed)
Overall better Prandin dropped CBGs to 66 once - she stopped it.

## 2020-03-28 NOTE — Progress Notes (Signed)
Subjective:  Patient ID: [name], female    DOB: 1934-10-20  Age: 85 y.o. MRN: [id number]  CC: Diabetes (4 week f/u- Pt states she had to stop taking the Prandin. Med was making her BS drop too low. She states it dropped in the 60's)   HPI [name] presents for palpitations - ER visit on 03/04/20 was reviewed No relapse  F/u HTN, DM, frequency - better  C/o Prandin dropped CBGs to 66 once - she stopped it.  Outpatient Medications Prior to Visit  Medication Sig Dispense Refill  . ACCU-CHEK FASTCLIX LANCETS MISC Use to check blood sugars twice a day Dx E11.9 102 each 2  . Alcohol Swabs (B-D SINGLE USE SWABS REGULAR) PADS Use to clean injection site to check blood sugars daily 300 each 3  . ALPRAZolam (XANAX) 0.5 MG tablet TAKE 1/2 TO 1 TABLET BY MOUTH THREE TIMES DAILY AS NEEDED FOR ANXIETY. DO NOT TAKE WITH TRAMADOL.THIS IS A 30 DAY SUPPLY 90 tablet 3  . amLODipine (NORVASC) 5 MG tablet Take 1 tablet (5 mg total) by mouth daily. 90 tablet 3  . aspirin 81 MG tablet Take 81 mg by mouth daily.    . Cholecalciferol (VITAMIN D3) 50 MCG (2000 UT) capsule Take 1 capsule (2,000 Units total) by mouth daily. 100 capsule 3  . clidinium-chlordiazePOXIDE (LIBRAX) 5-2.5 MG capsule TAKE ONE CAPSULE BY MOUTH THREE TIMES DAILY AS NEEDED FOR CRAMPS 20 capsule 2  . diazepam (VALIUM) 5 MG tablet Take by mouth.    . diclofenac Sodium (VOLTAREN) 1 % GEL Apply 2 g topically 4 (four) times daily. 150 g 1  . furosemide (LASIX) 20 MG tablet TAKE 1 TABLET(20 MG) BY MOUTH DAILY AS NEEDED FOR SWELLING 30 tablet 3  . glucose blood (ACCU-CHEK AVIVA PLUS) test strip Use to check blood sugars twice a day 100 each 2  . hydroxypropyl methylcellulose (ISOPTO TEARS) 2.5 % ophthalmic solution Place 1 drop into both eyes 2 (two) times daily.    . hydrOXYzine (ATARAX/VISTARIL) 25 MG tablet Take 1 tablet (25 mg total) by mouth every 8 (eight) hours as needed for itching. 60 tablet 0  . isosorbide mononitrate  (IMDUR) 30 MG 24 hr tablet TAKE 1 TABLET BY MOUTH EVERY DAY 30 tablet 11  . Lancets (ACCU-CHEK SOFT TOUCH) lancets Use as instructed 300 each 3  . metoprolol succinate (TOPROL-XL) 50 MG 24 hr tablet TAKE 1 TABLET(50 MG) BY MOUTH TWICE DAILY 180 tablet 1  . pantoprazole (PROTONIX) 40 MG tablet TAKE 1 TABLET BY MOUTH EVERY DAY 90 tablet 3  . simvastatin (ZOCOR) 20 MG tablet TAKE 1 TABLET BY MOUTH AT BEDTIME 90 tablet 1  . traMADol (ULTRAM) 50 MG tablet Take 1 tablet (50 mg total) by mouth 3 (three) times daily. 90 tablet 3  . warfarin (COUMADIN) 5 MG tablet TAKE 1 TABLET BY MOUTH EVERY DAY AS DIRECTED BY COUMADIN CLINIC 90 tablet 0  . repaglinide (PRANDIN) 0.5 MG tablet Take 1 tablet (0.5 mg total) by mouth 3 (three) times daily before meals. (Patient not taking: Reported on 03/28/2020) 90 tablet 11   No facility-administered medications prior to visit.    ROS: Review of Systems  Constitutional: Negative for activity change, appetite change, chills, fatigue and unexpected weight change.  HENT: Negative for congestion, mouth sores and sinus pressure.   Eyes: Negative for visual disturbance.  Respiratory: Negative for cough and chest tightness.   Cardiovascular: Positive for palpitations.  Gastrointestinal: Negative for abdominal pain  and nausea.  Genitourinary: Negative for difficulty urinating, frequency and vaginal pain.  Musculoskeletal: Negative for back pain and gait problem.  Skin: Negative for pallor and rash.  Neurological: Negative for dizziness, tremors, weakness, numbness and headaches.  Psychiatric/Behavioral: Negative for confusion and sleep disturbance.    Objective:  BP 130/72 (BP Location: Left Arm)   Pulse 63   Temp 98.1 F (36.7 C) (Oral)   Wt 189 lb 6.4 oz (85.9 kg)   SpO2 98%   BMI 30.57 kg/m   BP Readings from Last 3 Encounters:  03/28/20 130/72  03/04/20 (!) 152/85  02/28/20 134/70    Wt Readings from Last 3 Encounters:  03/28/20 189 lb 6.4 oz (85.9 kg)   03/04/20 187 lb (84.8 kg)  02/28/20 187 lb 6.4 oz (85 kg)    Physical Exam Constitutional:      General: She is not in acute distress.    Appearance: She is well-developed. She is obese.  HENT:     Head: Normocephalic.     Right Ear: External ear normal.     Left Ear: External ear normal.     Nose: Nose normal.     Mouth/Throat:     Mouth: Oropharynx is clear and moist.  Eyes:     General:        Right eye: No discharge.        Left eye: No discharge.     Conjunctiva/sclera: Conjunctivae normal.     Pupils: Pupils are equal, round, and reactive to light.  Neck:     Thyroid: No thyromegaly.     Vascular: No JVD.     Trachea: No tracheal deviation.  Cardiovascular:     Rate and Rhythm: Normal rate and regular rhythm.     Heart sounds: Normal heart sounds.  Pulmonary:     Effort: No respiratory distress.     Breath sounds: No stridor. No wheezing.  Abdominal:     General: Bowel sounds are normal. There is no distension.     Palpations: Abdomen is soft. There is no mass.     Tenderness: There is no abdominal tenderness. There is no guarding or rebound.  Musculoskeletal:        General: No tenderness or edema.     Cervical back: Normal range of motion and neck supple.  Lymphadenopathy:     Cervical: No cervical adenopathy.  Skin:    Findings: No erythema or rash.  Neurological:     Cranial Nerves: No cranial nerve deficit.     Motor: No abnormal muscle tone.     Coordination: Coordination normal.     Deep Tendon Reflexes: Reflexes normal.  Psychiatric:        Mood and Affect: Mood and affect normal.        Behavior: Behavior normal.        Thought Content: Thought content normal.        Judgment: Judgment normal.     Lab Results  Component Value Date   WBC 5.6 03/04/2020   HGB 13.5 03/04/2020   HCT 41.5 03/04/2020   PLT 225 03/04/2020   GLUCOSE 173 (H) 03/04/2020   CHOL 176 11/18/2018   TRIG 75.0 11/18/2018   HDL 72.20 11/18/2018   LDLDIRECT 105.5  11/26/2010   LDLCALC 89 11/18/2018   ALT 12 05/02/2019   AST 19 05/02/2019   NA 138 03/04/2020   K 3.6 03/04/2020   CL 99 03/04/2020   CREATININE 0.94 03/04/2020   BUN 13  03/04/2020   CO2 27 03/04/2020   TSH 2.445 03/04/2020   INR 2.3 03/21/2020   HGBA1C 7.4 (H) 02/07/2020    DG Chest 2 View  Result Date: 03/04/2020 CLINICAL DATA:  Chest pain, palpitations EXAM: CHEST - 2 VIEW COMPARISON:  Radiograph 10/30/2019 FINDINGS: Some mildly coarsened interstitial bronchitic changes are similar to comparison exams. No consolidative opacity or convincing features of edema. No pneumothorax or effusion. The cardiomediastinal contours are unremarkable. No acute osseous or soft tissue abnormality. Telemetry leads overlie the chest. IMPRESSION: Stable exam. No acute cardiopulmonary abnormality. Electronically Signed   By: Lovena Le M.D.   On: 03/04/2020 03:33    Assessment & Plan:   Walker Kehr, MD

## 2020-03-28 NOTE — Assessment & Plan Note (Signed)
Vit D 

## 2020-03-28 NOTE — Assessment & Plan Note (Signed)
Prandin dropped CBGs to 66 once - she stopped it. On diet

## 2020-03-28 NOTE — Assessment & Plan Note (Signed)
Palpitations episode - resolved

## 2020-04-13 ENCOUNTER — Other Ambulatory Visit: Payer: Self-pay | Admitting: Internal Medicine

## 2020-04-17 ENCOUNTER — Other Ambulatory Visit: Payer: Self-pay

## 2020-04-17 ENCOUNTER — Encounter (HOSPITAL_COMMUNITY): Payer: Self-pay | Admitting: Emergency Medicine

## 2020-04-17 DIAGNOSIS — Z955 Presence of coronary angioplasty implant and graft: Secondary | ICD-10-CM | POA: Insufficient documentation

## 2020-04-17 DIAGNOSIS — Z7982 Long term (current) use of aspirin: Secondary | ICD-10-CM | POA: Insufficient documentation

## 2020-04-17 DIAGNOSIS — Z79899 Other long term (current) drug therapy: Secondary | ICD-10-CM | POA: Insufficient documentation

## 2020-04-17 DIAGNOSIS — R059 Cough, unspecified: Secondary | ICD-10-CM | POA: Insufficient documentation

## 2020-04-17 DIAGNOSIS — R110 Nausea: Secondary | ICD-10-CM | POA: Insufficient documentation

## 2020-04-17 DIAGNOSIS — Z7901 Long term (current) use of anticoagulants: Secondary | ICD-10-CM | POA: Insufficient documentation

## 2020-04-17 DIAGNOSIS — Z20822 Contact with and (suspected) exposure to covid-19: Secondary | ICD-10-CM | POA: Diagnosis not present

## 2020-04-17 DIAGNOSIS — I10 Essential (primary) hypertension: Secondary | ICD-10-CM | POA: Insufficient documentation

## 2020-04-17 DIAGNOSIS — I2510 Atherosclerotic heart disease of native coronary artery without angina pectoris: Secondary | ICD-10-CM | POA: Insufficient documentation

## 2020-04-17 LAB — POC SARS CORONAVIRUS 2 AG -  ED: SARS Coronavirus 2 Ag: NEGATIVE

## 2020-04-17 NOTE — ED Triage Notes (Signed)
Per pt, states dry cough and fatigue since yesterday-states her son tested positive yesterday

## 2020-04-18 ENCOUNTER — Emergency Department (HOSPITAL_COMMUNITY)
Admission: EM | Admit: 2020-04-18 | Discharge: 2020-04-18 | Disposition: A | Discharge status: Home / Self Care | Payer: Medicare HMO | Attending: Emergency Medicine | Admitting: Emergency Medicine

## 2020-04-18 DIAGNOSIS — R110 Nausea: Secondary | ICD-10-CM

## 2020-04-18 DIAGNOSIS — R059 Cough, unspecified: Secondary | ICD-10-CM

## 2020-04-18 MED ORDER — ONDANSETRON 4 MG PO TBDP: ORAL_TABLET | ORAL | 0 refills | Status: DC

## 2020-04-18 NOTE — ED Provider Notes (Signed)
[facility]-EMERGENCY DEPT Provider Note   CSN: [id number] Arrival date & time: 04/17/20  1743     History Chief Complaint  Patient presents with  . Cough    [name] is a 86 y.o. female.  86 year old female who is here for COVID test.  She states that she has a son that tested positive for COVID and she was exposed to him.  She had a dry cough but no fevers.  Nausea after she arrived here which then subsequently subsided pretty quickly.  She been for 8 hours now without any nausea.  No diarrhea.  No constipation.  She overall feels well.   Cough      Past Medical History:  Diagnosis Date  . Anxiety state, unspecified   . Bronchitis, not specified as acute or chronic   . Diaphragmatic hernia without mention of obstruction or gangrene   . Dyslipidemia   . Essential hypertension   . Irritable bowel syndrome   . Mitral valve disorders(424.0)    a. 08/2010 Echo: EF >55%, mild MR, mod TR, trace AI.  [name] Non-obstructive CAD    a. 07/2010 Lexiscan MV: no ischemia, EF 78%; b. 07/2010 Cath: LM nl, LAD50/60p, 40 apical, LCX nl, RCA dominant, 30/40p, 15m.  . Osteoarthrosis, unspecified whether generalized or localized, unspecified site   . Osteoporosis, unspecified   . Other abnormal glucose   . PAF (paroxysmal atrial fibrillation) (HCC)    a. CHA2DS2VASc = 4-->coumadin.  [name] Phlebitis and thrombophlebitis of superficial vessels of lower extremities   . PSVT (paroxysmal supraventricular tachycardia) (HCC)    a. 05/2012 Holter: short bursts of PSVT noted-->Managed with toprol.  [name] Unspecified venous (peripheral) insufficiency   . Unspecified vitamin D deficiency     Patient Active Problem List   Diagnosis Date Noted  . Urinary frequency 02/28/2020  . Peroneal tendinitis 11/01/2019  . Spontaneous rupture of extensor tendon of left foot 10/28/2019  . Contusion of left foot 10/27/2019  . Sinusitis 04/13/2019  . Rash 12/05/2016  . Dyspnea 04/24/2016  .  Tricuspid valve insufficiency 04/24/2016  . Cerumen impaction 11/19/2015  . Ear discomfort 11/13/2015  . Grief at loss of child 09/14/2015  . Well adult exam 09/14/2015  . Dyslipidemia   . PSVT (paroxysmal supraventricular tachycardia) (HCC)   . PAF (paroxysmal atrial fibrillation) (HCC)   . Viral gastroenteritis 07/21/2015  . Acute upper respiratory infection 04/17/2015  . Edema 11/08/2014  . Chronic venous insufficiency 11/08/2014  . Diarrhea 05/01/2014  . Mass of left thigh 12/14/2013  . Microhematuria 12/14/2013  . Long term (current) use of anticoagulants 08/19/2013  . CAD (coronary artery disease) 05/21/2011  . Atrial tachycardia (HCC) 11/19/2010  . CHEST PAIN 08/07/2009  . Vitamin D deficiency 04/19/2008  . SUPERFICIAL THROMBOPHLEBITIS 10/20/2007  . DM2 (diabetes mellitus, type 2) (HCC) 03/16/2007  . HYPERCHOLESTEROLEMIA 03/15/2007  . Anxiety 03/15/2007  . Essential hypertension 03/15/2007  . Mitral valve disorder 03/15/2007  . BRONCHITIS 03/15/2007  . HIATAL HERNIA 03/15/2007  . Irritable bowel syndrome 03/15/2007  . Osteoarthritis 03/15/2007  . Osteoporosis 03/15/2007    Past Surgical History:  Procedure Laterality Date  . CARDIAC CATHETERIZATION  07/30/2010   nonobstructive CAD, 20-40% RCA stenosis, 50-60% eccentric LAD stenosis more prox, 40% LAD stenosis more distal, EF 65%  . TRANSTHORACIC ECHOCARDIOGRAM  09/03/2010   EF=>55%; mild MR; mod TR; AV mildly sclerotic with trace regurg; mild pulm valve regurg  . VESICOVAGINAL FISTULA CLOSURE W/ TAH  OB History   No obstetric history on file.     Family History  Problem Relation Age of Onset  . Stroke Mother   . Stroke Father        also heart disease  . Clotting disorder Paternal Grandmother        blood clot  . Stroke Brother        also heart disease    Social History   Tobacco Use  . Smoking status: Never Smoker  . Smokeless tobacco: Never Used  Vaping Use  . Vaping Use: Never used   Substance Use Topics  . Alcohol use: No  . Drug use: No    Home Medications Prior to Admission medications   Medication Sig Start Date End Date Taking? Authorizing Provider  ondansetron (ZOFRAN ODT) 4 MG disintegrating tablet 4mg  ODT q4 hours prn nausea/vomit 04/18/20  Yes Zeriah Baysinger, Corene Cornea, MD  ACCU-CHEK FASTCLIX LANCETS MISC Use to check blood sugars twice a day Dx E11.9 07/18/15   Plotnikov, Evie Lacks, MD  Alcohol Swabs (B-D SINGLE USE SWABS REGULAR) PADS Use to clean injection site to check blood sugars daily 05/16/19   Plotnikov, Evie Lacks, MD  ALPRAZolam (XANAX) 0.5 MG tablet TAKE 1/2 TO 1 TABLET BY MOUTH THREE TIMES DAILY AS NEEDED FOR ANXIETY. DO NOT TAKE WITH TRAMADOL.THIS IS A 30 DAY SUPPLY 12/20/19   Plotnikov, Evie Lacks, MD  amLODipine (NORVASC) 5 MG tablet Take 1 tablet (5 mg total) by mouth daily. 06/28/19   Plotnikov, Evie Lacks, MD  aspirin 81 MG tablet Take 81 mg by mouth daily.    [provider]  Cholecalciferol (VITAMIN D3) 50 MCG (2000 UT) capsule Take 1 capsule (2,000 Units total) by mouth daily. 08/02/19   Plotnikov, Evie Lacks, MD  clidinium-chlordiazePOXIDE (LIBRAX) 5-2.5 MG capsule TAKE ONE CAPSULE BY MOUTH THREE TIMES DAILY AS NEEDED FOR CRAMPS 05/02/19   Plotnikov, Evie Lacks, MD  diazepam (VALIUM) 5 MG tablet Take by mouth. 08/31/19   [provider]  diclofenac Sodium (VOLTAREN) 1 % GEL Apply 2 g topically 4 (four) times daily. 11/01/19   Aundra Dubin, PA-C  furosemide (LASIX) 20 MG tablet TAKE 1 TABLET(20 MG) BY MOUTH DAILY AS NEEDED FOR SWELLING 02/22/20   Hilty, Nadean Corwin, MD  glucose blood (ACCU-CHEK AVIVA PLUS) test strip Use to check blood sugars twice a day 07/15/18   Plotnikov, Evie Lacks, MD  hydroxypropyl methylcellulose (ISOPTO TEARS) 2.5 % ophthalmic solution Place 1 drop into both eyes 2 (two) times daily.    [provider]  hydrOXYzine (ATARAX/VISTARIL) 25 MG tablet Take 1 tablet (25 mg total) by mouth every 8 (eight) hours as needed for  itching. 01/19/19   Plotnikov, Evie Lacks, MD  isosorbide mononitrate (IMDUR) 30 MG 24 hr tablet TAKE 1 TABLET BY MOUTH EVERY DAY 07/05/19   Hilty, Nadean Corwin, MD  Lancets (ACCU-CHEK SOFT TOUCH) lancets Use as instructed 05/16/19   Plotnikov, Evie Lacks, MD  metoprolol succinate (TOPROL-XL) 50 MG 24 hr tablet TAKE 1 TABLET(50 MG) BY MOUTH TWICE DAILY 04/16/20   Hilty, Nadean Corwin, MD  pantoprazole (PROTONIX) 40 MG tablet TAKE 1 TABLET BY MOUTH EVERY DAY 06/06/19   Hilty, Nadean Corwin, MD  simvastatin (ZOCOR) 20 MG tablet TAKE 1 TABLET BY MOUTH AT BEDTIME 02/16/20   Plotnikov, Evie Lacks, MD  traMADol (ULTRAM) 50 MG tablet Take 1 tablet (50 mg total) by mouth 3 (three) times daily. 09/21/18   Plotnikov, Evie Lacks, MD  warfarin (COUMADIN) 5 MG  tablet TAKE 1 TABLET BY MOUTH EVERY DAY AS DIRECTED BY COUMADIN CLINIC 03/23/20   Hilty, Nadean Corwin, MD    Allergies    Clarithromycin, Fosamax [alendronate sodium], Guaifenesin er, Metformin, Prandin [repaglinide], and Tape  Review of Systems   Review of Systems  Respiratory: Positive for cough.   All other systems reviewed and are negative.   Physical Exam Updated Vital Signs BP (!) 154/85 (BP Location: Left Arm)   Pulse 70   Temp 98.5 F (36.9 C) (Oral)   Resp 16   SpO2 98%   Physical Exam Vitals and nursing note reviewed.  Constitutional:      Appearance: She is well-developed and well-nourished.  HENT:     Head: Normocephalic and atraumatic.     Nose: No congestion or rhinorrhea.     Mouth/Throat:     Pharynx: No oropharyngeal exudate or posterior oropharyngeal erythema.  Eyes:     Pupils: Pupils are equal, round, and reactive to light.  Cardiovascular:     Rate and Rhythm: Normal rate and regular rhythm.  Pulmonary:     Effort: No respiratory distress.     Breath sounds: No stridor. No wheezing or rhonchi.  Abdominal:     General: There is no distension.  Musculoskeletal:        General: No swelling or tenderness. Normal range of motion.      Cervical back: Normal range of motion.  Skin:    General: Skin is warm and dry.  Neurological:     Mental Status: She is alert.     ED Results / Procedures / Treatments   Labs (all labs ordered are listed, but only abnormal results are displayed) Labs Reviewed  POC SARS CORONAVIRUS 2 AG -  ED    EKG None  Radiology No results found.  Procedures Procedures (including critical care time)  Medications Ordered in ED Medications - No data to display  ED Course  I have reviewed the triage vital signs and the nursing notes.  Pertinent labs & imaging results that were available during my care of the patient were reviewed by me and considered in my medical decision making (see chart for details).    MDM Rules/Calculators/A&P                          Patient has multiple medical problems however does not appear to be in acute heart failure, bronchitis or other issues.  I offered to do some labs and an x-ray to verify these however she states she is mostly here for COVID test.  Her COVID test is negative.  Her vital signs are within normal limits.  She is competent to make the decision to forego any further testing and I feel like it is appropriate at this time.  She will follow-up with her PCP if not improved in 2 to 3 days.  Not nauseous now we will give prescription for some Zofran in case that comes back.  Final Clinical Impression(s) / ED Diagnoses Final diagnoses:  Cough  Nausea    Rx / DC Orders ED Discharge Orders         Ordered    ondansetron (ZOFRAN ODT) 4 MG disintegrating tablet        04/18/20 0159           Beldon Nowling, Corene Cornea, MD 04/18/20 0201

## 2020-04-27 ENCOUNTER — Telehealth (INDEPENDENT_AMBULATORY_CARE_PROVIDER_SITE_OTHER): Payer: Medicare HMO | Admitting: Internal Medicine

## 2020-04-27 ENCOUNTER — Other Ambulatory Visit: Payer: Self-pay

## 2020-04-27 ENCOUNTER — Telehealth: Payer: Self-pay | Admitting: Internal Medicine

## 2020-04-27 ENCOUNTER — Encounter: Payer: Self-pay | Admitting: Internal Medicine

## 2020-04-27 DIAGNOSIS — E860 Dehydration: Secondary | ICD-10-CM | POA: Diagnosis not present

## 2020-04-27 DIAGNOSIS — K529 Noninfective gastroenteritis and colitis, unspecified: Secondary | ICD-10-CM

## 2020-04-27 DIAGNOSIS — I10 Essential (primary) hypertension: Secondary | ICD-10-CM

## 2020-04-27 MED ORDER — PEDIALYTE PO PACK: PACK | ORAL | 1 refills | Status: DC

## 2020-04-27 MED ORDER — ONDANSETRON HCL 4 MG PO TABS: 4.0000 mg | ORAL_TABLET | Freq: Three times a day (TID) | ORAL | 0 refills | Status: DC | PRN

## 2020-04-27 MED ORDER — DIPHENOXYLATE-ATROPINE 2.5-0.025 MG PO TABS
1.0000 | ORAL_TABLET | Freq: Four times a day (QID) | ORAL | 0 refills | Status: DC | PRN
Start: 2020-04-27 — End: 2020-06-01

## 2020-04-27 NOTE — Telephone Encounter (Signed)
Patient wondering what medicine he didn't want to take her to take. Was unsure what the name was but she thinks its her pressure medication, and how long he wants her to stop. Patient states she isnt going to take her medicine until she find out so she doenst mess anything up.

## 2020-04-27 NOTE — Assessment & Plan Note (Addendum)
Acute Discussed oral re-hydration Go to ER if worse for IVF Zofran tabs prn Lomotil prn Hold BP meds until better Treat vomiting/diarrhea

## 2020-04-27 NOTE — Telephone Encounter (Signed)
Hold metoprolol, furosemide, amlodipine until vomiting and diarrhea have stopped. Thanks

## 2020-04-27 NOTE — Progress Notes (Signed)
Virtual Visit via Telephone Note  I connected with [name] on 04/27/20 at 10:40 AM EST by telephone and verified that I am speaking with the correct person using two identifiers.  Location: Patient: home Provider: [facility]   I discussed the limitations, risks, security and privacy concerns of performing an evaluation and management service by telephone and the availability of in person appointments. I also discussed with the patient that there may be a patient responsible charge related to this service. The patient expressed understanding and agreed to proceed.   History of Present Illness: C/o n/v/d since 1/5. She went to ER - COVID (-). Zofran ODT was too $$$ [name] did not buy it. C/o chills.   Observations/Objective:  The pt sounds tired on the phone. A/o/c. Assessment and Plan:  See plan Follow Up Instructions:    I discussed the assessment and treatment plan with the patient. The patient was provided an opportunity to ask questions and all were answered. The patient agreed with the plan and demonstrated an understanding of the instructions.   The patient was advised to call back or seek an in-person evaluation if the symptoms worsen or if the condition fails to improve as anticipated.  I provided 23 minutes of non-face-to-face time during this encounter.   Walker Kehr, MD

## 2020-04-27 NOTE — Assessment & Plan Note (Signed)
Acute Discussed oral re-hydration Go to ER if worse for IVF Zofran tabs prn Lomotil prn

## 2020-04-27 NOTE — Telephone Encounter (Signed)
Pt notified of Dr Plotinkov's response.  Had to repeat multiple times, but verb understanding.

## 2020-04-27 NOTE — Assessment & Plan Note (Signed)
  Hold BP meds until better Treat vomiting/diarrhea

## 2020-05-01 ENCOUNTER — Encounter (HOSPITAL_COMMUNITY): Payer: Self-pay | Admitting: Emergency Medicine

## 2020-05-01 ENCOUNTER — Emergency Department (HOSPITAL_COMMUNITY): Payer: Medicare HMO

## 2020-05-01 ENCOUNTER — Emergency Department (HOSPITAL_COMMUNITY)
Admission: EM | Admit: 2020-05-01 | Discharge: 2020-05-01 | Disposition: A | Discharge status: Home / Self Care | Payer: Medicare HMO | Attending: Emergency Medicine | Admitting: Emergency Medicine

## 2020-05-01 ENCOUNTER — Other Ambulatory Visit: Payer: Self-pay

## 2020-05-01 DIAGNOSIS — I2510 Atherosclerotic heart disease of native coronary artery without angina pectoris: Secondary | ICD-10-CM | POA: Insufficient documentation

## 2020-05-01 DIAGNOSIS — E119 Type 2 diabetes mellitus without complications: Secondary | ICD-10-CM | POA: Diagnosis not present

## 2020-05-01 DIAGNOSIS — Z79899 Other long term (current) drug therapy: Secondary | ICD-10-CM | POA: Insufficient documentation

## 2020-05-01 DIAGNOSIS — U071 COVID-19: Secondary | ICD-10-CM | POA: Insufficient documentation

## 2020-05-01 DIAGNOSIS — Z7901 Long term (current) use of anticoagulants: Secondary | ICD-10-CM | POA: Diagnosis not present

## 2020-05-01 DIAGNOSIS — R110 Nausea: Secondary | ICD-10-CM

## 2020-05-01 DIAGNOSIS — Z7982 Long term (current) use of aspirin: Secondary | ICD-10-CM | POA: Insufficient documentation

## 2020-05-01 DIAGNOSIS — I10 Essential (primary) hypertension: Secondary | ICD-10-CM | POA: Diagnosis not present

## 2020-05-01 DIAGNOSIS — R519 Headache, unspecified: Secondary | ICD-10-CM | POA: Diagnosis not present

## 2020-05-01 LAB — RESP PANEL BY RT-PCR (FLU A&B, COVID) ARPGX2
Influenza A by PCR: NEGATIVE
Influenza B by PCR: NEGATIVE
SARS Coronavirus 2 by RT PCR: POSITIVE — AB

## 2020-05-01 LAB — BASIC METABOLIC PANEL
Anion gap: 8 (ref 5–15)
BUN: 6 mg/dL — ABNORMAL LOW (ref 8–23)
CO2: 30 mmol/L (ref 22–32)
Calcium: 9 mg/dL (ref 8.9–10.3)
Chloride: 100 mmol/L (ref 98–111)
Creatinine, Ser: 0.9 mg/dL (ref 0.44–1.00)
GFR, Estimated: >60 mL/min (ref >60)
Glucose, Bld: 140 mg/dL — ABNORMAL HIGH (ref 70–99)
Potassium: 3.8 mmol/L (ref 3.5–5.1)
Sodium: 138 mmol/L (ref 135–145)

## 2020-05-01 LAB — HEPATIC FUNCTION PANEL
ALT: 10 U/L (ref 0–44)
AST: 17 U/L (ref 15–41)
Albumin: 3.9 g/dL (ref 3.5–5.0)
Alkaline Phosphatase: 53 U/L (ref 38–126)
Bilirubin, Direct: 0.1 mg/dL (ref 0.0–0.2)
Indirect Bilirubin: 0.7 mg/dL (ref 0.3–0.9)
Total Bilirubin: 0.8 mg/dL (ref 0.3–1.2)
Total Protein: 7.5 g/dL (ref 6.5–8.1)

## 2020-05-01 LAB — CBC WITH DIFFERENTIAL/PLATELET
Abs Immature Granulocytes: 0.01 10*3/uL (ref 0.00–0.07)
Basophils Absolute: 0 10*3/uL (ref 0.0–0.1)
Basophils Relative: 0 %
Eosinophils Absolute: 0.2 10*3/uL (ref 0.0–0.5)
Eosinophils Relative: 3 %
HCT: 35.6 % — ABNORMAL LOW (ref 36.0–46.0)
Hemoglobin: 11.7 g/dL — ABNORMAL LOW (ref 12.0–15.0)
Immature Granulocytes: 0 %
Lymphocytes Relative: 21 %
Lymphs Abs: 1 10*3/uL (ref 0.7–4.0)
MCH: 29 pg (ref 26.0–34.0)
MCHC: 32.9 g/dL (ref 30.0–36.0)
MCV: 88.1 fL (ref 80.0–100.0)
Monocytes Absolute: 0.6 10*3/uL (ref 0.1–1.0)
Monocytes Relative: 12 %
Neutro Abs: 3.1 10*3/uL (ref 1.7–7.7)
Neutrophils Relative %: 64 %
Platelets: 255 10*3/uL (ref 150–400)
RBC: 4.04 MIL/uL (ref 3.87–5.11)
RDW: 12.1 % (ref 11.5–15.5)
WBC: 4.9 10*3/uL (ref 4.0–10.5)
nRBC: 0 % (ref 0.0–0.2)

## 2020-05-01 LAB — URINALYSIS, ROUTINE W REFLEX MICROSCOPIC
Bacteria, UA: NONE SEEN
Bilirubin Urine: NEGATIVE
Glucose, UA: NEGATIVE mg/dL
Ketones, ur: NEGATIVE mg/dL
Leukocytes,Ua: NEGATIVE
Nitrite: NEGATIVE
Protein, ur: NEGATIVE mg/dL
Specific Gravity, Urine: 1.002 — ABNORMAL LOW (ref 1.005–1.030)
pH: 8 (ref 5.0–8.0)

## 2020-05-01 LAB — PROTIME-INR
INR: 1.7 — ABNORMAL HIGH (ref 0.8–1.2)
Prothrombin Time: 19.3 seconds — ABNORMAL HIGH (ref 11.4–15.2)

## 2020-05-01 LAB — LIPASE, BLOOD: Lipase: 25 U/L (ref 11–51)

## 2020-05-01 LAB — LACTIC ACID, PLASMA: Lactic Acid, Venous: 1.2 mmol/L (ref 0.5–1.9)

## 2020-05-01 MED ORDER — ACETAMINOPHEN 500 MG PO TABS
1000.0000 mg | ORAL_TABLET | Freq: Once | ORAL | Status: AC
  Administered 2020-05-01: 1000 mg via ORAL
  Filled 2020-05-01: qty 2

## 2020-05-01 MED ORDER — ONDANSETRON 4 MG PO TBDP: 4.0000 mg | ORAL_TABLET | Freq: Three times a day (TID) | ORAL | 0 refills | Status: DC | PRN

## 2020-05-01 MED ORDER — ONDANSETRON HCL 4 MG/2ML IJ SOLN
4.0000 mg | Freq: Once | INTRAMUSCULAR | Status: AC
  Administered 2020-05-01: 4 mg via INTRAVENOUS
  Filled 2020-05-01: qty 2

## 2020-05-01 MED ORDER — LACTATED RINGERS IV BOLUS
1000.0000 mL | Freq: Once | INTRAVENOUS | Status: AC
  Administered 2020-05-01: 1000 mL via INTRAVENOUS

## 2020-05-01 NOTE — ED Provider Notes (Addendum)
[place] DEPT Provider Note   CSN: [id number] Arrival date & time: 05/01/20  1109     History Chief Complaint  Patient presents with  . Fatigue  . Nausea  . Headache    [name] is a 86 y.o. female.   Headache Pain location:  Generalized Quality:  Dull Radiates to:  Does not radiate Onset quality:  Gradual Timing:  Constant Progression:  Worsening Context comment:  At rest Relieved by:  Nothing Worsened by:  Nothing Ineffective treatments:  None tried Associated symptoms: fatigue and nausea   Associated symptoms: no back pain, no congestion, no cough, no diarrhea, no dizziness, no fever and no vomiting        Past Medical History:  Diagnosis Date  . Anxiety state, unspecified   . Bronchitis, not specified as acute or chronic   . Diaphragmatic hernia without mention of obstruction or gangrene   . Dyslipidemia   . Essential hypertension   . Irritable bowel syndrome   . Mitral valve disorders(424.0)    a. 08/2010 Echo: EF >55%, mild MR, mod TR, trace AI.  [name] Non-obstructive CAD    a. 07/2010 Lexiscan MV: no ischemia, EF 78%; b. 07/2010 Cath: LM nl, LAD50/60p, 40 apical, LCX nl, RCA dominant, 30/40p, 41m.  . Osteoarthrosis, unspecified whether generalized or localized, unspecified site   . Osteoporosis, unspecified   . Other abnormal glucose   . PAF (paroxysmal atrial fibrillation) (HCC)    a. CHA2DS2VASc = 4-->coumadin.  [name] Phlebitis and thrombophlebitis of superficial vessels of lower extremities   . PSVT (paroxysmal supraventricular tachycardia) ([place])    a. 05/2012 Holter: short bursts of PSVT noted-->Managed with toprol.  [name] Unspecified venous (peripheral) insufficiency   . Unspecified vitamin D deficiency     Patient Active Problem List   Diagnosis Date Noted  . Gastroenteritis 04/27/2020  . Dehydration 04/27/2020  . Urinary frequency 02/28/2020  . Peroneal tendinitis 11/01/2019  . Spontaneous rupture of extensor  tendon of left foot 10/28/2019  . Contusion of left foot 10/27/2019  . Sinusitis 04/13/2019  . Rash 12/05/2016  . Dyspnea 04/24/2016  . Tricuspid valve insufficiency 04/24/2016  . Cerumen impaction 11/19/2015  . Ear discomfort 11/13/2015  . Grief at loss of child 09/14/2015  . Well adult exam 09/14/2015  . Dyslipidemia   . PSVT (paroxysmal supraventricular tachycardia) ([place])   . PAF (paroxysmal atrial fibrillation) ([place])   . Viral gastroenteritis 07/21/2015  . Acute upper respiratory infection 04/17/2015  . Edema 11/08/2014  . Chronic venous insufficiency 11/08/2014  . Diarrhea 05/01/2014  . Mass of left thigh 12/14/2013  . Microhematuria 12/14/2013  . Long term (current) use of anticoagulants 08/19/2013  . CAD (coronary artery disease) 05/21/2011  . Atrial tachycardia ([place]) 11/19/2010  . CHEST PAIN 08/07/2009  . Vitamin D deficiency 04/19/2008  . SUPERFICIAL THROMBOPHLEBITIS 10/20/2007  . DM2 (diabetes mellitus, type 2) ([place]) 03/16/2007  . HYPERCHOLESTEROLEMIA 03/15/2007  . Anxiety 03/15/2007  . Essential hypertension 03/15/2007  . Mitral valve disorder 03/15/2007  . BRONCHITIS 03/15/2007  . HIATAL HERNIA 03/15/2007  . Irritable bowel syndrome 03/15/2007  . Osteoarthritis 03/15/2007  . Osteoporosis 03/15/2007    Past Surgical History:  Procedure Laterality Date  . CARDIAC CATHETERIZATION  07/30/2010   nonobstructive CAD, 20-40% RCA stenosis, 50-60% eccentric LAD stenosis more prox, 40% LAD stenosis more distal, EF 65%  . TRANSTHORACIC ECHOCARDIOGRAM  09/03/2010   EF=>55%; mild MR; mod TR; AV mildly sclerotic with trace regurg; mild pulm valve regurg  .  VESICOVAGINAL FISTULA CLOSURE W/ TAH       OB History   No obstetric history on file.     Family History  Problem Relation Age of Onset  . Stroke Mother   . Stroke Father        also heart disease  . Clotting disorder Paternal Grandmother        blood clot  . Stroke Brother        also heart disease     Social History   Tobacco Use  . Smoking status: Never Smoker  . Smokeless tobacco: Never Used  Vaping Use  . Vaping Use: Never used  Substance Use Topics  . Alcohol use: No  . Drug use: No    Home Medications Prior to Admission medications   Medication Sig Start Date End Date Taking? Authorizing Provider  ondansetron (ZOFRAN ODT) 4 MG disintegrating tablet Take 1 tablet (4 mg total) by mouth every 8 (eight) hours as needed for up to 10 doses for nausea or vomiting. 05/01/20  Yes Breck Coons, MD  ACCU-CHEK FASTCLIX LANCETS MISC Use to check blood sugars twice a day Dx E11.9 07/18/15   Plotnikov, Evie Lacks, MD  Alcohol Swabs (B-D SINGLE USE SWABS REGULAR) PADS Use to clean injection site to check blood sugars daily 05/16/19   Plotnikov, Evie Lacks, MD  ALPRAZolam (XANAX) 0.5 MG tablet TAKE 1/2 TO 1 TABLET BY MOUTH THREE TIMES DAILY AS NEEDED FOR ANXIETY. DO NOT TAKE WITH TRAMADOL.THIS IS A 30 DAY SUPPLY 12/20/19   Plotnikov, Evie Lacks, MD  amLODipine (NORVASC) 5 MG tablet Take 1 tablet (5 mg total) by mouth daily. 06/28/19   Plotnikov, Evie Lacks, MD  aspirin 81 MG tablet Take 81 mg by mouth daily.    [provider]  Cholecalciferol (VITAMIN D3) 50 MCG (2000 UT) capsule Take 1 capsule (2,000 Units total) by mouth daily. 08/02/19   Plotnikov, Evie Lacks, MD  clidinium-chlordiazePOXIDE (LIBRAX) 5-2.5 MG capsule TAKE ONE CAPSULE BY MOUTH THREE TIMES DAILY AS NEEDED FOR CRAMPS 05/02/19   Plotnikov, Evie Lacks, MD  diazepam (VALIUM) 5 MG tablet Take by mouth. 08/31/19   [provider]  diclofenac Sodium (VOLTAREN) 1 % GEL Apply 2 g topically 4 (four) times daily. 11/01/19   Aundra Dubin, PA-C  diphenoxylate-atropine (LOMOTIL) 2.5-0.025 MG tablet Take 1 tablet by mouth 4 (four) times daily as needed for diarrhea or loose stools. 04/27/20   Plotnikov, Evie Lacks, MD  furosemide (LASIX) 20 MG tablet TAKE 1 TABLET(20 MG) BY MOUTH DAILY AS NEEDED FOR SWELLING 02/22/20   Hilty, Nadean Corwin,  MD  glucose blood (ACCU-CHEK AVIVA PLUS) test strip Use to check blood sugars twice a day 07/15/18   Plotnikov, Evie Lacks, MD  hydroxypropyl methylcellulose (ISOPTO TEARS) 2.5 % ophthalmic solution Place 1 drop into both eyes 2 (two) times daily.    [provider]  hydrOXYzine (ATARAX/VISTARIL) 25 MG tablet Take 1 tablet (25 mg total) by mouth every 8 (eight) hours as needed for itching. 01/19/19   Plotnikov, Evie Lacks, MD  isosorbide mononitrate (IMDUR) 30 MG 24 hr tablet TAKE 1 TABLET BY MOUTH EVERY DAY 07/05/19   Hilty, Nadean Corwin, MD  Lancets (ACCU-CHEK SOFT TOUCH) lancets Use as instructed 05/16/19   Plotnikov, Evie Lacks, MD  metoprolol succinate (TOPROL-XL) 50 MG 24 hr tablet TAKE 1 TABLET(50 MG) BY MOUTH TWICE DAILY 04/16/20   Hilty, Nadean Corwin, MD  ondansetron (ZOFRAN) 4 MG tablet Take 1 tablet (4 mg total)  by mouth every 8 (eight) hours as needed for nausea or vomiting. 04/27/20   Plotnikov, Evie Lacks, MD  Oral Electrolytes (PEDIALYTE) PACK As directed for dehydration 04/27/20   Plotnikov, Evie Lacks, MD  pantoprazole (PROTONIX) 40 MG tablet TAKE 1 TABLET BY MOUTH EVERY DAY 06/06/19   Hilty, Nadean Corwin, MD  simvastatin (ZOCOR) 20 MG tablet TAKE 1 TABLET BY MOUTH AT BEDTIME 02/16/20   Plotnikov, Evie Lacks, MD  traMADol (ULTRAM) 50 MG tablet Take 1 tablet (50 mg total) by mouth 3 (three) times daily. 09/21/18   Plotnikov, Evie Lacks, MD  warfarin (COUMADIN) 5 MG tablet TAKE 1 TABLET BY MOUTH EVERY DAY AS DIRECTED BY COUMADIN CLINIC 03/23/20   Hilty, Nadean Corwin, MD    Allergies    Clarithromycin, Fosamax [alendronate sodium], Guaifenesin er, Metformin, Prandin [repaglinide], and Tape  Review of Systems   Review of Systems  Constitutional: Positive for fatigue. Negative for chills and fever.  HENT: Negative for congestion and rhinorrhea.   Respiratory: Negative for cough and shortness of breath.   Cardiovascular: Negative for chest pain and palpitations.  Gastrointestinal: Positive for nausea.  Negative for diarrhea and vomiting.  Genitourinary: Negative for difficulty urinating and dysuria.  Musculoskeletal: Negative for arthralgias and back pain.  Skin: Negative for rash and wound.  Neurological: Positive for light-headedness and headaches. Negative for dizziness.    Physical Exam Updated Vital Signs BP (!) 146/68   Pulse 71   Temp 98.1 F (36.7 C) (Oral)   Resp (!) 21   Ht 5\' 6"  (1.676 m)   Wt 84.4 kg   SpO2 96%   BMI 30.02 kg/m   Physical Exam Vitals and nursing note reviewed. Exam conducted with a chaperone present.  Constitutional:      General: She is not in acute distress.    Appearance: Normal appearance.  HENT:     Head: Normocephalic and atraumatic.     Nose: No rhinorrhea.  Eyes:     General:        Right eye: No discharge.        Left eye: No discharge.     Conjunctiva/sclera: Conjunctivae normal.  Cardiovascular:     Rate and Rhythm: Normal rate and regular rhythm.     Heart sounds: No murmur heard. No gallop.   Pulmonary:     Effort: Pulmonary effort is normal. No respiratory distress.     Breath sounds: No stridor. No wheezing or rales.  Abdominal:     General: Abdomen is flat. There is no distension.     Palpations: Abdomen is soft.     Tenderness: There is no abdominal tenderness. There is no guarding.  Musculoskeletal:        General: No tenderness or signs of injury.  Skin:    General: Skin is warm and dry.  Neurological:     General: No focal deficit present.     Mental Status: She is alert. Mental status is at baseline.     Cranial Nerves: No cranial nerve deficit or dysarthria.     Motor: No weakness.  Psychiatric:        Mood and Affect: Mood normal.        Behavior: Behavior normal.     ED Results / Procedures / Treatments   Labs (all labs ordered are listed, but only abnormal results are displayed) Labs Reviewed  CBC WITH DIFFERENTIAL/PLATELET - Abnormal; Notable for the following components:      Result Value    Hemoglobin  11.7 (*)    HCT 35.6 (*)    All other components within normal limits  BASIC METABOLIC PANEL - Abnormal; Notable for the following components:   Glucose, Bld 140 (*)    BUN 6 (*)    All other components within normal limits  PROTIME-INR - Abnormal; Notable for the following components:   Prothrombin Time 19.3 (*)    INR 1.7 (*)    All other components within normal limits  URINALYSIS, ROUTINE W REFLEX MICROSCOPIC - Abnormal; Notable for the following components:   Color, Urine STRAW (*)    Specific Gravity, Urine 1.002 (*)    Hgb urine dipstick MODERATE (*)    All other components within normal limits  RESP PANEL BY RT-PCR (FLU A&B, COVID) [id number]  HEPATIC FUNCTION PANEL  LIPASE, BLOOD  LACTIC ACID, PLASMA  LACTIC ACID, PLASMA    EKG EKG Interpretation  Date/Time:  Tuesday May 01 2020 11:55:08 EST Ventricular Rate:  73 PR Interval:    QRS Duration: 93 QT Interval:  404 QTC Calculation: 446 R Axis:   -15 Text Interpretation: Sinus rhythm Borderline prolonged PR interval Left ventricular hypertrophy Anterior Q waves, possibly due to LVH Borderline T abnormalities, inferior leads Confirmed by Dewaine Conger [phone]) on 05/01/2020 3:16:24 PM   Radiology CT Head Wo Contrast  Result Date: 05/01/2020 CLINICAL DATA:  Headache. EXAM: CT HEAD WITHOUT CONTRAST TECHNIQUE: Contiguous axial images were obtained from the base of the skull through the vertex without intravenous contrast. COMPARISON:  None. FINDINGS: Brain: Mild chronic ischemic white matter disease is noted. No mass effect or midline shift is noted. Ventricular size is within normal limits. There is no evidence of mass lesion, hemorrhage or acute infarction. Vascular: No hyperdense vessel or unexpected calcification. Skull: Normal. Negative for fracture or focal lesion. Sinuses/Orbits: No acute finding. Other: None. IMPRESSION: Mild chronic ischemic white matter disease. No acute intracranial abnormality seen.  Electronically Signed   By: Marijo Conception M.D.   On: 05/01/2020 15:28    Procedures Procedures (including critical care time)  Medications Ordered in ED Medications  lactated ringers bolus 1,000 mL (1,000 mLs Intravenous New Bag/Given 05/01/20 1325)  ondansetron (ZOFRAN) injection 4 mg (4 mg Intravenous Given 05/01/20 1325)  acetaminophen (TYLENOL) tablet 1,000 mg (1,000 mg Oral Given 05/01/20 1329)    ED Course  I have reviewed the triage vital signs and the nursing notes.  Pertinent labs & imaging results that were available during my care of the patient were reviewed by me and considered in my medical decision making (see chart for details).    MDM Rules/Calculators/A&P                          Patient has diffuse headache, nausea has been going on for a while.  She is been having multiple medication changes per her doctor, says no medication for nausea.  Denies any abdominal pain or vomiting or diarrhea.  Tolerating p.o. but less appetite.  No sick contacts.  Was brought here by family.  I tried to call family but was unable to reach anybody.  Neurologic exam is unremarkable seen with benign abdominal exam.  EKG reviewed by myself shows sinus rhythm no acute ischemic change mild prolongation and QT interval, otherwise unremarkable.  Screening labs will be sent.  Patient will get a CT scan of the head as she is on blood thinners as headache and nausea.  Denies any trauma though.  Patient's laboratory  studies show mild subtherapeutic level of INR, will continue medications.  She has significant resolution of nausea.  She continues to have a benign abdominal exam.  Still waiting for CT scan of the head to be read by radiology.  So shows no signs of infection.  Chemistry shows no significant electrolyte derangements and the CBC shows no significant derangements.  COVID test still pending.  I spoke with the family at length about the patient's symptoms, they, this been going on for several  months.  She is still intermittently tolerating p.o., no signs of surgical complication.  The patient will get CT imaging and if negative be discharged home with Zofran.  Review of medical interactions between Zofran and her anticoagulation shows no significant derangement.  As for headache it is mild and there are no signs of neurologic dysfunction and CT image will of assess for any acute intracranial abnormality  CT imaging read by radiology myself shows no acute intracranial abnormality.  Chronic microvascular disease.  I discussed this with her and discussed her medications.  She feels safe with discharge home.  I spoke to the family about the plan and they agree.  I invited return anytime for recurrent symptoms or worsening symptoms.  Final Clinical Impression(s) / ED Diagnoses Final diagnoses:  Nausea in adult    Rx / DC Orders ED Discharge Orders         Ordered    ondansetron (ZOFRAN ODT) 4 MG disintegrating tablet  Every 8 hours PRN        05/01/20 [place], Adeola Dennen C, MD 05/01/20 1528    Breck Coons, MD 05/01/20 1546

## 2020-05-01 NOTE — Discharge Instructions (Addendum)
You have a prescription for nausea medicine.  Follow-up with your primary care provider for further diagnostic testing.  Your COVID test is still pending, we will call you with a positive result. The Covid test is pending at time of discharge.  Instructions on how to follow this up on my chart are on your discharge paperwork, you can also call the department if you are having trouble finding these results.  If he/she is Covid positive he/she will need to be quarantine for total 5 days since the onset of symptoms +24 hours of no fever and resolving symptoms, additionally he/she needs to wear a mask near all others for 5 more days. If he/she is not Covid positive he/she is able to go back to normal day-to-day routine as long as he/she is not having fevers and it has been 24 hours since his/her last fever.  Follow-up with your regular doctors for routine checkups and to make sure your medication interactions are safe.  The Zofran should be safe to take with your Coumadin.

## 2020-05-01 NOTE — ED Notes (Signed)
Patient was tested 2 weeks ago for COVID and was negative per patient.  Son had COVID which is why she came to get tested 2 weeks ago, now having headache and nausea.

## 2020-05-01 NOTE — ED Triage Notes (Addendum)
Patient tested COVID + 04/18/20, states she has nausea, headache and generalized fatigue / body aches now. Also complains of heart flutters.

## 2020-05-04 ENCOUNTER — Telehealth: Payer: Self-pay | Admitting: Internal Medicine

## 2020-05-04 NOTE — Telephone Encounter (Signed)
Notified pt daughter w/MD response. Daughter states mom inform her that MD told her if her BS goes up to not tke her BP medications. Inform daughter per last ov there is no document of MD saying that, only it was ok to strop taking the Prandin due to med making BS too low. She want to verify if MD wanting her to do that, or did mom misunderstood.Marland KitchenJohny Chess

## 2020-05-04 NOTE — Telephone Encounter (Signed)
For a mild COVID-19 case you can take zinc 50 mg a day for 1 week, vitamin C 1000 mg daily for 1 week, vitamin D2 50,000 units weekly for 2 months (unless you are taking vitamin D daily already), Quercetin 500 mg twice a day for 1 week (if you can get it quick enough).  Maintain good oral hydration and take Tylenol with high fever. Feel better! Thanks

## 2020-05-04 NOTE — Telephone Encounter (Signed)
Patients daughter called and said that the patient tested positive for Covid 19 on 05/03/20. She said that the patient is taking a multivitamin, she was wondering if there was anything else that should could be taking. Please call the daughter back at [phone].

## 2020-05-04 NOTE — Telephone Encounter (Signed)
Patients daughter is requesting a call back. She can be reached at [phone].

## 2020-05-05 ENCOUNTER — Other Ambulatory Visit: Payer: Self-pay

## 2020-05-05 ENCOUNTER — Encounter (HOSPITAL_COMMUNITY): Payer: Self-pay | Admitting: Urgent Care

## 2020-05-05 ENCOUNTER — Ambulatory Visit (HOSPITAL_COMMUNITY)
Admission: EM | Admit: 2020-05-05 | Discharge: 2020-05-05 | Disposition: A | Discharge status: Home / Self Care | Payer: Medicare HMO | Attending: Urgent Care | Admitting: Urgent Care

## 2020-05-05 DIAGNOSIS — R000 Tachycardia, unspecified: Secondary | ICD-10-CM | POA: Diagnosis not present

## 2020-05-05 DIAGNOSIS — R002 Palpitations: Secondary | ICD-10-CM | POA: Diagnosis not present

## 2020-05-05 DIAGNOSIS — R0602 Shortness of breath: Secondary | ICD-10-CM

## 2020-05-05 DIAGNOSIS — U071 COVID-19: Secondary | ICD-10-CM

## 2020-05-05 NOTE — ED Provider Notes (Signed)
[place]   MRN: [id number] DOB: 1935-02-23  Subjective:   [name] is a 86 y.o. female with pmh of CAD, atrial tachycardia, PSVT, PAF, tricuspid valve insufficiency presenting for heart racing, palpitations. She was seen 05/01/2020 through the emergency room, had extensive work up as below. Tested positive for COVID 19 then. Negative for the flu. Patient is on chronic anticoagulation with warfarin. Patient is COVID vaccinated.   No current facility-administered medications for this encounter.  Current Outpatient Medications:  .  ACCU-CHEK FASTCLIX LANCETS MISC, Use to check blood sugars twice a day Dx E11.9, Disp: 102 each, Rfl: 2 .  Alcohol Swabs (B-D SINGLE USE SWABS REGULAR) PADS, Use to clean injection site to check blood sugars daily, Disp: 300 each, Rfl: 3 .  ALPRAZolam (XANAX) 0.5 MG tablet, TAKE 1/2 TO 1 TABLET BY MOUTH THREE TIMES DAILY AS NEEDED FOR ANXIETY. DO NOT TAKE WITH TRAMADOL.THIS IS A 30 DAY SUPPLY, Disp: 90 tablet, Rfl: 3 .  amLODipine (NORVASC) 5 MG tablet, Take 1 tablet (5 mg total) by mouth daily., Disp: 90 tablet, Rfl: 3 .  aspirin 81 MG tablet, Take 81 mg by mouth daily., Disp: , Rfl:  .  Cholecalciferol (VITAMIN D3) 50 MCG (2000 UT) capsule, Take 1 capsule (2,000 Units total) by mouth daily., Disp: 100 capsule, Rfl: 3 .  clidinium-chlordiazePOXIDE (LIBRAX) 5-2.5 MG capsule, TAKE ONE CAPSULE BY MOUTH THREE TIMES DAILY AS NEEDED FOR CRAMPS, Disp: 20 capsule, Rfl: 2 .  diazepam (VALIUM) 5 MG tablet, Take by mouth., Disp: , Rfl:  .  diclofenac Sodium (VOLTAREN) 1 % GEL, Apply 2 g topically 4 (four) times daily., Disp: 150 g, Rfl: 1 .  diphenoxylate-atropine (LOMOTIL) 2.5-0.025 MG tablet, Take 1 tablet by mouth 4 (four) times daily as needed for diarrhea or loose stools., Disp: 60 tablet, Rfl: 0 .  furosemide (LASIX) 20 MG tablet, TAKE 1 TABLET(20 MG) BY MOUTH DAILY AS NEEDED FOR SWELLING, Disp: 30 tablet, Rfl: 3 .  glucose blood (ACCU-CHEK  AVIVA PLUS) test strip, Use to check blood sugars twice a day, Disp: 100 each, Rfl: 2 .  hydroxypropyl methylcellulose (ISOPTO TEARS) 2.5 % ophthalmic solution, Place 1 drop into both eyes 2 (two) times daily., Disp: , Rfl:  .  hydrOXYzine (ATARAX/VISTARIL) 25 MG tablet, Take 1 tablet (25 mg total) by mouth every 8 (eight) hours as needed for itching., Disp: 60 tablet, Rfl: 0 .  isosorbide mononitrate (IMDUR) 30 MG 24 hr tablet, TAKE 1 TABLET BY MOUTH EVERY DAY, Disp: 30 tablet, Rfl: 11 .  Lancets (ACCU-CHEK SOFT TOUCH) lancets, Use as instructed, Disp: 300 each, Rfl: 3 .  metoprolol succinate (TOPROL-XL) 50 MG 24 hr tablet, TAKE 1 TABLET(50 MG) BY MOUTH TWICE DAILY, Disp: 180 tablet, Rfl: 1 .  ondansetron (ZOFRAN ODT) 4 MG disintegrating tablet, Take 1 tablet (4 mg total) by mouth every 8 (eight) hours as needed for up to 10 doses for nausea or vomiting., Disp: 10 tablet, Rfl: 0 .  ondansetron (ZOFRAN) 4 MG tablet, Take 1 tablet (4 mg total) by mouth every 8 (eight) hours as needed for nausea or vomiting., Disp: 20 tablet, Rfl: 0 .  Oral Electrolytes (PEDIALYTE) PACK, As directed for dehydration, Disp: 6 each, Rfl: 1 .  pantoprazole (PROTONIX) 40 MG tablet, TAKE 1 TABLET BY MOUTH EVERY DAY, Disp: 90 tablet, Rfl: 3 .  simvastatin (ZOCOR) 20 MG tablet, TAKE 1 TABLET BY MOUTH AT BEDTIME, Disp: 90 tablet, Rfl: 1 .  traMADol (  ULTRAM) 50 MG tablet, Take 1 tablet (50 mg total) by mouth 3 (three) times daily., Disp: 90 tablet, Rfl: 3 .  warfarin (COUMADIN) 5 MG tablet, TAKE 1 TABLET BY MOUTH EVERY DAY AS DIRECTED BY COUMADIN CLINIC, Disp: 90 tablet, Rfl: 0   Allergies  Allergen Reactions  . Clarithromycin     REACTION: unspecified  . Fosamax [Alendronate Sodium]     achy  . Guaifenesin Er     itching  . Metformin     REACTION: pt states INTOL to Metformin "felt drained"  . Prandin [Repaglinide]     Prandin dropped CBGs to 66 once - she stopped it.  . Tape     Adhesive tape---rash    Past  Medical History:  Diagnosis Date  . Anxiety state, unspecified   . Bronchitis, not specified as acute or chronic   . Diaphragmatic hernia without mention of obstruction or gangrene   . Dyslipidemia   . Essential hypertension   . Irritable bowel syndrome   . Mitral valve disorders(424.0)    a. 08/2010 Echo: EF >55%, mild MR, mod TR, trace AI.  [name] Non-obstructive CAD    a. 07/2010 Lexiscan MV: no ischemia, EF 78%; b. 07/2010 Cath: LM nl, LAD50/60p, 40 apical, LCX nl, RCA dominant, 30/40p, 73m.  . Osteoarthrosis, unspecified whether generalized or localized, unspecified site   . Osteoporosis, unspecified   . Other abnormal glucose   . PAF (paroxysmal atrial fibrillation) (HCC)    a. CHA2DS2VASc = 4-->coumadin.  [name] Phlebitis and thrombophlebitis of superficial vessels of lower extremities   . PSVT (paroxysmal supraventricular tachycardia) ([place])    a. 05/2012 Holter: short bursts of PSVT noted-->Managed with toprol.  [name] Unspecified venous (peripheral) insufficiency   . Unspecified vitamin D deficiency      Past Surgical History:  Procedure Laterality Date  . CARDIAC CATHETERIZATION  07/30/2010   nonobstructive CAD, 20-40% RCA stenosis, 50-60% eccentric LAD stenosis more prox, 40% LAD stenosis more distal, EF 65%  . TRANSTHORACIC ECHOCARDIOGRAM  09/03/2010   EF=>55%; mild MR; mod TR; AV mildly sclerotic with trace regurg; mild pulm valve regurg  . VESICOVAGINAL FISTULA CLOSURE W/ TAH      Family History  Problem Relation Age of Onset  . Stroke Mother   . Stroke Father        also heart disease  . Clotting disorder Paternal Grandmother        blood clot  . Stroke Brother        also heart disease    Social History   Tobacco Use  . Smoking status: Never Smoker  . Smokeless tobacco: Never Used  Vaping Use  . Vaping Use: Never used  Substance Use Topics  . Alcohol use: No  . Drug use: No    ROS   Objective:   Vitals: BP 129/78 (BP Location: Right Arm)   Pulse 73   Temp  98.9 F (37.2 C) (Oral)   Resp 16   SpO2 100%   Physical Exam Constitutional:      General: She is not in acute distress.    Appearance: Normal appearance. She is well-developed. She is not ill-appearing, toxic-appearing or diaphoretic.  HENT:     Head: Normocephalic and atraumatic.     Nose: Nose normal.     Mouth/Throat:     Mouth: Mucous membranes are moist.  Eyes:     Extraocular Movements: Extraocular movements intact.     Pupils: Pupils are equal, round, and reactive to light.  Cardiovascular:     Rate and Rhythm: Normal rate and regular rhythm.     Pulses: Normal pulses.     Heart sounds: Normal heart sounds. No murmur heard. No friction rub. No gallop.   Pulmonary:     Effort: Pulmonary effort is normal. No respiratory distress.     Breath sounds: Normal breath sounds. No stridor. No wheezing, rhonchi or rales.  Skin:    General: Skin is warm and dry.     Findings: No rash.  Neurological:     Mental Status: She is alert and oriented to person, place, and time.  Psychiatric:        Mood and Affect: Mood normal.        Behavior: Behavior normal.        Thought Content: Thought content normal.      ED ECG REPORT   Date: 05/05/2020  Rate: 71bpm  Rhythm: normal sinus rhythm  QRS Axis: left  Intervals: normal  ST/T Wave abnormalities: nonspecific T wave changes  Conduction Disutrbances:first-degree A-V block   Narrative Interpretation: Sinus rhythm at 71bpm with first degree AV block. Non-specific t-wave flattening in lead I, aVL. Unchanged from recent ecg on 05/01/2020.   Old EKG Reviewed: unchanged  I have personally reviewed the EKG tracing and agree with the computerized printout as noted.   DG Chest 2 View  Result Date: 03/04/2020 CLINICAL DATA:  Chest pain, palpitations EXAM: CHEST - 2 VIEW COMPARISON:  Radiograph 10/30/2019 FINDINGS: Some mildly coarsened interstitial bronchitic changes are similar to comparison exams. No consolidative opacity or  convincing features of edema. No pneumothorax or effusion. The cardiomediastinal contours are unremarkable. No acute osseous or soft tissue abnormality. Telemetry leads overlie the chest. IMPRESSION: Stable exam. No acute cardiopulmonary abnormality. Electronically Signed   By: Lovena Le M.D.   On: 03/04/2020 03:33   CT Head Wo Contrast  Result Date: 05/01/2020 CLINICAL DATA:  Headache. EXAM: CT HEAD WITHOUT CONTRAST TECHNIQUE: Contiguous axial images were obtained from the base of the skull through the vertex without intravenous contrast. COMPARISON:  None. FINDINGS: Brain: Mild chronic ischemic white matter disease is noted. No mass effect or midline shift is noted. Ventricular size is within normal limits. There is no evidence of mass lesion, hemorrhage or acute infarction. Vascular: No hyperdense vessel or unexpected calcification. Skull: Normal. Negative for fracture or focal lesion. Sinuses/Orbits: No acute finding. Other: None. IMPRESSION: Mild chronic ischemic white matter disease. No acute intracranial abnormality seen. Electronically Signed   By: Marijo Conception M.D.   On: 05/01/2020 15:28    Recent Results (from the past 2160 hour(s))  Hemoglobin A1c     Status: Abnormal   Collection Time: 02/07/20 10:16 AM  Result Value Ref Range   Hgb A1c MFr Bld 7.4 (H) 4.6 - 6.5 %    Comment: Glycemic Control Guidelines for People with Diabetes:Non Diabetic:  <6%Goal of Therapy: <7%Additional Action Suggested:  >8%   Basic metabolic panel     Status: Abnormal   Collection Time: 02/07/20 10:16 AM  Result Value Ref Range   Sodium 136 135 - 145 mEq/L   Potassium 4.1 3.5 - 5.1 mEq/L   Chloride 102 96 - 112 mEq/L   CO2 28 19 - 32 mEq/L   Glucose, Bld 152 (H) 70 - 99 mg/dL   BUN 13 6 - 23 mg/dL   Creatinine, Ser 1.11 0.40 - 1.20 mg/dL   GFR 46.02 (L) >60.00 mL/min    Comment: Calculated using the CKD-EPI  Creatinine Equation (2021)   Calcium 8.9 8.4 - 10.5 mg/dL  POCT INR     Status: None    Collection Time: 02/08/20 10:24 AM  Result Value Ref Range   INR 2.7 2.0 - 3.0  POCT Urinalysis Dipstick     Status: Abnormal   Collection Time: 02/28/20 11:04 AM  Result Value Ref Range   Color, UA light yellow    Clarity, UA clear    Glucose, UA Positive (A) Negative   Bilirubin, UA negative    Ketones, UA negative    Spec Grav, UA 1.010 1.010 - 1.025   Blood, UA Positive    pH, UA 6.5 5.0 - 8.0   Protein, UA Negative Negative   Urobilinogen, UA 0.2 0.2 or 1.0 E.U./dL   Nitrite, UA Negative    Leukocytes, UA Negative Negative   Appearance clear    Odor None   Basic metabolic panel     Status: Abnormal   Collection Time: 03/04/20  3:26 AM  Result Value Ref Range   Sodium 138 135 - 145 mmol/L   Potassium 3.6 3.5 - 5.1 mmol/L   Chloride 99 98 - 111 mmol/L   CO2 27 22 - 32 mmol/L   Glucose, Bld 173 (H) 70 - 99 mg/dL    Comment: Glucose reference range applies only to samples taken after fasting for at least 8 hours.   BUN 13 8 - 23 mg/dL   Creatinine, Ser 0.94 0.44 - 1.00 mg/dL   Calcium 9.2 8.9 - 10.3 mg/dL   GFR, Estimated >60 >60 mL/min    Comment: (NOTE) Calculated using the CKD-EPI Creatinine Equation (2021)    Anion gap 12 5 - 15    Comment: Performed at [facility], [place] [address] [place] [address], [place], [place] [address]  CBC     Status: None   Collection Time: 03/04/20  3:26 AM  Result Value Ref Range   WBC 5.6 4.0 - 10.5 K/uL   RBC 4.60 3.87 - 5.11 MIL/uL   Hemoglobin 13.5 12.0 - 15.0 g/dL   HCT 41.5 36.0 - 46.0 %   MCV 90.2 80.0 - 100.0 fL   MCH 29.3 26.0 - 34.0 pg   MCHC 32.5 30.0 - 36.0 g/dL   RDW 12.7 11.5 - 15.5 %   Platelets 225 150 - 400 K/uL   nRBC 0.0 0.0 - 0.2 %    Comment: Performed at [facility], [place] [address] [place], [place] [address]  Troponin I (High Sensitivity)     Status: None   Collection Time: 03/04/20  3:26 AM  Result Value Ref Range   Troponin I (High Sensitivity) 4 <18 ng/L    Comment:  (NOTE) Elevated high sensitivity troponin I (hsTnI) values and significant  changes across serial measurements may suggest ACS but many other  chronic and acute conditions are known to elevate hsTnI results.  Refer to the "Links" section for chest pain algorithms and additional  guidance. Performed at [facility], [place] [address], [place], [place] [address]   Magnesium     Status: None   Collection Time: 03/04/20  3:26 AM  Result Value Ref Range   Magnesium 1.9 1.7 - 2.4 mg/dL    Comment: Performed at [facility], [place] [address], [place], [place] [address]  TSH     Status: None   Collection Time: 03/04/20  3:26 AM  Result Value Ref Range   TSH 2.445 0.350 - 4.500 uIU/mL  Comment: Performed by a 3rd Generation assay with a functional sensitivity of <=0.01 uIU/mL. Performed at [facility], [place] [address], [place], [place] [address]   Protime-INR     Status: Abnormal   Collection Time: 03/04/20  3:27 AM  Result Value Ref Range   Prothrombin Time 22.4 (H) 11.4 - 15.2 seconds   INR 2.0 (H) 0.8 - 1.2    Comment: (NOTE) INR goal varies based on device and disease states. Performed at [facility], [place] [address] [place], [place] [address]   POCT INR     Status: None   Collection Time: 03/21/20 10:13 AM  Result Value Ref Range   INR 2.3 2.0 - 3.0  Hemoglobin A1c     Status: Abnormal   Collection Time: 03/28/20 11:43 AM  Result Value Ref Range   Hgb A1c MFr Bld 7.1 (H) 4.6 - 6.5 %    Comment: Glycemic Control Guidelines for People with Diabetes:Non Diabetic:  <6%Goal of Therapy: <7%Additional Action Suggested:  >8%   Comprehensive metabolic panel     Status: Abnormal   Collection Time: 03/28/20 11:43 AM  Result Value Ref Range   Sodium 138 135 - 145 mEq/L   Potassium 4.0 3.5 - 5.1 mEq/L   Chloride 102 96 - 112 mEq/L   CO2 30 19 - 32 mEq/L   Glucose, Bld 100 (H) 70 - 99 mg/dL   BUN 15 6 - 23 mg/dL    Creatinine, Ser 1.10 0.40 - 1.20 mg/dL   Total Bilirubin 0.5 0.2 - 1.2 mg/dL   Alkaline Phosphatase 56 39 - 117 U/L   AST 16 0 - 37 U/L   ALT 11 0 - 35 U/L   Total Protein 7.5 6.0 - 8.3 g/dL   Albumin 4.5 3.5 - 5.2 g/dL   GFR 46.48 (L) >60.00 mL/min    Comment: Calculated using the CKD-EPI Creatinine Equation (2021)   Calcium 9.4 8.4 - 10.5 mg/dL  POC SARS Coronavirus 2 Ag-ED - Nasal Swab (BD Veritor Kit)     Status: None   Collection Time: 04/17/20 10:36 PM  Result Value Ref Range   SARS Coronavirus 2 Ag NEGATIVE NEGATIVE    Comment: (NOTE) SARS-CoV-2 antigen NOT DETECTED.   Negative results are presumptive.  Negative results do not preclude SARS-CoV-2 infection and should not be used as the sole basis for treatment or other patient management decisions, including infection  control decisions, particularly in the presence of clinical signs and  symptoms consistent with COVID-19, or in those who have been in contact with the virus.  Negative results must be combined with clinical observations, patient history, and epidemiological information. The expected result is Negative.  Fact Sheet for Patients: PodPark.tn  Fact Sheet for Healthcare Providers: GiftContent.is   This test is not yet approved or cleared by the [place] FDA and  has been authorized for detection and/or diagnosis of SARS-CoV-2 by FDA under an Emergency Use Authorization (EUA).  This EUA will remain in effect (meaning this test can be used) for the duration of  the C OVID-19 declaration under Section 564(b)(1) of the Act, 21 U.S.C. section 360bbb-3(b)(1), unless the authorization is terminated or revoked sooner.    Hepatic function panel     Status: None   Collection Time: 05/01/20  1:00 PM  Result Value Ref Range   Total Protein 7.5 6.5 - 8.1 g/dL   Albumin 3.9 3.5 - 5.0 g/dL   AST 17 15 - 41 U/L  ALT 10 0 - 44 U/L   Alkaline  Phosphatase 53 38 - 126 U/L   Total Bilirubin 0.8 0.3 - 1.2 mg/dL   Bilirubin, Direct 0.1 0.0 - 0.2 mg/dL   Indirect Bilirubin 0.7 0.3 - 0.9 mg/dL    Comment: Performed at [facility], [place] [address] [place], [place] [address]  Lipase, blood     Status: None   Collection Time: 05/01/20  1:00 PM  Result Value Ref Range   Lipase 25 11 - 51 U/L    Comment: Performed at [facility], [place] [address] [place], [place] [address]  CBC with Differential     Status: Abnormal   Collection Time: 05/01/20  1:06 PM  Result Value Ref Range   WBC 4.9 4.0 - 10.5 K/uL   RBC 4.04 3.87 - 5.11 MIL/uL   Hemoglobin 11.7 (L) 12.0 - 15.0 g/dL   HCT 35.6 (L) 36.0 - 46.0 %   MCV 88.1 80.0 - 100.0 fL   MCH 29.0 26.0 - 34.0 pg   MCHC 32.9 30.0 - 36.0 g/dL   RDW 12.1 11.5 - 15.5 %   Platelets 255 150 - 400 K/uL   nRBC 0.0 0.0 - 0.2 %   Neutrophils Relative % 64 %   Neutro Abs 3.1 1.7 - 7.7 K/uL   Lymphocytes Relative 21 %   Lymphs Abs 1.0 0.7 - 4.0 K/uL   Monocytes Relative 12 %   Monocytes Absolute 0.6 0.1 - 1.0 K/uL   Eosinophils Relative 3 %   Eosinophils Absolute 0.2 0.0 - 0.5 K/uL   Basophils Relative 0 %   Basophils Absolute 0.0 0.0 - 0.1 K/uL   Immature Granulocytes 0 %   Abs Immature Granulocytes 0.01 0.00 - 0.07 K/uL    Comment: Performed at [facility], [place] [address], [place], [place] [address]  Basic metabolic panel     Status: Abnormal   Collection Time: 05/01/20  1:06 PM  Result Value Ref Range   Sodium 138 135 - 145 mmol/L   Potassium 3.8 3.5 - 5.1 mmol/L   Chloride 100 98 - 111 mmol/L   CO2 30 22 - 32 mmol/L   Glucose, Bld 140 (H) 70 - 99 mg/dL    Comment: Glucose reference range applies only to samples taken after fasting for at least 8 hours.   BUN 6 (L) 8 - 23 mg/dL   Creatinine, Ser 0.90 0.44 - 1.00 mg/dL   Calcium 9.0 8.9 - 10.3 mg/dL   GFR, Estimated >60 >60 mL/min    Comment: (NOTE) Calculated using the CKD-EPI  Creatinine Equation (2021)    Anion gap 8 5 - 15    Comment: Performed at [facility], [place] [address], [place], [place] [address]  Protime-INR     Status: Abnormal   Collection Time: 05/01/20  1:06 PM  Result Value Ref Range   Prothrombin Time 19.3 (H) 11.4 - 15.2 seconds   INR 1.7 (H) 0.8 - 1.2    Comment: (NOTE) INR goal varies based on device and disease states. Performed at [facility], [place] [address], [place], [place] [address]   Lactic acid, plasma     Status: None   Collection Time: 05/01/20  1:06 PM  Result Value Ref Range   Lactic Acid, Venous 1.2 0.5 - 1.9 mmol/L    Comment: Performed at [facility], [place] [address] [place], [place] [address]  Resp Panel by RT-PCR (Flu  A&B, Covid) Nasopharyngeal Swab     Status: Abnormal   Collection Time: 05/01/20  1:35 PM   Specimen: Nasopharyngeal Swab; Nasopharyngeal(NP) swabs in vial transport medium  Result Value Ref Range   SARS Coronavirus 2 by RT PCR POSITIVE (A) NEGATIVE    Comment: RESULT CALLED TO, READ BACK BY AND VERIFIED WITH: DOWD,P. RN $[email or web address]$  ON 01.18.2022 BY COHEN,K (NOTE) SARS-CoV-2 target nucleic acids are DETECTED.  The SARS-CoV-2 RNA is generally detectable in upper respiratory specimens during the acute phase of infection. Positive results are indicative of the presence of the identified virus, but do not rule out bacterial infection or co-infection with other pathogens not detected by the test. Clinical correlation with patient history and other diagnostic information is necessary to determine patient infection status. The expected result is Negative.  Fact Sheet for Patients: EntrepreneurPulse.com.au  Fact Sheet for Healthcare Providers: IncredibleEmployment.be  This test is not yet approved or cleared by the [place] FDA and  has been authorized for detection and/or diagnosis of SARS-CoV-2 by FDA under an  Emergency Use Authorization (EUA).  This EUA will remain in effect (meaning this tes t can be used) for the duration of  the COVID-19 declaration under Section 564(b)(1) of the Act, 21 U.S.C. section 360bbb-3(b)(1), unless the authorization is terminated or revoked sooner.     Influenza A by PCR NEGATIVE NEGATIVE   Influenza B by PCR NEGATIVE NEGATIVE    Comment: (NOTE) The Xpert Xpress SARS-CoV-2/FLU/RSV plus assay is intended as an aid in the diagnosis of influenza from Nasopharyngeal swab specimens and should not be used as a sole basis for treatment. Nasal washings and aspirates are unacceptable for Xpert Xpress SARS-CoV-2/FLU/RSV testing.  Fact Sheet for Patients: EntrepreneurPulse.com.au  Fact Sheet for Healthcare Providers: IncredibleEmployment.be  This test is not yet approved or cleared by the [place] FDA and has been authorized for detection and/or diagnosis of SARS-CoV-2 by FDA under an Emergency Use Authorization (EUA). This EUA will remain in effect (meaning this test can be used) for the duration of the COVID-19 declaration under Section 564(b)(1) of the Act, 21 U.S.C. section 360bbb-3(b)(1), unless the authorization is terminated or revoked.  Performed at [facility], [place] [address], [place], [place] [address]   Urinalysis, Routine w reflex microscopic     Status: Abnormal   Collection Time: 05/01/20  1:53 PM  Result Value Ref Range   Color, Urine STRAW (A) YELLOW   APPearance CLEAR CLEAR   Specific Gravity, Urine 1.002 (L) 1.005 - 1.030   pH 8.0 5.0 - 8.0   Glucose, UA NEGATIVE NEGATIVE mg/dL   Hgb urine dipstick MODERATE (A) NEGATIVE   Bilirubin Urine NEGATIVE NEGATIVE   Ketones, ur NEGATIVE NEGATIVE mg/dL   Protein, ur NEGATIVE NEGATIVE mg/dL   Nitrite NEGATIVE NEGATIVE   Leukocytes,Ua NEGATIVE NEGATIVE   RBC / HPF 0-5 0 - 5 RBC/hpf   Bacteria, UA NONE SEEN NONE SEEN    Comment: Performed  at [facility] [place] [address], [place], [place] [address]     Assessment and Plan :   PDMP not reviewed this encounter.  1. Palpitations   2. Racing heart beat   3. COVID-19   4. Shortness of breath     Emphasized need to continue taking her heart medications.  At this time, patient does not endorse any chest pain and her EKG, recent work-up is unremarkable apart from COVID 19.  Patient has excellent vital signs.  Recommended very close follow-up  with her PCP and cardiologist.  Maintain strict ER precautions.   Jaynee Eagles, [place] 05/05/20 [id number]

## 2020-05-05 NOTE — ED Triage Notes (Signed)
Pt present racing heart with SOB. Pt state symptoms started today. Pt state that the SOB comes and goes.

## 2020-05-05 NOTE — Discharge Instructions (Signed)
Please make sure you contact your heart doctor Monday morning. If you develop chest pain, difficulty with your breathing, bloody coughing, confusion then please report to the emergency room. Keep taking your heart medications and all your other regular medications.

## 2020-05-07 NOTE — Telephone Encounter (Signed)
If blood pressure is low and the patient is dizzy-hold blood pressure medication amlodipine. If blood sugar is low hold Prandin; restart Prandin if blood sugar starts to go up. Thanks

## 2020-05-08 ENCOUNTER — Telehealth: Payer: Self-pay | Admitting: Internal Medicine

## 2020-05-08 ENCOUNTER — Emergency Department (HOSPITAL_COMMUNITY): Payer: Medicare HMO

## 2020-05-08 ENCOUNTER — Emergency Department (HOSPITAL_COMMUNITY)
Admission: EM | Admit: 2020-05-08 | Discharge: 2020-05-08 | Disposition: A | Discharge status: Home / Self Care | Payer: Medicare HMO | Attending: Emergency Medicine | Admitting: Emergency Medicine

## 2020-05-08 ENCOUNTER — Other Ambulatory Visit: Payer: Self-pay

## 2020-05-08 DIAGNOSIS — R918 Other nonspecific abnormal finding of lung field: Secondary | ICD-10-CM | POA: Diagnosis not present

## 2020-05-08 DIAGNOSIS — E119 Type 2 diabetes mellitus without complications: Secondary | ICD-10-CM | POA: Diagnosis not present

## 2020-05-08 DIAGNOSIS — I10 Essential (primary) hypertension: Secondary | ICD-10-CM | POA: Diagnosis not present

## 2020-05-08 DIAGNOSIS — U071 COVID-19: Secondary | ICD-10-CM | POA: Diagnosis not present

## 2020-05-08 DIAGNOSIS — I480 Paroxysmal atrial fibrillation: Secondary | ICD-10-CM | POA: Diagnosis not present

## 2020-05-08 DIAGNOSIS — R002 Palpitations: Secondary | ICD-10-CM | POA: Insufficient documentation

## 2020-05-08 DIAGNOSIS — R5383 Other fatigue: Secondary | ICD-10-CM | POA: Diagnosis present

## 2020-05-08 DIAGNOSIS — Z7901 Long term (current) use of anticoagulants: Secondary | ICD-10-CM | POA: Diagnosis not present

## 2020-05-08 LAB — BASIC METABOLIC PANEL
Anion gap: 7 (ref 5–15)
BUN: 7 mg/dL — ABNORMAL LOW (ref 8–23)
CO2: 30 mmol/L (ref 22–32)
Calcium: 9.4 mg/dL (ref 8.9–10.3)
Chloride: 99 mmol/L (ref 98–111)
Creatinine, Ser: 0.83 mg/dL (ref 0.44–1.00)
GFR, Estimated: >60 mL/min (ref >60)
Glucose, Bld: 146 mg/dL — ABNORMAL HIGH (ref 70–99)
Potassium: 4 mmol/L (ref 3.5–5.1)
Sodium: 136 mmol/L (ref 135–145)

## 2020-05-08 LAB — HEPATIC FUNCTION PANEL
ALT: 20 U/L (ref 0–44)
AST: 33 U/L (ref 15–41)
Albumin: 4.2 g/dL (ref 3.5–5.0)
Alkaline Phosphatase: 54 U/L (ref 38–126)
Bilirubin, Direct: 0.3 mg/dL — ABNORMAL HIGH (ref 0.0–0.2)
Indirect Bilirubin: 0.3 mg/dL (ref 0.3–0.9)
Total Bilirubin: 0.6 mg/dL (ref 0.3–1.2)
Total Protein: 7.7 g/dL (ref 6.5–8.1)

## 2020-05-08 LAB — CBC
HCT: 37.3 % (ref 36.0–46.0)
Hemoglobin: 12.1 g/dL (ref 12.0–15.0)
MCH: 29.4 pg (ref 26.0–34.0)
MCHC: 32.4 g/dL (ref 30.0–36.0)
MCV: 90.8 fL (ref 80.0–100.0)
Platelets: 366 10*3/uL (ref 150–400)
RBC: 4.11 MIL/uL (ref 3.87–5.11)
RDW: 12.9 % (ref 11.5–15.5)
WBC: 5.4 10*3/uL (ref 4.0–10.5)
nRBC: 0 % (ref 0.0–0.2)

## 2020-05-08 LAB — TROPONIN I (HIGH SENSITIVITY): Troponin I (High Sensitivity): 3 ng/L (ref <18)

## 2020-05-08 LAB — PROTIME-INR
INR: 2.8 — ABNORMAL HIGH (ref 0.8–1.2)
Prothrombin Time: 28.8 seconds — ABNORMAL HIGH (ref 11.4–15.2)

## 2020-05-08 NOTE — Discharge Instructions (Signed)
You were seen in the emergency department today with heart palpitations.  This may be your A. fib going in and out of rhythm but your heart tracing and lab work here is normal.  Your chest x-ray looks like your diagnosis of COVID-19 but you are not requiring oxygen.  If you develop chest pain, shortness of breath, lightheadedness like you are going to pass out please return to the emergency department.  Otherwise, please continue to treat symptoms at home and follow with your primary care doctor in the coming week.

## 2020-05-08 NOTE — ED Notes (Signed)
EDP at beside  

## 2020-05-08 NOTE — Telephone Encounter (Signed)
Patient c/o Palpitations:  High priority if patient c/o lightheadedness, shortness of breath, or chest pain  1) How long have you had palpitations/irregular HR/ Afib? Are you having the symptoms now? Patient states she has been having palpitations off and on for 1-2 weeks, since testing positive for COVID. Per patient, she is not currently having any symptoms.   2) Are you currently experiencing lightheadedness, SOB or CP? No   3) Do you have a history of afib (atrial fibrillation) or irregular heart rhythm? Yes   4) Have you checked your BP or HR? (document readings if available):  No log available   5) Are you experiencing any other symptoms? Fatigue

## 2020-05-08 NOTE — ED Triage Notes (Signed)
Pt arrived via walk in, covid positive x1 week. States she has felt her heart is racing on and off for the last couple days. Denies any chest pain. Endorses generalized fatigue since dx with covid.

## 2020-05-08 NOTE — ED Provider Notes (Signed)
Emergency Department Provider Note   I have reviewed the triage vital signs and the nursing notes.   HISTORY  Chief Complaint Covid Positive and Palpitations   HPI [name] is a 86 y.o. female with PMH reviewed below including PAF on Warfarin presents to the ED with palpitations in the setting of COVID 19. Patient has had one week of symptoms and was diagnosed during an ED visit.  She denies any shortness of breath or chest pain.  She is not feeling presyncopal. No fever.  She has had some fatigue and has been sleeping more than normal but is able to perform ADLs.  She was reevaluated at urgent care on 1/22 and ultimately discharged home with strict return precautions.  She states that she has had intermittent heart palpitations which come on randomly and then go away after a brief period of time.  No active symptoms. She is eating and drinking well. No radiation of symptoms or other modifying factors.   Past Medical History:  Diagnosis Date  . Anxiety state, unspecified   . Bronchitis, not specified as acute or chronic   . Diaphragmatic hernia without mention of obstruction or gangrene   . Dyslipidemia   . Essential hypertension   . Irritable bowel syndrome   . Mitral valve disorders(424.0)    a. 08/2010 Echo: EF >55%, mild MR, mod TR, trace AI.  [name] Non-obstructive CAD    a. 07/2010 Lexiscan MV: no ischemia, EF 78%; b. 07/2010 Cath: LM nl, LAD50/60p, 40 apical, LCX nl, RCA dominant, 30/40p, 22m.  . Osteoarthrosis, unspecified whether generalized or localized, unspecified site   . Osteoporosis, unspecified   . Other abnormal glucose   . PAF (paroxysmal atrial fibrillation) (HCC)    a. CHA2DS2VASc = 4-->coumadin.  [name] Phlebitis and thrombophlebitis of superficial vessels of lower extremities   . PSVT (paroxysmal supraventricular tachycardia) ([place])    a. 05/2012 Holter: short bursts of PSVT noted-->Managed with toprol.  [name] Unspecified venous (peripheral) insufficiency   .  Unspecified vitamin D deficiency     Patient Active Problem List   Diagnosis Date Noted  . Gastroenteritis 04/27/2020  . Dehydration 04/27/2020  . Urinary frequency 02/28/2020  . Peroneal tendinitis 11/01/2019  . Spontaneous rupture of extensor tendon of left foot 10/28/2019  . Contusion of left foot 10/27/2019  . Sinusitis 04/13/2019  . Rash 12/05/2016  . Dyspnea 04/24/2016  . Tricuspid valve insufficiency 04/24/2016  . Cerumen impaction 11/19/2015  . Ear discomfort 11/13/2015  . Grief at loss of child 09/14/2015  . Well adult exam 09/14/2015  . Dyslipidemia   . PSVT (paroxysmal supraventricular tachycardia) ([place])   . PAF (paroxysmal atrial fibrillation) ([place])   . Viral gastroenteritis 07/21/2015  . Acute upper respiratory infection 04/17/2015  . Edema 11/08/2014  . Chronic venous insufficiency 11/08/2014  . Diarrhea 05/01/2014  . Mass of left thigh 12/14/2013  . Microhematuria 12/14/2013  . Hargis Vandyne term (current) use of anticoagulants 08/19/2013  . CAD (coronary artery disease) 05/21/2011  . Atrial tachycardia ([place]) 11/19/2010  . CHEST PAIN 08/07/2009  . Vitamin D deficiency 04/19/2008  . SUPERFICIAL THROMBOPHLEBITIS 10/20/2007  . DM2 (diabetes mellitus, type 2) ([place]) 03/16/2007  . HYPERCHOLESTEROLEMIA 03/15/2007  . Anxiety 03/15/2007  . Essential hypertension 03/15/2007  . Mitral valve disorder 03/15/2007  . BRONCHITIS 03/15/2007  . HIATAL HERNIA 03/15/2007  . Irritable bowel syndrome 03/15/2007  . Osteoarthritis 03/15/2007  . Osteoporosis 03/15/2007    Past Surgical History:  Procedure Laterality Date  . CARDIAC CATHETERIZATION  07/30/2010   nonobstructive CAD, 20-40% RCA stenosis, 50-60% eccentric LAD stenosis more prox, 40% LAD stenosis more distal, EF 65%  . TRANSTHORACIC ECHOCARDIOGRAM  09/03/2010   EF=>55%; mild MR; mod TR; AV mildly sclerotic with trace regurg; mild pulm valve regurg  . VESICOVAGINAL FISTULA CLOSURE W/ TAH      Allergies Clarithromycin,  Fosamax [alendronate sodium], Guaifenesin er, Metformin, Prandin [repaglinide], and Tape  Family History  Problem Relation Age of Onset  . Stroke Mother   . Stroke Father        also heart disease  . Clotting disorder Paternal Grandmother        blood clot  . Stroke Brother        also heart disease    Social History Social History   Tobacco Use  . Smoking status: Never Smoker  . Smokeless tobacco: Never Used  Vaping Use  . Vaping Use: Never used  Substance Use Topics  . Alcohol use: No  . Drug use: No    Review of Systems  Constitutional: No fever/chills. Positive fatigue.  Eyes: No visual changes. ENT: No sore throat. Cardiovascular: Denies chest pain. Positive palpitations.  Respiratory: Denies shortness of breath. Gastrointestinal: No abdominal pain.  No nausea, no vomiting.  No diarrhea.  No constipation. Genitourinary: Negative for dysuria. Musculoskeletal: Negative for back pain. Skin: Negative for rash. Neurological: Negative for headaches, focal weakness or numbness.  10-point ROS otherwise negative.  ____________________________________________   PHYSICAL EXAM:  VITAL SIGNS: ED Triage Vitals [05/08/20 1434]  Enc Vitals Group     BP (!) 156/78     Pulse Rate 73     Resp 18     Temp 97.8 F (36.6 C)     Temp Source Oral     SpO2 99 %   Constitutional: Alert and oriented. Well appearing and in no acute distress. Eyes: Conjunctivae are normal.  Head: Atraumatic. Nose: No congestion/rhinnorhea. Mouth/Throat: Mucous membranes are moist.   Neck: No stridor.   Cardiovascular: Normal rate, regular rhythm. Good peripheral circulation. Grossly normal heart sounds.   Respiratory: Normal respiratory effort.  No retractions. Lungs CTAB. Gastrointestinal: Soft and nontender. No distention.  Musculoskeletal: No gross deformities of extremities. Neurologic:  Normal speech and language.  Skin:  Skin is warm, dry and intact. No rash  noted.   ____________________________________________   LABS (all labs ordered are listed, but only abnormal results are displayed)  Labs Reviewed  BASIC METABOLIC PANEL - Abnormal; Notable for the following components:      Result Value   Glucose, Bld 146 (*)    BUN 7 (*)    All other components within normal limits  HEPATIC FUNCTION PANEL - Abnormal; Notable for the following components:   Bilirubin, Direct 0.3 (*)    All other components within normal limits  PROTIME-INR - Abnormal; Notable for the following components:   Prothrombin Time 28.8 (*)    INR 2.8 (*)    All other components within normal limits  CBC  TROPONIN I (HIGH SENSITIVITY)  TROPONIN I (HIGH SENSITIVITY)   ____________________________________________  EKG   EKG Interpretation  Date/Time:  Tuesday May 08 2020 14:39:11 EST Ventricular Rate:  74 PR Interval:    QRS Duration: 102 QT Interval:  377 QTC Calculation: 419 R Axis:   -16 Text Interpretation: Sinus rhythm Abnormal R-wave progression, early transition LVH with secondary repolarization abnormality Anterior Q waves, possibly due to LVH 12 Lead; [place]ar No significant change since last tracing Confirmed by Dorie Rank (  [id number]) on 05/08/2020 9:01:19 PM       ____________________________________________  RADIOLOGY  DG Chest 2 View  Result Date: 05/08/2020 CLINICAL DATA:  COVID positive x1 week, heart racing on off for the last 2 days, denies chest pain, endorses generalized fatigue EXAM: CHEST - 2 VIEW COMPARISON:  Chest radiograph March 04, 2020 FINDINGS: The heart size and mediastinal contours are unchanged. Mild diffuse interstitial opacities superimposed on mild chronic coarsened interstitial changes. No new consolidation. No pleural effusion or pneumothorax. The visualized skeletal structures are unchanged. IMPRESSION: Mild diffuse interstitial opacities superimposed on mild chronic interstitial changes, could reflect atypical  infection. No new consolidation. Electronically Signed   By: Dahlia Bailiff MD   On: 05/08/2020 15:29    ____________________________________________   PROCEDURES  Procedure(s) performed:   Procedures  None  ____________________________________________   INITIAL IMPRESSION / ASSESSMENT AND PLAN / ED COURSE  Pertinent labs & imaging results that were available during my care of the patient were reviewed by me and considered in my medical decision making (see chart for details).   Patient presents to the emergency department for evaluation of heart palpitations in the setting of Covid infection.  She is not hypoxemic.  No tachycardia here.  EKG reviewed and similar to prior tracings.  No syncope, shortness of breath, or other findings to suspect PE as a complication of Covid.  Plan to repeat labs including INR.  Chest x-ray shows atypical type process/infection consistent with COVID-19.  No more focal infiltrate to suspect developing CAP.   Patient's labs reviewed.  INR now 2.8 up from 1.7.  Troponin negative.  Patient on telemetry here with no acute arrhythmia. Plan for PCP follow. Discussed ED return precautions.  ____________________________________________  FINAL CLINICAL IMPRESSION(S) / ED DIAGNOSES  Final diagnoses:  Palpitations  COVID-19    Note:  This document was prepared using Dragon voice recognition software and may include unintentional dictation errors.  Nanda Quinton, MD, [facility] Emergency Medicine    Virgen Belland, W[facility], MD 05/08/20 [phone]

## 2020-05-08 NOTE — Telephone Encounter (Signed)
COVID-19 can cause palpitations. These  vitamins do not cause palpitations Hope she is ok. Thanks

## 2020-05-08 NOTE — ED Notes (Signed)
Pt ambulated in room with RN while monitoring oxygen saturation.  Saturation maintained at or above 97% throughout.

## 2020-05-08 NOTE — Telephone Encounter (Signed)
Thanks Malachy Mood - is that enough quarantine time? I thought it was 10 days after COVID 19 diagnosis.  Will check with Isaac Laud to see if he is okay to see her on 1/27.  Dr Lemmie Evens

## 2020-05-08 NOTE — Telephone Encounter (Signed)
Spoke to patient she stated she has been to ED twice this month with Afib.Stated she tested positive for covid 05/01/20.Stated last week she missed taking her medications due to being so weak and sleeping a lot.Stated she woke up this morning with fast heart beat she took 25 mg of metoprolol at 4:00 am.She took a Xanax around 5:00 am.Stated her heart seems alittle better at present.Patient requesting to be seen today.Advised no appointments available today.Called [facility] clinic no appointments available this week.Appointment scheduled with Almyra Deforest PA Thurs 1/27 at 3:15 pm.Advised to take metoprolol succinate 50 mg as prescribed twice a day.Try not to miss any doses.Advised to rest and stay hydrated.Advised to go to ED if heart starts beating fast again.

## 2020-05-08 NOTE — Telephone Encounter (Signed)
Patients daughter called and said that the patient is going to the ER today for heart palpitations. She was wondering if the below vitamins could cause this and or if Covid 19 could be causing the heart palpitations. She was wondering if the patient could stop the vitamins. Please call her back at [phone].

## 2020-05-10 ENCOUNTER — Ambulatory Visit (INDEPENDENT_AMBULATORY_CARE_PROVIDER_SITE_OTHER): Payer: Medicare HMO | Admitting: Pharmacist Clinician (PhC)/ Clinical Pharmacy Specialist

## 2020-05-10 ENCOUNTER — Ambulatory Visit (INDEPENDENT_AMBULATORY_CARE_PROVIDER_SITE_OTHER): Payer: Medicare HMO | Admitting: Physician Assistant

## 2020-05-10 ENCOUNTER — Other Ambulatory Visit: Payer: Self-pay

## 2020-05-10 VITALS — BP 134/76 | HR 72 | Wt 183.2 lb

## 2020-05-10 DIAGNOSIS — I2510 Atherosclerotic heart disease of native coronary artery without angina pectoris: Secondary | ICD-10-CM

## 2020-05-10 DIAGNOSIS — Z7901 Long term (current) use of anticoagulants: Secondary | ICD-10-CM | POA: Diagnosis not present

## 2020-05-10 DIAGNOSIS — E119 Type 2 diabetes mellitus without complications: Secondary | ICD-10-CM

## 2020-05-10 DIAGNOSIS — I10 Essential (primary) hypertension: Secondary | ICD-10-CM | POA: Diagnosis not present

## 2020-05-10 DIAGNOSIS — I480 Paroxysmal atrial fibrillation: Secondary | ICD-10-CM

## 2020-05-10 DIAGNOSIS — E785 Hyperlipidemia, unspecified: Secondary | ICD-10-CM | POA: Diagnosis not present

## 2020-05-10 DIAGNOSIS — Z8616 Personal history of COVID-19: Secondary | ICD-10-CM

## 2020-05-10 LAB — POCT INR: INR: 3.6 — AB (ref 2.0–3.0)

## 2020-05-10 NOTE — Patient Instructions (Signed)
Medication Instructions:  MAY TAKE COLACE OR MIRALAX FOR CONSTIPATION  CONTINUE METOPROLOL DAILY, MAY TAKE EXTRA 25MG  FOR INCREASED PALPITATIONS. ONLY ONCE A DAY *If you need a refill on your cardiac medications before your next appointment, please call your pharmacy*  Lab Work:   Testing/Procedures:  NONE    NONE  Follow-Up: Your next appointment:  1 month(s) In Person with K. Mali Hilty, MD OR IF UNAVAILABLE HAO MENG, PA-C  At [facility], you and your health needs are our priority.  As part of our continuing mission to provide you with exceptional heart care, we have created designated Provider Care Teams.  These Care Teams include your primary Cardiologist (physician) and Advanced Practice Providers (APPs -  Physician Assistants and Nurse Practitioners) who all work together to provide you with the care you need, when you need it.  We recommend signing up for the patient portal called "MyChart".  Sign up information is provided on this After Visit Summary.  MyChart is used to connect with patients for Virtual Visits (Telemedicine).  Patients are able to view lab/test results, encounter notes, upcoming appointments, etc.  Non-urgent messages can be sent to your provider as well.   To learn more about what you can do with MyChart, go to [email or web address].

## 2020-05-10 NOTE — Progress Notes (Signed)
Cardiology Office Note:    Date:  05/13/2020   ID:  KRISY BALKEMA, DOB 11/25/1934, MRN [id number]  PCP:  Cassandria Anger, MD  [facility] Cardiologist:  Pixie Casino, MD  [facility] Electrophysiologist:  None   Referring MD: Cassandria Anger, MD   No chief complaint on file.   History of Present Illness:    [name] is a 86 y.o. female with a hx of PAF, SVT, hypertension, hyperlipidemia, DM 2, venous insufficiency and history of nonobstructive CAD.  She underwent Myoview for chest pain in April 2012, this revealed EF 78%, no ischemia.  Due to persistent symptoms, she eventually underwent cardiac catheterization in April 2012 that showed normal left main, 50 to 60% LAD disease, 40% apical lesion, normal left circumflex, 30 to 40% proximal RCA lesion.  Echocardiogram at the time showed EF greater than 55%, mild aortic insufficiency and an aneurysmal atrial septum.  Patient was last seen by Dr. Debara Pickett in September 2021 at which time she was doing well.  More recently, she has been tested positive for Covid 19 on 05/01/2020.  Since discharge, he has went back to the emergency room twice on 1/22 and 1/25 due to increased palpitation, EKG obtained all showed sinus rhythm.  Patient presents today for follow-up due to palpitation.  She is still very weak and does not do much activity.  I am fine with her taking extra half a tablet of metoprolol succinate as needed to help with increased palpitation.  She does admit to have chills, fatigue however no significant cough or fever.  She does have constipation, she may use Colace and MiraLAX as needed.  Otherwise I will bring her back for reassessment in 1 month.  If she is still having increased palpitation at that time, I will increase her beta-blocker and consider a repeat heart monitor to assess the A. fib burden.  Previous heart monitor was obtained in 2020 showed low A. fib burden.   Past Medical History:  Diagnosis Date  .  Anxiety state, unspecified   . Bronchitis, not specified as acute or chronic   . Diaphragmatic hernia without mention of obstruction or gangrene   . Dyslipidemia   . Essential hypertension   . Irritable bowel syndrome   . Mitral valve disorders(424.0)    a. 08/2010 Echo: EF >55%, mild MR, mod TR, trace AI.  [name] Non-obstructive CAD    a. 07/2010 Lexiscan MV: no ischemia, EF 78%; b. 07/2010 Cath: LM nl, LAD50/60p, 40 apical, LCX nl, RCA dominant, 30/40p, 14m.  . Osteoarthrosis, unspecified whether generalized or localized, unspecified site   . Osteoporosis, unspecified   . Other abnormal glucose   . PAF (paroxysmal atrial fibrillation) (HCC)    a. CHA2DS2VASc = 4-->coumadin.  [name] Phlebitis and thrombophlebitis of superficial vessels of lower extremities   . PSVT (paroxysmal supraventricular tachycardia) ([place])    a. 05/2012 Holter: short bursts of PSVT noted-->Managed with toprol.  [name] Unspecified venous (peripheral) insufficiency   . Unspecified vitamin D deficiency     Past Surgical History:  Procedure Laterality Date  . CARDIAC CATHETERIZATION  07/30/2010   nonobstructive CAD, 20-40% RCA stenosis, 50-60% eccentric LAD stenosis more prox, 40% LAD stenosis more distal, EF 65%  . TRANSTHORACIC ECHOCARDIOGRAM  09/03/2010   EF=>55%; mild MR; mod TR; AV mildly sclerotic with trace regurg; mild pulm valve regurg  . VESICOVAGINAL FISTULA CLOSURE W/ TAH      Current Medications: Current Meds  Medication Sig  .  ACCU-CHEK FASTCLIX LANCETS MISC Use to check blood sugars twice a day Dx E11.9  . Alcohol Swabs (B-D SINGLE USE SWABS REGULAR) PADS Use to clean injection site to check blood sugars daily  . ALPRAZolam (XANAX) 0.5 MG tablet TAKE 1/2 TO 1 TABLET BY MOUTH THREE TIMES DAILY AS NEEDED FOR ANXIETY. DO NOT TAKE WITH TRAMADOL.THIS IS A 30 DAY SUPPLY  . amLODipine (NORVASC) 5 MG tablet Take 1 tablet (5 mg total) by mouth daily.  [name] aspirin 81 MG tablet Take 81 mg by mouth daily.  . Cholecalciferol  (VITAMIN D3) 50 MCG (2000 UT) capsule Take 1 capsule (2,000 Units total) by mouth daily.  . clidinium-chlordiazePOXIDE (LIBRAX) 5-2.5 MG capsule TAKE ONE CAPSULE BY MOUTH THREE TIMES DAILY AS NEEDED FOR CRAMPS  . diazepam (VALIUM) 5 MG tablet Take by mouth.  . diclofenac Sodium (VOLTAREN) 1 % GEL Apply 2 g topically 4 (four) times daily.  . diphenoxylate-atropine (LOMOTIL) 2.5-0.025 MG tablet Take 1 tablet by mouth 4 (four) times daily as needed for diarrhea or loose stools.  . furosemide (LASIX) 20 MG tablet TAKE 1 TABLET(20 MG) BY MOUTH DAILY AS NEEDED FOR SWELLING  . glucose blood (ACCU-CHEK AVIVA PLUS) test strip Use to check blood sugars twice a day  . hydroxypropyl methylcellulose (ISOPTO TEARS) 2.5 % ophthalmic solution Place 1 drop into both eyes 2 (two) times daily.  . hydrOXYzine (ATARAX/VISTARIL) 25 MG tablet Take 1 tablet (25 mg total) by mouth every 8 (eight) hours as needed for itching.  . isosorbide mononitrate (IMDUR) 30 MG 24 hr tablet TAKE 1 TABLET BY MOUTH EVERY DAY  . Lancets (ACCU-CHEK SOFT TOUCH) lancets Use as instructed  . metoprolol succinate (TOPROL-XL) 50 MG 24 hr tablet TAKE 1 TABLET(50 MG) BY MOUTH TWICE DAILY  . ondansetron (ZOFRAN ODT) 4 MG disintegrating tablet Take 1 tablet (4 mg total) by mouth every 8 (eight) hours as needed for up to 10 doses for nausea or vomiting.  . ondansetron (ZOFRAN) 4 MG tablet Take 1 tablet (4 mg total) by mouth every 8 (eight) hours as needed for nausea or vomiting.  . Oral Electrolytes (PEDIALYTE) PACK As directed for dehydration  . pantoprazole (PROTONIX) 40 MG tablet TAKE 1 TABLET BY MOUTH EVERY DAY  . simvastatin (ZOCOR) 20 MG tablet TAKE 1 TABLET BY MOUTH AT BEDTIME  . traMADol (ULTRAM) 50 MG tablet Take 1 tablet (50 mg total) by mouth 3 (three) times daily.  [name] warfarin (COUMADIN) 5 MG tablet TAKE 1 TABLET BY MOUTH EVERY DAY AS DIRECTED BY COUMADIN CLINIC     Allergies:   Clarithromycin, Fosamax [alendronate sodium], Guaifenesin  er, Metformin, Prandin [repaglinide], and Tape   Social History   Socioeconomic History  . Marital status: Widowed    Spouse name: Not on file  . Number of children: 4  . Years of education: Not on file  . Highest education level: Not on file  Occupational History  . Occupation: retired    Fish farm manager: RETIRED  Tobacco Use  . Smoking status: Never Smoker  . Smokeless tobacco: Never Used  Vaping Use  . Vaping Use: Never used  Substance and Sexual Activity  . Alcohol use: No  . Drug use: No  . Sexual activity: Not Currently  Other Topics Concern  . Not on file  Social History Narrative  . Not on file   Social Determinants of Health   Financial Resource Strain: High Risk  . Difficulty of Paying Living Expenses: Very hard  Food Insecurity: Food  Insecurity Present  . Worried About Charity fundraiser in the Last Year: Often true  . Ran Out of Food in the Last Year: Often true  Transportation Needs: No Transportation Needs  . Lack of Transportation (Medical): No  . Lack of Transportation (Non-Medical): No  Physical Activity: Insufficiently Active  . Days of Exercise per Week: 3 days  . Minutes of Exercise per Session: 30 min  Stress: No Stress Concern Present  . Feeling of Stress : Not at all  Social Connections: Not on file     Family History: The patient's family history includes Clotting disorder in her paternal grandmother; Stroke in her brother, father, and mother.  ROS:   Please see the history of present illness.     All other systems reviewed and are negative.  EKGs/Labs/Other Studies Reviewed:    The following studies were reviewed today:  Echo 05/19/2016 LV EF: 60% -  65%   -------------------------------------------------------------------  Indications:   (I48.91).   -------------------------------------------------------------------  History:  PMH: Acquired from the patient and from the patient&'s  chart. Dyspnea. Atrial fibrillation. Risk  factors:  Hypertension. Dyslipidemia.   -------------------------------------------------------------------  Study Conclusions   - Left ventricle: The cavity size was normal. There was mild focal  basal hypertrophy of the septum. Systolic function was normal.  The estimated ejection fraction was in the range of 60% to 65%.  Wall motion was normal; there were no regional wall motion  abnormalities. Doppler parameters are consistent with abnormal  left ventricular relaxation (grade 1 diastolic dysfunction).  - Pulmonary arteries: Systolic pressure was mildly increased. PA  peak pressure: 34 mm Hg (S).   EKG:  EKG is ordered today.  The ekg ordered today demonstrates sinus rhythm with PACs.  Recent Labs: 10/30/2019: B Natriuretic Peptide 42.9 03/04/2020: Magnesium 1.9; TSH 2.445 05/08/2020: ALT 20 05/12/2020: BUN 12; Creatinine, Ser 0.96; Hemoglobin 12.0; Platelets 275; Potassium 4.0; Sodium 133  Recent Lipid Panel    Component Value Date/Time   CHOL 176 11/18/2018 1024   CHOL 160 06/09/2018 1046   TRIG 75.0 11/18/2018 1024   HDL 72.20 11/18/2018 1024   HDL 72 06/09/2018 1046   CHOLHDL 2 11/18/2018 1024   VLDL 15.0 11/18/2018 1024   LDLCALC 89 11/18/2018 1024   LDLCALC 74 06/09/2018 1046   LDLDIRECT 105.5 11/26/2010 0905     Risk Assessment/Calculations:    CHA2DS2-VASc Score = 6  This indicates a 9.7% annual risk of stroke. The patient's score is based upon: CHF History: No HTN History: Yes Diabetes History: Yes Stroke History: No Vascular Disease History: Yes Age Score: 2 Gender Score: 1      Physical Exam:    VS:  BP 134/76 (BP Location: Left Arm, Patient Position: Sitting, Cuff Size: Normal)   Pulse 72   Wt 183 lb 3.2 oz (83.1 kg)   BMI 29.57 kg/m     Wt Readings from Last 3 Encounters:  05/10/20 183 lb 3.2 oz (83.1 kg)  05/01/20 186 lb (84.4 kg)  03/28/20 189 lb 6.4 oz (85.9 kg)     GEN:  Well nourished, well developed in no acute  distress HEENT: Normal NECK: No JVD; No carotid bruits LYMPHATICS: No lymphadenopathy CARDIAC: RRR, no murmurs, rubs, gallops RESPIRATORY:  Clear to auscultation without rales, wheezing or rhonchi  ABDOMEN: Soft, non-tender, non-distended MUSCULOSKELETAL:  No edema; No deformity  SKIN: Warm and dry NEUROLOGIC:  Alert and oriented x 3 PSYCHIATRIC:  Normal affect   ASSESSMENT:    1. PAF (  paroxysmal atrial fibrillation) ([place])   2. Essential hypertension   3. Hyperlipidemia LDL goal <70   4. Controlled type 2 diabetes mellitus without complication, without long-term current use of insulin ([place])   5. Coronary artery disease involving native coronary artery of native heart without angina pectoris   6. History of COVID-19    PLAN:    In order of problems listed above:  1. PAF: She has been having increased palpitation recently.  I recommend continuing metoprolol succinate 50 mg twice daily with additional 25 mg as needed to help with suppression of palpitation.  Once she fully recovered from COVID-19, if she continues to have significant palpitation, then I will consider another heart monitor.  Last heart monitor was performed in 2020.  Continue on Coumadin  2. Hypertension: Continue on current therapy  3. Hyperlipidemia: On Zocor  4. DM2: Managed by primary care provider  5. Nonobstructive CAD: Denies any recent chest pain  6. Recent COVID-19 infection: Continue to be very fatigued and deconditioned.  Will reassess her palpitation in 1 month after she fully recovered from COVID-19.        Medication Adjustments/Labs and Tests Ordered: Current medicines are reviewed at length with the patient today.  Concerns regarding medicines are outlined above.  Orders Placed This Encounter  Procedures  . EKG 12-Lead   No orders of the defined types were placed in this encounter.   Patient Instructions  Medication Instructions:  MAY TAKE COLACE OR MIRALAX FOR CONSTIPATION  CONTINUE  METOPROLOL DAILY, MAY TAKE EXTRA 25MG  FOR INCREASED PALPITATIONS. ONLY ONCE A DAY *If you need a refill on your cardiac medications before your next appointment, please call your pharmacy*  Lab Work:   Testing/Procedures:  NONE    NONE  Follow-Up: Your next appointment:  1 month(s) In Person with K. Mali Hilty, MD OR IF UNAVAILABLE Aristeo Hankerson, PA-C  At [facility], you and your health needs are our priority.  As part of our continuing mission to provide you with exceptional heart care, we have created designated Provider Care Teams.  These Care Teams include your primary Cardiologist (physician) and Advanced Practice Providers (APPs -  Physician Assistants and Nurse Practitioners) who all work together to provide you with the care you need, when you need it.  We recommend signing up for the patient portal called "MyChart".  Sign up information is provided on this After Visit Summary.  MyChart is used to connect with patients for Virtual Visits (Telemedicine).  Patients are able to view lab/test results, encounter notes, upcoming appointments, etc.  Non-urgent messages can be sent to your provider as well.   To learn more about what you can do with MyChart, go to [email or web address].       Hilbert Corrigan, [place]  05/13/2020 11:48 AM    [place]

## 2020-05-10 NOTE — Telephone Encounter (Signed)
Seen in clinic today, two recent visit to ED due to palpitation, but EKG showed sinus rhythm. She still appears to be deconditioned and weak from [place]. I recommended continue 50mg  BID dosing of Toprol XL with 1 extra 25mg  as needed for increased palpitation. Plan to reassess in 1 month, if still having frequent palpitation despite recovery from COVID, then will consider heart monitor to assess afib burden.

## 2020-05-10 NOTE — Telephone Encounter (Signed)
Pt was seen at ED for palpitations.Kelly KitchenJohny Wiggins

## 2020-05-11 NOTE — Telephone Encounter (Signed)
Thanks Isaac Laud - agree with that plan  -Mali

## 2020-05-12 ENCOUNTER — Emergency Department (HOSPITAL_COMMUNITY)
Admission: EM | Admit: 2020-05-12 | Discharge: 2020-05-12 | Disposition: A | Discharge status: Home / Self Care | Payer: Medicare HMO | Attending: Emergency Medicine | Admitting: Emergency Medicine

## 2020-05-12 ENCOUNTER — Other Ambulatory Visit: Payer: Self-pay

## 2020-05-12 ENCOUNTER — Encounter (HOSPITAL_COMMUNITY): Payer: Self-pay | Admitting: Emergency Medicine

## 2020-05-12 ENCOUNTER — Emergency Department (HOSPITAL_COMMUNITY): Payer: Medicare HMO

## 2020-05-12 DIAGNOSIS — R079 Chest pain, unspecified: Secondary | ICD-10-CM | POA: Diagnosis not present

## 2020-05-12 DIAGNOSIS — Z7982 Long term (current) use of aspirin: Secondary | ICD-10-CM | POA: Diagnosis not present

## 2020-05-12 DIAGNOSIS — U071 COVID-19: Secondary | ICD-10-CM | POA: Diagnosis not present

## 2020-05-12 DIAGNOSIS — Z79899 Other long term (current) drug therapy: Secondary | ICD-10-CM | POA: Diagnosis not present

## 2020-05-12 DIAGNOSIS — Z8616 Personal history of COVID-19: Secondary | ICD-10-CM | POA: Diagnosis not present

## 2020-05-12 DIAGNOSIS — R002 Palpitations: Secondary | ICD-10-CM | POA: Insufficient documentation

## 2020-05-12 DIAGNOSIS — I2510 Atherosclerotic heart disease of native coronary artery without angina pectoris: Secondary | ICD-10-CM | POA: Insufficient documentation

## 2020-05-12 DIAGNOSIS — Z7901 Long term (current) use of anticoagulants: Secondary | ICD-10-CM | POA: Diagnosis not present

## 2020-05-12 DIAGNOSIS — I10 Essential (primary) hypertension: Secondary | ICD-10-CM | POA: Diagnosis not present

## 2020-05-12 DIAGNOSIS — R0602 Shortness of breath: Secondary | ICD-10-CM | POA: Diagnosis not present

## 2020-05-12 DIAGNOSIS — R0789 Other chest pain: Secondary | ICD-10-CM | POA: Diagnosis not present

## 2020-05-12 LAB — BASIC METABOLIC PANEL
Anion gap: 9 (ref 5–15)
BUN: 12 mg/dL (ref 8–23)
CO2: 27 mmol/L (ref 22–32)
Calcium: 9.6 mg/dL (ref 8.9–10.3)
Chloride: 97 mmol/L — ABNORMAL LOW (ref 98–111)
Creatinine, Ser: 0.96 mg/dL (ref 0.44–1.00)
GFR, Estimated: 59 mL/min — ABNORMAL LOW (ref >60)
Glucose, Bld: 146 mg/dL — ABNORMAL HIGH (ref 70–99)
Potassium: 4 mmol/L (ref 3.5–5.1)
Sodium: 133 mmol/L — ABNORMAL LOW (ref 135–145)

## 2020-05-12 LAB — TROPONIN I (HIGH SENSITIVITY)
Troponin I (High Sensitivity): 7 ng/L (ref <18)
Troponin I (High Sensitivity): 4 ng/L (ref <18)

## 2020-05-12 LAB — PROTIME-INR
INR: 2.1 — ABNORMAL HIGH (ref 0.8–1.2)
Prothrombin Time: 23.1 seconds — ABNORMAL HIGH (ref 11.4–15.2)

## 2020-05-12 LAB — CBC
HCT: 36.5 % (ref 36.0–46.0)
Hemoglobin: 12 g/dL (ref 12.0–15.0)
MCH: 29.6 pg (ref 26.0–34.0)
MCHC: 32.9 g/dL (ref 30.0–36.0)
MCV: 90.1 fL (ref 80.0–100.0)
Platelets: 275 10*3/uL (ref 150–400)
RBC: 4.05 MIL/uL (ref 3.87–5.11)
RDW: 13 % (ref 11.5–15.5)
WBC: 6.2 10*3/uL (ref 4.0–10.5)
nRBC: 0 % (ref 0.0–0.2)

## 2020-05-12 NOTE — ED Provider Notes (Signed)
[facility] DEPT Provider Note   CSN: [id number] Arrival date & time: 05/12/20  1523     History Chief Complaint  Patient presents with  . Chest Pain  . Covid Positive    [name] is a 86 y.o. female.  Patient presents to ER chief complaint of chest tightness and palpitations.  Palpitations have been ongoing off and on for several months as she has a history of paroxysmal atrial fibrillation.  However she recently contracted Covid approximately 10 days ago and feels like her palpitations or shortness of breath have worsened over the course of the past week.  Denies any cough or fevers.  No shortness of breath.  No chest pain at this time, however she had an episode of chest discomfort prior to arrival at home.        Past Medical History:  Diagnosis Date  . Anxiety state, unspecified   . Bronchitis, not specified as acute or chronic   . Diaphragmatic hernia without mention of obstruction or gangrene   . Dyslipidemia   . Essential hypertension   . Irritable bowel syndrome   . Mitral valve disorders(424.0)    a. 08/2010 Echo: EF >55%, mild MR, mod TR, trace AI.  [name] Non-obstructive CAD    a. 07/2010 Lexiscan MV: no ischemia, EF 78%; b. 07/2010 Cath: LM nl, LAD50/60p, 40 apical, LCX nl, RCA dominant, 30/40p, 38m.  . Osteoarthrosis, unspecified whether generalized or localized, unspecified site   . Osteoporosis, unspecified   . Other abnormal glucose   . PAF (paroxysmal atrial fibrillation) (HCC)    a. CHA2DS2VASc = 4-->coumadin.  [name] Phlebitis and thrombophlebitis of superficial vessels of lower extremities   . PSVT (paroxysmal supraventricular tachycardia) ([place])    a. 05/2012 Holter: short bursts of PSVT noted-->Managed with toprol.  [name] Unspecified venous (peripheral) insufficiency   . Unspecified vitamin D deficiency     Patient Active Problem List   Diagnosis Date Noted  . Gastroenteritis 04/27/2020  . Dehydration 04/27/2020  . Urinary  frequency 02/28/2020  . Peroneal tendinitis 11/01/2019  . Spontaneous rupture of extensor tendon of left foot 10/28/2019  . Contusion of left foot 10/27/2019  . Sinusitis 04/13/2019  . Rash 12/05/2016  . Dyspnea 04/24/2016  . Tricuspid valve insufficiency 04/24/2016  . Cerumen impaction 11/19/2015  . Ear discomfort 11/13/2015  . Grief at loss of child 09/14/2015  . Well adult exam 09/14/2015  . Dyslipidemia   . PSVT (paroxysmal supraventricular tachycardia) ([place])   . PAF (paroxysmal atrial fibrillation) ([place])   . Viral gastroenteritis 07/21/2015  . Acute upper respiratory infection 04/17/2015  . Edema 11/08/2014  . Chronic venous insufficiency 11/08/2014  . Diarrhea 05/01/2014  . Mass of left thigh 12/14/2013  . Microhematuria 12/14/2013  . Long term (current) use of anticoagulants 08/19/2013  . CAD (coronary artery disease) 05/21/2011  . Atrial tachycardia ([place]) 11/19/2010  . CHEST PAIN 08/07/2009  . Vitamin D deficiency 04/19/2008  . SUPERFICIAL THROMBOPHLEBITIS 10/20/2007  . DM2 (diabetes mellitus, type 2) ([place]) 03/16/2007  . HYPERCHOLESTEROLEMIA 03/15/2007  . Anxiety 03/15/2007  . Essential hypertension 03/15/2007  . Mitral valve disorder 03/15/2007  . BRONCHITIS 03/15/2007  . HIATAL HERNIA 03/15/2007  . Irritable bowel syndrome 03/15/2007  . Osteoarthritis 03/15/2007  . Osteoporosis 03/15/2007    Past Surgical History:  Procedure Laterality Date  . CARDIAC CATHETERIZATION  07/30/2010   nonobstructive CAD, 20-40% RCA stenosis, 50-60% eccentric LAD stenosis more prox, 40% LAD stenosis more distal, EF 65%  . TRANSTHORACIC  ECHOCARDIOGRAM  09/03/2010   EF=>55%; mild MR; mod TR; AV mildly sclerotic with trace regurg; mild pulm valve regurg  . VESICOVAGINAL FISTULA CLOSURE W/ TAH       OB History   No obstetric history on file.     Family History  Problem Relation Age of Onset  . Stroke Mother   . Stroke Father        also heart disease  . Clotting disorder  Paternal Grandmother        blood clot  . Stroke Brother        also heart disease    Social History   Tobacco Use  . Smoking status: Never Smoker  . Smokeless tobacco: Never Used  Vaping Use  . Vaping Use: Never used  Substance Use Topics  . Alcohol use: No  . Drug use: No    Home Medications Prior to Admission medications   Medication Sig Start Date End Date Taking? Authorizing Provider  ACCU-CHEK FASTCLIX LANCETS MISC Use to check blood sugars twice a day Dx E11.9 07/18/15   Plotnikov, Evie Lacks, MD  Alcohol Swabs (B-D SINGLE USE SWABS REGULAR) PADS Use to clean injection site to check blood sugars daily 05/16/19   Plotnikov, Evie Lacks, MD  ALPRAZolam (XANAX) 0.5 MG tablet TAKE 1/2 TO 1 TABLET BY MOUTH THREE TIMES DAILY AS NEEDED FOR ANXIETY. DO NOT TAKE WITH TRAMADOL.THIS IS A 30 DAY SUPPLY 12/20/19   Plotnikov, Evie Lacks, MD  amLODipine (NORVASC) 5 MG tablet Take 1 tablet (5 mg total) by mouth daily. 06/28/19   Plotnikov, Evie Lacks, MD  aspirin 81 MG tablet Take 81 mg by mouth daily.    [provider]  Cholecalciferol (VITAMIN D3) 50 MCG (2000 UT) capsule Take 1 capsule (2,000 Units total) by mouth daily. 08/02/19   Plotnikov, Evie Lacks, MD  clidinium-chlordiazePOXIDE (LIBRAX) 5-2.5 MG capsule TAKE ONE CAPSULE BY MOUTH THREE TIMES DAILY AS NEEDED FOR CRAMPS 05/02/19   Plotnikov, Evie Lacks, MD  diazepam (VALIUM) 5 MG tablet Take by mouth. 08/31/19   [provider]  diclofenac Sodium (VOLTAREN) 1 % GEL Apply 2 g topically 4 (four) times daily. 11/01/19   Aundra Dubin, PA-C  diphenoxylate-atropine (LOMOTIL) 2.5-0.025 MG tablet Take 1 tablet by mouth 4 (four) times daily as needed for diarrhea or loose stools. 04/27/20   Plotnikov, Evie Lacks, MD  furosemide (LASIX) 20 MG tablet TAKE 1 TABLET(20 MG) BY MOUTH DAILY AS NEEDED FOR SWELLING 02/22/20   Hilty, Nadean Corwin, MD  glucose blood (ACCU-CHEK AVIVA PLUS) test strip Use to check blood sugars twice a day 07/15/18    Plotnikov, Evie Lacks, MD  hydroxypropyl methylcellulose (ISOPTO TEARS) 2.5 % ophthalmic solution Place 1 drop into both eyes 2 (two) times daily.    [provider]  hydrOXYzine (ATARAX/VISTARIL) 25 MG tablet Take 1 tablet (25 mg total) by mouth every 8 (eight) hours as needed for itching. 01/19/19   Plotnikov, Evie Lacks, MD  isosorbide mononitrate (IMDUR) 30 MG 24 hr tablet TAKE 1 TABLET BY MOUTH EVERY DAY 07/05/19   Hilty, Nadean Corwin, MD  Lancets (ACCU-CHEK SOFT TOUCH) lancets Use as instructed 05/16/19   Plotnikov, Evie Lacks, MD  metoprolol succinate (TOPROL-XL) 50 MG 24 hr tablet TAKE 1 TABLET(50 MG) BY MOUTH TWICE DAILY 04/16/20   Hilty, Nadean Corwin, MD  ondansetron (ZOFRAN ODT) 4 MG disintegrating tablet Take 1 tablet (4 mg total) by mouth every 8 (eight) hours as needed for up to 10 doses for  nausea or vomiting. 05/01/20   Breck Coons, MD  ondansetron (ZOFRAN) 4 MG tablet Take 1 tablet (4 mg total) by mouth every 8 (eight) hours as needed for nausea or vomiting. 04/27/20   Plotnikov, Evie Lacks, MD  Oral Electrolytes (PEDIALYTE) PACK As directed for dehydration 04/27/20   Plotnikov, Evie Lacks, MD  pantoprazole (PROTONIX) 40 MG tablet TAKE 1 TABLET BY MOUTH EVERY DAY 06/06/19   Hilty, Nadean Corwin, MD  simvastatin (ZOCOR) 20 MG tablet TAKE 1 TABLET BY MOUTH AT BEDTIME 02/16/20   Plotnikov, Evie Lacks, MD  traMADol (ULTRAM) 50 MG tablet Take 1 tablet (50 mg total) by mouth 3 (three) times daily. 09/21/18   Plotnikov, Evie Lacks, MD  warfarin (COUMADIN) 5 MG tablet TAKE 1 TABLET BY MOUTH EVERY DAY AS DIRECTED BY COUMADIN CLINIC 03/23/20   Hilty, Nadean Corwin, MD    Allergies    Clarithromycin, Fosamax [alendronate sodium], Guaifenesin er, Metformin, Prandin [repaglinide], and Tape  Review of Systems   Review of Systems  Constitutional: Negative for fever.  HENT: Negative for ear pain.   Eyes: Negative for pain.  Respiratory: Positive for chest tightness. Negative for cough.   Cardiovascular: Positive  for palpitations.  Gastrointestinal: Negative for abdominal pain.  Genitourinary: Negative for flank pain.  Musculoskeletal: Negative for back pain.  Skin: Negative for rash.  Neurological: Negative for headaches.    Physical Exam Updated Vital Signs BP (!) 141/79   Pulse 60   Temp 98.6 F (37 C) (Oral)   Resp 18   SpO2 100%   Physical Exam Constitutional:      General: She is not in acute distress.    Appearance: Normal appearance.  HENT:     Head: Normocephalic.     Nose: Nose normal.  Eyes:     Extraocular Movements: Extraocular movements intact.  Cardiovascular:     Rate and Rhythm: Normal rate. Rhythm irregular.  Pulmonary:     Effort: Pulmonary effort is normal.  Musculoskeletal:        General: Normal range of motion.     Cervical back: Normal range of motion.  Neurological:     General: No focal deficit present.     Mental Status: She is alert. Mental status is at baseline.     ED Results / Procedures / Treatments   Labs (all labs ordered are listed, but only abnormal results are displayed) Labs Reviewed  BASIC METABOLIC PANEL - Abnormal; Notable for the following components:      Result Value   Sodium 133 (*)    Chloride 97 (*)    Glucose, Bld 146 (*)    GFR, Estimated 59 (*)    All other components within normal limits  PROTIME-INR - Abnormal; Notable for the following components:   Prothrombin Time 23.1 (*)    INR 2.1 (*)    All other components within normal limits  CBC  TROPONIN I (HIGH SENSITIVITY)  TROPONIN I (HIGH SENSITIVITY)    EKG None  Radiology DG Chest Port 1 View  Result Date: 05/12/2020 CLINICAL DATA:  Heart palpitations for the past 3 weeks, worse today. New left chest pain. Shortness of breath. COVID-19 positive test on 05/02/2019. EXAM: PORTABLE CHEST 1 VIEW COMPARISON:  05/08/2020 FINDINGS: Normal sized heart. Tortuous aorta. Clear lungs with normal vascularity. Unremarkable bones. IMPRESSION: No acute abnormality.  Electronically Signed   By: Claudie Revering M.D.   On: 05/12/2020 16:49    Procedures Procedures   Medications Ordered in ED Medications -  No data to display  ED Course  I have reviewed the triage vital signs and the nursing notes.  Pertinent labs & imaging results that were available during my care of the patient were reviewed by me and considered in my medical decision making (see chart for details).    MDM Rules/Calculators/A&P                          Patient presents with palpitations and chest pain in the setting of Covid for 10 days.  Denies pain at this time.  Vital signs otherwise within normal limits heart rate appears normal rate.  2 sets of troponin were sent both unremarkable.  Patient has no pain or discomfort at this time.  Will recommend outpatient follow-up with the doctors in 2 or 3 days.  Recommend immediate return for worsening pain fevers difficulty breathing or any additional concerns.     Final Clinical Impression(s) / ED Diagnoses Final diagnoses:  Chest pain    Rx / DC Orders ED Discharge Orders    None       Luna Fuse, MD 05/12/20 [phone]

## 2020-05-12 NOTE — Discharge Instructions (Signed)
Call your primary care doctor or specialist as discussed in the next 2-3 days.   Return immediately back to the ER if:  Your symptoms worsen within the next 12-24 hours. You develop new symptoms such as new fevers, persistent vomiting, new pain, shortness of breath, or new weakness or numbness, or if you have any other concerns.  

## 2020-05-12 NOTE — ED Notes (Signed)
Pt discharged from this ED in stable condition at this time. All discharge instructions and follow up care reviewed with pt with no further questions at this time. Pt ambulatory with steady gait, clear speech.  

## 2020-05-12 NOTE — ED Triage Notes (Addendum)
Pt reports heart palpitations x 3 weeks. States the palpitations became worse today and she had new onset left sided chest pain. Endorses shob. Denies N/V. Pt does not have defibrillator/pacemaker. Pt tested COVID + on 1/18.

## 2020-05-17 ENCOUNTER — Ambulatory Visit (INDEPENDENT_AMBULATORY_CARE_PROVIDER_SITE_OTHER): Payer: Medicare HMO

## 2020-05-17 ENCOUNTER — Encounter: Payer: Self-pay | Admitting: *Deleted

## 2020-05-17 ENCOUNTER — Ambulatory Visit (INDEPENDENT_AMBULATORY_CARE_PROVIDER_SITE_OTHER): Payer: Medicare HMO | Admitting: Internal Medicine

## 2020-05-17 ENCOUNTER — Encounter: Payer: Self-pay | Admitting: Internal Medicine

## 2020-05-17 ENCOUNTER — Other Ambulatory Visit: Payer: Self-pay

## 2020-05-17 VITALS — BP 140/72 | HR 66 | Ht 66.0 in | Wt 184.2 lb

## 2020-05-17 DIAGNOSIS — Z7901 Long term (current) use of anticoagulants: Secondary | ICD-10-CM | POA: Diagnosis not present

## 2020-05-17 DIAGNOSIS — I480 Paroxysmal atrial fibrillation: Secondary | ICD-10-CM

## 2020-05-17 DIAGNOSIS — R002 Palpitations: Secondary | ICD-10-CM

## 2020-05-17 DIAGNOSIS — Z8616 Personal history of COVID-19: Secondary | ICD-10-CM | POA: Diagnosis not present

## 2020-05-17 MED ORDER — METOPROLOL SUCCINATE ER 50 MG PO TB24: 75.0000 mg | ORAL_TABLET | Freq: Two times a day (BID) | ORAL | 3 refills | Status: DC

## 2020-05-17 NOTE — Progress Notes (Signed)
Patient ID: [name], female   DOB: 08-05-1934, 86 y.o.   MRN: [id number] Patient enrolled for [employer] to ship a 14 day ZIO XT long term holter monitor to her home.

## 2020-05-17 NOTE — Progress Notes (Signed)
OFFICE NOTE  Chief Complaint:  Palpitations chest pain  Primary Care Physician: Cassandria Anger, MD  HPI:  [name]  is a 86 year old female with a history of paroxysmal atrial fibrillation and SVT in the past. This happened when she underwent a Myoview for chest pain in the hospital. She was placed on metoprolol succinate 25 b.i.d. and has done well with that with improvement in her palpitations. Her echo showed an EF greater than 55% with some mild aortic insufficiency and an aneurysmal atrial septum. She is on simvastatin as well as Coumadin managed by you. She's recently had some abnormal labs which showed borderline diabetes. She's not currently a medication for that. She reports occasional intermittent chest pain. She underwent a cardia catheterization in the past which showed mild to moderate nonobstructive coronary disease which is managed medically. She does also have bilateral venous insufficiency of the lower chin and knees and has worn compression stockings with improvement in her swelling and symptoms.  [name] returns today without any new complaints. She denies any shortness of breath or chest pain. She does get some occasional chest wall pain and is relieved with tramadol.  I saw [name] back in the office today. Overall she reports she is doing very well. She's had very few episodes of palpitations. She has had a decreased dose in her amlodipine to 5 mg daily, likely due to leg swelling. She is now taking Lasix as needed for edema. She's had a few episodes of racing heart for which she is to perform vagal maneuvers successfully. EKG shows sinus rhythm with PVCs and PACs but no evidence of atrial fibrillation in the office today. She is on warfarin anticoagulation. We will check the INR in the office today.  [name] returns today for follow-up. Unfortunately her son died this past 08-13-22 of leukemia. She says he's been in and out of the hospital  for several months. She has had it small increase in palpitations recently but seems to be controlled with taking and a half dose of metoprolol. She denies any chest pain although has some pain behind her right shoulder blade. This is intermittent and has improved. Is not associated with exertion or relieved by rest. Her EKG today shows normal sinus rhythm without ischemia. Recent A1c was 6.8-she is not on diabetes medications. I do not see a recent lipid profile though she is on simvastatin 20 mg every OD.  04/24/2016  [name] returns today for follow-up. She has no new complaints. It's been about a year since she had the loss of her son due to leukemia. She seems to be doing fairly well although is routinely very calm and demeanor. Her blood pressure is well-controlled. Her INR was therapeutic today. She denies any worsening swelling, chest pain or shortness of breath. She denies any bleeding problems. She's not had any recurrent atrial fibrillation or palpitations that she is aware of. Her last echo was in 2012 and at the time she had mild nonobstructive coronary disease by catheterization. She was also noted to have moderate tricuspid regurgitation and some LVH. Her EKG today shows moderate LVH by voltage criteria.  04/28/2017  [name] returns today for follow-up.  She denies any chest pain or worsening shortness of breath.  She saw her primary care provider yesterday no medicine changes were made.  She is contemplating removing a small mass on the left inner thigh.  He has an appointment with a surgeon coming up and we  may need to hold her warfarin for that.  She has had no significant recurrent atrial fibrillation.  She is in sinus today.  She prefers to stay on warfarin.  INR has been therapeutic and is adjusted every 6 weeks.  04/29/2018  [name] seen today for routine annual follow-up.  Overall she is without complaints.  Her warfarin testing has been stable.  Blood pressures well  controlled today.  She denies chest pain or worsening shortness of breath.  Cholesterol has been at goal.  EKG personally reviewed shows sinus bradycardia and voltage criteria for LVH at 21.  04/2018  [name] seen today for follow-up.  She is accompanied by her daughter.  Overall she seems to be doing well.  She denies any chest pain or worsening shortness of breath.  She denies any symptomatic atrial fibrillation.  She remains on warfarin for anticoagulation.  She also takes metoprolol succinate which we recently increased to 50 mg twice daily for palpitations.  She was in the ER with A. fib and RVR.  She is noted since increasing her beta-blocker that her palpitations have improved.  12/27/2019  [name] returns for follow-up.  Overall she is doing well.  She has been having some complaints about a tendinitis in her left foot.  Otherwise blood pressure is controlled.  She has rare palpitations.  She is due for repeat labs although sees her primary care provider next month.  05/17/2020  [name] seen today in follow-up.  She recently had a COVID-19 infection in early January.  She was subsequently seen by Almyra Deforest, PA-C in the office.  She had noted increases in palpitations.  He advised her to take an extra half tablet of Toprol if necessary.  She says this really has not helped much.  She gets palpitations mostly at night and she says she cannot sleep basically every other night.  Her symptoms that apparently worsened and she went to the ER for this.  She was noted to be in sinus rhythm there and did not have any changes to her medications.  She is on warfarin.  Heart rate today is low in the 60s without irregularity.  It is difficult to know whether she is having recurrent A. fib or whether this could be sequelae of COVID-19 infection.  She does have a history of moderate nonobstructive coronary disease by cath but back in 2012.  PMHx:  Past Medical History:  Diagnosis Date  .  Anxiety state, unspecified   . Bronchitis, not specified as acute or chronic   . Diaphragmatic hernia without mention of obstruction or gangrene   . Dyslipidemia   . Essential hypertension   . Irritable bowel syndrome   . Mitral valve disorders(424.0)    a. 08/2010 Echo: EF >55%, mild MR, mod TR, trace AI.  [name] Non-obstructive CAD    a. 07/2010 Lexiscan MV: no ischemia, EF 78%; b. 07/2010 Cath: LM nl, LAD50/60p, 40 apical, LCX nl, RCA dominant, 30/40p, 53m.  . Osteoarthrosis, unspecified whether generalized or localized, unspecified site   . Osteoporosis, unspecified   . Other abnormal glucose   . PAF (paroxysmal atrial fibrillation) (HCC)    a. CHA2DS2VASc = 4-->coumadin.  [name] Phlebitis and thrombophlebitis of superficial vessels of lower extremities   . PSVT (paroxysmal supraventricular tachycardia) ([place])    a. 05/2012 Holter: short bursts of PSVT noted-->Managed with toprol.  [name] Unspecified venous (peripheral) insufficiency   . Unspecified vitamin D deficiency     Past Surgical History:  Procedure Laterality Date  . CARDIAC CATHETERIZATION  07/30/2010   nonobstructive CAD, 20-40% RCA stenosis, 50-60% eccentric LAD stenosis more prox, 40% LAD stenosis more distal, EF 65%  . TRANSTHORACIC ECHOCARDIOGRAM  09/03/2010   EF=>55%; mild MR; mod TR; AV mildly sclerotic with trace regurg; mild pulm valve regurg  . VESICOVAGINAL FISTULA CLOSURE W/ TAH      FAMHx:  Family History  Problem Relation Age of Onset  . Stroke Mother   . Stroke Father        also heart disease  . Clotting disorder Paternal Grandmother        blood clot  . Stroke Brother        also heart disease    SOCHx:   reports that she has never smoked. She has never used smokeless tobacco. She reports that she does not drink alcohol and does not use drugs.  ALLERGIES:  Allergies  Allergen Reactions  . Clarithromycin     REACTION: unspecified  . Fosamax [Alendronate Sodium]     achy  . Guaifenesin Er     itching  .  Metformin     REACTION: pt states INTOL to Metformin "felt drained"  . Prandin [Repaglinide]     Prandin dropped CBGs to 66 once - she stopped it.  . Tape     Adhesive tape---rash    ROS: Pertinent items noted in HPI and remainder of comprehensive ROS otherwise negative.  HOME MEDS: Current Outpatient Medications  Medication Sig Dispense Refill  . ACCU-CHEK FASTCLIX LANCETS MISC Use to check blood sugars twice a day Dx E11.9 102 each 2  . Alcohol Swabs (B-D SINGLE USE SWABS REGULAR) PADS Use to clean injection site to check blood sugars daily 300 each 3  . ALPRAZolam (XANAX) 0.5 MG tablet TAKE 1/2 TO 1 TABLET BY MOUTH THREE TIMES DAILY AS NEEDED FOR ANXIETY. DO NOT TAKE WITH TRAMADOL.THIS IS A 30 DAY SUPPLY 90 tablet 3  . amLODipine (NORVASC) 5 MG tablet Take 1 tablet (5 mg total) by mouth daily. 90 tablet 3  . aspirin 81 MG tablet Take 81 mg by mouth daily.    . Cholecalciferol (VITAMIN D3) 50 MCG (2000 UT) capsule Take 1 capsule (2,000 Units total) by mouth daily. 100 capsule 3  . clidinium-chlordiazePOXIDE (LIBRAX) 5-2.5 MG capsule TAKE ONE CAPSULE BY MOUTH THREE TIMES DAILY AS NEEDED FOR CRAMPS 20 capsule 2  . diazepam (VALIUM) 5 MG tablet Take by mouth.    . diclofenac Sodium (VOLTAREN) 1 % GEL Apply 2 g topically 4 (four) times daily. 150 g 1  . diphenoxylate-atropine (LOMOTIL) 2.5-0.025 MG tablet Take 1 tablet by mouth 4 (four) times daily as needed for diarrhea or loose stools. 60 tablet 0  . furosemide (LASIX) 20 MG tablet TAKE 1 TABLET(20 MG) BY MOUTH DAILY AS NEEDED FOR SWELLING 30 tablet 3  . glucose blood (ACCU-CHEK AVIVA PLUS) test strip Use to check blood sugars twice a day 100 each 2  . hydroxypropyl methylcellulose (ISOPTO TEARS) 2.5 % ophthalmic solution Place 1 drop into both eyes 2 (two) times daily.    . hydrOXYzine (ATARAX/VISTARIL) 25 MG tablet Take 1 tablet (25 mg total) by mouth every 8 (eight) hours as needed for itching. 60 tablet 0  . isosorbide mononitrate  (IMDUR) 30 MG 24 hr tablet TAKE 1 TABLET BY MOUTH EVERY DAY 30 tablet 11  . Lancets (ACCU-CHEK SOFT TOUCH) lancets Use as instructed 300 each 3  . metoprolol succinate (TOPROL-XL) 50 MG 24 hr  tablet TAKE 1 TABLET(50 MG) BY MOUTH TWICE DAILY 180 tablet 1  . ondansetron (ZOFRAN ODT) 4 MG disintegrating tablet Take 1 tablet (4 mg total) by mouth every 8 (eight) hours as needed for up to 10 doses for nausea or vomiting. 10 tablet 0  . ondansetron (ZOFRAN) 4 MG tablet Take 1 tablet (4 mg total) by mouth every 8 (eight) hours as needed for nausea or vomiting. 20 tablet 0  . Oral Electrolytes (PEDIALYTE) PACK As directed for dehydration 6 each 1  . pantoprazole (PROTONIX) 40 MG tablet TAKE 1 TABLET BY MOUTH EVERY DAY 90 tablet 3  . simvastatin (ZOCOR) 20 MG tablet TAKE 1 TABLET BY MOUTH AT BEDTIME 90 tablet 1  . traMADol (ULTRAM) 50 MG tablet Take 1 tablet (50 mg total) by mouth 3 (three) times daily. 90 tablet 3  . warfarin (COUMADIN) 5 MG tablet TAKE 1 TABLET BY MOUTH EVERY DAY AS DIRECTED BY COUMADIN CLINIC 90 tablet 0   No current facility-administered medications for this visit.    LABS/IMAGING: No results found for this or any previous visit (from the past 48 hour(s)). No results found.  VITALS: BP 140/72   Pulse 66   Ht 5\' 6"  (1.676 m)   Wt 184 lb 3.2 oz (83.6 kg)   SpO2 99%   BMI 29.73 kg/m   EXAM: General appearance: alert and no distress Neck: no carotid bruit, no JVD and thyroid not enlarged, symmetric, no tenderness/mass/nodules Lungs: clear to auscultation bilaterally Heart: regular rate and rhythm, S1, S2 normal, no murmur, click, rub or gallop Abdomen: soft, non-tender; bowel sounds normal; no masses,  no organomegaly Extremities: extremities normal, atraumatic, no cyanosis or edema Pulses: 2+ and symmetric Skin: Skin color, texture, turgor normal. No rashes or lesions Neurologic: Grossly normal Psych: Pleasant  EKG: Deferred  ASSESSMENT: 1. Paroxysmal atrial  fibrillation on warfarin - CHADSVASC score of 4 2. Recent COVID-19 infection (04/2019) 3. Hypertension-controlled 4. Dyslipidemia-on statin 5. Overweight 6. Moderate, non-obstructive CAD by cath in 2012 7. PSVT - controlled by b-blocker and vagal maneuvers 8. LVH, mild to moderate TR by echo 2012  PLAN: 1.   [name] is been describing recurrent palpitations and is having difficulty sleeping at night.  She had recent COVID-19 infection.  Taking an additional half tablet of the metoprolol has been minimally effective.  I advised an increase in her Toprol-XL to 75 mg twice daily.  We will also place a 2-week ZIO monitor.  She has an already established follow-up with Almyra Deforest, PA-C on March 1.  He can review the monitor with her at that time.  If she has any recurrent chest pain she may need a repeat stress testing or CT coronary angiogram given her history of nonobstructive coronary disease in the past.   Pixie Casino, MD, [facility], [place] Director of the Advanced Lipid Disorders &  Cardiovascular Risk Reduction Clinic Diplomate of the American Board of Clinical Lipidology Attending Cardiologist  Direct Dial: [phone]  Fax: [phone]  Website:  [email or web address] 05/17/2020, 9:29 AM

## 2020-05-17 NOTE — Patient Instructions (Addendum)
Medication Instructions:  INCREASE metoprolol to 75mg  twice daily  *If you need a refill on your cardiac medications before your next appointment, please call your pharmacy*  Testing/Procedures: [place] Monitor Instructions   Your physician has requested you wear your ZIO patch monitor 14 days.   This is a single patch monitor.  [employer] supplies one patch monitor per enrollment.  Additional stickers are not available.   Please do not apply patch if you will be having a Nuclear Stress Test, Echocardiogram, Cardiac CT, MRI, or Chest Xray during the time frame you would be wearing the monitor. The patch cannot be worn during these tests.  You cannot remove and re-apply the ZIO XT patch monitor.   Your ZIO patch monitor will be sent USPS Priority mail from [facility] directly to your home address. The monitor may also be mailed to a PO BOX if home delivery is not available.   It may take 3-5 days to receive your monitor after you have been enrolled.   Once you have received you monitor, please review enclosed instructions.  Your monitor has already been registered assigning a specific monitor serial # to you.   Applying the monitor   Shave hair from upper left chest.   Hold abrader disc by orange tab.  Rub abrader in 40 strokes over left upper chest as indicated in your monitor instructions.   Clean area with 4 enclosed alcohol pads .  Use all pads to assure are is cleaned thoroughly.  Let dry.   Apply patch as indicated in monitor instructions.  Patch will be place under collarbone on left side of chest with arrow pointing upward.   Rub patch adhesive wings for 2 minutes.Remove white label marked "1".  Remove white label marked "2".  Rub patch adhesive wings for 2 additional minutes.   While looking in a mirror, press and release button in center of patch.  A small green light will flash 3-4 times .  This will be your only indicator the monitor has been turned on.      Do not shower for the first 24 hours.  You may shower after the first 24 hours.   Press button if you feel a symptom. You will hear a small click.  Record Date, Time and Symptom in the Patient Log Book.   When you are ready to remove patch, follow instructions on last 2 pages of Patient Log Book.  Stick patch monitor onto last page of Patient Log Book.   Place Patient Log Book in [place] box.  Use locking tab on box and tape box closed securely.  The [employer] has [employer] on it.  Please place in mailbox as soon as possible.  Your physician should have your test results approximately 7 days after the monitor has been mailed back to [facility].   Call [place] at [phone] if you have questions regarding your ZIO XT patch monitor.  Call them immediately if you see an orange light blinking on your monitor.   If your monitor falls off in less than 4 days contact our Monitor department at [phone].  If your monitor becomes loose or falls off after 4 days call [employer] at [phone] for suggestions on securing your monitor.     Follow-Up: At [facility], you and your health needs are our priority.  As part of our continuing mission to provide you with exceptional heart care, we have created designated Provider Care  Teams.  These Care Teams include your primary Cardiologist (physician) and Advanced Practice Providers (APPs -  Physician Assistants and Nurse Practitioners) who all work together to provide you with the care you need, when you need it.  We recommend signing up for the patient portal called "MyChart".  Sign up information is provided on this After Visit Summary.  MyChart is used to connect with patients for Virtual Visits (Telemedicine).  Patients are able to view lab/test results, encounter notes, upcoming appointments, etc.  Non-urgent messages can be sent to your provider as well.   To learn more about what you can do with MyChart, go  to [email or web address].    Your next appointment:   March 1 @ 11:45am with Almyra Deforest PA  Other Instructions Colace (stool softener) Miralax Prune Juice

## 2020-05-18 ENCOUNTER — Ambulatory Visit (INDEPENDENT_AMBULATORY_CARE_PROVIDER_SITE_OTHER): Payer: Medicare HMO

## 2020-05-18 DIAGNOSIS — Z7901 Long term (current) use of anticoagulants: Secondary | ICD-10-CM

## 2020-05-18 DIAGNOSIS — I480 Paroxysmal atrial fibrillation: Secondary | ICD-10-CM | POA: Diagnosis not present

## 2020-05-18 LAB — POCT INR: INR: 3.6 — AB (ref 2.0–3.0)

## 2020-05-18 NOTE — Patient Instructions (Signed)
No warfarin tonight Friday February 4, then decrease to 1 tablet daily except 0.5 tablet on Monday and Friday.  Repeat INR in 1 week

## 2020-05-20 ENCOUNTER — Encounter (HOSPITAL_COMMUNITY): Payer: Self-pay

## 2020-05-20 ENCOUNTER — Other Ambulatory Visit: Payer: Self-pay

## 2020-05-20 ENCOUNTER — Emergency Department (HOSPITAL_COMMUNITY): Payer: Medicare HMO

## 2020-05-20 ENCOUNTER — Emergency Department (HOSPITAL_COMMUNITY)
Admission: EM | Admit: 2020-05-20 | Discharge: 2020-05-20 | Disposition: A | Discharge status: Home / Self Care | Payer: Medicare HMO | Attending: Emergency Medicine | Admitting: Emergency Medicine

## 2020-05-20 DIAGNOSIS — Z79899 Other long term (current) drug therapy: Secondary | ICD-10-CM | POA: Insufficient documentation

## 2020-05-20 DIAGNOSIS — Z7901 Long term (current) use of anticoagulants: Secondary | ICD-10-CM | POA: Diagnosis not present

## 2020-05-20 DIAGNOSIS — I10 Essential (primary) hypertension: Secondary | ICD-10-CM | POA: Diagnosis not present

## 2020-05-20 DIAGNOSIS — R197 Diarrhea, unspecified: Secondary | ICD-10-CM | POA: Diagnosis not present

## 2020-05-20 DIAGNOSIS — R002 Palpitations: Secondary | ICD-10-CM | POA: Diagnosis not present

## 2020-05-20 DIAGNOSIS — R0602 Shortness of breath: Secondary | ICD-10-CM | POA: Diagnosis not present

## 2020-05-20 DIAGNOSIS — I2510 Atherosclerotic heart disease of native coronary artery without angina pectoris: Secondary | ICD-10-CM | POA: Insufficient documentation

## 2020-05-20 DIAGNOSIS — Z7982 Long term (current) use of aspirin: Secondary | ICD-10-CM | POA: Diagnosis not present

## 2020-05-20 DIAGNOSIS — E119 Type 2 diabetes mellitus without complications: Secondary | ICD-10-CM | POA: Insufficient documentation

## 2020-05-20 DIAGNOSIS — D6832 Hemorrhagic disorder due to extrinsic circulating anticoagulants: Secondary | ICD-10-CM | POA: Diagnosis not present

## 2020-05-20 LAB — CBC WITH DIFFERENTIAL/PLATELET
Abs Immature Granulocytes: 0.03 10*3/uL (ref 0.00–0.07)
Basophils Absolute: 0 10*3/uL (ref 0.0–0.1)
Basophils Relative: 0 %
Eosinophils Absolute: 0.1 10*3/uL (ref 0.0–0.5)
Eosinophils Relative: 2 %
HCT: 35.3 % — ABNORMAL LOW (ref 36.0–46.0)
Hemoglobin: 11.8 g/dL — ABNORMAL LOW (ref 12.0–15.0)
Immature Granulocytes: 0 %
Lymphocytes Relative: 22 %
Lymphs Abs: 1.8 10*3/uL (ref 0.7–4.0)
MCH: 29.1 pg (ref 26.0–34.0)
MCHC: 33.4 g/dL (ref 30.0–36.0)
MCV: 86.9 fL (ref 80.0–100.0)
Monocytes Absolute: 1 10*3/uL (ref 0.1–1.0)
Monocytes Relative: 12 %
Neutro Abs: 5.5 10*3/uL (ref 1.7–7.7)
Neutrophils Relative %: 64 %
Platelets: 215 10*3/uL (ref 150–400)
RBC: 4.06 MIL/uL (ref 3.87–5.11)
RDW: 13.2 % (ref 11.5–15.5)
WBC: 8.5 10*3/uL (ref 4.0–10.5)
nRBC: 0 % (ref 0.0–0.2)

## 2020-05-20 LAB — BASIC METABOLIC PANEL
Anion gap: 10 (ref 5–15)
BUN: 6 mg/dL — ABNORMAL LOW (ref 8–23)
CO2: 27 mmol/L (ref 22–32)
Calcium: 9 mg/dL (ref 8.9–10.3)
Chloride: 94 mmol/L — ABNORMAL LOW (ref 98–111)
Creatinine, Ser: 0.91 mg/dL (ref 0.44–1.00)
GFR, Estimated: >60 mL/min (ref >60)
Glucose, Bld: 133 mg/dL — ABNORMAL HIGH (ref 70–99)
Potassium: 3.6 mmol/L (ref 3.5–5.1)
Sodium: 131 mmol/L — ABNORMAL LOW (ref 135–145)

## 2020-05-20 LAB — BRAIN NATRIURETIC PEPTIDE: B Natriuretic Peptide: 78.9 pg/mL (ref 0.0–100.0)

## 2020-05-20 LAB — MAGNESIUM: Magnesium: 2 mg/dL (ref 1.7–2.4)

## 2020-05-20 LAB — TROPONIN I (HIGH SENSITIVITY)
Troponin I (High Sensitivity): 5 ng/L (ref <18)
Troponin I (High Sensitivity): 5 ng/L (ref <18)

## 2020-05-20 LAB — PROTIME-INR
INR: 2 — ABNORMAL HIGH (ref 0.8–1.2)
Prothrombin Time: 21.6 seconds — ABNORMAL HIGH (ref 11.4–15.2)

## 2020-05-20 NOTE — ED Triage Notes (Addendum)
Pt sts shortness of breath that began tonight associated with ongoing palpitations of approx 3 weeks. Covid + 05/01/20

## 2020-05-20 NOTE — ED Notes (Signed)
Pt called ride to pick her up, took IV out and went over dc papers. Pt did not have questions

## 2020-05-20 NOTE — ED Provider Notes (Signed)
Care was taken over from Dr. Roxanne Mins.  Patient reports with palpitations and an episode of shortness of breath last night.  She currently does not have any shortness of breath and has not had any since she has been in the ED.  No associated chest pain.  She is in a sinus rhythm.  Her labs are nonconcerning.  She has some mild hyponatremia and mild anemia.  She is currently asymptomatic with no hypoxia.  No evidence of pulmonary edema.  No evidence of ACS.  She does not have symptoms that would be more concerning for PE.  There is no evidence of pneumonia or pulmonary edema.  She was discharged home in good condition.  She was encouraged to follow-up with her cardiologist.  Return precautions were given.  Results for orders placed or performed during the hospital encounter of 16/10/96  Basic metabolic panel  Result Value Ref Range   Sodium 131 (L) 135 - 145 mmol/L   Potassium 3.6 3.5 - 5.1 mmol/L   Chloride 94 (L) 98 - 111 mmol/L   CO2 27 22 - 32 mmol/L   Glucose, Bld 133 (H) 70 - 99 mg/dL   BUN 6 (L) 8 - 23 mg/dL   Creatinine, Ser 0.91 0.44 - 1.00 mg/dL   Calcium 9.0 8.9 - 10.3 mg/dL   GFR, Estimated >60 >60 mL/min   Anion gap 10 5 - 15  CBC with Differential  Result Value Ref Range   WBC 8.5 4.0 - 10.5 K/uL   RBC 4.06 3.87 - 5.11 MIL/uL   Hemoglobin 11.8 (L) 12.0 - 15.0 g/dL   HCT 35.3 (L) 36.0 - 46.0 %   MCV 86.9 80.0 - 100.0 fL   MCH 29.1 26.0 - 34.0 pg   MCHC 33.4 30.0 - 36.0 g/dL   RDW 13.2 11.5 - 15.5 %   Platelets 215 150 - 400 K/uL   nRBC 0.0 0.0 - 0.2 %   Neutrophils Relative % 64 %   Neutro Abs 5.5 1.7 - 7.7 K/uL   Lymphocytes Relative 22 %   Lymphs Abs 1.8 0.7 - 4.0 K/uL   Monocytes Relative 12 %   Monocytes Absolute 1.0 0.1 - 1.0 K/uL   Eosinophils Relative 2 %   Eosinophils Absolute 0.1 0.0 - 0.5 K/uL   Basophils Relative 0 %   Basophils Absolute 0.0 0.0 - 0.1 K/uL   Immature Granulocytes 0 %   Abs Immature Granulocytes 0.03 0.00 - 0.07 K/uL  Brain natriuretic  peptide  Result Value Ref Range   B Natriuretic Peptide 78.9 0.0 - 100.0 pg/mL  Magnesium  Result Value Ref Range   Magnesium 2.0 1.7 - 2.4 mg/dL  Protime-INR  Result Value Ref Range   Prothrombin Time 21.6 (H) 11.4 - 15.2 seconds   INR 2.0 (H) 0.8 - 1.2  Troponin I (High Sensitivity)  Result Value Ref Range   Troponin I (High Sensitivity) 5 <18 ng/L  Troponin I (High Sensitivity)  Result Value Ref Range   Troponin I (High Sensitivity) 5 <18 ng/L   DG Chest 2 View  Result Date: 05/20/2020 CLINICAL DATA:  Shortness of breath. COVID-19 infection on 05/01/2020 EXAM: CHEST - 2 VIEW COMPARISON:  Chest x-ray 05/12/2020, CT chest 06/25/2009 FINDINGS: The heart size and mediastinal contours are unchanged. Elevated left hemidiaphragm. No focal consolidation. No pulmonary edema. No pleural effusion. No pneumothorax. No acute osseous abnormality. IMPRESSION: No active cardiopulmonary disease. Electronically Signed   By: Iven Finn M.D.   On: 05/20/2020 01:51  DG Chest 2 View  Result Date: 05/08/2020 CLINICAL DATA:  COVID positive x1 week, heart racing on off for the last 2 days, denies chest pain, endorses generalized fatigue EXAM: CHEST - 2 VIEW COMPARISON:  Chest radiograph March 04, 2020 FINDINGS: The heart size and mediastinal contours are unchanged. Mild diffuse interstitial opacities superimposed on mild chronic coarsened interstitial changes. No new consolidation. No pleural effusion or pneumothorax. The visualized skeletal structures are unchanged. IMPRESSION: Mild diffuse interstitial opacities superimposed on mild chronic interstitial changes, could reflect atypical infection. No new consolidation. Electronically Signed   By: Dahlia Bailiff MD   On: 05/08/2020 15:29   CT Head Wo Contrast  Result Date: 05/01/2020 CLINICAL DATA:  Headache. EXAM: CT HEAD WITHOUT CONTRAST TECHNIQUE: Contiguous axial images were obtained from the base of the skull through the vertex without intravenous  contrast. COMPARISON:  None. FINDINGS: Brain: Mild chronic ischemic white matter disease is noted. No mass effect or midline shift is noted. Ventricular size is within normal limits. There is no evidence of mass lesion, hemorrhage or acute infarction. Vascular: No hyperdense vessel or unexpected calcification. Skull: Normal. Negative for fracture or focal lesion. Sinuses/Orbits: No acute finding. Other: None. IMPRESSION: Mild chronic ischemic white matter disease. No acute intracranial abnormality seen. Electronically Signed   By: Marijo Conception M.D.   On: 05/01/2020 15:28   DG Chest Port 1 View  Result Date: 05/12/2020 CLINICAL DATA:  Heart palpitations for the past 3 weeks, worse today. New left chest pain. Shortness of breath. COVID-19 positive test on 05/02/2019. EXAM: PORTABLE CHEST 1 VIEW COMPARISON:  05/08/2020 FINDINGS: Normal sized heart. Tortuous aorta. Clear lungs with normal vascularity. Unremarkable bones. IMPRESSION: No acute abnormality. Electronically Signed   By: Claudie Revering M.D.   On: 05/12/2020 16:49      Malvin Johns, MD 05/20/20 1227

## 2020-05-20 NOTE — ED Notes (Signed)
Pt ambulated to and from the bathroom and down the hallway, maintaining oxygen saturation at 100% the entire time.

## 2020-05-20 NOTE — ED Provider Notes (Signed)
[facility] DEPT Provider Note   CSN: [id number] Arrival date & time: 05/20/20  0110   History Chief Complaint  Patient presents with   Shortness of Breath    [name] is a 86 y.o. female.  The history is provided by the patient.  Shortness of Breath She has history of hypertension, diabetes, hyperlipidemia, paroxysmal atrial fibrillation anticoagulated on warfarin and comes in because of an episode of shortness of breath during the night.  Dyspnea has resolved.  She cannot tell me what things made her dyspnea worse or better.  She denies chest pain, heaviness, tightness, pressure.  She does tell me she had diarrhea last night as well as some mild nausea.  Nausea has resolved and diarrhea has resolved.  She denies fever, chills, sweats.  She denies any cough.  She also had some intermittent palpitations during the night, but this is a chronic problem.  She was diagnosed with COVID-19 on January 18.  She is vaccinated against COVID-19.  Past Medical History:  Diagnosis Date   Anxiety state, unspecified    Bronchitis, not specified as acute or chronic    Diaphragmatic hernia without mention of obstruction or gangrene    Dyslipidemia    Essential hypertension    Irritable bowel syndrome    Mitral valve disorders(424.0)    a. 08/2010 Echo: EF >55%, mild MR, mod TR, trace AI.   Non-obstructive CAD    a. 07/2010 Lexiscan MV: no ischemia, EF 78%; b. 07/2010 Cath: LM nl, LAD50/60p, 40 apical, LCX nl, RCA dominant, 30/40p, 58m.   Osteoarthrosis, unspecified whether generalized or localized, unspecified site    Osteoporosis, unspecified    Other abnormal glucose    PAF (paroxysmal atrial fibrillation) (HCC)    a. CHA2DS2VASc = 4-->coumadin.   Phlebitis and thrombophlebitis of superficial vessels of lower extremities    PSVT (paroxysmal supraventricular tachycardia) ([place])    a. 05/2012 Holter: short bursts of PSVT noted-->Managed with toprol.    Unspecified venous (peripheral) insufficiency    Unspecified vitamin D deficiency     Patient Active Problem List   Diagnosis Date Noted   Gastroenteritis 04/27/2020   Dehydration 04/27/2020   Urinary frequency 02/28/2020   Peroneal tendinitis 11/01/2019   Spontaneous rupture of extensor tendon of left foot 10/28/2019   Contusion of left foot 10/27/2019   Sinusitis 04/13/2019   Rash 12/05/2016   Dyspnea 04/24/2016   Tricuspid valve insufficiency 04/24/2016   Cerumen impaction 11/19/2015   Ear discomfort 11/13/2015   Grief at loss of child 09/14/2015   Well adult exam 09/14/2015   Dyslipidemia    PSVT (paroxysmal supraventricular tachycardia) (HCC)    PAF (paroxysmal atrial fibrillation) ([place])    Viral gastroenteritis 07/21/2015   Acute upper respiratory infection 04/17/2015   Edema 11/08/2014   Chronic venous insufficiency 11/08/2014   Diarrhea 05/01/2014   Mass of left thigh 12/14/2013   Microhematuria 12/14/2013   Long term (current) use of anticoagulants 08/19/2013   CAD (coronary artery disease) 05/21/2011   Atrial tachycardia ([place]) 11/19/2010   CHEST PAIN 08/07/2009   Vitamin D deficiency 04/19/2008   SUPERFICIAL THROMBOPHLEBITIS 10/20/2007   DM2 (diabetes mellitus, type 2) ([place]) 03/16/2007   HYPERCHOLESTEROLEMIA 03/15/2007   Anxiety 03/15/2007   Essential hypertension 03/15/2007   Mitral valve disorder 03/15/2007   BRONCHITIS 03/15/2007   HIATAL HERNIA 03/15/2007   Irritable bowel syndrome 03/15/2007   Osteoarthritis 03/15/2007   Osteoporosis 03/15/2007    Past Surgical History:  Procedure Laterality Date  CARDIAC CATHETERIZATION  07/30/2010   nonobstructive CAD, 20-40% RCA stenosis, 50-60% eccentric LAD stenosis more prox, 40% LAD stenosis more distal, EF 65%   TRANSTHORACIC ECHOCARDIOGRAM  09/03/2010   EF=>55%; mild MR; mod TR; AV mildly sclerotic with trace regurg; mild pulm valve regurg   VESICOVAGINAL  FISTULA CLOSURE W/ TAH       OB History   No obstetric history on file.     Family History  Problem Relation Age of Onset   Stroke Mother    Stroke Father        also heart disease   Clotting disorder Paternal Grandmother        blood clot   Stroke Brother        also heart disease    Social History   Tobacco Use   Smoking status: Never Smoker   Smokeless tobacco: Never Used  Vaping Use   Vaping Use: Never used  Substance Use Topics   Alcohol use: No   Drug use: No    Home Medications Prior to Admission medications   Medication Sig Start Date End Date Taking? Authorizing Provider  ACCU-CHEK FASTCLIX LANCETS MISC Use to check blood sugars twice a day Dx E11.9 07/18/15   Plotnikov, Evie Lacks, MD  Alcohol Swabs (B-D SINGLE USE SWABS REGULAR) PADS Use to clean injection site to check blood sugars daily 05/16/19   Plotnikov, Evie Lacks, MD  ALPRAZolam (XANAX) 0.5 MG tablet TAKE 1/2 TO 1 TABLET BY MOUTH THREE TIMES DAILY AS NEEDED FOR ANXIETY. DO NOT TAKE WITH TRAMADOL.THIS IS A 30 DAY SUPPLY 12/20/19   Plotnikov, Evie Lacks, MD  amLODipine (NORVASC) 5 MG tablet Take 1 tablet (5 mg total) by mouth daily. 06/28/19   Plotnikov, Evie Lacks, MD  aspirin 81 MG tablet Take 81 mg by mouth daily.    [provider]  Cholecalciferol (VITAMIN D3) 50 MCG (2000 UT) capsule Take 1 capsule (2,000 Units total) by mouth daily. 08/02/19   Plotnikov, Evie Lacks, MD  clidinium-chlordiazePOXIDE (LIBRAX) 5-2.5 MG capsule TAKE ONE CAPSULE BY MOUTH THREE TIMES DAILY AS NEEDED FOR CRAMPS 05/02/19   Plotnikov, Evie Lacks, MD  diazepam (VALIUM) 5 MG tablet Take by mouth. 08/31/19   [provider]  diclofenac Sodium (VOLTAREN) 1 % GEL Apply 2 g topically 4 (four) times daily. 11/01/19   Aundra Dubin, PA-C  diphenoxylate-atropine (LOMOTIL) 2.5-0.025 MG tablet Take 1 tablet by mouth 4 (four) times daily as needed for diarrhea or loose stools. 04/27/20   Plotnikov, Evie Lacks, MD  furosemide  (LASIX) 20 MG tablet TAKE 1 TABLET(20 MG) BY MOUTH DAILY AS NEEDED FOR SWELLING 02/22/20   Hilty, Nadean Corwin, MD  glucose blood (ACCU-CHEK AVIVA PLUS) test strip Use to check blood sugars twice a day 07/15/18   Plotnikov, Evie Lacks, MD  hydroxypropyl methylcellulose (ISOPTO TEARS) 2.5 % ophthalmic solution Place 1 drop into both eyes 2 (two) times daily.    [provider]  hydrOXYzine (ATARAX/VISTARIL) 25 MG tablet Take 1 tablet (25 mg total) by mouth every 8 (eight) hours as needed for itching. 01/19/19   Plotnikov, Evie Lacks, MD  isosorbide mononitrate (IMDUR) 30 MG 24 hr tablet TAKE 1 TABLET BY MOUTH EVERY DAY 07/05/19   Hilty, Nadean Corwin, MD  Lancets (ACCU-CHEK SOFT TOUCH) lancets Use as instructed 05/16/19   Plotnikov, Evie Lacks, MD  metoprolol succinate (TOPROL-XL) 50 MG 24 hr tablet Take 1.5 tablets (75 mg total) by mouth in the morning and at bedtime. Take with  or immediately following a meal. 05/17/20   Hilty, Nadean Corwin, MD  ondansetron (ZOFRAN ODT) 4 MG disintegrating tablet Take 1 tablet (4 mg total) by mouth every 8 (eight) hours as needed for up to 10 doses for nausea or vomiting. 05/01/20   Breck Coons, MD  ondansetron (ZOFRAN) 4 MG tablet Take 1 tablet (4 mg total) by mouth every 8 (eight) hours as needed for nausea or vomiting. 04/27/20   Plotnikov, Evie Lacks, MD  Oral Electrolytes (PEDIALYTE) PACK As directed for dehydration 04/27/20   Plotnikov, Evie Lacks, MD  pantoprazole (PROTONIX) 40 MG tablet TAKE 1 TABLET BY MOUTH EVERY DAY 06/06/19   Hilty, Nadean Corwin, MD  simvastatin (ZOCOR) 20 MG tablet TAKE 1 TABLET BY MOUTH AT BEDTIME 02/16/20   Plotnikov, Evie Lacks, MD  traMADol (ULTRAM) 50 MG tablet Take 1 tablet (50 mg total) by mouth 3 (three) times daily. 09/21/18   Plotnikov, Evie Lacks, MD  warfarin (COUMADIN) 5 MG tablet TAKE 1 TABLET BY MOUTH EVERY DAY AS DIRECTED BY COUMADIN CLINIC 03/23/20   Hilty, Nadean Corwin, MD    Allergies    Clarithromycin, Fosamax [alendronate sodium], Guaifenesin  er, Metformin, Prandin [repaglinide], and Tape  Review of Systems   Review of Systems  Respiratory: Positive for shortness of breath.   All other systems reviewed and are negative.   Physical Exam Updated Vital Signs BP (!) 144/68 (BP Location: Left Arm)    Pulse (!) 57    Temp 98 F (36.7 C) (Oral)    Resp 15    Ht 5\' 6"  (1.676 m)    Wt 83.5 kg    SpO2 99%    BMI 29.70 kg/m   Physical Exam Vitals and nursing note reviewed.   86 year old female, resting comfortably and in no acute distress. Vital signs are significant for borderline low heart rate and borderline elevated blood pressure. Oxygen saturation is 99%, which is normal. Head is normocephalic and atraumatic. PERRLA, EOMI. Oropharynx is clear. Neck is nontender and supple without adenopathy or JVD. Back is nontender and there is no CVA tenderness. Lungs are clear without rales, wheezes, or rhonchi. Chest is nontender. Heart has regular rate and rhythm without murmur. Abdomen is soft, flat, nontender without masses or hepatosplenomegaly and peristalsis is normoactive. Extremities have no cyanosis or edema, full range of motion is present. Skin is warm and dry without rash. Neurologic: Mental status is normal, cranial nerves are intact, there are no motor or sensory deficits.   ED Results / Procedures / Treatments   Labs (all labs ordered are listed, but only abnormal results are displayed) Labs Reviewed  CBC WITH DIFFERENTIAL/PLATELET - Abnormal; Notable for the following components:      Result Value   Hemoglobin 11.8 (*)    HCT 35.3 (*)    All other components within normal limits  PROTIME-INR - Abnormal; Notable for the following components:   Prothrombin Time 21.6 (*)    INR 2.0 (*)    All other components within normal limits  BASIC METABOLIC PANEL  BRAIN NATRIURETIC PEPTIDE  MAGNESIUM  TROPONIN I (HIGH SENSITIVITY)  TROPONIN I (HIGH SENSITIVITY)    EKG EKG Interpretation  Date/Time:  Sunday May 20 2020 01:33:26 EST Ventricular Rate:  53 PR Interval:    QRS Duration: 99 QT Interval:  430 QTC Calculation: 404 R Axis:   -27 Text Interpretation: Sinus rhythm Borderline prolonged PR interval Borderline left axis deviation Abnormal R-wave progression, early transition Nonspecific T abnormalities,  lateral leads When compared with ECG of 05/12/2020, No significant change was found Confirmed by Delora Fuel ([id number]) on 05/20/2020 1:41:48 AM   Radiology DG Chest 2 View  Result Date: 05/20/2020 CLINICAL DATA:  Shortness of breath. COVID-19 infection on 05/01/2020 EXAM: CHEST - 2 VIEW COMPARISON:  Chest x-ray 05/12/2020, CT chest 06/25/2009 FINDINGS: The heart size and mediastinal contours are unchanged. Elevated left hemidiaphragm. No focal consolidation. No pulmonary edema. No pleural effusion. No pneumothorax. No acute osseous abnormality. IMPRESSION: No active cardiopulmonary disease. Electronically Signed   By: Iven Finn M.D.   On: 05/20/2020 01:51    Procedures Procedures   Medications Ordered in ED Medications - No data to display  ED Course  I have reviewed the triage vital signs and the nursing notes.  Pertinent labs & imaging results that were available during my care of the patient were reviewed by me and considered in my medical decision making (see chart for details).  MDM Rules/Calculators/A&P Transient dyspnea of uncertain cause.  Exam is currently benign.  Diarrhea and nausea which most likely represent mild viral gastroenteritis, symptoms resolved.  Ongoing palpitations without evidence of significant arrhythmia.  Old records are reviewed, and complains of palpitations have been common and precipitated several ED visits, but she normally does not complain of dyspnea.  Chest x-ray shows no evidence of pneumonia or heart failure.  Oxygen saturation is excellent at rest.  Will check oxygen saturation with ambulation and check screening labs.  INR is therapeutic.  CBC is  significant for mild anemia which is not significantly changed from baseline.  Metabolic panel, troponin, BNP are pending.  Case is signed out to Dr. Tamera Punt.  Final Clinical Impression(s) / ED Diagnoses Final diagnoses:  Shortness of breath  Palpitations  Anticoagulated on warfarin  Diarrhea, unspecified type    Rx / DC Orders ED Discharge Orders    None       Delora Fuel, MD 85/46/27 [phone]

## 2020-05-21 ENCOUNTER — Other Ambulatory Visit: Payer: Self-pay | Admitting: Internal Medicine

## 2020-05-22 ENCOUNTER — Telehealth: Payer: Self-pay | Admitting: Internal Medicine

## 2020-05-22 NOTE — Telephone Encounter (Signed)
[facility] Report/Call : ---Caller states she feels like she is having increasing symptoms of heart palpitations . She felt short of breath last night but her main symptom today is the rapid heart rate.  Advised call 911 now.  Caller states she does not want to call 911 to take her to the ED but she will call a family member right now to take her to the ED

## 2020-05-22 NOTE — Telephone Encounter (Signed)
Patient calling complaining of shortness of breath, cramps, diarrhea, and heart flutter occasionally. Transferred to team health for further evaluation.

## 2020-05-24 ENCOUNTER — Emergency Department (HOSPITAL_COMMUNITY)
Admission: EM | Admit: 2020-05-24 | Discharge: 2020-05-24 | Disposition: A | Discharge status: Home / Self Care | Payer: Medicare HMO | Attending: Emergency Medicine | Admitting: Emergency Medicine

## 2020-05-24 ENCOUNTER — Other Ambulatory Visit: Payer: Self-pay

## 2020-05-24 ENCOUNTER — Emergency Department (HOSPITAL_COMMUNITY): Payer: Medicare HMO

## 2020-05-24 ENCOUNTER — Encounter (HOSPITAL_COMMUNITY): Payer: Self-pay

## 2020-05-24 ENCOUNTER — Telehealth: Payer: Self-pay | Admitting: Internal Medicine

## 2020-05-24 DIAGNOSIS — I491 Atrial premature depolarization: Secondary | ICD-10-CM | POA: Insufficient documentation

## 2020-05-24 DIAGNOSIS — E119 Type 2 diabetes mellitus without complications: Secondary | ICD-10-CM | POA: Diagnosis not present

## 2020-05-24 DIAGNOSIS — R002 Palpitations: Secondary | ICD-10-CM

## 2020-05-24 DIAGNOSIS — D649 Anemia, unspecified: Secondary | ICD-10-CM | POA: Insufficient documentation

## 2020-05-24 DIAGNOSIS — R791 Abnormal coagulation profile: Secondary | ICD-10-CM | POA: Diagnosis not present

## 2020-05-24 DIAGNOSIS — J9811 Atelectasis: Secondary | ICD-10-CM | POA: Insufficient documentation

## 2020-05-24 DIAGNOSIS — Z7901 Long term (current) use of anticoagulants: Secondary | ICD-10-CM | POA: Diagnosis not present

## 2020-05-24 DIAGNOSIS — I2510 Atherosclerotic heart disease of native coronary artery without angina pectoris: Secondary | ICD-10-CM | POA: Diagnosis not present

## 2020-05-24 DIAGNOSIS — I10 Essential (primary) hypertension: Secondary | ICD-10-CM | POA: Insufficient documentation

## 2020-05-24 DIAGNOSIS — Z79899 Other long term (current) drug therapy: Secondary | ICD-10-CM | POA: Insufficient documentation

## 2020-05-24 LAB — BASIC METABOLIC PANEL
Anion gap: 9 (ref 5–15)
BUN: 8 mg/dL (ref 8–23)
CO2: 28 mmol/L (ref 22–32)
Calcium: 9.2 mg/dL (ref 8.9–10.3)
Chloride: 96 mmol/L — ABNORMAL LOW (ref 98–111)
Creatinine, Ser: 0.82 mg/dL (ref 0.44–1.00)
GFR, Estimated: >60 mL/min (ref >60)
Glucose, Bld: 179 mg/dL — ABNORMAL HIGH (ref 70–99)
Potassium: 3.4 mmol/L — ABNORMAL LOW (ref 3.5–5.1)
Sodium: 133 mmol/L — ABNORMAL LOW (ref 135–145)

## 2020-05-24 LAB — CBC
HCT: 34.1 % — ABNORMAL LOW (ref 36.0–46.0)
Hemoglobin: 11.3 g/dL — ABNORMAL LOW (ref 12.0–15.0)
MCH: 29.4 pg (ref 26.0–34.0)
MCHC: 33.1 g/dL (ref 30.0–36.0)
MCV: 88.8 fL (ref 80.0–100.0)
Platelets: 234 10*3/uL (ref 150–400)
RBC: 3.84 MIL/uL — ABNORMAL LOW (ref 3.87–5.11)
RDW: 13.2 % (ref 11.5–15.5)
WBC: 6.8 10*3/uL (ref 4.0–10.5)
nRBC: 0 % (ref 0.0–0.2)

## 2020-05-24 LAB — PROTIME-INR
INR: 1.6 — ABNORMAL HIGH (ref 0.8–1.2)
Prothrombin Time: 18.9 seconds — ABNORMAL HIGH (ref 11.4–15.2)

## 2020-05-24 LAB — TROPONIN I (HIGH SENSITIVITY)
Troponin I (High Sensitivity): 5 ng/L (ref <18)
Troponin I (High Sensitivity): 5 ng/L (ref <18)

## 2020-05-24 LAB — TSH: TSH: 0.856 u[IU]/mL (ref 0.350–4.500)

## 2020-05-24 LAB — MAGNESIUM: Magnesium: 2.1 mg/dL (ref 1.7–2.4)

## 2020-05-24 NOTE — Telephone Encounter (Signed)
Attempted to contact triage to transfer patient, but I did not get an answer.

## 2020-05-24 NOTE — ED Notes (Signed)
Pt is calling her grandson at this time for transportation home. Per PA: PA spoke with pts daughter regarding pts care.

## 2020-05-24 NOTE — ED Provider Notes (Addendum)
Rule DEPT Provider Note   CSN: [id number] Arrival date & time: 05/24/20  1258     History Chief Complaint  Patient presents with  . Palpitations  . Hypertension  . Fatigue    [name] is a 86 y.o. female.  HPI   Pt is an 86 y/o F with a h/o bronchitis, diaphragmatic hernia, dyslipidemia, mitral valve regurgitation, CAD, osteoporosis, paroxysmal afib, PSVT, who presents to the ED today for eval of fatigue, HTN and palpitations. Pt reports palpitation that have been ongoing for "a long time". She has been seen in the ED multiple times for this and has recently been seen by her cardiologist and has a heart monitor on.  Pt states she had an episode of palpitations, shortness of breath and some upper chest pain that occurred at night and sxs kept her up. Reports associated nausea, but denies diaphoresis, cough, fever, ble or orthopnea. Has no cp at this time.  She reports she has been compliant with her coumadin, but did not States she take her medications this morning  Past Medical History:  Diagnosis Date  . Anxiety state, unspecified   . Bronchitis, not specified as acute or chronic   . Diaphragmatic hernia without mention of obstruction or gangrene   . Dyslipidemia   . Essential hypertension   . Irritable bowel syndrome   . Mitral valve disorders(424.0)    a. 08/2010 Echo: EF >55%, mild MR, mod TR, trace AI.  [name] Non-obstructive CAD    a. 07/2010 Lexiscan MV: no ischemia, EF 78%; b. 07/2010 Cath: LM nl, LAD50/60p, 40 apical, LCX nl, RCA dominant, 30/40p, 52m.  . Osteoarthrosis, unspecified whether generalized or localized, unspecified site   . Osteoporosis, unspecified   . Other abnormal glucose   . PAF (paroxysmal atrial fibrillation) (HCC)    a. CHA2DS2VASc = 4-->coumadin.  [name] Phlebitis and thrombophlebitis of superficial vessels of lower extremities   . PSVT (paroxysmal supraventricular tachycardia) ([place])    a. 05/2012 Holter:  short bursts of PSVT noted-->Managed with toprol.  [name] Unspecified venous (peripheral) insufficiency   . Unspecified vitamin D deficiency     Patient Active Problem List   Diagnosis Date Noted  . Gastroenteritis 04/27/2020  . Dehydration 04/27/2020  . Urinary frequency 02/28/2020  . Peroneal tendinitis 11/01/2019  . Spontaneous rupture of extensor tendon of left foot 10/28/2019  . Contusion of left foot 10/27/2019  . Sinusitis 04/13/2019  . Rash 12/05/2016  . Dyspnea 04/24/2016  . Tricuspid valve insufficiency 04/24/2016  . Cerumen impaction 11/19/2015  . Ear discomfort 11/13/2015  . Grief at loss of child 09/14/2015  . Well adult exam 09/14/2015  . Dyslipidemia   . PSVT (paroxysmal supraventricular tachycardia) ([place])   . PAF (paroxysmal atrial fibrillation) ([place])   . Viral gastroenteritis 07/21/2015  . Acute upper respiratory infection 04/17/2015  . Edema 11/08/2014  . Chronic venous insufficiency 11/08/2014  . Diarrhea 05/01/2014  . Mass of left thigh 12/14/2013  . Microhematuria 12/14/2013  . Long term (current) use of anticoagulants 08/19/2013  . CAD (coronary artery disease) 05/21/2011  . Atrial tachycardia ([place]) 11/19/2010  . CHEST PAIN 08/07/2009  . Vitamin D deficiency 04/19/2008  . SUPERFICIAL THROMBOPHLEBITIS 10/20/2007  . DM2 (diabetes mellitus, type 2) ([place]) 03/16/2007  . HYPERCHOLESTEROLEMIA 03/15/2007  . Anxiety 03/15/2007  . Essential hypertension 03/15/2007  . Mitral valve disorder 03/15/2007  . BRONCHITIS 03/15/2007  . HIATAL HERNIA 03/15/2007  . Irritable bowel syndrome 03/15/2007  . Osteoarthritis 03/15/2007  .  Osteoporosis 03/15/2007    Past Surgical History:  Procedure Laterality Date  . CARDIAC CATHETERIZATION  07/30/2010   nonobstructive CAD, 20-40% RCA stenosis, 50-60% eccentric LAD stenosis more prox, 40% LAD stenosis more distal, EF 65%  . TRANSTHORACIC ECHOCARDIOGRAM  09/03/2010   EF=>55%; mild MR; mod TR; AV mildly sclerotic with trace  regurg; mild pulm valve regurg  . VESICOVAGINAL FISTULA CLOSURE W/ TAH       OB History   No obstetric history on file.     Family History  Problem Relation Age of Onset  . Stroke Mother   . Stroke Father        also heart disease  . Clotting disorder Paternal Grandmother        blood clot  . Stroke Brother        also heart disease    Social History   Tobacco Use  . Smoking status: Never Smoker  . Smokeless tobacco: Never Used  Vaping Use  . Vaping Use: Never used  Substance Use Topics  . Alcohol use: No  . Drug use: No    Home Medications Prior to Admission medications   Medication Sig Start Date End Date Taking? Authorizing Provider  ACCU-CHEK FASTCLIX LANCETS MISC Use to check blood sugars twice a day Dx E11.9 Patient taking differently: 1 each by Other route in the morning and at bedtime. 07/18/15   Plotnikov, Evie Lacks, MD  Alcohol Swabs (B-D SINGLE USE SWABS REGULAR) PADS Use to clean injection site to check blood sugars daily Patient taking differently: 1 each by Other route See admin instructions. Use to clean injection site to check blood sugars daily 05/16/19   Plotnikov, Evie Lacks, MD  ALPRAZolam (XANAX) 0.5 MG tablet TAKE 1/2 TO 1 TABLET BY MOUTH THREE TIMES DAILY AS NEEDED FOR ANXIETY. DO NOT TAKE WITH TRAMADOL.THIS IS A 30 DAY SUPPLY Patient taking differently: Take 0.25-0.5 mg by mouth 3 (three) times daily as needed for anxiety (DO NOT TAKE WITH TRAMADOL). 12/20/19   Plotnikov, Evie Lacks, MD  amLODipine (NORVASC) 5 MG tablet Take 1 tablet (5 mg total) by mouth daily. 06/28/19   Plotnikov, Evie Lacks, MD  aspirin 81 MG tablet Take 81 mg by mouth daily.    [provider]  Cholecalciferol (VITAMIN D3) 50 MCG (2000 UT) capsule Take 1 capsule (2,000 Units total) by mouth daily. 08/02/19   Plotnikov, Evie Lacks, MD  clidinium-chlordiazePOXIDE (LIBRAX) 5-2.5 MG capsule TAKE 1 CAPSULE BY MOUTH THREE TIMES DAILY AS NEEDED FOR CRAMPS 05/22/20   Plotnikov, Evie Lacks,  MD  diazepam (VALIUM) 5 MG tablet Take by mouth. 08/31/19   [provider]  diclofenac Sodium (VOLTAREN) 1 % GEL Apply 2 g topically 4 (four) times daily. 11/01/19   Aundra Dubin, PA-C  diphenoxylate-atropine (LOMOTIL) 2.5-0.025 MG tablet Take 1 tablet by mouth 4 (four) times daily as needed for diarrhea or loose stools. 04/27/20   Plotnikov, Evie Lacks, MD  furosemide (LASIX) 20 MG tablet TAKE 1 TABLET(20 MG) BY MOUTH DAILY AS NEEDED FOR SWELLING Patient taking differently: Take 20 mg by mouth daily as needed for edema (swelling). 02/22/20   Hilty, Nadean Corwin, MD  glucose blood (ACCU-CHEK AVIVA PLUS) test strip Use to check blood sugars twice a day Patient taking differently: 1 each by Other route in the morning and at bedtime. 07/15/18   Plotnikov, Evie Lacks, MD  hydroxypropyl methylcellulose (ISOPTO TEARS) 2.5 % ophthalmic solution Place 1 drop into both eyes 2 (two) times daily.  [provider]  hydrOXYzine (ATARAX/VISTARIL) 25 MG tablet Take 1 tablet (25 mg total) by mouth every 8 (eight) hours as needed for itching. 01/19/19   Plotnikov, Evie Lacks, MD  isosorbide mononitrate (IMDUR) 30 MG 24 hr tablet TAKE 1 TABLET BY MOUTH EVERY DAY Patient taking differently: Take 30 mg by mouth daily. 07/05/19   Hilty, Nadean Corwin, MD  Lancets (ACCU-CHEK SOFT TOUCH) lancets Use as instructed Patient taking differently: 1 each by Other route as directed. 05/16/19   Plotnikov, Evie Lacks, MD  metoprolol succinate (TOPROL-XL) 50 MG 24 hr tablet Take 1.5 tablets (75 mg total) by mouth in the morning and at bedtime. Take with or immediately following a meal. 05/17/20   Hilty, Nadean Corwin, MD  ondansetron (ZOFRAN ODT) 4 MG disintegrating tablet Take 1 tablet (4 mg total) by mouth every 8 (eight) hours as needed for up to 10 doses for nausea or vomiting. 05/01/20   Breck Coons, MD  ondansetron (ZOFRAN) 4 MG tablet Take 1 tablet (4 mg total) by mouth every 8 (eight) hours as needed for nausea or vomiting.  04/27/20   Plotnikov, Evie Lacks, MD  Oral Electrolytes (PEDIALYTE) PACK As directed for dehydration Patient taking differently: 1 each by Other route See admin instructions. As directed for dehydration 04/27/20   Plotnikov, Evie Lacks, MD  pantoprazole (PROTONIX) 40 MG tablet TAKE 1 TABLET BY MOUTH EVERY DAY Patient taking differently: Take 40 mg by mouth daily. 06/06/19   Hilty, Nadean Corwin, MD  simvastatin (ZOCOR) 20 MG tablet TAKE 1 TABLET BY MOUTH AT BEDTIME Patient taking differently: Take 20 mg by mouth at bedtime. 02/16/20   Plotnikov, Evie Lacks, MD  traMADol (ULTRAM) 50 MG tablet Take 1 tablet (50 mg total) by mouth 3 (three) times daily. 09/21/18   Plotnikov, Evie Lacks, MD  warfarin (COUMADIN) 5 MG tablet TAKE 1 TABLET BY MOUTH EVERY DAY AS DIRECTED BY COUMADIN CLINIC Patient taking differently: Take 5 mg by mouth See admin instructions. TAKE 1 TABLET BY MOUTH EVERY DAY AS DIRECTED BY COUMADIN CLINIC 03/23/20   Hilty, Nadean Corwin, MD    Allergies    Clarithromycin, Fosamax [alendronate sodium], Guaifenesin er, Metformin, Prandin [repaglinide], and Tape  Review of Systems   Review of Systems  Constitutional: Negative for diaphoresis and fever.  HENT: Negative for ear pain and sore throat.   Eyes: Negative for visual disturbance.  Respiratory: Positive for shortness of breath. Negative for cough.   Cardiovascular: Positive for chest pain and palpitations. Negative for leg swelling.  Gastrointestinal: Positive for nausea. Negative for abdominal pain, constipation, diarrhea and vomiting.  Genitourinary: Negative for dysuria and hematuria.  Musculoskeletal: Negative for back pain.  Skin: Negative for rash.  Neurological: Negative for headaches.  All other systems reviewed and are negative.   Physical Exam Updated Vital Signs BP 134/63   Pulse 68   Temp 98.3 F (36.8 C) (Oral)   Resp 18   Ht 5\' 6"  (1.676 m)   Wt 81.6 kg   SpO2 100%   BMI 29.05 kg/m   Physical Exam Vitals and  nursing note reviewed.  Constitutional:      General: She is not in acute distress.    Appearance: She is well-developed and well-nourished.  HENT:     Head: Normocephalic and atraumatic.  Eyes:     Conjunctiva/sclera: Conjunctivae normal.  Cardiovascular:     Rate and Rhythm: Normal rate and regular rhythm.     Heart sounds: Normal heart sounds. No murmur  heard.     Comments: Intermittent pvs on monitor Pulmonary:     Effort: Pulmonary effort is normal. No respiratory distress.     Breath sounds: Normal breath sounds. No wheezing or rales.  Abdominal:     General: Bowel sounds are normal.     Palpations: Abdomen is soft.     Tenderness: There is no abdominal tenderness. There is no guarding or rebound.  Musculoskeletal:        General: No edema.     Cervical back: Neck supple.  Skin:    General: Skin is warm and dry.  Neurological:     Mental Status: She is alert.  Psychiatric:        Mood and Affect: Mood and affect normal.     ED Results / Procedures / Treatments   Labs (all labs ordered are listed, but only abnormal results are displayed) Labs Reviewed  BASIC METABOLIC PANEL - Abnormal; Notable for the following components:      Result Value   Sodium 133 (*)    Potassium 3.4 (*)    Chloride 96 (*)    Glucose, Bld 179 (*)    All other components within normal limits  CBC - Abnormal; Notable for the following components:   RBC 3.84 (*)    Hemoglobin 11.3 (*)    HCT 34.1 (*)    All other components within normal limits  PROTIME-INR - Abnormal; Notable for the following components:   Prothrombin Time 18.9 (*)    INR 1.6 (*)    All other components within normal limits  MAGNESIUM  TSH  TROPONIN I (HIGH SENSITIVITY)  TROPONIN I (HIGH SENSITIVITY)    EKG EKG Interpretation  Date/Time:  Thursday May 24 2020 13:12:33 EST Ventricular Rate:  68 PR Interval:    QRS Duration: 106 QT Interval:  392 QTC Calculation: 417 R Axis:   -15 Text  Interpretation: Sinus rhythm Atrial premature complex LVH with secondary repolarization abnormality Anterior infarct, old 51 Lead; [place] Confirmed by Quintella Reichert [phone]) on 05/24/2020 3:14:41 PM   Radiology DG Chest 2 View  Result Date: 05/24/2020 CLINICAL DATA:  Cardiac palpitations EXAM: CHEST - 2 VIEW COMPARISON:  May 20, 2020 FINDINGS: There is mild left base atelectasis. The lungs elsewhere are clear. Heart size and pulmonary vascularity are normal. No adenopathy. Monitor device present on the left anteriorly. No bone lesions. IMPRESSION: Mild left base atelectasis. Lungs elsewhere clear. Heart size normal. Electronically Signed   By: Lowella Grip III M.D.   On: 05/24/2020 13:58    Procedures Procedures   Medications Ordered in ED Medications - No data to display  ED Course  I have reviewed the triage vital signs and the nursing notes.  Pertinent labs & imaging results that were available during my care of the patient were reviewed by me and considered in my medical decision making (see chart for details).    MDM Rules/Calculators/A&P                          86 y/o F presenting with palpitations. Recent frequent visits for same with reassuring w/u. Has also seen cards and is wearing and event monitor.  Reviewed/interpreted labs CBC with mild anemia that appears stable BMP with mild hypokalemia, otherwise reassuring INR at 1.6 - will have her f/u for recheck of this Mag wnl Delta trops are neg TSH wnl  EKG With inus rhythm Atrial premature complex LVH with secondary repolarization abnormality  Anterior infarct, old   CXR reviewed/interpreted - Mild left base atelectasis. Lungs elsewhere clear. Heart size normal.  Highly doubt ACS, sxs atypical for these. Doubt PE given she is currently anticoagulated and vs are wnl. Ambulated with normal sats at 90 and greater. She did not have any tachypneic or dyspnea with ambulation. Doubt dissection or other emergent  cause of sxs. She did have pvcs on her ekg and on the monitor. This could be the cause of her sxs. She does not have any evidence of a life threatening arrhythmia or other life threatening cause of palpitations. Recommend close f/u with her cardiologist and strict return precautions.  Case discussed with Dr. Ralene Bathe who personally evaluated the patient and is in agreement with the plan.   Final Clinical Impression(s) / ED Diagnoses Final diagnoses:  Palpitations  Premature atrial contraction  Subtherapeutic international normalized ratio (INR)    Rx / DC Orders ED Discharge Orders    None       Rodney Booze, PA-C 05/24/20 1813    Rodney Booze, PA-C 05/24/20 1846    Quintella Reichert, MD 05/24/20 2024

## 2020-05-24 NOTE — Telephone Encounter (Signed)
Spoke with pt and continues to c/o SOB, chest heaviness, and feeling"nervous" Per pt did not sleep at all last night and sounded very tired on phone tried to get pt to check B/P and heart rate was unable to get B/P machine working  Pt has not taken morning meds at this time either.Per pt monitor was applied yesterday by pt's niece Per pt permission given to speak with niece Maddie [phone] left message for niece to call back Left message for niece to see if could check on pt and see if could get B/P and heart rate reading . Will forward message to Dr Debara Pickett for review and recommendations ./cy

## 2020-05-24 NOTE — ED Notes (Signed)
Fall Bundle: Yellow socks Yellow wrist band Yellow socks Fall risk sign outside door

## 2020-05-24 NOTE — ED Notes (Signed)
Pt provided with water,gingerale, [place] sandwich, and applesauce.

## 2020-05-24 NOTE — ED Triage Notes (Signed)
Patient c/o fatigue, hypertension, and palpitations. Patient states she is wearing a heart monitor. HR- 66

## 2020-05-24 NOTE — Telephone Encounter (Signed)
Pt c/o Shortness Of Breath: STAT if SOB developed within the last 24 hours or pt is noticeably SOB on the phone  1. Are you currently SOB (can you hear that pt is SOB on the phone)? Patient states she is currently SOB  2. How long have you been experiencing SOB? 1 day, per patient  3. Are you SOB when sitting or when up moving around?  Both   4. Are you currently experiencing any other symptoms?  palpitations  Patient c/o Palpitations:  High priority if patient c/o lightheadedness, shortness of breath, or chest pain  1) How long have you had palpitations/irregular HR/ Afib? Are you having the symptoms now? Patient states she has had palpitations off and on for months  2) Are you currently experiencing lightheadedness, SOB or CP? Patient is currently SOB   3) Do you have a history of afib (atrial fibrillation) or irregular heart rhythm? Yes   4) Have you checked your BP or HR? (document readings if available):  No log available, but she states her BP and HR have been fine  5) Are you experiencing any other symptoms? No

## 2020-05-24 NOTE — ED Notes (Signed)
Patient attached to external female catheter 

## 2020-05-24 NOTE — ED Notes (Signed)
Patient stated that she is too weak to stand and walk

## 2020-05-24 NOTE — ED Notes (Signed)
Ambulated patient to bathroom with front wheel walker O2 stayed between 90-97  Ambulated patient in room w/o front wheel walker patients O2 stayed between 95-97

## 2020-05-24 NOTE — Telephone Encounter (Signed)
Not sure what to do for her -just seen in the office, has been in the ER for this as well. Hopefully the monitor will show something.  Dr Lemmie Evens

## 2020-05-24 NOTE — Discharge Instructions (Signed)
Your work-up today was reassuring.  You did not have any evidence of a heart attack today.  You do not have any evidence of any other life-threatening emergencies.  Your INR was low and you will need to have this rechecked tomorrow.  Please follow-up with your cardiologist and return to the emergency department for any new or worsening symptoms

## 2020-05-25 ENCOUNTER — Ambulatory Visit (INDEPENDENT_AMBULATORY_CARE_PROVIDER_SITE_OTHER): Payer: Medicare HMO

## 2020-05-25 DIAGNOSIS — I480 Paroxysmal atrial fibrillation: Secondary | ICD-10-CM

## 2020-05-25 DIAGNOSIS — Z7901 Long term (current) use of anticoagulants: Secondary | ICD-10-CM

## 2020-05-25 LAB — POCT INR: INR: 2 (ref 2.0–3.0)

## 2020-05-25 NOTE — Patient Instructions (Signed)
Continue taking 1 tablet daily except 0.5 tablet on Monday and Friday.  Repeat INR in 3 weeks

## 2020-05-29 NOTE — Telephone Encounter (Signed)
Per pt feels about the same will continue to wear monitor as directed and niece is keeping track of B/P See other message as well .Adonis Housekeeper

## 2020-05-29 NOTE — Telephone Encounter (Signed)
Returned call to patient of Dr. Debara Pickett  She reports persistent shortness of breath, heaviness  She states her symptoms quiet down after metoprolol dose(s) She also uses PRN xanax She is wearing her monitor Advised will update Dr. Debara Pickett

## 2020-05-29 NOTE — Telephone Encounter (Signed)
Lm to call back ./cy 

## 2020-05-29 NOTE — Telephone Encounter (Signed)
Patient returning call.

## 2020-05-31 ENCOUNTER — Telehealth: Payer: Self-pay | Admitting: Internal Medicine

## 2020-05-31 NOTE — Telephone Encounter (Signed)
Patient c/o Palpitations:  High priority if patient c/o lightheadedness, shortness of breath, or chest pain  1) How long have you had palpitations/irregular HR/ Afib? Are you having the symptoms now? Palpitations, yes  2) Are you currently experiencing lightheadedness, SOB or CP? A little SOB and chest heaviness  3) Do you have a history of afib (atrial fibrillation) or irregular heart rhythm? palpitations  4) Have you checked your BP or HR? (document readings if available): Did not check BP and HR today, yesterday BP was 140's/60's  5) Are you experiencing any other symptoms? no

## 2020-05-31 NOTE — Telephone Encounter (Signed)
Spoke to patient she stated she has been having chest heaviness and sob.Stated she would like to be seen.She is wearing a 14 day monitor.Monitor to be mailed back 2/22.Appointment scheduled with Jory Sims DNP 2/18 at 3:15 pm.Advised to go to ED if needed.

## 2020-06-01 ENCOUNTER — Encounter: Payer: Self-pay | Admitting: Adult Health

## 2020-06-01 ENCOUNTER — Other Ambulatory Visit: Payer: Self-pay

## 2020-06-01 ENCOUNTER — Other Ambulatory Visit: Payer: Self-pay | Admitting: Internal Medicine

## 2020-06-01 ENCOUNTER — Ambulatory Visit (INDEPENDENT_AMBULATORY_CARE_PROVIDER_SITE_OTHER): Payer: Medicare HMO | Admitting: Adult Health

## 2020-06-01 VITALS — BP 128/66 | HR 73 | Ht 66.0 in | Wt 183.4 lb

## 2020-06-01 DIAGNOSIS — I2510 Atherosclerotic heart disease of native coronary artery without angina pectoris: Secondary | ICD-10-CM

## 2020-06-01 DIAGNOSIS — Z79899 Other long term (current) drug therapy: Secondary | ICD-10-CM

## 2020-06-01 DIAGNOSIS — E7800 Pure hypercholesterolemia, unspecified: Secondary | ICD-10-CM | POA: Diagnosis not present

## 2020-06-01 DIAGNOSIS — I480 Paroxysmal atrial fibrillation: Secondary | ICD-10-CM

## 2020-06-01 DIAGNOSIS — R079 Chest pain, unspecified: Secondary | ICD-10-CM

## 2020-06-01 DIAGNOSIS — I471 Supraventricular tachycardia: Secondary | ICD-10-CM

## 2020-06-01 DIAGNOSIS — I10 Essential (primary) hypertension: Secondary | ICD-10-CM

## 2020-06-01 NOTE — Patient Instructions (Signed)
Medication Instructions:   STOP Lomotil  TAKE Colace up to 3 times in a week  DO NOT TAKE Xanax and Tramadol at the same time  *If you need a refill on your cardiac medications before your next appointment, please call your pharmacy*  Lab Work: NONE ordered at this time of appointment   If you have labs (blood work) drawn today and your tests are completely normal, you will receive your results only by: [name] MyChart Message (if you have MyChart) OR . A paper copy in the mail If you have any lab test that is abnormal or we need to change your treatment, we will call you to review the results.  Testing/Procedures: NONE ordered at this time of appointment   Follow-Up: At [facility], you and your health needs are our priority.  As part of our continuing mission to provide you with exceptional heart care, we have created designated Provider Care Teams.  These Care Teams include your primary Cardiologist (physician) and Advanced Practice Providers (APPs -  Physician Assistants and Nurse Practitioners) who all work together to provide you with the care you need, when you need it.  We recommend signing up for the patient portal called "MyChart".  Sign up information is provided on this After Visit Summary.  MyChart is used to connect with patients for Virtual Visits (Telemedicine).  Patients are able to view lab/test results, encounter notes, upcoming appointments, etc.  Non-urgent messages can be sent to your provider as well.   To learn more about what you can do with MyChart, go to [email or web address].    Your next appointment:   Push upcome 06/12/20 appointment back 1-2 weeks    The format for your next appointment:   In Person  Provider:   Almyra Deforest, PA-C  Other Instructions

## 2020-06-01 NOTE — Progress Notes (Signed)
Cardiology Office Note   Date:  06/01/2020   ID:  [name], DOB 11/25/1934, MRN [id number]  PCP:  Cassandria Anger, MD  Cardiologist:  Dr. Debara Pickett  CC: Follow Up   History of Present Illness: [name] is a 86 y.o. female who presents for ongoing assessment and management of paroxysmal atrial fibrillation on warfarin (followed [facility] the [facility]), CHADS VASC Score of 4, hypertension, dyslipidemia, moderate non-obstructive CAD by cath 2012 , PSVT, and LVH with moderate TR by echo. Had COVID infection in 2022.   She called our office on 05/31/2020 with complaints of chest heaviness and shortness of breath. She has a cardiac monitor that has not finished its duration to be taken off on 06/05/2020.  On last visit with Dr. Debara Pickett on 05/17/2020 (15 days ago) he recommended stress test, cardiac CT due to her history of non-obstructive CAD if she began to have more chest discomfort.   She comes today with multiple complaints.  She continues to wear the for monitoring does not turn it in for another week.  She continues to complain of palpitations, constipation, weakness, fatigue, and anxiety.  She states when her heart rate goes up she feels very anxious and Xanax helps.  She is very fixated on having a bowel movement every day.  She takes Lomotil at night in order to make sure she does have a bowel movement she admits to not eating very much.  Someone else helps her with her medications and feels her medication dispenser.  She states that she feels the rapid heart rate a lot when she is in bed.  Past Medical History:  Diagnosis Date  . Anxiety state, unspecified   . Bronchitis, not specified as acute or chronic   . Diaphragmatic hernia without mention of obstruction or gangrene   . Dyslipidemia   . Essential hypertension   . Irritable bowel syndrome   . Mitral valve disorders(424.0)    a. 08/2010 Echo: EF >55%, mild MR, mod TR, trace AI.  [name] Non-obstructive CAD    a.  07/2010 Lexiscan MV: no ischemia, EF 78%; b. 07/2010 Cath: LM nl, LAD50/60p, 40 apical, LCX nl, RCA dominant, 30/40p, 58m.  . Osteoarthrosis, unspecified whether generalized or localized, unspecified site   . Osteoporosis, unspecified   . Other abnormal glucose   . PAF (paroxysmal atrial fibrillation) (HCC)    a. CHA2DS2VASc = 4-->coumadin.  [name] Phlebitis and thrombophlebitis of superficial vessels of lower extremities   . PSVT (paroxysmal supraventricular tachycardia) ([facility])    a. 05/2012 Holter: short bursts of PSVT noted-->Managed with toprol.  [name] Unspecified venous (peripheral) insufficiency   . Unspecified vitamin D deficiency     Past Surgical History:  Procedure Laterality Date  . CARDIAC CATHETERIZATION  07/30/2010   nonobstructive CAD, 20-40% RCA stenosis, 50-60% eccentric LAD stenosis more prox, 40% LAD stenosis more distal, EF 65%  . TRANSTHORACIC ECHOCARDIOGRAM  09/03/2010   EF=>55%; mild MR; mod TR; AV mildly sclerotic with trace regurg; mild pulm valve regurg  . VESICOVAGINAL FISTULA CLOSURE W/ TAH       Current Outpatient Medications  Medication Sig Dispense Refill  . ACCU-CHEK FASTCLIX LANCETS MISC Use to check blood sugars twice a day Dx E11.9 (Patient taking differently: 1 each by Other route in the morning and at bedtime.) 102 each 2  . Alcohol Swabs (B-D SINGLE USE SWABS REGULAR) PADS Use to clean injection site to check blood sugars daily (Patient taking differently: 1 each by  Other route See admin instructions. Use to clean injection site to check blood sugars daily) 300 each 3  . ALPRAZolam (XANAX) 0.5 MG tablet TAKE 1/2 TO 1 TABLET BY MOUTH THREE TIMES DAILY AS NEEDED FOR ANXIETY. DO NOT TAKE WITH TRAMADOL.THIS IS A 30 DAY SUPPLY (Patient taking differently: Take 0.25-0.5 mg by mouth 3 (three) times daily as needed for anxiety (DO NOT TAKE WITH TRAMADOL).) 90 tablet 3  . amLODipine (NORVASC) 5 MG tablet Take 1 tablet (5 mg total) by mouth daily. 90 tablet 3  . aspirin 81  MG tablet Take 81 mg by mouth daily.    . Cholecalciferol (VITAMIN D3) 50 MCG (2000 UT) capsule Take 1 capsule (2,000 Units total) by mouth daily. 100 capsule 3  . clidinium-chlordiazePOXIDE (LIBRAX) 5-2.5 MG capsule TAKE 1 CAPSULE BY MOUTH THREE TIMES DAILY AS NEEDED FOR CRAMPS 60 capsule 1  . diazepam (VALIUM) 5 MG tablet Take by mouth.    . diclofenac Sodium (VOLTAREN) 1 % GEL Apply 2 g topically 4 (four) times daily. 150 g 1  . furosemide (LASIX) 20 MG tablet TAKE 1 TABLET(20 MG) BY MOUTH DAILY AS NEEDED FOR SWELLING (Patient taking differently: Take 20 mg by mouth daily as needed for edema (swelling).) 30 tablet 3  . glucose blood (ACCU-CHEK AVIVA PLUS) test strip Use to check blood sugars twice a day (Patient taking differently: 1 each by Other route in the morning and at bedtime.) 100 each 2  . hydroxypropyl methylcellulose (ISOPTO TEARS) 2.5 % ophthalmic solution Place 1 drop into both eyes 2 (two) times daily.    . hydrOXYzine (ATARAX/VISTARIL) 25 MG tablet Take 1 tablet (25 mg total) by mouth every 8 (eight) hours as needed for itching. 60 tablet 0  . isosorbide mononitrate (IMDUR) 30 MG 24 hr tablet TAKE 1 TABLET BY MOUTH EVERY DAY (Patient taking differently: Take 30 mg by mouth daily.) 30 tablet 11  . Lancets (ACCU-CHEK SOFT TOUCH) lancets Use as instructed (Patient taking differently: 1 each by Other route as directed.) 300 each 3  . metoprolol succinate (TOPROL-XL) 50 MG 24 hr tablet Take 1.5 tablets (75 mg total) by mouth in the morning and at bedtime. Take with or immediately following a meal. 270 tablet 3  . ondansetron (ZOFRAN ODT) 4 MG disintegrating tablet Take 1 tablet (4 mg total) by mouth every 8 (eight) hours as needed for up to 10 doses for nausea or vomiting. 10 tablet 0  . ondansetron (ZOFRAN) 4 MG tablet Take 1 tablet (4 mg total) by mouth every 8 (eight) hours as needed for nausea or vomiting. 20 tablet 0  . Oral Electrolytes (PEDIALYTE) PACK As directed for dehydration  (Patient taking differently: 1 each by Other route See admin instructions. As directed for dehydration) 6 each 1  . pantoprazole (PROTONIX) 40 MG tablet TAKE 1 TABLET BY MOUTH EVERY DAY 90 tablet 3  . simvastatin (ZOCOR) 20 MG tablet TAKE 1 TABLET BY MOUTH AT BEDTIME (Patient taking differently: Take 20 mg by mouth at bedtime.) 90 tablet 1  . traMADol (ULTRAM) 50 MG tablet Take 1 tablet (50 mg total) by mouth 3 (three) times daily. 90 tablet 3  . warfarin (COUMADIN) 5 MG tablet TAKE 1 TABLET BY MOUTH EVERY DAY AS DIRECTED BY COUMADIN CLINIC (Patient taking differently: Take 5 mg by mouth See admin instructions. TAKE 1 TABLET BY MOUTH EVERY DAY AS DIRECTED BY COUMADIN CLINIC) 90 tablet 0   No current facility-administered medications for this visit.  Allergies:   Clarithromycin, Fosamax [alendronate sodium], Guaifenesin er, Metformin, Prandin [repaglinide], and Tape    Social History:  The patient  reports that she has never smoked. She has never used smokeless tobacco. She reports that she does not drink alcohol and does not use drugs.   Family History:  The patient's family history includes Clotting disorder in her paternal grandmother; Stroke in her brother, father, and mother.    ROS: All other systems are reviewed and negative. Unless otherwise mentioned in H&P    PHYSICAL EXAM: VS:  BP 128/66   Pulse 73   Ht 5\' 6"  (1.676 m)   Wt 183 lb 6.4 oz (83.2 kg)   SpO2 98%   BMI 29.60 kg/m  , BMI Body mass index is 29.6 kg/m. GEN: Well nourished, well developed, in no acute distress HEENT: normal Neck: no JVD, carotid bruits, or masses Cardiac: IRRR; no murmurs, rubs, or gallops,no edema  Respiratory:  Clear to auscultation bilaterally, normal work of breathing GI: soft, nontender, nondistended, + BS MS: no deformity or atrophy Skin: warm and dry, no rash Neuro:  Strength and sensation are intact Psych: euthymic mood, full affect   EKG: Sinus rhythm with marked sinus arrhythmia,  PACs, first-degree AV block with a PR interval of 216 ms.  Ventricular rate of 72 bpm.  Recent Labs: 05/08/2020: ALT 20 05/20/2020: B Natriuretic Peptide 78.9 05/24/2020: BUN 8; Creatinine, Ser 0.82; Hemoglobin 11.3; Magnesium 2.1; Platelets 234; Potassium 3.4; Sodium 133; TSH 0.856    Lipid Panel    Component Value Date/Time   CHOL 176 11/18/2018 1024   CHOL 160 06/09/2018 1046   TRIG 75.0 11/18/2018 1024   HDL 72.20 11/18/2018 1024   HDL 72 06/09/2018 1046   CHOLHDL 2 11/18/2018 1024   VLDL 15.0 11/18/2018 1024   LDLCALC 89 11/18/2018 1024   LDLCALC 74 06/09/2018 1046   LDLDIRECT 105.5 11/26/2010 0905      Wt Readings from Last 3 Encounters:  06/01/20 183 lb 6.4 oz (83.2 kg)  05/24/20 180 lb (81.6 kg)  05/20/20 184 lb (83.5 kg)      Other studies Reviewed: Echocardiogram May 28, 2016  Left ventricle: The cavity size was normal. There was mild focal  basal hypertrophy of the septum. Systolic function was normal.  The estimated ejection fraction was in the range of 60% to 65%.  Wall motion was normal; there were no regional wall motion  abnormalities. Doppler parameters are consistent with abnormal  left ventricular relaxation (grade 1 diastolic dysfunction).  - Pulmonary arteries: Systolic pressure was mildly increased. PA  peak pressure: 34 mm Hg (S).    ASSESSMENT AND PLAN:  1.  CAD: Moderate nonobstructive.  The patient continues to have discomfort and palpitations.  I reviewed her medications.  She has been taking large doses of Lomotil, which does contain atropine, to avoid constipation.  Of asked her to stop this.  She will continue to wear the cardiac monitor until 06/05/2020.  We will download her information and be able to discuss this further with her on follow-up.  Once monitor is removed can consider stress test on this patient at Dr. Lysbeth Penner discretion.  In the interim, she will continue on guideline directed secondary prevention with statin,  beta-blocker, and blood pressure control.  2.  PSVT: As above we will review her cardiac monitor for further evaluation of heart arrhythmias and recurrence of PSVT.  In the interim I have stopped the Lomotil.  She is to use a stool softener instead.  3.  Anxiety: Office feels anxiety with rapid heart rhythm and palpitations.  She will sometimes take both the Xanax and tramadol for symptom relief.  Other I have asked her not to combine these 2 medications to avoid dizziness and falls especially on Coumadin.  4.  Atrial fibrillation: She will continue on Coumadin as directed and follow-up of our Coumadin clinic.  Heart rate is in the 80s at this point.  Would like to see how she does also Lomotil which she takes up to 3 times a day.  No issues with bleeding.  Would stop aspirin.  5.  Hypertension: Blood pressures currently well controlled on amlodipine, isosorbide mononitrate.  No changes.  6.  Polypharmacy: I am concerned a number of medications which may be causing some of her symptoms.  It may be beneficial for her to meet with the pharmacist to have them evaluate her current medication regimen.  Her niece who is with her will go into her medication dispenser and remove the Lomotil as well as the Xanax and tramadol if they are on a daily regimen in her box.  Current medicines are reviewed at length with the patient today.  I have spent 45 min's  dedicated to the care of this patient on the date of this encounter to include pre-visit review of records, assessment, management and diagnostic testing,with shared decision making.  Labs/ tests ordered today include: None Phill Myron. West Pugh, ANP, AACC   06/01/2020 5:06 PM    [facility] [address] Office [phone] Fax [phone]  Notice: This dictation was prepared with Dragon dictation along with smaller phrase technology. Any transcriptional errors that result from this process are unintentional  and may not be corrected upon review.

## 2020-06-12 ENCOUNTER — Ambulatory Visit: Payer: Medicare HMO | Admitting: Physician Assistant

## 2020-06-12 ENCOUNTER — Other Ambulatory Visit: Payer: Self-pay

## 2020-06-12 ENCOUNTER — Ambulatory Visit (INDEPENDENT_AMBULATORY_CARE_PROVIDER_SITE_OTHER): Payer: Medicare HMO | Admitting: Pharmacist

## 2020-06-12 DIAGNOSIS — I480 Paroxysmal atrial fibrillation: Secondary | ICD-10-CM

## 2020-06-12 DIAGNOSIS — Z7901 Long term (current) use of anticoagulants: Secondary | ICD-10-CM | POA: Diagnosis not present

## 2020-06-12 LAB — POCT INR: INR: 1.8 — AB (ref 2.0–3.0)

## 2020-06-15 ENCOUNTER — Other Ambulatory Visit: Payer: Self-pay | Admitting: *Deleted

## 2020-06-15 DIAGNOSIS — I480 Paroxysmal atrial fibrillation: Secondary | ICD-10-CM

## 2020-06-15 MED ORDER — METOPROLOL SUCCINATE ER 100 MG PO TB24: 100.0000 mg | ORAL_TABLET | Freq: Two times a day (BID) | ORAL | 3 refills | Status: DC

## 2020-06-19 ENCOUNTER — Other Ambulatory Visit: Payer: Self-pay | Admitting: Internal Medicine

## 2020-06-19 ENCOUNTER — Telehealth: Payer: Self-pay | Admitting: Internal Medicine

## 2020-06-19 DIAGNOSIS — I480 Paroxysmal atrial fibrillation: Secondary | ICD-10-CM

## 2020-06-19 MED ORDER — METOPROLOL SUCCINATE ER 50 MG PO TB24: ORAL_TABLET | ORAL | 6 refills | Status: DC

## 2020-06-19 NOTE — Telephone Encounter (Signed)
Pt c/o medication issue:  1. Name of Medication: metoprolol succinate (TOPROL-XL) 100 MG 24 hr tablet [[id number]   2. How are you currently taking this medication (dosage and times per day)? Take 1 tablet (100 mg total) by mouth in the morning and at bedtime. Take with or immediately following a meal.   3. Are you having a reaction (difficulty breathing--STAT)? Na   4. What is your medication issue? Pt called in and stated that she thinks this dose is to strong for her.  She feel like the dose increase makes her heart flutter more .    Best number - [phone]

## 2020-06-19 NOTE — Telephone Encounter (Signed)
Spoke to patient she wanted Dr.Hilty to know she stopped taking Metoprolol Succ 100 mg twice a day.Stated she felt like it was too strong.Stated it caused heart to flutter more.She went back to Metoprolol Succ 50 mg 1&1/2 tablets twice a day and feels better.She will keep appointment with Almyra Deforest PA 3/16 at 2:15 pm.I will send message to Dr.Hilty.

## 2020-06-20 NOTE — Telephone Encounter (Signed)
Ok thanks for letting me know  Dr Lemmie Evens

## 2020-06-22 DIAGNOSIS — R000 Tachycardia, unspecified: Secondary | ICD-10-CM | POA: Diagnosis not present

## 2020-06-23 ENCOUNTER — Encounter (HOSPITAL_COMMUNITY): Payer: Self-pay | Admitting: Emergency Medicine

## 2020-06-23 ENCOUNTER — Emergency Department (HOSPITAL_COMMUNITY)
Admission: EM | Admit: 2020-06-23 | Discharge: 2020-06-23 | Disposition: A | Discharge status: Home / Self Care | Payer: Medicare HMO | Attending: Emergency Medicine | Admitting: Emergency Medicine

## 2020-06-23 ENCOUNTER — Other Ambulatory Visit: Payer: Self-pay

## 2020-06-23 ENCOUNTER — Emergency Department (HOSPITAL_COMMUNITY): Payer: Medicare HMO

## 2020-06-23 DIAGNOSIS — I2510 Atherosclerotic heart disease of native coronary artery without angina pectoris: Secondary | ICD-10-CM | POA: Insufficient documentation

## 2020-06-23 DIAGNOSIS — I10 Essential (primary) hypertension: Secondary | ICD-10-CM | POA: Diagnosis not present

## 2020-06-23 DIAGNOSIS — E875 Hyperkalemia: Secondary | ICD-10-CM | POA: Diagnosis not present

## 2020-06-23 DIAGNOSIS — R000 Tachycardia, unspecified: Secondary | ICD-10-CM | POA: Diagnosis not present

## 2020-06-23 DIAGNOSIS — F419 Anxiety disorder, unspecified: Secondary | ICD-10-CM | POA: Diagnosis not present

## 2020-06-23 DIAGNOSIS — E119 Type 2 diabetes mellitus without complications: Secondary | ICD-10-CM | POA: Diagnosis not present

## 2020-06-23 DIAGNOSIS — Z7901 Long term (current) use of anticoagulants: Secondary | ICD-10-CM | POA: Insufficient documentation

## 2020-06-23 DIAGNOSIS — R002 Palpitations: Secondary | ICD-10-CM | POA: Diagnosis not present

## 2020-06-23 DIAGNOSIS — I158 Other secondary hypertension: Secondary | ICD-10-CM | POA: Diagnosis not present

## 2020-06-23 DIAGNOSIS — E876 Hypokalemia: Secondary | ICD-10-CM | POA: Diagnosis not present

## 2020-06-23 DIAGNOSIS — Z79899 Other long term (current) drug therapy: Secondary | ICD-10-CM | POA: Insufficient documentation

## 2020-06-23 DIAGNOSIS — R197 Diarrhea, unspecified: Secondary | ICD-10-CM | POA: Diagnosis not present

## 2020-06-23 DIAGNOSIS — Z7982 Long term (current) use of aspirin: Secondary | ICD-10-CM | POA: Insufficient documentation

## 2020-06-23 DIAGNOSIS — R112 Nausea with vomiting, unspecified: Secondary | ICD-10-CM | POA: Diagnosis not present

## 2020-06-23 LAB — PROTIME-INR
INR: 1.3 — ABNORMAL HIGH (ref 0.8–1.2)
Prothrombin Time: 15.6 seconds — ABNORMAL HIGH (ref 11.4–15.2)

## 2020-06-23 LAB — BASIC METABOLIC PANEL
Anion gap: 10 (ref 5–15)
BUN: 12 mg/dL (ref 8–23)
CO2: 28 mmol/L (ref 22–32)
Calcium: 9.6 mg/dL (ref 8.9–10.3)
Chloride: 99 mmol/L (ref 98–111)
Creatinine, Ser: 0.88 mg/dL (ref 0.44–1.00)
GFR, Estimated: >60 mL/min (ref >60)
Glucose, Bld: 169 mg/dL — ABNORMAL HIGH (ref 70–99)
Potassium: 3.4 mmol/L — ABNORMAL LOW (ref 3.5–5.1)
Sodium: 137 mmol/L (ref 135–145)

## 2020-06-23 LAB — CBC
HCT: 38.6 % (ref 36.0–46.0)
Hemoglobin: 12.3 g/dL (ref 12.0–15.0)
MCH: 29 pg (ref 26.0–34.0)
MCHC: 31.9 g/dL (ref 30.0–36.0)
MCV: 91 fL (ref 80.0–100.0)
Platelets: 243 10*3/uL (ref 150–400)
RBC: 4.24 MIL/uL (ref 3.87–5.11)
RDW: 13.2 % (ref 11.5–15.5)
WBC: 6.4 10*3/uL (ref 4.0–10.5)
nRBC: 0 % (ref 0.0–0.2)

## 2020-06-23 LAB — MAGNESIUM: Magnesium: 2 mg/dL (ref 1.7–2.4)

## 2020-06-23 MED ORDER — AMLODIPINE BESYLATE 5 MG PO TABS
5.0000 mg | ORAL_TABLET | Freq: Once | ORAL | Status: AC
  Administered 2020-06-23: 5 mg via ORAL
  Filled 2020-06-23: qty 1

## 2020-06-23 NOTE — ED Triage Notes (Signed)
Patient here from home reporting ongoing heart palpitations and hypertension. States that home monitor was reading 180s/190s/ over 100s. States that she still takes coumadin.

## 2020-06-23 NOTE — ED Provider Notes (Addendum)
[facility] DEPT Provider Note  CSN: [id number] Arrival date & time: 06/23/20 0024  Chief Complaint(s) Palpitations and Hypertension  HPI [name] is a 86 y.o. female with a past medical history listed below who presents to the emergency department with elevated blood pressures resulting in mild exacerbation of her anxiety.  Patient also endorsing palpitations.  Reports that she is been taking her medications as prescribed.  Reports that she noted her blood pressures with systolics in the 585I on numerous occasions tonight.  Denies any associated chest pain or shortness of breath.  No recent fevers or infections.  No coughing or congestion.  No other physical complaints.  Reports that she only takes her furosemide as needed.  HPI  Past Medical History Past Medical History:  Diagnosis Date  . Anxiety state, unspecified   . Bronchitis, not specified as acute or chronic   . Diaphragmatic hernia without mention of obstruction or gangrene   . Dyslipidemia   . Essential hypertension   . Irritable bowel syndrome   . Mitral valve disorders(424.0)    a. 08/2010 Echo: EF >55%, mild MR, mod TR, trace AI.  [name] Non-obstructive CAD    a. 07/2010 Lexiscan MV: no ischemia, EF 78%; b. 07/2010 Cath: LM nl, LAD50/60p, 40 apical, LCX nl, RCA dominant, 30/40p, 1m.  . Osteoarthrosis, unspecified whether generalized or localized, unspecified site   . Osteoporosis, unspecified   . Other abnormal glucose   . PAF (paroxysmal atrial fibrillation) (HCC)    a. CHA2DS2VASc = 4-->coumadin.  [name] Phlebitis and thrombophlebitis of superficial vessels of lower extremities   . PSVT (paroxysmal supraventricular tachycardia) ([place])    a. 05/2012 Holter: short bursts of PSVT noted-->Managed with toprol.  [name] Unspecified venous (peripheral) insufficiency   . Unspecified vitamin D deficiency    Patient Active Problem List   Diagnosis Date Noted  . Gastroenteritis 04/27/2020  .  Dehydration 04/27/2020  . Urinary frequency 02/28/2020  . Peroneal tendinitis 11/01/2019  . Spontaneous rupture of extensor tendon of left foot 10/28/2019  . Contusion of left foot 10/27/2019  . Sinusitis 04/13/2019  . Rash 12/05/2016  . Dyspnea 04/24/2016  . Tricuspid valve insufficiency 04/24/2016  . Cerumen impaction 11/19/2015  . Ear discomfort 11/13/2015  . Grief at loss of child 09/14/2015  . Well adult exam 09/14/2015  . Dyslipidemia   . PSVT (paroxysmal supraventricular tachycardia) ([place])   . PAF (paroxysmal atrial fibrillation) ([place])   . Viral gastroenteritis 07/21/2015  . Acute upper respiratory infection 04/17/2015  . Edema 11/08/2014  . Chronic venous insufficiency 11/08/2014  . Diarrhea 05/01/2014  . Mass of left thigh 12/14/2013  . Microhematuria 12/14/2013  . Long term (current) use of anticoagulants 08/19/2013  . CAD (coronary artery disease) 05/21/2011  . Atrial tachycardia ([employer]) 11/19/2010  . CHEST PAIN 08/07/2009  . Vitamin D deficiency 04/19/2008  . SUPERFICIAL THROMBOPHLEBITIS 10/20/2007  . DM2 (diabetes mellitus, type 2) ([place]) 03/16/2007  . HYPERCHOLESTEROLEMIA 03/15/2007  . Anxiety 03/15/2007  . Essential hypertension 03/15/2007  . Mitral valve disorder 03/15/2007  . BRONCHITIS 03/15/2007  . HIATAL HERNIA 03/15/2007  . Irritable bowel syndrome 03/15/2007  . Osteoarthritis 03/15/2007  . Osteoporosis 03/15/2007   Home Medication(s) Prior to Admission medications   Medication Sig Start Date End Date Taking? Authorizing Provider  warfarin (COUMADIN) 5 MG tablet TAKE 1 TABLET BY MOUTH EVERY DAY AS DIRECTED BY COUMADIN CLINIC 06/19/20   Hilty, Nadean Corwin, MD  ACCU-CHEK FASTCLIX LANCETS MISC Use to check blood sugars twice  a day Dx E11.9 Patient taking differently: 1 each by Other route in the morning and at bedtime. 07/18/15   Plotnikov, Evie Lacks, MD  Alcohol Swabs (B-D SINGLE USE SWABS REGULAR) PADS Use to clean injection site to check blood sugars  daily Patient taking differently: 1 each by Other route See admin instructions. Use to clean injection site to check blood sugars daily 05/16/19   Plotnikov, Evie Lacks, MD  ALPRAZolam (XANAX) 0.5 MG tablet TAKE 1/2 TO 1 TABLET BY MOUTH THREE TIMES DAILY AS NEEDED FOR ANXIETY. DO NOT TAKE WITH TRAMADOL.THIS IS A 30 DAY SUPPLY Patient taking differently: Take 0.25-0.5 mg by mouth 3 (three) times daily as needed for anxiety (DO NOT TAKE WITH TRAMADOL). 12/20/19   Plotnikov, Evie Lacks, MD  amLODipine (NORVASC) 5 MG tablet TAKE 1 TABLET(5 MG) BY MOUTH DAILY 06/19/20   Plotnikov, Evie Lacks, MD  aspirin 81 MG tablet Take 81 mg by mouth daily.    [provider]  Cholecalciferol (VITAMIN D3) 50 MCG (2000 UT) capsule Take 1 capsule (2,000 Units total) by mouth daily. 08/02/19   Plotnikov, Evie Lacks, MD  clidinium-chlordiazePOXIDE (LIBRAX) 5-2.5 MG capsule TAKE 1 CAPSULE BY MOUTH THREE TIMES DAILY AS NEEDED FOR CRAMPS 05/22/20   Plotnikov, Evie Lacks, MD  diazepam (VALIUM) 5 MG tablet Take by mouth. 08/31/19   [provider]  diclofenac Sodium (VOLTAREN) 1 % GEL Apply 2 g topically 4 (four) times daily. 11/01/19   Aundra Dubin, PA-C  furosemide (LASIX) 20 MG tablet TAKE 1 TABLET(20 MG) BY MOUTH DAILY AS NEEDED FOR SWELLING Patient taking differently: Take 20 mg by mouth daily as needed for edema (swelling). 02/22/20   Hilty, Nadean Corwin, MD  glucose blood (ACCU-CHEK AVIVA PLUS) test strip Use to check blood sugars twice a day Patient taking differently: 1 each by Other route in the morning and at bedtime. 07/15/18   Plotnikov, Evie Lacks, MD  hydroxypropyl methylcellulose (ISOPTO TEARS) 2.5 % ophthalmic solution Place 1 drop into both eyes 2 (two) times daily.    [provider]  hydrOXYzine (ATARAX/VISTARIL) 25 MG tablet Take 1 tablet (25 mg total) by mouth every 8 (eight) hours as needed for itching. 01/19/19   Plotnikov, Evie Lacks, MD  isosorbide mononitrate (IMDUR) 30 MG 24 hr tablet TAKE 1  TABLET BY MOUTH EVERY DAY Patient taking differently: Take 30 mg by mouth daily. 07/05/19   Hilty, Nadean Corwin, MD  Lancets (ACCU-CHEK SOFT TOUCH) lancets Use as instructed Patient taking differently: 1 each by Other route as directed. 05/16/19   Plotnikov, Evie Lacks, MD  metoprolol succinate (TOPROL-XL) 50 MG 24 hr tablet Takes 1&1/2 tablets twice a day 06/19/20   Hilty, Nadean Corwin, MD  ondansetron (ZOFRAN ODT) 4 MG disintegrating tablet Take 1 tablet (4 mg total) by mouth every 8 (eight) hours as needed for up to 10 doses for nausea or vomiting. 05/01/20   Breck Coons, MD  ondansetron (ZOFRAN) 4 MG tablet Take 1 tablet (4 mg total) by mouth every 8 (eight) hours as needed for nausea or vomiting. 04/27/20   Plotnikov, Evie Lacks, MD  Oral Electrolytes (PEDIALYTE) PACK As directed for dehydration Patient taking differently: 1 each by Other route See admin instructions. As directed for dehydration 04/27/20   Plotnikov, Evie Lacks, MD  pantoprazole (PROTONIX) 40 MG tablet TAKE 1 TABLET BY MOUTH EVERY DAY 06/01/20   Hilty, Nadean Corwin, MD  simvastatin (ZOCOR) 20 MG tablet TAKE 1 TABLET BY MOUTH AT BEDTIME Patient taking  differently: Take 20 mg by mouth at bedtime. 02/16/20   Plotnikov, Evie Lacks, MD  traMADol (ULTRAM) 50 MG tablet Take 1 tablet (50 mg total) by mouth 3 (three) times daily. 09/21/18   Plotnikov, Evie Lacks, MD                                                                                                                                    Past Surgical History Past Surgical History:  Procedure Laterality Date  . CARDIAC CATHETERIZATION  07/30/2010   nonobstructive CAD, 20-40% RCA stenosis, 50-60% eccentric LAD stenosis more prox, 40% LAD stenosis more distal, EF 65%  . TRANSTHORACIC ECHOCARDIOGRAM  09/03/2010   EF=>55%; mild MR; mod TR; AV mildly sclerotic with trace regurg; mild pulm valve regurg  . VESICOVAGINAL FISTULA CLOSURE W/ TAH     Family History Family History  Problem Relation Age of  Onset  . Stroke Mother   . Stroke Father        also heart disease  . Clotting disorder Paternal Grandmother        blood clot  . Stroke Brother        also heart disease    Social History Social History   Tobacco Use  . Smoking status: Never Smoker  . Smokeless tobacco: Never Used  Vaping Use  . Vaping Use: Never used  Substance Use Topics  . Alcohol use: No  . Drug use: No   Allergies Clarithromycin, Fosamax [alendronate sodium], Guaifenesin er, Metformin, Prandin [repaglinide], and Tape  Review of Systems Review of Systems All other systems are reviewed and are negative for acute change except as noted in the HPI  Physical Exam Vital Signs  I have reviewed the triage vital signs BP (!) 132/107   Pulse 81   Temp 98.1 F (36.7 C) (Oral)   Resp (!) 26   SpO2 100%   Physical Exam Vitals reviewed.  Constitutional:      General: She is not in acute distress.    Appearance: She is well-developed. She is not diaphoretic.  HENT:     Head: Normocephalic and atraumatic.     Nose: Nose normal.  Eyes:     General: No scleral icterus.       Right eye: No discharge.        Left eye: No discharge.     Conjunctiva/sclera: Conjunctivae normal.     Pupils: Pupils are equal, round, and reactive to light.  Cardiovascular:     Rate and Rhythm: Normal rate and regular rhythm.     Heart sounds: No murmur heard. No friction rub. No gallop.   Pulmonary:     Effort: Pulmonary effort is normal. No respiratory distress.     Breath sounds: Normal breath sounds. No stridor. No rales.  Abdominal:     General: There is no distension.     Palpations: Abdomen is soft.     Tenderness:  There is no abdominal tenderness.  Musculoskeletal:        General: No tenderness.     Cervical back: Normal range of motion and neck supple.  Skin:    General: Skin is warm and dry.     Findings: No erythema or rash.  Neurological:     Mental Status: She is alert and oriented to person, place, and  time.     ED Results and Treatments Labs (all labs ordered are listed, but only abnormal results are displayed) Labs Reviewed  BASIC METABOLIC PANEL - Abnormal; Notable for the following components:      Result Value   Potassium 3.4 (*)    Glucose, Bld 169 (*)    All other components within normal limits  PROTIME-INR - Abnormal; Notable for the following components:   Prothrombin Time 15.6 (*)    INR 1.3 (*)    All other components within normal limits  CBC  MAGNESIUM                                                                                                                         EKG  EKG Interpretation  Date/Time:  Saturday June 23 2020 00:41:21 EST Ventricular Rate:  67 PR Interval:    QRS Duration: 94 QT Interval:  409 QTC Calculation: 432 R Axis:   -31 Text Interpretation: Sinus rhythm Atrial premature complex Left ventricular hypertrophy Otherwise no significant change Confirmed by Addison Lank [phone]) on 06/23/2020 1:52:44 AM      Radiology DG Chest 2 View  Result Date: 06/23/2020 CLINICAL DATA:  Palpitations, hypertension EXAM: CHEST - 2 VIEW COMPARISON:  05/24/2020 FINDINGS: The heart size and mediastinal contours are within normal limits. Both lungs are clear. The visualized skeletal structures are unremarkable. IMPRESSION: No active cardiopulmonary disease. Electronically Signed   By: Randa Ngo M.D.   On: 06/23/2020 01:18    Pertinent labs & imaging results that were available during my care of the patient were reviewed by me and considered in my medical decision making (see chart for details).  Medications Ordered in ED Medications  amLODipine (NORVASC) tablet 5 mg (5 mg Oral Given 06/23/20 0206)                                                                                                                                    Procedures .1-3 Lead EKG Interpretation Performed by: Leonette Monarch,  Grayce Sessions, MD Authorized by: Fatima Blank, MD      Interpretation: normal     ECG rate:  82   ECG rate assessment: normal     Rhythm: sinus rhythm     Ectopy: none     Conduction: normal      (including critical care time)  Medical Decision Making / ED Course I have reviewed the nursing notes for this encounter and the patient's prior records (if available in EHR or on provided paperwork).   [name] was evaluated in Emergency Department on 06/23/2020 for the symptoms described in the history of present illness. She was evaluated in the context of the global COVID-19 pandemic, which necessitated consideration that the patient might be at risk for infection with the SARS-CoV-2 virus that causes COVID-19. Institutional protocols and algorithms that pertain to the evaluation of patients at risk for COVID-19 are in a state of rapid change based on information released by regulatory bodies including the CDC and federal and state organizations. These policies and algorithms were followed during the patient's care in the ED.  Patient presents with elevated blood pressures and palpitations with mild anxiety. No associated chest pain or shortness of breath. EKG without acute ischemic changes.  PACs noted.  No other dysrhythmias or blocks. Labs notable for mild hypokalemia which is close to her baseline.  Rest of the labs are reassuring.  Patient provided with an extra dose of amlodipine resulting in downtrend of her blood pressures.      Final Clinical Impression(s) / ED Diagnoses Final diagnoses:  Hypokalemia  Palpitations  Other secondary hypertension    The patient appears reasonably screened and/or stabilized for discharge and I doubt any other medical condition or other [facility] requiring further screening, evaluation, or treatment in the ED at this time prior to discharge. Safe for discharge with strict return precautions.  Disposition: Discharge  Condition: Good  I have discussed the results, Dx and Tx plan with the  patient/family who expressed understanding and agree(s) with the plan. Discharge instructions discussed at length. The patient/family was given strict return precautions who verbalized understanding of the instructions. No further questions at time of discharge.    ED Discharge Orders    None       Follow Up: Plotnikov, Evie Lacks, MD [place] [address] [phone]  Call  to schedule an appointment for close follow up     This chart was dictated using voice recognition software.  Despite best efforts to proofread,  errors can occur which can change the documentation meaning.     Fatima Blank, MD 06/23/20 [phone]

## 2020-06-27 ENCOUNTER — Ambulatory Visit: Payer: Medicare HMO | Admitting: Physician Assistant

## 2020-06-27 ENCOUNTER — Ambulatory Visit (INDEPENDENT_AMBULATORY_CARE_PROVIDER_SITE_OTHER): Payer: Medicare HMO

## 2020-06-27 ENCOUNTER — Other Ambulatory Visit: Payer: Self-pay

## 2020-06-27 ENCOUNTER — Encounter: Payer: Self-pay | Admitting: Physician Assistant

## 2020-06-27 VITALS — BP 118/70 | HR 66 | Ht 66.0 in | Wt 186.0 lb

## 2020-06-27 DIAGNOSIS — U071 COVID-19: Secondary | ICD-10-CM

## 2020-06-27 DIAGNOSIS — E119 Type 2 diabetes mellitus without complications: Secondary | ICD-10-CM

## 2020-06-27 DIAGNOSIS — I10 Essential (primary) hypertension: Secondary | ICD-10-CM | POA: Diagnosis not present

## 2020-06-27 DIAGNOSIS — I2510 Atherosclerotic heart disease of native coronary artery without angina pectoris: Secondary | ICD-10-CM

## 2020-06-27 DIAGNOSIS — I480 Paroxysmal atrial fibrillation: Secondary | ICD-10-CM

## 2020-06-27 DIAGNOSIS — Z7901 Long term (current) use of anticoagulants: Secondary | ICD-10-CM | POA: Diagnosis not present

## 2020-06-27 DIAGNOSIS — R002 Palpitations: Secondary | ICD-10-CM

## 2020-06-27 DIAGNOSIS — Z79899 Other long term (current) drug therapy: Secondary | ICD-10-CM

## 2020-06-27 DIAGNOSIS — E785 Hyperlipidemia, unspecified: Secondary | ICD-10-CM | POA: Diagnosis not present

## 2020-06-27 LAB — POCT INR: INR: 1.8 — AB (ref 2.0–3.0)

## 2020-06-27 NOTE — Progress Notes (Signed)
Cardiology Office Note:    Date:  06/29/2020   ID:  [name], DOB [birth year], MRN [id number]  PCP:  Kelly Anger, MD   [place]  Cardiologist:  Kelly Casino, MD  Advanced Practice Provider:  No care team member to display Electrophysiologist:  None   Referring MD: Kelly Anger, MD   Chief Complaint  Patient presents with  . Follow-up    1 month.    History of Present Illness:    [name] is a 86 y.o. female with a hx of PAF, SVT, HTN, HLD, DM 2, venous insufficiency and history of nonobstructive CAD.  She underwent Myoview for chest pain in April 2012, this revealed EF 78%, no ischemia.  Due to persistent symptoms, she eventually underwent cardiac catheterization in April 2012 that showed normal left main, 50 to 60% LAD disease, 40% apical lesion, normal left circumflex, 30 to 40% proximal RCA lesion.  Echocardiogram at the time showed EF greater than 55%, mild aortic insufficiency and an aneurysmal atrial septum.  Heart monitor obtained in 2020 showed low A. fib burden.  More recently, she has been tested positive for Covid 19 on 05/01/2020.  Since discharge, he has went back to the emergency room twice on 1/22 and 1/25 due to increased palpitation, EKG obtained all showed sinus rhythm.  I last saw the patient in January for follow-up, at which time she complained of increased palpitation.  I recommended continued observation and follow-up in 1 month to reassess the degree of palpitation, if her palpitations continue to worsen, I was going to increase the metoprolol and proceeded with repeat heart monitor.  Unfortunately, as needed extra dose of Toprol-XL did not help her symptoms.  Dr. Debara Wiggins increase her Toprol-XL to 75 mg twice a day and placed her on 2-week heart monitor.  Heart monitor does not show any atrial fibrillation however it does show multiple episodes of what appears to be SVT.  Toprol-XL was further increased to 100 mg  twice a day, however she was unable to tolerate this higher dose, therefore she self reduced it down to 75 mg twice a day.  Patient presents today for follow-up.  She says since she has reduced Toprol-XL down to 75 mg twice a day, she still have palpitation but not as many as it used to be.  She does have occasional right-sided chest pressure, this has been going on for a long time and is not related to the degree of exertion.  She denies any increasing frequency, duration or intensity.  I recommended continue observation and contact cardiology service if her symptom changes.  Previous note mentions concern about polypharmacy which I also agrees.   Past Medical History:  Diagnosis Date  . Anxiety state, unspecified   . Bronchitis, not specified as acute or chronic   . Diaphragmatic hernia without mention of obstruction or gangrene   . Dyslipidemia   . Essential hypertension   . Irritable bowel syndrome   . Mitral valve disorders(424.0)    a. 08/2010 Echo: EF >55%, mild MR, mod TR, trace AI.  [name] Non-obstructive CAD    a. 07/2010 Lexiscan MV: no ischemia, EF 78%; b. 07/2010 Cath: LM nl, LAD50/60p, 40 apical, LCX nl, RCA dominant, 30/40p, 45m.  . Osteoarthrosis, unspecified whether generalized or localized, unspecified site   . Osteoporosis, unspecified   . Other abnormal glucose   . PAF (paroxysmal atrial fibrillation) (HCC)    a. CHA2DS2VASc = 4-->coumadin.  [name]  Phlebitis and thrombophlebitis of superficial vessels of lower extremities   . PSVT (paroxysmal supraventricular tachycardia) ([place])    a. 05/2012 Holter: short bursts of PSVT noted-->Managed with toprol.  [name] Unspecified venous (peripheral) insufficiency   . Unspecified vitamin D deficiency     Past Surgical History:  Procedure Laterality Date  . CARDIAC CATHETERIZATION  07/30/2010   nonobstructive CAD, 20-40% RCA stenosis, 50-60% eccentric LAD stenosis more prox, 40% LAD stenosis more distal, EF 65%  . TRANSTHORACIC ECHOCARDIOGRAM   09/03/2010   EF=>55%; mild MR; mod TR; AV mildly sclerotic with trace regurg; mild pulm valve regurg  . VESICOVAGINAL FISTULA CLOSURE W/ TAH      Current Medications: Current Meds  Medication Sig  . ACCU-CHEK FASTCLIX LANCETS MISC Use to check blood sugars twice a day Dx E11.9 (Patient taking differently: 1 each by Other route in the morning and at bedtime.)  . Alcohol Swabs (B-D SINGLE USE SWABS REGULAR) PADS Use to clean injection site to check blood sugars daily (Patient taking differently: 1 each by Other route See admin instructions. Use to clean injection site to check blood sugars daily)  . ALPRAZolam (XANAX) 0.5 MG tablet TAKE 1/2 TO 1 TABLET BY MOUTH THREE TIMES DAILY AS NEEDED FOR ANXIETY. DO NOT TAKE WITH TRAMADOL.THIS IS A 30 DAY SUPPLY (Patient taking differently: Take 0.25-0.5 mg by mouth 3 (three) times daily as needed for anxiety (DO NOT TAKE WITH TRAMADOL).)  . amLODipine (NORVASC) 5 MG tablet TAKE 1 TABLET(5 MG) BY MOUTH DAILY  . aspirin 81 MG tablet Take 81 mg by mouth daily.  . Cholecalciferol (VITAMIN D3) 50 MCG (2000 UT) capsule Take 1 capsule (2,000 Units total) by mouth daily.  . clidinium-chlordiazePOXIDE (LIBRAX) 5-2.5 MG capsule TAKE 1 CAPSULE BY MOUTH THREE TIMES DAILY AS NEEDED FOR CRAMPS  . diazepam (VALIUM) 5 MG tablet Take by mouth.  . diclofenac Sodium (VOLTAREN) 1 % GEL Apply 2 g topically 4 (four) times daily.  . furosemide (LASIX) 20 MG tablet TAKE 1 TABLET(20 MG) BY MOUTH DAILY AS NEEDED FOR SWELLING (Patient taking differently: Take 20 mg by mouth daily as needed for edema (swelling).)  . glucose blood (ACCU-CHEK AVIVA PLUS) test strip Use to check blood sugars twice a day (Patient taking differently: 1 each by Other route in the morning and at bedtime.)  . hydroxypropyl methylcellulose (ISOPTO TEARS) 2.5 % ophthalmic solution Place 1 drop into both eyes 2 (two) times daily.  . hydrOXYzine (ATARAX/VISTARIL) 25 MG tablet Take 1 tablet (25 mg total) by mouth  every 8 (eight) hours as needed for itching.  . isosorbide mononitrate (IMDUR) 30 MG 24 hr tablet TAKE 1 TABLET BY MOUTH EVERY DAY (Patient taking differently: Take 30 mg by mouth daily.)  . Lancets (ACCU-CHEK SOFT TOUCH) lancets Use as instructed (Patient taking differently: 1 each by Other route as directed.)  . metoprolol succinate (TOPROL-XL) 50 MG 24 hr tablet Takes 1&1/2 tablets twice a day  . Oral Electrolytes (PEDIALYTE) PACK As directed for dehydration (Patient taking differently: 1 each by Other route See admin instructions. As directed for dehydration)  . pantoprazole (PROTONIX) 40 MG tablet TAKE 1 TABLET BY MOUTH EVERY DAY  . simvastatin (ZOCOR) 20 MG tablet TAKE 1 TABLET BY MOUTH AT BEDTIME (Patient taking differently: Take 20 mg by mouth at bedtime.)  . traMADol (ULTRAM) 50 MG tablet Take 1 tablet (50 mg total) by mouth 3 (three) times daily.  [name] warfarin (COUMADIN) 5 MG tablet TAKE 1 TABLET BY MOUTH EVERY  DAY AS DIRECTED BY COUMADIN CLINIC  . [DISCONTINUED] ondansetron (ZOFRAN ODT) 4 MG disintegrating tablet Take 1 tablet (4 mg total) by mouth every 8 (eight) hours as needed for up to 10 doses for nausea or vomiting.  . [DISCONTINUED] ondansetron (ZOFRAN) 4 MG tablet Take 1 tablet (4 mg total) by mouth every 8 (eight) hours as needed for nausea or vomiting.     Allergies:   Clarithromycin, Fosamax [alendronate sodium], Guaifenesin er, Metformin, Prandin [repaglinide], and Tape   Social History   Socioeconomic History  . Marital status: Widowed    Spouse name: Not on file  . Number of children: 4  . Years of education: Not on file  . Highest education level: Not on file  Occupational History  . Occupation: retired    Fish farm manager: RETIRED  Tobacco Use  . Smoking status: Never Smoker  . Smokeless tobacco: Never Used  Vaping Use  . Vaping Use: Never used  Substance and Sexual Activity  . Alcohol use: No  . Drug use: No  . Sexual activity: Not Currently  Other Topics Concern   . Not on file  Social History Narrative  . Not on file   Social Determinants of Health   Financial Resource Strain: High Risk  . Difficulty of Paying Living Expenses: Very hard  Food Insecurity: Food Insecurity Present  . Worried About Charity fundraiser in the Last Year: Often true  . Ran Out of Food in the Last Year: Often true  Transportation Needs: No Transportation Needs  . Lack of Transportation (Medical): No  . Lack of Transportation (Non-Medical): No  Physical Activity: Insufficiently Active  . Days of Exercise per Week: 3 days  . Minutes of Exercise per Session: 30 min  Stress: No Stress Concern Present  . Feeling of Stress : Not at all  Social Connections: Not on file     Family History: The patient's family history includes Clotting disorder in her paternal grandmother; Stroke in her brother, father, and mother.  ROS:   Please see the history of present illness.     All other systems reviewed and are negative.  EKGs/Labs/Other Studies Reviewed:    The following studies were reviewed today:  Echo 05/19/2016 LV EF: 60% -  65%   -------------------------------------------------------------------  Indications:   (I48.91).   -------------------------------------------------------------------  History:  PMH: Acquired from the patient and from the patient&'s  chart. Dyspnea. Atrial fibrillation. Risk factors:  Hypertension. Dyslipidemia.   -------------------------------------------------------------------  Study Conclusions   - Left ventricle: The cavity size was normal. There was mild focal  basal hypertrophy of the septum. Systolic function was normal.  The estimated ejection fraction was in the range of 60% to 65%.  Wall motion was normal; there were no regional wall motion  abnormalities. Doppler parameters are consistent with abnormal  left ventricular relaxation (grade 1 diastolic dysfunction).  - Pulmonary arteries: Systolic  pressure was mildly increased. PA  peak pressure: 34 mm Hg (S).   EKG:  EKG is not ordered today.   Recent Labs: 05/08/2020: ALT 20 05/20/2020: B Natriuretic Peptide 78.9 05/24/2020: TSH 0.856 06/23/2020: BUN 12; Creatinine, Ser 0.88; Hemoglobin 12.3; Magnesium 2.0; Platelets 243; Potassium 3.4; Sodium 137  Recent Lipid Panel    Component Value Date/Time   CHOL 176 11/18/2018 1024   CHOL 160 06/09/2018 1046   TRIG 75.0 11/18/2018 1024   HDL 72.20 11/18/2018 1024   HDL 72 06/09/2018 1046   CHOLHDL 2 11/18/2018 1024   VLDL 15.0 11/18/2018  1024   LDLCALC 89 11/18/2018 1024   LDLCALC 74 06/09/2018 1046   LDLDIRECT 105.5 11/26/2010 0905     Risk Assessment/Calculations:    CHA2DS2-VASc Score = 6  This indicates a 9.7% annual risk of stroke. The patient's score is based upon: CHF History: No HTN History: Yes Diabetes History: Yes Stroke History: No Vascular Disease History: Yes Age Score: 2 Gender Score: 1      Physical Exam:    VS:  BP 118/70 (BP Location: Left Arm, Patient Position: Sitting, Cuff Size: Normal)   Pulse 66   Ht 5\' 6"  (1.676 m)   Wt 186 lb (84.4 kg)   BMI 30.02 kg/m     Wt Readings from Last 3 Encounters:  06/27/20 186 lb (84.4 kg)  06/01/20 183 lb 6.4 oz (83.2 kg)  05/24/20 180 lb (81.6 kg)     GEN:  Well nourished, well developed in no acute distress HEENT: Normal NECK: No JVD; No carotid bruits LYMPHATICS: No lymphadenopathy CARDIAC: RRR, no murmurs, rubs, gallops RESPIRATORY:  Clear to auscultation without rales, wheezing or rhonchi  ABDOMEN: Soft, non-tender, non-distended MUSCULOSKELETAL:  No edema; No deformity  SKIN: Warm and dry NEUROLOGIC:  Alert and oriented x 3 PSYCHIATRIC:  Normal affect   ASSESSMENT:    1. Palpitations   2. PAF (paroxysmal atrial fibrillation) ([place])   3. Essential hypertension   4. Hyperlipidemia LDL goal <70   5. Controlled type 2 diabetes mellitus without complication, without long-term current use of  insulin ([place])   6. Coronary artery disease involving native coronary artery of native heart without angina pectoris   7. COVID-19   8. Polypharmacy    PLAN:    In order of problems listed above:  1. Palpitation: Likely related to SVT.  Unfortunately patient is unable to tolerate 100 mg twice daily of Toprol-XL.  She is currently on 75 mg twice daily dosing and seems to be tolerating this dose.  We will continue on the current therapy  2. PAF: No recent atrial fibrillation on her monitor.  Continue Coumadin  3. Hypertension: Blood pressure stable  4. Hyperlipidemia: On Zocor  5. DM2: Managed by primary care provider  6. CAD: History of nonobstructive CAD, no recent chest pain  7. COVID-19: Patient had COVID-19 infection in January 2022, she seems to be recovering quite well   8. Polypharmacy: There is high suspicion that patient has polypharmacy.  I have instructed her to avoid Valium while taking Xanax.  I am concerned that she might not be able to manage her own medication at home.        Medication Adjustments/Labs and Tests Ordered: Current medicines are reviewed at length with the patient today.  Concerns regarding medicines are outlined above.  No orders of the defined types were placed in this encounter.  No orders of the defined types were placed in this encounter.   Patient Instructions  Medication Instructions:  Your physician recommends that you continue on your current medications as directed. Please refer to the Current Medication list given to you today.  *If you need a refill on your cardiac medications before your next appointment, please call your pharmacy*  Lab Work: NONE ordered at this time of appointment   If you have labs (blood work) drawn today and your tests are completely normal, you will receive your results only by: [name] MyChart Message (if you have MyChart) OR . A paper copy in the mail If you have any lab test that is  abnormal or we need to  change your treatment, we will call you to review the results.  Testing/Procedures: NONE ordered at this time of appointment   Follow-Up: At [facility], you and your health needs are our priority.  As part of our continuing mission to provide you with exceptional heart care, we have created designated Provider Care Teams.  These Care Teams include your primary Cardiologist (physician) and Advanced Practice Providers (APPs -  Physician Assistants and Nurse Practitioners) who all work together to provide you with the care you need, when you need it.  We recommend signing up for the patient portal called "MyChart".  Sign up information is provided on this After Visit Summary.  MyChart is used to connect with patients for Virtual Visits (Telemedicine).  Patients are able to view lab/test results, encounter notes, upcoming appointments, etc.  Non-urgent messages can be sent to your provider as well.   To learn more about what you can do with MyChart, go to [email or web address].    Your next appointment:   3 month(s)  The format for your next appointment:   In Person  Provider:   Raliegh Ip Mali Hilty, MD  Other Instructions      Signed, Almyra Deforest, [place]  06/29/2020 12:22 AM    [place]

## 2020-06-27 NOTE — Patient Instructions (Signed)
Take 2 tablets tonight only and then increase to 1 tablet daily except 1.5 tablets on Wednesday.  Repeat INR in 4 weeks

## 2020-06-27 NOTE — Patient Instructions (Addendum)
Medication Instructions:  Your physician recommends that you continue on your current medications as directed. Please refer to the Current Medication list given to you today.  *If you need a refill on your cardiac medications before your next appointment, please call your pharmacy*   Lab Work: NONE ordered at this time of appointment   If you have labs (blood work) drawn today and your tests are completely normal, you will receive your results only by: MyChart Message (if you have MyChart) OR A paper copy in the mail If you have any lab test that is abnormal or we need to change your treatment, we will call you to review the results.   Testing/Procedures: NONE ordered at this time of appointment     Follow-Up: At [facility], you and your health needs are our priority.  As part of our continuing mission to provide you with exceptional heart care, we have created designated Provider Care Teams.  These Care Teams include your primary Cardiologist (physician) and Advanced Practice Providers (APPs -  Physician Assistants and Nurse Practitioners) who all work together to provide you with the care you need, when you need it.  We recommend signing up for the patient portal called "MyChart".  Sign up information is provided on this After Visit Summary.  MyChart is used to connect with patients for Virtual Visits (Telemedicine).  Patients are able to view lab/test results, encounter notes, upcoming appointments, etc.  Non-urgent messages can be sent to your provider as well.   To learn more about what you can do with MyChart, go to [email or web address].    Your next appointment:   3 month(s)  The format for your next appointment:   In Person  Provider:   K. Chad Hilty, MD   Other Instructions    

## 2020-06-30 ENCOUNTER — Other Ambulatory Visit: Payer: Self-pay | Admitting: Internal Medicine

## 2020-07-04 ENCOUNTER — Ambulatory Visit: Payer: Medicare HMO | Admitting: Internal Medicine

## 2020-07-04 ENCOUNTER — Other Ambulatory Visit: Payer: Self-pay | Admitting: Internal Medicine

## 2020-07-06 ENCOUNTER — Telehealth: Payer: Self-pay | Admitting: Internal Medicine

## 2020-07-06 NOTE — Telephone Encounter (Signed)
**Note De-Identified Agron Swiney Obfuscation** I started a Metoprolol quantity exception through covermymeds. Key: [id number]

## 2020-07-06 NOTE — Telephone Encounter (Signed)
Received notice from [employer] that Metoprolol Succinate is subject to Quantity Limitations  Patient takes Metoprolol Succinate 50mg  tablets - 1.5 tablets twice daily = 3 tablets per day  Routed to prior authorization nurse Jeani Hawking Via to assist with Quantity Limit exception request  Phone: [phone] Fax: [phone]

## 2020-07-09 NOTE — Telephone Encounter (Signed)
Approved for quantity limit until 04/13/2021

## 2020-07-11 ENCOUNTER — Other Ambulatory Visit: Payer: Self-pay

## 2020-07-12 ENCOUNTER — Encounter: Payer: Self-pay | Admitting: Internal Medicine

## 2020-07-12 ENCOUNTER — Other Ambulatory Visit: Payer: Self-pay | Admitting: Internal Medicine

## 2020-07-12 ENCOUNTER — Ambulatory Visit (INDEPENDENT_AMBULATORY_CARE_PROVIDER_SITE_OTHER): Payer: Medicare HMO | Admitting: Internal Medicine

## 2020-07-12 DIAGNOSIS — I2583 Coronary atherosclerosis due to lipid rich plaque: Secondary | ICD-10-CM | POA: Diagnosis not present

## 2020-07-12 DIAGNOSIS — I10 Essential (primary) hypertension: Secondary | ICD-10-CM

## 2020-07-12 DIAGNOSIS — I480 Paroxysmal atrial fibrillation: Secondary | ICD-10-CM | POA: Diagnosis not present

## 2020-07-12 DIAGNOSIS — F419 Anxiety disorder, unspecified: Secondary | ICD-10-CM | POA: Diagnosis not present

## 2020-07-12 DIAGNOSIS — E119 Type 2 diabetes mellitus without complications: Secondary | ICD-10-CM | POA: Diagnosis not present

## 2020-07-12 DIAGNOSIS — I2510 Atherosclerotic heart disease of native coronary artery without angina pectoris: Secondary | ICD-10-CM

## 2020-07-12 DIAGNOSIS — E876 Hypokalemia: Secondary | ICD-10-CM | POA: Diagnosis not present

## 2020-07-12 LAB — COMPREHENSIVE METABOLIC PANEL
ALT: 10 U/L (ref 0–35)
AST: 15 U/L (ref 0–37)
Albumin: 4.4 g/dL (ref 3.5–5.2)
Alkaline Phosphatase: 59 U/L (ref 39–117)
BUN: 16 mg/dL (ref 6–23)
CO2: 31 mEq/L (ref 19–32)
Calcium: 9.5 mg/dL (ref 8.4–10.5)
Chloride: 102 mEq/L (ref 96–112)
Creatinine, Ser: 1.14 mg/dL (ref 0.40–1.20)
GFR: 44.44 mL/min — ABNORMAL LOW (ref >60.00)
Glucose, Bld: 105 mg/dL — ABNORMAL HIGH (ref 70–99)
Potassium: 3.8 mEq/L (ref 3.5–5.1)
Sodium: 138 mEq/L (ref 135–145)
Total Bilirubin: 0.4 mg/dL (ref 0.2–1.2)
Total Protein: 7.2 g/dL (ref 6.0–8.3)

## 2020-07-12 MED ORDER — HYOSCYAMINE SULFATE 0.125 MG PO TABS: 0.1250 mg | ORAL_TABLET | Freq: Four times a day (QID) | ORAL | 3 refills | Status: DC | PRN

## 2020-07-12 NOTE — Addendum Note (Signed)
Addended by: Cresenciano Lick on: 07/12/2020 02:46 PM   Modules accepted: Orders

## 2020-07-12 NOTE — Assessment & Plan Note (Signed)
On Metoprolol, Norvasc

## 2020-07-12 NOTE — Assessment & Plan Note (Signed)
On Coumadin, Toprol XL CHA2DS2VASc = 4-->coumadin.  Potential benefits of a long term Coumadin use as well as potential risks  and complications were explained to the patient and were aknowledged.

## 2020-07-12 NOTE — Assessment & Plan Note (Signed)
Valerian root 

## 2020-07-12 NOTE — Assessment & Plan Note (Signed)
Check BMET

## 2020-07-12 NOTE — Progress Notes (Signed)
Subjective:  Patient ID: [name], female    DOB: 1934/08/03  Age: 86 y.o. MRN: [id number]  CC: Follow-up (3 months)   HPI CHAKITA MCGRAW presents for palpitationas, A fi, low potassium f/u S/p ER visit on 3/12. Per Cardiol f/u - "In order of problems listed above:  1. Palpitation: Likely related to SVT.  Unfortunately patient is unable to tolerate 100 mg twice daily of Toprol-XL.  She is currently on 75 mg twice daily dosing and seems to be tolerating this dose.  We will continue on the current therapy  2. PAF: No recent atrial fibrillation on her monitor.  Continue Coumadin  3. Hypertension: Blood pressure stable  4. Hyperlipidemia: On Zocor  5. DM2: Managed by primary care provider  6. CAD: History of nonobstructive CAD, no recent chest pain  7. COVID-19: Patient had COVID-19 infection in January 2022, she seems to be recovering quite well   8. Polypharmacy: There is high suspicion that patient has polypharmacy.  I have instructed her to avoid Valium while taking Xanax.  I am concerned that she might not be able to manage her own medication at home."   Outpatient Medications Prior to Visit  Medication Sig Dispense Refill  . ALPRAZolam (XANAX) 0.5 MG tablet TAKE 1/2 TO 1 TABLET BY MOUTH THREE TIMES DAILY AS NEEDED FOR ANXIETY. DO NOT TAKE WITH TRAMADOL.THIS IS A 30 DAY SUPPLY 90 tablet 1  . amLODipine (NORVASC) 5 MG tablet TAKE 1 TABLET(5 MG) BY MOUTH DAILY 90 tablet 3  . aspirin 81 MG tablet Take 81 mg by mouth daily.    . Cholecalciferol (VITAMIN D3) 50 MCG (2000 UT) capsule Take 1 capsule (2,000 Units total) by mouth daily. 100 capsule 3  . clidinium-chlordiazePOXIDE (LIBRAX) 5-2.5 MG capsule TAKE 1 CAPSULE BY MOUTH THREE TIMES DAILY AS NEEDED FOR CRAMPS 60 capsule 1  . diazepam (VALIUM) 5 MG tablet Take by mouth.    . diclofenac Sodium (VOLTAREN) 1 % GEL Apply 2 g topically 4 (four) times daily. 150 g 1  . furosemide (LASIX) 20 MG tablet TAKE 1 TABLET(20  MG) BY MOUTH DAILY AS NEEDED FOR SWELLING (Patient taking differently: Take 20 mg by mouth daily as needed for edema (swelling).) 30 tablet 3  . hydroxypropyl methylcellulose (ISOPTO TEARS) 2.5 % ophthalmic solution Place 1 drop into both eyes 2 (two) times daily.    . hydrOXYzine (ATARAX/VISTARIL) 25 MG tablet Take 1 tablet (25 mg total) by mouth every 8 (eight) hours as needed for itching. 60 tablet 0  . isosorbide mononitrate (IMDUR) 30 MG 24 hr tablet TAKE 1 TABLET BY MOUTH EVERY DAY 90 tablet 3  . metoprolol succinate (TOPROL-XL) 50 MG 24 hr tablet Takes 1&1/2 tablets twice a day 90 tablet 6  . Oral Electrolytes (PEDIALYTE) PACK As directed for dehydration (Patient taking differently: 1 each by Other route See admin instructions. As directed for dehydration) 6 each 1  . ACCU-CHEK FASTCLIX LANCETS MISC Use to check blood sugars twice a day Dx E11.9 (Patient taking differently: 1 each by Other route in the morning and at bedtime.) 102 each 2  . Alcohol Swabs (B-D SINGLE USE SWABS REGULAR) PADS Use to clean injection site to check blood sugars daily (Patient taking differently: 1 each by Other route See admin instructions. Use to clean injection site to check blood sugars daily) 300 each 3  . glucose blood (ACCU-CHEK AVIVA PLUS) test strip Use to check blood sugars twice a day (Patient taking differently: 1  each by Other route in the morning and at bedtime.) 100 each 2  . Lancets (ACCU-CHEK SOFT TOUCH) lancets Use as instructed (Patient taking differently: 1 each by Other route as directed.) 300 each 3  . pantoprazole (PROTONIX) 40 MG tablet TAKE 1 TABLET BY MOUTH EVERY DAY 90 tablet 3  . simvastatin (ZOCOR) 20 MG tablet TAKE 1 TABLET BY MOUTH AT BEDTIME (Patient taking differently: Take 20 mg by mouth at bedtime.) 90 tablet 1  . traMADol (ULTRAM) 50 MG tablet Take 1 tablet (50 mg total) by mouth 3 (three) times daily. 90 tablet 3  . warfarin (COUMADIN) 5 MG tablet TAKE 1 TABLET BY MOUTH EVERY DAY AS  DIRECTED BY COUMADIN CLINIC 90 tablet 0   No facility-administered medications prior to visit.    ROS: Review of Systems  Constitutional: Positive for fatigue. Negative for activity change, appetite change, chills and unexpected weight change.  HENT: Negative for congestion, mouth sores and sinus pressure.   Eyes: Negative for visual disturbance.  Respiratory: Negative for cough and chest tightness.   Cardiovascular: Positive for palpitations. Negative for leg swelling.  Gastrointestinal: Negative for abdominal pain and nausea.  Genitourinary: Negative for difficulty urinating, frequency and vaginal pain.  Musculoskeletal: Negative for back pain and gait problem.  Skin: Negative for pallor and rash.  Neurological: Negative for dizziness, tremors, weakness, numbness and headaches.  Psychiatric/Behavioral: Negative for confusion and sleep disturbance.    Objective:  BP 130/60 (BP Location: Right Arm, Patient Position: Sitting, Cuff Size: Large)   Pulse 97   Temp 98.7 F (37.1 C) (Oral)   Ht 5\' 6"  (1.676 m)   Wt 178 lb 9.6 oz (81 kg)   SpO2 98%   BMI 28.83 kg/m   BP Readings from Last 3 Encounters:  07/12/20 130/60  06/27/20 118/70  06/23/20 (!) 132/107    Wt Readings from Last 3 Encounters:  07/12/20 178 lb 9.6 oz (81 kg)  06/27/20 186 lb (84.4 kg)  06/01/20 183 lb 6.4 oz (83.2 kg)    Physical Exam Constitutional:      General: She is not in acute distress.    Appearance: She is well-developed. She is obese.  HENT:     Head: Normocephalic.     Right Ear: External ear normal.     Left Ear: External ear normal.     Nose: Nose normal.  Eyes:     General:        Right eye: No discharge.        Left eye: No discharge.     Conjunctiva/sclera: Conjunctivae normal.     Pupils: Pupils are equal, round, and reactive to light.  Neck:     Thyroid: No thyromegaly.     Vascular: No JVD.     Trachea: No tracheal deviation.  Cardiovascular:     Rate and Rhythm: Normal  rate and regular rhythm.     Heart sounds: Normal heart sounds.  Pulmonary:     Effort: No respiratory distress.     Breath sounds: No stridor. No wheezing.  Abdominal:     General: Bowel sounds are normal. There is no distension.     Palpations: Abdomen is soft. There is no mass.     Tenderness: There is no abdominal tenderness. There is no guarding or rebound.  Musculoskeletal:        General: No tenderness.     Cervical back: Normal range of motion and neck supple.  Lymphadenopathy:     Cervical: No  cervical adenopathy.  Skin:    Findings: No erythema or rash.  Neurological:     Cranial Nerves: No cranial nerve deficit.     Motor: No abnormal muscle tone.     Coordination: Coordination normal.     Deep Tendon Reflexes: Reflexes normal.  Psychiatric:        Behavior: Behavior normal.        Thought Content: Thought content normal.        Judgment: Judgment normal.     Lab Results  Component Value Date   WBC 6.4 06/23/2020   HGB 12.3 06/23/2020   HCT 38.6 06/23/2020   PLT 243 06/23/2020   GLUCOSE 169 (H) 06/23/2020   CHOL 176 11/18/2018   TRIG 75.0 11/18/2018   HDL 72.20 11/18/2018   LDLDIRECT 105.5 11/26/2010   LDLCALC 89 11/18/2018   ALT 20 05/08/2020   AST 33 05/08/2020   NA 137 06/23/2020   K 3.4 (L) 06/23/2020   CL 99 06/23/2020   CREATININE 0.88 06/23/2020   BUN 12 06/23/2020   CO2 28 06/23/2020   TSH 0.856 05/24/2020   INR 1.8 (A) 06/27/2020   HGBA1C 7.1 (H) 03/28/2020    DG Chest 2 View  Result Date: 06/23/2020 CLINICAL DATA:  Palpitations, hypertension EXAM: CHEST - 2 VIEW COMPARISON:  05/24/2020 FINDINGS: The heart size and mediastinal contours are within normal limits. Both lungs are clear. The visualized skeletal structures are unremarkable. IMPRESSION: No active cardiopulmonary disease. Electronically Signed   By: Randa Ngo M.D.   On: 06/23/2020 01:18    Assessment & Plan:    Walker Kehr, MD

## 2020-07-12 NOTE — Assessment & Plan Note (Signed)
No angina on Isosorbide

## 2020-07-12 NOTE — Patient Instructions (Signed)
Valerian root 

## 2020-07-25 ENCOUNTER — Ambulatory Visit (INDEPENDENT_AMBULATORY_CARE_PROVIDER_SITE_OTHER): Payer: Medicare HMO

## 2020-07-25 ENCOUNTER — Other Ambulatory Visit: Payer: Self-pay

## 2020-07-25 DIAGNOSIS — Z7901 Long term (current) use of anticoagulants: Secondary | ICD-10-CM | POA: Diagnosis not present

## 2020-07-25 DIAGNOSIS — I480 Paroxysmal atrial fibrillation: Secondary | ICD-10-CM

## 2020-07-25 LAB — POCT INR: INR: 3 (ref 2.0–3.0)

## 2020-07-25 NOTE — Patient Instructions (Signed)
Continue taking 1 tablet daily except 1.5 tablets on Wednesday.  Repeat INR in 6 weeks

## 2020-08-07 ENCOUNTER — Encounter: Payer: Medicare HMO | Admitting: Internal Medicine

## 2020-08-12 ENCOUNTER — Other Ambulatory Visit: Payer: Self-pay | Admitting: Internal Medicine

## 2020-09-05 ENCOUNTER — Ambulatory Visit (INDEPENDENT_AMBULATORY_CARE_PROVIDER_SITE_OTHER): Payer: Medicare HMO

## 2020-09-05 ENCOUNTER — Other Ambulatory Visit: Payer: Self-pay | Admitting: Urology

## 2020-09-05 ENCOUNTER — Other Ambulatory Visit: Payer: Self-pay

## 2020-09-05 DIAGNOSIS — D4102 Neoplasm of uncertain behavior of left kidney: Secondary | ICD-10-CM

## 2020-09-05 DIAGNOSIS — I480 Paroxysmal atrial fibrillation: Secondary | ICD-10-CM

## 2020-09-05 DIAGNOSIS — Z7901 Long term (current) use of anticoagulants: Secondary | ICD-10-CM

## 2020-09-05 LAB — POCT INR: INR: 3.8 — AB (ref 2.0–3.0)

## 2020-09-05 NOTE — Patient Instructions (Signed)
Hold tonight and Continue taking 1 tablet daily except 1.5 tablets on Wednesday.  Repeat INR in 3 weeks

## 2020-09-06 ENCOUNTER — Other Ambulatory Visit: Payer: Self-pay | Admitting: Internal Medicine

## 2020-09-15 ENCOUNTER — Other Ambulatory Visit: Payer: Self-pay | Admitting: Internal Medicine

## 2020-09-15 DIAGNOSIS — I480 Paroxysmal atrial fibrillation: Secondary | ICD-10-CM

## 2020-09-18 ENCOUNTER — Ambulatory Visit: Payer: Medicare HMO | Admitting: Internal Medicine

## 2020-09-27 ENCOUNTER — Other Ambulatory Visit: Payer: Self-pay

## 2020-09-27 ENCOUNTER — Ambulatory Visit (INDEPENDENT_AMBULATORY_CARE_PROVIDER_SITE_OTHER): Payer: Medicare HMO | Admitting: Internal Medicine

## 2020-09-27 ENCOUNTER — Encounter: Payer: Self-pay | Admitting: Internal Medicine

## 2020-09-27 ENCOUNTER — Ambulatory Visit (INDEPENDENT_AMBULATORY_CARE_PROVIDER_SITE_OTHER): Payer: Medicare HMO | Admitting: Pharmacist

## 2020-09-27 VITALS — BP 144/82 | HR 60 | Ht 60.0 in | Wt 182.0 lb

## 2020-09-27 DIAGNOSIS — I480 Paroxysmal atrial fibrillation: Secondary | ICD-10-CM | POA: Diagnosis not present

## 2020-09-27 DIAGNOSIS — I2510 Atherosclerotic heart disease of native coronary artery without angina pectoris: Secondary | ICD-10-CM | POA: Diagnosis not present

## 2020-09-27 DIAGNOSIS — Z7901 Long term (current) use of anticoagulants: Secondary | ICD-10-CM

## 2020-09-27 DIAGNOSIS — I10 Essential (primary) hypertension: Secondary | ICD-10-CM | POA: Diagnosis not present

## 2020-09-27 DIAGNOSIS — I471 Supraventricular tachycardia: Secondary | ICD-10-CM | POA: Diagnosis not present

## 2020-09-27 LAB — POCT INR: INR: 4.2 — AB (ref 2.0–3.0)

## 2020-09-27 NOTE — Patient Instructions (Signed)
HOLD warfarin dose today, then decrease warfarin to 5mg  (1 tablet daily).  Repeat INR in 2 weeks.  *Consistent amount of greens every week*

## 2020-09-27 NOTE — Patient Instructions (Signed)

## 2020-09-27 NOTE — Progress Notes (Signed)
OFFICE NOTE  Chief Complaint:  Follow-up palpitations  Primary Care Physician: Cassandria Anger, MD  HPI:  [name]  is a 86 year old female with a history of paroxysmal atrial fibrillation and SVT in the past. This happened when she underwent a Myoview for chest pain in the hospital. She was placed on metoprolol succinate 25 b.i.d. and has done well with that with improvement in her palpitations. Her echo showed an EF greater than 55% with some mild aortic insufficiency and an aneurysmal atrial septum. She is on simvastatin as well as Coumadin managed by you. She's recently had some abnormal labs which showed borderline diabetes. She's not currently a medication for that. She reports occasional intermittent chest pain. She underwent a cardia catheterization in the past which showed mild to moderate nonobstructive coronary disease which is managed medically. She does also have bilateral venous insufficiency of the lower chin and knees and has worn compression stockings with improvement in her swelling and symptoms.  [name] returns today without any new complaints. She denies any shortness of breath or chest pain. She does get some occasional chest wall pain and is relieved with tramadol.  I saw [name] back in the office today. Overall she reports she is doing very well. She's had very few episodes of palpitations. She has had a decreased dose in her amlodipine to 5 mg daily, likely due to leg swelling. She is now taking Lasix as needed for edema. She's had a few episodes of racing heart for which she is to perform vagal maneuvers successfully. EKG shows sinus rhythm with PVCs and PACs but no evidence of atrial fibrillation in the office today. She is on warfarin anticoagulation. We will check the INR in the office today.  [name] returns today for follow-up. Unfortunately her son died this past 07-11-2022 of leukemia. She says he's been in and out of the hospital for  several months. She has had it small increase in palpitations recently but seems to be controlled with taking and a half dose of metoprolol. She denies any chest pain although has some pain behind her right shoulder blade. This is intermittent and has improved. Is not associated with exertion or relieved by rest. Her EKG today shows normal sinus rhythm without ischemia. Recent A1c was 6.8-she is not on diabetes medications. I do not see a recent lipid profile though she is on simvastatin 20 mg every OD.  04/24/2016  [name] returns today for follow-up. She has no new complaints. It's been about a year since she had the loss of her son due to leukemia. She seems to be doing fairly well although is routinely very calm and demeanor. Her blood pressure is well-controlled. Her INR was therapeutic today. She denies any worsening swelling, chest pain or shortness of breath. She denies any bleeding problems. She's not had any recurrent atrial fibrillation or palpitations that she is aware of. Her last echo was in 2012 and at the time she had mild nonobstructive coronary disease by catheterization. She was also noted to have moderate tricuspid regurgitation and some LVH. Her EKG today shows moderate LVH by voltage criteria.  04/28/2017  [name] returns today for follow-up.  She denies any chest pain or worsening shortness of breath.  She saw her primary care provider yesterday no medicine changes were made.  She is contemplating removing a small mass on the left inner thigh.  He has an appointment with a surgeon coming up and we may  need to hold her warfarin for that.  She has had no significant recurrent atrial fibrillation.  She is in sinus today.  She prefers to stay on warfarin.  INR has been therapeutic and is adjusted every 6 weeks.  04/29/2018  [name] seen today for routine annual follow-up.  Overall she is without complaints.  Her warfarin testing has been stable.  Blood pressures well  controlled today.  She denies chest pain or worsening shortness of breath.  Cholesterol has been at goal.  EKG personally reviewed shows sinus bradycardia and voltage criteria for LVH at 44.  04/2018  [name] seen today for follow-up.  She is accompanied by her daughter.  Overall she seems to be doing well.  She denies any chest pain or worsening shortness of breath.  She denies any symptomatic atrial fibrillation.  She remains on warfarin for anticoagulation.  She also takes metoprolol succinate which we recently increased to 50 mg twice daily for palpitations.  She was in the ER with A. fib and RVR.  She is noted since increasing her beta-blocker that her palpitations have improved.  12/27/2019  [name] returns for follow-up.  Overall she is doing well.  She has been having some complaints about a tendinitis in her left foot.  Otherwise blood pressure is controlled.  She has rare palpitations.  She is due for repeat labs although sees her primary care provider next month.  05/17/2020  [name] seen today in follow-up.  She recently had a COVID-19 infection in early January.  She was subsequently seen by Almyra Deforest, PA-C in the office.  She had noted increases in palpitations.  He advised her to take an extra half tablet of Toprol if necessary.  She says this really has not helped much.  She gets palpitations mostly at night and she says she cannot sleep basically every other night.  Her symptoms that apparently worsened and she went to the ER for this.  She was noted to be in sinus rhythm there and did not have any changes to her medications.  She is on warfarin.  Heart rate today is low in the 60s without irregularity.  It is difficult to know whether she is having recurrent A. fib or whether this could be sequelae of COVID-19 infection.  She does have a history of moderate nonobstructive coronary disease by cath but back in 2012.  09/27/2020  [name] returns today for follow-up of  her palpitations.  She reports in general they have improved.  Despite showing recurrent episodes of SVT on a monitor, I had advised her to increase her Toprol to 100 mg twice daily.  She currently continues to take Toprol-XL 75 mg twice daily.  She said that she did try the higher dose but felt unwell.  Currently blood pressure is minimally elevated at 144/82.  EKG shows a sinus rhythm with first-degree AV block.  PMHx:  Past Medical History:  Diagnosis Date   Anxiety state, unspecified    Bronchitis, not specified as acute or chronic    Diaphragmatic hernia without mention of obstruction or gangrene    Dyslipidemia    Essential hypertension    Irritable bowel syndrome    Mitral valve disorders(424.0)    a. 08/2010 Echo: EF >55%, mild MR, mod TR, trace AI.   Non-obstructive CAD    a. 07/2010 Lexiscan MV: no ischemia, EF 78%; b. 07/2010 Cath: LM nl, LAD50/60p, 40 apical, LCX nl, RCA dominant, 30/40p, 72m.   Osteoarthrosis,  unspecified whether generalized or localized, unspecified site    Osteoporosis, unspecified    Other abnormal glucose    PAF (paroxysmal atrial fibrillation) (HCC)    a. CHA2DS2VASc = 4-->coumadin.   Phlebitis and thrombophlebitis of superficial vessels of lower extremities    PSVT (paroxysmal supraventricular tachycardia) ([place])    a. 05/2012 Holter: short bursts of PSVT noted-->Managed with toprol.   Unspecified venous (peripheral) insufficiency    Unspecified vitamin D deficiency     Past Surgical History:  Procedure Laterality Date   CARDIAC CATHETERIZATION  07/30/2010   nonobstructive CAD, 20-40% RCA stenosis, 50-60% eccentric LAD stenosis more prox, 40% LAD stenosis more distal, EF 65%   TRANSTHORACIC ECHOCARDIOGRAM  09/03/2010   EF=>55%; mild MR; mod TR; AV mildly sclerotic with trace regurg; mild pulm valve regurg   VESICOVAGINAL FISTULA CLOSURE W/ TAH      FAMHx:  Family History  Problem Relation Age of Onset   Stroke Mother    Stroke Father        also  heart disease   Clotting disorder Paternal Grandmother        blood clot   Stroke Brother        also heart disease    SOCHx:   reports that she has never smoked. She has never used smokeless tobacco. She reports that she does not drink alcohol and does not use drugs.  ALLERGIES:  Allergies  Allergen Reactions   Clarithromycin     REACTION: unspecified   Fosamax [Alendronate Sodium]     achy   Guaifenesin Er     itching   Metformin     REACTION: pt states INTOL to Metformin "felt drained"   Prandin [Repaglinide]     Prandin dropped CBGs to 66 once - she stopped it.   Tape     Adhesive tape---rash    ROS: Pertinent items noted in HPI and remainder of comprehensive ROS otherwise negative.  HOME MEDS: Current Outpatient Medications  Medication Sig Dispense Refill   ACCU-CHEK AVIVA PLUS test strip USE TO CHECK BLOOD SUGARS DAILY 100 strip 2   Accu-Chek Softclix Lancets lancets USE  AS  INSTRUCTED 100 each 2   Alcohol Swabs (B-D SINGLE USE SWABS REGULAR) PADS USE TO CLEAN INJECTION SITE TO CHECK BLOOD SUGARS DAILY 100 each 2   ALPRAZolam (XANAX) 0.5 MG tablet TAKE 1/2 TO 1 TABLET BY MOUTH THREE TIMES DAILY AS NEEDED FOR ANXIETY. DO NOT TAKE WITH TRAMADOL.THIS IS A 30 DAY SUPPLY 90 tablet 1   amLODipine (NORVASC) 5 MG tablet TAKE 1 TABLET(5 MG) BY MOUTH DAILY 90 tablet 3   aspirin 81 MG tablet Take 81 mg by mouth daily.     Cholecalciferol (VITAMIN D3) 50 MCG (2000 UT) capsule TAKE 1 CAPSULE BY MOUTH ONCE DAILY 100 capsule 3   diclofenac Sodium (VOLTAREN) 1 % GEL Apply 2 g topically 4 (four) times daily. 150 g 1   furosemide (LASIX) 20 MG tablet TAKE 1 TABLET(20 MG) BY MOUTH DAILY AS NEEDED FOR SWELLING (Patient taking differently: Take 20 mg by mouth daily as needed for edema (swelling).) 30 tablet 3   hydroxypropyl methylcellulose (ISOPTO TEARS) 2.5 % ophthalmic solution Place 1 drop into both eyes 2 (two) times daily.     simvastatin (ZOCOR) 20 MG tablet TAKE 1 TABLET BY MOUTH  AT BEDTIME 90 tablet 1   traMADol (ULTRAM) 50 MG tablet Take 1 tablet (50 mg total) by mouth 3 (three) times daily. 90 tablet 3  warfarin (COUMADIN) 5 MG tablet TAKE 1 TABLET BY MOUTH EVERY DAY AS DIRECTED BY COUMADIN CLINIC 90 tablet 0   diazepam (VALIUM) 5 MG tablet Take by mouth.     hydrOXYzine (ATARAX/VISTARIL) 25 MG tablet Take 1 tablet (25 mg total) by mouth every 8 (eight) hours as needed for itching. (Patient not taking: Reported on 09/27/2020) 60 tablet 0   hyoscyamine (LEVSIN) 0.125 MG tablet Take 1 tablet (0.125 mg total) by mouth every 6 (six) hours as needed for up to 10 days for cramping. (Patient not taking: Reported on 09/27/2020) 100 tablet 3   isosorbide mononitrate (IMDUR) 30 MG 24 hr tablet TAKE 1 TABLET BY MOUTH EVERY DAY 90 tablet 3   metoprolol succinate (TOPROL-XL) 50 MG 24 hr tablet Takes 1&1/2 tablets twice a day 90 tablet 6   pantoprazole (PROTONIX) 40 MG tablet TAKE 1 TABLET BY MOUTH EVERY DAY 90 tablet 3   No current facility-administered medications for this visit.    LABS/IMAGING: No results found for this or any previous visit (from the past 48 hour(s)). No results found.  VITALS: BP (!) 144/82 (BP Location: Left Arm, Patient Position: Sitting, Cuff Size: Normal)   Pulse 60   Ht 5' (1.524 m)   Wt 182 lb (82.6 kg)   SpO2 98%   BMI 35.54 kg/m   EXAM: General appearance: alert and no distress Neck: no carotid bruit, no JVD and thyroid not enlarged, symmetric, no tenderness/mass/nodules Lungs: clear to auscultation bilaterally Heart: regular rate and rhythm, S1, S2 normal, no murmur, click, rub or gallop Abdomen: soft, non-tender; bowel sounds normal; no masses,  no organomegaly Extremities: extremities normal, atraumatic, no cyanosis or edema Pulses: 2+ and symmetric Skin: Skin color, texture, turgor normal. No rashes or lesions Neurologic: Grossly normal Psych: Pleasant  EKG: Sinus rhythm first-degree AV block at 60, minimal voltage criteria for  LVH-personally reviewed  ASSESSMENT: Paroxysmal atrial fibrillation on warfarin - CHADSVASC score of 4 Recent COVID-19 infection (04/2019) Hypertension-controlled Dyslipidemia-on statin Overweight Moderate, non-obstructive CAD by cath in 2012 PSVT - controlled by b-blocker and vagal maneuvers LVH, mild to moderate TR by echo 2012  PLAN: 1.   Ms. Hilton only had worsening palpitations and was noted to have some runs of SVT on her monitor.  I advised further increase of her Toprol up to 100 mg twice daily.  She is remain however on 75 mg twice daily as her symptoms have improved and she had some side effects when trying the higher doses.  For now we will continue this current dose.  Plan follow-up with me annually or sooner as necessary.  Pixie Casino, MD, [facility], [place] Director of the [facility]  [facility] Diplomate of the American Board of Clinical Lipidology Attending Cardiologist  Direct Dial: [phone]  Fax: [phone]  Website:  [email or web address] [email or web address] Marshawn Normoyle 09/27/2020, 2:58 PM

## 2020-10-01 ENCOUNTER — Ambulatory Visit (INDEPENDENT_AMBULATORY_CARE_PROVIDER_SITE_OTHER): Payer: Medicare HMO | Admitting: Internal Medicine

## 2020-10-01 ENCOUNTER — Ambulatory Visit (INDEPENDENT_AMBULATORY_CARE_PROVIDER_SITE_OTHER): Payer: Medicare HMO

## 2020-10-01 ENCOUNTER — Other Ambulatory Visit: Payer: Self-pay

## 2020-10-01 ENCOUNTER — Encounter: Payer: Self-pay | Admitting: Internal Medicine

## 2020-10-01 VITALS — BP 172/90 | HR 65 | Temp 98.4°F | Ht 66.0 in | Wt 182.0 lb

## 2020-10-01 DIAGNOSIS — S61512A Laceration without foreign body of left wrist, initial encounter: Secondary | ICD-10-CM | POA: Diagnosis not present

## 2020-10-01 DIAGNOSIS — M1612 Unilateral primary osteoarthritis, left hip: Secondary | ICD-10-CM | POA: Diagnosis not present

## 2020-10-01 DIAGNOSIS — M25552 Pain in left hip: Secondary | ICD-10-CM | POA: Diagnosis not present

## 2020-10-01 DIAGNOSIS — I10 Essential (primary) hypertension: Secondary | ICD-10-CM | POA: Diagnosis not present

## 2020-10-01 DIAGNOSIS — W19XXXA Unspecified fall, initial encounter: Secondary | ICD-10-CM

## 2020-10-01 DIAGNOSIS — S60219A Contusion of unspecified wrist, initial encounter: Secondary | ICD-10-CM | POA: Insufficient documentation

## 2020-10-01 DIAGNOSIS — S60212A Contusion of left wrist, initial encounter: Secondary | ICD-10-CM

## 2020-10-01 DIAGNOSIS — Z23 Encounter for immunization: Secondary | ICD-10-CM | POA: Diagnosis not present

## 2020-10-01 DIAGNOSIS — M19032 Primary osteoarthritis, left wrist: Secondary | ICD-10-CM | POA: Diagnosis not present

## 2020-10-01 MED ORDER — TRAMADOL HCL 50 MG PO TABS: 50.0000 mg | ORAL_TABLET | Freq: Four times a day (QID) | ORAL | 0 refills | Status: DC | PRN

## 2020-10-01 NOTE — Assessment & Plan Note (Addendum)
  The pt tripped and fell on the driveway on 1/65/80 on the L side. No LOC. Will observe Tdap

## 2020-10-01 NOTE — Addendum Note (Signed)
Addended by: Earnstine Regal on: 10/01/2020 11:30 AM   Modules accepted: Orders

## 2020-10-01 NOTE — Assessment & Plan Note (Signed)
Worse Cont w/Metoprolol, Norvasc Treat pain

## 2020-10-01 NOTE — Progress Notes (Signed)
Subjective:  Patient ID: [name], female    DOB: 1935-03-02  Age: 86 y.o. MRN: [id number]  CC: Hip Pain (Pt states she fell yesterday. She tripped over a rock)   HPI [name] presents for L hip pain  C/o R knee pain The pt tripped and fell on the driveway on 1/44/81 on the L side. No LOC Outpatient Medications Prior to Visit  Medication Sig Dispense Refill   ACCU-CHEK AVIVA PLUS test strip USE TO CHECK BLOOD SUGARS DAILY 100 strip 2   Accu-Chek Softclix Lancets lancets USE  AS  INSTRUCTED 100 each 2   Alcohol Swabs (B-D SINGLE USE SWABS REGULAR) PADS USE TO CLEAN INJECTION SITE TO CHECK BLOOD SUGARS DAILY 100 each 2   ALPRAZolam (XANAX) 0.5 MG tablet TAKE 1/2 TO 1 TABLET BY MOUTH THREE TIMES DAILY AS NEEDED FOR ANXIETY. DO NOT TAKE WITH TRAMADOL.THIS IS A 30 DAY SUPPLY 90 tablet 1   amLODipine (NORVASC) 5 MG tablet TAKE 1 TABLET(5 MG) BY MOUTH DAILY 90 tablet 3   aspirin 81 MG tablet Take 81 mg by mouth daily.     Cholecalciferol (VITAMIN D3) 50 MCG (2000 UT) capsule TAKE 1 CAPSULE BY MOUTH ONCE DAILY 100 capsule 3   diazepam (VALIUM) 5 MG tablet Take by mouth.     diclofenac Sodium (VOLTAREN) 1 % GEL Apply 2 g topically 4 (four) times daily. 150 g 1   furosemide (LASIX) 20 MG tablet TAKE 1 TABLET(20 MG) BY MOUTH DAILY AS NEEDED FOR SWELLING (Patient taking differently: Take 20 mg by mouth daily as needed for edema (swelling).) 30 tablet 3   hydroxypropyl methylcellulose (ISOPTO TEARS) 2.5 % ophthalmic solution Place 1 drop into both eyes 2 (two) times daily.     hydrOXYzine (ATARAX/VISTARIL) 25 MG tablet Take 1 tablet (25 mg total) by mouth every 8 (eight) hours as needed for itching. 60 tablet 0   isosorbide mononitrate (IMDUR) 30 MG 24 hr tablet TAKE 1 TABLET BY MOUTH EVERY DAY 90 tablet 3   metoprolol succinate (TOPROL-XL) 50 MG 24 hr tablet Takes 1&1/2 tablets twice a day 90 tablet 6   pantoprazole (PROTONIX) 40 MG tablet TAKE 1 TABLET BY MOUTH EVERY DAY 90 tablet 3    simvastatin (ZOCOR) 20 MG tablet TAKE 1 TABLET BY MOUTH AT BEDTIME 90 tablet 1   traMADol (ULTRAM) 50 MG tablet Take 1 tablet (50 mg total) by mouth 3 (three) times daily. 90 tablet 3   warfarin (COUMADIN) 5 MG tablet TAKE 1 TABLET BY MOUTH EVERY DAY AS DIRECTED BY COUMADIN CLINIC 90 tablet 0   hyoscyamine (LEVSIN) 0.125 MG tablet Take 1 tablet (0.125 mg total) by mouth every 6 (six) hours as needed for up to 10 days for cramping. (Patient not taking: Reported on 09/27/2020) 100 tablet 3   No facility-administered medications prior to visit.    ROS: Review of Systems  Constitutional:  Negative for activity change, appetite change, chills, diaphoresis, fatigue, fever and unexpected weight change.  HENT:  Negative for congestion, dental problem, ear pain, hearing loss, mouth sores, postnasal drip, sinus pressure, sneezing, sore throat and voice change.   Eyes:  Negative for pain and visual disturbance.  Respiratory:  Negative for cough, chest tightness, wheezing and stridor.   Cardiovascular:  Negative for chest pain, palpitations and leg swelling.  Gastrointestinal:  Negative for abdominal distention, abdominal pain, blood in stool, nausea, rectal pain and vomiting.  Genitourinary:  Negative for difficulty urinating, dysuria, frequency, hematuria and  menstrual problem.  Musculoskeletal:  Positive for arthralgias, back pain and gait problem. Negative for joint swelling and neck pain.  Skin:  Positive for wound. Negative for color change and rash.  Neurological:  Positive for numbness. Negative for dizziness, tremors, syncope, facial asymmetry, speech difficulty, weakness, light-headedness and headaches.  Hematological:  Negative for adenopathy.  Psychiatric/Behavioral:  Negative for behavioral problems, confusion, decreased concentration, hallucinations, sleep disturbance and suicidal ideas. The patient is not nervous/anxious and is not hyperactive.    Objective:  BP (!) 172/90 (BP Location:  Left Arm)   Pulse 65   Temp 98.4 F (36.9 C) (Oral)   Ht 5\' 6"  (1.676 m)   Wt 182 lb (82.6 kg)   SpO2 97%   BMI 29.38 kg/m   BP Readings from Last 3 Encounters:  10/01/20 (!) 172/90  09/27/20 (!) 144/82  07/12/20 130/60    Wt Readings from Last 3 Encounters:  10/01/20 182 lb (82.6 kg)  09/27/20 182 lb (82.6 kg)  07/12/20 178 lb 9.6 oz (81 kg)    Physical Exam Constitutional:      General: She is not in acute distress.    Appearance: She is well-developed. She is obese.  HENT:     Head: Normocephalic.     Right Ear: External ear normal.     Left Ear: External ear normal.     Nose: Nose normal.  Eyes:     General:        Right eye: No discharge.        Left eye: No discharge.     Conjunctiva/sclera: Conjunctivae normal.     Pupils: Pupils are equal, round, and reactive to light.  Neck:     Thyroid: No thyromegaly.     Vascular: No JVD.     Trachea: No tracheal deviation.  Cardiovascular:     Rate and Rhythm: Normal rate and regular rhythm.     Heart sounds: Normal heart sounds.  Pulmonary:     Effort: No respiratory distress.     Breath sounds: No stridor. No wheezing.  Abdominal:     General: Bowel sounds are normal. There is no distension.     Palpations: Abdomen is soft. There is no mass.     Tenderness: There is no abdominal tenderness. There is no guarding or rebound.  Musculoskeletal:        General: Tenderness present.     Cervical back: Normal range of motion and neck supple. No rigidity.  Lymphadenopathy:     Cervical: No cervical adenopathy.  Skin:    Findings: No erythema or rash.  Neurological:     Cranial Nerves: No cranial nerve deficit.     Motor: No abnormal muscle tone.     Coordination: Coordination normal.     Deep Tendon Reflexes: Reflexes normal.  Psychiatric:        Behavior: Behavior normal.        Thought Content: Thought content normal.        Judgment: Judgment normal.   L hip w/swelling/pain L wrist  w/pain/swelling/scratches Using a cane R knee w/o swelling/pain   Lab Results  Component Value Date   WBC 6.4 06/23/2020   HGB 12.3 06/23/2020   HCT 38.6 06/23/2020   PLT 243 06/23/2020   GLUCOSE 105 (H) 07/12/2020   CHOL 176 11/18/2018   TRIG 75.0 11/18/2018   HDL 72.20 11/18/2018   LDLDIRECT 105.5 11/26/2010   LDLCALC 89 11/18/2018   ALT 10 07/12/2020   AST 15  07/12/2020   NA 138 07/12/2020   K 3.8 07/12/2020   CL 102 07/12/2020   CREATININE 1.14 07/12/2020   BUN 16 07/12/2020   CO2 31 07/12/2020   TSH 0.856 05/24/2020   INR 4.2 (A) 09/27/2020   HGBA1C 7.1 (H) 03/28/2020    DG Chest 2 View  Result Date: 06/23/2020 CLINICAL DATA:  Palpitations, hypertension EXAM: CHEST - 2 VIEW COMPARISON:  05/24/2020 FINDINGS: The heart size and mediastinal contours are within normal limits. Both lungs are clear. The visualized skeletal structures are unremarkable. IMPRESSION: No active cardiopulmonary disease. Electronically Signed   By: Randa Ngo M.D.   On: 06/23/2020 01:18    Assessment & Plan:    Walker Kehr, MD

## 2020-10-01 NOTE — Assessment & Plan Note (Signed)
  The pt tripped and fell on the driveway on 07/11/90 on the L side.  X ray Pain meds prn

## 2020-10-01 NOTE — Assessment & Plan Note (Addendum)
  The pt tripped and fell on the driveway on 4/92/01 on the L side. No LOC L wrist X ray ACE wrap Wound is dressed Tramadol prn  Potential benefits of a long term opioids use as well as potential risks (i.e. addiction risk, apnea etc) and complications (i.e. Somnolence, constipation and others) were explained to the patient and were aknowledged.

## 2020-10-08 ENCOUNTER — Telehealth: Payer: Self-pay | Admitting: Internal Medicine

## 2020-10-08 NOTE — Telephone Encounter (Signed)
Please call to discuss xray results

## 2020-10-08 NOTE — Telephone Encounter (Signed)
Called pt she states she was confuse about the message concerning the hardening of the blood vessel walls. Wanting to know what that meant. Inform pt that the medial term is " Atherosclerosis" and it means that she has cholesterol (plaque) build up in her arteries. [name], and which she states she is. [name]. Is there anything she can take.[name]

## 2020-10-09 NOTE — Telephone Encounter (Signed)
Continue with the baby aspirin and simvastatin daily.  This is all she needs at present.  Thank you

## 2020-10-09 NOTE — Telephone Encounter (Signed)
Notified pt w/MD response../l,mb 

## 2020-10-10 ENCOUNTER — Other Ambulatory Visit: Payer: Self-pay

## 2020-10-10 ENCOUNTER — Ambulatory Visit (INDEPENDENT_AMBULATORY_CARE_PROVIDER_SITE_OTHER): Payer: Medicare HMO | Admitting: Pharmacist Clinician (PhC)/ Clinical Pharmacy Specialist

## 2020-10-10 DIAGNOSIS — I059 Rheumatic mitral valve disease, unspecified: Secondary | ICD-10-CM | POA: Diagnosis not present

## 2020-10-10 DIAGNOSIS — I480 Paroxysmal atrial fibrillation: Secondary | ICD-10-CM | POA: Diagnosis not present

## 2020-10-10 DIAGNOSIS — Z7901 Long term (current) use of anticoagulants: Secondary | ICD-10-CM

## 2020-10-10 LAB — POCT INR: INR: 3.6 — AB (ref 2.0–3.0)

## 2020-10-11 ENCOUNTER — Other Ambulatory Visit: Payer: Self-pay | Admitting: Internal Medicine

## 2020-10-24 ENCOUNTER — Ambulatory Visit (INDEPENDENT_AMBULATORY_CARE_PROVIDER_SITE_OTHER): Payer: Medicare HMO

## 2020-10-24 ENCOUNTER — Other Ambulatory Visit: Payer: Self-pay

## 2020-10-24 DIAGNOSIS — I480 Paroxysmal atrial fibrillation: Secondary | ICD-10-CM

## 2020-10-24 DIAGNOSIS — Z7901 Long term (current) use of anticoagulants: Secondary | ICD-10-CM | POA: Diagnosis not present

## 2020-10-24 LAB — POCT INR: INR: 2.3 (ref 2.0–3.0)

## 2020-10-24 NOTE — Patient Instructions (Signed)
Continue taking 1 tablet daily except 1/2 tablet each Sunday.  Repeat INR in 6 weeks.  *Consistent amount of greens every week*

## 2020-10-25 ENCOUNTER — Encounter: Payer: Self-pay | Admitting: Internal Medicine

## 2020-10-25 ENCOUNTER — Ambulatory Visit (INDEPENDENT_AMBULATORY_CARE_PROVIDER_SITE_OTHER): Payer: Medicare HMO | Admitting: Internal Medicine

## 2020-10-25 VITALS — BP 130/62 | HR 55 | Temp 98.0°F | Ht 66.0 in | Wt 184.8 lb

## 2020-10-25 DIAGNOSIS — E785 Hyperlipidemia, unspecified: Secondary | ICD-10-CM | POA: Diagnosis not present

## 2020-10-25 DIAGNOSIS — M2550 Pain in unspecified joint: Secondary | ICD-10-CM | POA: Diagnosis not present

## 2020-10-25 DIAGNOSIS — I700 Atherosclerosis of aorta: Secondary | ICD-10-CM | POA: Diagnosis not present

## 2020-10-25 DIAGNOSIS — Z0000 Encounter for general adult medical examination without abnormal findings: Secondary | ICD-10-CM

## 2020-10-25 NOTE — Assessment & Plan Note (Signed)
Hold Simvastatin x2-3 wks to see if weakness, pain is better

## 2020-10-25 NOTE — Assessment & Plan Note (Signed)
On Simvastatin 

## 2020-10-25 NOTE — Progress Notes (Signed)
Subjective:  Patient ID: [name], female    DOB: [birth year]  Age: 86 y.o. MRN: [id number]  CC: Annual Exam   HPI SHANDON MATSON presents for a well exam C/o stiffness, unsteady gait - s/p fall on 6/20 - better  Outpatient Medications Prior to Visit  Medication Sig Dispense Refill   ACCU-CHEK AVIVA PLUS test strip USE TO CHECK BLOOD SUGARS DAILY 100 strip 2   Accu-Chek Softclix Lancets lancets USE  AS  INSTRUCTED 100 each 2   Alcohol Swabs (B-D SINGLE USE SWABS REGULAR) PADS USE TO CLEAN INJECTION SITE TO CHECK BLOOD SUGARS DAILY 100 each 2   ALPRAZolam (XANAX) 0.5 MG tablet TAKE 1/2 TO 1 TABLET BY MOUTH THREE TIMES DAILY AS NEEDED FOR ANXIETY. DO NOT TAKE WITH TRAMADOL.THIS IS A 30 DAY SUPPLY 90 tablet 1   amLODipine (NORVASC) 5 MG tablet TAKE 1 TABLET(5 MG) BY MOUTH DAILY 90 tablet 3   aspirin 81 MG tablet Take 81 mg by mouth daily.     Cholecalciferol (VITAMIN D3) 50 MCG (2000 UT) capsule TAKE 1 CAPSULE BY MOUTH ONCE DAILY 100 capsule 3   diazepam (VALIUM) 5 MG tablet Take by mouth.     diclofenac Sodium (VOLTAREN) 1 % GEL Apply 2 g topically 4 (four) times daily. 150 g 1   furosemide (LASIX) 20 MG tablet TAKE 1 TABLET(20 MG) BY MOUTH DAILY AS NEEDED FOR SWELLING (Patient taking differently: Take 20 mg by mouth daily as needed for edema (swelling).) 30 tablet 3   hydroxypropyl methylcellulose (ISOPTO TEARS) 2.5 % ophthalmic solution Place 1 drop into both eyes 2 (two) times daily.     hydrOXYzine (ATARAX/VISTARIL) 25 MG tablet Take 1 tablet (25 mg total) by mouth every 8 (eight) hours as needed for itching. 60 tablet 0   isosorbide mononitrate (IMDUR) 30 MG 24 hr tablet TAKE 1 TABLET BY MOUTH EVERY DAY 90 tablet 3   metoprolol succinate (TOPROL-XL) 50 MG 24 hr tablet Takes 1&1/2 tablets twice a day 90 tablet 6   pantoprazole (PROTONIX) 40 MG tablet TAKE 1 TABLET BY MOUTH EVERY DAY 90 tablet 3   simvastatin (ZOCOR) 20 MG tablet TAKE 1 TABLET BY MOUTH AT BEDTIME 90 tablet 1    traMADol (ULTRAM) 50 MG tablet Take 1 tablet (50 mg total) by mouth every 6 (six) hours as needed. 20 tablet 0   warfarin (COUMADIN) 5 MG tablet TAKE 1 TABLET BY MOUTH EVERY DAY AS DIRECTED BY COUMADIN CLINIC 90 tablet 0   hyoscyamine (LEVSIN) 0.125 MG tablet Take 1 tablet (0.125 mg total) by mouth every 6 (six) hours as needed for up to 10 days for cramping. (Patient not taking: No sig reported) 100 tablet 3   No facility-administered medications prior to visit.    ROS: Review of Systems  Constitutional:  Positive for fatigue. Negative for activity change, appetite change, chills and unexpected weight change.  HENT:  Negative for congestion, mouth sores and sinus pressure.   Eyes:  Negative for visual disturbance.  Respiratory:  Negative for cough and chest tightness.   Gastrointestinal:  Negative for abdominal pain and nausea.  Genitourinary:  Negative for difficulty urinating, frequency and vaginal pain.  Musculoskeletal:  Positive for arthralgias and gait problem. Negative for back pain.  Skin:  Negative for pallor and rash.  Neurological:  Negative for dizziness, tremors, weakness, numbness and headaches.  Psychiatric/Behavioral:  Positive for sleep disturbance. Negative for confusion and suicidal ideas. The patient is not nervous/anxious.  Objective:  BP 130/62 (BP Location: Left Arm)   Pulse (!) 55   Temp 98 F (36.7 C) (Oral)   Ht 5\' 6"  (1.676 m)   Wt 184 lb 12.8 oz (83.8 kg)   SpO2 95%   BMI 29.83 kg/m   BP Readings from Last 3 Encounters:  10/25/20 130/62  10/01/20 (!) 172/90  09/27/20 (!) 144/82    Wt Readings from Last 3 Encounters:  10/25/20 184 lb 12.8 oz (83.8 kg)  10/01/20 182 lb (82.6 kg)  09/27/20 182 lb (82.6 kg)    Physical Exam Constitutional:      General: She is not in acute distress.    Appearance: She is well-developed. She is obese.  HENT:     Head: Normocephalic.     Right Ear: External ear normal.     Left Ear: External ear normal.      Nose: Nose normal.  Eyes:     General:        Right eye: No discharge.        Left eye: No discharge.     Conjunctiva/sclera: Conjunctivae normal.     Pupils: Pupils are equal, round, and reactive to light.  Neck:     Thyroid: No thyromegaly.     Vascular: No JVD.     Trachea: No tracheal deviation.  Cardiovascular:     Rate and Rhythm: Normal rate and regular rhythm.     Heart sounds: Normal heart sounds.  Pulmonary:     Effort: No respiratory distress.     Breath sounds: No stridor. No wheezing.  Abdominal:     General: Bowel sounds are normal. There is no distension.     Palpations: Abdomen is soft. There is no mass.     Tenderness: There is no abdominal tenderness. There is no guarding or rebound.  Musculoskeletal:        General: No tenderness.     Cervical back: Normal range of motion and neck supple. No rigidity.  Lymphadenopathy:     Cervical: No cervical adenopathy.  Skin:    Findings: No erythema or rash.  Neurological:     Cranial Nerves: No cranial nerve deficit.     Motor: No abnormal muscle tone.     Coordination: Coordination abnormal.     Gait: Gait abnormal.     Deep Tendon Reflexes: Reflexes normal.  Psychiatric:        Behavior: Behavior normal.        Thought Content: Thought content normal.        Judgment: Judgment normal.  Stiff Using a caneSlow Romberg (-) No pronator drift  Lab Results  Component Value Date   WBC 6.4 06/23/2020   HGB 12.3 06/23/2020   HCT 38.6 06/23/2020   PLT 243 06/23/2020   GLUCOSE 105 (H) 07/12/2020   CHOL 176 11/18/2018   TRIG 75.0 11/18/2018   HDL 72.20 11/18/2018   LDLDIRECT 105.5 11/26/2010   LDLCALC 89 11/18/2018   ALT 10 07/12/2020   AST 15 07/12/2020   NA 138 07/12/2020   K 3.8 07/12/2020   CL 102 07/12/2020   CREATININE 1.14 07/12/2020   BUN 16 07/12/2020   CO2 31 07/12/2020   TSH 0.856 05/24/2020   INR 2.3 10/24/2020   HGBA1C 7.1 (H) 03/28/2020    DG Chest 2 View  Result Date:  06/23/2020 CLINICAL DATA:  Palpitations, hypertension EXAM: CHEST - 2 VIEW COMPARISON:  05/24/2020 FINDINGS: The heart size and mediastinal contours are within normal limits.  Both lungs are clear. The visualized skeletal structures are unremarkable. IMPRESSION: No active cardiopulmonary disease. Electronically Signed   By: Randa Ngo M.D.   On: 06/23/2020 01:18    Assessment & Plan:     Walker Kehr, MD

## 2020-10-25 NOTE — Patient Instructions (Addendum)
Valerian root for insomnia  Hold Simvastatin x 2-3 wks to see if weakness, pain is better

## 2020-10-25 NOTE — Assessment & Plan Note (Addendum)
Check CK Hold Simvastatin x3 wks PT if not beter

## 2020-10-25 NOTE — Assessment & Plan Note (Addendum)
  We discussed age appropriate health related issues, including available/recomended screening tests and vaccinations. Labs were ordered to be later reviewed . All questions were answered. We discussed one or more of the following - seat belt use, use of sunscreen/sun exposure exercise, fall risk reduction, second hand smoke exposure, firearm use and storage, seat belt use, a need for adhering to healthy diet and exercise. Labs were ordered.  All questions were answered.  Pt refused all shots Had a mammo Pt declined all vaccines

## 2020-10-26 ENCOUNTER — Other Ambulatory Visit: Payer: Self-pay | Admitting: Internal Medicine

## 2020-10-26 DIAGNOSIS — Z1231 Encounter for screening mammogram for malignant neoplasm of breast: Secondary | ICD-10-CM

## 2020-10-29 ENCOUNTER — Other Ambulatory Visit (INDEPENDENT_AMBULATORY_CARE_PROVIDER_SITE_OTHER): Payer: Medicare HMO

## 2020-10-29 ENCOUNTER — Other Ambulatory Visit: Payer: Self-pay

## 2020-10-29 DIAGNOSIS — M2550 Pain in unspecified joint: Secondary | ICD-10-CM | POA: Diagnosis not present

## 2020-10-29 DIAGNOSIS — I700 Atherosclerosis of aorta: Secondary | ICD-10-CM | POA: Diagnosis not present

## 2020-10-29 DIAGNOSIS — E785 Hyperlipidemia, unspecified: Secondary | ICD-10-CM | POA: Diagnosis not present

## 2020-10-29 DIAGNOSIS — Z0000 Encounter for general adult medical examination without abnormal findings: Secondary | ICD-10-CM

## 2020-10-29 LAB — CBC WITH DIFFERENTIAL/PLATELET
Basophils Absolute: 0 10*3/uL (ref 0.0–0.1)
Basophils Relative: 0.6 % (ref 0.0–3.0)
Eosinophils Absolute: 0.2 10*3/uL (ref 0.0–0.7)
Eosinophils Relative: 3.8 % (ref 0.0–5.0)
HCT: 35.4 % — ABNORMAL LOW (ref 36.0–46.0)
Hemoglobin: 11.9 g/dL — ABNORMAL LOW (ref 12.0–15.0)
Lymphocytes Relative: 32.2 % (ref 12.0–46.0)
Lymphs Abs: 1.4 10*3/uL (ref 0.7–4.0)
MCHC: 33.6 g/dL (ref 30.0–36.0)
MCV: 88 fl (ref 78.0–100.0)
Monocytes Absolute: 0.4 10*3/uL (ref 0.1–1.0)
Monocytes Relative: 8.9 % (ref 3.0–12.0)
Neutro Abs: 2.4 10*3/uL (ref 1.4–7.7)
Neutrophils Relative %: 54.5 % (ref 43.0–77.0)
Platelets: 202 10*3/uL (ref 150.0–400.0)
RBC: 4.02 Mil/uL (ref 3.87–5.11)
RDW: 14.6 % (ref 11.5–15.5)
WBC: 4.4 10*3/uL (ref 4.0–10.5)

## 2020-10-29 LAB — URINALYSIS, ROUTINE W REFLEX MICROSCOPIC
Bilirubin Urine: NEGATIVE
Ketones, ur: NEGATIVE
Leukocytes,Ua: NEGATIVE
Nitrite: NEGATIVE
Specific Gravity, Urine: 1.025 (ref 1.000–1.030)
Total Protein, Urine: NEGATIVE
Urine Glucose: NEGATIVE
Urobilinogen, UA: 0.2 (ref 0.0–1.0)
WBC, UA: NONE SEEN (ref 0–?)
pH: 5.5 (ref 5.0–8.0)

## 2020-10-29 LAB — COMPREHENSIVE METABOLIC PANEL
ALT: 10 U/L (ref 0–35)
AST: 16 U/L (ref 0–37)
Albumin: 4.1 g/dL (ref 3.5–5.2)
Alkaline Phosphatase: 83 U/L (ref 39–117)
BUN: 18 mg/dL (ref 6–23)
CO2: 28 mEq/L (ref 19–32)
Calcium: 9.2 mg/dL (ref 8.4–10.5)
Chloride: 105 mEq/L (ref 96–112)
Creatinine, Ser: 1.21 mg/dL — ABNORMAL HIGH (ref 0.40–1.20)
GFR: 41.28 mL/min — ABNORMAL LOW (ref >60.00)
Glucose, Bld: 122 mg/dL — ABNORMAL HIGH (ref 70–99)
Potassium: 4.2 mEq/L (ref 3.5–5.1)
Sodium: 138 mEq/L (ref 135–145)
Total Bilirubin: 0.4 mg/dL (ref 0.2–1.2)
Total Protein: 6.8 g/dL (ref 6.0–8.3)

## 2020-10-29 LAB — TSH: TSH: 1.39 u[IU]/mL (ref 0.35–5.50)

## 2020-10-29 LAB — CK: Total CK: 115 U/L (ref 7–177)

## 2020-10-29 LAB — LIPID PANEL
Cholesterol: 168 mg/dL (ref 0–200)
HDL: 66.4 mg/dL (ref >39.00)
LDL Cholesterol: 85 mg/dL (ref 0–99)
NonHDL: 101.44
Total CHOL/HDL Ratio: 3
Triglycerides: 82 mg/dL (ref 0.0–149.0)
VLDL: 16.4 mg/dL (ref 0.0–40.0)

## 2020-11-02 ENCOUNTER — Ambulatory Visit
Admission: RE | Admit: 2020-11-02 | Discharge: 2020-11-02 | Disposition: A | Discharge status: Home / Self Care | Payer: Medicare HMO | Source: Ambulatory Visit | Attending: Internal Medicine | Admitting: Internal Medicine

## 2020-11-02 ENCOUNTER — Other Ambulatory Visit: Payer: Self-pay

## 2020-11-02 DIAGNOSIS — Z1231 Encounter for screening mammogram for malignant neoplasm of breast: Secondary | ICD-10-CM

## 2020-11-08 ENCOUNTER — Telehealth: Payer: Self-pay

## 2020-11-08 DIAGNOSIS — M47816 Spondylosis without myelopathy or radiculopathy, lumbar region: Secondary | ICD-10-CM

## 2020-11-08 DIAGNOSIS — M2550 Pain in unspecified joint: Secondary | ICD-10-CM

## 2020-11-08 DIAGNOSIS — M25552 Pain in left hip: Secondary | ICD-10-CM

## 2020-11-08 DIAGNOSIS — W19XXXS Unspecified fall, sequela: Secondary | ICD-10-CM

## 2020-11-08 NOTE — Telephone Encounter (Signed)
pt has stated Dr. Alain Marion told her if she wanted to try PT for her leg to call the office. Pt states she is willing to give PT a try. She asked that it be at a office near her home.

## 2020-11-12 NOTE — Telephone Encounter (Signed)
Okay.  Done.  Thanks 

## 2020-11-12 NOTE — Addendum Note (Signed)
Addended by: Cassandria Anger on: 11/12/2020 05:46 PM   Modules accepted: Orders

## 2020-11-13 NOTE — Telephone Encounter (Signed)
Notified pt MD has placed referral for PT.Marland KitchenJohny Chess

## 2020-11-14 ENCOUNTER — Telehealth: Payer: Self-pay | Admitting: Internal Medicine

## 2020-11-22 ENCOUNTER — Other Ambulatory Visit: Payer: Self-pay | Admitting: Internal Medicine

## 2020-11-28 ENCOUNTER — Telehealth: Payer: Self-pay | Admitting: Lab

## 2020-11-28 NOTE — Chronic Care Management (AMB) (Signed)
  Chronic Care Management   Note  11/28/2020 Name: [name] MRN: [id number] DOB: [birth year]  [name] is a 86 y.o. year old female who is a primary care patient of Plotnikov, Evie Lacks, MD. I reached out to [name] by phone today in response to a referral sent by [name]'s PCP, Plotnikov, Evie Lacks, MD.   [name] was given information about Chronic Care Management services today including:  CCM service includes personalized support from designated clinical staff supervised by her physician, including individualized plan of care and coordination with other care providers 24/7 contact phone numbers for assistance for urgent and routine care needs. Service will only be billed when office clinical staff spend 20 minutes or more in a month to coordinate care. Only one practitioner may furnish and bill the service in a calendar month. The patient may stop CCM services at any time (effective at the end of the month) by phone call to the office staff.   Patient agreed to services and verbal consent obtained.   Follow up plan:   [place]

## 2020-12-06 ENCOUNTER — Other Ambulatory Visit: Payer: Self-pay

## 2020-12-06 ENCOUNTER — Ambulatory Visit: Payer: Medicare HMO | Attending: Internal Medicine

## 2020-12-06 DIAGNOSIS — R262 Difficulty in walking, not elsewhere classified: Secondary | ICD-10-CM

## 2020-12-06 DIAGNOSIS — M6281 Muscle weakness (generalized): Secondary | ICD-10-CM | POA: Diagnosis not present

## 2020-12-06 DIAGNOSIS — Z9181 History of falling: Secondary | ICD-10-CM

## 2020-12-06 DIAGNOSIS — M25552 Pain in left hip: Secondary | ICD-10-CM | POA: Diagnosis not present

## 2020-12-06 NOTE — Therapy (Signed)
[place], [place], [address] Phone: [phone]   Fax:  [phone]  Physical Therapy Evaluation  Patient Details  Name: [name] MRN: [id number] Date of Birth: Jul 03, 1934 Referring Provider (PT): Plotnikov, Evie Lacks, MD   Encounter Date: 12/06/2020   PT End of Session - 12/06/20 1019     Visit Number 1    Number of Visits 7    Date for PT Re-Evaluation 01/19/21    Authorization Type [employer] - requesting auth    PT Start Time 1019   patient late   PT Stop Time 1058    PT Time Calculation (min) 39 min    Activity Tolerance Patient tolerated treatment well    Behavior During Therapy [facility] for tasks assessed/performed             Past Medical History:  Diagnosis Date   Anxiety state, unspecified    Bronchitis, not specified as acute or chronic    Diaphragmatic hernia without mention of obstruction or gangrene    Dyslipidemia    Essential hypertension    Irritable bowel syndrome    Mitral valve disorders(424.0)    a. 08/2010 Echo: EF >55%, mild MR, mod TR, trace AI.   Non-obstructive CAD    a. 07/2010 Lexiscan MV: no ischemia, EF 78%; b. 07/2010 Cath: LM nl, LAD50/60p, 40 apical, LCX nl, RCA dominant, 30/40p, 28m   Osteoarthrosis, unspecified whether generalized or localized, unspecified site    Osteoporosis, unspecified    Other abnormal glucose    PAF (paroxysmal atrial fibrillation) (HCC)    a. CHA2DS2VASc = 4-->coumadin.   Phlebitis and thrombophlebitis of superficial vessels of lower extremities    PSVT (paroxysmal supraventricular tachycardia) ([place]    a. 05/2012 Holter: short bursts of PSVT noted-->Managed with toprol.   Unspecified venous (peripheral) insufficiency    Unspecified vitamin D deficiency     Past Surgical History:  Procedure Laterality Date   CARDIAC CATHETERIZATION  07/30/2010   nonobstructive CAD, 20-40% RCA stenosis, 50-60% eccentric LAD stenosis more prox, 40%  LAD stenosis more distal, EF 65%   TRANSTHORACIC ECHOCARDIOGRAM  09/03/2010   EF=>55%; mild MR; mod TR; AV mildly sclerotic with trace regurg; mild pulm valve regurg   VESICOVAGINAL FISTULA CLOSURE W/ TAH      There were no vitals filed for this visit.    Subjective Assessment - 12/06/20 1020     Subjective She reports having a fall in June where she fell on the driveway. She reports having some Lt groin and lateral hip pain since the fall, but it is a lot better. She is occasionally using a cane. She is unsure how she fell though does not recall feeling dizzy or tripping over anything. She denies pain currently. She reports pain at worst 4/10 with prolonged walking and standing. She uses topical ointment and ice/heat, which helps with her pain.    How long can you sit comfortably? sitting is ok    How long can you stand comfortably? 5-10 minutes    How long can you walk comfortably? household distances    Diagnostic tests Hip X-ray IMPRESSION:  1. Diffuse osteopenia and degenerative change. No acute abnormality.     2.  Peripheral vascular disease.    Patient Stated Goals I just want to see what's going on with my hip    Currently in Pain? No/denies                [facility]  [facility] - 12[facility]20 0001       Assessment   Medical Diagnosis M25.50 (ICD-10-CM) - Arthralgia, unspecified joint  M25.552 (ICD-10-CM) - Hip pain, acute, left  M47.816 (ICD-10-CM) - Osteoarthritis of lumbar spine, unspecified spinal osteoarthritis complication status  [id number] Miles (ICD-10-CM) - Fall, sequela    Referring Provider (PT) Plotnikov, Evie Lacks, MD    Onset Date/Surgical Date 09/30/20    Hand Dominance Left    Next MD Visit 01/29/21    Prior Therapy no      Precautions   Precautions Fall      Restrictions   Weight Bearing Restrictions No      Balance Screen   Has the patient fallen in the past 6 months Yes    How many times? 1    Has the patient had a decrease in activity level because of a  fear of falling?  Yes    Is the patient reluctant to leave their home because of a fear of falling?  No      Home Environment   Living Environment Private residence    Living Arrangements Children    Type of [place]    Additional Comments no stairs      Prior Function   Level of [place] Retired    Leisure go to the [employer]   Overall Cognitive Status --   slow at processing questions     Observation/Other Assessments   Focus on Therapeutic Outcomes (FOTO)  no time at eval to capture      Sensation   Light Touch Not tested      Coordination   Gross Motor Movements are Fluid and Coordinated Yes      AROM   Overall AROM Comments Lt hip flexion 80 with pain      PROM   Overall PROM Comments pain with Lt passive adduction      Strength   Right Hip Flexion 4-/5    Right Hip ABduction 3/5    Left Hip Flexion 4-/5    Left Hip ABduction 3-/5    Right Knee Flexion 5/5    Right Knee Extension 5/5    Left Knee Flexion 5/5    Left Knee Extension 5/5      Palpation   Palpation comment TTP Lt adductors      Transfers   Five time sit to stand comments  22.9      Ambulation/Gait   Ambulation/Gait Yes    Gait Comments wide BOS, foot flat initial contact, limited hip flexion/extension throughout gait cycle      Standardized Balance Assessment   Standardized Balance Assessment Berg Balance Test      Berg Balance Test   Sit to Stand Able to stand without using hands and stabilize independently    Standing Unsupported Able to stand safely 2 minutes    Sitting with Back Unsupported but Feet Supported on Floor or Stool Able to sit safely and securely 2 minutes    Stand to Sit Sits safely with minimal use of hands    Transfers Able to transfer safely, minor use of hands    Standing Unsupported with Eyes Closed Able to stand 10 seconds safely    Standing Unsupported with Feet Together Able to place feet together independently and stand for 1  minute with supervision    From Standing, Reach Forward with Outstretched Arm Can reach forward >12 cm safely (5")    From  Standing Position, Pick up Object from Floor Able to pick up shoe safely and easily    From Standing Position, Turn to Look Behind Over each Shoulder Turn sideways only but maintains balance    Turn 360 Degrees Able to turn 360 degrees safely but slowly    Standing Unsupported, Alternately Place Feet on Step/Stool Able to complete >2 steps/needs minimal assist    Standing Unsupported, One Foot in Front Able to take small step independently and hold 30 seconds    Standing on One Leg Able to lift leg independently and hold equal to or more than 3 seconds    Total Score 43                        Objective measurements completed on examination: See above findings.               PT Education - 12/06/20 1437     Education Details Education on current condition, POC, and HEP.    Person(s) Educated Patient    Methods Explanation;Demonstration;Verbal cues;Handout    Comprehension Verbalized understanding;Returned demonstration;Verbal cues required;Need further instruction              PT Short Term Goals - 12/06/20 1029       PT SHORT TERM GOAL #1   Title Patient will be independent with initial HEP.    Baseline issued at eval    Status New    Target Date 12/20/20      PT SHORT TERM GOAL #2   Title Therapist will capture FOTO and set appropriate LTG.    Baseline no time at eval    Status New    Target Date 12/20/20      PT SHORT TERM GOAL #3   Title Therapist will perform TUG and set appropriate LTG    Baseline no time at eval    Status New    Target Date 12/20/20      PT SHORT TERM GOAL #4   Title Patient will demonstrate at least 90 degrees of pain free Lt hip AROM to improve ability to complete squatting/bending activity.    Baseline see flowsheet    Status New    Target Date 12/27/20               PT Long Term  Goals - 12/06/20 1430       PT LONG TERM GOAL #1   Title Patient will demonstrate at least 4-/5 bilateral abductor strength to improve stability about the chain with prolonged walking/standing activity.    Baseline See flowsheet    Status New    Target Date 01/17/21      PT LONG TERM GOAL #2   Title Patient will score at least 45/56 on BERG to decrease her risk of falls.    Baseline 43    Status New    Target Date 01/17/21      PT LONG TERM GOAL #3   Title Patient will complete 5 x STS in less than 15 seconds to signify improvement in functional strength    Baseline 22.9    Status New    Target Date 01/17/21                    Plan - 12/06/20 1423     Clinical Impression Statement Patient is an 86 y/o female who presents to OPPT with chief complaint of intermittent Lt hip pain that has been ongoing since  sustaining a fall in June 2022. She fell in her driveway onto her Lt hip, but is unsure of what caused her fall. Upon assessment she is noted to have limited/painful Lt hip flexion AROM, painful hip adduction PROM, and TTP along Lt hip adductor musculature. She scores at an increased fall risk based upon her BERG balance test and 5xSTS test. She has significant hip abductor weakness bilaterally and antalgic gait. She will benefit from skilled PT to address the above stated deficits in order to optimize her function and reduce her risk of future falls.    Personal Factors and Comorbidities Age;Time since onset of injury/illness/exacerbation;Comorbidity 3+;Fitness;Transportation    Examination-Activity Limitations Stand;Lift;Locomotion Level;Transfers    Examination-Participation Restrictions Shop;Cleaning    Stability/Clinical Decision Making Stable/Uncomplicated    Clinical Decision Making Low    Rehab Potential Good    PT Frequency 1x / week    PT Duration --   4-6 weeks   PT Treatment/Interventions ADLs/Self Care Home Management;Cryotherapy;Moist Heat;Gait training;Stair  training;Therapeutic activities;Therapeutic exercise;Balance training;Neuromuscular re-education;Patient/family education;Manual techniques;Passive range of motion    PT Next Visit Plan capture FOTO, complete TUG, review HEP, static balance training, hip strengthening    PT Home Exercise Plan Access Code [email or web address]    Consulted and Agree with Plan of Care Patient             Patient will benefit from skilled therapeutic intervention in order to improve the following deficits and impairments:  Abnormal gait, Difficulty walking, Decreased activity tolerance, Pain, Decreased balance, Decreased strength  Visit Diagnosis: Pain in left hip  Difficulty in walking, not elsewhere classified  History of falling  Muscle weakness (generalized)     Problem List Patient Active Problem List   Diagnosis Date Noted   Atherosclerosis of aorta ([place]) 10/25/2020   Arthralgia 10/25/2020   Hip pain, acute, left 10/01/2020   Fall 10/01/2020   Wrist contusion 10/01/2020   Hypokalemia 07/12/2020   Gastroenteritis 04/27/2020   Dehydration 04/27/2020   Urinary frequency 02/28/2020   Peroneal tendinitis 11/01/2019   Spontaneous rupture of extensor tendon of left foot 10/28/2019   Contusion of left foot 10/27/2019   Sinusitis 04/13/2019   Rash 12/05/2016   Dyspnea 04/24/2016   Tricuspid valve insufficiency 04/24/2016   Cerumen impaction 11/19/2015   Ear discomfort 11/13/2015   Grief at loss of child 09/14/2015   Well adult exam 09/14/2015   Dyslipidemia    PSVT (paroxysmal supraventricular tachycardia) (HCC)    PAF (paroxysmal atrial fibrillation) ([place])    Viral gastroenteritis 07/21/2015   Acute upper respiratory infection 04/17/2015   Edema 11/08/2014   Chronic venous insufficiency 11/08/2014   Diarrhea 05/01/2014   Mass of left thigh 12/14/2013   Microhematuria 12/14/2013   Long term (current) use of anticoagulants 08/19/2013   CAD (coronary artery disease) 05/21/2011   Atrial  tachycardia ([place]) 11/19/2010   CHEST PAIN 08/07/2009   Vitamin D deficiency 04/19/2008   SUPERFICIAL THROMBOPHLEBITIS 10/20/2007   DM2 (diabetes mellitus, type 2) ([place]) 03/16/2007   HYPERCHOLESTEROLEMIA 03/15/2007   Anxiety 03/15/2007   Essential hypertension 03/15/2007   Mitral valve disorder 03/15/2007   BRONCHITIS 03/15/2007   HIATAL HERNIA 03/15/2007   Irritable bowel syndrome 03/15/2007   Osteoarthritis 03/15/2007   Osteoporosis 03/15/2007   Referring diagnosis? Arthralgia, unspecified joint  M25.552 (ICD-10-CM) - Hip pain, acute, left  M47.816 (ICD-10-CM) - Osteoarthritis of lumbar spine, unspecified spinal osteoarthritis complication status  [id number] (ICD-10-CM) - Fall, sequela  Treatment diagnosis? (if different than referring diagnosis) Pain in left  hip  Difficulty in walking, not elsewhere classified  History of falling  Muscle weakness (generalized) What was this (referring dx) caused by? '[]'$  Surgery '[x]'$  Fall '[]'$  Ongoing issue '[]'$  Arthritis '[]'$  Other: ____________  Laterality: '[]'$  Rt '[x]'$  Lt '[]'$  Both  Check all possible CPT codes:      '[]'$  97110 (Therapeutic Exercise)  '[]'$  92507 (SLP Treatment)  '[]'$  97112 (Neuro Re-ed)   '[]'$  92526 (Swallowing Treatment)   '[]'$  97116 (Gait Training)   '[]'$  [id number] (Cognitive Training, 1st 15 minutes) '[]'$  97140 (Manual Therapy)   '[]'$  97130 (Cognitive Training, each add'l 15 minutes)  '[]'$  97530 (Therapeutic Activities)  '[]'$  Other, List CPT Code ____________    '[]'$  [id number] (Self Care)       '[x]'$  All codes above (97110 - 97535)  '[]'$  97012 (Mechanical Traction)  '[]'$  97014 (E-stim Unattended)  '[]'$  97032 (E-stim manual)  '[]'$  97033 (Ionto)  '[]'$  97035 (Ultrasound)  '[]'$  97760 (Orthotic Fit) '[]'$  [id number] (Physical Performance Training) '[]'$  [id number] (Aquatic Therapy) '[]'$  97034 (Contrast Bath) '[]'$  [id number] (Paraffin) '[]'$  97597 (Wound Care 1st 20 sq cm) '[]'$  97598 (Wound Care each add'l 20 sq cm) '[]'$  97016 (Vasopneumatic Device) '[]'$  56387 (Orthotic Training) '[]'$  [id number] (Prosthetic  Training) Gwendolyn Grant, PT, DPT, ATC 12/06/20 2:42 PM   [facility] [address] [place], [place], [address] Phone: [phone]   Fax:  [phone]  Name: [name] MRN: [id number] Date of Birth: July 19, 1934

## 2020-12-12 ENCOUNTER — Other Ambulatory Visit: Payer: Self-pay

## 2020-12-12 ENCOUNTER — Ambulatory Visit (INDEPENDENT_AMBULATORY_CARE_PROVIDER_SITE_OTHER): Payer: Medicare HMO

## 2020-12-12 DIAGNOSIS — Z7901 Long term (current) use of anticoagulants: Secondary | ICD-10-CM

## 2020-12-12 DIAGNOSIS — I480 Paroxysmal atrial fibrillation: Secondary | ICD-10-CM | POA: Diagnosis not present

## 2020-12-12 LAB — POCT INR: INR: 2.5 (ref 2.0–3.0)

## 2020-12-12 NOTE — Patient Instructions (Signed)
Continue taking 1 tablet daily except 1/2 tablet each Sunday.  Repeat INR in 6 weeks.  *Consistent amount of greens every week*

## 2020-12-13 ENCOUNTER — Ambulatory Visit: Payer: Medicare HMO | Attending: Internal Medicine

## 2020-12-13 DIAGNOSIS — Z9181 History of falling: Secondary | ICD-10-CM | POA: Diagnosis not present

## 2020-12-13 DIAGNOSIS — M6281 Muscle weakness (generalized): Secondary | ICD-10-CM | POA: Diagnosis not present

## 2020-12-13 DIAGNOSIS — R262 Difficulty in walking, not elsewhere classified: Secondary | ICD-10-CM | POA: Insufficient documentation

## 2020-12-13 DIAGNOSIS — M25552 Pain in left hip: Secondary | ICD-10-CM | POA: Insufficient documentation

## 2020-12-13 NOTE — Therapy (Signed)
[place], [place], [address] Phone: [phone]   Fax:  [phone]  Physical Therapy Treatment  Patient Details  Name: [name] MRN: [id number] Date of Birth: 02-Jun-1934 Referring Provider (PT): Plotnikov, Evie Lacks, MD   Encounter Date: 12/13/2020   PT End of Session - 12/13/20 1032     Visit Number 2    Number of Visits 7    Date for PT Re-Evaluation 01/19/21    Authorization Type [employer] - requesting auth    PT Start Time 1022    PT Stop Time 1105    PT Time Calculation (min) 43 min    Activity Tolerance Patient tolerated treatment well    Behavior During Therapy [facility] for tasks assessed/performed             Past Medical History:  Diagnosis Date   Anxiety state, unspecified    Bronchitis, not specified as acute or chronic    Diaphragmatic hernia without mention of obstruction or gangrene    Dyslipidemia    Essential hypertension    Irritable bowel syndrome    Mitral valve disorders(424.0)    a. 08/2010 Echo: EF >55%, mild MR, mod TR, trace AI.   Non-obstructive CAD    a. 07/2010 Lexiscan MV: no ischemia, EF 78%; b. 07/2010 Cath: LM nl, LAD50/60p, 40 apical, LCX nl, RCA dominant, 30/40p, 53m   Osteoarthrosis, unspecified whether generalized or localized, unspecified site    Osteoporosis, unspecified    Other abnormal glucose    PAF (paroxysmal atrial fibrillation) (HCC)    a. CHA2DS2VASc = 4-->coumadin.   Phlebitis and thrombophlebitis of superficial vessels of lower extremities    PSVT (paroxysmal supraventricular tachycardia) ([place]    a. 05/2012 Holter: short bursts of PSVT noted-->Managed with toprol.   Unspecified venous (peripheral) insufficiency    Unspecified vitamin D deficiency     Past Surgical History:  Procedure Laterality Date   CARDIAC CATHETERIZATION  07/30/2010   nonobstructive CAD, 20-40% RCA stenosis, 50-60% eccentric LAD stenosis more prox, 40% LAD stenosis more  distal, EF 65%   TRANSTHORACIC ECHOCARDIOGRAM  09/03/2010   EF=>55%; mild MR; mod TR; AV mildly sclerotic with trace regurg; mild pulm valve regurg   VESICOVAGINAL FISTULA CLOSURE W/ TAH      There were no vitals filed for this visit.   Subjective Assessment - 12/13/20 1035     Subjective Pt presents to PT with no reports of L hip pain. She has been compliant with HEP per her reports of with no adverse effect. Pt is ready to begin PT at this time.    Currently in Pain? No/denies           [facility] Adult PT Treatment/Exercise:   Therapeutic Exercise:  Supine ball squeeze 2x10 - 5 sec hold Supine clamshell 2x10 red tband Supine bridge 2x10 - 3 sec hold SKTC x 30 sec ea Supine hip add stretch x 30 sec ea Seated marches 2x30 sec Therapeutic Activities: Assessment of outcomes for LTG planning     [facility] PT Assessment - 12/13/20 0001       Observation/Other Assessments   Focus on Therapeutic Outcomes (FOTO)  47% function; 60% predicted      Balance   Balance Assessed Yes      Standardized Balance Assessment   Standardized Balance Assessment Timed Up and Go Test      Timed Up and Go Test   Normal TUG (seconds) 27  PT Education - 12/13/20 1110     Education Details HEP review    Person(s) Educated Patient    Methods Explanation;Demonstration;Handout    Comprehension Verbalized understanding;Returned demonstration              PT Short Term Goals - 12/06/20 1029       PT SHORT TERM GOAL #1   Title Patient will be independent with initial HEP.    Baseline issued at eval    Status New    Target Date 12/20/20      PT SHORT TERM GOAL #2   Title Therapist will capture FOTO and set appropriate LTG.    Baseline no time at eval    Status New    Target Date 12/20/20      PT SHORT TERM GOAL #3   Title Therapist will perform TUG and set appropriate LTG    Baseline no time at eval    Status New    Target  Date 12/20/20      PT SHORT TERM GOAL #4   Title Patient will demonstrate at least 90 degrees of pain free Lt hip AROM to improve ability to complete squatting/bending activity.    Baseline see flowsheet    Status New    Target Date 12/27/20               PT Long Term Goals - 12/13/20 1115       PT LONG TERM GOAL #1   Title Patient will demonstrate at least 4-/5 bilateral abductor strength to improve stability about the chain with prolonged walking/standing activity.    Baseline See flowsheet    Status New    Target Date 01/17/21      PT LONG TERM GOAL #2   Title Patient will score at least 45/56 on BERG to decrease her risk of falls.    Baseline 43    Status New    Target Date 01/17/21      PT LONG TERM GOAL #3   Title Patient will complete 5 x STS in less than 15 seconds to signify improvement in functional strength    Baseline 22.9    Status New    Target Date 01/17/21      PT LONG TERM GOAL #4   Title Pt will improve FOTO function score to no less than 60% as proxy for functional improvement    Baseline 47% function    Status New    Target Date 01/17/21      PT LONG TERM GOAL #5   Title Pt will decrease TUG time to no greater than 17 seconds for improved functional mobility and decreased fall risk    Baseline 27 seconds    Status New    Target Date 01/17/21                   Plan - 12/13/20 1043     Clinical Impression Statement Pt was able to complete prescribed exercises with no adverse effect or increase in baseline pain. Her TUG time taken  today indicates pt continues to be at an increased risk for falls. FOTO also captured today, with LTGs created for both outcomes. Today's session also focused on HEP review and addtional strenghtening exercises added to HEP. PT will continue to progress exercises as able per POC.    PT Treatment/Interventions ADLs/Self Care Home Management;Cryotherapy;Moist Heat;Gait training;Stair training;Therapeutic  activities;Therapeutic exercise;Balance training;Neuromuscular re-education;Patient/family education;Manual techniques;Passive range of motion    PT Next Visit  Plan HEP, static balance training, hip strengthening; progress as able    PT Home Exercise Plan Access Code [email or web address]             Patient will benefit from skilled therapeutic intervention in order to improve the following deficits and impairments:  Abnormal gait, Difficulty walking, Decreased activity tolerance, Pain, Decreased balance, Decreased strength  Visit Diagnosis: Pain in left hip  Difficulty in walking, not elsewhere classified  History of falling  Muscle weakness (generalized)     Problem List Patient Active Problem List   Diagnosis Date Noted   Atherosclerosis of aorta ([place]) 10/25/2020   Arthralgia 10/25/2020   Hip pain, acute, left 10/01/2020   Fall 10/01/2020   Wrist contusion 10/01/2020   Hypokalemia 07/12/2020   Gastroenteritis 04/27/2020   Dehydration 04/27/2020   Urinary frequency 02/28/2020   Peroneal tendinitis 11/01/2019   Spontaneous rupture of extensor tendon of left foot 10/28/2019   Contusion of left foot 10/27/2019   Sinusitis 04/13/2019   Rash 12/05/2016   Dyspnea 04/24/2016   Tricuspid valve insufficiency 04/24/2016   Cerumen impaction 11/19/2015   Ear discomfort 11/13/2015   Grief at loss of child 09/14/2015   Well adult exam 09/14/2015   Dyslipidemia    PSVT (paroxysmal supraventricular tachycardia) (HCC)    PAF (paroxysmal atrial fibrillation) ([place])    Viral gastroenteritis 07/21/2015   Acute upper respiratory infection 04/17/2015   Edema 11/08/2014   Chronic venous insufficiency 11/08/2014   Diarrhea 05/01/2014   Mass of left thigh 12/14/2013   Microhematuria 12/14/2013   Long term (current) use of anticoagulants 08/19/2013   CAD (coronary artery disease) 05/21/2011   Atrial tachycardia ([place]) 11/19/2010   CHEST PAIN 08/07/2009   Vitamin D deficiency 04/19/2008    SUPERFICIAL THROMBOPHLEBITIS 10/20/2007   DM2 (diabetes mellitus, type 2) ([place]) 03/16/2007   HYPERCHOLESTEROLEMIA 03/15/2007   Anxiety 03/15/2007   Essential hypertension 03/15/2007   Mitral valve disorder 03/15/2007   BRONCHITIS 03/15/2007   HIATAL HERNIA 03/15/2007   Irritable bowel syndrome 03/15/2007   Osteoarthritis 03/15/2007   Osteoporosis 03/15/2007    Ward Chatters, PT, DPT 12/13/20 11:17 AM  [facility] [address] [place], [place], [address] Phone: [phone]   Fax:  [phone]  Name: [name] MRN: [id number] Date of Birth: [birth year]

## 2020-12-14 ENCOUNTER — Other Ambulatory Visit: Payer: Self-pay | Admitting: Internal Medicine

## 2020-12-14 DIAGNOSIS — I480 Paroxysmal atrial fibrillation: Secondary | ICD-10-CM

## 2020-12-20 ENCOUNTER — Ambulatory Visit: Payer: Medicare HMO

## 2020-12-20 ENCOUNTER — Other Ambulatory Visit: Payer: Self-pay

## 2020-12-20 DIAGNOSIS — M25552 Pain in left hip: Secondary | ICD-10-CM | POA: Diagnosis not present

## 2020-12-20 DIAGNOSIS — Z9181 History of falling: Secondary | ICD-10-CM

## 2020-12-20 DIAGNOSIS — M6281 Muscle weakness (generalized): Secondary | ICD-10-CM

## 2020-12-20 DIAGNOSIS — R262 Difficulty in walking, not elsewhere classified: Secondary | ICD-10-CM | POA: Diagnosis not present

## 2020-12-20 NOTE — Therapy (Signed)
[place], [place], [address] Phone: [phone]   Fax:  [phone]  Physical Therapy Treatment  Patient Details  Name: [name] MRN: [id number] Date of Birth: 06/16/1934 Referring Provider (PT): Plotnikov, Evie Lacks, MD   Encounter Date: 12/20/2020   PT End of Session - 12/20/20 1015     Visit Number 3    Number of Visits 7    Date for PT Re-Evaluation 01/19/21    Authorization Type [employer] -    Authorization Time Period 8/25-10/7    Authorization - Visit Number 2    Authorization - Number of Visits 6    PT Start Time 1015    PT Stop Time 1058    PT Time Calculation (min) 43 min    Activity Tolerance Patient tolerated treatment well    Behavior During Therapy [facility] for tasks assessed/performed             Past Medical History:  Diagnosis Date   Anxiety state, unspecified    Bronchitis, not specified as acute or chronic    Diaphragmatic hernia without mention of obstruction or gangrene    Dyslipidemia    Essential hypertension    Irritable bowel syndrome    Mitral valve disorders(424.0)    a. 08/2010 Echo: EF >55%, mild MR, mod TR, trace AI.   Non-obstructive CAD    a. 07/2010 Lexiscan MV: no ischemia, EF 78%; b. 07/2010 Cath: LM nl, LAD50/60p, 40 apical, LCX nl, RCA dominant, 30/40p, 45m   Osteoarthrosis, unspecified whether generalized or localized, unspecified site    Osteoporosis, unspecified    Other abnormal glucose    PAF (paroxysmal atrial fibrillation) (HCC)    a. CHA2DS2VASc = 4-->coumadin.   Phlebitis and thrombophlebitis of superficial vessels of lower extremities    [place] ([place]    a. 05/2012 Holter: short bursts of PSVT noted-->Managed with toprol.   Unspecified venous (peripheral) insufficiency    Unspecified vitamin D deficiency     Past Surgical History:  Procedure Laterality Date   CARDIAC CATHETERIZATION  07/30/2010    nonobstructive CAD, 20-40% RCA stenosis, 50-60% eccentric LAD stenosis more prox, 40% LAD stenosis more distal, EF 65%   TRANSTHORACIC ECHOCARDIOGRAM  09/03/2010   EF=>55%; mild MR; mod TR; AV mildly sclerotic with trace regurg; mild pulm valve regurg   VESICOVAGINAL FISTULA CLOSURE W/ TAH      There were no vitals filed for this visit.   Subjective Assessment - 12/20/20 1016     Subjective Patient reports she is doing well today without pain. Her hip is feeling better and she reports no falls.    Currently in Pain? No/denies                [facility] - 12/20/20 0001       AROM   Overall AROM Comments 91 Lt hip flexion AROM; no pain              [facility] Adult PT Treatment/Exercise:   Therapeutic Exercise:  Nustep level 4 UE/LE x 5 minutes  Clamshells 2 x 10 bilateral  SLR 2 x 10 bilateral  STS 2 x 5  Standing calf raise 1 x 10   Not today* Supine ball squeeze 2x10 - 5 sec hold Supine clamshell 2x10 red tband Supine bridge 2x10 - 3 sec hold SKTC x 30 sec ea Supine hip add stretch x 30 sec ea Seated marches 2x30 sec  Therapeutic Activities: N/A  Manual Therapy: - n/a  Neuromuscular re-ed: - romberg eyes open 30 sec - romberg eyes closed 30 sec - semi-tandem 2 x 30 sec each   Self-care/Home Management: - n/a                         PT Short Term Goals - 12/20/20 1047       PT SHORT TERM GOAL #1   Title Patient will be independent with initial HEP.    Baseline issued at eval    Status On-going    Target Date 12/20/20      PT SHORT TERM GOAL #2   Title Therapist will capture FOTO and set appropriate LTG.    Baseline no time at eval    Status Achieved    Target Date 12/20/20      PT SHORT TERM GOAL #3   Title Therapist will perform TUG and set appropriate LTG    Baseline no time at eval    Status Achieved    Target Date 12/20/20      PT SHORT TERM GOAL #4   Title Patient will demonstrate at least 90 degrees of  pain free Lt hip AROM to improve ability to complete squatting/bending activity.    Baseline see flowsheet    Status Achieved    Target Date 12/27/20               PT Long Term Goals - 12/13/20 1115       PT LONG TERM GOAL #1   Title Patient will demonstrate at least 4-/5 bilateral abductor strength to improve stability about the chain with prolonged walking/standing activity.    Baseline See flowsheet    Status New    Target Date 01/17/21      PT LONG TERM GOAL #2   Title Patient will score at least 45/56 on BERG to decrease her risk of falls.    Baseline 43    Status New    Target Date 01/17/21      PT LONG TERM GOAL #3   Title Patient will complete 5 x STS in less than 15 seconds to signify improvement in functional strength    Baseline 22.9    Status New    Target Date 01/17/21      PT LONG TERM GOAL #4   Title Pt will improve FOTO function score to no less than 60% as proxy for functional improvement    Baseline 47% function    Status New    Target Date 01/17/21      PT LONG TERM GOAL #5   Title Pt will decrease TUG time to no greater than 17 seconds for improved functional mobility and decreased fall risk    Baseline 27 seconds    Status New    Target Date 01/17/21                   Plan - 12/20/20 1024     Clinical Impression Statement Patient tolerated session well today with progression of hip strengthening and introduction to static balance training. She quickly fatigues with hip strengthening requiring frequent rest breaks between exercises today. She was able to complete sit to stand without use of UE support, though is very slow with movement. Moderate postural sway with static balance activity, though no LOB. She reported fatigue at end of session, though no pain.    PT Treatment/Interventions ADLs/Self Care Home Management;Cryotherapy;Moist Heat;Gait training;Stair training;Therapeutic activities;Therapeutic exercise;Balance  training;Neuromuscular re-education;Patient/family education;Manual techniques;Passive range of motion    PT Next Visit Plan HEP, static balance training, hip strengthening; progress as able    PT Home Exercise Plan Access Code [email or web address]             Patient will benefit from skilled therapeutic intervention in order to improve the following deficits and impairments:  Abnormal gait, Difficulty walking, Decreased activity tolerance, Pain, Decreased balance, Decreased strength  Visit Diagnosis: Pain in left hip  Difficulty in walking, not elsewhere classified  History of falling  Muscle weakness (generalized)     Problem List Patient Active Problem List   Diagnosis Date Noted   Atherosclerosis of aorta ([place]) 10/25/2020   Arthralgia 10/25/2020   Hip pain, acute, left 10/01/2020   Fall 10/01/2020   Wrist contusion 10/01/2020   Hypokalemia 07/12/2020   Gastroenteritis 04/27/2020   Dehydration 04/27/2020   Urinary frequency 02/28/2020   Peroneal tendinitis 11/01/2019   Spontaneous rupture of extensor tendon of left foot 10/28/2019   Contusion of left foot 10/27/2019   Sinusitis 04/13/2019   Rash 12/05/2016   Dyspnea 04/24/2016   Tricuspid valve insufficiency 04/24/2016   Cerumen impaction 11/19/2015   Ear discomfort 11/13/2015   Grief at loss of child 09/14/2015   Well adult exam 09/14/2015   Dyslipidemia    PSVT (paroxysmal supraventricular tachycardia) (HCC)    PAF (paroxysmal atrial fibrillation) ([place])    Viral gastroenteritis 07/21/2015   Acute upper respiratory infection 04/17/2015   Edema 11/08/2014   Chronic venous insufficiency 11/08/2014   Diarrhea 05/01/2014   Mass of left thigh 12/14/2013   Microhematuria 12/14/2013   Long term (current) use of anticoagulants 08/19/2013   CAD (coronary artery disease) 05/21/2011   Atrial tachycardia ([place]) 11/19/2010   CHEST PAIN 08/07/2009   Vitamin D deficiency 04/19/2008   SUPERFICIAL THROMBOPHLEBITIS 10/20/2007    DM2 (diabetes mellitus, type 2) ([place]) 03/16/2007   HYPERCHOLESTEROLEMIA 03/15/2007   Anxiety 03/15/2007   Essential hypertension 03/15/2007   Mitral valve disorder 03/15/2007   BRONCHITIS 03/15/2007   HIATAL HERNIA 03/15/2007   Irritable bowel syndrome 03/15/2007   Osteoarthritis 03/15/2007   Osteoporosis 03/15/2007  Gwendolyn Grant, PT, DPT, ATC 12/20/20 11:00 AM   [facility] [address] [place], [place], [address] Phone: [phone]   Fax:  [phone]  Name: [name] MRN: [id number] Date of Birth: [birth year]

## 2020-12-24 ENCOUNTER — Other Ambulatory Visit: Payer: Self-pay

## 2020-12-24 MED ORDER — METOPROLOL SUCCINATE ER 50 MG PO TB24: ORAL_TABLET | ORAL | 6 refills | Status: DC

## 2020-12-27 ENCOUNTER — Other Ambulatory Visit: Payer: Self-pay

## 2020-12-27 ENCOUNTER — Ambulatory Visit: Payer: Medicare HMO

## 2020-12-27 DIAGNOSIS — R262 Difficulty in walking, not elsewhere classified: Secondary | ICD-10-CM

## 2020-12-27 DIAGNOSIS — M25552 Pain in left hip: Secondary | ICD-10-CM

## 2020-12-27 DIAGNOSIS — Z9181 History of falling: Secondary | ICD-10-CM

## 2020-12-27 DIAGNOSIS — M6281 Muscle weakness (generalized): Secondary | ICD-10-CM | POA: Diagnosis not present

## 2020-12-27 NOTE — Therapy (Signed)
[place], [place], [address] Phone: [phone]   Fax:  [phone]  Physical Therapy Treatment  Patient Details  Name: [name] MRN: [id number] Date of Birth: 1934/12/11 Referring Provider (PT): Plotnikov, Evie Lacks, MD   Encounter Date: 12/27/2020   PT End of Session - 12/27/20 1004     Visit Number 4    Number of Visits 7    Date for PT Re-Evaluation 01/19/21    Authorization Type [employer] -    Authorization Time Period 8/25-10/7    Authorization - Visit Number 4    Authorization - Number of Visits 6    PT Start Time 1006    PT Stop Time 1045    PT Time Calculation (min) 39 min    Activity Tolerance Patient tolerated treatment well    Behavior During Therapy [facility] for tasks assessed/performed             Past Medical History:  Diagnosis Date   Anxiety state, unspecified    Bronchitis, not specified as acute or chronic    Diaphragmatic hernia without mention of obstruction or gangrene    Dyslipidemia    Essential hypertension    Irritable bowel syndrome    Mitral valve disorders(424.0)    a. 08/2010 Echo: EF >55%, mild MR, mod TR, trace AI.   Non-obstructive CAD    a. 07/2010 Lexiscan MV: no ischemia, EF 78%; b. 07/2010 Cath: LM nl, LAD50/60p, 40 apical, LCX nl, RCA dominant, 30/40p, 18m   Osteoarthrosis, unspecified whether generalized or localized, unspecified site    Osteoporosis, unspecified    Other abnormal glucose    PAF (paroxysmal atrial fibrillation) (HCC)    a. CHA2DS2VASc = 4-->coumadin.   Phlebitis and thrombophlebitis of superficial vessels of lower extremities    PSVT (parox[place]ricular tachycardia) ([place]    a. 05/2012 Holter: short bursts of PSVT noted-->Managed with toprol.   Unspecified venous (peripheral) insufficiency    Unspecified vitamin D deficiency     Past Surgical History:  Procedure Laterality Date   CARDIAC CATHETERIZATION  07/30/2010    nonobstructive CAD, 20-40% RCA stenosis, 50-60% eccentric LAD stenosis more prox, 40% LAD stenosis more distal, EF 65%   TRANSTHORACIC ECHOCARDIOGRAM  09/03/2010   EF=>55%; mild MR; mod TR; AV mildly sclerotic with trace regurg; mild pulm valve regurg   VESICOVAGINAL FISTULA CLOSURE W/ TAH      There were no vitals filed for this visit.   Subjective Assessment - 12/27/20 1005     Subjective Pt presents with no current reports of pain in hip. She has been compliant with her HEP per reports with no adverse effect. Pt is ready to begin PT treatment at this time.    Currently in Pain? No/denies           [facility] Adult PT Treatment/Exercise:   Therapeutic Exercise:  Nustep level 5 UE/LE x 4 minutes  Clamshells 2 x 15 bilateral green tband SLR x 10 bilateral  STS 3 x 5 - no UE support Standing hip abd 2x10 ea Standing calf raise x 15   Past Interventions Not Performed Today: Supine ball squeeze 2x10 - 5 sec hold Supine bridge 2x10 - 3 sec hold SKTC x 30 sec ea Supine hip add stretch x 30 sec ea Seated marches 2x30 sec   Therapeutic Activities: N/A   Manual Therapy: N/A  Neuromuscular re-ed: romberg eyes closed 2x30 sec semi-tandem 2 x 30 sec each    Self-care/Home  Management: N/A                                PT Short Term Goals - 12/20/20 1047       PT SHORT TERM GOAL #1   Title Patient will be independent with initial HEP.    Baseline issued at eval    Status On-going    Target Date 12/20/20      PT SHORT TERM GOAL #2   Title Therapist will capture FOTO and set appropriate LTG.    Baseline no time at eval    Status Achieved    Target Date 12/20/20      PT SHORT TERM GOAL #3   Title Therapist will perform TUG and set appropriate LTG    Baseline no time at eval    Status Achieved    Target Date 12/20/20      PT SHORT TERM GOAL #4   Title Patient will demonstrate at least 90 degrees of pain free Lt hip AROM to improve ability to  complete squatting/bending activity.    Baseline see flowsheet    Status Achieved    Target Date 12/27/20               PT Long Term Goals - 12/13/20 1115       PT LONG TERM GOAL #1   Title Patient will demonstrate at least 4-/5 bilateral abductor strength to improve stability about the chain with prolonged walking/standing activity.    Baseline See flowsheet    Status New    Target Date 01/17/21      PT LONG TERM GOAL #2   Title Patient will score at least 45/56 on BERG to decrease her risk of falls.    Baseline 43    Status New    Target Date 01/17/21      PT LONG TERM GOAL #3   Title Patient will complete 5 x STS in less than 15 seconds to signify improvement in functional strength    Baseline 22.9    Status New    Target Date 01/17/21      PT LONG TERM GOAL #4   Title Pt will improve FOTO function score to no less than 60% as proxy for functional improvement    Baseline 47% function    Status New    Target Date 01/17/21      PT LONG TERM GOAL #5   Title Pt will decrease TUG time to no greater than 17 seconds for improved functional mobility and decreased fall risk    Baseline 27 seconds    Status New    Target Date 01/17/21                   Plan - 12/27/20 1017     Clinical Impression Statement Pt again tolerated treatment well with no adverse effect or change in baseline reports of pain. Today's session focused on continued strengthening of proximal hip musculature and improving static balance. Pt continues to benefit form skilled PT services, as she continues to fatigue quickly with exercises and demonstrates continued LE weakness. PT will continue to progress exercises as tolerated per POC.    PT Treatment/Interventions ADLs/Self Care Home Management;Cryotherapy;Moist Heat;Gait training;Stair training;Therapeutic activities;Therapeutic exercise;Balance training;Neuromuscular re-education;Patient/family education;Manual techniques;Passive range of  motion    PT Next Visit Plan HEP, static balance training, hip strengthening; progress as able    PT Home Exercise Plan  Access Code [email or web address]             Patient will benefit from skilled therapeutic intervention in order to improve the following deficits and impairments:  Abnormal gait, Difficulty walking, Decreased activity tolerance, Pain, Decreased balance, Decreased strength  Visit Diagnosis: Pain in left hip  Difficulty in walking, not elsewhere classified  History of falling  Muscle weakness (generalized)     Problem List Patient Active Problem List   Diagnosis Date Noted   Atherosclerosis of aorta ([place]) 10/25/2020   Arthralgia 10/25/2020   Hip pain, acute, left 10/01/2020   Fall 10/01/2020   Wrist contusion 10/01/2020   Hypokalemia 07/12/2020   Gastroenteritis 04/27/2020   Dehydration 04/27/2020   Urinary frequency 02/28/2020   Peroneal tendinitis 11/01/2019   Spontaneous rupture of extensor tendon of left foot 10/28/2019   Contusion of left foot 10/27/2019   Sinusitis 04/13/2019   Rash 12/05/2016   Dyspnea 04/24/2016   Tricuspid valve insufficiency 04/24/2016   Cerumen impaction 11/19/2015   Ear discomfort 11/13/2015   Grief at loss of child 09/14/2015   Well adult exam 09/14/2015   Dyslipidemia    PSVT (paroxysmal supraventricular tachycardia) (HCC)    PAF (paroxysmal atrial fibrillation) ([place])    Viral gastroenteritis 07/21/2015   Acute upper respiratory infection 04/17/2015   Edema 11/08/2014   Chronic venous insufficiency 11/08/2014   Diarrhea 05/01/2014   Mass of left thigh 12/14/2013   Microhematuria 12/14/2013   Long term (current) use of anticoagulants 08/19/2013   CAD (coronary artery disease) 05/21/2011   Atrial tachycardia ([place]) 11/19/2010   CHEST PAIN 08/07/2009   Vitamin D deficiency 04/19/2008   SUPERFICIAL THROMBOPHLEBITIS 10/20/2007   DM2 (diabetes mellitus, type 2) ([place]) 03/16/2007   HYPERCHOLESTEROLEMIA 03/15/2007   Anxiety  03/15/2007   Essential hypertension 03/15/2007   Mitral valve disorder 03/15/2007   BRONCHITIS 03/15/2007   HIATAL HERNIA 03/15/2007   Irritable bowel syndrome 03/15/2007   Osteoarthritis 03/15/2007   Osteoporosis 03/15/2007    Ward Chatters, PT 12/27/2020, 10:45 AM  [facility] [address] [place], [place], [address] Phone: [phone]   Fax:  [phone]  Name: [name] MRN: [id number] Date of Birth: [birth year]

## 2021-01-01 ENCOUNTER — Other Ambulatory Visit: Payer: Self-pay | Admitting: *Deleted

## 2021-01-01 ENCOUNTER — Telehealth: Payer: Self-pay

## 2021-01-01 NOTE — Chronic Care Management (AMB) (Signed)
Chronic Care Management Pharmacy Assistant   Name: [name]  MRN: [id number] DOB: 1934-09-23  [name] is an 86 y.o. year old female who presents for his initial CCM visit with the clinical pharmacist.  Recent office visits:  10/25/20-Kelly V. Plotnikov, MD (PCP) Seen for annual exam. Labs ordered. Hold simvastatin for 2-3 weeks. Follow up in 3 months. 10/01/20-Kelly V. Plotnikov, MD (PCP) Seen for hip pain. Left wrist xray ordered. Tramadol 50 mg every 6 hours. Follow up in 4 weeks. 07/12/20-Kelly V. Plotnikov, MD (PCP) Seen for 3 month follow up. Follow up in 3 months.  Recent consult visits:  12/27/20-[name], PT (Outpatient Rehab) 12/20/20-[name], PT (Outpatient Rehab) 12/13/20-[name], PT (Outpatient Rehab) 12/12/20-Kelly Dapp, RN (Cardiology) Anti-coag visit 12/06/20-[name], PT (Outpatient Rehab)  10/24/20-Kelly Dapp, RN (Cardiology) Anti-coag visit 10/10/20-[name] (Cardiology) Anti-coag visit 09/27/20-[name] (Cardiology) Follow up for palpitations. Increase of her Toprol up to 100 mg twice daily. Follow up in 12 months. 09/05/20-Kelly Dapp, RN (Cardiology) Anti-coag visit. 07/25/20-Kelly Dapp, RN (Cardiology) Anti-coag visit.  Hospital visits:  None in previous 6 months  Medications: Outpatient Encounter Medications as of 01/01/2021  Medication Sig   ACCU-CHEK AVIVA PLUS test strip USE TO CHECK BLOOD SUGARS DAILY   Accu-Chek Softclix Lancets lancets USE  AS  INSTRUCTED   Alcohol Swabs (B-D SINGLE USE SWABS REGULAR) PADS USE TO CLEAN INJECTION SITE TO CHECK BLOOD SUGARS DAILY   ALPRAZolam (XANAX) 0.5 MG tablet TAKE 1/2 TO 1 TABLET BY MOUTH THREE TIMES DAILY AS NEEDED FOR ANXIETY. DO NOT TAKE WITH TRAMADOL.THIS IS A 30 DAY SUPPLY   amLODipine (NORVASC) 5 MG tablet TAKE 1 TABLET(5 MG) BY MOUTH DAILY   aspirin 81 MG tablet Take 81 mg by mouth daily.   Cholecalciferol (VITAMIN D3)  50 MCG (2000 UT) capsule TAKE 1 CAPSULE BY MOUTH ONCE DAILY   diazepam (VALIUM) 5 MG tablet Take by mouth.   diclofenac Sodium (VOLTAREN) 1 % GEL Apply 2 g topically 4 (four) times daily.   furosemide (LASIX) 20 MG tablet TAKE 1 TABLET(20 MG) BY MOUTH DAILY AS NEEDED FOR SWELLING (Patient taking differently: Take 20 mg by mouth daily as needed for edema (swelling).)   hydroxypropyl methylcellulose (ISOPTO TEARS) 2.5 % ophthalmic solution Place 1 drop into both eyes 2 (two) times daily.   hydrOXYzine (ATARAX/VISTARIL) 25 MG tablet Take 1 tablet (25 mg total) by mouth every 8 (eight) hours as needed for itching.   isosorbide mononitrate (IMDUR) 30 MG 24 hr tablet TAKE 1 TABLET BY MOUTH EVERY DAY   metoprolol succinate (TOPROL-XL) 50 MG 24 hr tablet Takes 1&1/2 tablets twice a day   pantoprazole (PROTONIX) 40 MG tablet TAKE 1 TABLET BY MOUTH EVERY DAY   simvastatin (ZOCOR) 20 MG tablet TAKE 1 TABLET BY MOUTH AT BEDTIME   traMADol (ULTRAM) 50 MG tablet Take 1 tablet (50 mg total) by mouth 3 (three) times daily as needed.   warfarin (COUMADIN) 5 MG tablet TAKE 1 TABLET BY MOUTH EVERY DAY AS DIRECTED BY COUMADIN CLINIC   No facility-administered encounter medications on file as of 01/01/2021.   ALPRAZolam (XANAX) 0.5 MG tablet Last filled:10/17/20 30 DS AmLODipine (NORVASC) 5 MG tablet Last filled:09/15/20 90 DS Aspirin 81 MG tablet Last filled:None noted Cholecalciferol (VITAMIN D3) 50 MCG (2000 UT) capsule Last filled:04/29/20 90 DS Diazepam (VALIUM) 5 MG tablet Last filled:08/31/19 1 DS Diclofenac Sodium (VOLTAREN) 1 % GEL Last filled:11/27/19 15 DS Furosemide (LASIX)  20 MG tablet Last filled:11/22/20 30 DS Hydroxypropyl methylcellulose (ISOPTO TEARS) 2.5 % ophthalmic solution Last filled:None noted HydrOXYzine (ATARAX/VISTARIL) 25 MG tablet Last filled:01/19/19 20 DS Isosorbide mononitrate (IMDUR) 30 MG 24 hr tablet Last filled:09/29/20 90 DS Metoprolol succinate (TOPROL-XL) 50 MG 24 hr tablet  Last filled:11/07/20 30 DS Pantoprazole (PROTONIX) 40 MG tablet Last filled:11/23/20 90 DS Simvastatin (ZOCOR) 20 MG tablet Last filled:11/08/20 90 DS TraMADol (ULTRAM) 50 MG tablet Last filled:11/23/20 30 DS Warfarin (COUMADIN) 5 MG tablet Last filled:09/17/20 90 DS   Star Rating Drugs: Simvastatin (ZOCOR) 20 MG tablet Last filled:11/08/20 90 DS  Care Gaps: URINE MICROALBUMIN:Never done Zoster Vaccines- Shingrix:Never done OPHTHALMOLOGY EXAM:Last completed: Apr 17, 2017 FOOT EXAM:Last completed: Oct 29, 2017 COVID-19 Vaccine:Last completed: Aug 03, 2020 INFLUENZA VACCINE:Last completed: Feb 07, 2020  [name], [place]

## 2021-01-03 ENCOUNTER — Other Ambulatory Visit: Payer: Self-pay

## 2021-01-03 ENCOUNTER — Ambulatory Visit: Payer: Medicare HMO

## 2021-01-03 DIAGNOSIS — R262 Difficulty in walking, not elsewhere classified: Secondary | ICD-10-CM | POA: Diagnosis not present

## 2021-01-03 DIAGNOSIS — M25552 Pain in left hip: Secondary | ICD-10-CM | POA: Diagnosis not present

## 2021-01-03 DIAGNOSIS — M6281 Muscle weakness (generalized): Secondary | ICD-10-CM | POA: Diagnosis not present

## 2021-01-03 DIAGNOSIS — Z9181 History of falling: Secondary | ICD-10-CM

## 2021-01-03 NOTE — Therapy (Signed)
[facility], [place], [address] Phone: [phone]   Fax:  [phone]  Physical Therapy Treatment  Patient Details  Name: [name] MRN: [id number] Date of Birth: Oct 08, 1934 Referring Provider (PT): Plotnikov, Evie Lacks, MD   Encounter Date: 01/03/2021   PT End of Session - 01/03/21 1010     Visit Number 5    Number of Visits 7    Date for PT Re-Evaluation 01/19/21    Authorization Type [employer] -    Authorization Time Period 8/25-10/7    Authorization - Visit Number 5    Authorization - Number of Visits 6    PT Start Time 1010    PT Stop Time 1051    PT Time Calculation (min) 41 min    Activity Tolerance Patient tolerated treatment well    Behavior During Therapy [facility] for tasks assessed/performed             Past Medical History:  Diagnosis Date   Anxiety state, unspecified    Bronchitis, not specified as acute or chronic    Diaphragmatic hernia without mention of obstruction or gangrene    Dyslipidemia    Essential hypertension    Irritable bowel syndrome    Mitral valve disorders(424.0)    a. 08/2010 Echo: EF >55%, mild MR, mod TR, trace AI.   Non-obstructive CAD    a. 07/2010 Lexiscan MV: no ischemia, EF 78%; b. 07/2010 Cath: LM nl, LAD50/60p, 40 apical, LCX nl, RCA dominant, 30/40p, 73m.   Osteoarthrosis, unspecified whether generalized or localized, unspecified site    Osteoporosis, unspecified    Other abnormal glucose    PAF (paroxysmal atrial fibrillation) (HCC)    a. CHA2DS2VASc = 4-->coumadin.   Phlebitis and thrombophlebitis of superficial vessels of lower extremities    PSVT (paroxysmal supraventricular tachycardia) ([place])    a. 05/2012 Holter: short bursts of PSVT noted-->Managed with toprol.   Unspecified venous (peripheral) insufficiency    Unspecified vitamin D deficiency     Past Surgical History:  Procedure Laterality Date   CARDIAC CATHETERIZATION  07/30/2010    nonobstructive CAD, 20-40% RCA stenosis, 50-60% eccentric LAD stenosis more prox, 40% LAD stenosis more distal, EF 65%   TRANSTHORACIC ECHOCARDIOGRAM  09/03/2010   EF=>55%; mild MR; mod TR; AV mildly sclerotic with trace regurg; mild pulm valve regurg   VESICOVAGINAL FISTULA CLOSURE W/ TAH      There were no vitals filed for this visit.   Subjective Assessment - 01/03/21 1010     Subjective Pt presents to PT with no current reports of pain in L hip. She notes that she continues to be fairly compliant with her HEP with no adverse effects noted. Pt is ready to begin PT treatment at this time.    Currently in Pain? No/denies           [facility] Adult PT Treatment/Exercise:   Therapeutic Exercise:  Nustep level 5 UE/LE x 5 minutes while taking subjective STS 2x10- no UE support Standing hip abd/ext 2x10 ea 2lbs Heel-Toe raises x 15   Past Interventions Not Performed Today: Supine ball squeeze 2x10 - 5 sec hold Supine bridge 2x10 - 3 sec hold SKTC x 30 sec ea Supine hip add stretch x 30 sec ea Seated marches 2x30 sec Clamshells 2 x 15 bilateral green tband SLR x 10 bilateral    Therapeutic Activities: N/A   Manual Therapy: N/A  Neuromuscular re-ed: Tandem stance 2x30 sec ea High march x 2  laps in // FT EO on foam 2x30 sec   Self-care/Home Management: N/A                                PT Short Term Goals - 12/20/20 1047       PT SHORT TERM GOAL #1   Title Patient will be independent with initial HEP.    Baseline issued at eval    Status On-going    Target Date 12/20/20      PT SHORT TERM GOAL #2   Title Therapist will capture FOTO and set appropriate LTG.    Baseline no time at eval    Status Achieved    Target Date 12/20/20      PT SHORT TERM GOAL #3   Title Therapist will perform TUG and set appropriate LTG    Baseline no time at eval    Status Achieved    Target Date 12/20/20      PT SHORT TERM GOAL #4   Title Patient will  demonstrate at least 90 degrees of pain free Lt hip AROM to improve ability to complete squatting/bending activity.    Baseline see flowsheet    Status Achieved    Target Date 12/27/20               PT Long Term Goals - 12/13/20 1115       PT LONG TERM GOAL #1   Title Patient will demonstrate at least 4-/5 bilateral abductor strength to improve stability about the chain with prolonged walking/standing activity.    Baseline See flowsheet    Status New    Target Date 01/17/21      PT LONG TERM GOAL #2   Title Patient will score at least 45/56 on BERG to decrease her risk of falls.    Baseline 43    Status New    Target Date 01/17/21      PT LONG TERM GOAL #3   Title Patient will complete 5 x STS in less than 15 seconds to signify improvement in functional strength    Baseline 22.9    Status New    Target Date 01/17/21      PT LONG TERM GOAL #4   Title Pt will improve FOTO function score to no less than 60% as proxy for functional improvement    Baseline 47% function    Status New    Target Date 01/17/21      PT LONG TERM GOAL #5   Title Pt will decrease TUG time to no greater than 17 seconds for improved functional mobility and decreased fall risk    Baseline 27 seconds    Status New    Target Date 01/17/21                   Plan - 01/03/21 1036     Clinical Impression Statement Pt was able to complete prescribed exercises with improved tolerance and no change in baseline pain. Today's PT session focused on increasing balance and proximal hip strength for improving safety and functional mobility. She continues to show improvement in functional activity tolerance and strength/mobility, but does continue to need frequent rest breaks during session. PT will continue to progress exercises as tolerated, especially in standing, per POC.    PT Treatment/Interventions ADLs/Self Care Home Management;Cryotherapy;Moist Heat;Gait training;Stair training;Therapeutic  activities;Therapeutic exercise;Balance training;Neuromuscular re-education;Patient/family education;Manual techniques;Passive range of motion    PT Next  Visit Plan HEP, static balance training, hip strengthening; progress as able    PT Home Exercise Plan Access Code [email or web address]             Patient will benefit from skilled therapeutic intervention in order to improve the following deficits and impairments:  Abnormal gait, Difficulty walking, Decreased activity tolerance, Pain, Decreased balance, Decreased strength  Visit Diagnosis: Pain in left hip  Difficulty in walking, not elsewhere classified  History of falling  Muscle weakness (generalized)     Problem List Patient Active Problem List   Diagnosis Date Noted   Atherosclerosis of aorta ([place]) 10/25/2020   Arthralgia 10/25/2020   Hip pain, acute, left 10/01/2020   Fall 10/01/2020   Wrist contusion 10/01/2020   Hypokalemia 07/12/2020   Gastroenteritis 04/27/2020   Dehydration 04/27/2020   Urinary frequency 02/28/2020   Peroneal tendinitis 11/01/2019   Spontaneous rupture of extensor tendon of left foot 10/28/2019   Contusion of left foot 10/27/2019   Sinusitis 04/13/2019   Rash 12/05/2016   Dyspnea 04/24/2016   Tricuspid valve insufficiency 04/24/2016   Cerumen impaction 11/19/2015   Ear discomfort 11/13/2015   Grief at loss of child 09/14/2015   Well adult exam 09/14/2015   Dyslipidemia    PSVT (paroxysmal supraventricular tachycardia) (HCC)    PAF (paroxysmal atrial fibrillation) ([place])    Viral gastroenteritis 07/21/2015   Acute upper respiratory infection 04/17/2015   Edema 11/08/2014   Chronic venous insufficiency 11/08/2014   Diarrhea 05/01/2014   Mass of left thigh 12/14/2013   Microhematuria 12/14/2013   Long term (current) use of anticoagulants 08/19/2013   CAD (coronary artery disease) 05/21/2011   Atrial tachycardia ([place]) 11/19/2010   CHEST PAIN 08/07/2009   Vitamin D deficiency 04/19/2008    SUPERFICIAL THROMBOPHLEBITIS 10/20/2007   DM2 (diabetes mellitus, type 2) ([place]) 03/16/2007   HYPERCHOLESTEROLEMIA 03/15/2007   Anxiety 03/15/2007   Essential hypertension 03/15/2007   Mitral valve disorder 03/15/2007   BRONCHITIS 03/15/2007   HIATAL HERNIA 03/15/2007   Irritable bowel syndrome 03/15/2007   Osteoarthritis 03/15/2007   Osteoporosis 03/15/2007    Ward Chatters, PT 01/03/2021, 11:01 AM  [facility] [address] [place], [place], [address] Phone: [phone]   Fax:  [phone]  Name: ELMO SHUMARD MRN: [id number] Date of Birth: 12-27-1934

## 2021-01-04 ENCOUNTER — Ambulatory Visit (INDEPENDENT_AMBULATORY_CARE_PROVIDER_SITE_OTHER): Payer: Medicare HMO

## 2021-01-04 DIAGNOSIS — I10 Essential (primary) hypertension: Secondary | ICD-10-CM

## 2021-01-04 DIAGNOSIS — E119 Type 2 diabetes mellitus without complications: Secondary | ICD-10-CM

## 2021-01-04 NOTE — Patient Instructions (Signed)
[name],  It was great to talk to you today!  Please call me with any questions or concerns.  Visit Information   PATIENT GOALS:   Goals Addressed             This Visit's Progress    Monitor and Manage My Blood Sugar-Diabetes Type 2       Timeframe:  Long-Range Goal Priority:  High Start Date:   01/04/2021                          Expected End Date:  07/04/2021                     Follow Up Date 03/01/2021   - check blood sugar at prescribed times - check blood sugar if I feel it is too high or too low - enter blood sugar readings and medication or insulin into daily log - take the blood sugar log to all doctor visits - take the blood sugar meter to all doctor visits    Why is this important?   Checking your blood sugar at home helps to keep it from getting very high or very low.  Writing the results in a diary or log helps the doctor know how to care for you.  Your blood sugar log should have the time, date and the results.  Also, write down the amount of insulin or other medicine that you take.  Other information, like what you ate, exercise done and how you were feeling, will also be helpful.       Track and Manage My Blood Pressure-Hypertension       Timeframe:  Long-Range Goal Priority:  High Start Date:   01/04/2021                          Expected End Date:  07/04/2021                     Follow Up Date 03/01/2021   - check blood pressure daily - choose a place to take my blood pressure (home, clinic or office, retail store) - write blood pressure results in a log or diary    Why is this important?   You won't feel high blood pressure, but it can still hurt your blood vessels.  High blood pressure can cause heart or kidney problems. It can also cause a stroke.  Making lifestyle changes like losing a little weight or eating less salt will help.  Checking your blood pressure at home and at different times of the day can help to control blood pressure.   If the doctor prescribes medicine remember to take it the way the doctor ordered.  Call the office if you cannot afford the medicine or if there are questions about it.     Notes:         Consent to CCM Services: [name] was given information about Chronic Care Management services including:  CCM service includes personalized support from designated clinical staff supervised by her physician, including individualized plan of care and coordination with other care providers 24/7 contact phone numbers for assistance for urgent and routine care needs. Service will only be billed when office clinical staff spend 20 minutes or more in a month to coordinate care. Only one practitioner may furnish and bill the service in a calendar month. The patient may stop CCM  services at any time (effective at the end of the month) by phone call to the office staff. The patient will be responsible for cost sharing (co-pay) of up to 20% of the service fee (after annual deductible is met).  Patient agreed to services and verbal consent obtained.   The patient verbalized understanding of instructions, educational materials, and care plan provided today and declined offer to receive copy of patient instructions, educational materials, and care plan.   Telephone follow up appointment with care management team member scheduled for: 2 months The patient has been provided with contact information for the care management team and has been advised to call with any health related questions or concerns.   Tomasa Blase, PharmD Clinical Pharmacist, [place]     CLINICAL CARE PLAN: Patient Care Plan: CCM Care Plan     Problem Identified: Hypertension, Hyperlipidemia, Diabetes, Atrial Fibrillation, Chronic Kidney Disease, Anxiety, GERD, and Pain from fall   Priority: High  Onset Date: 01/04/2021     Long-Range Goal: Disease Management   Start Date: 01/04/2021  Expected End Date: 07/04/2021   This Visit's Progress: On track  Priority: High  Note:   Current Barriers:  Unable to independently monitor therapeutic efficacy  Pharmacist Clinical Goal(s):  Patient will achieve adherence to monitoring guidelines and medication adherence to achieve therapeutic efficacy maintain control of LDL, BP, and BG as evidenced by next lipid panel /BP and BG logs   through collaboration with PharmD and provider.   Interventions: 1:1 collaboration with Plotnikov, Evie Lacks, MD regarding development and update of comprehensive plan of care as evidenced by provider attestation and co-signature Inter-disciplinary care team collaboration (see longitudinal plan of care) Comprehensive medication review performed; medication list updated in electronic medical record  Lab Results  Component Value Date   HGBA1C 7.1 (H) 03/28/2020    Hypertension (BP goal <140/90) -Unknown at this time -Current treatment: Metoprolol Succinate 50mg  - 1.5 tablets twice daily  Amlodipine 5mg  - 1 tablet daily Isosorbide Mononitrate 30mg  - 1 tablet daily  Furosemide 20mg  - 1 tablet daily as needed - taking 2-3 times a week   -Medications previously tried: n/a  -Current home readings: 130/ unknown - patient could not recall -Current dietary habits: reports that she tries not to add salt to her foods, tries to eat a sodium reduced diet - does get meals on wheels where she cannot control the sodium content  -Current exercise habits: has not been very active since her fall in June, is slowly improving and increasing her activity- follows with PT once weekly   -Denies hypotensive/hypertensive symptoms -Educated on BP goals and benefits of medications for prevention of heart attack, stroke and kidney damage; Daily salt intake goal < 2300 mg; Exercise goal of 150 minutes per week; Importance of home blood pressure monitoring; Proper BP monitoring technique; Symptoms of hypotension and importance of maintaining adequate  hydration; -Counseled to monitor BP at home daily, document, and provide log at future appointments -Counseled on diet and exercise extensively Recommended to continue current medication  Hyperlipidemia: (LDL goal < 100) -Controlled Lab Results  Component Value Date   LDLCALC 85 10/29/2020  -Current treatment: Simvastatin 20mg  - 1 tablet daily  Aspirin 81mg  - 1 tablet daily  -Medications previously tried: n/a  -Current dietary patterns: notes that she does eat red meat on occasion, gets some of her meals through meals on wheels so does not always have a choice as to what she is eating  -Current exercise  habits: has not been very active since her fall in June, is slowly improving and increasing her activity- follows with PT once weekly   -Educated on Cholesterol goals;  Benefits of statin for ASCVD risk reduction; Importance of limiting foods high in cholesterol; Exercise goal of 150 minutes per week; -Counseled on diet and exercise extensively Recommended to continue current medication  Diabetes (A1c goal <7%) -Controlled Lab Results  Component Value Date   HGBA1C 7.1 (H) 03/28/2020  -Current medications: N/a -Medications previously tried: metformin, repaglinide  -Current home glucose readings fasting glucose: 120, 125, 130 -Denies hypoglycemic/hyperglycemic symptoms -Current meal patterns:  breakfast: eggs with a piece of bacon / beef Inda Merlin with grits  with fruit - 1 cup of decaf coffee  lunch: meals on wheels, at times can be a sandwich with a side dinner: protein, potatoes, greens, salad snacks: fruit, chips, peanuts drinks: ginger ale on occasion / water  -Current exercise: has not been very active since her fall in June, is slowly improving and increasing her activity- follows with PT once weekly   -Educated on A1c and blood sugar goals; Complications of diabetes including kidney damage, retinal damage, and cardiovascular disease; Exercise goal of 150 minutes per  week; Benefits of routine self-monitoring of blood sugar; Carbohydrate counting and/or plate method -Counseled to check feet daily and get yearly eye exams -Counseled on diet and exercise extensively -Patient to continue to monitor blood sugars, will add medication in future if necessary   Atrial Fibrillation (Goal: prevent stroke and major bleeding) -Controlled -CHADSVASC: 4 -Current treatment: Rate control: Metoprolol Succinate 50mg  - 1.5 tablets twice daily  Anticoagulation: Warfarin 5mg  - taken as directed  -Medications previously tried: n/a -Home BP and HR readings: unknown at this time, could only recall that SBP is usually averaging 130-140  -Counseled on increased risk of stroke due to Afib and benefits of anticoagulation for stroke prevention; importance of adherence to anticoagulant exactly as prescribed; bleeding risk associated with warfarin and importance of self-monitoring for signs/symptoms of bleeding; avoidance of NSAIDs due to increased bleeding risk with anticoagulants; importance of regular laboratory monitoring; seeking medical attention after a head injury or if there is blood in the urine/stool; -Counseled on diet and exercise extensively Recommended to continue current medication  Anxiety (Goal: Prevention/control of anxiety attacks) -Controlled -Current treatment: Alprazolam 0.5mg  - 1/2-1 tablet twice daily as needed  -Medications previously tried/failed: diazepam  -GAD7: 0 -Educated on Benefits of medication for symptom control Benefits of cognitive-behavioral therapy with or without medication -Recommended to continue current medication  GERD (Goal: Prevention/control of acid reflux) -Controlled -Current treatment  Pantoprazole 40mg  - 1 tablet daily as needed -Medications previously tried: maalox  -Counseled on diet and exercise extensively Recommended to continue current medication  CKD (Goal: Prevention of disease progression ) -Stable -Last  eGFR 41.28 mL/min -Current treatment  Counseled patient on avoidance of nephrotoxic agents such as NSAIDs, also advised on adequate BP and BG control to prevent kidney damage  -Recommended to continue current medication  Pain from fall (Goal: Pain control) -Controlled -Current treatment  Tramadol 50mg  - 1-2 tablet daily as needed  -Medications previously tried: APAP, voltaren gel, voltaren, nabumetone, norco  -Recommended to continue current medication   Health Maintenance -Vaccine gaps: Shingles, COVID booster, influenza vaccine -Current therapy:  Isosopt Tears 2.5% ophthalmic solution - 1 drop into both eyes daily as needed  Coricidin  - 1 tablet every 6 hours as needed for cough and cold  Vitamin D3 2000 units - 1  tablet daily  -Educated on Cost vs benefit of each product must be carefully weighed by individual consumer -Patient is satisfied with current therapy and denies issues -Recommended to continue current medication  Patient Goals/Self-Care Activities Patient will:  - take medications as prescribed check glucose daily, document, and provide at future appointments check blood pressure daily, document, and provide at future appointments engage in dietary modifications by reducing sodium, / high cholesterol foods / moderating of carbohydrate intake  Follow Up Plan: Telephone follow up appointment with care management team member scheduled for: 2 months The patient has been provided with contact information for the care management team and has been advised to call with any health related questions or concerns.

## 2021-01-04 NOTE — Progress Notes (Addendum)
Chronic Care Management Pharmacy Note  01/04/2021 Name:  [name] MRN:  [id number] DOB:  September 14, 1934  Summary: -Patient reports that she has been following with PT, continuing to build strength and recovering from fall that occurred in June -Patient reports that he daughter helps keeps her medications in order, comes to her house and fills up her medication boxes every 2 weeks -Patient monitors her blood pressure and blood sugars typically once daily  -Reports that pain has been fairly well controlled, has not had to use tramadol very often -Using alprazolam 1-2 times daily as needed, but anxiety is controlled with current regimen -Patient restarted simvastatin, has had no issues since restarting   Recommendations/Changes made from today's visit: -Recommending for patient to continue monitoring blood pressure and blood sugars, patient to write down results in a log so that they can be reviewed with next appointment, reviewed BP and BG goals with patient, patient to reach out before next appointment should BP >140/90 or BG >160-170 -Patient to continue as needed use of alprazolam for her anxiety, will continue to follow with PT to improve strength and mobility  -Likely patient would benefit from updated DEXA scan, has been on alendronate in the past, but unable to tolerate, taking daily vitamin d at this time  -Also would recommend updated microalbumin with next PCP appointment as well   Subjective: [name] is an 86 y.o. year old female who is a primary patient of Plotnikov, Georgina Quint, MD.  The CCM team was consulted for assistance with disease management and care coordination needs.    Engaged with patient by telephone for initial visit in response to provider referral for pharmacy case management and/or care coordination services.   Consent to Services:  The patient was given the following information about Chronic Care Management services today, agreed to services, and  gave verbal consent: 1. CCM service includes personalized support from designated clinical staff supervised by the primary care provider, including individualized plan of care and coordination with other care providers 2. 24/7 contact phone numbers for assistance for urgent and routine care needs. 3. Service will only be billed when office clinical staff spend 20 minutes or more in a month to coordinate care. 4. Only one practitioner may furnish and bill the service in a calendar month. 5.The patient may stop CCM services at any time (effective at the end of the month) by phone call to the office staff. 6. The patient will be responsible for cost sharing (co-pay) of up to 20% of the service fee (after annual deductible is met). Patient agreed to services and consent obtained.  Patient Care Team: Plotnikov, Georgina Quint, MD as PCP - General (Internal Medicine) Rennis Golden [name], MD as PCP - Cardiology (Cardiology) Sallye Lat, MD as Consulting Physician (Ophthalmology) Erlene Quan Vinnie Level, [facility] as Pharmacist (Pharmacist)  Recent office visits:  10/25/20-Aleksei V. Plotnikov, MD (PCP) Seen for annual exam. Labs ordered. Hold simvastatin for 2-3 weeks due to arthralgia - follow up with PT if it does not improve. Follow up in 3 months. 10/01/20-Aleksei V. Plotnikov, MD (PCP) Seen for hip pain after fall in driveway Left wrist xray ordered. Tramadol 50 mg every 6 hours. Follow up in 4 weeks. 07/12/20-Aleksei V. Plotnikov, MD (PCP) Seen for 3 month follow up. Follow up in 3 months.   Recent consult visits:  12/27/20-David Prudencio Burly, PT (Outpatient Rehab) 12/20/20-Samantha C. Pexa, PT (Outpatient Rehab) 12/13/20-David Prudencio Burly, PT (Outpatient Rehab) 12/12/20-Michael Dapp, RN (Cardiology) Anti-coag visit  12/06/20-Samantha C. Pexa, PT (Outpatient Rehab)  10/24/20-Michael Dapp, RN (Cardiology) Anti-coag visit 10/10/20-Kristin L. Alvstad (Cardiology) Anti-coag visit 09/27/20-Kenneth Hilty MD (Cardiology)  Follow up for palpitations. Continue metoprolol 75mg  twice daily as patient felt unwell on 100mg  twice daily and has improved on 75mg  dose  09/05/20-Michael Dapp, RN (Cardiology) Anti-coag visit. 07/25/20-Michael Dapp, RN (Cardiology) Anti-coag visit.   Hospital visits:  None in previous 6 months  Objective:  Lab Results  Component Value Date   CREATININE 1.21 (H) 10/29/2020   BUN 18 10/29/2020   GFR 41.28 (L) 10/29/2020   GFRNONAA >60 06/23/2020   GFRAA 54 (L) 10/30/2019   NA 138 10/29/2020   K 4.2 10/29/2020   CALCIUM 9.2 10/29/2020   CO2 28 10/29/2020   GLUCOSE 122 (H) 10/29/2020    Lab Results  Component Value Date/Time   HGBA1C 7.1 (H) 03/28/2020 11:43 AM   HGBA1C 7.4 (H) 02/07/2020 10:16 AM   GFR 41.28 (L) 10/29/2020 09:41 AM   GFR 44.44 (L) 07/12/2020 02:46 PM    Last diabetic Eye exam:  Lab Results  Component Value Date/Time   HMDIABEYEEXA No Retinopathy 04/17/2017 04:31 PM    Last diabetic Foot exam:  No results found for: HMDIABFOOTEX   Lab Results  Component Value Date   CHOL 168 10/29/2020   HDL 66.40 10/29/2020   LDLCALC 85 10/29/2020   LDLDIRECT 105.5 11/26/2010   TRIG 82.0 10/29/2020   CHOLHDL 3 10/29/2020    Hepatic Function Latest Ref Rng & Units 10/29/2020 07/12/2020 05/08/2020  Total Protein 6.0 - 8.3 g/dL 6.8 7.2 7.7  Albumin 3.5 - 5.2 g/dL 4.1 4.4 4.2  AST 0 - 37 U/L 16 15 33  ALT 0 - 35 U/L 10 10 20   Alk Phosphatase 39 - 117 U/L 83 59 54  Total Bilirubin 0.2 - 1.2 mg/dL 0.4 0.4 0.6  Bilirubin, Direct 0.0 - 0.2 mg/dL - - 0.3(H)    Lab Results  Component Value Date/Time   TSH 1.39 10/29/2020 09:41 AM   TSH 0.856 05/24/2020 03:06 PM   TSH 2.445 03/04/2020 03:26 AM   TSH 1.00 05/02/2019 11:05 AM    CBC Latest Ref Rng & Units 10/29/2020 06/23/2020 05/24/2020  WBC 4.0 - 10.5 K/uL 4.4 6.4 6.8  Hemoglobin 12.0 - 15.0 g/dL 11.9(L) 12.3 11.3(L)  Hematocrit 36.0 - 46.0 % 35.4(L) 38.6 34.1(L)  Platelets 150.0 - 400.0 K/uL 202.0 243 234     Lab Results  Component Value Date/Time   VD25OH 38.3 11/18/2018 11:32 AM   VD25OH 42 08/04/2013 10:28 AM    Clinical ASCVD: No  The ASCVD Risk score (Arnett DK, et al., 2019) failed to calculate for the following reasons:   The 2019 ASCVD risk score is only valid for ages 63 to 80    Depression screen PHQ 2/9 07/12/2020 11/29/2019 11/29/2019  Decreased Interest 0 0 0  Down, Depressed, Hopeless 0 0 0  PHQ - 2 Score 0 0 0  Altered sleeping - - -  Tired, decreased energy - - -  Change in appetite - - -  Feeling bad or failure about yourself  - - -  Trouble concentrating - - -  Moving slowly or fidgety/restless - - -  Suicidal thoughts - - -  PHQ-9 Score - - -  Difficult doing work/chores - - -  Some recent data might be hidden    Social History   Tobacco Use  Smoking Status Never  Smokeless Tobacco Never   BP Readings from Last 3  Encounters:  10/25/20 130/62  10/01/20 (!) 172/90  09/27/20 (!) 144/82   Pulse Readings from Last 3 Encounters:  10/25/20 (!) 55  10/01/20 65  09/27/20 60   Wt Readings from Last 3 Encounters:  10/25/20 184 lb 12.8 oz (83.8 kg)  10/01/20 182 lb (82.6 kg)  09/27/20 182 lb (82.6 kg)   BMI Readings from Last 3 Encounters:  10/25/20 29.83 kg/m  10/01/20 29.38 kg/m  09/27/20 35.54 kg/m    Assessment/Interventions: Review of patient past medical history, allergies, medications, health status, including review of consultants reports, laboratory and other test data, was performed as part of comprehensive evaluation and provision of chronic care management services.   SDOH:  (Social Determinants of Health) assessments and interventions performed: Yes  SDOH Screenings   Alcohol Screen: Not on file  Depression (PHQ2-9): Low Risk    PHQ-2 Score: 0  Financial Resource Strain: Low Risk    Difficulty of Paying Living Expenses: Not very hard  Food Insecurity: Not on file  Housing: Not on file  Physical Activity: Not on file  Social  Connections: Not on file  Stress: Not on file  Tobacco Use: Low Risk    Smoking Tobacco Use: Never   Smokeless Tobacco Use: Never  Transportation Needs: Not on file    CCM Care Plan  Allergies  Allergen Reactions   Clarithromycin     REACTION: unspecified   Fosamax [Alendronate Sodium]     achy   Guaifenesin Er     itching   Metformin     REACTION: pt states INTOL to Metformin "felt drained"   Prandin [Repaglinide]     Prandin dropped CBGs to 66 once - she stopped it.   Tape     Adhesive tape---rash    Medications Reviewed Today     Reviewed by Tomasa Blase, [facility] (Pharmacist) on 01/04/21 at 1158  Med List Status: <None>   Medication Order Taking? Sig Documenting Provider Last Dose Status Informant  ACCU-CHEK AVIVA PLUS test strip [id number] Yes USE TO CHECK BLOOD SUGARS DAILY Plotnikov, Evie Lacks, MD Taking Active   Accu-Chek Softclix Lancets lancets [id number] Yes USE  AS  INSTRUCTED Plotnikov, Evie Lacks, MD Taking Active   Alcohol Swabs (B-D SINGLE USE SWABS REGULAR) PADS [id number] Yes USE TO CLEAN INJECTION SITE TO CHECK BLOOD SUGARS DAILY Plotnikov, Evie Lacks, MD Taking Active   ALPRAZolam Duanne Moron) 0.5 MG tablet [id number] Yes TAKE 1/2 TO 1 TABLET BY MOUTH THREE TIMES DAILY AS NEEDED FOR ANXIETY. DO NOT TAKE WITH TRAMADOL.THIS IS A 30 DAY SUPPLY Plotnikov, Evie Lacks, MD Taking Active   amLODipine (NORVASC) 5 MG tablet [id number] Yes TAKE 1 TABLET(5 MG) BY MOUTH DAILY Plotnikov, Evie Lacks, MD Taking Active   aspirin 81 MG tablet [id number] Yes Take 81 mg by mouth daily. [provider] Taking Active Multiple Informants  Chlorphen-Pseudoephed-APAP (CORICIDIN D PO) [id number] Yes Take 1 tablet by mouth every 6 (six) hours as needed (cough or cold). [provider]  Active   Cholecalciferol (VITAMIN D3) 50 MCG (2000 UT) capsule [id number] Yes TAKE 1 CAPSULE BY MOUTH ONCE DAILY Plotnikov, Evie Lacks, MD Taking Active   diclofenac Sodium (VOLTAREN) 1 % GEL [id number] No  Apply 2 g topically 4 (four) times daily.  Patient not taking: Reported on 01/04/2021   Aundra Dubin, PA-C Not Taking Active   furosemide (LASIX) 20 MG tablet [id number] Yes TAKE 1 TABLET(20 MG) BY MOUTH DAILY AS NEEDED FOR SWELLING  Patient taking differently: Take 20  mg by mouth daily as needed for edema (swelling).   Pixie Casino, MD Taking Active   hydroxypropyl methylcellulose (ISOPTO TEARS) 2.5 % ophthalmic solution [id number] Yes Place 1 drop into both eyes 2 (two) times daily. [provider] Taking Active Multiple Informants  hydrOXYzine (ATARAX/VISTARIL) 25 MG tablet [id number] No Take 1 tablet (25 mg total) by mouth every 8 (eight) hours as needed for itching.  Patient not taking: Reported on 01/04/2021   Plotnikov, Evie Lacks, MD Not Taking Active   isosorbide mononitrate (IMDUR) 30 MG 24 hr tablet [id number] Yes TAKE 1 TABLET BY MOUTH EVERY DAY Hilty, Nadean Corwin, MD Taking Active   metoprolol succinate (TOPROL-XL) 50 MG 24 hr tablet [id number] Yes Takes 1&1/2 tablets twice a day Pixie Casino, MD Taking Active   pantoprazole (PROTONIX) 40 MG tablet [id number] Yes TAKE 1 TABLET BY MOUTH EVERY DAY  Patient taking differently: Take 40 mg by mouth daily as needed.   Pixie Casino, MD Taking Active   simvastatin (ZOCOR) 20 MG tablet [id number] Yes TAKE 1 TABLET BY MOUTH AT BEDTIME Plotnikov, Evie Lacks, MD Taking Active   traMADol (ULTRAM) 50 MG tablet [id number] Yes Take 1 tablet (50 mg total) by mouth 3 (three) times daily as needed. Plotnikov, Evie Lacks, MD Taking Active   warfarin (COUMADIN) 5 MG tablet [id number] Yes TAKE 1 TABLET BY MOUTH EVERY DAY AS DIRECTED BY COUMADIN CLINIC Hilty, Nadean Corwin, MD Taking Active             Patient Active Problem List   Diagnosis Date Noted   Atherosclerosis of aorta ([place]) 10/25/2020   Arthralgia 10/25/2020   Hip pain, acute, left 10/01/2020   Fall 10/01/2020   Wrist contusion 10/01/2020   Hypokalemia 07/12/2020    Gastroenteritis 04/27/2020   Dehydration 04/27/2020   Urinary frequency 02/28/2020   Peroneal tendinitis 11/01/2019   Spontaneous rupture of extensor tendon of left foot 10/28/2019   Contusion of left foot 10/27/2019   Sinusitis 04/13/2019   Rash 12/05/2016   Dyspnea 04/24/2016   Tricuspid valve insufficiency 04/24/2016   Cerumen impaction 11/19/2015   Ear discomfort 11/13/2015   Grief at loss of child 09/14/2015   Well adult exam 09/14/2015   Dyslipidemia    PSVT (paroxysmal supraventricular tachycardia) (H[facility])    PAF (paroxysmal atrial fibrillation) ([place])    Viral gastroenteritis 07/21/2015   Acute upper respiratory infection 04/17/2015   Edema 11/08/2014   Chronic venous insufficiency 11/08/2014   Diarrhea 05/01/2014   Mass of left thigh 12/14/2013   Microhematuria 12/14/2013   Long term (current) use of anticoagulants 08/19/2013   CAD (coronary artery disease) 05/21/2011   Atrial tachycardia ([place]) 11/19/2010   CHEST PAIN 08/07/2009   Vitamin D deficiency 04/19/2008   SUPERFICIAL THROMBOPHLEBITIS 10/20/2007   DM2 (diabetes mellitus, type 2) ([place]) 03/16/2007   HYPERCHOLESTEROLEMIA 03/15/2007   Anxiety 03/15/2007   Essential hypertension 03/15/2007   Mitral valve disorder 03/15/2007   BRONCHITIS 03/15/2007   HIATAL HERNIA 03/15/2007   Irritable bowel syndrome 03/15/2007   Osteoarthritis 03/15/2007   Osteoporosis 03/15/2007    Immunization History  Administered Date(s) Administered   Fluad Quad(high Dose 65+) 02/15/2019, 02/07/2020   Influenza Split 01/21/2011, 02/24/2012   Influenza Whole 02/02/2009, 05/21/2010   Influenza, High Dose Seasonal PF 03/18/2016, 03/31/2017, 02/10/2018   Influenza,inj,Quad PF,6+ Mos 01/11/2013, 05/01/2014, 02/08/2015   Moderna SARS-COV2 Booster Vaccination 08/03/2020   Moderna Sars-Covid-2 Vaccination 06/13/2019, 07/11/2019   Tdap 10/01/2020    Conditions to be addressed/monitored:  Hypertension,  Hyperlipidemia, Diabetes, Atrial  Fibrillation, Chronic Kidney Disease, Anxiety, GERD, and Pain from fall  Care Plan : Normal  Updates made by Tomasa Blase, RPH since 01/04/2021 12:00 AM     Problem: Hypertension, Hyperlipidemia, Diabetes, Atrial Fibrillation, Chronic Kidney Disease, Anxiety, GERD, and Pain from fall   Priority: High  Onset Date: 01/04/2021     Long-Range Goal: Disease Management   Start Date: 01/04/2021  Expected End Date: 07/04/2021  This Visit's Progress: On track  Priority: High  Note:   Current Barriers:  Unable to independently monitor therapeutic efficacy  Pharmacist Clinical Goal(s):  Patient will achieve adherence to monitoring guidelines and medication adherence to achieve therapeutic efficacy maintain control of LDL, BP, and BG as evidenced by next lipid panel /BP and BG logs   through collaboration with PharmD and provider.   Interventions: 1:1 collaboration with Plotnikov, Evie Lacks, MD regarding development and update of comprehensive plan of care as evidenced by provider attestation and co-signature Inter-disciplinary care team collaboration (see longitudinal plan of care) Comprehensive medication review performed; medication list updated in electronic medical record  Lab Results  Component Value Date   HGBA1C 7.1 (H) 03/28/2020    Hypertension (BP goal <140/90) -Unknown at this time -Current treatment: Metoprolol Succinate 50mg  - 1.5 tablets twice daily  Amlodipine 5mg  - 1 tablet daily Isosorbide Mononitrate 30mg  - 1 tablet daily  Furosemide 20mg  - 1 tablet daily as needed - taking 2-3 times a week   -Medications previously tried: n/a  -Current home readings: 130/ unknown - patient could not recall -Current dietary habits: reports that she tries not to add salt to her foods, tries to eat a sodium reduced diet - does get meals on wheels where she cannot control the sodium content  -Current exercise habits: has not been very active since her fall in June, is slowly  improving and increasing her activity- follows with PT once weekly   -Denies hypotensive/hypertensive symptoms -Educated on BP goals and benefits of medications for prevention of heart attack, stroke and kidney damage; Daily salt intake goal < 2300 mg; Exercise goal of 150 minutes per week; Importance of home blood pressure monitoring; Proper BP monitoring technique; Symptoms of hypotension and importance of maintaining adequate hydration; -Counseled to monitor BP at home daily, document, and provide log at future appointments -Counseled on diet and exercise extensively Recommended to continue current medication  Hyperlipidemia: (LDL goal < 100) -Controlled Lab Results  Component Value Date   LDLCALC 85 10/29/2020  -Current treatment: Simvastatin 20mg  - 1 tablet daily  Aspirin 81mg  - 1 tablet daily  -Medications previously tried: n/a  -Current dietary patterns: notes that she does eat red meat on occasion, gets some of her meals through meals on wheels so does not always have a choice as to what she is eating  -Current exercise habits: has not been very active since her fall in June, is slowly improving and increasing her activity- follows with PT once weekly   -Educated on Cholesterol goals;  Benefits of statin for ASCVD risk reduction; Importance of limiting foods high in cholesterol; Exercise goal of 150 minutes per week; -Counseled on diet and exercise extensively Recommended to continue current medication  Diabetes (A1c goal <7%) -Controlled Lab Results  Component Value Date   HGBA1C 7.1 (H) 03/28/2020  -Current medications: N/a -Medications previously tried: metformin, repaglinide  -Current home glucose readings fasting glucose: 120, 125, 130 -Denies hypoglycemic/hyperglycemic symptoms -Current meal patterns:  breakfast: eggs with a piece of  bacon / beef Inda Merlin with grits  with fruit - 1 cup of decaf coffee  lunch: meals on wheels, at times can be a sandwich with a  side dinner: protein, potatoes, greens, salad snacks: fruit, chips, peanuts drinks: ginger ale on occasion / water  -Current exercise: has not been very active since her fall in June, is slowly improving and increasing her activity- follows with PT once weekly   -Educated on A1c and blood sugar goals; Complications of diabetes including kidney damage, retinal damage, and cardiovascular disease; Exercise goal of 150 minutes per week; Benefits of routine self-monitoring of blood sugar; Carbohydrate counting and/or plate method -Counseled to check feet daily and get yearly eye exams -Counseled on diet and exercise extensively -Patient to continue to monitor blood sugars, will add medication in future if necessary   Atrial Fibrillation (Goal: prevent stroke and major bleeding) -Controlled -CHADSVASC: 4 -Current treatment: Rate control: Metoprolol Succinate 50mg  - 1.5 tablets twice daily  Anticoagulation: Warfarin 5mg  - taken as directed  -Medications previously tried: n/a -Home BP and HR readings: unknown at this time, could only recall that SBP is usually averaging 130-140  -Counseled on increased risk of stroke due to Afib and benefits of anticoagulation for stroke prevention; importance of adherence to anticoagulant exactly as prescribed; bleeding risk associated with warfarin and importance of self-monitoring for signs/symptoms of bleeding; avoidance of NSAIDs due to increased bleeding risk with anticoagulants; importance of regular laboratory monitoring; seeking medical attention after a head injury or if there is blood in the urine/stool; -Counseled on diet and exercise extensively Recommended to continue current medication  Anxiety (Goal: Prevention/control of anxiety attacks) -Controlled -Current treatment: Alprazolam 0.5mg  - 1/2-1 tablet twice daily as needed  -Medications previously tried/failed: diazepam  -GAD7: 0 -Educated on Benefits of medication for symptom  control Benefits of cognitive-behavioral therapy with or without medication -Recommended to continue current medication  GERD (Goal: Prevention/control of acid reflux) -Controlled -Current treatment  Pantoprazole 40mg  - 1 tablet daily as needed -Medications previously tried: maalox  -Counseled on diet and exercise extensively Recommended to continue current medication  CKD (Goal: Prevention of disease progression ) -Stable -Last eGFR 41.28 mL/min -Current treatment  Counseled patient on avoidance of nephrotoxic agents such as NSAIDs, also advised on adequate BP and BG control to prevent kidney damage  -Recommended to continue current medication  Pain from fall (Goal: Pain control) -Controlled -Current treatment  Tramadol 50mg  - 1-2 tablet daily as needed  -Medications previously tried: APAP, voltaren gel, voltaren, nabumetone, norco  -Recommended to continue current medication   Health Maintenance -Vaccine gaps: Shingles, COVID booster, influenza vaccine -Current therapy:  Isosopt Tears 2.5% ophthalmic solution - 1 drop into both eyes daily as needed  Coricidin  - 1 tablet every 6 hours as needed for cough and cold  Vitamin D3 2000 units - 1 tablet daily  -Educated on Cost vs benefit of each product must be carefully weighed by individual consumer -Patient is satisfied with current therapy and denies issues -Recommended to continue current medication  Patient Goals/Self-Care Activities Patient will:  - take medications as prescribed check glucose daily, document, and provide at future appointments check blood pressure daily, document, and provide at future appointments engage in dietary modifications by reducing sodium, / high cholesterol foods / moderating of carbohydrate intake  Follow Up Plan: Telephone follow up appointment with care management team member scheduled for: 2 months The patient has been provided with contact information for the care management team and  has  been advised to call with any health related questions or concerns.        Medication Assistance: None required.  Patient affirms current coverage meets needs.  Patient's preferred pharmacy is:  [facility] [phone] - [place], [place] - [phone] AT [place] [address] Phone: [phone] Fax: [phone]  [place], [place] [address] Phone: [phone] Fax: [phone]   Uses pill box? Yes Pt endorses 100% compliance  Care Plan and Follow Up Patient Decision:  Patient agrees to Care Plan and Follow-up.  Plan: Telephone follow up appointment with care management team member scheduled for:  2 months and The patient has been provided with contact information for the care management team and has been advised to call with any health related questions or concerns.   Tomasa Blase, PharmD Clinical Pharmacist, [place] screening examination/treatment/procedure(s) were performed by non-physician practitioner and as supervising physician I was immediately available for consultation/collaboration.  I agree with above. Lew Dawes, MD

## 2021-01-10 ENCOUNTER — Ambulatory Visit: Payer: Medicare HMO

## 2021-01-10 ENCOUNTER — Other Ambulatory Visit: Payer: Self-pay

## 2021-01-10 DIAGNOSIS — M25552 Pain in left hip: Secondary | ICD-10-CM | POA: Diagnosis not present

## 2021-01-10 DIAGNOSIS — M6281 Muscle weakness (generalized): Secondary | ICD-10-CM | POA: Diagnosis not present

## 2021-01-10 DIAGNOSIS — Z9181 History of falling: Secondary | ICD-10-CM

## 2021-01-10 DIAGNOSIS — R262 Difficulty in walking, not elsewhere classified: Secondary | ICD-10-CM | POA: Diagnosis not present

## 2021-01-10 NOTE — Therapy (Signed)
[place], [place], [address] Phone: [phone]   Fax:  [phone]  Physical Therapy Treatment  Patient Details  Name: [name] MRN: [id number] Date of Birth: 08-17-1934 Referring Provider (PT): Plotnikov, Evie Lacks, MD   Encounter Date: 01/10/2021   PT End of Session - 01/10/21 0932     Visit Number 6    Number of Visits 7    Date for PT Re-Evaluation 01/19/21    Authorization Type [employer] -    Authorization Time Period 8/25-10/7    Authorization - Visit Number 5    Authorization - Number of Visits 6    PT Start Time 0932    PT Stop Time 1015    PT Time Calculation (min) 43 min    Activity Tolerance Patient tolerated treatment well    Behavior During Therapy [facility] for tasks assessed/performed             Past Medical History:  Diagnosis Date   Anxiety state, unspecified    Bronchitis, not specified as acute or chronic    Diaphragmatic hernia without mention of obstruction or gangrene    Dyslipidemia    Essential hypertension    Irritable bowel syndrome    Mitral valve disorders(424.0)    a. 08/2010 Echo: EF >55%, mild MR, mod TR, trace AI.   Non-obstructive CAD    a. 07/2010 Lexiscan MV: no ischemia, EF 78%; b. 07/2010 Cath: LM nl, LAD50/60p, 40 apical, LCX nl, RCA dominant, 30/40p, 57m.   Osteoarthrosis, unspecified whether generalized or localized, unspecified site    Osteoporosis, unspecified    Other abnormal glucose    PAF (paroxysmal atrial fibrillation) (HCC)    a. CHA2DS2VASc = 4-->coumadin.   Phlebitis and thrombophlebitis of superficial vessels of lower extremities    PSVT (paroxysmal supraventricular tachycardia) ([place])    a. 05/2012 Holter: short bursts of PSVT noted-->Managed with toprol.   Unspecified venous (peripheral) insufficiency    Unspecified vitamin D deficiency     Past Surgical History:  Procedure Laterality Date   CARDIAC CATHETERIZATION  07/30/2010    nonobstructive CAD, 20-40% RCA stenosis, 50-60% eccentric LAD stenosis more prox, 40% LAD stenosis more distal, EF 65%   TRANSTHORACIC ECHOCARDIOGRAM  09/03/2010   EF=>55%; mild MR; mod TR; AV mildly sclerotic with trace regurg; mild pulm valve regurg   VESICOVAGINAL FISTULA CLOSURE W/ TAH      There were no vitals filed for this visit.   Subjective Assessment - 01/10/21 0934     Subjective Patient reports she is doing pretty good. No pain currently and denies any falls. She reports occasional twinge in her hip, otherwise has been feeling good.    Currently in Pain? No/denies                [facility] PT Assessment - 01/10/21 0001       Observation/Other Assessments   Focus on Therapeutic Outcomes (FOTO)  60%               [facility] Adult PT Treatment/Exercise:   Therapeutic Exercise:  Nustep level 5 UE/LE x 5 minutes  LAQ 2 x 10 3 lbs bilateral  Standing hip abd 3 lbs bilateral  Standing hip ext 2x10 bilateral 3lbs Standing hamstring curls 2 x 10 3 lbs    Past Interventions Not Performed Today: Supine ball squeeze 2x10 - 5 sec hold STS 2x10- no UE support Heel-Toe raises x 15 Supine bridge 2x10 - 3 sec hold SKTC  x 30 sec ea Supine hip add stretch x 30 sec ea Seated marches 2x30 sec Clamshells 2 x 15 bilateral green tband SLR x 10 bilateral    Therapeutic Activities: N/A   Manual Therapy: N/A  Neuromuscular re-ed:  Not performed today*:  Tandem stance 2x30 sec ea High march x 2 laps in // FT EO on foam 2x30 sec   Self-care/Home Management: Significant time spent administering FOTO outcome survey and discussing results                        PT Education - 01/10/21 1012     Education Details See selfcare    Person(s) Educated Patient    Methods Explanation    Comprehension Verbalized understanding              PT Short Term Goals - 01/10/21 1007       PT SHORT TERM GOAL #1   Title Patient will be independent with initial  HEP.    Baseline issued at eval    Status Achieved    Target Date 12/20/20      PT SHORT TERM GOAL #2   Title Therapist will capture FOTO and set appropriate LTG.    Baseline no time at eval    Status Achieved    Target Date 12/20/20      PT SHORT TERM GOAL #3   Title Therapist will perform TUG and set appropriate LTG    Baseline no time at eval    Status Achieved    Target Date 12/20/20      PT SHORT TERM GOAL #4   Title Patient will demonstrate at least 90 degrees of pain free Lt hip AROM to improve ability to complete squatting/bending activity.    Baseline see flowsheet    Status Achieved    Target Date 12/27/20               PT Long Term Goals - 01/10/21 1007       PT LONG TERM GOAL #1   Title Patient will demonstrate at least 4-/5 bilateral abductor strength to improve stability about the chain with prolonged walking/standing activity.    Baseline See flowsheet    Status Deferred      PT LONG TERM GOAL #2   Title Patient will score at least 45/56 on BERG to decrease her risk of falls.    Baseline 43    Status Deferred      PT LONG TERM GOAL #3   Title Patient will complete 5 x STS in less than 15 seconds to signify improvement in functional strength    Baseline 22.9    Status Deferred      PT LONG TERM GOAL #4   Title Pt will improve FOTO function score to no less than 60% as proxy for functional improvement    Baseline 47% function    Status Achieved      PT LONG TERM GOAL #5   Title Pt will decrease TUG time to no greater than 17 seconds for improved functional mobility and decreased fall risk    Baseline 27 seconds    Status Deferred                   Plan - 01/10/21 0940     Clinical Impression Statement Patient's FOTO score has much improved compared to baseline having met predicted functional outcome score. Continued with BLE strengthening with patient toleraring ther ex well  today. Improved tolerance to standing ther ex requiring no  rest breaks during today's session.    PT Treatment/Interventions ADLs/Self Care Home Management;Cryotherapy;Moist Heat;Gait training;Stair training;Therapeutic activities;Therapeutic exercise;Balance training;Neuromuscular re-education;Patient/family education;Manual techniques;Passive range of motion    PT Next Visit Plan re-eval; HEP, static balance training, hip strengthening; progress as able    PT Home Exercise Plan Access Code [email or web address]    Consulted and Agree with Plan of Care Patient             Patient will benefit from skilled therapeutic intervention in order to improve the following deficits and impairments:  Abnormal gait, Difficulty walking, Decreased activity tolerance, Pain, Decreased balance, Decreased strength  Visit Diagnosis: Pain in left hip  Difficulty in walking, not elsewhere classified  History of falling  Muscle weakness (generalized)     Problem List Patient Active Problem List   Diagnosis Date Noted   Atherosclerosis of aorta ([place]) 10/25/2020   Arthralgia 10/25/2020   Hip pain, acute, left 10/01/2020   Fall 10/01/2020   Wrist contusion 10/01/2020   Hypokalemia 07/12/2020   Gastroenteritis 04/27/2020   Dehydration 04/27/2020   Urinary frequency 02/28/2020   Peroneal tendinitis 11/01/2019   Spontaneous rupture of extensor tendon of left foot 10/28/2019   Contusion of left foot 10/27/2019   Sinusitis 04/13/2019   Rash 12/05/2016   Dyspnea 04/24/2016   Tricuspid valve insufficiency 04/24/2016   Cerumen impaction 11/19/2015   Ear discomfort 11/13/2015   Grief at loss of child 09/14/2015   Well adult exam 09/14/2015   Dyslipidemia    PSVT (paroxysmal supraventricular tachycardia) (HCC)    PAF (paroxysmal atrial fibrillation) ([place])    Viral gastroenteritis 07/21/2015   Acute upper respiratory infection 04/17/2015   Edema 11/08/2014   Chronic venous insufficiency 11/08/2014   Diarrhea 05/01/2014   Mass of left thigh 12/14/2013    Microhematuria 12/14/2013   Long term (current) use of anticoagulants 08/19/2013   CAD (coronary artery disease) 05/21/2011   Atrial tachycardia ([place]) 11/19/2010   CHEST PAIN 08/07/2009   Vitamin D deficiency 04/19/2008   SUPERFICIAL THROMBOPHLEBITIS 10/20/2007   DM2 (diabetes mellitus, type 2) ([place]) 03/16/2007   HYPERCHOLESTEROLEMIA 03/15/2007   Anxiety 03/15/2007   Essential hypertension 03/15/2007   Mitral valve disorder 03/15/2007   BRONCHITIS 03/15/2007   HIATAL HERNIA 03/15/2007   Irritable bowel syndrome 03/15/2007   Osteoarthritis 03/15/2007   Osteoporosis 03/15/2007   Gwendolyn Grant, PT, DPT, ATC 01/10/21 10:17 AM  [facility] [address] [place], [place], [address] Phone: [phone]   Fax:  [phone]  Name: [name] MRN: [id number] Date of Birth: [birth year]

## 2021-01-11 DIAGNOSIS — E119 Type 2 diabetes mellitus without complications: Secondary | ICD-10-CM | POA: Diagnosis not present

## 2021-01-11 DIAGNOSIS — I10 Essential (primary) hypertension: Secondary | ICD-10-CM

## 2021-01-16 ENCOUNTER — Other Ambulatory Visit: Payer: Self-pay | Admitting: Urology

## 2021-01-16 DIAGNOSIS — D4102 Neoplasm of uncertain behavior of left kidney: Secondary | ICD-10-CM

## 2021-01-17 ENCOUNTER — Other Ambulatory Visit: Payer: Self-pay

## 2021-01-17 ENCOUNTER — Ambulatory Visit: Payer: Medicare HMO | Attending: Internal Medicine

## 2021-01-17 DIAGNOSIS — M6281 Muscle weakness (generalized): Secondary | ICD-10-CM | POA: Diagnosis not present

## 2021-01-17 DIAGNOSIS — M25552 Pain in left hip: Secondary | ICD-10-CM

## 2021-01-17 DIAGNOSIS — Z9181 History of falling: Secondary | ICD-10-CM

## 2021-01-17 DIAGNOSIS — R262 Difficulty in walking, not elsewhere classified: Secondary | ICD-10-CM | POA: Diagnosis not present

## 2021-01-17 NOTE — Therapy (Signed)
[place], [place], [address] Phone: [phone]   Fax:  [phone]  Physical Therapy Treatment/Re-evaluation/Discharge  Patient Details  Name: [name] MRN: [id number] Date of Birth: February 17, 1935 Referring Provider (PT): Plotnikov, Evie Lacks, MD   Encounter Date: 01/17/2021   PT End of Session - 01/17/21 1043     Visit Number 7    Number of Visits 7    Date for PT Re-Evaluation 01/19/21    Authorization Type [employer] -    Authorization Time Period 8/25-10/7    Authorization - Visit Number 6    Authorization - Number of Visits 6    PT Start Time 1017    PT Stop Time 1043    PT Time Calculation (min) 26 min    Activity Tolerance Patient tolerated treatment well    Behavior During Therapy [facility] for tasks assessed/performed             Past Medical History:  Diagnosis Date   Anxiety state, unspecified    Bronchitis, not specified as acute or chronic    Diaphragmatic hernia without mention of obstruction or gangrene    Dyslipidemia    Essential hypertension    Irritable bowel syndrome    Mitral valve disorders(424.0)    a. 08/2010 Echo: EF >55%, mild MR, mod TR, trace AI.   Non-obstructive CAD    a. 07/2010 Lexiscan MV: no ischemia, EF 78%; b. 07/2010 Cath: LM nl, LAD50/60p, 40 apical, LCX nl, RCA dominant, 30/40p, 38m   Osteoarthrosis, unspecified whether generalized or localized, unspecified site    Osteoporosis, unspecified    Other abnormal glucose    PAF (paroxysmal atrial fibrillation) (HCC)    a. CHA2DS2VASc = 4-->coumadin.   Phlebitis and thrombophlebitis of superficial vessels of lower extremities    PSVT (paroxysmal supraventricular tachycardia) ([place]    a. 05/2012 Holter: short bursts of PSVT noted-->Managed with toprol.   Unspecified venous (peripheral) insufficiency    Unspecified vitamin D deficiency     Past Surgical History:  Procedure Laterality Date   CARDIAC  CATHETERIZATION  07/30/2010   nonobstructive CAD, 20-40% RCA stenosis, 50-60% eccentric LAD stenosis more prox, 40% LAD stenosis more distal, EF 65%   TRANSTHORACIC ECHOCARDIOGRAM  09/03/2010   EF=>55%; mild MR; mod TR; AV mildly sclerotic with trace regurg; mild pulm valve regurg   VESICOVAGINAL FISTULA CLOSURE W/ TAH      There were no vitals filed for this visit.   Subjective Assessment - 01/17/21 1019     Subjective Patient reports she is has been doing pretty good. She reports no hip pain or recent falls. She feels that she is ready for discharge at this time.    How long can you sit comfortably? sitting is ok    How long can you stand comfortably? 15-20 minutes    How long can you walk comfortably? 15-20 minutes    Currently in Pain? No/denies                [facility] - 01/17/21 0001       Assessment   Medical Diagnosis M25.50 (ICD-10-CM) - Arthralgia, unspecified joint  M25.552 (ICD-10-CM) - Hip pain, acute, left  M47.816 (ICD-10-CM) - Osteoarthritis of lumbar spine, unspecified spinal osteoarthritis complication status  [id number] [place](ICD-10-CM) - Fall, sequela    Referring Provider (PT) Plotnikov, AEvie Lacks MD      Observation/Other Assessments   Focus on Therapeutic Outcomes (FOTO)  60%  Strength   Right Hip Flexion 4+/5    Right Hip ABduction 4/5    Left Hip Flexion 4+/5    Left Hip ABduction 4-/5      Transfers   Five time sit to stand comments  18.5      Berg Balance Test   Sit to Stand Able to stand without using hands and stabilize independently    Standing Unsupported Able to stand safely 2 minutes    Sitting with Back Unsupported but Feet Supported on Floor or Stool Able to sit safely and securely 2 minutes    Stand to Sit Sits safely with minimal use of hands    Transfers Able to transfer safely, minor use of hands    Standing Unsupported with Eyes Closed Able to stand 10 seconds safely    Standing Unsupported with Feet Together Able to  place feet together independently and stand 1 minute safely    From Standing, Reach Forward with Outstretched Arm Can reach forward >12 cm safely (5")    From Standing Position, Pick up Object from Floor Able to pick up shoe safely and easily    From Standing Position, Turn to Look Behind Over each Shoulder Looks behind from both sides and weight shifts well    Turn 360 Degrees Able to turn 360 degrees safely in 4 seconds or less    Standing Unsupported, Alternately Place Feet on Step/Stool Able to stand independently and safely and complete 8 steps in 20 seconds    Standing Unsupported, One Foot in Front Able to plae foot ahead of the other independently and hold 30 seconds    Standing on One Leg Able to lift leg independently and hold 5-10 seconds    Total Score 53      Timed Up and Go Test   Normal TUG (seconds) 13.6                           [facility] Adult PT Treatment/Exercise - 01/17/21 0001       Self-Care   Self-Care Other Self-Care Comments    Other Self-Care Comments  see patient education      Neuro Re-ed    Neuro Re-ed Details  See BERG and TUG for high level balance                     PT Education - 01/17/21 1043     Education Details Education on re-assessment findings. D/C education. reviewed HEP.    Person(s) Educated Patient    Methods Explanation    Comprehension Verbalized understanding              PT Short Term Goals - 01/10/21 1007       PT SHORT TERM GOAL #1   Title Patient will be independent with initial HEP.    Baseline issued at eval    Status Achieved    Target Date 12/20/20      PT SHORT TERM GOAL #2   Title Therapist will capture FOTO and set appropriate LTG.    Baseline no time at eval    Status Achieved    Target Date 12/20/20      PT SHORT TERM GOAL #3   Title Therapist will perform TUG and set appropriate LTG    Baseline no time at eval    Status Achieved    Target Date 12/20/20      PT SHORT TERM  GOAL #4     Title Patient will demonstrate at least 90 degrees of pain free Lt hip AROM to improve ability to complete squatting/bending activity.    Baseline see flowsheet    Status Achieved    Target Date 12/27/20               PT Long Term Goals - 01/17/21 1021       PT LONG TERM GOAL #1   Title Patient will demonstrate at least 4-/5 bilateral abductor strength to improve stability about the chain with prolonged walking/standing activity.    Baseline See flowsheet    Status Achieved      PT LONG TERM GOAL #2   Title Patient will score at least 45/56 on BERG to decrease her risk of falls.    Baseline 43    Status Achieved      PT LONG TERM GOAL #3   Title Patient will complete 5 x STS in less than 15 seconds to signify improvement in functional strength    Baseline 22.9    Status On-going      PT LONG TERM GOAL #4   Title Pt will improve FOTO function score to no less than 60% as proxy for functional improvement    Baseline 47% function    Status Achieved      PT LONG TERM GOAL #5   Title Pt will decrease TUG time to no greater than 17 seconds for improved functional mobility and decreased fall risk    Baseline 27 seconds    Status Achieved                   Plan - 01/17/21 1045     Clinical Impression Statement [name] has made excellent functional progress since the start of care having met all functional goals with exception of 5xSTS goal. Her scores have improved on all fall risk assessments including the BERG, TUG, and 5xSTS. Based on her BERG she scores at a low fall risk and while she still scores at moderate risk based upon her TUG and 5xSTS these scores have significantly reduced compared to baseline. She demonstrates improvements in hip ROM and strength. She is pleased with her overall progress in PT at this time and is appropriate for D/C to home program.    PT Treatment/Interventions ADLs/Self Care Home Management;Cryotherapy;Moist Heat;Gait  training;Stair training;Therapeutic activities;Therapeutic exercise;Balance training;Neuromuscular re-education;Patient/family education;Manual techniques;Passive range of motion    PT Next Visit Plan --    PT Home Exercise Plan Access Code [email or web address]    Consulted and Agree with Plan of Care Patient             Patient will benefit from skilled therapeutic intervention in order to improve the following deficits and impairments:  Abnormal gait, Difficulty walking, Decreased activity tolerance, Pain, Decreased balance, Decreased strength  Visit Diagnosis: Pain in left hip  Difficulty in walking, not elsewhere classified  History of falling  Muscle weakness (generalized)     Problem List Patient Active Problem List   Diagnosis Date Noted   Atherosclerosis of aorta (HCC) 10/25/2020   Arthralgia 10/25/2020   Hip pain, acute, left 10/01/2020   Fall 10/01/2020   Wrist contusion 10/01/2020   Hypokalemia 07/12/2020   Gastroenteritis 04/27/2020   Dehydration 04/27/2020   Urinary frequency 02/28/2020   Peroneal tendinitis 11/01/2019   Spontaneous rupture of extensor tendon of left foot 10/28/2019   Contusion of left foot 10/27/2019   Sinusitis 04/13/2019   Rash 12/05/2016   Dyspnea 04/24/2016   Tricuspid   valve insufficiency 04/24/2016   Cerumen impaction 11/19/2015   Ear discomfort 11/13/2015   Grief at loss of child 09/14/2015   Well adult exam 09/14/2015   Dyslipidemia    PSVT (paroxysmal supraventricular tachycardia) (HCC)    PAF (paroxysmal atrial fibrillation) (HCC)    Viral gastroenteritis 07/21/2015   Acute upper respiratory infection 04/17/2015   Edema 11/08/2014   Chronic venous insufficiency 11/08/2014   Diarrhea 05/01/2014   Mass of left thigh 12/14/2013   Microhematuria 12/14/2013   Long term (current) use of anticoagulants 08/19/2013   CAD (coronary artery disease) 05/21/2011   Atrial tachycardia (HCC) 11/19/2010   CHEST PAIN 08/07/2009   Vitamin D  deficiency 04/19/2008   SUPERFICIAL THROMBOPHLEBITIS 10/20/2007   DM2 (diabetes mellitus, type 2) (HCC) 03/16/2007   HYPERCHOLESTEROLEMIA 03/15/2007   Anxiety 03/15/2007   Essential hypertension 03/15/2007   Mitral valve disorder 03/15/2007   BRONCHITIS 03/15/2007   HIATAL HERNIA 03/15/2007   Irritable bowel syndrome 03/15/2007   Osteoarthritis 03/15/2007   Osteoporosis 03/15/2007  PHYSICAL THERAPY DISCHARGE SUMMARY  Visits from Start of Care: 7  Current functional level related to goals / functional outcomes: See goals above   Remaining deficits: See impression above   Education / Equipment: See above   Patient agrees to discharge. Patient goals were partially met. Patient is being discharged due to being pleased with the current functional level.  Samantha Pexa, PT, DPT, ATC 01/17/21 10:52 AM   [facility] [address] [place], [place], [address] Phone: [phone]   Fax:  [phone]  Name: [name] MRN: [id number] Date of Birth: 04/14/1934    

## 2021-01-23 ENCOUNTER — Other Ambulatory Visit: Payer: Self-pay

## 2021-01-23 ENCOUNTER — Ambulatory Visit (INDEPENDENT_AMBULATORY_CARE_PROVIDER_SITE_OTHER): Payer: Medicare HMO

## 2021-01-23 DIAGNOSIS — I480 Paroxysmal atrial fibrillation: Secondary | ICD-10-CM | POA: Diagnosis not present

## 2021-01-23 DIAGNOSIS — Z7901 Long term (current) use of anticoagulants: Secondary | ICD-10-CM

## 2021-01-23 LAB — POCT INR: INR: 2.4 (ref 2.0–3.0)

## 2021-01-23 NOTE — Patient Instructions (Signed)
Continue taking 1 tablet daily except 1/2 tablet each Sunday.  Repeat INR in 7 weeks.  *Consistent amount of greens every week*

## 2021-01-29 ENCOUNTER — Other Ambulatory Visit: Payer: Self-pay

## 2021-01-29 ENCOUNTER — Encounter: Payer: Self-pay | Admitting: Internal Medicine

## 2021-01-29 ENCOUNTER — Ambulatory Visit (INDEPENDENT_AMBULATORY_CARE_PROVIDER_SITE_OTHER): Payer: Medicare HMO | Admitting: Internal Medicine

## 2021-01-29 VITALS — BP 132/70 | HR 64 | Temp 97.7°F | Ht 66.0 in | Wt 191.0 lb

## 2021-01-29 DIAGNOSIS — I2583 Coronary atherosclerosis due to lipid rich plaque: Secondary | ICD-10-CM

## 2021-01-29 DIAGNOSIS — E785 Hyperlipidemia, unspecified: Secondary | ICD-10-CM | POA: Diagnosis not present

## 2021-01-29 DIAGNOSIS — R609 Edema, unspecified: Secondary | ICD-10-CM

## 2021-01-29 DIAGNOSIS — I2510 Atherosclerotic heart disease of native coronary artery without angina pectoris: Secondary | ICD-10-CM | POA: Diagnosis not present

## 2021-01-29 DIAGNOSIS — E119 Type 2 diabetes mellitus without complications: Secondary | ICD-10-CM | POA: Diagnosis not present

## 2021-01-29 DIAGNOSIS — F419 Anxiety disorder, unspecified: Secondary | ICD-10-CM

## 2021-01-29 DIAGNOSIS — Z23 Encounter for immunization: Secondary | ICD-10-CM | POA: Diagnosis not present

## 2021-01-29 DIAGNOSIS — W19XXXS Unspecified fall, sequela: Secondary | ICD-10-CM | POA: Diagnosis not present

## 2021-01-29 MED ORDER — ALPRAZOLAM 0.5 MG PO TABS: ORAL_TABLET | ORAL | 3 refills | Status: DC

## 2021-01-29 NOTE — Assessment & Plan Note (Signed)
No relapse 

## 2021-01-29 NOTE — Assessment & Plan Note (Signed)
Mild. Use compression socks

## 2021-01-29 NOTE — Assessment & Plan Note (Signed)
Cont on Simvastatin, Toprol, Isosorbide, Amlodipine

## 2021-01-29 NOTE — Assessment & Plan Note (Signed)
Back on Simvastatin

## 2021-01-29 NOTE — Assessment & Plan Note (Signed)
Cont on Xanax prn  Potential benefits of a long term benzodiazepines  use as well as potential risks  and complications were explained to the patient and were aknowledged. Not to take w/Tramadol 

## 2021-01-29 NOTE — Assessment & Plan Note (Signed)
Monitor A1c On diet 

## 2021-01-29 NOTE — Progress Notes (Signed)
Subjective:  Patient ID: [name], female    DOB: 1934/10/22  Age: 86 y.o. MRN: [id number]  CC: Follow-up (3 month f/u)   HPI KYNDLE SCHLENDER presents for arthralgias - better, dyslipidemia, L hip pain s/p fall, DM, anxiety f/u  Outpatient Medications Prior to Visit  Medication Sig Dispense Refill   ACCU-CHEK AVIVA PLUS test strip USE TO CHECK BLOOD SUGARS DAILY 100 strip 2   Accu-Chek Softclix Lancets lancets USE  AS  INSTRUCTED 100 each 2   Alcohol Swabs (B-D SINGLE USE SWABS REGULAR) PADS USE TO CLEAN INJECTION SITE TO CHECK BLOOD SUGARS DAILY 100 each 2   amLODipine (NORVASC) 5 MG tablet TAKE 1 TABLET(5 MG) BY MOUTH DAILY 90 tablet 3   aspirin 81 MG tablet Take 81 mg by mouth daily.     Cholecalciferol (VITAMIN D3) 50 MCG (2000 UT) capsule TAKE 1 CAPSULE BY MOUTH ONCE DAILY 100 capsule 3   furosemide (LASIX) 20 MG tablet TAKE 1 TABLET(20 MG) BY MOUTH DAILY AS NEEDED FOR SWELLING (Patient taking differently: Take 20 mg by mouth daily as needed for edema (swelling).) 30 tablet 3   hydroxypropyl methylcellulose (ISOPTO TEARS) 2.5 % ophthalmic solution Place 1 drop into both eyes 2 (two) times daily.     isosorbide mononitrate (IMDUR) 30 MG 24 hr tablet TAKE 1 TABLET BY MOUTH EVERY DAY 90 tablet 3   metoprolol succinate (TOPROL-XL) 50 MG 24 hr tablet Takes 1&1/2 tablets twice a day 90 tablet 6   pantoprazole (PROTONIX) 40 MG tablet TAKE 1 TABLET BY MOUTH EVERY DAY (Patient taking differently: Take 40 mg by mouth daily as needed.) 90 tablet 3   simvastatin (ZOCOR) 20 MG tablet TAKE 1 TABLET BY MOUTH AT BEDTIME 90 tablet 1   traMADol (ULTRAM) 50 MG tablet Take 1 tablet (50 mg total) by mouth 3 (three) times daily as needed. 90 tablet 2   warfarin (COUMADIN) 5 MG tablet TAKE 1 TABLET BY MOUTH EVERY DAY AS DIRECTED BY COUMADIN CLINIC 90 tablet 0   ALPRAZolam (XANAX) 0.5 MG tablet TAKE 1/2 TO 1 TABLET BY MOUTH THREE TIMES DAILY AS NEEDED FOR ANXIETY. DO NOT TAKE WITH TRAMADOL.THIS IS A  30 DAY SUPPLY 90 tablet 1   diclofenac Sodium (VOLTAREN) 1 % GEL Apply 2 g topically 4 (four) times daily. (Patient not taking: No sig reported) 150 g 1   Chlorphen-Pseudoephed-APAP (CORICIDIN D PO) Take 1 tablet by mouth every 6 (six) hours as needed (cough or cold). (Patient not taking: Reported on 01/29/2021)     hydrOXYzine (ATARAX/VISTARIL) 25 MG tablet Take 1 tablet (25 mg total) by mouth every 8 (eight) hours as needed for itching. (Patient not taking: No sig reported) 60 tablet 0   No facility-administered medications prior to visit.    ROS: Review of Systems  Constitutional:  Negative for activity change, appetite change, chills, fatigue and unexpected weight change.  HENT:  Negative for congestion, mouth sores and sinus pressure.   Eyes:  Negative for visual disturbance.  Respiratory:  Negative for cough and chest tightness.   Gastrointestinal:  Negative for abdominal pain and nausea.  Genitourinary:  Negative for difficulty urinating, frequency and vaginal pain.  Musculoskeletal:  Positive for arthralgias. Negative for back pain and gait problem.  Skin:  Negative for pallor and rash.  Neurological:  Negative for dizziness, tremors, weakness, numbness and headaches.  Psychiatric/Behavioral:  Negative for confusion, sleep disturbance and suicidal ideas. The patient is nervous/anxious.    Objective:  BP  132/70 (BP Location: Left Arm)   Pulse 64   Temp 97.7 F (36.5 C) (Oral)   Ht 5\' 6"  (1.676 m)   Wt 191 lb (86.6 kg)   SpO2 98%   BMI 30.83 kg/m   BP Readings from Last 3 Encounters:  01/29/21 132/70  10/25/20 130/62  10/01/20 (!) 172/90    Wt Readings from Last 3 Encounters:  01/29/21 191 lb (86.6 kg)  10/25/20 184 lb 12.8 oz (83.8 kg)  10/01/20 182 lb (82.6 kg)    Physical Exam Constitutional:      General: She is not in acute distress.    Appearance: She is well-developed.  HENT:     Head: Normocephalic.     Right Ear: External ear normal.     Left Ear:  External ear normal.     Nose: Nose normal.  Eyes:     General:        Right eye: No discharge.        Left eye: No discharge.     Conjunctiva/sclera: Conjunctivae normal.     Pupils: Pupils are equal, round, and reactive to light.  Neck:     Thyroid: No thyromegaly.     Vascular: No JVD.     Trachea: No tracheal deviation.  Cardiovascular:     Rate and Rhythm: Normal rate and regular rhythm.     Heart sounds: Normal heart sounds.  Pulmonary:     Effort: No respiratory distress.     Breath sounds: No stridor. No wheezing.  Abdominal:     General: Bowel sounds are normal. There is no distension.     Palpations: Abdomen is soft. There is no mass.     Tenderness: no abdominal tenderness There is no guarding or rebound.  Musculoskeletal:        General: No tenderness.     Cervical back: Normal range of motion and neck supple. No rigidity.  Lymphadenopathy:     Cervical: No cervical adenopathy.  Skin:    Findings: No erythema or rash.  Neurological:     Mental Status: She is oriented to person, place, and time.     Cranial Nerves: No cranial nerve deficit.     Motor: No abnormal muscle tone.     Coordination: Coordination normal.     Deep Tendon Reflexes: Reflexes normal.  Psychiatric:        Behavior: Behavior normal.        Thought Content: Thought content normal.        Judgment: Judgment normal.  Antalgic gait Trace edema  Lab Results  Component Value Date   WBC 4.4 10/29/2020   HGB 11.9 (L) 10/29/2020   HCT 35.4 (L) 10/29/2020   PLT 202.0 10/29/2020   GLUCOSE 122 (H) 10/29/2020   CHOL 168 10/29/2020   TRIG 82.0 10/29/2020   HDL 66.40 10/29/2020   LDLDIRECT 105.5 11/26/2010   LDLCALC 85 10/29/2020   ALT 10 10/29/2020   AST 16 10/29/2020   NA 138 10/29/2020   K 4.2 10/29/2020   CL 105 10/29/2020   CREATININE 1.21 (H) 10/29/2020   BUN 18 10/29/2020   CO2 28 10/29/2020   TSH 1.39 10/29/2020   INR 2.4 01/23/2021   HGBA1C 7.1 (H) 03/28/2020    MM 3D SCREEN  BREAST BILATERAL  Result Date: 11/06/2020 CLINICAL DATA:  Screening. EXAM: DIGITAL SCREENING BILATERAL MAMMOGRAM WITH TOMOSYNTHESIS AND CAD TECHNIQUE: Bilateral screening digital craniocaudal and mediolateral oblique mammograms were obtained. Bilateral screening digital breast tomosynthesis was  performed. The images were evaluated with computer-aided detection. COMPARISON:  Previous exam(s). ACR Breast Density Category c: The breast tissue is heterogeneously dense, which may obscure small masses. FINDINGS: There are no findings suspicious for malignancy. IMPRESSION: No mammographic evidence of malignancy. A result letter of this screening mammogram will be mailed directly to the patient. RECOMMENDATION: Screening mammogram in one year. (Code:SM-B-01Y) BI-RADS CATEGORY  1: Negative. Electronically Signed   By: Franki Cabot M.D.   On: 11/06/2020 10:36    Assessment & Plan:   Problem List Items Addressed This Visit     Anxiety    Cont on Xanax prn  Potential benefits of a long term benzodiazepines  use as well as potential risks  and complications were explained to the patient and were aknowledged. Not to take w/Tramadol      Relevant Medications   ALPRAZolam (XANAX) 0.5 MG tablet   CAD (coronary artery disease)    Cont on Simvastatin, Toprol, Isosorbide, Amlodipine      Relevant Orders   CBC with Differential/Platelet   Comprehensive metabolic panel   TSH   Hemoglobin A1c   DM2 (diabetes mellitus, type 2) (HCC)    Monitor A1c On diet      Relevant Orders   Hemoglobin A1c   Dyslipidemia - Primary    Back on Simvastatin      Relevant Orders   Comprehensive metabolic panel   TSH   Edema    Mild. Use compression socks      Relevant Orders   CBC with Differential/Platelet   Comprehensive metabolic panel   TSH   Hemoglobin A1c   Fall    No relapse      Other Visit Diagnoses     Needs flu shot       Relevant Orders   Flu Vaccine QUAD High Dose(Fluad) (Completed)          Follow-up: Return in about 3 months (around 05/01/2021) for a follow-up visit.  Walker Kehr, MD

## 2021-02-04 ENCOUNTER — Other Ambulatory Visit: Payer: Self-pay | Admitting: Internal Medicine

## 2021-02-05 ENCOUNTER — Other Ambulatory Visit: Payer: Self-pay

## 2021-02-05 ENCOUNTER — Other Ambulatory Visit (INDEPENDENT_AMBULATORY_CARE_PROVIDER_SITE_OTHER): Payer: Medicare HMO

## 2021-02-05 DIAGNOSIS — I2510 Atherosclerotic heart disease of native coronary artery without angina pectoris: Secondary | ICD-10-CM | POA: Diagnosis not present

## 2021-02-05 DIAGNOSIS — R609 Edema, unspecified: Secondary | ICD-10-CM

## 2021-02-05 DIAGNOSIS — I2583 Coronary atherosclerosis due to lipid rich plaque: Secondary | ICD-10-CM

## 2021-02-05 DIAGNOSIS — E785 Hyperlipidemia, unspecified: Secondary | ICD-10-CM | POA: Diagnosis not present

## 2021-02-05 DIAGNOSIS — E119 Type 2 diabetes mellitus without complications: Secondary | ICD-10-CM | POA: Diagnosis not present

## 2021-02-05 LAB — CBC WITH DIFFERENTIAL/PLATELET
Basophils Absolute: 0 10*3/uL (ref 0.0–0.1)
Basophils Relative: 0.5 % (ref 0.0–3.0)
Eosinophils Absolute: 0.2 10*3/uL (ref 0.0–0.7)
Eosinophils Relative: 4 % (ref 0.0–5.0)
HCT: 37.6 % (ref 36.0–46.0)
Hemoglobin: 12.3 g/dL (ref 12.0–15.0)
Lymphocytes Relative: 36.1 % (ref 12.0–46.0)
Lymphs Abs: 1.6 10*3/uL (ref 0.7–4.0)
MCHC: 32.8 g/dL (ref 30.0–36.0)
MCV: 88.4 fl (ref 78.0–100.0)
Monocytes Absolute: 0.4 10*3/uL (ref 0.1–1.0)
Monocytes Relative: 9.5 % (ref 3.0–12.0)
Neutro Abs: 2.2 10*3/uL (ref 1.4–7.7)
Neutrophils Relative %: 49.9 % (ref 43.0–77.0)
Platelets: 201 10*3/uL (ref 150.0–400.0)
RBC: 4.26 Mil/uL (ref 3.87–5.11)
RDW: 13.8 % (ref 11.5–15.5)
WBC: 4.5 10*3/uL (ref 4.0–10.5)

## 2021-02-05 LAB — COMPREHENSIVE METABOLIC PANEL
ALT: 9 U/L (ref 0–35)
AST: 15 U/L (ref 0–37)
Albumin: 4.2 g/dL (ref 3.5–5.2)
Alkaline Phosphatase: 69 U/L (ref 39–117)
BUN: 12 mg/dL (ref 6–23)
CO2: 28 mEq/L (ref 19–32)
Calcium: 9 mg/dL (ref 8.4–10.5)
Chloride: 104 mEq/L (ref 96–112)
Creatinine, Ser: 1.08 mg/dL (ref 0.40–1.20)
GFR: 47.22 mL/min — ABNORMAL LOW (ref >60.00)
Glucose, Bld: 136 mg/dL — ABNORMAL HIGH (ref 70–99)
Potassium: 3.9 mEq/L (ref 3.5–5.1)
Sodium: 138 mEq/L (ref 135–145)
Total Bilirubin: 0.6 mg/dL (ref 0.2–1.2)
Total Protein: 7.2 g/dL (ref 6.0–8.3)

## 2021-02-05 LAB — TSH: TSH: 1.65 u[IU]/mL (ref 0.35–5.50)

## 2021-02-05 LAB — HEMOGLOBIN A1C: Hgb A1c MFr Bld: 7.6 % — ABNORMAL HIGH (ref 4.6–6.5)

## 2021-02-28 ENCOUNTER — Ambulatory Visit (INDEPENDENT_AMBULATORY_CARE_PROVIDER_SITE_OTHER): Payer: Medicare HMO

## 2021-02-28 DIAGNOSIS — Z0000 Encounter for general adult medical examination without abnormal findings: Secondary | ICD-10-CM | POA: Diagnosis not present

## 2021-02-28 NOTE — Progress Notes (Addendum)
Subjective:   [name] is a 86 y.o. female who presents for Medicare Annual (Subsequent) preventive examination.  Review of Systems     Cardiac Risk Factors include: advanced age (>72men, >27 women);diabetes mellitus;dyslipidemia;family history of premature cardiovascular disease;hypertension;obesity (BMI >30kg/m2)     Objective:    There were no vitals filed for this visit. There is no height or weight on file to calculate BMI.  Advanced Directives 02/28/2021 12/06/2020 06/23/2020 05/24/2020 05/20/2020 05/01/2020 03/04/2020  Does Patient Have a Medical Advance Directive? Yes Yes No No No No No  Type of Advance Directive Living will Living will - - - - -  Does patient want to make changes to medical advance directive? No - Patient declined - - - - - -  Would patient like information on creating a medical advance directive? - - - No - Patient declined - - No - Patient declined    Current Medications (verified) Outpatient Encounter Medications as of 02/28/2021  Medication Sig   ACCU-CHEK AVIVA PLUS test strip USE TO CHECK BLOOD SUGARS DAILY   Accu-Chek Softclix Lancets lancets USE  AS  INSTRUCTED   Alcohol Swabs (B-D SINGLE USE SWABS REGULAR) PADS USE TO CLEAN INJECTION SITE TO CHECK BLOOD SUGARS DAILY   ALPRAZolam (XANAX) 0.5 MG tablet TAKE 1/2 TO 1 TABLET BY MOUTH THREE TIMES DAILY AS NEEDED FOR ANXIETY. DO NOT TAKE WITH TRAMADOL.THIS IS A 30 DAY SUPPLY   amLODipine (NORVASC) 5 MG tablet TAKE 1 TABLET(5 MG) BY MOUTH DAILY   aspirin 81 MG tablet Take 81 mg by mouth daily.   Cholecalciferol (VITAMIN D3) 50 MCG (2000 UT) capsule TAKE 1 CAPSULE BY MOUTH ONCE DAILY   diclofenac Sodium (VOLTAREN) 1 % GEL Apply 2 g topically 4 (four) times daily. (Patient not taking: No sig reported)   furosemide (LASIX) 20 MG tablet TAKE 1 TABLET(20 MG) BY MOUTH DAILY AS NEEDED FOR SWELLING (Patient taking differently: Take 20 mg by mouth daily as needed for edema (swelling).)   hydroxypropyl  methylcellulose (ISOPTO TEARS) 2.5 % ophthalmic solution Place 1 drop into both eyes 2 (two) times daily.   isosorbide mononitrate (IMDUR) 30 MG 24 hr tablet TAKE 1 TABLET BY MOUTH EVERY DAY   metoprolol succinate (TOPROL-XL) 50 MG 24 hr tablet Takes 1&1/2 tablets twice a day   pantoprazole (PROTONIX) 40 MG tablet TAKE 1 TABLET BY MOUTH EVERY DAY (Patient taking differently: Take 40 mg by mouth daily as needed.)   simvastatin (ZOCOR) 20 MG tablet TAKE 1 TABLET BY MOUTH AT BEDTIME   traMADol (ULTRAM) 50 MG tablet Take 1 tablet (50 mg total) by mouth 3 (three) times daily as needed.   warfarin (COUMADIN) 5 MG tablet TAKE 1 TABLET BY MOUTH EVERY DAY AS DIRECTED BY COUMADIN CLINIC   No facility-administered encounter medications on file as of 02/28/2021.    Allergies (verified) Clarithromycin, Fosamax [alendronate sodium], Guaifenesin er, Metformin, Prandin [repaglinide], and Tape   History: Past Medical History:  Diagnosis Date   Anxiety state, unspecified    Bronchitis, not specified as acute or chronic    Diaphragmatic hernia without mention of obstruction or gangrene    Dyslipidemia    Essential hypertension    Irritable bowel syndrome    Mitral valve disorders(424.0)    a. 08/2010 Echo: EF >55%, mild MR, mod TR, trace AI.   Non-obstructive CAD    a. 07/2010 Lexiscan MV: no ischemia, EF 78%; b. 07/2010 Cath: LM nl, LAD50/60p, 40 apical, LCX nl, RCA dominant,  30/40p, 77m.   Osteoarthrosis, unspecified whether generalized or localized, unspecified site    Osteoporosis, unspecified    Other abnormal glucose    PAF (paroxysmal atrial fibrillation) (HCC)    a. CHA2DS2VASc = 4-->coumadin.   Phlebitis and thrombophlebitis of superficial vessels of lower extremities    PSVT (paroxysmal supraventricular tachycardia) ([place])    a. 05/2012 Holter: short bursts of PSVT noted-->Managed with toprol.   Unspecified venous (peripheral) insufficiency    Unspecified vitamin D deficiency    Past Surgical  History:  Procedure Laterality Date   CARDIAC CATHETERIZATION  07/30/2010   nonobstructive CAD, 20-40% RCA stenosis, 50-60% eccentric LAD stenosis more prox, 40% LAD stenosis more distal, EF 65%   TRANSTHORACIC ECHOCARDIOGRAM  09/03/2010   EF=>55%; mild MR; mod TR; AV mildly sclerotic with trace regurg; mild pulm valve regurg   VESICOVAGINAL FISTULA CLOSURE W/ TAH     Family History  Problem Relation Age of Onset   Stroke Mother    Stroke Father        also heart disease   Clotting disorder Paternal Grandmother        blood clot   Stroke Brother        also heart disease   Social History   Socioeconomic History   Marital status: Widowed    Spouse name: Not on file   Number of children: 4   Years of education: Not on file   Highest education level: Not on file  Occupational History   Occupation: retired    Fish farm manager: RETIRED  Tobacco Use   Smoking status: Never   Smokeless tobacco: Never  Vaping Use   Vaping Use: Never used  Substance and Sexual Activity   Alcohol use: No   Drug use: No   Sexual activity: Not Currently  Other Topics Concern   Not on file  Social History Narrative   Not on file   Social Determinants of Health   Financial Resource Strain: Low Risk    Difficulty of Paying Living Expenses: Not very hard  Food Insecurity: Not on file  Transportation Needs: Not on file  Physical Activity: Not on file  Stress: Not on file  Social Connections: Not on file    Tobacco Counseling Counseling given: Not Answered   Clinical Intake:  Pre-visit preparation completed: Yes  Pain : No/denies pain     Nutritional Risks: None Diabetes: Yes CBG done?: No Did pt. bring in CBG monitor from home?: No  How often do you need to have someone help you when you read instructions, pamphlets, or other written materials from your doctor or pharmacy?: 1 - Never What is the last grade level you completed in school?: 8th grade  Diabetic? yes  Interpreter Needed?:  No  Information entered by :: [name], LPN   Activities of Daily Living In your present state of health, do you have any difficulty performing the following activities: 02/28/2021  Hearing? N  Vision? N  Difficulty concentrating or making decisions? Y  Walking or climbing stairs? N  Dressing or bathing? N  Doing errands, shopping? N  Preparing Food and eating ? N  Using the Toilet? N  In the past six months, have you accidently leaked urine? N  Do you have problems with loss of bowel control? N  Managing your Medications? N  Managing your Finances? N  Housekeeping or managing your Housekeeping? N  Some recent data might be hidden    Patient Care Team: Plotnikov, Evie Lacks, MD as  PCP - General (Internal Medicine) Pixie Casino, MD as PCP - Cardiology (Cardiology) Warden Fillers, MD as Consulting Physician (Ophthalmology) Delice Bison Darnelle Maffucci, [facility] as Pharmacist (Pharmacist)  Indicate any recent Medical Services you may have received from other than [facility] providers in the past year (date may be approximate).     Assessment:   This is a routine wellness examination for [facility].  Hearing/Vision screen Vision Screening - Comments:: Patient wears glasses.   Eye exam done by: Warden Fillers, MD.  Dietary issues and exercise activities discussed: Current Exercise Habits: Home exercise routine, Type of exercise: Other - see comments (Continue physical therapy exercises), Time (Minutes): 20, Frequency (Times/Week): 2, Weekly Exercise (Minutes/Week): 40, Intensity: Mild, Exercise limited by: cardiac condition(s);orthopedic condition(s) (left hip pain)   Goals Addressed   None   Depression Screen PHQ 2/9 Scores 02/28/2021 07/12/2020 11/29/2019 11/29/2019 11/25/2018 10/29/2017 04/27/2017  PHQ - 2 Score 0 0 0 0 0 0 0  PHQ- 9 Score - - - - - 3 -    Fall Risk Fall Risk  02/28/2021 07/12/2020 11/29/2019 11/25/2018 11/09/2018  Falls in the past year? 1 0 0 0 0  Number falls in  past yr: 0 0 0 0 0  Injury with Fall? 0 0 0 - 0  Risk for fall due to : - - No Fall Risks Impaired balance/gait;Impaired mobility -  Follow up - Falls evaluation completed Falls evaluation completed Falls prevention discussed -    FALL RISK PREVENTION PERTAINING TO THE HOME:  Any stairs in or around the home? No  If so, are there any without handrails? No  Home free of loose throw rugs in walkways, pet beds, electrical cords, etc? Yes  Adequate lighting in your home to reduce risk of falls? Yes   ASSISTIVE DEVICES UTILIZED TO PREVENT FALLS:  Life alert? No  Use of a cane, walker or w/c? No  Grab bars in the bathroom? Yes  Shower chair or bench in shower? Yes  Elevated toilet seat or a handicapped toilet? Yes   TIMED UP AND GO:  Was the test performed? No .  Length of time to ambulate 10 feet: n/a sec.   Gait steady and fast without use of assistive device  Cognitive Function: Normal cognitive status assessed by direct observation by this Nurse Health Advisor. No abnormalities found.   MMSE - Mini Mental State Exam 10/29/2017  Orientation to time 5  Orientation to Place 5  Registration 3  Attention/ Calculation 4  Recall 1  Language- name 2 objects 2  Language- repeat 1  Language- follow 3 step command 3  Language- read & follow direction 1  Write a sentence 1  Copy design 1  Total score 27     6CIT Screen 11/25/2018  What Year? 0 points  What month? 0 points  What time? 0 points  Count back from 20 0 points  Months in reverse 4 points  Repeat phrase 4 points  Total Score 8    Immunizations Immunization History  Administered Date(s) Administered   Fluad Quad(high Dose 65+) 02/15/2019, 02/07/2020, 01/29/2021   Influenza Split 01/21/2011, 02/24/2012   Influenza Whole 02/02/2009, 05/21/2010   Influenza, High Dose Seasonal PF 03/18/2016, 03/31/2017, 02/10/2018   Influenza,inj,Quad PF,6+ Mos 01/11/2013, 05/01/2014, 02/08/2015   Moderna SARS-COV2 Booster  Vaccination 08/03/2020   Moderna Sars-Covid-2 Vaccination 06/13/2019, 07/11/2019   Pfizer Covid-19 Vaccine Bivalent Booster 77yrs & up 01/10/2021   Tdap 10/01/2020    TDAP status: Up to date  Flu  Vaccine status: Up to date  Pneumococcal vaccine status: Declined,  Education has been provided regarding the importance of this vaccine but patient still declined. Advised may receive this vaccine at local pharmacy or Health Dept. Aware to provide a copy of the vaccination record if obtained from local pharmacy or Health Dept. Verbalized acceptance and understanding.   Covid-19 vaccine status: Completed vaccines  Qualifies for Shingles Vaccine? Yes   Zostavax completed No   Shingrix Completed?: No.    Education has been provided regarding the importance of this vaccine. Patient has been advised to call insurance company to determine out of pocket expense if they have not yet received this vaccine. Advised may also receive vaccine at local pharmacy or Health Dept. Verbalized acceptance and understanding.  Screening Tests Health Maintenance  Topic Date Due   Pneumonia Vaccine 39+ Years old (1 - PCV) Never done   URINE MICROALBUMIN  Never done   Zoster Vaccines- Shingrix (1 of 2) Never done   OPHTHALMOLOGY EXAM  04/17/2018   FOOT EXAM  10/30/2018   DEXA SCAN  07/12/2021 (Originally 10/03/2001)   COVID-19 Vaccine (4 - Booster) 03/07/2021   TETANUS/TDAP  10/02/2030   INFLUENZA VACCINE  Completed   HPV VACCINES  Aged Out    Health Maintenance  Health Maintenance Due  Topic Date Due   Pneumonia Vaccine 5+ Years old (1 - PCV) Never done   URINE MICROALBUMIN  Never done   Zoster Vaccines- Shingrix (1 of 2) Never done   OPHTHALMOLOGY EXAM  04/17/2018   FOOT EXAM  10/30/2018    Colorectal cancer screening: No longer required.   Mammogram status: Completed 11/02/2020. Repeat every year  Bone Density Status: postponed  Lung Cancer Screening: (Low Dose CT Chest recommended if Age 2-80  years, 30 pack-year currently smoking OR have quit w/in 15years.) does not qualify.   Lung Cancer Screening Referral: no  Additional Screening:  Hepatitis C Screening: does not qualify; Completed: no  Vision Screening: Recommended annual ophthalmology exams for early detection of glaucoma and other disorders of the eye. Is the patient up to date with their annual eye exam?  Yes  Who is the provider or what is the name of the office in which the patient attends annual eye exams? Warden Fillers, MD. If pt is not established with a provider, would they like to be referred to a provider to establish care? No .   Dental Screening: Recommended annual dental exams for proper oral hygiene  Community Resource Referral / Chronic Care Management: CRR required this visit?  No   CCM required this visit?  No      Plan:     I have personally reviewed and noted the following in the patient's chart:   Medical and social history Use of alcohol, tobacco or illicit drugs  Current medications and supplements including opioid prescriptions.  Functional ability and status Nutritional status Physical activity Advanced directives List of other physicians Hospitalizations, surgeries, and ER visits in previous 12 months Vitals Screenings to include cognitive, depression, and falls Referrals and appointments  In addition, I have reviewed and discussed with patient certain preventive protocols, quality metrics, and best practice recommendations. A written personalized care plan for preventive services as well as general preventive health recommendations were provided to patient.     Sheral Flow, LPN   [id number]   Nurse Notes:  Patient is cogitatively intact. There were no vitals filed for this visit. There is no height or weight on file to  calculate BMI. Patient stated that she has no issues with gait or balance; does not use any assistive devices. Medications reviewed with patient;  no opioid use noted.  Medical screening examination/treatment/procedure(s) were performed by non-physician practitioner and as supervising physician I was immediately available for consultation/collaboration.  I agree with above. Lew Dawes, MD

## 2021-02-28 NOTE — Patient Instructions (Addendum)
[name] , Thank you for taking time to come for your Medicare Wellness Visit. I appreciate your ongoing commitment to your health goals. Please review the following plan we discussed and let me know if I can assist you in the future.   Screening recommendations/referrals: Colonoscopy: Not a candidate for screening due to age 86: 11/02/2020 Bone Density: never done Recommended yearly ophthalmology/optometry visit for glaucoma screening and checkup Recommended yearly dental visit for hygiene and checkup  Vaccinations: Influenza vaccine: 01/29/2021 Pneumococcal vaccine: declined Tdap vaccine: 10/01/2020; due every 10 years Shingles vaccine: never done   Covid-19: 06/13/2019, 07/11/2019, 08/03/2020, 01/10/2021  Advanced directives: Yes; patient has a Living Will (need copy)  Conditions/risks identified: Yes; Client understands the importance of follow-up with providers by attending scheduled visits and discussed goals to eat healthier, increase physical activity, exercise the brain, socialize more, get enough sleep and make time for laughter.  Next appointment: Please schedule your next Medicare Wellness Visit with your Nurse Health Advisor in 1 year by calling [phone].   Preventive Care 53 Years and Older, Female Preventive care refers to lifestyle choices and visits with your health care provider that can promote health and wellness. What does preventive care include? A yearly physical exam. This is also called an annual well check. Dental exams once or twice a year. Routine eye exams. Ask your health care provider how often you should have your eyes checked. Personal lifestyle choices, including: Daily care of your teeth and gums. Regular physical activity. Eating a healthy diet. Avoiding tobacco and drug use. Limiting alcohol use. Practicing safe sex. Taking low-dose aspirin every day. Taking vitamin and mineral supplements as recommended by your health care  provider. What happens during an annual well check? The services and screenings done by your health care provider during your annual well check will depend on your age, overall health, lifestyle risk factors, and family history of disease. Counseling  Your health care provider may ask you questions about your: Alcohol use. Tobacco use. Drug use. Emotional well-being. Home and relationship well-being. Sexual activity. Eating habits. History of falls. Memory and ability to understand (cognition). Work and work Statistician. Reproductive health. Screening  You may have the following tests or measurements: Height, weight, and BMI. Blood pressure. Lipid and cholesterol levels. These may be checked every 5 years, or more frequently if you are over 66 years old. Skin check. Lung cancer screening. You may have this screening every year starting at age 62 if you have a 30-pack-year history of smoking and currently smoke or have quit within the past 15 years. Fecal occult blood test (FOBT) of the stool. You may have this test every year starting at age 26. Flexible sigmoidoscopy or colonoscopy. You may have a sigmoidoscopy every 5 years or a colonoscopy every 10 years starting at age 74. Hepatitis C blood test. Hepatitis B blood test. Sexually transmitted disease (STD) testing. Diabetes screening. This is done by checking your blood sugar (glucose) after you have not eaten for a while (fasting). You may have this done every 1-3 years. Bone density scan. This is done to screen for osteoporosis. You may have this done starting at age 45. Mammogram. This may be done every 1-2 years. Talk to your health care provider about how often you should have regular mammograms. Talk with your health care provider about your test results, treatment options, and if necessary, the need for more tests. Vaccines  Your health care provider may recommend certain vaccines, such as: Influenza vaccine. This  is  recommended every year. Tetanus, diphtheria, and acellular pertussis (Tdap, Td) vaccine. You may need a Td booster every 10 years. Zoster vaccine. You may need this after age 32. Pneumococcal 13-valent conjugate (PCV13) vaccine. One dose is recommended after age 68. Pneumococcal polysaccharide (PPSV23) vaccine. One dose is recommended after age 35. Talk to your health care provider about which screenings and vaccines you need and how often you need them. This information is not intended to replace advice given to you by your health care provider. Make sure you discuss any questions you have with your health care provider. Document Released: 04/27/2015 Document Revised: 12/19/2015 Document Reviewed: 01/30/2015 Elsevier Interactive Patient Education  2017 [employer] Prevention in the Home Falls can cause injuries. They can happen to people of all ages. There are many things you can do to make your home safe and to help prevent falls. What can I do on the outside of my home? Regularly fix the edges of walkways and driveways and fix any cracks. Remove anything that might make you trip as you walk through a door, such as a raised step or threshold. Trim any bushes or trees on the path to your home. Use bright outdoor lighting. Clear any walking paths of anything that might make someone trip, such as rocks or tools. Regularly check to see if handrails are loose or broken. Make sure that both sides of any steps have handrails. Any raised decks and porches should have guardrails on the edges. Have any leaves, snow, or ice cleared regularly. Use sand or salt on walking paths during winter. Clean up any spills in your garage right away. This includes oil or grease spills. What can I do in the bathroom? Use night lights. Install grab bars by the toilet and in the tub and shower. Do not use towel bars as grab bars. Use non-skid mats or decals in the tub or shower. If you need to sit down in  the shower, use a plastic, non-slip stool. Keep the floor dry. Clean up any water that spills on the floor as soon as it happens. Remove soap buildup in the tub or shower regularly. Attach bath mats securely with double-sided non-slip rug tape. Do not have throw rugs and other things on the floor that can make you trip. What can I do in the bedroom? Use night lights. Make sure that you have a light by your bed that is easy to reach. Do not use any sheets or blankets that are too big for your bed. They should not hang down onto the floor. Have a firm chair that has side arms. You can use this for support while you get dressed. Do not have throw rugs and other things on the floor that can make you trip. What can I do in the kitchen? Clean up any spills right away. Avoid walking on wet floors. Keep items that you use a lot in easy-to-reach places. If you need to reach something above you, use a strong step stool that has a grab bar. Keep electrical cords out of the way. Do not use floor polish or wax that makes floors slippery. If you must use wax, use non-skid floor wax. Do not have throw rugs and other things on the floor that can make you trip. What can I do with my stairs? Do not leave any items on the stairs. Make sure that there are handrails on both sides of the stairs and use them. Fix handrails that  are broken or loose. Make sure that handrails are as long as the stairways. Check any carpeting to make sure that it is firmly attached to the stairs. Fix any carpet that is loose or worn. Avoid having throw rugs at the top or bottom of the stairs. If you do have throw rugs, attach them to the floor with carpet tape. Make sure that you have a light switch at the top of the stairs and the bottom of the stairs. If you do not have them, ask someone to add them for you. What else can I do to help prevent falls? Wear shoes that: Do not have high heels. Have rubber bottoms. Are comfortable  and fit you well. Are closed at the toe. Do not wear sandals. If you use a stepladder: Make sure that it is fully opened. Do not climb a closed stepladder. Make sure that both sides of the stepladder are locked into place. Ask someone to hold it for you, if possible. Clearly mark and make sure that you can see: Any grab bars or handrails. First and last steps. Where the edge of each step is. Use tools that help you move around (mobility aids) if they are needed. These include: Canes. Walkers. Scooters. Crutches. Turn on the lights when you go into a dark area. Replace any light bulbs as soon as they burn out. Set up your furniture so you have a clear path. Avoid moving your furniture around. If any of your floors are uneven, fix them. If there are any pets around you, be aware of where they are. Review your medicines with your doctor. Some medicines can make you feel dizzy. This can increase your chance of falling. Ask your doctor what other things that you can do to help prevent falls. This information is not intended to replace advice given to you by your health care provider. Make sure you discuss any questions you have with your health care provider. Document Released: 01/25/2009 Document Revised: 09/06/2015 Document Reviewed: 05/05/2014 Elsevier Interactive Patient Education  2017 [employer].

## 2021-03-01 ENCOUNTER — Other Ambulatory Visit: Payer: Self-pay

## 2021-03-01 ENCOUNTER — Ambulatory Visit (INDEPENDENT_AMBULATORY_CARE_PROVIDER_SITE_OTHER): Payer: Medicare HMO

## 2021-03-01 DIAGNOSIS — E785 Hyperlipidemia, unspecified: Secondary | ICD-10-CM

## 2021-03-01 DIAGNOSIS — I10 Essential (primary) hypertension: Secondary | ICD-10-CM

## 2021-03-01 DIAGNOSIS — E119 Type 2 diabetes mellitus without complications: Secondary | ICD-10-CM

## 2021-03-01 DIAGNOSIS — I2510 Atherosclerotic heart disease of native coronary artery without angina pectoris: Secondary | ICD-10-CM

## 2021-03-01 NOTE — Progress Notes (Addendum)
Chronic Care Management Pharmacy Note  03/01/2021 Name:  [name] MRN:  [id number] DOB:  1934/05/29  Blood sugars running in the 130-140's  BP averaging 140/80's     Summary: -Patient reports that she has been watching diet closer since latest PCP appointment due to rise in her A1c - reports to cutting back on amount of sugar / staches she is consuming, does drink ginger ale once daily at times  -reports that blood sugars have been averaging 130-140 recently since making adjustments to diet -Monitoring BP - has been averaging 140/80's   Recommendations/Changes made from today's visit: -Recommending for patient to continue monitoring blood pressure and blood sugars, will reach out should BP/BG become uncontrolled prior to next visit  -Recommended for patient to switch to diet ginger ale to help keep BG under better control - discussed carb amounts in regular sodas  Subjective: [name] is an 86 y.o. year old female who is a primary patient of Plotnikov, Georgina Quint, MD.  The CCM team was consulted for assistance with disease management and care coordination needs.    Engaged with patient by telephone for follow up visit in response to provider referral for pharmacy case management and/or care coordination services.   Consent to Services:  The patient was given the following information about Chronic Care Management services today, agreed to services, and gave verbal consent: 1. CCM service includes personalized support from designated clinical staff supervised by the primary care provider, including individualized plan of care and coordination with other care providers 2. 24/7 contact phone numbers for assistance for urgent and routine care needs. 3. Service will only be billed when office clinical staff spend 20 minutes or more in a month to coordinate care. 4. Only one practitioner may furnish and bill the service in a calendar month. 5.The patient may stop CCM services at  any time (effective at the end of the month) by phone call to the office staff. 6. The patient will be responsible for cost sharing (co-pay) of up to 20% of the service fee (after annual deductible is met). Patient agreed to services and consent obtained.  Patient Care Team: Plotnikov, Georgina Quint, MD as PCP - General (Internal Medicine) Rennis Golden [name], MD as PCP - Cardiology (Cardiology) Sallye Lat, MD as Consulting Physician (Ophthalmology) Erlene Quan Vinnie Level, [facility] as Pharmacist (Pharmacist)  Recent office visits:  01/29/2021 - Dr. Posey Rea - no changes to medications - f/u in 3 months    Recent consult visits:  None since last visit    Hospital visits:  None in previous 6 months  Objective:  Lab Results  Component Value Date   CREATININE 1.08 02/05/2021   BUN 12 02/05/2021   GFR 47.22 (L) 02/05/2021   GFRNONAA >60 06/23/2020   GFRAA 54 (L) 10/30/2019   NA 138 02/05/2021   K 3.9 02/05/2021   CALCIUM 9.0 02/05/2021   CO2 28 02/05/2021   GLUCOSE 136 (H) 02/05/2021    Lab Results  Component Value Date/Time   HGBA1C 7.6 (H) 02/05/2021 09:24 AM   HGBA1C 7.1 (H) 03/28/2020 11:43 AM   GFR 47.22 (L) 02/05/2021 09:24 AM   GFR 41.28 (L) 10/29/2020 09:41 AM    Last diabetic Eye exam:  Lab Results  Component Value Date/Time   HMDIABEYEEXA No Retinopathy 04/17/2017 04:31 PM    Last diabetic Foot exam:  No results found for: HMDIABFOOTEX   Lab Results  Component Value Date   CHOL 168 10/29/2020   HDL  66.40 10/29/2020   LDLCALC 85 10/29/2020   LDLDIRECT 105.5 11/26/2010   TRIG 82.0 10/29/2020   CHOLHDL 3 10/29/2020    Hepatic Function Latest Ref Rng & Units 02/05/2021 10/29/2020 07/12/2020  Total Protein 6.0 - 8.3 g/dL 7.2 6.8 7.2  Albumin 3.5 - 5.2 g/dL 4.2 4.1 4.4  AST 0 - 37 U/L $Remo'15 16 15  'ZirWx$ ALT 0 - 35 U/L $Remo'9 10 10  'GpiQl$ Alk Phosphatase 39 - 117 U/L 69 83 59  Total Bilirubin 0.2 - 1.2 mg/dL 0.6 0.4 0.4  Bilirubin, Direct 0.0 - 0.2 mg/dL - - -    Lab Results   Component Value Date/Time   TSH 1.65 02/05/2021 09:24 AM   TSH 1.39 10/29/2020 09:41 AM    CBC Latest Ref Rng & Units 02/05/2021 10/29/2020 06/23/2020  WBC 4.0 - 10.5 K/uL 4.5 4.4 6.4  Hemoglobin 12.0 - 15.0 g/dL 12.3 11.9(L) 12.3  Hematocrit 36.0 - 46.0 % 37.6 35.4(L) 38.6  Platelets 150.0 - 400.0 K/uL 201.0 202.0 243    Lab Results  Component Value Date/Time   VD25OH 38.3 11/18/2018 11:32 AM   VD25OH 42 08/04/2013 10:28 AM    Clinical ASCVD: No  The ASCVD Risk score (Arnett DK, et al., 2019) failed to calculate for the following reasons:   The 2019 ASCVD risk score is only valid for ages 31 to 38    Depression screen PHQ 2/9 02/28/2021 07/12/2020 11/29/2019  Decreased Interest 0 0 0  Down, Depressed, Hopeless 0 0 0  PHQ - 2 Score 0 0 0  Altered sleeping - - -  Tired, decreased energy - - -  Change in appetite - - -  Feeling bad or failure about yourself  - - -  Trouble concentrating - - -  Moving slowly or fidgety/restless - - -  Suicidal thoughts - - -  PHQ-9 Score - - -  Difficult doing work/chores - - -  Some recent data might be hidden    Social History   Tobacco Use  Smoking Status Never  Smokeless Tobacco Never   BP Readings from Last 3 Encounters:  01/29/21 132/70  10/25/20 130/62  10/01/20 (!) 172/90   Pulse Readings from Last 3 Encounters:  01/29/21 64  10/25/20 (!) 55  10/01/20 65   Wt Readings from Last 3 Encounters:  01/29/21 191 lb (86.6 kg)  10/25/20 184 lb 12.8 oz (83.8 kg)  10/01/20 182 lb (82.6 kg)   BMI Readings from Last 3 Encounters:  01/29/21 30.83 kg/m  10/25/20 29.83 kg/m  10/01/20 29.38 kg/m    Assessment/Interventions: Review of patient past medical history, allergies, medications, health status, including review of consultants reports, laboratory and other test data, was performed as part of comprehensive evaluation and provision of chronic care management services.   SDOH:  (Social Determinants of Health) assessments and  interventions performed: Yes  SDOH Screenings   Alcohol Screen: Not on file  Depression (PHQ2-9): Low Risk    PHQ-2 Score: 0  Financial Resource Strain: Low Risk    Difficulty of Paying Living Expenses: Not very hard  Food Insecurity: Not on file  Housing: Not on file  Physical Activity: Not on file  Social Connections: Not on file  Stress: Not on file  Tobacco Use: Low Risk    Smoking Tobacco Use: Never   Smokeless Tobacco Use: Never   Passive Exposure: Not on file  Transportation Needs: Not on file    [place]  Allergies  Allergen Reactions  Clarithromycin     REACTION: unspecified   Fosamax [Alendronate Sodium]     achy   Guaifenesin Er     itching   Metformin     REACTION: pt states INTOL to Metformin "felt drained"   Prandin [Repaglinide]     Prandin dropped CBGs to 66 once - she stopped it.   Tape     Adhesive tape---rash    Medications Reviewed Today     Reviewed by Tomasa Blase, [facility] (Pharmacist) on 03/01/21 at [phone]  Med List Status: <None>   Medication Order Taking? Sig Documenting Provider Last Dose Status Informant  ACCU-CHEK AVIVA PLUS test strip [id number] Yes USE TO CHECK BLOOD SUGARS DAILY Plotnikov, Evie Lacks, MD  Active   Accu-Chek Softclix Lancets lancets [id number] Yes USE  AS  INSTRUCTED Plotnikov, Evie Lacks, MD  Active   acetaminophen (TYLENOL) 500 MG tablet [id number] Yes Take 500 mg by mouth every 6 (six) hours as needed. [provider]  Active   Alcohol Swabs (B-D SINGLE USE SWABS REGULAR) PADS [id number] Yes USE TO CLEAN INJECTION SITE TO CHECK BLOOD SUGARS DAILY Plotnikov, Evie Lacks, MD  Active   ALPRAZolam (XANAX) 0.5 MG tablet [id number] Yes TAKE 1/2 TO 1 TABLET BY MOUTH THREE TIMES DAILY AS NEEDED FOR ANXIETY. DO NOT TAKE WITH TRAMADOL.THIS IS A 30 DAY SUPPLY Plotnikov, Evie Lacks, MD  Active   amLODipine (NORVASC) 5 MG tablet [id number] Yes TAKE 1 TABLET(5 MG) BY MOUTH DAILY Plotnikov, Evie Lacks, MD  Active   aspirin 81 MG  tablet [id number] Yes Take 81 mg by mouth daily. [provider]  Active Multiple Informants  Chlorphen-Pseudoephed-APAP (CORICIDIN D PO) [id number] Yes Take 1 tablet by mouth every 6 (six) hours as needed. [provider]  Active   Cholecalciferol (VITAMIN D3) 50 MCG (2000 UT) capsule [id number] Yes TAKE 1 CAPSULE BY MOUTH ONCE DAILY Plotnikov, Evie Lacks, MD  Active   diclofenac Sodium (VOLTAREN) 1 % GEL [id number] No Apply 2 g topically 4 (four) times daily.  Patient not taking: Reported on 03/01/2021   Aundra Dubin, PA-C Not Taking Active   furosemide (LASIX) 20 MG tablet [id number] Yes TAKE 1 TABLET(20 MG) BY MOUTH DAILY AS NEEDED FOR SWELLING  Patient taking differently: Take 20 mg by mouth daily as needed for edema (swelling).   Pixie Casino, MD  Active   hydroxypropyl methylcellulose (ISOPTO TEARS) 2.5 % ophthalmic solution [id number] Yes Place 1 drop into both eyes 2 (two) times daily. [provider]  Active Multiple Informants  isosorbide mononitrate (IMDUR) 30 MG 24 hr tablet [id number] Yes TAKE 1 TABLET BY MOUTH EVERY DAY Hilty, Nadean Corwin, MD  Active   metoprolol succinate (TOPROL-XL) 50 MG 24 hr tablet [id number] Yes Takes 1&1/2 tablets twice a day Pixie Casino, MD  Active   pantoprazole (PROTONIX) 40 MG tablet [id number] Yes TAKE 1 TABLET BY MOUTH EVERY DAY  Patient taking differently: Take 40 mg by mouth daily as needed.   Pixie Casino, MD  Active   simvastatin (ZOCOR) 20 MG tablet [id number] Yes TAKE 1 TABLET BY MOUTH AT BEDTIME Plotnikov, Evie Lacks, MD  Active   traMADol (ULTRAM) 50 MG tablet [id number] Yes Take 1 tablet (50 mg total) by mouth 3 (three) times daily as needed. Plotnikov, Evie Lacks, MD  Active   warfarin (COUMADIN) 5 MG tablet [id number] Yes TAKE 1 TABLET BY MOUTH EVERY DAY AS DIRECTED BY COUMADIN CLINIC Hilty, Nadean Corwin, MD  Active  Patient Active Problem List   Diagnosis Date Noted   Atherosclerosis of aorta ([place])  10/25/2020   Arthralgia 10/25/2020   Hip pain, acute, left 10/01/2020   Fall 10/01/2020   Wrist contusion 10/01/2020   Hypokalemia 07/12/2020   Gastroenteritis 04/27/2020   Dehydration 04/27/2020   Urinary frequency 02/28/2020   Peroneal tendinitis 11/01/2019   Spontaneous rupture of extensor tendon of left foot 10/28/2019   Contusion of left foot 10/27/2019   Sinusitis 04/13/2019   Rash 12/05/2016   Dyspnea 04/24/2016   Tricuspid valve insufficiency 04/24/2016   Cerumen impaction 11/19/2015   Ear discomfort 11/13/2015   Grief at loss of child 09/14/2015   Well adult exam 09/14/2015   Dyslipidemia    PSVT (paroxysmal supraventricular tachycardia) (HCC)    PAF (paroxysmal atrial fibrillation) ([place])    Viral gastroenteritis 07/21/2015   Acute upper respiratory infection 04/17/2015   Edema 11/08/2014   Chronic venous insufficiency 11/08/2014   Diarrhea 05/01/2014   Mass of left thigh 12/14/2013   Microhematuria 12/14/2013   Long term (current) use of anticoagulants 08/19/2013   CAD (coronary artery disease) 05/21/2011   Atrial tachycardia ([place]) 11/19/2010   CHEST PAIN 08/07/2009   Vitamin D deficiency 04/19/2008   SUPERFICIAL THROMBOPHLEBITIS 10/20/2007   DM2 (diabetes mellitus, type 2) ([place]) 03/16/2007   HYPERCHOLESTEROLEMIA 03/15/2007   Anxiety 03/15/2007   Essential hypertension 03/15/2007   Mitral valve disorder 03/15/2007   BRONCHITIS 03/15/2007   HIATAL HERNIA 03/15/2007   Irritable bowel syndrome 03/15/2007   Osteoarthritis 03/15/2007   Osteoporosis 03/15/2007    Immunization History  Administered Date(s) Administered   Fluad Quad(high Dose 65+) 02/15/2019, 02/07/2020, 01/29/2021   Influenza Split 01/21/2011, 02/24/2012   Influenza Whole 02/02/2009, 05/21/2010   Influenza, High Dose Seasonal PF 03/18/2016, 03/31/2017, 02/10/2018   Influenza,inj,Quad PF,6+ Mos 01/11/2013, 05/01/2014, 02/08/2015   Moderna SARS-COV2 Booster Vaccination 08/03/2020   Moderna  Sars-Covid-2 Vaccination 06/13/2019, 07/11/2019   Pfizer Covid-19 Vaccine Bivalent Booster 77yrs & up 01/10/2021   Tdap 10/01/2020    Conditions to be addressed/monitored:  Hypertension, Hyperlipidemia, Diabetes, Atrial Fibrillation, Chronic Kidney Disease, Anxiety, GERD, and Pain from fall  Care Plan : CCM Care Plan  Updates made by Tomasa Blase, RPH since 03/01/2021 12:00 AM     Problem: Hypertension, Hyperlipidemia, Diabetes, Atrial Fibrillation, Chronic Kidney Disease, Anxiety, GERD, and Pain from fall   Priority: High  Onset Date: 01/04/2021     Long-Range Goal: Disease Management   Start Date: 01/04/2021  Expected End Date: 07/04/2021  Recent Progress: On track  Priority: High  Note:   Current Barriers:  Unable to independently monitor therapeutic efficacy  Pharmacist Clinical Goal(s):  Patient will achieve adherence to monitoring guidelines and medication adherence to achieve therapeutic efficacy maintain control of LDL, BP, and BG as evidenced by next lipid panel /BP and BG logs   through collaboration with PharmD and provider.   Interventions: 1:1 collaboration with Plotnikov, Evie Lacks, MD regarding development and update of comprehensive plan of care as evidenced by provider attestation and co-signature Inter-disciplinary care team collaboration (see longitudinal plan of care) Comprehensive medication review performed; medication list updated in electronic medical record  Hypertension (BP goal <140/90) -Controlled with most recent office visits -Current treatment: Metoprolol Succinate 50mg  - 1.5 tablets twice daily  Amlodipine 5mg  - 1 tablet daily Isosorbide Mononitrate 30mg  - 1 tablet daily  Furosemide 20mg  - 1 tablet daily as needed - taking 2-3 times a week   -Medications previously tried: n/a  -Current home readings:  averaging ~140/80 at home  BP Readings from Last 3 Encounters:  01/29/21 132/70  10/25/20 130/62  10/01/20 (!) 172/90  -Current dietary  habits: reports that she tries not to add salt to her foods, tries to eat a sodium reduced diet - does get meals on wheels where she cannot control the sodium content  -Current exercise habits: has not been very active since her fall in June, is slowly improving and increasing her activity- follows with PT once weekly   -Denies hypotensive/hypertensive symptoms -Educated on BP goals and benefits of medications for prevention of heart attack, stroke and kidney damage; Daily salt intake goal < 2300 mg; Exercise goal of 150 minutes per week; Importance of home blood pressure monitoring; Proper BP monitoring technique; Symptoms of hypotension and importance of maintaining adequate hydration; -Counseled to monitor BP at home daily, document, and provide log at future appointments -Counseled on diet and exercise extensively Recommended to continue current medication  Hyperlipidemia/ Coronary Artery Disease : (LDL goal < 100) -Controlled Lab Results  Component Value Date   LDLCALC 85 10/29/2020  -Current treatment: Simvastatin 20mg  - 1 tablet daily  Aspirin 81mg  - 1 tablet daily  -Medications previously tried: n/a  -Current dietary patterns: notes that she does eat red meat on occasion, gets some of her meals through meals on wheels so does not always have a choice as to what she is eating  -Current exercise habits: has not been very active since her fall in June, is slowly improving and increasing her activity- follows with PT once weekly   -Educated on Cholesterol goals;  Benefits of statin for ASCVD risk reduction; Importance of limiting foods high in cholesterol; Exercise goal of 150 minutes per week; -Counseled on diet and exercise extensively Recommended to continue current medication  Diabetes (A1c goal <7%) -Not ideally controlled last A1c has increased  Lab Results  Component Value Date   HGBA1C 7.6 (H) 02/05/2021  -Current medications: N/a -Medications previously tried:  metformin, repaglinide  -Current home glucose readings fasting glucose: 130-140 -Denies hypoglycemic/hyperglycemic symptoms -Current meal patterns:  breakfast: eggs with a piece of bacon / beef Inda Merlin with grits  with fruit - 1 cup of decaf coffee  lunch: meals on wheels, at times can be a sandwich with a side dinner: protein, potatoes, greens, salad snacks: fruit, chips, peanuts drinks: ginger ale on occasion / water  -Current exercise: has not been very active since her fall in June, is slowly improving and increasing her activity- follows with PT once weekly   -Educated on A1c and blood sugar goals; Complications of diabetes including kidney damage, retinal damage, and cardiovascular disease; Exercise goal of 150 minutes per week; Benefits of routine self-monitoring of blood sugar; Carbohydrate counting and/or plate method -Counseled to check feet daily and get yearly eye exams -Counseled on diet and exercise extensively -Patient to continue to monitor blood sugars, will add medication in future if necessary - cautious due to previous issues with hypoglycemia on repaglinide, believe that hypoglycemia risk could be greater with SU - continue with dietary changes at this time   Atrial Fibrillation (Goal: prevent stroke and major bleeding) -Controlled -CHADSVASC: 4 -Current treatment: Rate control: Metoprolol Succinate 50mg  - 1.5 tablets twice daily  Anticoagulation: Warfarin 5mg  - taken as directed  -Medications previously tried: n/a -Home BP and HR readings: unknown at this time, could only recall that SBP is usually averaging 130-140  -Counseled on increased risk of stroke due to Afib and benefits of anticoagulation for stroke  prevention; importance of adherence to anticoagulant exactly as prescribed; bleeding risk associated with warfarin and importance of self-monitoring for signs/symptoms of bleeding; avoidance of NSAIDs due to increased bleeding risk with  anticoagulants; importance of regular laboratory monitoring; seeking medical attention after a head injury or if there is blood in the urine/stool; -Counseled on diet and exercise extensively Recommended to continue current medication  Anxiety (Goal: Prevention/control of anxiety attacks) -Controlled -Current treatment: Alprazolam 0.5mg  - 1/2-1 tablet twice daily as needed  -Medications previously tried/failed: diazepam  -GAD7: 0 -Educated on Benefits of medication for symptom control Benefits of cognitive-behavioral therapy with or without medication -Recommended to continue current medication  GERD (Goal: Prevention/control of acid reflux) -Controlled -Current treatment  Pantoprazole 40mg  - 1 tablet daily as needed -Medications previously tried: maalox  -Counseled on diet and exercise extensively Recommended to continue current medication  CKD (Goal: Prevention of disease progression ) -Stable -Last eGFR 47.22 mL/min -Current treatment  Counseled patient on avoidance of nephrotoxic agents such as NSAIDs, also advised on adequate BP and BG control to prevent kidney damage  -Recommended to continue current medication  Pain from fall (Goal: Pain control) -Controlled -Current treatment  Tramadol 50mg  - 1-2 tablet daily as needed  -Medications previously tried: APAP, voltaren gel, voltaren, nabumetone, norco  -Recommended to continue current medication   Health Maintenance -Vaccine gaps: Shingles, COVID booster, influenza vaccine -Current therapy:  Isosopt Tears 2.5% ophthalmic solution - 1 drop into both eyes daily as needed  Vitamin D3 2000 units - 1 tablet daily  Coricidin - 1 tablet every 6 hours as needed for cough/ cold  APAP 500mg  - 1 tablet every 6 hours as needed for pain -Educated on Cost vs benefit of each product must be carefully weighed by individual consumer -Patient is satisfied with current therapy and denies issues -Recommended to continue current  medication  Patient Goals/Self-Care Activities Patient will:  - take medications as prescribed check glucose daily, document, and provide at future appointments check blood pressure daily, document, and provide at future appointments engage in dietary modifications by reducing sodium, / high cholesterol foods / moderating of carbohydrate intake  Follow Up Plan: Telephone follow up appointment with care management team member scheduled for: 2 months The patient has been provided with contact information for the care management team and has been advised to call with any health related questions or concerns.         Medication Assistance: None required.  Patient affirms current coverage meets needs.  Patient's preferred pharmacy is:  [facility] [phone] - [place], [place] - [phone] [place] [address] Phone: [phone] Fax: [phone]  [place], [place] [address] Phone: [phone] Fax: [phone]   Uses pill box? Yes Pt endorses 100% compliance  Care Plan and Follow Up Patient Decision:  Patient agrees to Care Plan and Follow-up.  Plan: Telephone follow up appointment with care management team member scheduled for:  3 months months and The patient has been provided with contact information for the care management team and has been advised to call with any health related questions or concerns.   Tomasa Blase, PharmD Clinical Pharmacist, [place] screening examination/treatment/procedure(s) were performed by non-physician practitioner and as supervising physician I was immediately available for consultation/collaboration.  I agree with above. Lew Dawes, MD

## 2021-03-01 NOTE — Patient Instructions (Addendum)
Visit Information  Thank you for taking time to visit with me today. Please don't hesitate to contact me if I can be of assistance to you before our next scheduled telephone appointment.  Telephone follow up appointment with care management team member scheduled for: The patient has been provided with contact information for the care management team and has been advised to call with any health related questions or concerns.   If you need to cancel or re-schedule our visit, please call [phone] and our care guide team will be happy to assist you.  Following is a list of the goals we discussed today:   Track and Manage My Blood Pressure-  Hypertension  Timeframe:  Long-Range Goal Priority:  High Start Date:   01/04/2021                          Expected End Date:  03/06/2022                     Follow Up Date March 2023   - check blood pressure daily - choose a place to take my blood pressure (home, clinic or office, retail store) - write blood pressure results in a log or diary    Why is this important?   You won't feel high blood pressure, but it can still hurt your blood vessels.  High blood pressure can cause heart or kidney problems. It can also cause a stroke.  Making lifestyle changes like losing a little weight or eating less salt will help.  Checking your blood pressure at home and at different times of the day can help to control blood pressure.  If the doctor prescribes medicine remember to take it the way the doctor ordered.  Call the office if you cannot afford the medicine or if there are questions about it.   Track and Manage My Blood Sugars - DM2  Timeframe:  Long-Range Goal Priority:  High Start Date:   01/04/2021                          Expected End Date:  03/06/2022                     Follow Up Date March 2023   - check blood sugar at prescribed times - check blood sugar if I feel it is too high or too low - enter blood sugar readings and medication or  insulin into daily log - take the blood sugar log to all doctor visits - take the blood sugar meter to all doctor visits    Why is this important?   Checking your blood sugar at home helps to keep it from getting very high or very low.  Writing the results in a diary or log helps the doctor know how to care for you.  Your blood sugar log should have the time, date and the results.  Also, write down the amount of insulin or other medicine that you take.  Other information, like what you ate, exercise done and how you were feeling, will also be helpful.    The patient verbalized understanding of instructions, educational materials, and care plan provided today and declined offer to receive copy of patient instructions, educational materials, and care plan.   Tomasa Blase, PharmD Clinical Pharmacist, [place]

## 2021-03-11 ENCOUNTER — Other Ambulatory Visit: Payer: Self-pay | Admitting: Internal Medicine

## 2021-03-11 DIAGNOSIS — I480 Paroxysmal atrial fibrillation: Secondary | ICD-10-CM

## 2021-03-13 ENCOUNTER — Ambulatory Visit (INDEPENDENT_AMBULATORY_CARE_PROVIDER_SITE_OTHER): Payer: Medicare HMO

## 2021-03-13 ENCOUNTER — Other Ambulatory Visit: Payer: Self-pay

## 2021-03-13 DIAGNOSIS — E785 Hyperlipidemia, unspecified: Secondary | ICD-10-CM | POA: Diagnosis not present

## 2021-03-13 DIAGNOSIS — E119 Type 2 diabetes mellitus without complications: Secondary | ICD-10-CM

## 2021-03-13 DIAGNOSIS — Z7901 Long term (current) use of anticoagulants: Secondary | ICD-10-CM

## 2021-03-13 DIAGNOSIS — I480 Paroxysmal atrial fibrillation: Secondary | ICD-10-CM

## 2021-03-13 DIAGNOSIS — I2510 Atherosclerotic heart disease of native coronary artery without angina pectoris: Secondary | ICD-10-CM

## 2021-03-13 DIAGNOSIS — Z5181 Encounter for therapeutic drug level monitoring: Secondary | ICD-10-CM | POA: Diagnosis not present

## 2021-03-13 DIAGNOSIS — I2583 Coronary atherosclerosis due to lipid rich plaque: Secondary | ICD-10-CM

## 2021-03-13 DIAGNOSIS — I10 Essential (primary) hypertension: Secondary | ICD-10-CM | POA: Diagnosis not present

## 2021-03-13 LAB — POCT INR: INR: 2.2 (ref 2.0–3.0)

## 2021-03-13 NOTE — Patient Instructions (Signed)
Description   Continue taking 1 tablet daily except 1/2 tablet each Sunday.  Repeat INR in 7 weeks.  *Consistent amount of greens every week*

## 2021-03-18 ENCOUNTER — Other Ambulatory Visit: Payer: Self-pay | Admitting: Internal Medicine

## 2021-03-28 ENCOUNTER — Other Ambulatory Visit: Payer: Self-pay

## 2021-03-28 ENCOUNTER — Ambulatory Visit
Admission: RE | Admit: 2021-03-28 | Discharge: 2021-03-28 | Disposition: A | Discharge status: Home / Self Care | Payer: Medicare HMO | Source: Ambulatory Visit | Attending: Urology | Admitting: Urology

## 2021-03-28 ENCOUNTER — Other Ambulatory Visit: Payer: Medicare HMO

## 2021-03-28 DIAGNOSIS — N281 Cyst of kidney, acquired: Secondary | ICD-10-CM | POA: Diagnosis not present

## 2021-03-28 DIAGNOSIS — D4102 Neoplasm of uncertain behavior of left kidney: Secondary | ICD-10-CM

## 2021-03-28 DIAGNOSIS — D3500 Benign neoplasm of unspecified adrenal gland: Secondary | ICD-10-CM | POA: Diagnosis not present

## 2021-03-28 DIAGNOSIS — N2889 Other specified disorders of kidney and ureter: Secondary | ICD-10-CM | POA: Diagnosis not present

## 2021-03-28 DIAGNOSIS — K8689 Other specified diseases of pancreas: Secondary | ICD-10-CM | POA: Diagnosis not present

## 2021-03-28 MED ORDER — GADOBENATE DIMEGLUMINE 529 MG/ML IV SOLN
18.0000 mL | Freq: Once | INTRAVENOUS | Status: AC | PRN
  Administered 2021-03-28: 18 mL via INTRAVENOUS

## 2021-04-22 ENCOUNTER — Telehealth: Payer: Self-pay | Admitting: Internal Medicine

## 2021-04-22 NOTE — Telephone Encounter (Signed)
No.Thx.

## 2021-04-22 NOTE — Telephone Encounter (Signed)
Patient inquiring if she needs to fast for her upcoming 3 mo f/u ov scheduled for 05-08-2021  Please advise

## 2021-04-23 NOTE — Telephone Encounter (Signed)
Patient notified that she does not need to fast

## 2021-05-01 ENCOUNTER — Other Ambulatory Visit: Payer: Self-pay

## 2021-05-01 ENCOUNTER — Ambulatory Visit (INDEPENDENT_AMBULATORY_CARE_PROVIDER_SITE_OTHER): Payer: Medicare HMO

## 2021-05-01 DIAGNOSIS — I480 Paroxysmal atrial fibrillation: Secondary | ICD-10-CM | POA: Diagnosis not present

## 2021-05-01 DIAGNOSIS — Z7901 Long term (current) use of anticoagulants: Secondary | ICD-10-CM | POA: Diagnosis not present

## 2021-05-01 LAB — POCT INR: INR: 2.2 (ref 2.0–3.0)

## 2021-05-01 NOTE — Patient Instructions (Signed)
Continue taking 1 tablet daily except 1/2 tablet each Sunday.  Repeat INR in 8 weeks.  *Consistent amount of greens every week*

## 2021-05-08 ENCOUNTER — Other Ambulatory Visit: Payer: Self-pay

## 2021-05-08 ENCOUNTER — Ambulatory Visit (INDEPENDENT_AMBULATORY_CARE_PROVIDER_SITE_OTHER): Payer: Medicare HMO | Admitting: Internal Medicine

## 2021-05-08 ENCOUNTER — Encounter: Payer: Self-pay | Admitting: Internal Medicine

## 2021-05-08 DIAGNOSIS — W19XXXS Unspecified fall, sequela: Secondary | ICD-10-CM | POA: Diagnosis not present

## 2021-05-08 DIAGNOSIS — E119 Type 2 diabetes mellitus without complications: Secondary | ICD-10-CM

## 2021-05-08 DIAGNOSIS — F419 Anxiety disorder, unspecified: Secondary | ICD-10-CM

## 2021-05-08 DIAGNOSIS — I2583 Coronary atherosclerosis due to lipid rich plaque: Secondary | ICD-10-CM | POA: Diagnosis not present

## 2021-05-08 DIAGNOSIS — I2510 Atherosclerotic heart disease of native coronary artery without angina pectoris: Secondary | ICD-10-CM

## 2021-05-08 DIAGNOSIS — K580 Irritable bowel syndrome with diarrhea: Secondary | ICD-10-CM | POA: Diagnosis not present

## 2021-05-08 DIAGNOSIS — R609 Edema, unspecified: Secondary | ICD-10-CM | POA: Diagnosis not present

## 2021-05-08 LAB — COMPREHENSIVE METABOLIC PANEL
ALT: 13 U/L (ref 0–35)
AST: 19 U/L (ref 0–37)
Albumin: 4.3 g/dL (ref 3.5–5.2)
Alkaline Phosphatase: 60 U/L (ref 39–117)
BUN: 16 mg/dL (ref 6–23)
CO2: 30 mEq/L (ref 19–32)
Calcium: 9.3 mg/dL (ref 8.4–10.5)
Chloride: 102 mEq/L (ref 96–112)
Creatinine, Ser: 1.14 mg/dL (ref 0.40–1.20)
GFR: 44.18 mL/min — ABNORMAL LOW (ref >60.00)
Glucose, Bld: 167 mg/dL — ABNORMAL HIGH (ref 70–99)
Potassium: 3.8 mEq/L (ref 3.5–5.1)
Sodium: 138 mEq/L (ref 135–145)
Total Bilirubin: 0.5 mg/dL (ref 0.2–1.2)
Total Protein: 7.3 g/dL (ref 6.0–8.3)

## 2021-05-08 LAB — HEMOGLOBIN A1C: Hgb A1c MFr Bld: 7.8 % — ABNORMAL HIGH (ref 4.6–6.5)

## 2021-05-08 MED ORDER — HYOSCYAMINE SULFATE 0.125 MG PO TABS: 0.1250 mg | ORAL_TABLET | Freq: Four times a day (QID) | ORAL | 3 refills | Status: DC | PRN

## 2021-05-08 NOTE — Assessment & Plan Note (Signed)
Cont on Xanax prn  Potential benefits of a long term benzodiazepines  use as well as potential risks  and complications were explained to the patient and were aknowledged. Not to take w/Tramadol 

## 2021-05-08 NOTE — Progress Notes (Signed)
Subjective:  Patient ID: [name], female    DOB: 1934/11/02  Age: 87 y.o. MRN: [id number]  CC: Follow-up (3 month f/u)   HPI [name] presents for DM, HTN, CAD, dyslipidemia, IBS  Outpatient Medications Prior to Visit  Medication Sig Dispense Refill   ACCU-CHEK AVIVA PLUS test strip USE TO CHECK BLOOD SUGARS DAILY 100 strip 2   Accu-Chek Softclix Lancets lancets USE  AS  INSTRUCTED 100 each 2   acetaminophen (TYLENOL) 500 MG tablet Take 500 mg by mouth every 6 (six) hours as needed.     Alcohol Swabs (B-D SINGLE USE SWABS REGULAR) PADS USE TO CLEAN INJECTION SITE TO CHECK BLOOD SUGARS DAILY 100 each 2   ALPRAZolam (XANAX) 0.5 MG tablet TAKE 1/2 TO 1 TABLET BY MOUTH THREE TIMES DAILY AS NEEDED FOR ANXIETY. DO NOT TAKE WITH TRAMADOL.THIS IS A 30 DAY SUPPLY 90 tablet 3   amLODipine (NORVASC) 5 MG tablet TAKE 1 TABLET(5 MG) BY MOUTH DAILY 90 tablet 3   aspirin 81 MG tablet Take 81 mg by mouth daily.     Cholecalciferol (VITAMIN D3) 50 MCG (2000 UT) capsule TAKE 1 CAPSULE BY MOUTH ONCE DAILY 100 capsule 3   diclofenac Sodium (VOLTAREN) 1 % GEL Apply 2 g topically 4 (four) times daily. 150 g 1   furosemide (LASIX) 20 MG tablet TAKE 1 TABLET(20 MG) BY MOUTH DAILY AS NEEDED FOR SWELLING 30 tablet 3   hydroxypropyl methylcellulose (ISOPTO TEARS) 2.5 % ophthalmic solution Place 1 drop into both eyes 2 (two) times daily.     isosorbide mononitrate (IMDUR) 30 MG 24 hr tablet TAKE 1 TABLET BY MOUTH EVERY DAY 90 tablet 3   metoprolol succinate (TOPROL-XL) 50 MG 24 hr tablet Takes 1&1/2 tablets twice a day 90 tablet 6   pantoprazole (PROTONIX) 40 MG tablet TAKE 1 TABLET BY MOUTH EVERY DAY (Patient taking differently: Take 40 mg by mouth daily as needed.) 90 tablet 3   simvastatin (ZOCOR) 20 MG tablet TAKE 1 TABLET BY MOUTH AT BEDTIME 90 tablet 1   traMADol (ULTRAM) 50 MG tablet Take 1 tablet (50 mg total) by mouth 3 (three) times daily as needed. 90 tablet 2   warfarin (COUMADIN) 5 MG  tablet TAKE 1 TABLET BY MOUTH EVERY DAY AS DIRECTED BY COUMADIN CLINIC 90 tablet 0   Chlorphen-Pseudoephed-APAP (CORICIDIN D PO) Take 1 tablet by mouth every 6 (six) hours as needed.     No facility-administered medications prior to visit.    ROS: Review of Systems  Constitutional:  Positive for fatigue. Negative for activity change, appetite change, chills and unexpected weight change.  HENT:  Negative for congestion, mouth sores and sinus pressure.   Eyes:  Negative for visual disturbance.  Respiratory:  Negative for cough and chest tightness.   Cardiovascular:  Positive for leg swelling.  Gastrointestinal:  Negative for abdominal pain and nausea.  Genitourinary:  Negative for difficulty urinating, frequency and vaginal pain.  Musculoskeletal:  Positive for arthralgias and gait problem. Negative for back pain.  Skin:  Negative for pallor and rash.  Neurological:  Negative for dizziness, tremors, weakness, numbness and headaches.  Psychiatric/Behavioral:  Negative for confusion and sleep disturbance.    Objective:  BP 132/64 (BP Location: Left Arm)    Pulse 66    Temp 98.1 F (36.7 C) (Oral)    Ht 5\' 6"  (1.676 m)    Wt 189 lb (85.7 kg)    SpO2 96%    BMI 30.51  kg/m   BP Readings from Last 3 Encounters:  05/08/21 132/64  01/29/21 132/70  10/25/20 130/62    Wt Readings from Last 3 Encounters:  05/08/21 189 lb (85.7 kg)  01/29/21 191 lb (86.6 kg)  10/25/20 184 lb 12.8 oz (83.8 kg)    Physical Exam Constitutional:      General: She is not in acute distress.    Appearance: She is well-developed.  HENT:     Head: Normocephalic.     Right Ear: External ear normal.     Left Ear: External ear normal.     Nose: Nose normal.  Eyes:     General:        Right eye: No discharge.        Left eye: No discharge.     Conjunctiva/sclera: Conjunctivae normal.     Pupils: Pupils are equal, round, and reactive to light.  Neck:     Thyroid: No thyromegaly.     Vascular: No JVD.      Trachea: No tracheal deviation.  Cardiovascular:     Rate and Rhythm: Normal rate and regular rhythm.     Heart sounds: Normal heart sounds.  Pulmonary:     Effort: No respiratory distress.     Breath sounds: No stridor. No wheezing.  Abdominal:     General: Bowel sounds are normal. There is no distension.     Palpations: Abdomen is soft. There is no mass.     Tenderness: There is no abdominal tenderness. There is no guarding or rebound.  Musculoskeletal:        General: No tenderness.     Cervical back: Normal range of motion and neck supple. No rigidity.     Right lower leg: Edema present.     Left lower leg: Edema present.  Lymphadenopathy:     Cervical: No cervical adenopathy.  Skin:    Findings: No erythema or rash.  Neurological:     Mental Status: She is oriented to person, place, and time.     Cranial Nerves: No cranial nerve deficit.     Motor: No abnormal muscle tone.     Coordination: Coordination abnormal.     Gait: Gait abnormal.     Deep Tendon Reflexes: Reflexes normal.  Psychiatric:        Behavior: Behavior normal.        Thought Content: Thought content normal.        Judgment: Judgment normal.  LEs w/trace edema  Lab Results  Component Value Date   WBC 4.5 02/05/2021   HGB 12.3 02/05/2021   HCT 37.6 02/05/2021   PLT 201.0 02/05/2021   GLUCOSE 136 (H) 02/05/2021   CHOL 168 10/29/2020   TRIG 82.0 10/29/2020   HDL 66.40 10/29/2020   LDLDIRECT 105.5 11/26/2010   LDLCALC 85 10/29/2020   ALT 9 02/05/2021   AST 15 02/05/2021   NA 138 02/05/2021   K 3.9 02/05/2021   CL 104 02/05/2021   CREATININE 1.08 02/05/2021   BUN 12 02/05/2021   CO2 28 02/05/2021   TSH 1.65 02/05/2021   INR 2.2 05/01/2021   HGBA1C 7.6 (H) 02/05/2021    MR ABDOMEN WWO CONTRAST  Result Date: 03/30/2021 CLINICAL DATA:  Follow-up renal mass EXAM: MRI ABDOMEN WITHOUT AND WITH CONTRAST TECHNIQUE: Multiplanar multisequence MR imaging of the abdomen was performed both before and  after the administration of intravenous contrast. CONTRAST:  82mL MULTIHANCE GADOBENATE DIMEGLUMINE 529 MG/ML IV SOLN COMPARISON:  MRI abdomen 09/01/2019 FINDINGS:  Study is limited due to motion. Lower chest: Not well visualized. Hepatobiliary: Liver is normal in size and contour. No evidence of hepatic steatosis. No suspicious hepatic mass visualized. A few tiny hyperintense T2 signal likely cysts. Gallbladder contains layering hypointense T2 signal material likely sludge and stones. No gallbladder wall thickening or pericholecystic edema. No biliary ductal dilatation identified. Pancreas: Mildly atrophic with no suspicious mass or ductal dilatation. No acute inflammatory changes visualized. Spleen:  Within normal limits in size and appearance. Adrenals/Urinary Tract: Stable 2.6 cm right adrenal gland adenoma. Left adrenal gland appears normal. Stable size and appearance of a partially exophytic heterogeneously enhancing cystic and solid mass at the posterior left kidney measuring 1.1 cm. No additional suspicious renal mass identified bilaterally. Multiple renal cortical cysts are again seen bilaterally measuring up to 3.6 cm on the right. No hydronephrosis. Stomach/Bowel: Visualized portions within the abdomen are unremarkable. Vascular/Lymphatic: No pathologically enlarged lymph nodes identified. No abdominal aortic aneurysm demonstrated. Other:  No ascites. Musculoskeletal: No suspicious bone lesions identified. IMPRESSION: 1. Stable size and appearance of the heterogeneously enhancing mixed cystic and solid mass at the posterior left kidney measuring 1.1 cm. No new mass or lymphadenopathy identified. 2. Stable right adrenal gland adenoma. 3. Multiple renal cortical cysts. Electronically Signed   By: Ofilia Neas M.D.   On: 03/30/2021 15:23    Assessment & Plan:   Problem List Items Addressed This Visit     Anxiety    Cont on Xanax prn  Potential benefits of a long term benzodiazepines  use as well  as potential risks  and complications were explained to the patient and were aknowledged. Not to take w/Tramadol      CAD (coronary artery disease)    Cont on Simvastatin, Toprol, Isosorbide, Amlodipine      DM2 (diabetes mellitus, type 2) ([place])     Off Prandin. On diet      Relevant Orders   Comprehensive metabolic panel   Hemoglobin A1c   Edema    Relapsed Cont w/Furosemide prn      Fall    No relapse      Irritable bowel syndrome    Cont w/Hyoscyamine prn      Relevant Medications   hyoscyamine (LEVSIN) 0.125 MG tablet      Meds ordered this encounter  Medications   hyoscyamine (LEVSIN) 0.125 MG tablet    Sig: Take 1 tablet (0.125 mg total) by mouth every 6 (six) hours as needed for up to 10 days for cramping.    Dispense:  100 tablet    Refill:  3      Follow-up: Return for a follow-up visit.  Walker Kehr, MD

## 2021-05-08 NOTE — Assessment & Plan Note (Signed)
No relapse 

## 2021-05-08 NOTE — Assessment & Plan Note (Signed)
Relapsed Cont w/Furosemide prn

## 2021-05-08 NOTE — Assessment & Plan Note (Signed)
Cont on Simvastatin, Toprol, Isosorbide, Amlodipine

## 2021-05-08 NOTE — Assessment & Plan Note (Signed)
Off Prandin. On diet

## 2021-05-08 NOTE — Assessment & Plan Note (Signed)
Cont w/Hyoscyamine prn

## 2021-05-17 ENCOUNTER — Other Ambulatory Visit: Payer: Self-pay | Admitting: Internal Medicine

## 2021-06-09 ENCOUNTER — Other Ambulatory Visit: Payer: Self-pay | Admitting: Internal Medicine

## 2021-06-09 DIAGNOSIS — I480 Paroxysmal atrial fibrillation: Secondary | ICD-10-CM

## 2021-06-19 ENCOUNTER — Other Ambulatory Visit: Payer: Self-pay | Admitting: Internal Medicine

## 2021-06-21 ENCOUNTER — Ambulatory Visit (INDEPENDENT_AMBULATORY_CARE_PROVIDER_SITE_OTHER): Payer: Medicare HMO

## 2021-06-21 DIAGNOSIS — I2510 Atherosclerotic heart disease of native coronary artery without angina pectoris: Secondary | ICD-10-CM

## 2021-06-21 DIAGNOSIS — I700 Atherosclerosis of aorta: Secondary | ICD-10-CM

## 2021-06-21 DIAGNOSIS — I480 Paroxysmal atrial fibrillation: Secondary | ICD-10-CM

## 2021-06-21 DIAGNOSIS — E119 Type 2 diabetes mellitus without complications: Secondary | ICD-10-CM

## 2021-06-21 DIAGNOSIS — I10 Essential (primary) hypertension: Secondary | ICD-10-CM

## 2021-06-21 DIAGNOSIS — E785 Hyperlipidemia, unspecified: Secondary | ICD-10-CM

## 2021-06-21 NOTE — Patient Instructions (Signed)
Visit Information ? ?Following are the goals we discussed today:  ? ?Track and Manage My Blood sugars  ? ?Timeframe:  Long-Range Goal ?Priority:  High ?Start Date:   01/04/2021                          ?Expected End Date:  03/06/2022                    ? ?Follow Up Date Sept 2023 ?  ?- check blood sugar at prescribed times ?- check blood sugar if I feel it is too high or too low ?- enter blood sugar readings and medication or insulin into daily log ?- take the blood sugar log to all doctor visits ?- take the blood sugar meter to all doctor visits  ?  ?Why is this important?   ?Checking your blood sugar at home helps to keep it from getting very high or very low.  ?Writing the results in a diary or log helps the doctor know how to care for you.  ?Your blood sugar log should have the time, date and the results.  ?Also, write down the amount of insulin or other medicine that you take.  ?Other information, like what you ate, exercise done and how you were feeling, will also be helpful.   ? ?Plan: Telephone follow up appointment with care management team member scheduled for:  6 months  ? ?Tomasa Blase, PharmD ?Clinical Pharmacist, [place] [facility]  ? ?Please call the care guide team at [phone] if you need to cancel or reschedule your appointment.  ? ?The patient verbalized understanding of instructions, educational materials, and care plan provided today and declined offer to receive copy of patient instructions, educational materials, and care plan.  ? ?

## 2021-06-21 NOTE — Progress Notes (Cosign Needed)
Chronic Care Management Pharmacy Note  06/21/2021 Name:  SARH KIRSCHENBAUM MRN:  [id number] DOB:  January 23, 1935  Summary: -Patient reports compliance to current medications, denies any issues with medications  -Notes that SBP at home has been averaging 120-130's  -Checking BG on occasion, averages 128-140 - noted that A1c has increase to 7.8%  Recommendations/Changes made from today's visit: -Recommending for patient to continue monitoring blood pressure and blood sugars, will reach out should BP/BG become uncontrolled prior to next visit  -Recommended for patient to switch to diet ginger ale to help keep BG under better control - discussed carb amounts in regular sodas / foods that she frequently eats  - potential to recommend starting SGLT2 should A1c remain elevated despite lifestyle changes with next PCP visit  -Reasonable to trial increase in simvastatin dose to bring LDL <70 should LDL remain elevated with next check   Subjective: LABRINA LINES is an 87 y.o. year old female who is a primary patient of Plotnikov, Georgina Quint, MD.  The CCM team was consulted for assistance with disease management and care coordination needs.    Engaged with patient by telephone for follow up visit in response to provider referral for pharmacy case management and/or care coordination services.   Consent to Services:  The patient was given the following information about Chronic Care Management services today, agreed to services, and gave verbal consent: 1. CCM service includes personalized support from designated clinical staff supervised by the primary care provider, including individualized plan of care and coordination with other care providers 2. 24/7 contact phone numbers for assistance for urgent and routine care needs. 3. Service will only be billed when office clinical staff spend 20 minutes or more in a month to coordinate care. 4. Only one practitioner may furnish and bill the service in a calendar  month. 5.The patient may stop CCM services at any time (effective at the end of the month) by phone call to the office staff. 6. The patient will be responsible for cost sharing (co-pay) of up to 20% of the service fee (after annual deductible is met). Patient agreed to services and consent obtained.  Patient Care Team: Plotnikov, Georgina Quint, MD as PCP - General (Internal Medicine) Rennis Golden [name], MD as PCP - Cardiology (Cardiology) Sallye Lat, MD as Consulting Physician (Ophthalmology) Erlene Quan Vinnie Level, [facility] as Pharmacist (Pharmacist)  Recent office visits:  05/08/2021 - Dr. Posey Rea - no changes to medications - follow up in 3 months    Recent consult visits:  None since last visit    Hospital visits:  None in previous 6 months  Objective:  Lab Results  Component Value Date   CREATININE 1.14 05/08/2021   BUN 16 05/08/2021   GFR 44.18 (L) 05/08/2021   GFRNONAA >60 06/23/2020   GFRAA 54 (L) 10/30/2019   NA 138 05/08/2021   K 3.8 05/08/2021   CALCIUM 9.3 05/08/2021   CO2 30 05/08/2021   GLUCOSE 167 (H) 05/08/2021    Lab Results  Component Value Date/Time   HGBA1C 7.8 (H) 05/08/2021 11:58 AM   HGBA1C 7.6 (H) 02/05/2021 09:24 AM   GFR 44.18 (L) 05/08/2021 11:58 AM   GFR 47.22 (L) 02/05/2021 09:24 AM    Last diabetic Eye exam:  Lab Results  Component Value Date/Time   HMDIABEYEEXA No Retinopathy 04/17/2017 04:31 PM    Last diabetic Foot exam:  No results found for: HMDIABFOOTEX   Lab Results  Component Value Date   CHOL 168 10/29/2020  HDL 66.40 10/29/2020   LDLCALC 85 10/29/2020   LDLDIRECT 105.5 11/26/2010   TRIG 82.0 10/29/2020   CHOLHDL 3 10/29/2020    Hepatic Function Latest Ref Rng & Units 05/08/2021 02/05/2021 10/29/2020  Total Protein 6.0 - 8.3 g/dL 7.3 7.2 6.8  Albumin 3.5 - 5.2 g/dL 4.3 4.2 4.1  AST 0 - 37 U/L $Remo'19 15 16  'ZSMZp$ ALT 0 - 35 U/L $Remo'13 9 10  'dAied$ Alk Phosphatase 39 - 117 U/L 60 69 83  Total Bilirubin 0.2 - 1.2 mg/dL 0.5 0.6 0.4  Bilirubin,  Direct 0.0 - 0.2 mg/dL - - -    Lab Results  Component Value Date/Time   TSH 1.65 02/05/2021 09:24 AM   TSH 1.39 10/29/2020 09:41 AM    CBC Latest Ref Rng & Units 02/05/2021 10/29/2020 06/23/2020  WBC 4.0 - 10.5 K/uL 4.5 4.4 6.4  Hemoglobin 12.0 - 15.0 g/dL 12.3 11.9(L) 12.3  Hematocrit 36.0 - 46.0 % 37.6 35.4(L) 38.6  Platelets 150.0 - 400.0 K/uL 201.0 202.0 243    Lab Results  Component Value Date/Time   VD25OH 38.3 11/18/2018 11:32 AM   VD25OH 42 08/04/2013 10:28 AM    Clinical ASCVD: No  The ASCVD Risk score (Arnett DK, et al., 2019) failed to calculate for the following reasons:   The 2019 ASCVD risk score is only valid for ages 84 to 85    Depression screen PHQ 2/9 02/28/2021 07/12/2020 11/29/2019  Decreased Interest 0 0 0  Down, Depressed, Hopeless 0 0 0  PHQ - 2 Score 0 0 0  Altered sleeping - - -  Tired, decreased energy - - -  Change in appetite - - -  Feeling bad or failure about yourself  - - -  Trouble concentrating - - -  Moving slowly or fidgety/restless - - -  Suicidal thoughts - - -  PHQ-9 Score - - -  Difficult doing work/chores - - -  Some recent data might be hidden    Social History   Tobacco Use  Smoking Status Never  Smokeless Tobacco Never   BP Readings from Last 3 Encounters:  05/08/21 132/64  01/29/21 132/70  10/25/20 130/62   Pulse Readings from Last 3 Encounters:  05/08/21 66  01/29/21 64  10/25/20 (!) 55   Wt Readings from Last 3 Encounters:  05/08/21 189 lb (85.7 kg)  01/29/21 191 lb (86.6 kg)  10/25/20 184 lb 12.8 oz (83.8 kg)   BMI Readings from Last 3 Encounters:  05/08/21 30.51 kg/m  01/29/21 30.83 kg/m  10/25/20 29.83 kg/m    Assessment/Interventions: Review of patient past medical history, allergies, medications, health status, including review of consultants reports, laboratory and other test data, was performed as part of comprehensive evaluation and provision of chronic care management services.   SDOH:   (Social Determinants of Health) assessments and interventions performed: Yes  SDOH Screenings   Alcohol Screen: Not on file  Depression (PHQ2-9): Low Risk    PHQ-2 Score: 0  Financial Resource Strain: Low Risk    Difficulty of Paying Living Expenses: Not very hard  Food Insecurity: Not on file  Housing: Not on file  Physical Activity: Not on file  Social Connections: Not on file  Stress: Not on file  Tobacco Use: Low Risk    Smoking Tobacco Use: Never   Smokeless Tobacco Use: Never   Passive Exposure: Not on file  Transportation Needs: Not on file    [employer]  Allergies  Allergen Reactions  Clarithromycin     REACTION: unspecified   Fosamax [Alendronate Sodium]     achy   Guaifenesin Er     itching   Metformin     REACTION: pt states INTOL to Metformin "felt drained"   Prandin [Repaglinide]     Prandin dropped CBGs to 66 once - she stopped it.   Tape     Adhesive tape---rash    Medications Reviewed Today     Reviewed by Tomasa Blase, [facility] (Pharmacist) on 06/21/21 at [place]  Med List Status: <None>   Medication Order Taking? Sig Documenting Provider Last Dose Status Informant  ACCU-CHEK AVIVA PLUS test strip [id number] No USE TO CHECK BLOOD SUGARS DAILY Plotnikov, Evie Lacks, MD Taking Active   Accu-Chek Softclix Lancets lancets [id number] No USE  AS  INSTRUCTED Plotnikov, Evie Lacks, MD Taking Active   acetaminophen (TYLENOL) 500 MG tablet [id number] No Take 500 mg by mouth every 6 (six) hours as needed. [provider] Taking Active   Alcohol Swabs (B-D SINGLE USE SWABS REGULAR) PADS [id number] No USE TO CLEAN INJECTION SITE TO CHECK BLOOD SUGARS DAILY Plotnikov, Evie Lacks, MD Taking Active   ALPRAZolam (XANAX) 0.5 MG tablet [id number] No TAKE 1/2 TO 1 TABLET BY MOUTH THREE TIMES DAILY AS NEEDED FOR ANXIETY. DO NOT TAKE WITH TRAMADOL.THIS IS A 30 DAY SUPPLY Plotnikov, Evie Lacks, MD Taking Active   amLODipine (NORVASC) 5 MG tablet [id number]  TAKE 1 TABLET(5  MG) BY MOUTH DAILY Plotnikov, Evie Lacks, MD  Active   aspirin 81 MG tablet [id number] No Take 81 mg by mouth daily. [provider] Taking Active Multiple Informants  Cholecalciferol (VITAMIN D3) 50 MCG (2000 UT) capsule [id number] No TAKE 1 CAPSULE BY MOUTH ONCE DAILY Plotnikov, Evie Lacks, MD Taking Active   diclofenac Sodium (VOLTAREN) 1 % GEL [id number] No Apply 2 g topically 4 (four) times daily. Aundra Dubin, PA-C Taking Active   furosemide (LASIX) 20 MG tablet [id number] No TAKE 1 TABLET(20 MG) BY MOUTH DAILY AS NEEDED FOR SWELLING Hilty, Nadean Corwin, MD Taking Active   hydroxypropyl methylcellulose (ISOPTO TEARS) 2.5 % ophthalmic solution [id number] No Place 1 drop into both eyes 2 (two) times daily. [provider] Taking Active Multiple Informants  hyoscyamine (LEVSIN) 0.125 MG tablet [id number]  Take 1 tablet (0.125 mg total) by mouth every 6 (six) hours as needed for up to 10 days for cramping. Plotnikov, Evie Lacks, MD  Expired 05/18/21 2359   isosorbide mononitrate (IMDUR) 30 MG 24 hr tablet [id number] No TAKE 1 TABLET BY MOUTH EVERY DAY Hilty, Nadean Corwin, MD Taking Active   metoprolol succinate (TOPROL-XL) 50 MG 24 hr tablet [id number]  TAKE 1 AND 1/2 TABLETS BY MOUTH TWICE DAILY Pixie Casino, MD  Active   pantoprazole (PROTONIX) 40 MG tablet [id number]  TAKE 1 TABLET BY MOUTH EVERY DAY Hilty, Nadean Corwin, MD  Active   simvastatin (ZOCOR) 20 MG tablet [id number] No TAKE 1 TABLET BY MOUTH AT BEDTIME Plotnikov, Evie Lacks, MD Taking Active   traMADol (ULTRAM) 50 MG tablet [id number] No Take 1 tablet (50 mg total) by mouth 3 (three) times daily as needed. Plotnikov, Evie Lacks, MD Taking Active   warfarin (COUMADIN) 5 MG tablet [id number]  TAKE 1 TABLET BY MOUTH EVERY DAY AS DIRECTED BY COUMADIN CLINIC Hilty, Nadean Corwin, MD  Active             Patient Active Problem List   Diagnosis Date Noted   Atherosclerosis of aorta ([place])  10/25/2020   Arthralgia 10/25/2020   Hip pain, acute,  left 10/01/2020   Fall 10/01/2020   Wrist contusion 10/01/2020   Hypokalemia 07/12/2020   Gastroenteritis 04/27/2020   Dehydration 04/27/2020   Urinary frequency 02/28/2020   Peroneal tendinitis 11/01/2019   Spontaneous rupture of extensor tendon of left foot 10/28/2019   Contusion of left foot 10/27/2019   Sinusitis 04/13/2019   Rash 12/05/2016   Dyspnea 04/24/2016   Tricuspid valve insufficiency 04/24/2016   Cerumen impaction 11/19/2015   Ear discomfort 11/13/2015   Grief at loss of child 09/14/2015   Well adult exam 09/14/2015   Dyslipidemia    PSVT (paroxysmal supraventricular tachycardia) (HCC)    PAF (paroxysmal atrial fibrillation) ([place])    Viral gastroenteritis 07/21/2015   Acute upper respiratory infection 04/17/2015   Edema 11/08/2014   Chronic venous insufficiency 11/08/2014   Diarrhea 05/01/2014   Mass of left thigh 12/14/2013   Microhematuria 12/14/2013   Long term (current) use of anticoagulants 08/19/2013   CAD (coronary artery disease) 05/21/2011   Atrial tachycardia ([place]) 11/19/2010   CHEST PAIN 08/07/2009   Vitamin D deficiency 04/19/2008   SUPERFICIAL THROMBOPHLEBITIS 10/20/2007   DM2 (diabetes mellitus, type 2) ([place]) 03/16/2007   HYPERCHOLESTEROLEMIA 03/15/2007   Anxiety 03/15/2007   Essential hypertension 03/15/2007   Mitral valve disorder 03/15/2007   BRONCHITIS 03/15/2007   HIATAL HERNIA 03/15/2007   Irritable bowel syndrome 03/15/2007   Osteoarthritis 03/15/2007   Osteoporosis 03/15/2007    Immunization History  Administered Date(s) Administered   Fluad Quad(high Dose 65+) 02/15/2019, 02/07/2020, 01/29/2021   Influenza Split 01/21/2011, 02/24/2012   Influenza Whole 02/02/2009, 05/21/2010   Influenza, High Dose Seasonal PF 03/18/2016, 03/31/2017, 02/10/2018   Influenza,inj,Quad PF,6+ Mos 01/11/2013, 05/01/2014, 02/08/2015   Moderna SARS-COV2 Booster Vaccination 08/03/2020   Moderna Sars-Covid-2 Vaccination 06/13/2019, 07/11/2019   Pfizer  Covid-19 Vaccine Bivalent Booster 74yrs & up 01/10/2021   Tdap 10/01/2020    Conditions to be addressed/monitored:  Hypertension, Hyperlipidemia, Diabetes, Atrial Fibrillation, Chronic Kidney Disease, Anxiety, GERD, and Pain from fall  Care Plan : CCM Care Plan  Updates made by Tomasa Blase, RPH since 06/21/2021 12:00 AM     Problem: Hypertension, Hyperlipidemia, Diabetes, Atrial Fibrillation, Chronic Kidney Disease, Anxiety, GERD, and Pain from fall   Priority: High  Onset Date: 01/04/2021     Long-Range Goal: Disease Management   Start Date: 01/04/2021  Expected End Date: 06/22/2022  This Visit's Progress: On track  Recent Progress: On track  Priority: High  Note:   Current Barriers:  Unable to independently monitor therapeutic efficacy  Pharmacist Clinical Goal(s):  Patient will achieve adherence to monitoring guidelines and medication adherence to achieve therapeutic efficacy maintain control of LDL, BP, and BG as evidenced by next lipid panel /BP and BG logs   through collaboration with PharmD and provider.   Interventions: 1:1 collaboration with Plotnikov, Evie Lacks, MD regarding development and update of comprehensive plan of care as evidenced by provider attestation and co-signature Inter-disciplinary care team collaboration (see longitudinal plan of care) Comprehensive medication review performed; medication list updated in electronic medical record  Hypertension (BP goal <140/90) -Controlled with most recent office visits -Current treatment: Metoprolol Succinate 50mg  - 1.5 tablets twice daily  Amlodipine 5mg  - 1 tablet daily Isosorbide Mononitrate 30mg  - 1 tablet daily  Furosemide 20mg  - 1 tablet daily as needed - taking 2-3 times a week   -Medications previously tried: n/a  -Current home readings: SBP averaging 120-130 at home  BP Readings from  Last 3 Encounters:  05/08/21 132/64  01/29/21 132/70  10/25/20 130/62  -Current dietary habits: reports that she  tries not to add salt to her foods, tries to eat a sodium reduced diet - does get meals on wheels where she cannot control the sodium content  -Current exercise habits: has not been very active since her fall in June, is slowly improving and increasing her activity- follows with PT once weekly   -Denies hypotensive/hypertensive symptoms -Educated on BP goals and benefits of medications for prevention of heart attack, stroke and kidney damage; Daily salt intake goal < 2300 mg; Exercise goal of 150 minutes per week; Importance of home blood pressure monitoring; Proper BP monitoring technique; Symptoms of hypotension and importance of maintaining adequate hydration; -Counseled to monitor BP at home daily, document, and provide log at future appointments -Counseled on diet and exercise extensively Recommended to continue current medication  Hyperlipidemia/ Coronary Artery Disease : (LDL goal < 70) -Controlled Lab Results  Component Value Date   LDLCALC 85 10/29/2020  -Current treatment: Simvastatin 20mg  - 1 tablet daily  Aspirin 81mg  - 1 tablet daily  -Medications previously tried: n/a  -Current dietary patterns: notes that she does eat red meat on occasion, gets some of her meals through meals on wheels so does not always have a choice as to what she is eating  -Current exercise habits: has not been very active since her fall in June, is slowly improving and increasing her activity- follows with PT once weekly   -Educated on Cholesterol goals;  Benefits of statin for ASCVD risk reduction; Importance of limiting foods high in cholesterol; Exercise goal of 150 minutes per week; -Counseled on diet and exercise extensively Recommended to continue current medication - reasonable to trial increase in simvastatin should next lipid panel show LDL >70  Diabetes (A1c goal <7%) -Not ideally controlled last A1c has increased  Lab Results  Component Value Date   HGBA1C 7.8 (H) 05/08/2021   -Current medications: N/a -Medications previously tried: metformin, repaglinide  -Current home glucose readings fasting glucose: 128-140 -Denies hypoglycemic/hyperglycemic symptoms -Current meal patterns:  breakfast: eggs with a piece of bacon / beef Inda Merlin with grits  with fruit - 1 cup of decaf coffee  lunch: meals on wheels, at times can be a sandwich with a side dinner: protein, potatoes, greens, salad snacks: fruit, chips, peanuts drinks: ginger ale on occasion / water  -Current exercise: has not been very active since her fall in June, is slowly improving and increasing her activity- follows with PT once weekly   -Educated on A1c and blood sugar goals; Complications of diabetes including kidney damage, retinal damage, and cardiovascular disease; Exercise goal of 150 minutes per week; Benefits of routine self-monitoring of blood sugar; Carbohydrate counting and/or plate method -Counseled to check feet daily and get yearly eye exams -Counseled on diet and exercise extensively -Patient to continue to monitor blood sugars, will add medication in future if necessary - cautious due to previous issues with hypoglycemia on repaglinide, believe that hypoglycemia risk could be greater with SU - continue with dietary changes at this time  -Due to CKD - likely would be a good candidate to start SGLT2 to help with A1c control   Atrial Fibrillation (Goal: prevent stroke and major bleeding) -Controlled -CHADSVASC: 4 -Current treatment: Rate control: Metoprolol Succinate 50mg  - 1.5 tablets twice daily  Anticoagulation: Warfarin 5mg  - taken as directed  -Medications previously tried: n/a -Home BP and HR readings: unknown at this time, could  only recall that SBP is usually averaging 130-140  -Counseled on increased risk of stroke due to Afib and benefits of anticoagulation for stroke prevention; importance of adherence to anticoagulant exactly as prescribed; bleeding risk associated with  warfarin and importance of self-monitoring for signs/symptoms of bleeding; avoidance of NSAIDs due to increased bleeding risk with anticoagulants; importance of regular laboratory monitoring; seeking medical attention after a head injury or if there is blood in the urine/stool; -Counseled on diet and exercise extensively Recommended to continue current medication  Anxiety (Goal: Prevention/control of anxiety attacks) -Controlled -Current treatment: Alprazolam 0.5mg  - 1/2-1 tablet twice daily as needed  -Medications previously tried/failed: diazepam  -GAD7: 0 -Educated on Benefits of medication for symptom control Benefits of cognitive-behavioral therapy with or without medication -Recommended to continue current medication  GERD (Goal: Prevention/control of acid reflux) -Controlled -Current treatment  Pantoprazole 40mg  - 1 tablet daily as needed -Medications previously tried: maalox  -Counseled on diet and exercise extensively Recommended to continue current medication  CKD (Goal: Prevention of disease progression ) -Stable -Last eGFR 44.18 mL/min -Current treatment  Counseled patient on avoidance of nephrotoxic agents such as NSAIDs, also advised on adequate BP and BG control to prevent kidney damage  -Recommended to continue current medication  Pain from fall (Goal: Pain control) -Controlled -Current treatment  Tramadol 50mg  - 1-2 tablet daily as needed  -Medications previously tried: APAP, voltaren gel, voltaren, nabumetone, norco  -Recommended to continue current medication   Health Maintenance -Vaccine gaps: Shingles, COVID booster, iPNA vaccine -Current therapy:  Isosopt Tears 2.5% ophthalmic solution - 1 drop into both eyes daily as needed  Vitamin D3 2000 units - 1 tablet daily  Coricidin - 1 tablet every 6 hours as needed for cough/ cold  APAP 500mg  - 1 tablet every 6 hours as needed for pain -Educated on Cost vs benefit of each product must be carefully  weighed by individual consumer -Patient is satisfied with current therapy and denies issues -Recommended to continue current medication  Patient Goals/Self-Care Activities Patient will:  - take medications as prescribed check glucose daily, document, and provide at future appointments check blood pressure daily, document, and provide at future appointments engage in dietary modifications by reducing sodium, / high cholesterol foods / moderating of carbohydrate intake  Follow Up Plan: Telephone follow up appointment with care management team member scheduled for: 6 months The patient has been provided with contact information for the care management team and has been advised to call with any health related questions or concerns.          Medication Assistance: None required.  Patient affirms current coverage meets needs.  Patient's preferred pharmacy is:  [facility] [phone] - [place], [place] - [phone] [facility] AT [place] [address] Phone: [phone] Fax: [phone]  [place], [place] [address] Phone: [phone] Fax: [phone]  Uses pill box? Yes Pt endorses 100% compliance  Care Plan and Follow Up Patient Decision:  Patient agrees to Care Plan and Follow-up.  Plan: Telephone follow up appointment with care management team member scheduled for:  3 months months and The patient has been provided with contact information for the care management team and has been advised to call with any health related questions or concerns.   Tomasa Blase, PharmD Clinical Pharmacist, [place]

## 2021-06-23 ENCOUNTER — Other Ambulatory Visit: Payer: Self-pay | Admitting: Internal Medicine

## 2021-06-26 ENCOUNTER — Other Ambulatory Visit: Payer: Self-pay

## 2021-06-26 ENCOUNTER — Ambulatory Visit (INDEPENDENT_AMBULATORY_CARE_PROVIDER_SITE_OTHER): Payer: Medicare HMO

## 2021-06-26 DIAGNOSIS — I480 Paroxysmal atrial fibrillation: Secondary | ICD-10-CM

## 2021-06-26 DIAGNOSIS — Z7901 Long term (current) use of anticoagulants: Secondary | ICD-10-CM

## 2021-06-26 LAB — POCT INR: INR: 2 (ref 2.0–3.0)

## 2021-06-26 NOTE — Patient Instructions (Signed)
Continue taking 1 tablet daily except 1/2 tablet each Sunday.  Repeat INR in 8 weeks. ? ?*Consistent amount of greens every week* ? ?

## 2021-07-12 DIAGNOSIS — I2583 Coronary atherosclerosis due to lipid rich plaque: Secondary | ICD-10-CM

## 2021-07-12 DIAGNOSIS — I480 Paroxysmal atrial fibrillation: Secondary | ICD-10-CM | POA: Diagnosis not present

## 2021-07-12 DIAGNOSIS — E785 Hyperlipidemia, unspecified: Secondary | ICD-10-CM

## 2021-07-12 DIAGNOSIS — E119 Type 2 diabetes mellitus without complications: Secondary | ICD-10-CM

## 2021-07-12 DIAGNOSIS — I119 Hypertensive heart disease without heart failure: Secondary | ICD-10-CM | POA: Diagnosis not present

## 2021-07-14 ENCOUNTER — Other Ambulatory Visit: Payer: Self-pay | Admitting: Internal Medicine

## 2021-08-02 ENCOUNTER — Other Ambulatory Visit: Payer: Self-pay | Admitting: Internal Medicine

## 2021-08-07 ENCOUNTER — Encounter: Payer: Self-pay | Admitting: Internal Medicine

## 2021-08-07 ENCOUNTER — Ambulatory Visit (INDEPENDENT_AMBULATORY_CARE_PROVIDER_SITE_OTHER): Payer: Medicare HMO | Admitting: Internal Medicine

## 2021-08-07 VITALS — BP 136/80 | HR 59 | Temp 97.8°F | Ht 66.0 in | Wt 188.0 lb

## 2021-08-07 DIAGNOSIS — Z7901 Long term (current) use of anticoagulants: Secondary | ICD-10-CM | POA: Diagnosis not present

## 2021-08-07 DIAGNOSIS — I480 Paroxysmal atrial fibrillation: Secondary | ICD-10-CM

## 2021-08-07 DIAGNOSIS — R609 Edema, unspecified: Secondary | ICD-10-CM | POA: Diagnosis not present

## 2021-08-07 DIAGNOSIS — M47816 Spondylosis without myelopathy or radiculopathy, lumbar region: Secondary | ICD-10-CM | POA: Diagnosis not present

## 2021-08-07 DIAGNOSIS — E119 Type 2 diabetes mellitus without complications: Secondary | ICD-10-CM | POA: Diagnosis not present

## 2021-08-07 LAB — COMPREHENSIVE METABOLIC PANEL
ALT: 10 U/L (ref 0–35)
AST: 16 U/L (ref 0–37)
Albumin: 4.2 g/dL (ref 3.5–5.2)
Alkaline Phosphatase: 69 U/L (ref 39–117)
BUN: 15 mg/dL (ref 6–23)
CO2: 29 mEq/L (ref 19–32)
Calcium: 8.9 mg/dL (ref 8.4–10.5)
Chloride: 104 mEq/L (ref 96–112)
Creatinine, Ser: 1.22 mg/dL — ABNORMAL HIGH (ref 0.40–1.20)
GFR: 40.66 mL/min — ABNORMAL LOW (ref >60.00)
Glucose, Bld: 164 mg/dL — ABNORMAL HIGH (ref 70–99)
Potassium: 4.2 mEq/L (ref 3.5–5.1)
Sodium: 138 mEq/L (ref 135–145)
Total Bilirubin: 0.5 mg/dL (ref 0.2–1.2)
Total Protein: 7.1 g/dL (ref 6.0–8.3)

## 2021-08-07 LAB — HEMOGLOBIN A1C: Hgb A1c MFr Bld: 7.3 % — ABNORMAL HIGH (ref 4.6–6.5)

## 2021-08-07 LAB — TSH: TSH: 0.87 u[IU]/mL (ref 0.35–5.50)

## 2021-08-07 NOTE — Assessment & Plan Note (Signed)
Cont on Coumadin, Toprol XL ?

## 2021-08-07 NOTE — Assessment & Plan Note (Signed)
  On diet  

## 2021-08-07 NOTE — Assessment & Plan Note (Signed)
On Coumadin ? Potential benefits of a long term Coumadin use as well as potential risks  and complications were explained to the patient and were aknowledged. ?

## 2021-08-07 NOTE — Assessment & Plan Note (Signed)
Continue w/Tramadol prn ? Potential benefits of a long term opioids use as well as potential risks (i.e. addiction risk, apnea etc) and complications (i.e. Somnolence, constipation and others) were explained to the patient and were aknowledged. ?Blue-Emu cream was recommended to use 2-3 times a day ?

## 2021-08-07 NOTE — Progress Notes (Signed)
? ?Subjective:  ?Patient ID: [name], female    DOB: [birth year]  Age: 87 y.o. MRN: [id number] ? ?CC: No chief complaint on file. ? ? ?HPI ?[name] presents for DM, HTN, LBP ? ?Outpatient Medications Prior to Visit  ?Medication Sig Dispense Refill  ? ACCU-CHEK AVIVA PLUS test strip USE TO CHECK BLOOD SUGARS DAILY 100 strip 2  ? Accu-Chek Softclix Lancets lancets USE  AS  INSTRUCTED 100 each 2  ? acetaminophen (TYLENOL) 500 MG tablet Take 500 mg by mouth every 6 (six) hours as needed.    ? Alcohol Swabs (B-D SINGLE USE SWABS REGULAR) PADS USE TO CLEAN INJECTION SITE TO CHECK BLOOD SUGARS DAILY 100 each 2  ? ALPRAZolam (XANAX) 0.5 MG tablet TAKE 1/2 TO 1 TABLET BY MOUTH THREE TIMES DAILY AS NEEDED FOR ANXIETY. DO NOT TAKE WITH TRAMADOL.THIS IS A 30 DAY SUPPLY 90 tablet 3  ? amLODipine (NORVASC) 5 MG tablet TAKE 1 TABLET(5 MG) BY MOUTH DAILY 90 tablet 3  ? aspirin 81 MG tablet Take 81 mg by mouth daily.    ? Cholecalciferol (VITAMIN D3) 50 MCG (2000 UT) capsule TAKE 1 CAPSULE BY MOUTH ONCE DAILY 100 capsule 3  ? diclofenac Sodium (VOLTAREN) 1 % GEL Apply 2 g topically 4 (four) times daily. 150 g 1  ? furosemide (LASIX) 20 MG tablet TAKE 1 TABLET(20 MG) BY MOUTH DAILY AS NEEDED FOR SWELLING 30 tablet 3  ? hydroxypropyl methylcellulose (ISOPTO TEARS) 2.5 % ophthalmic solution Place 1 drop into both eyes 2 (two) times daily.    ? isosorbide mononitrate (IMDUR) 30 MG 24 hr tablet TAKE 1 TABLET BY MOUTH EVERY DAY 90 tablet 0  ? metoprolol succinate (TOPROL-XL) 50 MG 24 hr tablet TAKE 1 AND 1/2 TABLETS BY MOUTH TWICE DAILY 270 tablet 3  ? pantoprazole (PROTONIX) 40 MG tablet TAKE 1 TABLET BY MOUTH EVERY DAY 90 tablet 3  ? simvastatin (ZOCOR) 20 MG tablet TAKE 1 TABLET BY MOUTH AT BEDTIME 90 tablet 1  ? traMADol (ULTRAM) 50 MG tablet Take 1 tablet (50 mg total) by mouth 3 (three) times daily as needed. 90 tablet 2  ? warfarin (COUMADIN) 5 MG tablet TAKE 1 TABLET BY MOUTH EVERY DAY AS DIRECTED BY COUMADIN  CLINIC 90 tablet 0  ? hyoscyamine (LEVSIN) 0.125 MG tablet Take 1 tablet (0.125 mg total) by mouth every 6 (six) hours as needed for up to 10 days for cramping. 100 tablet 3  ? ?No facility-administered medications prior to visit.  ? ? ?ROS: ?Review of Systems  ?Constitutional:  Negative for activity change, appetite change, chills, fatigue and unexpected weight change.  ?HENT:  Negative for congestion, mouth sores and sinus pressure.   ?Eyes:  Negative for visual disturbance.  ?Respiratory:  Negative for cough and chest tightness.   ?Gastrointestinal:  Negative for abdominal pain and nausea.  ?Genitourinary:  Negative for difficulty urinating, frequency and vaginal pain.  ?Musculoskeletal:  Positive for arthralgias, back pain and gait problem.  ?Skin:  Negative for pallor and rash.  ?Neurological:  Negative for dizziness, tremors, weakness, numbness and headaches.  ?Psychiatric/Behavioral:  Negative for confusion, sleep disturbance and suicidal ideas. The patient is nervous/anxious.   ? ?Objective:  ?BP 136/80 (BP Location: Left Arm, Patient Position: Sitting, Cuff Size: Large)   Pulse (!) 59   Temp 97.8 ?F (36.6 ?C) (Oral)   Ht '5\' 6"'$  (1.676 m)   Wt 188 lb (85.3 kg)   SpO2 95%   BMI 30.34 kg/m?  ? ?  BP Readings from Last 3 Encounters:  ?08/07/21 136/80  ?05/08/21 132/64  ?01/29/21 132/70  ? ? ?Wt Readings from Last 3 Encounters:  ?08/07/21 188 lb (85.3 kg)  ?05/08/21 189 lb (85.7 kg)  ?01/29/21 191 lb (86.6 kg)  ? ? ?Physical Exam ?Constitutional:   ?   General: She is not in acute distress. ?   Appearance: She is well-developed. She is obese.  ?HENT:  ?   Head: Normocephalic.  ?   Right Ear: External ear normal.  ?   Left Ear: External ear normal.  ?   Nose: Nose normal.  ?Eyes:  ?   General:     ?   Right eye: No discharge.     ?   Left eye: No discharge.  ?   Conjunctiva/sclera: Conjunctivae normal.  ?   Pupils: Pupils are equal, round, and reactive to light.  ?Neck:  ?   Thyroid: No thyromegaly.  ?    Vascular: No JVD.  ?   Trachea: No tracheal deviation.  ?Cardiovascular:  ?   Rate and Rhythm: Normal rate and regular rhythm.  ?   Heart sounds: Normal heart sounds.  ?Pulmonary:  ?   Effort: No respiratory distress.  ?   Breath sounds: No stridor. No wheezing.  ?Abdominal:  ?   General: Bowel sounds are normal. There is no distension.  ?   Palpations: Abdomen is soft. There is no mass.  ?   Tenderness: There is no abdominal tenderness. There is no guarding or rebound.  ?Musculoskeletal:     ?   General: Tenderness present.  ?   Cervical back: Normal range of motion and neck supple. No rigidity.  ?Lymphadenopathy:  ?   Cervical: No cervical adenopathy.  ?Skin: ?   Findings: No erythema or rash.  ?Neurological:  ?   Cranial Nerves: No cranial nerve deficit.  ?   Motor: No abnormal muscle tone.  ?   Coordination: Coordination normal.  ?   Deep Tendon Reflexes: Reflexes normal.  ?Psychiatric:     ?   Behavior: Behavior normal.     ?   Thought Content: Thought content normal.     ?   Judgment: Judgment normal.  ?Stiff LS ? ?Lab Results  ?Component Value Date  ? WBC 4.5 02/05/2021  ? HGB 12.3 02/05/2021  ? HCT 37.6 02/05/2021  ? PLT 201.0 02/05/2021  ? GLUCOSE 167 (H) 05/08/2021  ? CHOL 168 10/29/2020  ? TRIG 82.0 10/29/2020  ? HDL 66.40 10/29/2020  ? LDLDIRECT 105.5 11/26/2010  ? [place] 85 10/29/2020  ? ALT 13 05/08/2021  ? AST 19 05/08/2021  ? NA 138 05/08/2021  ? K 3.8 05/08/2021  ? CL 102 05/08/2021  ? CREATININE 1.14 05/08/2021  ? BUN 16 05/08/2021  ? CO2 30 05/08/2021  ? TSH 1.65 02/05/2021  ? INR 2.0 06/26/2021  ? HGBA1C 7.8 (H) 05/08/2021  ? ? ?MR ABDOMEN WWO CONTRAST ? ?Result Date: 03/30/2021 ?CLINICAL DATA:  Follow-up renal mass EXAM: MRI ABDOMEN WITHOUT AND WITH CONTRAST TECHNIQUE: Multiplanar multisequence MR imaging of the abdomen was performed both before and after the administration of intravenous contrast. CONTRAST:  31m MULTIHANCE GADOBENATE DIMEGLUMINE 529 MG/ML IV SOLN COMPARISON:  MRI abdomen  09/01/2019 FINDINGS: Study is limited due to motion. Lower chest: Not well visualized. Hepatobiliary: Liver is normal in size and contour. No evidence of hepatic steatosis. No suspicious hepatic mass visualized. A few tiny hyperintense T2 signal likely cysts. Gallbladder contains layering hypointense T2  signal material likely sludge and stones. No gallbladder wall thickening or pericholecystic edema. No biliary ductal dilatation identified. Pancreas: Mildly atrophic with no suspicious mass or ductal dilatation. No acute inflammatory changes visualized. Spleen:  Within normal limits in size and appearance. Adrenals/Urinary Tract: Stable 2.6 cm right adrenal gland adenoma. Left adrenal gland appears normal. Stable size and appearance of a partially exophytic heterogeneously enhancing cystic and solid mass at the posterior left kidney measuring 1.1 cm. No additional suspicious renal mass identified bilaterally. Multiple renal cortical cysts are again seen bilaterally measuring up to 3.6 cm on the right. No hydronephrosis. Stomach/Bowel: Visualized portions within the abdomen are unremarkable. Vascular/Lymphatic: No pathologically enlarged lymph nodes identified. No abdominal aortic aneurysm demonstrated. Other:  No ascites. Musculoskeletal: No suspicious bone lesions identified. IMPRESSION: 1. Stable size and appearance of the heterogeneously enhancing mixed cystic and solid mass at the posterior left kidney measuring 1.1 cm. No new mass or lymphadenopathy identified. 2. Stable right adrenal gland adenoma. 3. Multiple renal cortical cysts. Electronically Signed   By: Ofilia Neas M.D.   On: 03/30/2021 15:23  ? ? ?Assessment & Plan:  ? ?Problem List Items Addressed This Visit   ? ? Osteoarthritis  ?  Continue w/Tramadol prn ? Potential benefits of a long term opioids use as well as potential risks (i.e. addiction risk, apnea etc) and complications (i.e. Somnolence, constipation and others) were explained to the  patient and were aknowledged. ?Blue-Emu cream was recommended to use 2-3 times a day ?  ?  ? DM2 (diabetes mellitus, type 2) ([place]) - Primary  ?  On diet ? ?  ?  ? Relevant Orders  ? Comprehensive metabolic panel  ? Hemoglob

## 2021-08-07 NOTE — Patient Instructions (Signed)
Blue-Emu cream -- use 2-3 times a day ? ?

## 2021-08-07 NOTE — Assessment & Plan Note (Signed)
Cont w/Furosemide prn ?

## 2021-08-22 ENCOUNTER — Ambulatory Visit (INDEPENDENT_AMBULATORY_CARE_PROVIDER_SITE_OTHER): Payer: Medicare HMO

## 2021-08-22 DIAGNOSIS — I480 Paroxysmal atrial fibrillation: Secondary | ICD-10-CM | POA: Diagnosis not present

## 2021-08-22 DIAGNOSIS — Z7901 Long term (current) use of anticoagulants: Secondary | ICD-10-CM | POA: Diagnosis not present

## 2021-08-22 LAB — POCT INR: INR: 1.7 — AB (ref 2.0–3.0)

## 2021-08-22 NOTE — Patient Instructions (Signed)
TAKE 2 TABLETS TODAY ONLY and then Continue taking 1 tablet daily except 1/2 tablet each Sunday.  Repeat INR in 3 weeks. ? ?*Consistent amount of greens every week* ? ?

## 2021-08-29 DIAGNOSIS — H02831 Dermatochalasis of right upper eyelid: Secondary | ICD-10-CM | POA: Diagnosis not present

## 2021-08-29 DIAGNOSIS — H02834 Dermatochalasis of left upper eyelid: Secondary | ICD-10-CM | POA: Diagnosis not present

## 2021-08-29 DIAGNOSIS — H25813 Combined forms of age-related cataract, bilateral: Secondary | ICD-10-CM | POA: Diagnosis not present

## 2021-08-29 DIAGNOSIS — H353121 Nonexudative age-related macular degeneration, left eye, early dry stage: Secondary | ICD-10-CM | POA: Diagnosis not present

## 2021-08-29 DIAGNOSIS — E119 Type 2 diabetes mellitus without complications: Secondary | ICD-10-CM | POA: Diagnosis not present

## 2021-08-29 DIAGNOSIS — H04123 Dry eye syndrome of bilateral lacrimal glands: Secondary | ICD-10-CM | POA: Diagnosis not present

## 2021-08-29 LAB — HM DIABETES EYE EXAM

## 2021-09-03 ENCOUNTER — Encounter: Payer: Self-pay | Admitting: Internal Medicine

## 2021-09-05 ENCOUNTER — Other Ambulatory Visit: Payer: Self-pay | Admitting: Internal Medicine

## 2021-09-05 DIAGNOSIS — I480 Paroxysmal atrial fibrillation: Secondary | ICD-10-CM

## 2021-09-11 ENCOUNTER — Other Ambulatory Visit: Payer: Self-pay | Admitting: Internal Medicine

## 2021-09-12 ENCOUNTER — Ambulatory Visit (INDEPENDENT_AMBULATORY_CARE_PROVIDER_SITE_OTHER): Payer: Medicare HMO

## 2021-09-12 DIAGNOSIS — I480 Paroxysmal atrial fibrillation: Secondary | ICD-10-CM

## 2021-09-12 DIAGNOSIS — Z7901 Long term (current) use of anticoagulants: Secondary | ICD-10-CM

## 2021-09-12 LAB — POCT INR: INR: 1.5 — AB (ref 2.0–3.0)

## 2021-09-12 NOTE — Patient Instructions (Signed)
TAKE 2 TABLETS TODAY ONLY and then INCREASE TO 1 tablet daily.  Repeat INR in 3 weeks.  *Consistent amount of greens every week*

## 2021-09-21 ENCOUNTER — Other Ambulatory Visit: Payer: Self-pay | Admitting: Internal Medicine

## 2021-09-23 DIAGNOSIS — I110 Hypertensive heart disease with heart failure: Secondary | ICD-10-CM | POA: Diagnosis not present

## 2021-09-23 DIAGNOSIS — I25119 Atherosclerotic heart disease of native coronary artery with unspecified angina pectoris: Secondary | ICD-10-CM | POA: Diagnosis not present

## 2021-09-23 DIAGNOSIS — Z8249 Family history of ischemic heart disease and other diseases of the circulatory system: Secondary | ICD-10-CM | POA: Diagnosis not present

## 2021-09-23 DIAGNOSIS — Z87892 Personal history of anaphylaxis: Secondary | ICD-10-CM | POA: Diagnosis not present

## 2021-09-23 DIAGNOSIS — K219 Gastro-esophageal reflux disease without esophagitis: Secondary | ICD-10-CM | POA: Diagnosis not present

## 2021-09-23 DIAGNOSIS — E261 Secondary hyperaldosteronism: Secondary | ICD-10-CM | POA: Diagnosis not present

## 2021-09-23 DIAGNOSIS — D6869 Other thrombophilia: Secondary | ICD-10-CM | POA: Diagnosis not present

## 2021-09-23 DIAGNOSIS — I509 Heart failure, unspecified: Secondary | ICD-10-CM | POA: Diagnosis not present

## 2021-09-23 DIAGNOSIS — E669 Obesity, unspecified: Secondary | ICD-10-CM | POA: Diagnosis not present

## 2021-09-23 DIAGNOSIS — Z85528 Personal history of other malignant neoplasm of kidney: Secondary | ICD-10-CM | POA: Diagnosis not present

## 2021-09-23 DIAGNOSIS — Z888 Allergy status to other drugs, medicaments and biological substances status: Secondary | ICD-10-CM | POA: Diagnosis not present

## 2021-09-23 DIAGNOSIS — E785 Hyperlipidemia, unspecified: Secondary | ICD-10-CM | POA: Diagnosis not present

## 2021-09-23 DIAGNOSIS — E119 Type 2 diabetes mellitus without complications: Secondary | ICD-10-CM | POA: Diagnosis not present

## 2021-09-23 DIAGNOSIS — K589 Irritable bowel syndrome without diarrhea: Secondary | ICD-10-CM | POA: Diagnosis not present

## 2021-09-23 DIAGNOSIS — M1990 Unspecified osteoarthritis, unspecified site: Secondary | ICD-10-CM | POA: Diagnosis not present

## 2021-09-23 DIAGNOSIS — I4891 Unspecified atrial fibrillation: Secondary | ICD-10-CM | POA: Diagnosis not present

## 2021-09-23 DIAGNOSIS — Z6831 Body mass index (BMI) 31.0-31.9, adult: Secondary | ICD-10-CM | POA: Diagnosis not present

## 2021-09-26 ENCOUNTER — Ambulatory Visit (INDEPENDENT_AMBULATORY_CARE_PROVIDER_SITE_OTHER): Payer: Medicare HMO

## 2021-09-26 DIAGNOSIS — I480 Paroxysmal atrial fibrillation: Secondary | ICD-10-CM | POA: Diagnosis not present

## 2021-09-26 DIAGNOSIS — Z7901 Long term (current) use of anticoagulants: Secondary | ICD-10-CM

## 2021-09-26 LAB — POCT INR: INR: 2.3 (ref 2.0–3.0)

## 2021-09-26 NOTE — Patient Instructions (Signed)
Description   Continue on same dosage of Warfarin 1 tablet daily.  Repeat INR in 4 weeks.  *Consistent amount of greens every week*

## 2021-10-11 ENCOUNTER — Other Ambulatory Visit: Payer: Self-pay | Admitting: Internal Medicine

## 2021-10-22 ENCOUNTER — Ambulatory Visit (INDEPENDENT_AMBULATORY_CARE_PROVIDER_SITE_OTHER): Payer: Medicare HMO

## 2021-10-22 ENCOUNTER — Other Ambulatory Visit: Payer: Self-pay | Admitting: Internal Medicine

## 2021-10-22 DIAGNOSIS — I480 Paroxysmal atrial fibrillation: Secondary | ICD-10-CM

## 2021-10-22 DIAGNOSIS — Z7901 Long term (current) use of anticoagulants: Secondary | ICD-10-CM | POA: Diagnosis not present

## 2021-10-22 LAB — POCT INR: INR: 2.5 (ref 2.0–3.0)

## 2021-10-22 NOTE — Patient Instructions (Signed)
Description   Continue on same dosage of Warfarin 1 tablet daily.  Repeat INR in 5 weeks.  *Consistent amount of greens every week*

## 2021-11-08 ENCOUNTER — Other Ambulatory Visit: Payer: Self-pay | Admitting: Internal Medicine

## 2021-11-26 ENCOUNTER — Ambulatory Visit (INDEPENDENT_AMBULATORY_CARE_PROVIDER_SITE_OTHER): Payer: Medicare HMO

## 2021-11-26 DIAGNOSIS — I480 Paroxysmal atrial fibrillation: Secondary | ICD-10-CM

## 2021-11-26 DIAGNOSIS — Z7901 Long term (current) use of anticoagulants: Secondary | ICD-10-CM

## 2021-11-26 LAB — POCT INR: INR: 2.2 (ref 2.0–3.0)

## 2021-11-26 NOTE — Patient Instructions (Signed)
Continue on same dosage of Warfarin 1 tablet daily.  Repeat INR in 6 weeks.  *Consistent amount of greens every week*

## 2021-12-02 ENCOUNTER — Other Ambulatory Visit: Payer: Self-pay | Admitting: Cardiology

## 2021-12-02 DIAGNOSIS — I480 Paroxysmal atrial fibrillation: Secondary | ICD-10-CM

## 2021-12-05 ENCOUNTER — Other Ambulatory Visit: Payer: Self-pay | Admitting: Internal Medicine

## 2021-12-10 ENCOUNTER — Ambulatory Visit (INDEPENDENT_AMBULATORY_CARE_PROVIDER_SITE_OTHER): Payer: Medicare HMO | Admitting: Internal Medicine

## 2021-12-10 ENCOUNTER — Encounter: Payer: Self-pay | Admitting: Internal Medicine

## 2021-12-10 VITALS — BP 122/58 | HR 60 | Temp 98.0°F | Ht 66.0 in | Wt 191.2 lb

## 2021-12-10 DIAGNOSIS — R609 Edema, unspecified: Secondary | ICD-10-CM

## 2021-12-10 DIAGNOSIS — I10 Essential (primary) hypertension: Secondary | ICD-10-CM | POA: Diagnosis not present

## 2021-12-10 DIAGNOSIS — I480 Paroxysmal atrial fibrillation: Secondary | ICD-10-CM

## 2021-12-10 DIAGNOSIS — E119 Type 2 diabetes mellitus without complications: Secondary | ICD-10-CM

## 2021-12-10 DIAGNOSIS — Z23 Encounter for immunization: Secondary | ICD-10-CM

## 2021-12-10 LAB — COMPREHENSIVE METABOLIC PANEL
ALT: 10 U/L (ref 0–35)
AST: 15 U/L (ref 0–37)
Albumin: 4.2 g/dL (ref 3.5–5.2)
Alkaline Phosphatase: 52 U/L (ref 39–117)
BUN: 19 mg/dL (ref 6–23)
CO2: 27 mEq/L (ref 19–32)
Calcium: 9.2 mg/dL (ref 8.4–10.5)
Chloride: 103 mEq/L (ref 96–112)
Creatinine, Ser: 1.27 mg/dL — ABNORMAL HIGH (ref 0.40–1.20)
GFR: 38.65 mL/min — ABNORMAL LOW (ref >60.00)
Glucose, Bld: 129 mg/dL — ABNORMAL HIGH (ref 70–99)
Potassium: 4.1 mEq/L (ref 3.5–5.1)
Sodium: 137 mEq/L (ref 135–145)
Total Bilirubin: 0.5 mg/dL (ref 0.2–1.2)
Total Protein: 7.3 g/dL (ref 6.0–8.3)

## 2021-12-10 LAB — HEMOGLOBIN A1C: Hgb A1c MFr Bld: 7.6 % — ABNORMAL HIGH (ref 4.6–6.5)

## 2021-12-10 MED ORDER — ALPRAZOLAM 0.5 MG PO TABS: ORAL_TABLET | ORAL | 3 refills | Status: DC

## 2021-12-10 NOTE — Progress Notes (Signed)
Subjective:  Patient ID: [name], female    DOB: [birth year]  Age: 87 y.o. MRN: [id number]  CC: Follow-up (4 month f/u)   HPI [name] presents for anxiety, DM, GERD, HTN  Outpatient Medications Prior to Visit  Medication Sig Dispense Refill   Accu-Chek Softclix Lancets lancets USE AS INSTRUCTED 100 each 3   acetaminophen (TYLENOL) 500 MG tablet Take 500 mg by mouth every 6 (six) hours as needed.     Alcohol Swabs (DROPSAFE ALCOHOL PREP) 70 % PADS USE TO CLEAN INJECTION SITE TO CHECK BLOOD SUGARS DAILY 100 each 3   amLODipine (NORVASC) 5 MG tablet TAKE 1 TABLET(5 MG) BY MOUTH DAILY 90 tablet 3   aspirin 81 MG tablet Take 81 mg by mouth daily.     Cholecalciferol (VITAMIN D3) 50 MCG (2000 UT) capsule TAKE 1 CAPSULE BY MOUTH EVERY DAY 100 capsule 3   diclofenac Sodium (VOLTAREN) 1 % GEL Apply 2 g topically 4 (four) times daily. 150 g 1   furosemide (LASIX) 20 MG tablet TAKE 1 TABLET(20 MG) BY MOUTH DAILY AS NEEDED FOR SWELLING 90 tablet 3   glucose blood (ACCU-CHEK AVIVA PLUS) test strip USE TO CHECK BLOOD SUGARS DAILY 100 strip 3   hydroxypropyl methylcellulose (ISOPTO TEARS) 2.5 % ophthalmic solution Place 1 drop into both eyes 2 (two) times daily.     isosorbide mononitrate (IMDUR) 30 MG 24 hr tablet TAKE 1 TABLET BY MOUTH EVERY DAY 90 tablet 0   metoprolol succinate (TOPROL-XL) 50 MG 24 hr tablet TAKE 1 AND 1/2 TABLETS BY MOUTH TWICE DAILY 270 tablet 3   pantoprazole (PROTONIX) 40 MG tablet TAKE 1 TABLET BY MOUTH EVERY DAY 90 tablet 3   simvastatin (ZOCOR) 20 MG tablet TAKE 1 TABLET BY MOUTH AT BEDTIME 90 tablet 1   traMADol (ULTRAM) 50 MG tablet Take 1 tablet (50 mg total) by mouth 3 (three) times daily as needed. 90 tablet 2   warfarin (COUMADIN) 5 MG tablet TAKE 1/2 TO 1 TABLET BY MOUTH DAILY AS DIRECTED BY COUMADIN CLINIC 90 tablet 0   ALPRAZolam (XANAX) 0.5 MG tablet TAKE 1/2 TO 1 TABLET BY MOUTH THREE TIMES DAILY AS NEEDED FOR ANXIETY. DO NOT TAKE WITH  TRAMADOL.THIS IS A 30 DAY SUPPLY 90 tablet 2   hyoscyamine (LEVSIN) 0.125 MG tablet Take 1 tablet (0.125 mg total) by mouth every 6 (six) hours as needed for up to 10 days for cramping. 100 tablet 3   No facility-administered medications prior to visit.    ROS: Review of Systems  Constitutional:  Negative for activity change, appetite change, chills, fatigue and unexpected weight change.  HENT:  Negative for congestion, mouth sores and sinus pressure.   Eyes:  Negative for visual disturbance.  Respiratory:  Negative for cough and chest tightness.   Gastrointestinal:  Negative for abdominal pain and nausea.  Genitourinary:  Negative for difficulty urinating, frequency and vaginal pain.  Musculoskeletal:  Negative for back pain and gait problem.  Skin:  Negative for pallor and rash.  Neurological:  Negative for dizziness, tremors, weakness, light-headedness, numbness and headaches.  Psychiatric/Behavioral:  Negative for confusion, decreased concentration, sleep disturbance and suicidal ideas. The patient is nervous/anxious.     Objective:  BP (!) 122/58 (BP Location: Left Arm)   Pulse 60   Temp 98 F (36.7 C) (Oral)   Ht '5\' 6"'$  (1.676 m)   Wt 191 lb 3.2 oz (86.7 kg)   SpO2 97%   BMI 30.86 kg/m  BP Readings from Last 3 Encounters:  12/10/21 (!) 122/58  08/07/21 136/80  05/08/21 132/64    Wt Readings from Last 3 Encounters:  12/10/21 191 lb 3.2 oz (86.7 kg)  08/07/21 188 lb (85.3 kg)  05/08/21 189 lb (85.7 kg)    Physical Exam Constitutional:      General: She is not in acute distress.    Appearance: She is well-developed. She is obese.  HENT:     Head: Normocephalic.     Right Ear: External ear normal.     Left Ear: External ear normal.     Nose: Nose normal.  Eyes:     General:        Right eye: No discharge.        Left eye: No discharge.     Conjunctiva/sclera: Conjunctivae normal.     Pupils: Pupils are equal, round, and reactive to light.  Neck:      Thyroid: No thyromegaly.     Vascular: No JVD.     Trachea: No tracheal deviation.  Cardiovascular:     Rate and Rhythm: Normal rate. Rhythm irregular.     Heart sounds: Normal heart sounds.  Pulmonary:     Effort: No respiratory distress.     Breath sounds: No stridor. No wheezing.  Abdominal:     General: Bowel sounds are normal. There is no distension.     Palpations: Abdomen is soft. There is no mass.     Tenderness: There is no abdominal tenderness. There is no guarding or rebound.  Musculoskeletal:        General: No tenderness.     Cervical back: Normal range of motion and neck supple. No rigidity.     Right lower leg: Edema present.     Left lower leg: Edema present.  Lymphadenopathy:     Cervical: No cervical adenopathy.  Skin:    Findings: No erythema or rash.  Neurological:     Cranial Nerves: No cranial nerve deficit.     Motor: No abnormal muscle tone.     Coordination: Coordination normal.     Deep Tendon Reflexes: Reflexes normal.  Psychiatric:        Behavior: Behavior normal.        Thought Content: Thought content normal.        Judgment: Judgment normal.   Trace edema - B ankles  Lab Results  Component Value Date   WBC 4.5 02/05/2021   HGB 12.3 02/05/2021   HCT 37.6 02/05/2021   PLT 201.0 02/05/2021   GLUCOSE 164 (H) 08/07/2021   CHOL 168 10/29/2020   TRIG 82.0 10/29/2020   HDL 66.40 10/29/2020   LDLDIRECT 105.5 11/26/2010   LDLCALC 85 10/29/2020   ALT 10 08/07/2021   AST 16 08/07/2021   NA 138 08/07/2021   K 4.2 08/07/2021   CL 104 08/07/2021   CREATININE 1.22 (H) 08/07/2021   BUN 15 08/07/2021   CO2 29 08/07/2021   TSH 0.87 08/07/2021   INR 2.2 11/26/2021   HGBA1C 7.3 (H) 08/07/2021    MR ABDOMEN WWO CONTRAST  Result Date: 03/30/2021 CLINICAL DATA:  Follow-up renal mass EXAM: MRI ABDOMEN WITHOUT AND WITH CONTRAST TECHNIQUE: Multiplanar multisequence MR imaging of the abdomen was performed both before and after the administration of  intravenous contrast. CONTRAST:  54m MULTIHANCE GADOBENATE DIMEGLUMINE 529 MG/ML IV SOLN COMPARISON:  MRI abdomen 09/01/2019 FINDINGS: Study is limited due to motion. Lower chest: Not well visualized. Hepatobiliary: Liver is normal in size  and contour. No evidence of hepatic steatosis. No suspicious hepatic mass visualized. A few tiny hyperintense T2 signal likely cysts. Gallbladder contains layering hypointense T2 signal material likely sludge and stones. No gallbladder wall thickening or pericholecystic edema. No biliary ductal dilatation identified. Pancreas: Mildly atrophic with no suspicious mass or ductal dilatation. No acute inflammatory changes visualized. Spleen:  Within normal limits in size and appearance. Adrenals/Urinary Tract: Stable 2.6 cm right adrenal gland adenoma. Left adrenal gland appears normal. Stable size and appearance of a partially exophytic heterogeneously enhancing cystic and solid mass at the posterior left kidney measuring 1.1 cm. No additional suspicious renal mass identified bilaterally. Multiple renal cortical cysts are again seen bilaterally measuring up to 3.6 cm on the right. No hydronephrosis. Stomach/Bowel: Visualized portions within the abdomen are unremarkable. Vascular/Lymphatic: No pathologically enlarged lymph nodes identified. No abdominal aortic aneurysm demonstrated. Other:  No ascites. Musculoskeletal: No suspicious bone lesions identified. IMPRESSION: 1. Stable size and appearance of the heterogeneously enhancing mixed cystic and solid mass at the posterior left kidney measuring 1.1 cm. No new mass or lymphadenopathy identified. 2. Stable right adrenal gland adenoma. 3. Multiple renal cortical cysts. Electronically Signed   By: Ofilia Neas M.D.   On: 03/30/2021 15:23    Assessment & Plan:   Problem List Items Addressed This Visit     DM2 (diabetes mellitus, type 2) ([place])    Mild  Diet controlled Check A1c      Relevant Orders   Comprehensive  metabolic panel   Hemoglobin A1c   Edema    Mild. Use compression socks Cont w/Furosemide prn      Essential hypertension    Chronic Cont on Metoprolol, Norvasc      PAF (paroxysmal atrial fibrillation) ([facility])    On Coumadin, Toprol XL CHA2DS2VASc = 4-->coumadin.  Potential benefits of a long term Coumadin use as well as potential risks  and complications were explained to the patient and were aknowledged.      Relevant Orders   Comprehensive metabolic panel   Hemoglobin A1c      Meds ordered this encounter  Medications   ALPRAZolam (XANAX) 0.5 MG tablet    Sig: TAKE 1/2 TO 1 TABLET BY MOUTH THREE TIMES DAILY AS NEEDED FOR ANXIETY. DO NOT TAKE WITH TRAMADOL.THIS IS A 30 DAY SUPPLY    Dispense:  90 tablet    Refill:  3      Follow-up: Return in about 3 months (around 03/12/2022).  Walker Kehr, MD

## 2021-12-10 NOTE — Assessment & Plan Note (Signed)
Chronic Cont on Metoprolol, Norvasc 

## 2021-12-10 NOTE — Assessment & Plan Note (Addendum)
On Coumadin, Toprol XL CHA2DS2VASc = 4-->coumadin.  Potential benefits of a long term Coumadin use as well as potential risks  and complications were explained to the patient and were aknowledged. Pt agreed to get Prevnar 20

## 2021-12-10 NOTE — Assessment & Plan Note (Signed)
Mild  Diet controlled Check A1c

## 2021-12-10 NOTE — Addendum Note (Signed)
Addended by: Earnstine Regal on: 12/10/2021 11:44 AM   Modules accepted: Orders

## 2021-12-10 NOTE — Assessment & Plan Note (Signed)
Mild. Use compression socks Cont w/Furosemide prn

## 2021-12-13 ENCOUNTER — Other Ambulatory Visit: Payer: Self-pay | Admitting: Internal Medicine

## 2021-12-13 DIAGNOSIS — Z1231 Encounter for screening mammogram for malignant neoplasm of breast: Secondary | ICD-10-CM

## 2021-12-19 ENCOUNTER — Other Ambulatory Visit: Payer: Self-pay | Admitting: Internal Medicine

## 2021-12-20 ENCOUNTER — Encounter: Payer: Self-pay | Admitting: Family Medicine

## 2021-12-20 ENCOUNTER — Ambulatory Visit (INDEPENDENT_AMBULATORY_CARE_PROVIDER_SITE_OTHER): Payer: Medicare HMO

## 2021-12-20 ENCOUNTER — Ambulatory Visit (INDEPENDENT_AMBULATORY_CARE_PROVIDER_SITE_OTHER): Payer: Medicare HMO | Admitting: Family Medicine

## 2021-12-20 VITALS — BP 140/72 | HR 65 | Temp 97.6°F | Ht 66.0 in | Wt 188.0 lb

## 2021-12-20 DIAGNOSIS — M25562 Pain in left knee: Secondary | ICD-10-CM

## 2021-12-20 DIAGNOSIS — M25462 Effusion, left knee: Secondary | ICD-10-CM | POA: Diagnosis not present

## 2021-12-20 NOTE — Patient Instructions (Signed)
Please go downstairs for an X ray of your knee.   Continue taking Tylenol arthritis as needed.  Elevate your knee and use ice as needed for pain and swelling.   I placed a referral to your orthopedist (Dr. Erlinda Hong at [facility]) and they will call you to schedule a visit.     Acute Knee Pain, Adult Acute knee pain is sudden and may be caused by damage, swelling, or irritation of the muscles and tissues that support the knee. Pain may result from: A fall. An injury to the knee from twisting motions. A hit to the knee. Infection. Acute knee pain may go away on its own with time and rest. If it does not, your health care provider may order tests to find the cause of the pain. These may include: Imaging tests, such as an X-ray, MRI, CT scan, or ultrasound. Joint aspiration. In this test, fluid is removed from the knee and evaluated. Arthroscopy. In this test, a lighted tube is inserted into the knee and an image is projected onto a TV screen. Biopsy. In this test, a sample of tissue is removed from the body and studied under a microscope. Follow these instructions at home: If you have a knee sleeve or brace:  Wear the knee sleeve or brace as told by your health care provider. Remove it only as told by your health care provider. Loosen it if your toes tingle, become numb, or turn cold and blue. Keep it clean. If the knee sleeve or brace is not waterproof: Do not let it get wet. Cover it with a watertight covering when you take a bath or shower. Activity Rest your knee. Do not do things that cause pain or make pain worse. Avoid high-impact activities or exercises, such as running, jumping rope, or doing jumping jacks. Work with a physical therapist to make a safe exercise program, as recommended by your health care provider. Do exercises as told by your physical therapist. Managing pain, stiffness, and swelling  If directed, put ice on the affected knee. To do this: If you have a removable  knee sleeve or brace, remove it as told by your health care provider. Put ice in a plastic bag. Place a towel between your skin and the bag. Leave the ice on for 20 minutes, 2-3 times a day. Remove the ice if your skin turns bright red. This is very important. If you cannot feel pain, heat, or cold, you have a greater risk of damage to the area. If directed, use an elastic bandage to put pressure (compression) on your injured knee. This may control swelling, give support, and help with discomfort. Raise (elevate) your knee above the level of your heart while you are sitting or lying down. Sleep with a pillow under your knee. General instructions Take over-the-counter and prescription medicines only as told by your health care provider. Do not use any products that contain nicotine or tobacco, such as cigarettes, e-cigarettes, and chewing tobacco. If you need help quitting, ask your health care provider. If you are overweight, work with your health care provider and a dietitian to set a weight-loss goal that is healthy and reasonable for you. Extra weight can put pressure on your knee. Pay attention to any changes in your symptoms. Keep all follow-up visits. This is important. Contact a health care provider if: Your knee pain continues, changes, or gets worse. You have a fever along with knee pain. Your knee feels warm to the touch or is red.  Your knee buckles or locks up. Get help right away if: Your knee swells, and the swelling becomes worse. You cannot move your knee. You have severe pain in your knee that cannot be managed with pain medicine. Summary Acute knee pain can be caused by a fall, an injury, an infection, or damage, swelling, or irritation of the tissues that support your knee. Your health care provider may perform tests to find out the cause of the pain. Pay attention to any changes in your symptoms. Relieve your pain with rest, medicines, light activity, and the use of  ice. Get help right away if your knee swells, you cannot move your knee, or you have severe pain that cannot be managed with medicine. This information is not intended to replace advice given to you by your health care provider. Make sure you discuss any questions you have with your health care provider. Document Revised: 09/14/2019 Document Reviewed: 09/14/2019 Elsevier Patient Education  [employer].

## 2021-12-20 NOTE — Progress Notes (Unsigned)
Subjective:     Patient ID: [name], female    DOB: 07/13/1934, 87 y.o.   MRN: [id number]  Chief Complaint  Patient presents with   Joint Swelling    Left knee swollen noticed Monday and painful. Reports a lot of the swelling has gone down but it is still painful. Took tylenol arthritis to help, rates pain 6/10 now.    HPI Patient is in today for a 5 day history of left knee swelling and pain. NKI. No locking or giving away. No numbness, tingling or weakness.  Recalls having pain in her left knee in the past. No surgeries of keee  Taking Tylenol arthritis and it has helped. She is also elevating and icing her knee.   No fever, chills, N/V/D. No other swollen or painful joints at this time.   Health Maintenance Due  Topic Date Due   Diabetic kidney evaluation - Urine ACR  Never done   DEXA SCAN  Never done   FOOT EXAM  10/30/2018    Past Medical History:  Diagnosis Date   Anxiety state, unspecified    Bronchitis, not specified as acute or chronic    Diaphragmatic hernia without mention of obstruction or gangrene    Dyslipidemia    Essential hypertension    Irritable bowel syndrome    Mitral valve disorders(424.0)    a. 08/2010 Echo: EF >55%, mild MR, mod TR, trace AI.   Non-obstructive CAD    a. 07/2010 Lexiscan MV: no ischemia, EF 78%; b. 07/2010 Cath: LM nl, LAD50/60p, 40 apical, LCX nl, RCA dominant, 30/40p, 47m   Osteoarthrosis, unspecified whether generalized or localized, unspecified site    Osteoporosis, unspecified    Other abnormal glucose    PAF (paroxysmal atrial fibrillation) (HCC)    a. CHA2DS2VASc = 4-->coumadin.   Phlebitis and thrombophlebitis of superficial vessels of lower extremities    [place]VT ([place]) [place]    a. 05/2012 Holter: short bursts of PSVT noted-->Managed with toprol.   Unspecified venous (peripheral) insufficiency    Unspecified vitamin D deficiency     Past Surgical History:  Procedure Laterality  Date   CARDIAC CATHETERIZATION  07/30/2010   nonobstructive CAD, 20-40% RCA stenosis, 50-60% eccentric LAD stenosis more prox, 40% LAD stenosis more distal, EF 65%   TRANSTHORACIC ECHOCARDIOGRAM  09/03/2010   EF=>55%; mild MR; mod TR; AV mildly sclerotic with trace regurg; mild pulm valve regurg   VESICOVAGINAL FISTULA CLOSURE W/ TAH      Family History  Problem Relation Age of Onset   Stroke Mother    Stroke Father        also heart disease   Clotting disorder Paternal Grandmother        blood clot   Stroke Brother        also heart disease    Social History   Socioeconomic History   Marital status: Widowed    Spouse name: Not on file   Number of children: 4   Years of education: Not on file   Highest education level: Not on file  Occupational History   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  Other Topics Concern   Not on file  Social History Narrative   Not  on file   Social Determinants of Health   Financial Resource Strain: Low Risk  (01/04/2021)   Overall Financial Resource Strain (CARDIA)    Difficulty of Paying Living Expenses: Not very hard  Food Insecurity: Food Insecurity Present (11/29/2019)   Hunger Vital Sign    Worried About Running Out of Food in the Last Year: Often true    Ran Out of Food in the Last Year: Often true  Transportation Needs: No Transportation Needs (11/29/2019)   PRAPARE - Hydrologist (Medical): No    Lack of Transportation (Non-Medical): No  Physical Activity: Insufficiently Active (11/29/2019)   Exercise Vital Sign    Days of Exercise per Week: 3 days    Minutes of Exercise per Session: 30 min  Stress: No Stress Concern Present (11/29/2019)   [place]    Feeling of Stress : Not at all  Social Connections: Moderately Integrated (10/29/2017)   Social Connection and Isolation Panel [NHANES]    Frequency of Communication with Friends and Family: More than three times a week    Frequency of Social Gatherings with Friends and Family: More than three times a week    Attends Religious Services: More than 4 times per year    Active Member of [employer] or Organizations: Yes    Attends Archivist Meetings: More than 4 times per year    Marital Status: Widowed  Intimate Partner Violence: Not on file    Outpatient Medications Prior to Visit  Medication Sig Dispense Refill   Accu-Chek Softclix Lancets lancets USE AS INSTRUCTED 100 each 3   acetaminophen (TYLENOL) 500 MG tablet Take 500 mg by mouth every 6 (six) hours as needed.     Alcohol Swabs (DROPSAFE ALCOHOL PREP) 70 % PADS USE TO CLEAN INJECTION SITE TO CHECK BLOOD SUGARS DAILY 100 each 3   ALPRAZolam (XANAX) 0.5 MG tablet TAKE 1/2 TO 1 TABLET BY MOUTH THREE TIMES DAILY AS NEEDED FOR ANXIETY. DO NOT TAKE WITH TRAMADOL.THIS IS A 30 DAY SUPPLY 90 tablet 3   amLODipine (NORVASC) 5 MG tablet TAKE 1 TABLET(5 MG) BY MOUTH DAILY 90 tablet 3   aspirin 81 MG tablet Take 81 mg by mouth daily.     Cholecalciferol (VITAMIN D3) 50 MCG (2000 UT) capsule TAKE 1 CAPSULE BY MOUTH EVERY DAY 100 capsule 3   diclofenac Sodium (VOLTAREN) 1 % GEL Apply 2 g topically 4 (four) times daily. 150 g 1   furosemide (LASIX) 20 MG tablet TAKE 1 TABLET(20 MG) BY MOUTH DAILY AS NEEDED FOR SWELLING 90 tablet 3   glucose blood (ACCU-CHEK AVIVA PLUS) test strip USE TO CHECK BLOOD SUGARS DAILY 100 strip 3   hydroxypropyl methylcellulose (ISOPTO TEARS) 2.5 % ophthalmic solution Place 1 drop into both eyes 2 (two) times daily.     hyoscyamine (LEVSIN) 0.125 MG tablet Take 1 tablet (0.125 mg total) by mouth every 6 (six) hours as needed for up to 10 days for cramping. 100 tablet 3   isosorbide mononitrate (IMDUR)  30 MG 24 hr tablet TAKE 1 TABLET BY MOUTH EVERY DAY 90 tablet 3   metoprolol succinate (TOPROL-XL) 50 MG 24 hr tablet TAKE 1 AND 1/2 TABLETS BY MOUTH TWICE DAILY 270 tablet 3   pantoprazole (PROTONIX) 40 MG tablet TAKE 1 TABLET BY MOUTH EVERY DAY 90 tablet 3   simvastatin (ZOCOR) 20 MG tablet TAKE 1 TABLET BY MOUTH AT BEDTIME 90 tablet 1  traMADol (ULTRAM) 50 MG tablet Take 1 tablet (50 mg total) by mouth 3 (three) times daily as needed. 90 tablet 2   warfarin (COUMADIN) 5 MG tablet TAKE 1/2 TO 1 TABLET BY MOUTH DAILY AS DIRECTED BY COUMADIN CLINIC 90 tablet 0   No facility-administered medications prior to visit.    Allergies  Allergen Reactions   Clarithromycin     REACTION: unspecified   Fosamax [Alendronate Sodium]     achy   Guaifenesin Er     itching   Metformin     REACTION: pt states INTOL to Metformin "felt drained"   Prandin [Repaglinide]     Prandin dropped CBGs to 66 once - she stopped it.   Tape     Adhesive tape---rash    ROS     Objective:    Physical Exam Constitutional:      General: She is not in acute distress.    Appearance: She is not ill-appearing.  Cardiovascular:     Rate and Rhythm: Normal rate.  Pulmonary:     Effort: Pulmonary effort is normal.  Musculoskeletal:     Left knee: Swelling present. Normal range of motion. Tenderness present over the medial joint line. Normal pulse.     Right lower leg: No swelling. No edema.     Left lower leg: No swelling. No edema.     Right foot: Normal capillary refill. No swelling. Normal pulse.     Left foot: Normal capillary refill. No swelling. Normal pulse.  Skin:    General: Skin is warm and dry.     Findings: No erythema.  Neurological:     General: No focal deficit present.     Mental Status: She is alert.     Cranial Nerves: No cranial nerve deficit.     Sensory: No sensory deficit.     Motor: No weakness.     Coordination: Coordination normal.  Psychiatric:        Mood and Affect: Mood  normal.        Behavior: Behavior normal.     BP (!) 140/72 (BP Location: Left Arm, Patient Position: Sitting, Cuff Size: Large)   Pulse 65   Temp 97.6 F (36.4 C) (Temporal)   Ht '5\' 6"'$  (1.676 m)   Wt 188 lb (85.3 kg)   SpO2 95%   BMI 30.34 kg/m  Wt Readings from Last 3 Encounters:  12/20/21 188 lb (85.3 kg)  12/10/21 191 lb 3.2 oz (86.7 kg)  08/07/21 188 lb (85.3 kg)       Assessment & Plan:   Problem List Items Addressed This Visit   None Visit Diagnoses     Pain and swelling of left knee    -  Primary   Relevant Orders   Ambulatory referral to Orthopedic Surgery   DG Knee Complete 4 Views Left      XR ordered. Continue with RICE, Tylenol and will refer back to orthopedist, Dr. Erlinda Hong.   I am having Elsie Ra. Megan Salon maintain her aspirin, hydroxypropyl methylcellulose / hypromellose, diclofenac Sodium, traMADol, acetaminophen, hyoscyamine, pantoprazole, amLODipine, metoprolol succinate, simvastatin, Vitamin D3, DropSafe Alcohol Prep, Accu-Chek Aviva Plus, Accu-Chek Softclix Lancets, warfarin, furosemide, ALPRAZolam, and isosorbide mononitrate.  No orders of the defined types were placed in this encounter.

## 2021-12-23 ENCOUNTER — Telehealth: Payer: Medicare HMO

## 2021-12-25 ENCOUNTER — Encounter: Payer: Self-pay | Admitting: Orthopaedic Surgery

## 2021-12-25 ENCOUNTER — Ambulatory Visit: Payer: Medicare HMO | Admitting: Orthopaedic Surgery

## 2021-12-25 DIAGNOSIS — M1712 Unilateral primary osteoarthritis, left knee: Secondary | ICD-10-CM

## 2021-12-25 MED ORDER — TRAMADOL HCL 50 MG PO TABS: 50.0000 mg | ORAL_TABLET | Freq: Two times a day (BID) | ORAL | 2 refills | Status: DC | PRN

## 2021-12-25 MED ORDER — BUPIVACAINE HCL 0.5 % IJ SOLN
2.0000 mL | INTRAMUSCULAR | Status: AC | PRN
  Administered 2021-12-25: 2 mL via INTRA_ARTICULAR

## 2021-12-25 MED ORDER — METHYLPREDNISOLONE ACETATE 40 MG/ML IJ SUSP
40.0000 mg | INTRAMUSCULAR | Status: AC | PRN
  Administered 2021-12-25: 40 mg via INTRA_ARTICULAR

## 2021-12-25 MED ORDER — LIDOCAINE HCL 1 % IJ SOLN
2.0000 mL | INTRAMUSCULAR | Status: AC | PRN
  Administered 2021-12-25: 2 mL

## 2021-12-25 NOTE — Progress Notes (Signed)
Office Visit Note   Patient: [name]           Date of Birth: [birth year]           MRN: [id number] Visit Date: 12/25/2021              Requested by: Girtha Rm, NP-C [address] [place],  [place] [address] PCP: Plotnikov, Evie Lacks, MD   Assessment & Plan: Visit Diagnoses:  1. Primary osteoarthritis of left knee     Plan: Impression is left knee arthritis flareup.  Today, we discussed various treatment options to include cortisone injection.  She would like to proceed with this.  Also agreed to call in tramadol to take as needed.  Follow-up with [place] as needed.  Follow-Up Instructions: Return if symptoms worsen or fail to improve.   Orders:  Orders Placed This Encounter  Procedures   Large Joint Inj: L knee   Meds ordered this encounter  Medications   traMADol (ULTRAM) 50 MG tablet    Sig: Take 1 tablet (50 mg total) by mouth every 12 (twelve) hours as needed.    Dispense:  30 tablet    Refill:  2      Procedures: Large Joint Inj: L knee on 12/25/2021 7:41 PM Details: 22 G needle Medications: 2 mL bupivacaine 0.5 %; 2 mL lidocaine 1 %; 40 mg methylPREDNISolone acetate 40 MG/ML Outcome: tolerated well, no immediate complications Patient was prepped and draped in the usual sterile fashion.       Clinical Data: No additional findings.   Subjective: Chief Complaint  Patient presents with   Left Knee - Pain    HPI patient is a pleasant 87 year old female who comes in today with left knee pain for the past week.  She denies any injury or change in activity.  She does note that her symptoms have gradually improved.  The pain persist to the medial knee worse with walking as well as increased activity.  She has been using ice and taking Tylenol arthritis with some relief.  No previous cortisone injections to the left knee.  Review of Systems as detailed in HPI.  All others reviewed and are negative.   Objective: Vital Signs: There were no vitals  taken for this visit.  Physical Exam well-developed well-nourished female in no acute distress.  Alert and oriented x3.  Ortho Exam left knee exam reveals a small effusion.  Range of motion 0 to 100 degrees.  Medial joint line tenderness.  She is neurovascular tact distally.  Specialty Comments:  No specialty comments available.  Imaging: No new imaging   PMFS History: Patient Active Problem List   Diagnosis Date Noted   Primary osteoarthritis of left knee 12/25/2021   Atherosclerosis of aorta ([place]) 10/25/2020   Arthralgia 10/25/2020   Hip pain, acute, left 10/01/2020   Fall 10/01/2020   Wrist contusion 10/01/2020   Hypokalemia 07/12/2020   Gastroenteritis 04/27/2020   Dehydration 04/27/2020   Urinary frequency 02/28/2020   Peroneal tendinitis 11/01/2019   Spontaneous rupture of extensor tendon of left foot 10/28/2019   Contusion of left foot 10/27/2019   Sinusitis 04/13/2019   Rash 12/05/2016   Dyspnea 04/24/2016   Tricuspid valve insufficiency 04/24/2016   Bilateral impacted cerumen 01/14/2016   Conductive hearing loss in left ear 01/14/2016   Cerumen impaction 11/19/2015   Ear discomfort 11/13/2015   Grief at loss of child 09/14/2015   Well adult exam 09/14/2015   Dyslipidemia  PSVT (paroxysmal supraventricular tachycardia) (HCC)    PAF (paroxysmal atrial fibrillation) ([place])    Viral gastroenteritis 07/21/2015   Acute upper respiratory infection 04/17/2015   Edema 11/08/2014   Chronic venous insufficiency 11/08/2014   Diarrhea 05/01/2014   Mass of left thigh 12/14/2013   Microhematuria 12/14/2013   Long term (current) use of anticoagulants 08/19/2013   CAD (coronary artery disease) 05/21/2011   Atrial tachycardia ([place]) 11/19/2010   CHEST PAIN 08/07/2009   Vitamin D deficiency 04/19/2008   SUPERFICIAL THROMBOPHLEBITIS 10/20/2007   DM2 (diabetes mellitus, type 2) ([place]) 03/16/2007   HYPERCHOLESTEROLEMIA 03/15/2007   Anxiety 03/15/2007   Essential  hypertension 03/15/2007   Mitral valve disorder 03/15/2007   BRONCHITIS 03/15/2007   HIATAL HERNIA 03/15/2007   Irritable bowel syndrome 03/15/2007   Osteoarthritis 03/15/2007   Osteoporosis 03/15/2007   Past Medical History:  Diagnosis Date   Anxiety state, unspecified    Bronchitis, not specified as acute or chronic    Diaphragmatic hernia without mention of obstruction or gangrene    Dyslipidemia    Essential hypertension    Irritable bowel syndrome    Mitral valve disorders(424.0)    a. 08/2010 Echo: EF >55%, mild MR, mod TR, trace AI.   Non-obstructive CAD    a. 07/2010 Lexiscan MV: no ischemia, EF 78%; b. 07/2010 Cath: LM nl, LAD50/60p, 40 apical, LCX nl, RCA dominant, 30/40p, 75m   Osteoarthrosis, unspecified whether generalized or localized, unspecified site    Osteoporosis, unspecified    Other abnormal glucose    PAF (paroxysmal atrial fibrillation) (HCC)    a. CHA2DS2VASc = 4-->coumadin.   Phlebitis and thrombophlebitis of superficial vessels of lower extremities    PSVT (paroxysmal [place]ventricular tachycardia) ([place]    a. 05/2012 Holter: short bursts of PSVT noted-->Managed with toprol.   Unspecified venous (peripheral) insufficiency    Unspecified vitamin D deficiency     Family History  Problem Relation Age of Onset   Stroke Mother    Stroke Father        also heart disease   Clotting disorder Paternal Grandmother        blood clot   Stroke Brother        also heart disease    Past Surgical History:  Procedure Laterality Date   CARDIAC CATHETERIZATION  07/30/2010   nonobstructive CAD, 20-40% RCA stenosis, 50-60% eccentric LAD stenosis more prox, 40% LAD stenosis more distal, EF 65%   TRANSTHORACIC ECHOCARDIOGRAM  09/03/2010   EF=>55%; mild MR; mod TR; AV mildly sclerotic with trace regurg; mild pulm valve regurg   VESICOVAGINAL FISTULA CLOSURE W/ TAH     Social History   Occupational History   Occupation: retired    EFish farm manager RETIRED  Tobacco Use    Smoking status: Never   Smokeless tobacco: Never  Vaping Use   Vaping Use: Never used  Substance and Sexual Activity   Alcohol use: No   Drug use: No   Sexual activity: Not Currently

## 2022-01-07 ENCOUNTER — Ambulatory Visit: Payer: Medicare HMO | Attending: Internal Medicine | Admitting: *Deleted

## 2022-01-07 DIAGNOSIS — Z7901 Long term (current) use of anticoagulants: Secondary | ICD-10-CM | POA: Diagnosis not present

## 2022-01-07 DIAGNOSIS — I480 Paroxysmal atrial fibrillation: Secondary | ICD-10-CM

## 2022-01-07 LAB — POCT INR: INR: 2.9 (ref 2.0–3.0)

## 2022-01-07 NOTE — Patient Instructions (Addendum)
Description   Continue taking Warfarin 1 tablet daily.  Repeat INR in 6 weeks.  *Consistent amount of greens every week*

## 2022-01-17 ENCOUNTER — Ambulatory Visit
Admission: RE | Admit: 2022-01-17 | Discharge: 2022-01-17 | Disposition: A | Discharge status: Home / Self Care | Payer: Medicare HMO | Source: Ambulatory Visit | Attending: Internal Medicine | Admitting: Internal Medicine

## 2022-01-17 DIAGNOSIS — Z1231 Encounter for screening mammogram for malignant neoplasm of breast: Secondary | ICD-10-CM

## 2022-01-30 ENCOUNTER — Other Ambulatory Visit: Payer: Self-pay | Admitting: Internal Medicine

## 2022-02-18 ENCOUNTER — Ambulatory Visit: Payer: Medicare HMO | Admitting: Physician Assistant

## 2022-02-18 ENCOUNTER — Ambulatory Visit: Payer: Medicare HMO | Attending: Internal Medicine

## 2022-02-18 ENCOUNTER — Encounter: Payer: Self-pay | Admitting: Physician Assistant

## 2022-02-18 VITALS — BP 132/68 | HR 62 | Ht 66.0 in | Wt 188.0 lb

## 2022-02-18 DIAGNOSIS — I480 Paroxysmal atrial fibrillation: Secondary | ICD-10-CM | POA: Diagnosis not present

## 2022-02-18 DIAGNOSIS — E119 Type 2 diabetes mellitus without complications: Secondary | ICD-10-CM | POA: Diagnosis not present

## 2022-02-18 DIAGNOSIS — E785 Hyperlipidemia, unspecified: Secondary | ICD-10-CM

## 2022-02-18 DIAGNOSIS — Z7901 Long term (current) use of anticoagulants: Secondary | ICD-10-CM | POA: Diagnosis not present

## 2022-02-18 DIAGNOSIS — I447 Left bundle-branch block, unspecified: Secondary | ICD-10-CM | POA: Diagnosis not present

## 2022-02-18 DIAGNOSIS — I471 Supraventricular tachycardia, unspecified: Secondary | ICD-10-CM

## 2022-02-18 DIAGNOSIS — I2510 Atherosclerotic heart disease of native coronary artery without angina pectoris: Secondary | ICD-10-CM

## 2022-02-18 DIAGNOSIS — I10 Essential (primary) hypertension: Secondary | ICD-10-CM | POA: Diagnosis not present

## 2022-02-18 LAB — POCT INR: INR: 2.3 (ref 2.0–3.0)

## 2022-02-18 MED ORDER — METOPROLOL SUCCINATE ER 50 MG PO TB24: ORAL_TABLET | ORAL | 3 refills | Status: DC

## 2022-02-18 NOTE — Patient Instructions (Signed)
Continue taking Warfarin 1 tablet daily.  Repeat INR in 6 weeks.  *Consistent amount of greens every week*

## 2022-02-18 NOTE — Patient Instructions (Signed)
Medication Instructions:   Your physician recommends that you continue on your current medications as directed. Please refer to the Current Medication list given to you today.  *If you need a refill on your cardiac medications before your next appointment, please call your pharmacy*  Lab Work: Your physician recommends that you return for lab work:  Fasting Lipid Panel-DO NOT eat or drink past midnight   If you have labs (blood work) drawn today and your tests are completely normal, you will receive your results only by: [place] (if you have MyChart) OR A paper copy in the mail If you have any lab test that is abnormal or we need to change your treatment, we will call you to review the results.  Testing/Procedures: Your physician has requested that you have an echocardiogram. Echocardiography is a painless test that uses sound waves to create images of your heart. It provides your doctor with information about the size and shape of your heart and how well your heart's chambers and valves are working. This procedure takes approximately one hour. There are no restrictions for this procedure. Please do NOT wear cologne, perfume, aftershave, or lotions (deodorant is allowed). Please arrive 15 minutes prior to your appointment time.  Follow-Up: At [facility], you and your health needs are our priority.  As part of our continuing mission to provide you with exceptional heart care, we have created designated Provider Care Teams.  These Care Teams include your primary Cardiologist (physician) and Advanced Practice Providers (APPs -  Physician Assistants and Nurse Practitioners) who all work together to provide you with the care you need, when you need it.  We recommend signing up for the patient portal called "MyChart".  Sign up information is provided on this After Visit Summary.  MyChart is used to connect with patients for Virtual Visits (Telemedicine).  Patients are able to view  lab/test results, encounter notes, upcoming appointments, etc.  Non-urgent messages can be sent to your provider as well.   To learn more about what you can do with MyChart, go to [email or web address].    Your next appointment:   1 year(s)  The format for your next appointment:   In Person  Provider:   Pixie Casino, MD     Other Instructions  Important Information About [place]

## 2022-02-18 NOTE — Progress Notes (Unsigned)
Cardiology Office Note:    Date:  02/19/2022   ID:  JAQUELINE UBER, DOB [birth year], MRN [id number]  PCP:  Cassandria Anger, MD   [place] [facility] Cardiologist:  Pixie Casino, MD     Referring MD: Cassandria Anger, MD   Chief Complaint  Patient presents with   Follow-up    Seen for Dr. Debara Pickett    History of Present Illness:    [name] is a 87 y.o. female with a hx of PAF, SVT, HTN, HLD, DM 2, venous insufficiency and history of nonobstructive CAD.  She underwent Myoview for chest pain in April 2012, this revealed EF 78%, no ischemia.  Due to persistent symptoms, she eventually underwent cardiac catheterization in April 2012 that showed normal left main, 50 to 60% LAD disease, 40% apical lesion, normal left circumflex, 30 to 40% proximal RCA lesion.  Echocardiogram at the time showed EF greater than 55%, mild aortic insufficiency and aneurysmal atrial septum.  Heart monitor obtained in 2020 showed low A. fib burden. She had Covid 19 on 05/01/2020.  She has went back to the emergency room twice on 1/22 and 05/08/2020 due to increased palpitation, EKG obtained all showed sinus rhythm.  I last the patient in January 2022 for follow-up, at which time she complained of increased palpitation.  I recommended continued observation and follow-up in 1 month to reassess the degree of palpitation, if her palpitations continue to worsen, I was going to increase the metoprolol and proceeded with repeat heart monitor.  Unfortunately, as needed extra dose of Toprol-XL did not help her symptoms.  Dr. Debara Pickett increase her Toprol-XL to 75 mg twice a day and placed her on 2-week heart monitor.  Heart monitor does not show any atrial fibrillation however it does show multiple episodes of what appears to be SVT.  Toprol-XL was further increased to 100 mg twice a day, however she was unable to tolerate this higher dose, therefore she self reduced it down to 75 mg twice a day.   Patient  presents today for follow-up.  She denies any significant chest pain or worsening dyspnea.  She has no lower extremity edema, orthopnea or PND.  She has been taking 75 mg twice a day of Toprol-XL which controls her palpitation very well.  She will occasionally require an additional 25 mg of Toprol-XL as needed for breakthrough palpitation.  However she only has breakthrough palpitation about once a month or less.  EKG obtained today shows new left bundle branch block with first-degree AV block.  She does not have any anginal symptom.  She does not do much strenuous activity either because of knee issues.  I recommended echocardiogram, if the echocardiogram is normal, we may not need to do any additional work-up.  However if the echocardiogram come back showing low ejection fraction, then the patient will need additional work-up and earlier follow-up.  Otherwise, the patient can follow-up in 1 year if the echocardiogram is normal.  Past Medical History:  Diagnosis Date   Anxiety state, unspecified    Bronchitis, not specified as acute or chronic    Diaphragmatic hernia without mention of obstruction or gangrene    Dyslipidemia    Essential hypertension    Irritable bowel syndrome    Mitral valve disorders(424.0)    a. 08/2010 Echo: EF >55%, mild MR, mod TR, trace AI.   Non-obstructive CAD    a. 07/2010 Lexiscan MV: no ischemia, EF 78%; b. 07/2010 Cath: LM  nl, LAD50/60p, 40 apical, LCX nl, RCA dominant, 30/40p, 41m   Osteoarthrosis, unspecified whether generalized or localized, unspecified site    Osteoporosis, unspecified    Other abnormal glucose    PAF (paroxysmal atrial fibrillation) (HCC)    a. CHA2DS2VASc = 4-->coumadin.   Phlebitis and thrombophlebitis of superficial vessels of lower extremities    PSVT (paroxysmal supraventricular tachycardia)    a. 05/2012 Holter: short bursts of PSVT noted-->Managed with toprol.   Unspecified venous (peripheral) insufficiency    Unspecified vitamin D  deficiency     Past Surgical History:  Procedure Laterality Date   CARDIAC CATHETERIZATION  07/30/2010   nonobstructive CAD, 20-40% RCA stenosis, 50-60% eccentric LAD stenosis more prox, 40% LAD stenosis more distal, EF 65%   TRANSTHORACIC ECHOCARDIOGRAM  09/03/2010   EF=>55%; mild MR; mod TR; AV mildly sclerotic with trace regurg; mild pulm valve regurg   VESICOVAGINAL FISTULA CLOSURE W/ TAH      Current Medications: Current Meds  Medication Sig   Accu-Chek Softclix Lancets lancets USE AS INSTRUCTED   acetaminophen (TYLENOL) 500 MG tablet Take 500 mg by mouth every 6 (six) hours as needed.   Alcohol Swabs (DROPSAFE ALCOHOL PREP) 70 % PADS USE TO CLEAN INJECTION SITE TO CHECK BLOOD SUGARS DAILY   ALPRAZolam (XANAX) 0.5 MG tablet TAKE 1/2 TO 1 TABLET BY MOUTH THREE TIMES DAILY AS NEEDED FOR ANXIETY. DO NOT TAKE WITH TRAMADOL.THIS IS A 30 DAY SUPPLY   amLODipine (NORVASC) 5 MG tablet TAKE 1 TABLET(5 MG) BY MOUTH DAILY   aspirin 81 MG tablet Take 81 mg by mouth daily.   Cholecalciferol (VITAMIN D3) 50 MCG (2000 UT) capsule TAKE 1 CAPSULE BY MOUTH EVERY DAY   diclofenac Sodium (VOLTAREN) 1 % GEL Apply 2 g topically 4 (four) times daily.   furosemide (LASIX) 20 MG tablet TAKE 1 TABLET(20 MG) BY MOUTH DAILY AS NEEDED FOR SWELLING   glucose blood (ACCU-CHEK AVIVA PLUS) test strip USE TO CHECK BLOOD SUGARS DAILY   hydroxypropyl methylcellulose (ISOPTO TEARS) 2.5 % ophthalmic solution Place 1 drop into both eyes 2 (two) times daily.   hyoscyamine (LEVSIN) 0.125 MG tablet Take 1 tablet (0.125 mg total) by mouth every 6 (six) hours as needed for up to 10 days for cramping.   isosorbide mononitrate (IMDUR) 30 MG 24 hr tablet TAKE 1 TABLET BY MOUTH EVERY DAY   pantoprazole (PROTONIX) 40 MG tablet TAKE 1 TABLET BY MOUTH EVERY DAY   simvastatin (ZOCOR) 20 MG tablet TAKE 1 TABLET BY MOUTH AT BEDTIME   traMADol (ULTRAM) 50 MG tablet Take 1 tablet (50 mg total) by mouth every 12 (twelve) hours as needed.    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  Tobacco Use   Smoking status: Never   Smokeless tobacco: Never  Vaping Use   Vaping Use: Never used  Substance and Sexual Activity   Alcohol use: No  Drug use: No   Sexual activity: Not Currently  Other Topics Concern   Not on file  Social History Narrative   Not on file   Social Determinants of Health   Financial Resource Strain: Low Risk  (01/04/2021)   Overall Financial Resource Strain (CARDIA)    Difficulty of Paying Living Expenses: Not very hard  Food Insecurity: Food Insecurity Present (11/29/2019)   Hunger Vital Sign    Worried About Running Out of Food in the Last Year: Often true    Ran Out of Food in the Last Year: Often true  Transportation Needs: No Transportation Needs (11/29/2019)   PRAPARE - Hydrologist (Medical): No    Lack of Transportation (Non-Medical): No  Physical Activity: Insufficiently Active (11/29/2019)   Exercise Vital Sign    Days of Exercise per Week: 3 days    Minutes of Exercise per Session: 30 min  Stress: No Stress Concern Present (11/29/2019)   [place]    Feeling of Stress : Not at all  Social Connections: Moderately Integrated (10/29/2017)   Social Connection and Isolation Panel [NHANES]    Frequency of Communication with Friends and Family: More than  three times a week    Frequency of Social Gatherings with Friends and Family: More than three times a week    Attends Religious Services: More than 4 times per year    Active Member of [employer] or Organizations: Yes    Attends Archivist Meetings: More than 4 times per year    Marital Status: Widowed     Family History: The patient's family history includes Clotting disorder in her paternal grandmother; Stroke in her brother, father, and mother.  ROS:   Please see the history of present illness.     All other systems reviewed and are negative.  EKGs/Labs/Other Studies Reviewed:    The following studies were reviewed today:  Echo 05/19/2016 LV EF: 60% -   65%   -------------------------------------------------------------------  Indications:     (I48.91).   -------------------------------------------------------------------  History:  PMH:  Acquired from the patient and from the patient&'s  chart. Dyspnea.  Atrial fibrillation.  Risk factors:  Hypertension. Dyslipidemia.   -------------------------------------------------------------------  Study Conclusions   - Left ventricle: The cavity size was normal. There was mild focal    basal hypertrophy of the septum. Systolic function was normal.    The estimated ejection fraction was in the range of 60% to 65%.    Wall motion was normal; there were no regional wall motion    abnormalities. Doppler parameters are consistent with abnormal    left ventricular relaxation (grade 1 diastolic dysfunction).  - Pulmonary arteries: Systolic pressure was mildly increased. PA    peak pressure: 34 mm Hg (S).   EKG:  EKG is ordered today.  The ekg ordered today demonstrates normal sinus rhythm, left bundle branch block.  Recent Labs: 08/07/2021: TSH 0.87 12/10/2021: ALT 10; BUN 19; Creatinine, Ser 1.27; Potassium 4.1; Sodium 137  Recent Lipid Panel    Component Value Date/Time   CHOL 168 10/29/2020 0941   CHOL 160 06/09/2018 1046    TRIG 82.0 10/29/2020 0941   HDL 66.40 10/29/2020 0941   HDL 72 06/09/2018 1046   CHOLHDL 3 10/29/2020 0941   VLDL 16.4 10/29/2020 0941   LDLCALC 85 10/29/2020 0941   LDLCALC 74 06/09/2018 1046   LDLDIRECT 105.5 11/26/2010 0905     Risk Assessment/Calculations:  CHA2DS2-VASc Score = 6   This indicates a 9.7% annual risk of stroke. The patient's score is based upon: CHF History: 0 HTN History: 1 Diabetes History: 1 Stroke History: 0 Vascular Disease History: 1 Age Score: 2 Gender Score: 1          Physical Exam:    VS:  BP 132/68 (BP Location: Left Arm, Patient Position: Sitting, Cuff Size: Large)   Pulse 62   Ht '5\' 6"'$  (1.676 m)   Wt 188 lb (85.3 kg)   SpO2 96%   BMI 30.34 kg/m         Wt Readings from Last 3 Encounters:  02/18/22 188 lb (85.3 kg)  12/20/21 188 lb (85.3 kg)  12/10/21 191 lb 3.2 oz (86.7 kg)     GEN:  Well nourished, well developed in no acute distress HEENT: Normal NECK: No JVD; No carotid bruits LYMPHATICS: No lymphadenopathy CARDIAC: RRR, no murmurs, rubs, gallops RESPIRATORY:  Clear to auscultation without rales, wheezing or rhonchi  ABDOMEN: Soft, non-tender, non-distended MUSCULOSKELETAL:  No edema; No deformity  SKIN: Warm and dry NEUROLOGIC:  Alert and oriented x 3 PSYCHIATRIC:  Normal affect   ASSESSMENT:    1. Coronary artery disease involving native coronary artery of native heart without angina pectoris   2. Hyperlipidemia LDL goal <70   3. LBBB (left bundle branch block)   4. PAF (paroxysmal atrial fibrillation) ([place])   5. Essential hypertension   6. Controlled type 2 diabetes mellitus without complication, without long-term current use of insulin ([place])   7. SVT (supraventricular tachycardia)    PLAN:    In order of problems listed above:  CAD: Patient denies any recent chest pain.  Continue aspirin and metoprolol  Hyperlipidemia: On Zocor  New left bundle branch block: We will proceed with echocardiogram.  I did  not order ischemic work-up given lack of chest discomfort.  We will consider additional work-up if echo cardiogram come back abnormal  PAF: On metoprolol and warfarin  Hypertension: Blood pressure stable  DM2: Managed by primary care provider  SVT: Controlled on metoprolol, still has breakthrough palpitation less than once a month.           Medication Adjustments/Labs and Tests Ordered: Current medicines are reviewed at length with the patient today.  Concerns regarding medicines are outlined above.  Orders Placed This Encounter  Procedures   Lipid panel   EKG 12-Lead   ECHOCARDIOGRAM COMPLETE   Meds ordered this encounter  Medications   metoprolol succinate (TOPROL-XL) 50 MG 24 hr tablet    Sig: TAKE 1 AND 1/2 TABLETS BY MOUTH TWICE DAILY    Dispense:  270 tablet    Refill:  3    Patient Instructions  Medication Instructions:   Your physician recommends that you continue on your current medications as directed. Please refer to the Current Medication list given to you today.  *If you need a refill on your cardiac medications before your next appointment, please call your pharmacy*  Lab Work: Your physician recommends that you return for lab work:  Fasting Lipid Panel-DO NOT eat or drink past midnight   If you have labs (blood work) drawn today and your tests are completely normal, you will receive your results only by: [place] (if you have MyChart) OR A paper copy in the mail If you have any lab test that is abnormal or we need to change your treatment, we will call you to review the results.  Testing/Procedures: Your physician  has requested that you have an echocardiogram. Echocardiography is a painless test that uses sound waves to create images of your heart. It provides your doctor with information about the size and shape of your heart and how well your heart's chambers and valves are working. This procedure takes approximately one hour. There are no  restrictions for this procedure. Please do NOT wear cologne, perfume, aftershave, or lotions (deodorant is allowed). Please arrive 15 minutes prior to your appointment time.  Follow-Up: At [facility], you and your health needs are our priority.  As part of our continuing mission to provide you with exceptional heart care, we have created designated Provider Care Teams.  These Care Teams include your primary Cardiologist (physician) and Advanced Practice Providers (APPs -  Physician Assistants and Nurse Practitioners) who all work together to provide you with the care you need, when you need it.  We recommend signing up for the patient portal called "MyChart".  Sign up information is provided on this After Visit Summary.  MyChart is used to connect with patients for Virtual Visits (Telemedicine).  Patients are able to view lab/test results, encounter notes, upcoming appointments, etc.  Non-urgent messages can be sent to your provider as well.   To learn more about what you can do with MyChart, go to [email or web address].    Your next appointment:   1 year(s)  The format for your next appointment:   In Person  Provider:   Pixie Casino, MD     Other Instructions  Important Information About Sugar        Hilbert Corrigan, [place]  02/19/2022 11:42 PM    [place]

## 2022-02-19 ENCOUNTER — Encounter: Payer: Self-pay | Admitting: Physician Assistant

## 2022-02-27 ENCOUNTER — Ambulatory Visit: Payer: Medicare HMO | Admitting: Orthopaedic Surgery

## 2022-02-27 DIAGNOSIS — M1712 Unilateral primary osteoarthritis, left knee: Secondary | ICD-10-CM

## 2022-02-27 DIAGNOSIS — E785 Hyperlipidemia, unspecified: Secondary | ICD-10-CM | POA: Diagnosis not present

## 2022-02-27 LAB — LIPID PANEL
Chol/HDL Ratio: 2.6 ratio (ref 0.0–4.4)
Cholesterol, Total: 184 mg/dL (ref 100–199)
HDL: 70 mg/dL (ref >39)
LDL Chol Calc (NIH): 97 mg/dL (ref 0–99)
Triglycerides: 92 mg/dL (ref 0–149)
VLDL Cholesterol Cal: 17 mg/dL (ref 5–40)

## 2022-02-27 NOTE — Progress Notes (Signed)
Office Visit Note   Patient: [name]           Date of Birth: Sep 06, 1934           MRN: [id number] Visit Date: 02/27/2022              Requested by: Cassandria Anger, MD [place],  [place] [address] PCP: Plotnikov, Evie Lacks, MD   Assessment & Plan: Visit Diagnoses:  1. Primary osteoarthritis of left knee     Plan: Impression is chronic left knee pain.  She did get complete but temporary relief from the cortisone injection.  Other treatment options were explained to include viscosupplementation and Voltaren gel.  Currently it is too soon for another cortisone injection.  We will go ahead and get approval for viscosupplementation.  She will pick up some Voltaren gel at the store.  We will call her to schedule an appointment to do the Visco injection.  This patient is diagnosed with osteoarthritis of the knee(s).    Radiographs show evidence of joint space narrowing, osteophytes, subchondral sclerosis and/or subchondral cysts.  This patient has knee pain which interferes with functional and activities of daily living.    This patient has experienced inadequate response, adverse effects and/or intolerance with conservative treatments such as acetaminophen, NSAIDS, topical creams, physical therapy or regular exercise, knee bracing and/or weight loss.   This patient has experienced inadequate response or has a contraindication to intra articular steroid injections for at least 3 months.   This patient is not scheduled to have a total knee replacement within 6 months of starting treatment with viscosupplementation.  Follow-Up Instructions: No follow-ups on file.   Orders:  No orders of the defined types were placed in this encounter.  No orders of the defined types were placed in this encounter.     Procedures: No procedures performed   Clinical Data: No additional findings.   Subjective: Chief Complaint  Patient presents with   Left Knee - Pain     HPI Ms. Haymaker returns today for follow-up of chronic severe left knee pain.  We saw her about 2 months ago and did a cortisone injection which did give complete relief but unfortunately this has worn off.  She is here to talk about other options.  Review of Systems   Objective: Vital Signs: There were no vitals taken for this visit.  Physical Exam  Ortho Exam Examination of the left knee is unchanged. Specialty Comments:  No specialty comments available.  Imaging: No results found.   PMFS History: Patient Active Problem List   Diagnosis Date Noted   Primary osteoarthritis of left knee 12/25/2021   Atherosclerosis of aorta ([place]) 10/25/2020   Arthralgia 10/25/2020   Hip pain, acute, left 10/01/2020   Fall 10/01/2020   Wrist contusion 10/01/2020   Hypokalemia 07/12/2020   Gastroenteritis 04/27/2020   Dehydration 04/27/2020   Urinary frequency 02/28/2020   Peroneal tendinitis 11/01/2019   Spontaneous rupture of extensor tendon of left foot 10/28/2019   Contusion of left foot 10/27/2019   Sinusitis 04/13/2019   Rash 12/05/2016   Dyspnea 04/24/2016   Tricuspid valve insufficiency 04/24/2016   Bilateral impacted cerumen 01/14/2016   Conductive hearing loss in left ear 01/14/2016   Cerumen impaction 11/19/2015   Ear discomfort 11/13/2015   Grief at loss of child 09/14/2015   Well adult exam 09/14/2015   Dyslipidemia    PSVT (paroxysmal supraventricular tachycardia)    PAF (paroxysmal atrial fibrillation) ([place])  Viral gastroenteritis 07/21/2015   Acute upper respiratory infection 04/17/2015   Edema 11/08/2014   Chronic venous insufficiency 11/08/2014   Diarrhea 05/01/2014   Mass of left thigh 12/14/2013   Microhematuria 12/14/2013   Long term (current) use of anticoagulants 08/19/2013   CAD (coronary artery disease) 05/21/2011   Atrial tachycardia ([place]) 11/19/2010   CHEST PAIN 08/07/2009   Vitamin D deficiency 04/19/2008   SUPERFICIAL THROMBOPHLEBITIS  10/20/2007   DM2 (diabetes mellitus, type 2) ([place]) 03/16/2007   HYPERCHOLESTEROLEMIA 03/15/2007   Anxiety 03/15/2007   Essential hypertension 03/15/2007   Mitral valve disorder 03/15/2007   BRONCHITIS 03/15/2007   HIATAL HERNIA 03/15/2007   Irritable bowel syndrome 03/15/2007   Osteoarthritis 03/15/2007   Osteoporosis 03/15/2007   Past Medical History:  Diagnosis Date   Anxiety state, unspecified    Bronchitis, not specified as acute or chronic    Diaphragmatic hernia without mention of obstruction or gangrene    Dyslipidemia    Essential hypertension    Irritable bowel syndrome    Mitral valve disorders(424.0)    a. 08/2010 Echo: EF >55%, mild MR, mod TR, trace AI.   Non-obstructive CAD    a. 07/2010 Lexiscan MV: no ischemia, EF 78%; b. 07/2010 Cath: LM nl, LAD50/60p, 40 apical, LCX nl, RCA dominant, 30/40p, 53m   Osteoarthrosis, unspecified whether generalized or localized, unspecified site    Osteoporosis, unspecified    Other abnormal glucose    PAF (paroxysmal atrial fibrillation) (HCC)    a. CHA2DS2VASc = 4-->coumadin.   Phlebitis and thrombophlebitis of superficial vessels of lower extremities    PSVT (paroxysmal supraventricular tachycardia)    a. 05/2012 Holter: short bursts of PSVT noted-->Managed with toprol.   Unspecified venous (peripheral) insufficiency    Unspecified vitamin D deficiency     Family History  Problem Relation Age of Onset   Stroke Mother    Stroke Father        also heart disease   Clotting disorder Paternal Grandmother        blood clot   Stroke Brother        also heart disease    Past Surgical History:  Procedure Laterality Date   CARDIAC CATHETERIZATION  07/30/2010   nonobstructive CAD, 20-40% RCA stenosis, 50-60% eccentric LAD stenosis more prox, 40% LAD stenosis more distal, EF 65%   TRANSTHORACIC ECHOCARDIOGRAM  09/03/2010   EF=>55%; mild MR; mod TR; AV mildly sclerotic with trace regurg; mild pulm valve regurg   VESICOVAGINAL FISTULA  CLOSURE W/ TAH     Social History   Occupational History   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

## 2022-03-02 ENCOUNTER — Other Ambulatory Visit: Payer: Self-pay | Admitting: Internal Medicine

## 2022-03-02 DIAGNOSIS — I480 Paroxysmal atrial fibrillation: Secondary | ICD-10-CM

## 2022-03-03 ENCOUNTER — Ambulatory Visit (INDEPENDENT_AMBULATORY_CARE_PROVIDER_SITE_OTHER): Payer: Medicare HMO

## 2022-03-03 VITALS — Ht 66.0 in | Wt 188.0 lb

## 2022-03-03 DIAGNOSIS — Z0000 Encounter for general adult medical examination without abnormal findings: Secondary | ICD-10-CM | POA: Diagnosis not present

## 2022-03-03 NOTE — Patient Instructions (Signed)
[name] , Thank you for taking time to come for your Medicare Wellness Visit. I appreciate your ongoing commitment to your health goals. Please review the following plan we discussed and let me know if I can assist you in the future.   These are the goals we discussed:  Goals      Monitor and Manage My Blood Sugar-Diabetes Type 2     Timeframe:  Long-Range Goal Priority:  High Start Date:   01/04/2021                          Expected End Date:  03/06/2022                     Follow Up Date Sept 2023   - check blood sugar at prescribed times - check blood sugar if I feel it is too high or too low - enter blood sugar readings and medication or insulin into daily log - take the blood sugar log to all doctor visits - take the blood sugar meter to all doctor visits    Why is this important?   Checking your blood sugar at home helps to keep it from getting very high or very low.  Writing the results in a diary or log helps the doctor know how to care for you.  Your blood sugar log should have the time, date and the results.  Also, write down the amount of insulin or other medicine that you take.  Other information, like what you ate, exercise done and how you were feeling, will also be helpful.       Track and Manage My Blood Pressure-Hypertension     Timeframe:  Long-Range Goal Priority:  High Start Date:   01/04/2021                          Expected End Date:  03/06/2022                     Follow Up Date Sept 2023   - check blood pressure daily - choose a place to take my blood pressure (home, clinic or office, retail store) - write blood pressure results in a log or diary    Why is this important?   You won't feel high blood pressure, but it can still hurt your blood vessels.  High blood pressure can cause heart or kidney problems. It can also cause a stroke.  Making lifestyle changes like losing a little weight or eating less salt will help.  Checking your blood  pressure at home and at different times of the day can help to control blood pressure.  If the doctor prescribes medicine remember to take it the way the doctor ordered.  Call the office if you cannot afford the medicine or if there are questions about it.     Notes:         This is a list of the screening recommended for you and due dates:  Health Maintenance  Topic Date Due   Yearly kidney health urinalysis for diabetes  Never done   DEXA scan (bone density measurement)  Never done   Complete foot exam   10/30/2018   COVID-19 Vaccine (4 - Mixed Product risk series) 03/07/2021   Flu Shot  03/13/2022*   Zoster (Shingles) Vaccine (1 of 2) 03/21/2022*   Eye exam for diabetics  08/30/2022  Yearly kidney function blood test for diabetes  12/11/2022   Medicare Annual Wellness Visit  03/04/2023   Pneumonia Vaccine  Completed   HPV Vaccine  Aged Out  *Topic was postponed. The date shown is not the original due date.    Advanced directives: No  Conditions/risks identified: Yes; Type II Diabetes Mellitus  Next appointment: Follow up in one year for your annual wellness visit.   Preventive Care 58 Years and Older, Female Preventive care refers to lifestyle choices and visits with your health care provider that can promote health and wellness. What does preventive care include? A yearly physical exam. This is also called an annual well check. Dental exams once or twice a year. Routine eye exams. Ask your health care provider how often you should have your eyes checked. Personal lifestyle choices, including: Daily care of your teeth and gums. Regular physical activity. Eating a healthy diet. Avoiding tobacco and drug use. Limiting alcohol use. Practicing safe sex. Taking low-dose aspirin every day. Taking vitamin and mineral supplements as recommended by your health care provider. What happens during an annual well check? The services and screenings done by your health care  provider during your annual well check will depend on your age, overall health, lifestyle risk factors, and family history of disease. Counseling  Your health care provider may ask you questions about your: Alcohol use. Tobacco use. Drug use. Emotional well-being. Home and relationship well-being. Sexual activity. Eating habits. History of falls. Memory and ability to understand (cognition). Work and work Statistician. Reproductive health. Screening  You may have the following tests or measurements: Height, weight, and BMI. Blood pressure. Lipid and cholesterol levels. These may be checked every 5 years, or more frequently if you are over 50 years old. Skin check. Lung cancer screening. You may have this screening every year starting at age 45 if you have a 30-pack-year history of smoking and currently smoke or have quit within the past 15 years. Fecal occult blood test (FOBT) of the stool. You may have this test every year starting at age 51. Flexible sigmoidoscopy or colonoscopy. You may have a sigmoidoscopy every 5 years or a colonoscopy every 10 years starting at age 9. Hepatitis C blood test. Hepatitis B blood test. Sexually transmitted disease (STD) testing. Diabetes screening. This is done by checking your blood sugar (glucose) after you have not eaten for a while (fasting). You may have this done every 1-3 years. Bone density scan. This is done to screen for osteoporosis. You may have this done starting at age 15. Mammogram. This may be done every 1-2 years. Talk to your health care provider about how often you should have regular mammograms. Talk with your health care provider about your test results, treatment options, and if necessary, the need for more tests. Vaccines  Your health care provider may recommend certain vaccines, such as: Influenza vaccine. This is recommended every year. Tetanus, diphtheria, and acellular pertussis (Tdap, Td) vaccine. You may need a Td  booster every 10 years. Zoster vaccine. You may need this after age 88. Pneumococcal 13-valent conjugate (PCV13) vaccine. One dose is recommended after age 7. Pneumococcal polysaccharide (PPSV23) vaccine. One dose is recommended after age 79. Talk to your health care provider about which screenings and vaccines you need and how often you need them. This information is not intended to replace advice given to you by your health care provider. Make sure you discuss any questions you have with your health care provider. Document Released: 04/27/2015  Document Revised: 12/19/2015 Document Reviewed: 01/30/2015 Elsevier Interactive Patient Education  2017 [place] Prevention in the Home Falls can cause injuries. They can happen to people of all ages. There are many things you can do to make your home safe and to help prevent falls. What can I do on the outside of my home? Regularly fix the edges of walkways and driveways and fix any cracks. Remove anything that might make you trip as you walk through a door, such as a raised step or threshold. Trim any bushes or trees on the path to your home. Use bright outdoor lighting. Clear any walking paths of anything that might make someone trip, such as rocks or tools. Regularly check to see if handrails are loose or broken. Make sure that both sides of any steps have handrails. Any raised decks and porches should have guardrails on the edges. Have any leaves, snow, or ice cleared regularly. Use sand or salt on walking paths during winter. Clean up any spills in your garage right away. This includes oil or grease spills. What can I do in the bathroom? Use night lights. Install grab bars by the toilet and in the tub and shower. Do not use towel bars as grab bars. Use non-skid mats or decals in the tub or shower. If you need to sit down in the shower, use a plastic, non-slip stool. Keep the floor dry. Clean up any water that spills on the floor  as soon as it happens. Remove soap buildup in the tub or shower regularly. Attach bath mats securely with double-sided non-slip rug tape. Do not have throw rugs and other things on the floor that can make you trip. What can I do in the bedroom? Use night lights. Make sure that you have a light by your bed that is easy to reach. Do not use any sheets or blankets that are too big for your bed. They should not hang down onto the floor. Have a firm chair that has side arms. You can use this for support while you get dressed. Do not have throw rugs and other things on the floor that can make you trip. What can I do in the kitchen? Clean up any spills right away. Avoid walking on wet floors. Keep items that you use a lot in easy-to-reach places. If you need to reach something above you, use a strong step stool that has a grab bar. Keep electrical cords out of the way. Do not use floor polish or wax that makes floors slippery. If you must use wax, use non-skid floor wax. Do not have throw rugs and other things on the floor that can make you trip. What can I do with my stairs? Do not leave any items on the stairs. Make sure that there are handrails on both sides of the stairs and use them. Fix handrails that are broken or loose. Make sure that handrails are as long as the stairways. Check any carpeting to make sure that it is firmly attached to the stairs. Fix any carpet that is loose or worn. Avoid having throw rugs at the top or bottom of the stairs. If you do have throw rugs, attach them to the floor with carpet tape. Make sure that you have a light switch at the top of the stairs and the bottom of the stairs. If you do not have them, ask someone to add them for you. What else can I do to help prevent falls? Wear  shoes that: Do not have high heels. Have rubber bottoms. Are comfortable and fit you well. Are closed at the toe. Do not wear sandals. If you use a stepladder: Make sure that it is  fully opened. Do not climb a closed stepladder. Make sure that both sides of the stepladder are locked into place. Ask someone to hold it for you, if possible. Clearly mark and make sure that you can see: Any grab bars or handrails. First and last steps. Where the edge of each step is. Use tools that help you move around (mobility aids) if they are needed. These include: Canes. Walkers. Scooters. Crutches. Turn on the lights when you go into a dark area. Replace any light bulbs as soon as they burn out. Set up your furniture so you have a clear path. Avoid moving your furniture around. If any of your floors are uneven, fix them. If there are any pets around you, be aware of where they are. Review your medicines with your doctor. Some medicines can make you feel dizzy. This can increase your chance of falling. Ask your doctor what other things that you can do to help prevent falls. This information is not intended to replace advice given to you by your health care provider. Make sure you discuss any questions you have with your health care provider. Document Released: 01/25/2009 Document Revised: 09/06/2015 Document Reviewed: 05/05/2014 Elsevier Interactive Patient Education  2017 [employer].

## 2022-03-03 NOTE — Telephone Encounter (Signed)
Refill request for warfarin:  Last INR was 2.3 on 02/18/22 Next INR due on 04/03/22 LOV was 02/18/22  Janan Ridge PA  Refill approved.

## 2022-03-03 NOTE — Progress Notes (Cosign Needed Addendum)
Virtual Visit via Telephone Note  I connected with  [name] on 03/03/22 at 11:30 AM EST by telephone and verified that I am speaking with the correct person using two identifiers.  Location: Patient: Home Provider: Hundred Persons participating in the virtual visit: [place]   I discussed the limitations, risks, security and privacy concerns of performing an evaluation and management service by telephone and the availability of in person appointments. The patient expressed understanding and agreed to proceed.  Interactive audio and video telecommunications were attempted between this nurse and patient, however failed, due to patient having technical difficulties OR patient did not have access to video capability.  We continued and completed visit with audio only.  Some vital signs may be absent or patient reported.   Sheral Flow, LPN  Subjective:   [name] is a 87 y.o. female who presents for Medicare Annual (Subsequent) preventive examination.  Review of Systems     Cardiac Risk Factors include: advanced age (>13mn, >>7women);diabetes mellitus;dyslipidemia;family history of premature cardiovascular disease;hypertension;obesity (BMI >30kg/m2);sedentary lifestyle     Objective:    Today's Vitals   03/03/22 1131  Weight: 188 lb (85.3 kg)  Height: '5\' 6"'$  (1.676 m)  PainSc: 0-No pain   Body mass index is 30.34 kg/m.     03/03/2022   11:35 AM 02/28/2021    1:47 PM 12/06/2020   10:30 AM 06/23/2020   12:38 AM 05/24/2020    1:14 PM 05/20/2020    1:32 AM 05/01/2020   11:44 AM  Advanced Directives  Does Patient Have a Medical Advance Directive? No Yes Yes No No No No  Type of Advance Directive  Living will Living will      Does patient want to make changes to medical advance directive?  No - Patient declined       Would patient like information on creating a medical advance directive? No - Patient declined    No - Patient  declined      Current Medications (verified) Outpatient Encounter Medications as of 03/03/2022  Medication Sig   Accu-Chek Softclix Lancets lancets USE AS INSTRUCTED   acetaminophen (TYLENOL) 500 MG tablet Take 500 mg by mouth every 6 (six) hours as needed.   Alcohol Swabs (DROPSAFE ALCOHOL PREP) 70 % PADS USE TO CLEAN INJECTION SITE TO CHECK BLOOD SUGARS DAILY   ALPRAZolam (XANAX) 0.5 MG tablet TAKE 1/2 TO 1 TABLET BY MOUTH THREE TIMES DAILY AS NEEDED FOR ANXIETY. DO NOT TAKE WITH TRAMADOL.THIS IS A 30 DAY SUPPLY   amLODipine (NORVASC) 5 MG tablet TAKE 1 TABLET(5 MG) BY MOUTH DAILY   aspirin 81 MG tablet Take 81 mg by mouth daily.   Cholecalciferol (VITAMIN D3) 50 MCG (2000 UT) capsule TAKE 1 CAPSULE BY MOUTH EVERY DAY   diclofenac Sodium (VOLTAREN) 1 % GEL Apply 2 g topically 4 (four) times daily.   furosemide (LASIX) 20 MG tablet TAKE 1 TABLET(20 MG) BY MOUTH DAILY AS NEEDED FOR SWELLING   glucose blood (ACCU-CHEK AVIVA PLUS) test strip USE TO CHECK BLOOD SUGARS DAILY   hydroxypropyl methylcellulose (ISOPTO TEARS) 2.5 % ophthalmic solution Place 1 drop into both eyes 2 (two) times daily.   hyoscyamine (LEVSIN) 0.125 MG tablet Take 1 tablet (0.125 mg total) by mouth every 6 (six) hours as needed for up to 10 days for cramping.   isosorbide mononitrate (IMDUR) 30 MG 24 hr tablet TAKE 1 TABLET BY MOUTH EVERY DAY   metoprolol succinate (TOPROL-XL) 50  MG 24 hr tablet TAKE 1 AND 1/2 TABLETS BY MOUTH TWICE DAILY   pantoprazole (PROTONIX) 40 MG tablet TAKE 1 TABLET BY MOUTH EVERY DAY   simvastatin (ZOCOR) 20 MG tablet TAKE 1 TABLET BY MOUTH AT BEDTIME   traMADol (ULTRAM) 50 MG tablet Take 1 tablet (50 mg total) by mouth every 12 (twelve) hours as needed.   warfarin (COUMADIN) 5 MG tablet TAKE 1/2 TO 1 TABLET BY MOUTH DAILY AS DIRECTED BY COUMADIN CLINIC   [DISCONTINUED] warfarin (COUMADIN) 5 MG tablet TAKE 1/2 TO 1 TABLET BY MOUTH DAILY AS DIRECTED BY COUMADIN CLINIC   No facility-administered  encounter medications on file as of 03/03/2022.    Allergies (verified) Clarithromycin, Fosamax [alendronate sodium], Guaifenesin er, Metformin, Prandin [repaglinide], and Tape   History: Past Medical History:  Diagnosis Date   Anxiety state, unspecified    Bronchitis, not specified as acute or chronic    Diaphragmatic hernia without mention of obstruction or gangrene    Dyslipidemia    Essential hypertension    Irritable bowel syndrome    Mitral valve disorders(424.0)    a. 08/2010 Echo: EF >55%, mild MR, mod TR, trace AI.   Non-obstructive CAD    a. 07/2010 Lexiscan MV: no ischemia, EF 78%; b. 07/2010 Cath: LM nl, LAD50/60p, 40 apical, LCX nl, RCA dominant, 30/40p, 73m   Osteoarthrosis, unspecified whether generalized or localized, unspecified site    Osteoporosis, unspecified    Other abnormal glucose    PAF (paroxysmal atrial fibrillation) (HCC)    a. CHA2DS2VASc = 4-->coumadin.   Phlebitis and thrombophlebitis of superficial vessels of lower extremities    PSVT (paroxysmal supraventricular tachycardia)    a. 05/2012 Holter: short bursts of PSVT noted-->Managed with toprol.   Unspecified venous (peripheral) insufficiency    Unspecified vitamin D deficiency    Past Surgical History:  Procedure Laterality Date   CARDIAC CATHETERIZATION  07/30/2010   nonobstructive CAD, 20-40% RCA stenosis, 50-60% eccentric LAD stenosis more prox, 40% LAD stenosis more distal, EF 65%   TRANSTHORACIC ECHOCARDIOGRAM  09/03/2010   EF=>55%; mild MR; mod TR; AV mildly sclerotic with trace regurg; mild pulm valve regurg   VESICOVAGINAL FISTULA CLOSURE W/ TAH     Family History  Problem Relation Age of Onset   Stroke Mother    Stroke Father        also heart disease   Clotting disorder Paternal Grandmother        blood clot   Stroke Brother        also heart disease   Social History   Socioeconomic History   Marital status: Widowed    Spouse name: Not on file   Number of children: 4   Years  of education: Not on file   Highest education level: Not on file  Occupational History   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  Other Topics Concern   Not on file  Social History Narrative   Not on file   Social Determinants of Health   Financial Resource Strain: Low Risk  (03/03/2022)   Overall Financial Resource Strain (CARDIA)    Difficulty of Paying Living Expenses: Not hard at all  Food Insecurity: No Food Insecurity (03/03/2022)   Hunger Vital Sign  Worried About Charity fundraiser in the Last Year: Never true    [place] in the Last Year: Never true  Transportation Needs: No Transportation Needs (03/03/2022)   PRAPARE - Hydrologist (Medical): No    Lack of Transportation (Non-Medical): No  Physical Activity: Inactive (03/03/2022)   Exercise Vital Sign    Days of Exercise per Week: 0 days    Minutes of Exercise per Session: 0 min  Stress: No Stress Concern Present (03/03/2022)   [place]    Feeling of Stress : Not at all  Social Connections: Moderately Integrated (03/03/2022)   Social Connection and Isolation Panel [NHANES]    Frequency of Communication with Friends and Family: More than three times a week    Frequency of Social Gatherings with Friends and Family: More than three times a week    Attends Religious Services: More than 4 times per year    Active Member of [employer] or Organizations: Yes    Attends Archivist Meetings: More than 4 times per year    Marital Status: Widowed    Tobacco Counseling Counseling given: Not Answered   Clinical Intake:  Pre-visit preparation completed: Yes  Pain : No/denies pain Pain Score: 0-No pain      BMI - recorded: 30.34 Nutritional Status: BMI > 30  Obese Nutritional Risks: None Diabetes: Yes CBG done?: No Did pt. bring in CBG monitor from home?: No  How often do you need to have someone help you when you read instructions, pamphlets, or other written materials from your doctor or pharmacy?: 1 - Never What is the last grade level you completed in school?: HSG  Nutrition Risk Assessment:  Has the patient had any N/V/D within the last 2 months?  No  Does the patient have any non-healing wounds?  No  Has the patient had any unintentional weight loss or weight gain?  No   Diabetes:  Is the patient diabetic?  Yes  If diabetic, was a CBG obtained today?  No  Did the patient bring in their glucometer from home?  No  How often do you monitor your CBG's? Twice a day.   Financial Strains and Diabetes Management:  Are you having any financial strains with the device, your supplies or your medication? Yes .  Does the patient want to be seen by Chronic Care Management for management of their diabetes?  No  Would the patient like to be referred to a Nutritionist or for Diabetic Management?  No   Diabetic Exams:  Diabetic Eye Exam: Completed 08/29/2021 Diabetic Foot Exam: Overdue, Pt has been advised about the importance in completing this exam. Pt is scheduled for diabetic foot exam on next ov visit.   Interpreter Needed?: No  Information entered by :: [name], LPN.   Activities of Daily Living    03/03/2022   11:40 AM  In your present state of health, do you have any difficulty performing the following activities:  Hearing? 0  Vision? 0  Difficulty concentrating or making decisions? 0  Walking or climbing stairs? 1  Dressing or bathing? 0  Doing errands, shopping? 0  Preparing Food and eating ? N  Using the Toilet? N  In the past six months, have you accidently leaked urine? N  Do you have problems with loss of bowel control? N  Managing your Medications? N   Managing your Finances? N  Housekeeping  or managing your Housekeeping? N    Patient Care Team: Plotnikov, Evie Lacks, MD as PCP - General (Internal Medicine) Debara Pickett Nadean Corwin, MD as PCP - Cardiology (Cardiology) Warden Fillers, MD as Consulting Physician (Ophthalmology) Szabat, Darnelle Maffucci, [facility] (Inactive) as Pharmacist (Pharmacist)  Indicate any recent Medical Services you may have received from other than [facility] providers in the past year (date may be approximate).     Assessment:   This is a routine wellness examination for [facility].  Hearing/Vision screen Hearing Screening - Comments:: Patient has hearing loss; no hearing aids.  Vision Screening - Comments:: Wears rx glasses - up to date with routine eye exams with Warden Fillers, MD.   Dietary issues and exercise activities discussed: Current Exercise Habits: The patient does not participate in regular exercise at present, Exercise limited by: orthopedic condition(s)   Goals Addressed   None   Depression Screen    03/03/2022   11:38 AM 12/10/2021   11:03 AM 02/28/2021    1:55 PM 07/12/2020    2:08 PM 11/29/2019   12:02 PM 11/29/2019   11:24 AM 11/25/2018   12:00 PM  PHQ 2/9 Scores  PHQ - 2 Score 0 0 0 0 0 0 0  PHQ- 9 Score  0         Fall Risk    03/03/2022   11:40 AM 12/10/2021   11:03 AM 02/28/2021    1:55 PM 07/12/2020    2:08 PM 11/29/2019   11:24 AM  [place] in the past year? 0 0 1 0 0  Number falls in past yr: 0 0 0 0 0  Injury with Fall? 0 0 0 0 0  Risk for fall due to : No Fall Risks History of fall(s)   No Fall Risks  Follow up Falls prevention discussed Falls evaluation completed  Falls evaluation completed Falls evaluation completed    [place]:  Any stairs in or around the home? No  If so, are there any without handrails? No  Home free of loose throw rugs in walkways, pet beds, electrical cords, etc? Yes  Adequate lighting in your home to reduce risk  of falls? Yes   ASSISTIVE DEVICES UTILIZED TO PREVENT FALLS:  Life alert? No  Use of a cane, walker or w/c? Yes  Grab bars in the bathroom? Yes  Shower chair or bench in shower? Yes  Elevated toilet seat or a handicapped toilet? Yes   TIMED UP AND GO:  Was the test performed? No . Phone Visit  Cognitive Function:    10/29/2017   12:45 PM  MMSE - Mini Mental State Exam  Orientation to time 5  Orientation to Place 5  Registration 3  Attention/ Calculation 4  Recall 1  Language- name 2 objects 2  Language- repeat 1  Language- follow 3 step command 3  Language- read & follow direction 1  Write a sentence 1  Copy design 1  Total score 27        03/03/2022   11:42 AM 11/25/2018   12:04 PM  6CIT Screen  What Year? 0 points 0 points  What month? 0 points 0 points  What time? 0 points 0 points  Count back from 20 0 points 0 points  Months in reverse 0 points 4 points  Repeat phrase 0 points 4 points  Total Score 0 points 8 points    Immunizations Immunization History  Administered Date(s) Administered  Fluad Quad(high Dose 65+) 02/15/2019, 02/07/2020, 01/29/2021   Influenza Split 01/21/2011, 02/24/2012   Influenza Whole 02/02/2009, 05/21/2010   Influenza, High Dose Seasonal PF 03/18/2016, 03/31/2017, 02/10/2018   Influenza,inj,Quad PF,6+ Mos 01/11/2013, 05/01/2014, 02/08/2015   Moderna SARS-COV2 Booster Vaccination 08/03/2020   Moderna Sars-Covid-2 Vaccination 06/13/2019, 07/11/2019   PNEUMOCOCCAL CONJUGATE-20 12/10/2021   Pfizer Covid-19 Vaccine Bivalent Booster 36yr & up 01/10/2021   Tdap 10/01/2020    TDAP status: Up to date  Flu Vaccine status: Due, Education has been provided regarding the importance of this vaccine. Advised may receive this vaccine at local pharmacy or Health Dept. Aware to provide a copy of the vaccination record if obtained from local pharmacy or Health Dept. Verbalized acceptance and understanding.  Pneumococcal vaccine status: Due,  Education has been provided regarding the importance of this vaccine. Advised may receive this vaccine at local pharmacy or Health Dept. Aware to provide a copy of the vaccination record if obtained from local pharmacy or Health Dept. Verbalized acceptance and understanding.  Covid-19 vaccine status: Completed vaccines  Qualifies for Shingles Vaccine? Yes   Zostavax completed No   Shingrix Completed?: No.    Education has been provided regarding the importance of this vaccine. Patient has been advised to call insurance company to determine out of pocket expense if they have not yet received this vaccine. Advised may also receive vaccine at local pharmacy or Health Dept. Verbalized acceptance and understanding.  Screening Tests Health Maintenance  Topic Date Due   Diabetic kidney evaluation - Urine ACR  Never done   DEXA SCAN  Never done   FOOT EXAM  10/30/2018   COVID-19 Vaccine (4 - Mixed Product risk series) 03/07/2021   INFLUENZA VACCINE  03/13/2022 (Originally 11/12/2021)   Zoster Vaccines- Shingrix (1 of 2) 03/21/2022 (Originally 10/04/1953)   OPHTHALMOLOGY EXAM  08/30/2022   Diabetic kidney evaluation - GFR measurement  12/11/2022   Medicare Annual Wellness (AWV)  03/04/2023   Pneumonia Vaccine 87 Years old  Completed   HPV VACCINES  Aged Out    Health Maintenance  Health Maintenance Due  Topic Date Due   Diabetic kidney evaluation - Urine ACR  Never done   DEXA SCAN  Never done   FOOT EXAM  10/30/2018   COVID-19 Vaccine (4 - Mixed Product risk series) 03/07/2021    Colorectal cancer screening: No longer required.   Mammogram status: Completed 01/17/2022. Repeat every year  Bone density status: never done  Lung Cancer Screening: (Low Dose CT Chest recommended if Age 85-80years, 30 pack-year currently smoking OR have quit w/in 15years.) does not qualify.   Lung Cancer Screening Referral: no  Additional Screening:  Hepatitis C Screening: does not qualify; Completed  no  Vision Screening: Recommended annual ophthalmology exams for early detection of glaucoma and other disorders of the eye. Is the patient up to date with their annual eye exam?  Yes  Who is the provider or what is the name of the office in which the patient attends annual eye exams? CWarden Fillers MD. If pt is not established with a provider, would they like to be referred to a provider to establish care? No .   Dental Screening: Recommended annual dental exams for proper oral hygiene  Community Resource Referral / Chronic Care Management: CRR required this visit?  No   CCM required this visit?  No      Plan:     I have personally reviewed and noted the following in the patient's chart:  Medical and social history Use of alcohol, tobacco or illicit drugs  Current medications and supplements including opioid prescriptions. Patient is not currently taking opioid prescriptions. Functional ability and status Nutritional status Physical activity Advanced directives List of other physicians Hospitalizations, surgeries, and ER visits in previous 12 months Vitals Screenings to include cognitive, depression, and falls Referrals and appointments  In addition, I have reviewed and discussed with patient certain preventive protocols, quality metrics, and best practice recommendations. A written personalized care plan for preventive services as well as general preventive health recommendations were provided to patient.     Sheral Flow, LPN   [id number]   Nurse Notes: N/A   Medical screening examination/treatment/procedure(s) were performed by non-physician practitioner and as supervising physician I was immediately available for consultation/collaboration.  I agree with above. Lew Dawes, MD

## 2022-03-11 ENCOUNTER — Ambulatory Visit (INDEPENDENT_AMBULATORY_CARE_PROVIDER_SITE_OTHER): Payer: Medicare HMO | Admitting: Internal Medicine

## 2022-03-11 ENCOUNTER — Encounter: Payer: Self-pay | Admitting: Internal Medicine

## 2022-03-11 VITALS — BP 128/78 | HR 60 | Temp 97.5°F | Ht 66.0 in | Wt 189.0 lb

## 2022-03-11 DIAGNOSIS — F419 Anxiety disorder, unspecified: Secondary | ICD-10-CM

## 2022-03-11 DIAGNOSIS — E119 Type 2 diabetes mellitus without complications: Secondary | ICD-10-CM | POA: Diagnosis not present

## 2022-03-11 DIAGNOSIS — I10 Essential (primary) hypertension: Secondary | ICD-10-CM | POA: Diagnosis not present

## 2022-03-11 DIAGNOSIS — Z23 Encounter for immunization: Secondary | ICD-10-CM | POA: Diagnosis not present

## 2022-03-11 DIAGNOSIS — M47816 Spondylosis without myelopathy or radiculopathy, lumbar region: Secondary | ICD-10-CM

## 2022-03-11 LAB — COMPREHENSIVE METABOLIC PANEL
ALT: 12 U/L (ref 0–35)
AST: 18 U/L (ref 0–37)
Albumin: 4.2 g/dL (ref 3.5–5.2)
Alkaline Phosphatase: 60 U/L (ref 39–117)
BUN: 17 mg/dL (ref 6–23)
CO2: 31 mEq/L (ref 19–32)
Calcium: 9.1 mg/dL (ref 8.4–10.5)
Chloride: 103 mEq/L (ref 96–112)
Creatinine, Ser: 1.01 mg/dL (ref 0.40–1.20)
GFR: 50.79 mL/min — ABNORMAL LOW (ref >60.00)
Glucose, Bld: 112 mg/dL — ABNORMAL HIGH (ref 70–99)
Potassium: 4.1 mEq/L (ref 3.5–5.1)
Sodium: 138 mEq/L (ref 135–145)
Total Bilirubin: 0.4 mg/dL (ref 0.2–1.2)
Total Protein: 7.2 g/dL (ref 6.0–8.3)

## 2022-03-11 LAB — HEMOGLOBIN A1C: Hgb A1c MFr Bld: 7.5 % — ABNORMAL HIGH (ref 4.6–6.5)

## 2022-03-11 LAB — TSH: TSH: 0.89 u[IU]/mL (ref 0.35–5.50)

## 2022-03-11 MED ORDER — ALPRAZOLAM 0.5 MG PO TABS: ORAL_TABLET | ORAL | 3 refills | Status: DC

## 2022-03-11 NOTE — Assessment & Plan Note (Signed)
Check A1c On diet

## 2022-03-11 NOTE — Progress Notes (Signed)
Subjective:  Patient ID: [name], female    DOB: 05-Jul-1934  Age: 87 y.o. MRN: [id number]  CC: Follow-up (Left knee pain)   HPI [name] presents for L knee pain: pt saw Dr Erlinda Hong; she had a shot F/u anxiety, HTN  Outpatient Medications Prior to Visit  Medication Sig Dispense Refill   Accu-Chek Softclix Lancets lancets USE AS INSTRUCTED 100 each 3   acetaminophen (TYLENOL) 500 MG tablet Take 500 mg by mouth every 6 (six) hours as needed.     Alcohol Swabs (DROPSAFE ALCOHOL PREP) 70 % PADS USE TO CLEAN INJECTION SITE TO CHECK BLOOD SUGARS DAILY 100 each 3   amLODipine (NORVASC) 5 MG tablet TAKE 1 TABLET(5 MG) BY MOUTH DAILY 90 tablet 3   aspirin 81 MG tablet Take 81 mg by mouth daily.     Cholecalciferol (VITAMIN D3) 50 MCG (2000 UT) capsule TAKE 1 CAPSULE BY MOUTH EVERY DAY 100 capsule 3   diclofenac Sodium (VOLTAREN) 1 % GEL Apply 2 g topically 4 (four) times daily. 150 g 1   furosemide (LASIX) 20 MG tablet TAKE 1 TABLET(20 MG) BY MOUTH DAILY AS NEEDED FOR SWELLING 90 tablet 3   glucose blood (ACCU-CHEK AVIVA PLUS) test strip USE TO CHECK BLOOD SUGARS DAILY 100 strip 3   hydroxypropyl methylcellulose (ISOPTO TEARS) 2.5 % ophthalmic solution Place 1 drop into both eyes 2 (two) times daily.     isosorbide mononitrate (IMDUR) 30 MG 24 hr tablet TAKE 1 TABLET BY MOUTH EVERY DAY 90 tablet 3   metoprolol succinate (TOPROL-XL) 50 MG 24 hr tablet TAKE 1 AND 1/2 TABLETS BY MOUTH TWICE DAILY 270 tablet 3   pantoprazole (PROTONIX) 40 MG tablet TAKE 1 TABLET BY MOUTH EVERY DAY 90 tablet 3   simvastatin (ZOCOR) 20 MG tablet TAKE 1 TABLET BY MOUTH AT BEDTIME 90 tablet 1   traMADol (ULTRAM) 50 MG tablet Take 1 tablet (50 mg total) by mouth every 12 (twelve) hours as needed. 30 tablet 2   warfarin (COUMADIN) 5 MG tablet TAKE 1/2 TO 1 TABLET BY MOUTH DAILY AS DIRECTED BY COUMADIN CLINIC 90 tablet 1   ALPRAZolam (XANAX) 0.5 MG tablet TAKE 1/2 TO 1 TABLET BY MOUTH THREE TIMES DAILY AS NEEDED  FOR ANXIETY. DO NOT TAKE WITH TRAMADOL.THIS IS A 30 DAY SUPPLY 90 tablet 3   hyoscyamine (LEVSIN) 0.125 MG tablet Take 1 tablet (0.125 mg total) by mouth every 6 (six) hours as needed for up to 10 days for cramping. 100 tablet 3   No facility-administered medications prior to visit.    ROS: Review of Systems  Constitutional:  Negative for activity change, appetite change, chills, fatigue and unexpected weight change.  HENT:  Negative for congestion, mouth sores and sinus pressure.   Eyes:  Negative for visual disturbance.  Respiratory:  Negative for cough and chest tightness.   Gastrointestinal:  Negative for abdominal pain and nausea.  Genitourinary:  Negative for difficulty urinating, frequency and vaginal pain.  Musculoskeletal:  Positive for arthralgias and gait problem. Negative for back pain.  Skin:  Negative for pallor and rash.  Neurological:  Negative for dizziness, tremors, weakness, numbness and headaches.  Psychiatric/Behavioral:  Negative for confusion, sleep disturbance and suicidal ideas. The patient is nervous/anxious.     Objective:  BP 128/78 (BP Location: Left Arm, Patient Position: Sitting, Cuff Size: Normal)   Pulse 60   Temp (!) 97.5 F (36.4 C) (Oral)   Ht '5\' 6"'$  (1.676 m)  Wt 189 lb (85.7 kg)   SpO2 98%   BMI 30.51 kg/m   BP Readings from Last 3 Encounters:  03/11/22 128/78  02/18/22 132/68  12/20/21 (!) 140/72    Wt Readings from Last 3 Encounters:  03/11/22 189 lb (85.7 kg)  03/03/22 188 lb (85.3 kg)  02/18/22 188 lb (85.3 kg)    Physical Exam Constitutional:      General: She is not in acute distress.    Appearance: She is well-developed. She is obese.  HENT:     Head: Normocephalic.     Right Ear: External ear normal.     Left Ear: External ear normal.     Nose: Nose normal.  Eyes:     General:        Right eye: No discharge.        Left eye: No discharge.     Conjunctiva/sclera: Conjunctivae normal.     Pupils: Pupils are equal,  round, and reactive to light.  Neck:     Thyroid: No thyromegaly.     Vascular: No JVD.     Trachea: No tracheal deviation.  Cardiovascular:     Rate and Rhythm: Normal rate and regular rhythm.     Heart sounds: Normal heart sounds.  Pulmonary:     Effort: No respiratory distress.     Breath sounds: No stridor. No wheezing.  Abdominal:     General: Bowel sounds are normal. There is no distension.     Palpations: Abdomen is soft. There is no mass.     Tenderness: There is no abdominal tenderness. There is no guarding or rebound.  Musculoskeletal:        General: Tenderness present.     Cervical back: Normal range of motion and neck supple. No rigidity.  Lymphadenopathy:     Cervical: No cervical adenopathy.  Skin:    Findings: No erythema or rash.  Neurological:     Mental Status: She is oriented to person, place, and time.     Cranial Nerves: No cranial nerve deficit.     Motor: No abnormal muscle tone.     Coordination: Coordination normal.     Deep Tendon Reflexes: Reflexes normal.  Psychiatric:        Behavior: Behavior normal.        Thought Content: Thought content normal.        Judgment: Judgment normal.  L knee painful  Lab Results  Component Value Date   WBC 4.5 02/05/2021   HGB 12.3 02/05/2021   HCT 37.6 02/05/2021   PLT 201.0 02/05/2021   GLUCOSE 129 (H) 12/10/2021   CHOL 184 02/27/2022   TRIG 92 02/27/2022   HDL 70 02/27/2022   LDLDIRECT 105.5 11/26/2010   LDLCALC 97 02/27/2022   ALT 10 12/10/2021   AST 15 12/10/2021   NA 137 12/10/2021   K 4.1 12/10/2021   CL 103 12/10/2021   CREATININE 1.27 (H) 12/10/2021   BUN 19 12/10/2021   CO2 27 12/10/2021   TSH 0.87 08/07/2021   INR 2.3 02/18/2022   HGBA1C 7.6 (H) 12/10/2021    MM 3D SCREEN BREAST BILATERAL  Result Date: 01/20/2022 CLINICAL DATA:  Screening. EXAM: DIGITAL SCREENING BILATERAL MAMMOGRAM WITH TOMOSYNTHESIS AND CAD TECHNIQUE: Bilateral screening digital craniocaudal and mediolateral oblique  mammograms were obtained. Bilateral screening digital breast tomosynthesis was performed. The images were evaluated with computer-aided detection. COMPARISON:  Previous exam(s). ACR Breast Density Category c: The breast tissue is heterogeneously dense, which may obscure  small masses. FINDINGS: There are no findings suspicious for malignancy. IMPRESSION: No mammographic evidence of malignancy. A result letter of this screening mammogram will be mailed directly to the patient. RECOMMENDATION: Screening mammogram in one year. (Code:SM-B-01Y) BI-RADS CATEGORY  1: Negative. Electronically Signed   By: Abelardo Diesel M.D.   On: 01/20/2022 15:07    Assessment & Plan:   Problem List Items Addressed This Visit     Anxiety    Cont on Xanax prn  Potential benefits of a long term benzodiazepines  use as well as potential risks  and complications were explained to the patient and were aknowledged. Not to take w/Tramadol      Relevant Medications   ALPRAZolam (XANAX) 0.5 MG tablet   Other Relevant Orders   Comprehensive metabolic panel   TSH   DM2 (diabetes mellitus, type 2) (HCC)    Check A1c On diet      Relevant Orders   Comprehensive metabolic panel   TSH   Hemoglobin A1c   Essential hypertension    Chronic Cont on Metoprolol, Norvasc      Osteoarthritis    Get a hurrycane cane      Other Visit Diagnoses     Flu vaccine need    -  Primary   Relevant Orders   Flu Vaccine QUAD High Dose(Fluad) (Completed)         Meds ordered this encounter  Medications   ALPRAZolam (XANAX) 0.5 MG tablet    Sig: TAKE 1/2 TO 1 TABLET BY MOUTH THREE TIMES DAILY AS NEEDED FOR ANXIETY. DO NOT TAKE WITH TRAMADOL.THIS IS A 30 DAY SUPPLY    Dispense:  90 tablet    Refill:  3      Follow-up: Return in about 3 months (around 06/11/2022) for a follow-up visit.  Walker Kehr, MD

## 2022-03-11 NOTE — Assessment & Plan Note (Signed)
Cont on Xanax prn  Potential benefits of a long term benzodiazepines  use as well as potential risks  and complications were explained to the patient and were aknowledged. Not to take w/Tramadol

## 2022-03-11 NOTE — Patient Instructions (Addendum)
Blue-Emu cream -- use 2-3 times a day   Get a hurrycane cane

## 2022-03-11 NOTE — Assessment & Plan Note (Signed)
Get a hurrycane cane

## 2022-03-11 NOTE — Assessment & Plan Note (Signed)
Chronic Cont on Metoprolol, Norvasc

## 2022-03-13 ENCOUNTER — Ambulatory Visit (HOSPITAL_COMMUNITY): Payer: Medicare HMO | Attending: Physician Assistant

## 2022-03-13 DIAGNOSIS — I447 Left bundle-branch block, unspecified: Secondary | ICD-10-CM | POA: Diagnosis not present

## 2022-03-13 LAB — ECHOCARDIOGRAM COMPLETE
Area-P 1/2: 2.66 cm2
S' Lateral: 2.9 cm

## 2022-03-14 ENCOUNTER — Other Ambulatory Visit: Payer: Self-pay

## 2022-03-14 DIAGNOSIS — E785 Hyperlipidemia, unspecified: Secondary | ICD-10-CM

## 2022-03-24 ENCOUNTER — Telehealth: Payer: Self-pay

## 2022-03-24 NOTE — Telephone Encounter (Signed)
Patient is returning your call. Please advise. ?

## 2022-03-24 NOTE — Telephone Encounter (Addendum)
Left voice message for patient to call office back for Echo results. Will try calling again.  ----- Message from Almyra Deforest, [place] sent at 03/17/2022  2:28 PM EST ----- Normal pumping function of heart, normal right heart pressure, no significant valve issue. Echocardiogram obtained as recent EKG obtained during annual follow up showed new left bundle branch block, Mrs. Ginsberg was completely asymptomatic with new LBBB. Overall, normal echo.

## 2022-03-25 NOTE — Telephone Encounter (Addendum)
The patient has been notified of the result and verbalized understanding.  All questions (if any) were answered. Will also mail the results to patient as requested.  Jacqulynn Cadet, [place] 03/25/2022 4:41 PM

## 2022-04-03 ENCOUNTER — Ambulatory Visit: Payer: Medicare HMO | Attending: Internal Medicine | Admitting: *Deleted

## 2022-04-03 DIAGNOSIS — I480 Paroxysmal atrial fibrillation: Secondary | ICD-10-CM

## 2022-04-03 DIAGNOSIS — Z7901 Long term (current) use of anticoagulants: Secondary | ICD-10-CM

## 2022-04-03 LAB — POCT INR: INR: 2.8 (ref 2.0–3.0)

## 2022-04-03 NOTE — Patient Instructions (Addendum)
Description   Continue taking Warfarin 1 tablet daily.  Repeat INR in 6 weeks. Call [phone] with an warfarin related questions.  *Consistent amount of greens every week*

## 2022-04-24 ENCOUNTER — Other Ambulatory Visit: Payer: Self-pay | Admitting: Internal Medicine

## 2022-05-15 ENCOUNTER — Ambulatory Visit: Payer: Medicare HMO | Attending: Internal Medicine

## 2022-05-15 DIAGNOSIS — Z7901 Long term (current) use of anticoagulants: Secondary | ICD-10-CM

## 2022-05-15 DIAGNOSIS — I480 Paroxysmal atrial fibrillation: Secondary | ICD-10-CM | POA: Diagnosis not present

## 2022-05-15 LAB — POCT INR: INR: 2.3 (ref 2.0–3.0)

## 2022-05-15 NOTE — Patient Instructions (Signed)
Description   Continue taking Warfarin 1 tablet daily.   Repeat INR in 7 weeks.  Call [phone] with an warfarin related questions. *Consistent amount of greens every week*

## 2022-06-05 ENCOUNTER — Other Ambulatory Visit: Payer: Self-pay | Admitting: Internal Medicine

## 2022-06-10 ENCOUNTER — Other Ambulatory Visit: Payer: Self-pay

## 2022-06-10 ENCOUNTER — Ambulatory Visit (INDEPENDENT_AMBULATORY_CARE_PROVIDER_SITE_OTHER): Payer: Medicare HMO | Admitting: Internal Medicine

## 2022-06-10 ENCOUNTER — Encounter: Payer: Self-pay | Admitting: Internal Medicine

## 2022-06-10 VITALS — BP 129/80 | HR 63 | Temp 97.6°F | Ht 66.0 in | Wt 193.0 lb

## 2022-06-10 DIAGNOSIS — M25562 Pain in left knee: Secondary | ICD-10-CM | POA: Diagnosis not present

## 2022-06-10 DIAGNOSIS — Z7901 Long term (current) use of anticoagulants: Secondary | ICD-10-CM | POA: Diagnosis not present

## 2022-06-10 DIAGNOSIS — E119 Type 2 diabetes mellitus without complications: Secondary | ICD-10-CM

## 2022-06-10 DIAGNOSIS — G8929 Other chronic pain: Secondary | ICD-10-CM | POA: Diagnosis not present

## 2022-06-10 DIAGNOSIS — H9202 Otalgia, left ear: Secondary | ICD-10-CM | POA: Diagnosis not present

## 2022-06-10 DIAGNOSIS — E876 Hypokalemia: Secondary | ICD-10-CM | POA: Diagnosis not present

## 2022-06-10 DIAGNOSIS — E785 Hyperlipidemia, unspecified: Secondary | ICD-10-CM

## 2022-06-10 LAB — COMPREHENSIVE METABOLIC PANEL
ALT: 12 U/L (ref 0–35)
AST: 19 U/L (ref 0–37)
Albumin: 4 g/dL (ref 3.5–5.2)
Alkaline Phosphatase: 63 U/L (ref 39–117)
BUN: 19 mg/dL (ref 6–23)
CO2: 29 mEq/L (ref 19–32)
Calcium: 9.5 mg/dL (ref 8.4–10.5)
Chloride: 100 mEq/L (ref 96–112)
Creatinine, Ser: 1.08 mg/dL (ref 0.40–1.20)
GFR: 46.78 mL/min — ABNORMAL LOW (ref >60.00)
Glucose, Bld: 138 mg/dL — ABNORMAL HIGH (ref 70–99)
Potassium: 4 mEq/L (ref 3.5–5.1)
Sodium: 135 mEq/L (ref 135–145)
Total Bilirubin: 0.6 mg/dL (ref 0.2–1.2)
Total Protein: 7.3 g/dL (ref 6.0–8.3)

## 2022-06-10 LAB — HEMOGLOBIN A1C: Hgb A1c MFr Bld: 7.4 % — ABNORMAL HIGH (ref 4.6–6.5)

## 2022-06-10 MED ORDER — METHYLPREDNISOLONE ACETATE 40 MG/ML IJ SUSP
40.0000 mg | Freq: Once | INTRAMUSCULAR | Status: AC
  Administered 2022-06-10: 40 mg via INTRAMUSCULAR

## 2022-06-10 NOTE — Assessment & Plan Note (Signed)
Chronic B wax Use Debrox RTC to irrigate if needed

## 2022-06-10 NOTE — Assessment & Plan Note (Signed)
Bleeding risks discussed

## 2022-06-10 NOTE — Patient Instructions (Addendum)
Postprocedure instructions :    A Band-Aid should be left on for 12 hours. Injection therapy is not a cure itself. It is used in conjunction with other modalities. You can use nonsteroidal anti-inflammatories like ibuprofen , hot and cold compresses. Rest is recommended in the next 24 hours. You need to report immediately  if fever, chills or any signs of infection develop.  Use Debrox

## 2022-06-10 NOTE — Progress Notes (Signed)
Subjective:  Patient ID: [name], female    DOB: [birth year]  Age: 88 y.o. MRN: [id number]  CC: Follow-up (Been having a ear ache )   HPI [name] presents for HTN, OA, anxiety C/o L earache C/o left knee pain - asking for an injection   Outpatient Medications Prior to Visit  Medication Sig Dispense Refill   Accu-Chek Softclix Lancets lancets USE AS INSTRUCTED 100 each 3   acetaminophen (TYLENOL) 500 MG tablet Take 500 mg by mouth every 6 (six) hours as needed.     Alcohol Swabs (DROPSAFE ALCOHOL PREP) 70 % PADS USE TO CLEAN INJECTION SITE TO CHECK BLOOD SUGARS DAILY 100 each 3   ALPRAZolam (XANAX) 0.5 MG tablet TAKE 1/2 TO 1 TABLET BY MOUTH THREE TIMES DAILY AS NEEDED FOR ANXIETY. DO NOT TAKE WITH TRAMADOL.THIS IS A 30 DAY SUPPLY 90 tablet 3   amLODipine (NORVASC) 5 MG tablet TAKE 1 TABLET(5 MG) BY MOUTH DAILY 90 tablet 3   aspirin 81 MG tablet Take 81 mg by mouth daily.     Cholecalciferol (VITAMIN D3) 50 MCG (2000 UT) capsule TAKE 1 CAPSULE BY MOUTH EVERY DAY 100 capsule 3   diclofenac Sodium (VOLTAREN) 1 % GEL Apply 2 g topically 4 (four) times daily. 150 g 1   furosemide (LASIX) 20 MG tablet TAKE 1 TABLET(20 MG) BY MOUTH DAILY AS NEEDED FOR SWELLING 90 tablet 3   glucose blood (ACCU-CHEK AVIVA PLUS) test strip USE TO CHECK BLOOD SUGARS DAILY 100 strip 3   hydroxypropyl methylcellulose (ISOPTO TEARS) 2.5 % ophthalmic solution Place 1 drop into both eyes 2 (two) times daily.     hyoscyamine (LEVSIN) 0.125 MG tablet TAKE 1 TABLET BY MOUTH EVERY 6 HOURS AS NEEDED FOR UPTO 10 DAYS FOR CRAMPING 360 tablet 0   isosorbide mononitrate (IMDUR) 30 MG 24 hr tablet TAKE 1 TABLET BY MOUTH EVERY DAY 90 tablet 3   metoprolol succinate (TOPROL-XL) 50 MG 24 hr tablet TAKE 1 AND 1/2 TABLETS BY MOUTH TWICE DAILY 270 tablet 3   pantoprazole (PROTONIX) 40 MG tablet TAKE 1 TABLET BY MOUTH EVERY DAY 90 tablet 1   simvastatin (ZOCOR) 20 MG tablet TAKE 1 TABLET BY MOUTH AT BEDTIME 90 tablet  1   traMADol (ULTRAM) 50 MG tablet Take 1 tablet (50 mg total) by mouth every 12 (twelve) hours as needed. 30 tablet 2   warfarin (COUMADIN) 5 MG tablet TAKE 1/2 TO 1 TABLET BY MOUTH DAILY AS DIRECTED BY COUMADIN CLINIC 90 tablet 1   No facility-administered medications prior to visit.    ROS: Review of Systems  Constitutional:  Negative for activity change, appetite change, chills, fatigue and unexpected weight change.  HENT:  Positive for ear pain. Negative for congestion, mouth sores and sinus pressure.   Eyes:  Negative for visual disturbance.  Respiratory:  Negative for cough and chest tightness.   Gastrointestinal:  Negative for abdominal pain and nausea.  Genitourinary:  Negative for difficulty urinating, frequency and vaginal pain.  Musculoskeletal:  Positive for arthralgias and gait problem. Negative for back pain.  Skin:  Negative for pallor and rash.  Neurological:  Negative for dizziness, tremors, weakness, numbness and headaches.  Psychiatric/Behavioral:  Negative for confusion, sleep disturbance and suicidal ideas. The patient is not nervous/anxious.     Objective:  BP 129/80   Pulse 63   Temp 97.6 F (36.4 C) (Oral)   Ht '5\' 6"'$  (1.676 m)   Wt 193 lb (87.5 kg)  SpO2 98%   BMI 31.15 kg/m   BP Readings from Last 3 Encounters:  06/10/22 129/80  03/11/22 128/78  02/18/22 132/68    Wt Readings from Last 3 Encounters:  06/10/22 193 lb (87.5 kg)  03/11/22 189 lb (85.7 kg)  03/03/22 188 lb (85.3 kg)    Physical Exam Constitutional:      General: She is not in acute distress.    Appearance: She is well-developed. She is obese.  HENT:     Head: Normocephalic.     Right Ear: External ear normal.     Left Ear: External ear normal.     Nose: Nose normal.  Eyes:     General:        Right eye: No discharge.        Left eye: No discharge.     Conjunctiva/sclera: Conjunctivae normal.     Pupils: Pupils are equal, round, and reactive to light.  Neck:      Thyroid: No thyromegaly.     Vascular: No JVD.     Trachea: No tracheal deviation.  Cardiovascular:     Rate and Rhythm: Normal rate and regular rhythm.     Heart sounds: Normal heart sounds.  Pulmonary:     Effort: No respiratory distress.     Breath sounds: No stridor. No wheezing.  Abdominal:     General: Bowel sounds are normal. There is no distension.     Palpations: Abdomen is soft. There is no mass.     Tenderness: There is no abdominal tenderness. There is no guarding or rebound.  Musculoskeletal:        General: Tenderness present.     Cervical back: Normal range of motion and neck supple. No rigidity.  Lymphadenopathy:     Cervical: No cervical adenopathy.  Skin:    Findings: No erythema or rash.  Neurological:     Cranial Nerves: No cranial nerve deficit.     Motor: No abnormal muscle tone.     Coordination: Coordination normal.     Deep Tendon Reflexes: Reflexes normal.  Psychiatric:        Behavior: Behavior normal.        Thought Content: Thought content normal.        Judgment: Judgment normal.   B wax - dry  L knee w/pain      Procedure Note :     Procedure : Joint Injection, L  knee   Indication:  Joint osteoarthritis with refractory  chronic pain.   Risks including unsuccessful procedure , bleeding, infection, bruising, skin atrophy, "steroid flare-up" and others were explained to the patient in detail as well as the benefits. Informed consent was obtained verbally.  Tthe patient was placed in a comfortable position. Lateral approach was used. Skin was prepped with Betadine and alcohol  and anesthetized a cooling spray. Then, a 5 cc syringe with a 1.5 inch long 25-gauge needle was used for a joint injection.. The needle was advanced  Into the knee joint cavity. I aspirated a small amount of intra-articular fluid to confirm correct placement of the needle and injected the joint with 5 mL of 2% lidocaine and 40 mg of Depo-Medrol .  Band-Aid was  applied.   Tolerated well. Complications: None. Good pain relief following the procedure.   Postprocedure instructions :    A Band-Aid should be left on for 12 hours. Injection therapy is not a cure itself. It is used in conjunction with other modalities. You can use  nonsteroidal anti-inflammatories like ibuprofen , hot and cold compresses. Rest is recommended in the next 24 hours. You need to report immediately  if fever, chills or any signs of infection develop.   Lab Results  Component Value Date   WBC 4.5 02/05/2021   HGB 12.3 02/05/2021   HCT 37.6 02/05/2021   PLT 201.0 02/05/2021   GLUCOSE 112 (H) 03/11/2022   CHOL 184 02/27/2022   TRIG 92 02/27/2022   HDL 70 02/27/2022   LDLDIRECT 105.5 11/26/2010   LDLCALC 97 02/27/2022   ALT 12 03/11/2022   AST 18 03/11/2022   NA 138 03/11/2022   K 4.1 03/11/2022   CL 103 03/11/2022   CREATININE 1.01 03/11/2022   BUN 17 03/11/2022   CO2 31 03/11/2022   TSH 0.89 03/11/2022   INR 2.3 05/15/2022   HGBA1C 7.5 (H) 03/11/2022    MM 3D SCREEN BREAST BILATERAL  Result Date: 01/20/2022 CLINICAL DATA:  Screening. EXAM: DIGITAL SCREENING BILATERAL MAMMOGRAM WITH TOMOSYNTHESIS AND CAD TECHNIQUE: Bilateral screening digital craniocaudal and mediolateral oblique mammograms were obtained. Bilateral screening digital breast tomosynthesis was performed. The images were evaluated with computer-aided detection. COMPARISON:  Previous exam(s). ACR Breast Density Category c: The breast tissue is heterogeneously dense, which may obscure small masses. FINDINGS: There are no findings suspicious for malignancy. IMPRESSION: No mammographic evidence of malignancy. A result letter of this screening mammogram will be mailed directly to the patient. RECOMMENDATION: Screening mammogram in one year. (Code:SM-B-01Y) BI-RADS CATEGORY  1: Negative. Electronically Signed   By: Abelardo Diesel M.D.   On: 01/20/2022 15:07    Assessment & Plan:   Problem List Items Addressed  This Visit       Endocrine   DM2 (diabetes mellitus, type 2) ([place])   Relevant Orders   Comprehensive metabolic panel   Hemoglobin A1c     Other   Long term (current) use of anticoagulants    Bleeding risks discussed      Knee pain, chronic - Primary    L knee - worse Options discussed Will inject. Bleeding risk discussed      Hypokalemia    Check CMET         No orders of the defined types were placed in this encounter.     Follow-up: Return in about 3 months (around 09/08/2022) for a follow-up visit.  Walker Kehr, MD

## 2022-06-10 NOTE — Assessment & Plan Note (Addendum)
L knee - worse Options discussed Will inject. Bleeding risk discussed

## 2022-06-10 NOTE — Assessment & Plan Note (Signed)
Check CMET. 

## 2022-06-12 ENCOUNTER — Ambulatory Visit: Payer: Medicare HMO | Admitting: Internal Medicine

## 2022-06-17 DIAGNOSIS — E785 Hyperlipidemia, unspecified: Secondary | ICD-10-CM | POA: Diagnosis not present

## 2022-06-18 LAB — LIPID PANEL
Chol/HDL Ratio: 2.3 ratio (ref 0.0–4.4)
Cholesterol, Total: 184 mg/dL (ref 100–199)
HDL: 79 mg/dL (ref >39)
LDL Chol Calc (NIH): 90 mg/dL (ref 0–99)
Triglycerides: 85 mg/dL (ref 0–149)
VLDL Cholesterol Cal: 15 mg/dL (ref 5–40)

## 2022-06-19 ENCOUNTER — Other Ambulatory Visit: Payer: Self-pay | Admitting: Internal Medicine

## 2022-06-20 ENCOUNTER — Telehealth: Payer: Self-pay | Admitting: Internal Medicine

## 2022-06-20 ENCOUNTER — Telehealth: Payer: Self-pay

## 2022-06-20 MED ORDER — SIMVASTATIN 40 MG PO TABS: 40.0000 mg | ORAL_TABLET | Freq: Every day | ORAL | 1 refills | Status: DC

## 2022-06-20 NOTE — Telephone Encounter (Signed)
Pt is returning call about results, pt requested that if you can not reach her on home phone to please call her cell phone at [phone]

## 2022-06-20 NOTE — Addendum Note (Signed)
Addended by: Jacqulynn Cadet on: 06/20/2022 05:10 PM   Modules accepted: Orders

## 2022-06-20 NOTE — Telephone Encounter (Addendum)
Left voice message for patient to give office a call back for results. Will call again  ----- Message from [place], [place] sent at 06/19/2022  5:34 PM EST ----- LDL remain high at 90 despite a trial of diet and exercise, goal <70, would recommend increase zocor/simvastatin to '40mg'$  daily, and repeat FLP and LFT in 3 month

## 2022-06-28 ENCOUNTER — Other Ambulatory Visit: Payer: Self-pay | Admitting: Internal Medicine

## 2022-06-30 ENCOUNTER — Telehealth: Payer: Self-pay | Admitting: Internal Medicine

## 2022-06-30 NOTE — Telephone Encounter (Signed)
Pt is returning call and is requesting return call.  

## 2022-06-30 NOTE — Telephone Encounter (Signed)
Pt c/o medication issue:  1. Name of Medication:   simvastatin (ZOCOR) 40 MG tablet   2. How are you currently taking this medication (dosage and times per day)?   As prescribed  3. Are you having a reaction (difficulty breathing--STAT)?   4. What is your medication issue?   Patient states this medication is making her dizzy, causing her heart to flutter and is making her nervous.  Patient states this medicine is not agreeing with her after the dosage of this medication was increased.  Patient wants to know next steps.

## 2022-06-30 NOTE — Telephone Encounter (Signed)
Called patient back, advised we did not have a response yet as MD was in clinic today. We would call once we had a response.   Patient verbalized understanding, she states she is going to take 20 mg of zocor tonight and not 40 mg.

## 2022-06-30 NOTE — Telephone Encounter (Signed)
Patient returned call-   She states since her Zocor increase she states it has made her nervous feeling, giving palpitations (feeling like her heart is beating fast) and causing dizziness.   Patient states she picked it up over the weekend, took it two days and had these symptoms after starting this.   She would like to know if she can decrease her dose back.   Will route to MD to review. Thanks!

## 2022-06-30 NOTE — Telephone Encounter (Signed)
I attempted to contact patient, was talking to patient when it got disconnected, I attempted to contact x2 and was not able to reach her due to a busy signal.   Will try again.

## 2022-07-01 ENCOUNTER — Ambulatory Visit: Payer: Medicare HMO | Attending: Cardiovascular Disease | Admitting: *Deleted

## 2022-07-01 ENCOUNTER — Other Ambulatory Visit: Payer: Self-pay

## 2022-07-01 DIAGNOSIS — I480 Paroxysmal atrial fibrillation: Secondary | ICD-10-CM

## 2022-07-01 DIAGNOSIS — E785 Hyperlipidemia, unspecified: Secondary | ICD-10-CM

## 2022-07-01 DIAGNOSIS — Z7901 Long term (current) use of anticoagulants: Secondary | ICD-10-CM

## 2022-07-01 LAB — POCT INR: INR: 1.9 — AB (ref 2.0–3.0)

## 2022-07-01 MED ORDER — EZETIMIBE 10 MG PO TABS: 10.0000 mg | ORAL_TABLET | Freq: Every day | ORAL | 3 refills | Status: DC

## 2022-07-01 MED ORDER — SIMVASTATIN 20 MG PO TABS: 20.0000 mg | ORAL_TABLET | Freq: Every day | ORAL | 3 refills | Status: DC

## 2022-07-01 NOTE — Telephone Encounter (Signed)
I spoke with patient, advised of message from MD- patient verbalized understanding.   RX updated and sent to pharmacy.

## 2022-07-01 NOTE — Telephone Encounter (Signed)
Called patient, LVM to call back to discus recommendations from MD.  Left call back number.

## 2022-07-01 NOTE — Patient Instructions (Signed)
Description   Today take 1.5 tablets of warfarin then continue taking Warfarin 1 tablet daily. Repeat INR in 6 weeks. Call [phone] with an warfarin related questions. *Consistent amount of greens every week*

## 2022-07-03 ENCOUNTER — Ambulatory Visit (INDEPENDENT_AMBULATORY_CARE_PROVIDER_SITE_OTHER): Payer: Medicare HMO | Admitting: Internal Medicine

## 2022-07-03 VITALS — BP 118/80 | HR 62 | Temp 98.6°F | Ht 66.0 in | Wt 188.0 lb

## 2022-07-03 DIAGNOSIS — H6123 Impacted cerumen, bilateral: Secondary | ICD-10-CM

## 2022-07-03 DIAGNOSIS — E119 Type 2 diabetes mellitus without complications: Secondary | ICD-10-CM | POA: Diagnosis not present

## 2022-07-03 NOTE — Progress Notes (Signed)
Subjective:  Patient ID: [name], female    DOB: [birth year]  Age: 88 y.o. MRN: [id number]  CC: Ear Pain   HPI [name] presents for R earache of several days duration, better now. No hypoglycemia  Outpatient Medications Prior to Visit  Medication Sig Dispense Refill   Accu-Chek Softclix Lancets lancets USE AS INSTRUCTED 100 each 3   acetaminophen (TYLENOL) 500 MG tablet Take 500 mg by mouth every 6 (six) hours as needed.     Alcohol Swabs (DROPSAFE ALCOHOL PREP) 70 % PADS USE TO CLEAN INJECTION SITE TO CHECK BLOOD SUGARS DAILY 100 each 3   ALPRAZolam (XANAX) 0.5 MG tablet TAKE 1/2 TO 1 TABLET BY MOUTH THREE TIMES DAILY AS NEEDED FOR ANXIETY. DO NOT TAKE WITH TRAMADOL.THIS IS A 30 DAY SUPPLY 90 tablet 3   amLODipine (NORVASC) 5 MG tablet TAKE 1 TABLET(5 MG) BY MOUTH DAILY 90 tablet 3   aspirin 81 MG tablet Take 81 mg by mouth daily.     Cholecalciferol (VITAMIN D3) 50 MCG (2000 UT) capsule TAKE 1 CAPSULE BY MOUTH EVERY DAY 100 capsule 3   diclofenac Sodium (VOLTAREN) 1 % GEL Apply 2 g topically 4 (four) times daily. 150 g 1   ezetimibe (ZETIA) 10 MG tablet Take 1 tablet (10 mg total) by mouth daily. 90 tablet 3   furosemide (LASIX) 20 MG tablet TAKE 1 TABLET(20 MG) BY MOUTH DAILY AS NEEDED FOR SWELLING 90 tablet 3   glucose blood (ACCU-CHEK AVIVA PLUS) test strip USE TO CHECK BLOOD SUGARS DAILY 100 strip 3   hydroxypropyl methylcellulose (ISOPTO TEARS) 2.5 % ophthalmic solution Place 1 drop into both eyes 2 (two) times daily.     hydrOXYzine (ATARAX) 25 MG tablet Take by mouth.     hyoscyamine (LEVSIN) 0.125 MG tablet TAKE 1 TABLET BY MOUTH EVERY 6 HOURS AS NEEDED FOR UPTO 10 DAYS FOR CRAMPING 360 tablet 0   isosorbide mononitrate (IMDUR) 30 MG 24 hr tablet TAKE 1 TABLET BY MOUTH EVERY DAY 90 tablet 3   metoprolol succinate (TOPROL-XL) 50 MG 24 hr tablet TAKE 1 AND 1/2 TABLETS BY MOUTH TWICE DAILY 270 tablet 3   pantoprazole (PROTONIX) 40 MG tablet TAKE 1 TABLET BY MOUTH  EVERY DAY 90 tablet 1   simvastatin (ZOCOR) 20 MG tablet Take 1 tablet (20 mg total) by mouth at bedtime. 90 tablet 3   traMADol (ULTRAM) 50 MG tablet Take 1 tablet (50 mg total) by mouth every 12 (twelve) hours as needed. 30 tablet 2   warfarin (COUMADIN) 5 MG tablet TAKE 1/2 TO 1 TABLET BY MOUTH DAILY AS DIRECTED BY COUMADIN CLINIC 90 tablet 1   No facility-administered medications prior to visit.    ROS: Review of Systems  Constitutional:  Negative for activity change, appetite change, chills, fatigue and unexpected weight change.  HENT:  Positive for hearing loss. Negative for congestion, mouth sores and sinus pressure.   Eyes:  Negative for visual disturbance.  Respiratory:  Negative for cough and chest tightness.   Gastrointestinal:  Negative for abdominal pain and nausea.  Genitourinary:  Negative for difficulty urinating, frequency and vaginal pain.  Musculoskeletal:  Negative for back pain and gait problem.  Skin:  Negative for pallor and rash.  Neurological:  Negative for dizziness, tremors, weakness, numbness and headaches.  Psychiatric/Behavioral:  Negative for confusion and sleep disturbance.     Objective:  BP 118/80 (BP Location: Left Arm, Patient Position: Sitting, Cuff Size: Large)   Pulse 62  Temp 98.6 F (37 C) (Oral)   Ht 5\' 6"  (1.676 m)   Wt 188 lb (85.3 kg)   SpO2 98%   BMI 30.34 kg/m   BP Readings from Last 3 Encounters:  07/03/22 118/80  06/10/22 129/80  03/11/22 128/78    Wt Readings from Last 3 Encounters:  07/03/22 188 lb (85.3 kg)  06/10/22 193 lb (87.5 kg)  03/11/22 189 lb (85.7 kg)    Physical Exam Constitutional:      General: She is not in acute distress.    Appearance: She is well-developed. She is obese.  HENT:     Head: Normocephalic.     Right Ear: External ear normal.     Left Ear: External ear normal.     Nose: Nose normal.  Eyes:     General:        Right eye: No discharge.        Left eye: No discharge.      Conjunctiva/sclera: Conjunctivae normal.     Pupils: Pupils are equal, round, and reactive to light.  Neck:     Thyroid: No thyromegaly.     Vascular: No JVD.     Trachea: No tracheal deviation.  Cardiovascular:     Rate and Rhythm: Normal rate and regular rhythm.     Heart sounds: Normal heart sounds.  Pulmonary:     Effort: No respiratory distress.     Breath sounds: No stridor. No wheezing.  Abdominal:     General: Bowel sounds are normal. There is no distension.     Palpations: Abdomen is soft. There is no mass.     Tenderness: There is no abdominal tenderness. There is no guarding or rebound.  Musculoskeletal:        General: No tenderness.     Cervical back: Normal range of motion and neck supple. No rigidity.  Lymphadenopathy:     Cervical: No cervical adenopathy.  Skin:    Findings: No erythema or rash.  Neurological:     Cranial Nerves: No cranial nerve deficit.     Motor: No abnormal muscle tone.     Coordination: Coordination normal.     Deep Tendon Reflexes: Reflexes normal.  Psychiatric:        Behavior: Behavior normal.        Thought Content: Thought content normal.        Judgment: Judgment normal.      Wax impaction in both ears   Procedure Note :     Procedure :  Ear irrigation right and left ears   Indication:  Cerumen impaction right and left ears   Risks, including pain, dizziness, eardrum perforation, bleeding, infection and others as well as benefits were explained to the patient in detail. Verbal consent was obtained and the patient agreed to proceed.    We used "The Elephant Ear Irrigation Device" filled with lukewarm water for irrigation. A large amount wax was recovered from both ears. Procedure has also required manual wax removal/instrumentation with an ear wax curette and ear forceps on the right and left ears.   Tolerated well. Complications: None.   Postprocedure instructions :  Call if problems.  Lab Results  Component Value Date    WBC 4.5 02/05/2021   HGB 12.3 02/05/2021   HCT 37.6 02/05/2021   PLT 201.0 02/05/2021   GLUCOSE 138 (H) 06/10/2022   CHOL 184 06/17/2022   TRIG 85 06/17/2022   HDL 79 06/17/2022   LDLDIRECT 105.5 11/26/2010  LDLCALC 90 06/17/2022   ALT 12 06/10/2022   AST 19 06/10/2022   NA 135 06/10/2022   K 4.0 06/10/2022   CL 100 06/10/2022   CREATININE 1.08 06/10/2022   BUN 19 06/10/2022   CO2 29 06/10/2022   TSH 0.89 03/11/2022   INR 1.9 (A) 07/01/2022   HGBA1C 7.4 (H) 06/10/2022    MM 3D SCREEN BREAST BILATERAL  Result Date: 01/20/2022 CLINICAL DATA:  Screening. EXAM: DIGITAL SCREENING BILATERAL MAMMOGRAM WITH TOMOSYNTHESIS AND CAD TECHNIQUE: Bilateral screening digital craniocaudal and mediolateral oblique mammograms were obtained. Bilateral screening digital breast tomosynthesis was performed. The images were evaluated with computer-aided detection. COMPARISON:  Previous exam(s). ACR Breast Density Category c: The breast tissue is heterogeneously dense, which may obscure small masses. FINDINGS: There are no findings suspicious for malignancy. IMPRESSION: No mammographic evidence of malignancy. A result letter of this screening mammogram will be mailed directly to the patient. RECOMMENDATION: Screening mammogram in one year. (Code:SM-B-01Y) BI-RADS CATEGORY  1: Negative. Electronically Signed   By: Abelardo Diesel M.D.   On: 01/20/2022 15:07    Assessment & Plan:   Problem List Items Addressed This Visit       Endocrine   DM2 (diabetes mellitus, type 2) ([place])    None diet        Nervous and Auditory   Cerumen impaction    Options to treat discussed. The patient asked [place] to irrigate the ears      Other Visit Diagnoses     Impacted cerumen of both ears    -  Primary   Relevant Orders   Ear Lavage         No orders of the defined types were placed in this encounter.     Follow-up: Return for a follow-up visit.  Walker Kehr, MD

## 2022-07-06 ENCOUNTER — Encounter: Payer: Self-pay | Admitting: Internal Medicine

## 2022-07-06 NOTE — Assessment & Plan Note (Signed)
None diet

## 2022-07-06 NOTE — Assessment & Plan Note (Signed)
Options to treat discussed. The patient asked [place] to irrigate the ears

## 2022-07-18 ENCOUNTER — Telehealth: Payer: Self-pay | Admitting: Internal Medicine

## 2022-07-18 NOTE — Telephone Encounter (Signed)
Spoke with patient of Dr. Rennis Golden. She reports zetia is making her heart beat fast and dizzy She called in on 3/18 with similar issues to simvastatin 40mg  daily. The last few days she has been taking simvastatin 20mg  only. She reports her fluttering has improved. She has know PAF/SVT. Advised her symptoms could be an exacerbation of this(?)  Last SBP 123 Does not know HR    Advised will send message to [facility] MD. Suggested that she monitor her BP and pulse over the weekend and be able to provide that info on Monday, when we call back.

## 2022-07-18 NOTE — Telephone Encounter (Signed)
Pt c/o medication issue:  1. Name of Medication: ezetimibe (ZETIA) 10 MG tablet   2. How are you currently taking this medication (dosage and times per day)?   Take 1 tablet (10 mg total) by mouth daily.    3. Are you having a reaction (difficulty breathing--STAT)? No  4. What is your medication issue? Pt would like a callback regarding this medication making her feel dizzy and a little shaky. Please advise.

## 2022-07-21 NOTE — Telephone Encounter (Signed)
Spoke with patient who reports she did well over the weekend. She did NOT take zetia. She is doing fine on the simvastatin 20mg  QHS (had SE on simvastatin 40mg )  Vitals: 147/72 - HR 61 143/84 - HR 65 135/84 - HR 65  Routed to MD to review.  Last LDL was 90

## 2022-07-21 NOTE — Telephone Encounter (Signed)
Left message to call back for update on how she is doing Message from MD below. Patient already decreased statin per 06/30/22 phone note.  Will await patient update for plan.    Chrystie Nose, MD  to Me     07/21/22  8:22 AM May be afib or SVT - however, she can cut back her simvastatin to 20 mg daily.  Dr Rexene Edison

## 2022-07-21 NOTE — Telephone Encounter (Signed)
Patient is returning call. Transferred to [place], Charity fundraiser.

## 2022-07-22 NOTE — Telephone Encounter (Signed)
Spoke with patient. Advised MD did not suggest any other med changes. She will continue simvastatin 20mg  QHS. Advised to call if persistent, worsening "heart beating fast".   Mailed her lipid diet tip sheets

## 2022-08-05 ENCOUNTER — Telehealth: Payer: Self-pay | Admitting: Internal Medicine

## 2022-08-05 NOTE — Telephone Encounter (Signed)
  Pt c/o medication issue:  1. Name of Medication: simvastatin (ZOCOR) 20 MG tablet   2. How are you currently taking this medication (dosage and times per day)?   Take 1 tablet (20 mg total) by mouth at bedtime.    3. Are you having a reaction (difficulty breathing--STAT)? No   4. What is your medication issue? Pt would like to speak with Dr. Blanchie Dessert nurse regarding this medication

## 2022-08-05 NOTE — Telephone Encounter (Signed)
Patient was calling to get refill on simvastatin. Informed patient she has 3 refills left on medication. She just need to call her pharmacy

## 2022-08-12 ENCOUNTER — Ambulatory Visit: Payer: Medicare HMO | Attending: Internal Medicine

## 2022-08-12 DIAGNOSIS — Z7901 Long term (current) use of anticoagulants: Secondary | ICD-10-CM

## 2022-08-12 DIAGNOSIS — I480 Paroxysmal atrial fibrillation: Secondary | ICD-10-CM

## 2022-08-12 LAB — POCT INR: INR: 2.5 (ref 2.0–3.0)

## 2022-08-12 NOTE — Patient Instructions (Signed)
Description   Continue taking Warfarin 1 tablet daily.  Repeat INR in 6 weeks. Call [phone] with an warfarin related questions.  *Consistent amount of greens every week*       

## 2022-09-04 ENCOUNTER — Encounter: Payer: Self-pay | Admitting: Internal Medicine

## 2022-09-04 DIAGNOSIS — H04123 Dry eye syndrome of bilateral lacrimal glands: Secondary | ICD-10-CM | POA: Diagnosis not present

## 2022-09-04 DIAGNOSIS — H02831 Dermatochalasis of right upper eyelid: Secondary | ICD-10-CM | POA: Diagnosis not present

## 2022-09-04 DIAGNOSIS — H02834 Dermatochalasis of left upper eyelid: Secondary | ICD-10-CM | POA: Diagnosis not present

## 2022-09-04 DIAGNOSIS — E119 Type 2 diabetes mellitus without complications: Secondary | ICD-10-CM | POA: Diagnosis not present

## 2022-09-04 DIAGNOSIS — H25813 Combined forms of age-related cataract, bilateral: Secondary | ICD-10-CM | POA: Diagnosis not present

## 2022-09-04 DIAGNOSIS — H353121 Nonexudative age-related macular degeneration, left eye, early dry stage: Secondary | ICD-10-CM | POA: Diagnosis not present

## 2022-09-04 LAB — HM DIABETES EYE EXAM

## 2022-09-06 ENCOUNTER — Other Ambulatory Visit: Payer: Self-pay | Admitting: Internal Medicine

## 2022-09-06 DIAGNOSIS — I480 Paroxysmal atrial fibrillation: Secondary | ICD-10-CM

## 2022-09-12 ENCOUNTER — Telehealth: Payer: Self-pay

## 2022-09-12 ENCOUNTER — Other Ambulatory Visit: Payer: Self-pay

## 2022-09-12 DIAGNOSIS — E785 Hyperlipidemia, unspecified: Secondary | ICD-10-CM

## 2022-09-12 NOTE — Telephone Encounter (Signed)
Called and left a detailed voice message for the patient reminding her of repeat labs due in June and to be looking for the lab order in the mail and if she has any questions to please give our office a call.   -------The patient has been notified of the result and verbalized understanding.  All questions (if any) were answered. Dorris Fetch, CMA 06/20/2022 5:08 PM   Sent new Rx to patient's pharmacy on file for Simvastatin 40 mg daily and placed order for repeat FLP and LFT due in 3 months----------

## 2022-09-16 ENCOUNTER — Ambulatory Visit (INDEPENDENT_AMBULATORY_CARE_PROVIDER_SITE_OTHER): Payer: Medicare HMO | Admitting: Internal Medicine

## 2022-09-16 ENCOUNTER — Encounter: Payer: Self-pay | Admitting: Internal Medicine

## 2022-09-16 VITALS — BP 110/66 | HR 53 | Temp 98.5°F | Ht 66.0 in | Wt 188.0 lb

## 2022-09-16 DIAGNOSIS — I2510 Atherosclerotic heart disease of native coronary artery without angina pectoris: Secondary | ICD-10-CM

## 2022-09-16 DIAGNOSIS — I10 Essential (primary) hypertension: Secondary | ICD-10-CM | POA: Diagnosis not present

## 2022-09-16 DIAGNOSIS — E7800 Pure hypercholesterolemia, unspecified: Secondary | ICD-10-CM | POA: Diagnosis not present

## 2022-09-16 DIAGNOSIS — I2583 Coronary atherosclerosis due to lipid rich plaque: Secondary | ICD-10-CM | POA: Diagnosis not present

## 2022-09-16 DIAGNOSIS — E119 Type 2 diabetes mellitus without complications: Secondary | ICD-10-CM

## 2022-09-16 DIAGNOSIS — F419 Anxiety disorder, unspecified: Secondary | ICD-10-CM

## 2022-09-16 LAB — COMPREHENSIVE METABOLIC PANEL
ALT: 11 U/L (ref 0–35)
AST: 17 U/L (ref 0–37)
Albumin: 4.2 g/dL (ref 3.5–5.2)
Alkaline Phosphatase: 51 U/L (ref 39–117)
BUN: 14 mg/dL (ref 6–23)
CO2: 31 mEq/L (ref 19–32)
Calcium: 9 mg/dL (ref 8.4–10.5)
Chloride: 100 mEq/L (ref 96–112)
Creatinine, Ser: 1.25 mg/dL — ABNORMAL HIGH (ref 0.40–1.20)
GFR: 39.18 mL/min — ABNORMAL LOW (ref >60.00)
Glucose, Bld: 138 mg/dL — ABNORMAL HIGH (ref 70–99)
Potassium: 4.3 mEq/L (ref 3.5–5.1)
Sodium: 135 mEq/L (ref 135–145)
Total Bilirubin: 0.5 mg/dL (ref 0.2–1.2)
Total Protein: 7.2 g/dL (ref 6.0–8.3)

## 2022-09-16 LAB — LIPID PANEL
Cholesterol: 147 mg/dL (ref 0–200)
HDL: 59.2 mg/dL (ref >39.00)
LDL Cholesterol: 62 mg/dL (ref 0–99)
NonHDL: 87.33
Total CHOL/HDL Ratio: 2
Triglycerides: 128 mg/dL (ref 0.0–149.0)
VLDL: 25.6 mg/dL (ref 0.0–40.0)

## 2022-09-16 LAB — HEMOGLOBIN A1C: Hgb A1c MFr Bld: 7.2 % — ABNORMAL HIGH (ref 4.6–6.5)

## 2022-09-16 MED ORDER — ALPRAZOLAM 0.5 MG PO TABS: ORAL_TABLET | ORAL | 3 refills | Status: DC

## 2022-09-16 NOTE — Progress Notes (Signed)
Subjective:  Patient ID: [name], female    DOB: 1934/09/21  Age: 88 y.o. MRN: [id number]  CC: Follow-up (3 MNTH F/U)   HPI LAYLANNI PEDLEY presents for HTN, OA, anxiety f/u Not taking her cholesterol med  Outpatient Medications Prior to Visit  Medication Sig Dispense Refill   Accu-Chek Softclix Lancets lancets USE AS INSTRUCTED 100 each 3   acetaminophen (TYLENOL) 500 MG tablet Take 500 mg by mouth every 6 (six) hours as needed.     Alcohol Swabs (DROPSAFE ALCOHOL PREP) 70 % PADS USE TO CLEAN INJECTION SITE TO CHECK BLOOD SUGARS DAILY 100 each 3   amLODipine (NORVASC) 5 MG tablet TAKE 1 TABLET(5 MG) BY MOUTH DAILY 90 tablet 3   aspirin 81 MG tablet Take 81 mg by mouth daily.     Cholecalciferol (VITAMIN D3) 50 MCG (2000 UT) capsule TAKE 1 CAPSULE BY MOUTH EVERY DAY 100 capsule 3   diclofenac Sodium (VOLTAREN) 1 % GEL Apply 2 g topically 4 (four) times daily. 150 g 1   furosemide (LASIX) 20 MG tablet TAKE 1 TABLET(20 MG) BY MOUTH DAILY AS NEEDED FOR SWELLING 90 tablet 3   glucose blood (ACCU-CHEK AVIVA PLUS) test strip USE TO CHECK BLOOD SUGARS DAILY 100 strip 3   hydroxypropyl methylcellulose (ISOPTO TEARS) 2.5 % ophthalmic solution Place 1 drop into both eyes 2 (two) times daily.     hydrOXYzine (ATARAX) 25 MG tablet Take by mouth.     hyoscyamine (LEVSIN) 0.125 MG tablet TAKE 1 TABLET BY MOUTH EVERY 6 HOURS AS NEEDED FOR UPTO 10 DAYS FOR CRAMPING 360 tablet 0   isosorbide mononitrate (IMDUR) 30 MG 24 hr tablet TAKE 1 TABLET BY MOUTH EVERY DAY 90 tablet 3   metoprolol succinate (TOPROL-XL) 50 MG 24 hr tablet TAKE 1 AND 1/2 TABLETS BY MOUTH TWICE DAILY 270 tablet 3   pantoprazole (PROTONIX) 40 MG tablet TAKE 1 TABLET BY MOUTH EVERY DAY 90 tablet 1   simvastatin (ZOCOR) 20 MG tablet Take 1 tablet (20 mg total) by mouth at bedtime. 90 tablet 3   traMADol (ULTRAM) 50 MG tablet Take 1 tablet (50 mg total) by mouth every 12 (twelve) hours as needed. 30 tablet 2   warfarin  (COUMADIN) 5 MG tablet TAKE 1/2 TO 1 TABLET BY MOUTH DAILY AS DIRECTED BY COUMADIN CLINIC 90 tablet 1   ALPRAZolam (XANAX) 0.5 MG tablet TAKE 1/2 TO 1 TABLET BY MOUTH THREE TIMES DAILY AS NEEDED FOR ANXIETY. DO NOT TAKE WITH TRAMADOL.THIS IS A 30 DAY SUPPLY 90 tablet 3   No facility-administered medications prior to visit.    ROS: Review of Systems  Constitutional:  Negative for activity change, appetite change, chills, fatigue and unexpected weight change.  HENT:  Negative for congestion, mouth sores and sinus pressure.   Eyes:  Negative for visual disturbance.  Respiratory:  Negative for cough and chest tightness.   Gastrointestinal:  Negative for abdominal pain and nausea.  Genitourinary:  Negative for difficulty urinating, frequency and vaginal pain.  Musculoskeletal:  Positive for arthralgias and gait problem. Negative for back pain.  Skin:  Negative for pallor and rash.  Neurological:  Negative for dizziness, tremors, weakness, numbness and headaches.  Psychiatric/Behavioral:  Negative for confusion, sleep disturbance and suicidal ideas. The patient is nervous/anxious.     Objective:  BP 110/66 (BP Location: Right Arm, Patient Position: Sitting, Cuff Size: Large)   Pulse (!) 53   Temp 98.5 F (36.9 C) (Oral)   Ht 5'  6" (1.676 m)   Wt 188 lb (85.3 kg)   SpO2 98%   BMI 30.34 kg/m   BP Readings from Last 3 Encounters:  09/16/22 110/66  07/03/22 118/80  06/10/22 129/80    Wt Readings from Last 3 Encounters:  09/16/22 188 lb (85.3 kg)  07/03/22 188 lb (85.3 kg)  06/10/22 193 lb (87.5 kg)    Physical Exam Constitutional:      General: She is not in acute distress.    Appearance: She is well-developed. She is obese.  HENT:     Head: Normocephalic.     Right Ear: External ear normal.     Left Ear: External ear normal.     Nose: Nose normal.  Eyes:     General:        Right eye: No discharge.        Left eye: No discharge.     Conjunctiva/sclera: Conjunctivae  normal.     Pupils: Pupils are equal, round, and reactive to light.  Neck:     Thyroid: No thyromegaly.     Vascular: No JVD.     Trachea: No tracheal deviation.  Cardiovascular:     Rate and Rhythm: Normal rate and regular rhythm.     Heart sounds: Normal heart sounds.  Pulmonary:     Effort: No respiratory distress.     Breath sounds: No stridor. No wheezing.  Abdominal:     General: Bowel sounds are normal. There is no distension.     Palpations: Abdomen is soft. There is no mass.     Tenderness: There is no abdominal tenderness. There is no guarding or rebound.  Musculoskeletal:        General: Tenderness present.     Cervical back: Normal range of motion and neck supple. No rigidity.     Right lower leg: No edema.  Lymphadenopathy:     Cervical: No cervical adenopathy.  Skin:    Findings: No erythema or rash.  Neurological:     Mental Status: She is oriented to person, place, and time.     Cranial Nerves: No cranial nerve deficit.     Motor: No abnormal muscle tone.     Coordination: Coordination abnormal.     Gait: Gait abnormal.     Deep Tendon Reflexes: Reflexes normal.  Psychiatric:        Behavior: Behavior normal.        Thought Content: Thought content normal.        Judgment: Judgment normal.   Using a cane Slightly ataxic  Lab Results  Component Value Date   WBC 4.5 02/05/2021   HGB 12.3 02/05/2021   HCT 37.6 02/05/2021   PLT 201.0 02/05/2021   GLUCOSE 138 (H) 06/10/2022   CHOL 184 06/17/2022   TRIG 85 06/17/2022   HDL 79 06/17/2022   LDLDIRECT 105.5 11/26/2010   LDLCALC 90 06/17/2022   ALT 12 06/10/2022   AST 19 06/10/2022   NA 135 06/10/2022   K 4.0 06/10/2022   CL 100 06/10/2022   CREATININE 1.08 06/10/2022   BUN 19 06/10/2022   CO2 29 06/10/2022   TSH 0.89 03/11/2022   INR 2.5 08/12/2022   HGBA1C 7.4 (H) 06/10/2022    MM 3D SCREEN BREAST BILATERAL  Result Date: 01/20/2022 CLINICAL DATA:  Screening. EXAM: DIGITAL SCREENING BILATERAL  MAMMOGRAM WITH TOMOSYNTHESIS AND CAD TECHNIQUE: Bilateral screening digital craniocaudal and mediolateral oblique mammograms were obtained. Bilateral screening digital breast tomosynthesis was performed. The images were  evaluated with computer-aided detection. COMPARISON:  Previous exam(s). ACR Breast Density Category c: The breast tissue is heterogeneously dense, which may obscure small masses. FINDINGS: There are no findings suspicious for malignancy. IMPRESSION: No mammographic evidence of malignancy. A result letter of this screening mammogram will be mailed directly to the patient. RECOMMENDATION: Screening mammogram in one year. (Code:SM-B-01Y) BI-RADS CATEGORY  1: Negative. Electronically Signed   By: Sherian Rein M.D.   On: 01/20/2022 15:07    Assessment & Plan:   Problem List Items Addressed This Visit     HYPERCHOLESTEROLEMIA    Continue with current prescription therapy as reflected on the Med list.       Relevant Orders   Lipid panel   Anxiety - Primary    Cont on Xanax prn  Potential benefits of a long term benzodiazepines  use as well as potential risks  and complications were explained to the patient and were aknowledged. Not to take w/Tramadol      Relevant Medications   ALPRAZolam (XANAX) 0.5 MG tablet   Essential hypertension    Chronic Cont on Metoprolol, Norvasc      DM2 (diabetes mellitus, type 2) ([facility])    On diet      Relevant Orders   Comprehensive metabolic panel   Hemoglobin A1c   CAD (coronary artery disease)    Cont on Simvastatin, Toprol, Isosorbide, Amlodipine         Meds ordered this encounter  Medications   ALPRAZolam (XANAX) 0.5 MG tablet    Sig: TAKE 1/2 TO 1 TABLET BY MOUTH THREE TIMES DAILY AS NEEDED FOR ANXIETY.  DO NOT TAKE WITH TRAMADOL. THIS IS A 30 DAY SUPPLY    Dispense:  90 tablet    Refill:  3      Follow-up: Return in about 4 months (around 01/16/2023) for a follow-up visit.  Sonda Primes, MD

## 2022-09-16 NOTE — Assessment & Plan Note (Signed)
Cont on Simvastatin, Toprol, Isosorbide, Amlodipine °

## 2022-09-16 NOTE — Assessment & Plan Note (Signed)
Cont on Xanax prn ° Potential benefits of a long term benzodiazepines  use as well as potential risks  and complications were explained to the patient and were aknowledged. Not to take w/Tramadol °

## 2022-09-16 NOTE — Assessment & Plan Note (Signed)
  On diet  

## 2022-09-16 NOTE — Assessment & Plan Note (Signed)
Chronic Cont on Metoprolol, Norvasc 

## 2022-09-16 NOTE — Assessment & Plan Note (Signed)
Continue with current prescription therapy as reflected on the Med list.  

## 2022-09-23 ENCOUNTER — Ambulatory Visit: Payer: Medicare HMO | Attending: Internal Medicine

## 2022-09-23 DIAGNOSIS — Z7901 Long term (current) use of anticoagulants: Secondary | ICD-10-CM

## 2022-09-23 DIAGNOSIS — I480 Paroxysmal atrial fibrillation: Secondary | ICD-10-CM

## 2022-09-23 LAB — POCT INR: INR: 3.4 — AB (ref 2.0–3.0)

## 2022-09-23 NOTE — Patient Instructions (Signed)
HOLD TONIGHT ONLY THEN Continue taking Warfarin 1 tablet daily. Repeat INR in 5 weeks. Call [phone] with an warfarin related questions. *Consistent amount of greens every week*

## 2022-09-26 DIAGNOSIS — E785 Hyperlipidemia, unspecified: Secondary | ICD-10-CM | POA: Diagnosis not present

## 2022-09-27 LAB — HEPATIC FUNCTION PANEL
ALT: 11 IU/L (ref 0–32)
AST: 16 IU/L (ref 0–40)
Albumin: 4.3 g/dL (ref 3.7–4.7)
Alkaline Phosphatase: 68 IU/L (ref 44–121)
Bilirubin Total: 0.4 mg/dL (ref 0.0–1.2)
Bilirubin, Direct: 0.13 mg/dL (ref 0.00–0.40)
Total Protein: 6.9 g/dL (ref 6.0–8.5)

## 2022-09-27 LAB — LIPID PANEL
Chol/HDL Ratio: 2.4 ratio (ref 0.0–4.4)
Cholesterol, Total: 161 mg/dL (ref 100–199)
HDL: 68 mg/dL (ref >39)
LDL Chol Calc (NIH): 75 mg/dL (ref 0–99)
Triglycerides: 101 mg/dL (ref 0–149)
VLDL Cholesterol Cal: 18 mg/dL (ref 5–40)

## 2022-10-06 ENCOUNTER — Encounter: Payer: Self-pay | Admitting: Family Medicine

## 2022-10-06 ENCOUNTER — Ambulatory Visit (INDEPENDENT_AMBULATORY_CARE_PROVIDER_SITE_OTHER): Payer: Medicare HMO | Admitting: Family Medicine

## 2022-10-06 VITALS — BP 132/66 | HR 64 | Temp 97.5°F | Resp 20 | Ht 66.0 in | Wt 185.0 lb

## 2022-10-06 DIAGNOSIS — M549 Dorsalgia, unspecified: Secondary | ICD-10-CM | POA: Diagnosis not present

## 2022-10-06 MED ORDER — METHOCARBAMOL 500 MG PO TABS
500.0000 mg | ORAL_TABLET | Freq: Three times a day (TID) | ORAL | 0 refills | Status: DC | PRN
Start: 2022-10-06 — End: 2022-10-14

## 2022-10-06 NOTE — Progress Notes (Signed)
Assessment & Plan:  1. Musculoskeletal back pain Back exercises provided for patient to complete at home.  May continue Tylenol in addition to Robaxin. - methocarbamol (ROBAXIN) 500 MG tablet; Take 1 tablet (500 mg total) by mouth every 8 (eight) hours as needed for muscle spasms.  Dispense: 30 tablet; Refill: 0   Follow up plan: Return if symptoms worsen or fail to improve.  [name], MSN, APRN, FNP-C  Subjective:  HPI: [name] is a 88 y.o. female presenting on 10/06/2022 for Back Pain (Trashcan fell over Thursday and she was bent over awhile picking up trash, then Friday back pain started slightly, now cont. To get worse)  Patient is accompanied by her daughter-in-law, who she is okay with being present.  Back Pain: Patient presents for presents evaluation of low back problems.  Symptoms have been present for 3 days and include pain in lower/mid back (aching in character; 7/10 in severity). Initial inciting event:  bending over picking up trash from where the trash can fell over .  Alleviating factors identifiable by patient are recumbency and Tylenol . Exacerbating factors identifiable by patient are bending forwards and standing. Treatments so far initiated by patient:  Tylenol arthritis  Previous lower back problems: none. Previous workup: none. Previous treatments: none.   ROS: Negative unless specifically indicated above in HPI.   Relevant past medical history reviewed and updated as indicated.   Allergies and medications reviewed and updated.   Current Outpatient Medications:    Accu-Chek Softclix Lancets lancets, USE AS INSTRUCTED, Disp: 100 each, Rfl: 3   acetaminophen (TYLENOL) 500 MG tablet, Take 500 mg by mouth every 6 (six) hours as needed., Disp: , Rfl:    Alcohol Swabs (DROPSAFE ALCOHOL PREP) 70 % PADS, USE TO CLEAN INJECTION SITE TO CHECK BLOOD SUGARS DAILY, Disp: 100 each, Rfl: 3   ALPRAZolam (XANAX) 0.5 MG tablet, TAKE 1/2 TO 1 TABLET BY MOUTH THREE  TIMES DAILY AS NEEDED FOR ANXIETY.  DO NOT TAKE WITH TRAMADOL. THIS IS A 30 DAY SUPPLY, Disp: 90 tablet, Rfl: 3   amLODipine (NORVASC) 5 MG tablet, TAKE 1 TABLET(5 MG) BY MOUTH DAILY, Disp: 90 tablet, Rfl: 3   aspirin 81 MG tablet, Take 81 mg by mouth daily., Disp: , Rfl:    Cholecalciferol (VITAMIN D3) 50 MCG (2000 UT) capsule, TAKE 1 CAPSULE BY MOUTH EVERY DAY, Disp: 100 capsule, Rfl: 3   diclofenac Sodium (VOLTAREN) 1 % GEL, Apply 2 g topically 4 (four) times daily., Disp: 150 g, Rfl: 1   furosemide (LASIX) 20 MG tablet, TAKE 1 TABLET(20 MG) BY MOUTH DAILY AS NEEDED FOR SWELLING, Disp: 90 tablet, Rfl: 3   glucose blood (ACCU-CHEK AVIVA PLUS) test strip, USE TO CHECK BLOOD SUGARS DAILY, Disp: 100 strip, Rfl: 3   hydroxypropyl methylcellulose (ISOPTO TEARS) 2.5 % ophthalmic solution, Place 1 drop into both eyes 2 (two) times daily., Disp: , Rfl:    isosorbide mononitrate (IMDUR) 30 MG 24 hr tablet, TAKE 1 TABLET BY MOUTH EVERY DAY, Disp: 90 tablet, Rfl: 3   metoprolol succinate (TOPROL-XL) 50 MG 24 hr tablet, TAKE 1 AND 1/2 TABLETS BY MOUTH TWICE DAILY, Disp: 270 tablet, Rfl: 3   pantoprazole (PROTONIX) 40 MG tablet, TAKE 1 TABLET BY MOUTH EVERY DAY, Disp: 90 tablet, Rfl: 1   simvastatin (ZOCOR) 20 MG tablet, Take 1 tablet (20 mg total) by mouth at bedtime., Disp: 90 tablet, Rfl: 3   warfarin (COUMADIN) 5 MG tablet, TAKE 1/2 TO 1 TABLET  BY MOUTH DAILY AS DIRECTED BY COUMADIN CLINIC, Disp: 90 tablet, Rfl: 1   hyoscyamine (LEVSIN) 0.125 MG tablet, TAKE 1 TABLET BY MOUTH EVERY 6 HOURS AS NEEDED FOR UPTO 10 DAYS FOR CRAMPING (Patient not taking: Reported on 10/06/2022), Disp: 360 tablet, Rfl: 0   traMADol (ULTRAM) 50 MG tablet, Take 1 tablet (50 mg total) by mouth every 12 (twelve) hours as needed. (Patient not taking: Reported on 10/06/2022), Disp: 30 tablet, Rfl: 2  Allergies  Allergen Reactions   Clarithromycin     REACTION: unspecified   Fosamax [Alendronate Sodium]     achy   Guaifenesin Er      itching   Metformin     REACTION: pt states INTOL to Metformin "felt drained"   Prandin [Repaglinide]     Prandin dropped CBGs to 66 once - she stopped it.   Tape     Adhesive tape---rash    Objective:   BP 132/66   Pulse 64   Temp (!) 97.5 F (36.4 C)   Resp 20   Ht 5\' 6"  (1.676 m)   Wt 185 lb (83.9 kg)   BMI 29.86 kg/m    Physical Exam Vitals reviewed.  Constitutional:      General: She is not in acute distress.    Appearance: Normal appearance. She is not ill-appearing, toxic-appearing or diaphoretic.  HENT:     Head: Normocephalic and atraumatic.  Eyes:     General: No scleral icterus.       Right eye: No discharge.        Left eye: No discharge.     Conjunctiva/sclera: Conjunctivae normal.  Cardiovascular:     Rate and Rhythm: Normal rate.  Pulmonary:     Effort: Pulmonary effort is normal. No respiratory distress.  Musculoskeletal:        General: Normal range of motion.     Cervical back: Normal range of motion.     Thoracic back: Tenderness (muscular) present. No bony tenderness.     Lumbar back: Tenderness (muscular) present. No bony tenderness.  Skin:    General: Skin is warm and dry.     Capillary Refill: Capillary refill takes less than 2 seconds.  Neurological:     General: No focal deficit present.     Mental Status: She is alert and oriented to person, place, and time. Mental status is at baseline.  Psychiatric:        Mood and Affect: Mood normal.        Behavior: Behavior normal.        Thought Content: Thought content normal.        Judgment: Judgment normal.

## 2022-10-09 ENCOUNTER — Telehealth: Payer: Self-pay

## 2022-10-09 NOTE — Telephone Encounter (Addendum)
Left voice message asking patient to give office a call back for recent lab results. Will try calling again.   ----- Message from Azalee Course, [place] sent at 10/01/2022  1:17 PM EDT ----- Normal liver function. Cholesterol improved, but still not at goal (goal LDL < 70), recommend increase zocor to 40mg  daily and repeat FLP in 3-6 month.

## 2022-10-10 ENCOUNTER — Telehealth: Payer: Self-pay | Admitting: Cardiovascular Disease

## 2022-10-10 NOTE — Telephone Encounter (Signed)
Patient's husband called and said they are both frustrated because it has been 3 or 4 weeks and no one has contacted them about their results

## 2022-10-10 NOTE — Telephone Encounter (Signed)
Please disregard phone note

## 2022-10-11 ENCOUNTER — Emergency Department (HOSPITAL_COMMUNITY): Payer: Medicare HMO

## 2022-10-11 ENCOUNTER — Emergency Department (HOSPITAL_COMMUNITY)
Admission: EM | Admit: 2022-10-11 | Discharge: 2022-10-11 | Disposition: A | Discharge status: Home / Self Care | Payer: Medicare HMO | Attending: Emergency Medicine | Admitting: Emergency Medicine

## 2022-10-11 ENCOUNTER — Other Ambulatory Visit: Payer: Self-pay

## 2022-10-11 DIAGNOSIS — Z7901 Long term (current) use of anticoagulants: Secondary | ICD-10-CM | POA: Insufficient documentation

## 2022-10-11 DIAGNOSIS — Z79899 Other long term (current) drug therapy: Secondary | ICD-10-CM | POA: Diagnosis not present

## 2022-10-11 DIAGNOSIS — Z7982 Long term (current) use of aspirin: Secondary | ICD-10-CM | POA: Insufficient documentation

## 2022-10-11 DIAGNOSIS — I10 Essential (primary) hypertension: Secondary | ICD-10-CM | POA: Diagnosis not present

## 2022-10-11 DIAGNOSIS — R002 Palpitations: Secondary | ICD-10-CM | POA: Insufficient documentation

## 2022-10-11 DIAGNOSIS — E119 Type 2 diabetes mellitus without complications: Secondary | ICD-10-CM | POA: Insufficient documentation

## 2022-10-11 DIAGNOSIS — R0602 Shortness of breath: Secondary | ICD-10-CM | POA: Diagnosis not present

## 2022-10-11 DIAGNOSIS — I2510 Atherosclerotic heart disease of native coronary artery without angina pectoris: Secondary | ICD-10-CM | POA: Insufficient documentation

## 2022-10-11 LAB — BASIC METABOLIC PANEL
Anion gap: 8 (ref 5–15)
BUN: 12 mg/dL (ref 8–23)
CO2: 26 mmol/L (ref 22–32)
Calcium: 9 mg/dL (ref 8.9–10.3)
Chloride: 97 mmol/L — ABNORMAL LOW (ref 98–111)
Creatinine, Ser: 1.02 mg/dL — ABNORMAL HIGH (ref 0.44–1.00)
GFR, Estimated: 54 mL/min — ABNORMAL LOW (ref >60)
Glucose, Bld: 139 mg/dL — ABNORMAL HIGH (ref 70–99)
Potassium: 3.8 mmol/L (ref 3.5–5.1)
Sodium: 131 mmol/L — ABNORMAL LOW (ref 135–145)

## 2022-10-11 LAB — CBC WITH DIFFERENTIAL/PLATELET
Abs Immature Granulocytes: 0.01 10*3/uL (ref 0.00–0.07)
Basophils Absolute: 0 10*3/uL (ref 0.0–0.1)
Basophils Relative: 0 %
Eosinophils Absolute: 0.1 10*3/uL (ref 0.0–0.5)
Eosinophils Relative: 1 %
HCT: 36.1 % (ref 36.0–46.0)
Hemoglobin: 11.7 g/dL — ABNORMAL LOW (ref 12.0–15.0)
Immature Granulocytes: 0 %
Lymphocytes Relative: 24 %
Lymphs Abs: 1.3 10*3/uL (ref 0.7–4.0)
MCH: 29 pg (ref 26.0–34.0)
MCHC: 32.4 g/dL (ref 30.0–36.0)
MCV: 89.4 fL (ref 80.0–100.0)
Monocytes Absolute: 0.5 10*3/uL (ref 0.1–1.0)
Monocytes Relative: 9 %
Neutro Abs: 3.6 10*3/uL (ref 1.7–7.7)
Neutrophils Relative %: 66 %
Platelets: 206 10*3/uL (ref 150–400)
RBC: 4.04 MIL/uL (ref 3.87–5.11)
RDW: 13.1 % (ref 11.5–15.5)
WBC: 5.5 10*3/uL (ref 4.0–10.5)
nRBC: 0 % (ref 0.0–0.2)

## 2022-10-11 LAB — PROTIME-INR
INR: 2.3 — ABNORMAL HIGH (ref 0.8–1.2)
Prothrombin Time: 25.4 seconds — ABNORMAL HIGH (ref 11.4–15.2)

## 2022-10-11 NOTE — ED Triage Notes (Signed)
Patient coming to ED for evaluation of intermittent heart palpations.  States has noticed today that she was having "the feeling in my chest that my heart is racing."  No reports of chest pain.  Hx of palpations per family.  Takes Coumadin

## 2022-10-11 NOTE — ED Provider Notes (Signed)
[place] [facility] DEPART[facility] AT [facility] Provider Note   CSN: [id number] Arrival date & time: 10/11/22  1904     History  Chief Complaint  Patient presents with   Palpitations    [name] is a 88 y.o. female.  Patient is a 88 year old female who presents with heart palpitations.  She has a history of paroxysmal atrial fibrillation on Coumadin, hypertension, lipidemia, coronary artery disease, diabetes.  She states that she started having some palpitations earlier today and felt like her heart was racing "for a while".  She could not tell me a better timeframe of how long it lasted.  She did not have any dizziness chest pain or other significant symptoms during the palpitations.  She said she got a little short of breath.  She no longer feels like her heart is racing but is still having some palpitations.  On chart review, she has previously been seen by cardiology for palpitations.  She had had some runs of SVT.  She is on metoprolol which they ended up increasing to 75 mg.  She also takes an extra 25 mg as needed if she has palpitations.  She did take that today.       Home Medications Prior to Admission medications   Medication Sig Start Date End Date Taking? Authorizing Provider  Accu-Chek Softclix Lancets lancets USE AS INSTRUCTED 10/22/21   Plotnikov, Georgina Quint, MD  acetaminophen (TYLENOL) 500 MG tablet Take 500 mg by mouth every 6 (six) hours as needed.    [provider]  Alcohol Swabs (DROPSAFE ALCOHOL PREP) 70 % PADS USE TO CLEAN INJECTION SITE TO CHECK BLOOD SUGARS DAILY 10/22/21   Plotnikov, Georgina Quint, MD  ALPRAZolam (XANAX) 0.5 MG tablet TAKE 1/2 TO 1 TABLET BY MOUTH THREE TIMES DAILY AS NEEDED FOR ANXIETY.  DO NOT TAKE WITH TRAMADOL. THIS IS A 30 DAY SUPPLY 09/16/22   Plotnikov, Georgina Quint, MD  amLODipine (NORVASC) 5 MG tablet TAKE 1 TABLET(5 MG) BY MOUTH DAILY 06/30/22   Plotnikov, Georgina Quint, MD  aspirin 81 MG tablet Take 81 mg by mouth daily.     [provider]  Cholecalciferol (VITAMIN D3) 50 MCG (2000 UT) capsule TAKE 1 CAPSULE BY MOUTH EVERY DAY 10/14/21   Plotnikov, Georgina Quint, MD  diclofenac Sodium (VOLTAREN) 1 % GEL Apply 2 g topically 4 (four) times daily. 11/01/19   Cristie Hem, PA-C  furosemide (LASIX) 20 MG tablet TAKE 1 TABLET(20 MG) BY MOUTH DAILY AS NEEDED FOR SWELLING 12/06/21   Hilty, [name], MD  glucose blood (ACCU-CHEK AVIVA PLUS) test strip USE TO CHECK BLOOD SUGARS DAILY 10/22/21   Plotnikov, Georgina Quint, MD  hydroxypropyl methylcellulose (ISOPTO TEARS) 2.5 % ophthalmic solution Place 1 drop into both eyes 2 (two) times daily.    [provider]  hyoscyamine (LEVSIN) 0.125 MG tablet TAKE 1 TABLET BY MOUTH EVERY 6 HOURS AS NEEDED FOR UPTO 10 DAYS FOR CRAMPING Patient not taking: Reported on 10/06/2022 04/24/22   Plotnikov, Georgina Quint, MD  isosorbide mononitrate (IMDUR) 30 MG 24 hr tablet TAKE 1 TABLET BY MOUTH EVERY DAY 12/19/21   Rollene Rotunda, MD  methocarbamol (ROBAXIN) 500 MG tablet Take 1 tablet (500 mg total) by mouth every 8 (eight) hours as needed for muscle spasms. 10/06/22   Gwenlyn Fudge, FNP  metoprolol succinate (TOPROL-XL) 50 MG 24 hr tablet TAKE 1 AND 1/2 TABLETS BY MOUTH TWICE DAILY 06/19/22   Hilty, [name], MD  pantoprazole (PROTONIX)  40 MG tablet TAKE 1 TABLET BY MOUTH EVERY DAY 06/05/22   Hilty, [name], MD  simvastatin (ZOCOR) 20 MG tablet Take 1 tablet (20 mg total) by mouth at bedtime. 07/01/22   Hilty, [name], MD  traMADol (ULTRAM) 50 MG tablet Take 1 tablet (50 mg total) by mouth every 12 (twelve) hours as needed. Patient not taking: Reported on 10/06/2022 12/25/21   Cristie Hem, PA-C  warfarin (COUMADIN) 5 MG tablet TAKE 1/2 TO 1 TABLET BY MOUTH DAILY AS DIRECTED BY COUMADIN CLINIC 09/09/22   Hilty, [name], MD      Allergies    Clarithromycin, Fosamax [alendronate sodium], Guaifenesin er, Metformin, Prandin [repaglinide], and Tape    Review of Systems   Review of  Systems  Constitutional:  Negative for chills, diaphoresis, fatigue and fever.  HENT:  Negative for congestion, rhinorrhea and sneezing.   Eyes: Negative.   Respiratory:  Positive for shortness of breath. Negative for cough and chest tightness.   Cardiovascular:  Positive for palpitations. Negative for chest pain and leg swelling.  Gastrointestinal:  Negative for abdominal pain, blood in stool, diarrhea, nausea and vomiting.  Genitourinary:  Negative for difficulty urinating, flank pain, frequency and hematuria.  Musculoskeletal:  Negative for arthralgias and back pain.  Skin:  Negative for rash.  Neurological:  Negative for dizziness, speech difficulty, weakness, numbness and headaches.    Physical Exam Updated Vital Signs BP (!) 149/87 (BP Location: Left Arm)   Pulse 66   Temp (!) 97.3 F (36.3 C) (Oral)   Resp 18   Ht 5\' 6"  (1.676 m)   Wt 83.9 kg   SpO2 99%   BMI 29.86 kg/m  Physical Exam Constitutional:      Appearance: She is well-developed.  HENT:     Head: Normocephalic and atraumatic.  Eyes:     Pupils: Pupils are equal, round, and reactive to light.  Cardiovascular:     Rate and Rhythm: Normal rate and regular rhythm.     Heart sounds: Normal heart sounds.  Pulmonary:     Effort: Pulmonary effort is normal. No respiratory distress.     Breath sounds: Normal breath sounds. No wheezing or rales.  Chest:     Chest wall: No tenderness.  Abdominal:     General: Bowel sounds are normal.     Palpations: Abdomen is soft.     Tenderness: There is no abdominal tenderness. There is no guarding or rebound.  Musculoskeletal:        General: Normal range of motion.     Cervical back: Normal range of motion and neck supple.  Lymphadenopathy:     Cervical: No cervical adenopathy.  Skin:    General: Skin is warm and dry.     Findings: No rash.  Neurological:     Mental Status: She is alert and oriented to person, place, and time.     ED Results / Procedures /  Treatments   Labs (all labs ordered are listed, but only abnormal results are displayed) Labs Reviewed  BASIC METABOLIC PANEL - Abnormal; Notable for the following components:      Result Value   Sodium 131 (*)    Chloride 97 (*)    Glucose, Bld 139 (*)    Creatinine, Ser 1.02 (*)    GFR, Estimated 54 (*)    All other components within normal limits  CBC WITH DIFFERENTIAL/PLATELET - Abnormal; Notable for the following components:   Hemoglobin 11.7 (*)  All other components within normal limits  PROTIME-INR - Abnormal; Notable for the following components:   Prothrombin Time 25.4 (*)    INR 2.3 (*)    All other components within normal limits    EKG EKG Interpretation Date/Time:  Saturday October 11 2022 19:23:07 EDT Ventricular Rate:  70 PR Interval:  208 QRS Duration:  139 QT Interval:  431 QTC Calculation: 466 R Axis:   -42  Text Interpretation: Sinus rhythm Atrial premature complexes Left bundle branch block Confirmed by Rolan Bucco [phone]) on 10/11/2022 8:08:32 PM  Radiology DG Chest Port 1 View  Result Date: 10/11/2022 CLINICAL DATA:  Shortness of breath EXAM: PORTABLE CHEST 1 VIEW COMPARISON:  06/23/2020 FINDINGS: Lungs are clear.  No pleural effusion or pneumothorax. The heart is normal in size. IMPRESSION: No acute cardiopulmonary disease. Electronically Signed   By: Charline Bills M.D.   On: 10/11/2022 21:37    Procedures Procedures    Medications Ordered in ED Medications - No data to display  ED Course/ Medical Decision Making/ A&P                             Medical Decision Making Amount and/or Complexity of Data Reviewed Labs: ordered. Radiology: ordered.   Patient is a 88 year old who presents with palpitations.  She does have a history of paroxysmal atrial fibrillation and is on Coumadin.  She also per cardiology notes has had a history of SVT.  Her EKG shows a sinus rhythm with some PACs.  She does have a left bundle branch block which was  noted at a cardiology visit last year.  She is otherwise asymptomatic.  Her labs are nonconcerning.  Her INR is within the therapeutic range.  She has not had any noted arrhythmias on monitoring here in the ED. chest x-ray does not show any suggestions of fluid overload.  No pneumonia.  This was interpreted by me and confirmed by the radiologist.  She was discharged home in good condition.  She was encouraged to follow-up with her audiologist.  Return precautions were given.  Final Clinical Impression(s) / ED Diagnoses Final diagnoses:  Palpitations    Rx / DC Orders ED Discharge Orders     None         Rolan Bucco, MD 10/11/22 2215

## 2022-10-13 ENCOUNTER — Telehealth: Payer: Self-pay | Admitting: Internal Medicine

## 2022-10-13 NOTE — Telephone Encounter (Signed)
Patient was seen 10/06/2022 by [name]. She was prescribed methocarbamol (ROBAXIN) 500 MG tablet for her muscle spasms, but the patient said it only helps a little. Patient would like a call back at [phone].

## 2022-10-14 ENCOUNTER — Ambulatory Visit (INDEPENDENT_AMBULATORY_CARE_PROVIDER_SITE_OTHER): Payer: Medicare HMO

## 2022-10-14 ENCOUNTER — Telehealth: Payer: Self-pay | Admitting: *Deleted

## 2022-10-14 ENCOUNTER — Ambulatory Visit: Payer: Self-pay

## 2022-10-14 ENCOUNTER — Encounter: Payer: Self-pay | Admitting: *Deleted

## 2022-10-14 ENCOUNTER — Ambulatory Visit (INDEPENDENT_AMBULATORY_CARE_PROVIDER_SITE_OTHER): Payer: Medicare HMO | Admitting: Internal Medicine

## 2022-10-14 ENCOUNTER — Encounter: Payer: Self-pay | Admitting: Internal Medicine

## 2022-10-14 VITALS — BP 118/78 | HR 60 | Temp 98.6°F | Ht 66.0 in | Wt 182.0 lb

## 2022-10-14 DIAGNOSIS — M545 Low back pain, unspecified: Secondary | ICD-10-CM | POA: Diagnosis not present

## 2022-10-14 DIAGNOSIS — M5136 Other intervertebral disc degeneration, lumbar region: Secondary | ICD-10-CM | POA: Diagnosis not present

## 2022-10-14 DIAGNOSIS — M4310 Spondylolisthesis, site unspecified: Secondary | ICD-10-CM | POA: Diagnosis not present

## 2022-10-14 DIAGNOSIS — I10 Essential (primary) hypertension: Secondary | ICD-10-CM

## 2022-10-14 DIAGNOSIS — I700 Atherosclerosis of aorta: Secondary | ICD-10-CM | POA: Diagnosis not present

## 2022-10-14 DIAGNOSIS — R002 Palpitations: Secondary | ICD-10-CM | POA: Diagnosis not present

## 2022-10-14 MED ORDER — METHYLPREDNISOLONE 4 MG PO TBPK: ORAL_TABLET | ORAL | 0 refills | Status: DC

## 2022-10-14 MED ORDER — ACETAMINOPHEN-CODEINE 300-30 MG PO TABS: 0.5000 | ORAL_TABLET | Freq: Four times a day (QID) | ORAL | 0 refills | Status: DC | PRN

## 2022-10-14 NOTE — Assessment & Plan Note (Signed)
Chronic Cont on Metoprolol, Norvasc 

## 2022-10-14 NOTE — Transitions of Care (Post Inpatient/ED Visit) (Signed)
   10/14/2022  Name: [name] MRN: [id number] DOB: 1934/10/06  Today's TOC FU Call Status: Today's TOC FU Call Status:: Unsuccessul Call (1st Attempt) Unsuccessful Call (1st Attempt) Date: 10/14/22  ED EMMI Red Alert notification on 10/14/22 from ED visit 10/11/22- automated EMMI call placed 10/13/22: "No scheduled follow up"  Attempted to reach the patient regarding the most recent ED visit; left HIPAA compliant voice message requesting call back  Follow Up Plan: Additional outreach attempts will be made to reach the patient to complete the Transitions of Care (Post ED visit) call.   Caryl Pina, RN, BSN, CCRN Alumnus RN CM Care Coordination/ Transition of Care- [facility] Care Management [phone]: direct office

## 2022-10-14 NOTE — Chronic Care Management (AMB) (Signed)
   10/14/2022  [name] 12/04/1934 [id number]   Reason for Encounter: Patient is not currently enrolled in the CCM program. CCM enrollment status changed to "Previously enrolled"   Katha Cabal [facility] Health/Chronic Care Management [phone]

## 2022-10-14 NOTE — Telephone Encounter (Signed)
Pt has appt this am @ 11:00.Marland KitchenRaechel Wiggins

## 2022-10-14 NOTE — Progress Notes (Signed)
Subjective:  Patient ID: [name], female    DOB: 09-May-1934  Age: 88 y.o. MRN: [id number]  CC: Back Pain (Left sided lower back pain radiating around to the front left side of hip area. Pt states methocarbamol is nt helping with pain)   HPI [name] presents for LBP x1-2 wks. No injury.R>L. No irradiation to the leg. Pain is sever at times. Worse w/ROM. Palpitations f/u  Outpatient Medications Prior to Visit  Medication Sig Dispense Refill   Accu-Chek Softclix Lancets lancets USE AS INSTRUCTED 100 each 3   acetaminophen (TYLENOL) 500 MG tablet Take 500 mg by mouth every 6 (six) hours as needed.     Alcohol Swabs (DROPSAFE ALCOHOL PREP) 70 % PADS USE TO CLEAN INJECTION SITE TO CHECK BLOOD SUGARS DAILY 100 each 3   ALPRAZolam (XANAX) 0.5 MG tablet TAKE 1/2 TO 1 TABLET BY MOUTH THREE TIMES DAILY AS NEEDED FOR ANXIETY.  DO NOT TAKE WITH TRAMADOL. THIS IS A 30 DAY SUPPLY 90 tablet 3   amLODipine (NORVASC) 5 MG tablet TAKE 1 TABLET(5 MG) BY MOUTH DAILY 90 tablet 3   aspirin 81 MG tablet Take 81 mg by mouth daily.     Cholecalciferol (VITAMIN D3) 50 MCG (2000 UT) capsule TAKE 1 CAPSULE BY MOUTH EVERY DAY 100 capsule 3   diclofenac Sodium (VOLTAREN) 1 % GEL Apply 2 g topically 4 (four) times daily. 150 g 1   ezetimibe (ZETIA) 10 MG tablet Take 10 mg by mouth daily.     furosemide (LASIX) 20 MG tablet TAKE 1 TABLET(20 MG) BY MOUTH DAILY AS NEEDED FOR SWELLING 90 tablet 3   glucose blood (ACCU-CHEK AVIVA PLUS) test strip USE TO CHECK BLOOD SUGARS DAILY 100 strip 3   hydroxypropyl methylcellulose (ISOPTO TEARS) 2.5 % ophthalmic solution Place 1 drop into both eyes 2 (two) times daily.     isosorbide mononitrate (IMDUR) 30 MG 24 hr tablet TAKE 1 TABLET BY MOUTH EVERY DAY 90 tablet 3   metoprolol succinate (TOPROL-XL) 50 MG 24 hr tablet TAKE 1 AND 1/2 TABLETS BY MOUTH TWICE DAILY 270 tablet 3   pantoprazole (PROTONIX) 40 MG tablet TAKE 1 TABLET BY MOUTH EVERY DAY 90 tablet 1    simvastatin (ZOCOR) 20 MG tablet Take 1 tablet (20 mg total) by mouth at bedtime. 90 tablet 3   warfarin (COUMADIN) 5 MG tablet TAKE 1/2 TO 1 TABLET BY MOUTH DAILY AS DIRECTED BY COUMADIN CLINIC 90 tablet 1   methocarbamol (ROBAXIN) 500 MG tablet Take 1 tablet (500 mg total) by mouth every 8 (eight) hours as needed for muscle spasms. 30 tablet 0   hyoscyamine (LEVSIN) 0.125 MG tablet TAKE 1 TABLET BY MOUTH EVERY 6 HOURS AS NEEDED FOR UPTO 10 DAYS FOR CRAMPING (Patient not taking: Reported on 10/06/2022) 360 tablet 0   traMADol (ULTRAM) 50 MG tablet Take 1 tablet (50 mg total) by mouth every 12 (twelve) hours as needed. (Patient not taking: Reported on 10/06/2022) 30 tablet 2   No facility-administered medications prior to visit.    ROS: Review of Systems  Constitutional:  Positive for fatigue. Negative for activity change, appetite change, chills and unexpected weight change.  HENT:  Negative for congestion, mouth sores and sinus pressure.   Eyes:  Negative for visual disturbance.  Respiratory:  Negative for cough and chest tightness.   Cardiovascular:  Negative for leg swelling.  Gastrointestinal:  Negative for abdominal pain and nausea.  Genitourinary:  Negative for difficulty urinating, frequency and  vaginal pain.  Musculoskeletal:  Positive for back pain and gait problem. Negative for neck pain.  Skin:  Negative for pallor and rash.  Neurological:  Negative for dizziness, tremors, weakness, numbness and headaches.  Psychiatric/Behavioral:  Negative for confusion and sleep disturbance.     Objective:  BP 118/78 (BP Location: Right Arm, Patient Position: Sitting, Cuff Size: Large)   Pulse 60   Temp 98.6 F (37 C) (Oral)   Ht 5\' 6"  (1.676 m)   Wt 182 lb (82.6 kg)   SpO2 98%   BMI 29.38 kg/m   BP Readings from Last 3 Encounters:  10/14/22 118/78  10/11/22 (!) 149/87  10/06/22 132/66    Wt Readings from Last 3 Encounters:  10/14/22 182 lb (82.6 kg)  10/11/22 185 lb (83.9 kg)   10/06/22 185 lb (83.9 kg)    Physical Exam Constitutional:      General: She is not in acute distress.    Appearance: She is well-developed. She is obese.  HENT:     Head: Normocephalic.     Right Ear: External ear normal.     Left Ear: External ear normal.     Nose: Nose normal.  Eyes:     General:        Right eye: No discharge.        Left eye: No discharge.     Conjunctiva/sclera: Conjunctivae normal.     Pupils: Pupils are equal, round, and reactive to light.  Neck:     Thyroid: No thyromegaly.     Vascular: No JVD.     Trachea: No tracheal deviation.  Cardiovascular:     Rate and Rhythm: Normal rate and regular rhythm.     Heart sounds: Normal heart sounds.  Pulmonary:     Effort: No respiratory distress.     Breath sounds: No stridor. No wheezing.  Abdominal:     General: Bowel sounds are normal. There is no distension.     Palpations: Abdomen is soft. There is no mass.     Tenderness: There is no abdominal tenderness. There is no guarding or rebound.  Musculoskeletal:        General: No tenderness.     Cervical back: Normal range of motion and neck supple. No rigidity.  Lymphadenopathy:     Cervical: No cervical adenopathy.  Skin:    Findings: No erythema or rash.  Neurological:     Mental Status: She is oriented to person, place, and time.     Cranial Nerves: No cranial nerve deficit.     Motor: No abnormal muscle tone.     Coordination: Coordination abnormal.     Gait: Gait abnormal.     Deep Tendon Reflexes: Reflexes normal.  Psychiatric:        Behavior: Behavior normal.        Thought Content: Thought content normal.        Judgment: Judgment normal.   Using a cane Antalgic gait LS hurts w/ROM Str leg (-) B  Lab Results  Component Value Date   WBC 5.5 10/11/2022   HGB 11.7 (L) 10/11/2022   HCT 36.1 10/11/2022   PLT 206 10/11/2022   GLUCOSE 139 (H) 10/11/2022   CHOL 161 09/26/2022   TRIG 101 09/26/2022   HDL 68 09/26/2022   LDLDIRECT  105.5 11/26/2010   LDLCALC 75 09/26/2022   ALT 11 09/26/2022   AST 16 09/26/2022   NA 131 (L) 10/11/2022   K 3.8 10/11/2022   CL  97 (L) 10/11/2022   CREATININE 1.02 (H) 10/11/2022   BUN 12 10/11/2022   CO2 26 10/11/2022   TSH 0.89 03/11/2022   INR 2.3 (H) 10/11/2022   HGBA1C 7.2 (H) 09/16/2022    DG Chest Port 1 View  Result Date: 10/11/2022 CLINICAL DATA:  Shortness of breath EXAM: PORTABLE CHEST 1 VIEW COMPARISON:  06/23/2020 FINDINGS: Lungs are clear.  No pleural effusion or pneumothorax. The heart is normal in size. IMPRESSION: No acute cardiopulmonary disease. Electronically Signed   By: Charline Bills M.D.   On: 10/11/2022 21:37    Assessment & Plan:   Problem List Items Addressed This Visit     Essential hypertension    Chronic Cont on Metoprolol, Norvasc      Relevant Medications   ezetimibe (ZETIA) 10 MG tablet   Palpitations - Primary    ER notes reviewed No relapse      Low back pain    New, likely MSK X ray LS spine Medrol pac T#3 prn  Potential benefits of a long term opioids use as well as potential risks (i.e. addiction risk, apnea etc) and complications (i.e. Somnolence, constipation and others) were explained to the patient and were aknowledged.       Relevant Medications   acetaminophen-codeine (TYLENOL/CODEINE #3) 300-30 MG tablet   methylPREDNISolone (MEDROL DOSEPAK) 4 MG TBPK tablet   Other Relevant Orders   DG Lumbar Spine 2-3 Views      Meds ordered this encounter  Medications   acetaminophen-codeine (TYLENOL/CODEINE #3) 300-30 MG tablet    Sig: Take 0.5-1 tablets by mouth every 6 (six) hours as needed for severe pain.    Dispense:  20 tablet    Refill:  0   methylPREDNISolone (MEDROL DOSEPAK) 4 MG TBPK tablet    Sig: As directed    Dispense:  21 tablet    Refill:  0      Follow-up: Return in about 3 months (around 01/14/2023) for a follow-up visit.  Sonda Primes, MD

## 2022-10-14 NOTE — Assessment & Plan Note (Signed)
New, likely MSK X ray LS spine Medrol pac T#3 prn  Potential benefits of a long term opioids use as well as potential risks (i.e. addiction risk, apnea etc) and complications (i.e. Somnolence, constipation and others) were explained to the patient and were aknowledged.

## 2022-10-14 NOTE — Telephone Encounter (Signed)
Noted.  She was also seen in the emergency room on 6/29 for palpitations.  It is probably best to schedule office visit to see me or [place]. Thank you

## 2022-10-14 NOTE — Assessment & Plan Note (Signed)
ER notes reviewed No relapse

## 2022-10-15 ENCOUNTER — Other Ambulatory Visit: Payer: Self-pay

## 2022-10-15 ENCOUNTER — Telehealth: Payer: Self-pay | Admitting: *Deleted

## 2022-10-15 DIAGNOSIS — E785 Hyperlipidemia, unspecified: Secondary | ICD-10-CM

## 2022-10-15 DIAGNOSIS — I2510 Atherosclerotic heart disease of native coronary artery without angina pectoris: Secondary | ICD-10-CM

## 2022-10-15 NOTE — Transitions of Care (Post Inpatient/ED Visit) (Signed)
10/15/2022  Name: [name] MRN: [id number] DOB: Jan 06, 1935  Today's TOC FU Call Status: Today's TOC FU Call Status:: Successful TOC FU Call Competed TOC FU Call Complete Date: 10/15/22  ED EMMI Red Alert notification on 10/14/22 from ED visit 10/11/22- automated EMMI call placed 10/13/22: "No scheduled follow up"   Transition Care Management Follow-up Telephone Call Date of Discharge: 10/11/22 Discharge Facility: [facility]) Type of Discharge: Emergency Department Reason for ED Visit: Cardiac Conditions Cardiac Conditions Diagnosis:  (palpitations) How have you been since you were released from the hospital?: Better ("I am doing okay.  Still having those palpitations, but not as much.  Thank you for reminding me of the heart doctor appointment next week.  I saw my PCP yesterday and he started me on pain medicine and prednisone for my back pain") Any questions or concerns?: No  Items Reviewed: Did you receive and understand the discharge instructions provided?: Yes (thoroughly reviewed with patient who verbalizes good understanding of same) Medications obtained,verified, and reconciled?: Partial Review Completed (Partial medication review completed; confirmed patient obtained/ is taking all newly Rx'd medications post- recent PCP OV 10/14/22 as instructed; family-manages medications and she denies questions/ concerns around medications today) Reason for Partial Mediation Review: patient declined full review- states daughter-in-law prepares medications in weekly pill box and is not here to review with me; patient is lying down and does not want to review list of medications; confirmed no new medications prescribed post-ED visit on 10/11/22 Any new allergies since your discharge?: No Dietary orders reviewed?: Yes Type of Diet Ordered:: Heart Healthy/ low salt Do you have support at home?: Yes People in Home: child(ren), adult Name of Support/Comfort Primary Source: Reports resides  with adult son; is essentially independent in self-care activities; supportive local family assists as/ if needed/ indicated  Medications Reviewed Today: Medications Reviewed Today     Reviewed by Michaela Corner, RN (Registered Nurse) on 10/15/22 at 1507  Med List Status: <None>   Medication Order Taking? Sig Documenting Provider Last Dose Status Informant  Accu-Chek Softclix Lancets lancets [id number]  USE AS INSTRUCTED Plotnikov, Georgina Quint, MD  Active   acetaminophen (TYLENOL) 500 MG tablet [id number]  Take 500 mg by mouth every 6 (six) hours as needed. [provider]  Active   acetaminophen-codeine (TYLENOL/CODEINE #3) 300-30 MG tablet [id number] Yes Take 0.5-1 tablets by mouth every 6 (six) hours as needed for severe pain. Plotnikov, Georgina Quint, MD Taking Active   Alcohol Swabs (DROPSAFE ALCOHOL PREP) 70 % PADS [id number]  USE TO CLEAN INJECTION SITE TO CHECK BLOOD SUGARS DAILY Plotnikov, Georgina Quint, MD  Active   ALPRAZolam (XANAX) 0.5 MG tablet [id number]  TAKE 1/2 TO 1 TABLET BY MOUTH THREE TIMES DAILY AS NEEDED FOR ANXIETY.  DO NOT TAKE WITH TRAMADOL. THIS IS A 30 DAY SUPPLY Plotnikov, Georgina Quint, MD  Active   amLODipine (NORVASC) 5 MG tablet [id number]  TAKE 1 TABLET(5 MG) BY MOUTH DAILY Plotnikov, Georgina Quint, MD  Active   aspirin 81 MG tablet [id number]  Take 81 mg by mouth daily. [provider]  Active Multiple Informants  Cholecalciferol (VITAMIN D3) 50 MCG (2000 UT) capsule [id number]  TAKE 1 CAPSULE BY MOUTH EVERY DAY Plotnikov, Georgina Quint, MD  Active   diclofenac Sodium (VOLTAREN) 1 % GEL [id number]  Apply 2 g topically 4 (four) times daily. Cristie Hem, PA-C  Active   ezetimibe (ZETIA) 10 MG tablet [id number]  Take 10 mg by mouth daily. [provider]  Active   furosemide (LASIX) 20 MG tablet [id number]  TAKE 1 TABLET(20 MG) BY MOUTH DAILY AS NEEDED FOR SWELLING Hilty, [name], MD  Active   glucose blood (ACCU-CHEK AVIVA PLUS) test strip [id number]  USE TO CHECK  BLOOD SUGARS DAILY Plotnikov, Georgina Quint, MD  Active   hydroxypropyl methylcellulose (ISOPTO TEARS) 2.5 % ophthalmic solution [id number]  Place 1 drop into both eyes 2 (two) times daily. [provider]  Active Multiple Informants  hyoscyamine (LEVSIN) 0.125 MG tablet [id number]  TAKE 1 TABLET BY MOUTH EVERY 6 HOURS AS NEEDED FOR UPTO 10 DAYS FOR CRAMPING  Patient not taking: Reported on 10/06/2022   Plotnikov, Georgina Quint, MD  Active   isosorbide mononitrate (IMDUR) 30 MG 24 hr tablet [id number]  TAKE 1 TABLET BY MOUTH EVERY DAY Hochrein, Fayrene Fearing, MD  Active   methylPREDNISolone (MEDROL DOSEPAK) 4 MG TBPK tablet [id number] Yes As directed Plotnikov, Georgina Quint, MD Taking Active   metoprolol succinate (TOPROL-XL) 50 MG 24 hr tablet [id number]  TAKE 1 AND 1/2 TABLETS BY MOUTH TWICE DAILY Chrystie Nose, MD  Active   pantoprazole (PROTONIX) 40 MG tablet [id number]  TAKE 1 TABLET BY MOUTH EVERY DAY Hilty, [name], MD  Active   simvastatin (ZOCOR) 20 MG tablet [id number]  Take 1 tablet (20 mg total) by mouth at bedtime. Chrystie Nose, MD  Active   warfarin (COUMADIN) 5 MG tablet [id number]  TAKE 1/2 TO 1 TABLET BY MOUTH DAILY AS DIRECTED BY COUMADIN CLINIC Hilty, [name], MD  Active            Home Care and Equipment/Supplies: Were Home Health Services Ordered?: No Any new equipment or medical supplies ordered?: No  Functional Questionnaire: Do you need assistance with bathing/showering or dressing?: No (reports family "supervises") Do you need assistance with meal preparation?: Yes (gets MOW; family assists with meal preparation) Do you need assistance with eating?: No Do you have difficulty maintaining continence: No Do you need assistance with getting out of bed/getting out of a chair/moving?: No Do you have difficulty managing or taking your medications?: Yes (daughter-in-law prepares medications in weekly pill box; patient then takes independently)  Follow up appointments  reviewed: PCP Follow-up appointment confirmed?: Yes Date of PCP follow-up appointment?: 10/14/22 Follow-up Provider: PCP; Dr. Avel Sensor-- confirmed attended as scheduled Specialist Hospital Follow-up appointment confirmed?: Yes Date of Specialist follow-up appointment?: 10/22/22 Follow-Up Specialty Provider:: cardiology provider Do you need transportation to your follow-up appointment?: No Do you understand care options if your condition(s) worsen?: Yes-patient verbalized understanding  SDOH Interventions Today    Flowsheet Row Most Recent Value  SDOH Interventions   Food Insecurity Interventions Intervention Not Indicated  [patient reports currently receives MOW]  Transportation Interventions Intervention Not Indicated  [family provides transportation: "usually my daughter-in-law or my niece"]      TOC Interventions Today    Flowsheet Row Most Recent Value  TOC Interventions   TOC Interventions Discussed/Reviewed TOC Interventions Discussed      Interventions Today    Flowsheet Row Most Recent Value  Chronic Disease   Chronic disease during today's visit Atrial Fibrillation (AFib), Other  [palpitaions,  chronic back pain]  General Interventions   General Interventions Discussed/Reviewed General Interventions Discussed, Doctor Visits, Referral to Nurse, Communication with, Durable Medical Equipment (DME)  Doctor Visits Discussed/Reviewed Doctor Visits Reviewed, Doctor Visits Discussed, Specialist, PCP  Durable Medical Equipment (DME) Other  [confirmed patient currently uses cane as needed]  PCP/Specialist Visits Compliance with follow-up visit  Communication with RN  [scheduled with RN CM 10/31/22]  Education Interventions   Education Provided Provided Education  Provided Verbal Education On Medication  [safe use of narcotic pain medication- prescribed during PCP office visit on 10/14/22]  Nutrition Interventions   Nutrition Discussed/Reviewed Nutrition Discussed  Pharmacy  Interventions   Pharmacy Dicussed/Reviewed Pharmacy Topics Discussed  Safety Interventions   Safety Discussed/Reviewed Safety Discussed      Caryl Pina, RN, BSN, CCRN Alumnus RN CM Care Coordination/ Transition of Care- [facility] [phone]: direct office

## 2022-10-19 ENCOUNTER — Emergency Department (HOSPITAL_COMMUNITY)
Admission: EM | Admit: 2022-10-19 | Discharge: 2022-10-19 | Disposition: A | Discharge status: Home / Self Care | Payer: Medicare HMO | Attending: Emergency Medicine | Admitting: Emergency Medicine

## 2022-10-19 ENCOUNTER — Other Ambulatory Visit: Payer: Self-pay

## 2022-10-19 DIAGNOSIS — R002 Palpitations: Secondary | ICD-10-CM

## 2022-10-19 DIAGNOSIS — I10 Essential (primary) hypertension: Secondary | ICD-10-CM | POA: Insufficient documentation

## 2022-10-19 DIAGNOSIS — Z7982 Long term (current) use of aspirin: Secondary | ICD-10-CM | POA: Insufficient documentation

## 2022-10-19 DIAGNOSIS — Z79899 Other long term (current) drug therapy: Secondary | ICD-10-CM | POA: Insufficient documentation

## 2022-10-19 DIAGNOSIS — I2510 Atherosclerotic heart disease of native coronary artery without angina pectoris: Secondary | ICD-10-CM | POA: Diagnosis not present

## 2022-10-19 DIAGNOSIS — E871 Hypo-osmolality and hyponatremia: Secondary | ICD-10-CM | POA: Diagnosis not present

## 2022-10-19 LAB — CBC
HCT: 39.2 % (ref 36.0–46.0)
Hemoglobin: 12.7 g/dL (ref 12.0–15.0)
MCH: 28.9 pg (ref 26.0–34.0)
MCHC: 32.4 g/dL (ref 30.0–36.0)
MCV: 89.3 fL (ref 80.0–100.0)
Platelets: 260 10*3/uL (ref 150–400)
RBC: 4.39 MIL/uL (ref 3.87–5.11)
RDW: 13 % (ref 11.5–15.5)
WBC: 9.5 10*3/uL (ref 4.0–10.5)
nRBC: 0 % (ref 0.0–0.2)

## 2022-10-19 LAB — BASIC METABOLIC PANEL
Anion gap: 7 (ref 5–15)
BUN: 22 mg/dL (ref 8–23)
CO2: 28 mmol/L (ref 22–32)
Calcium: 9.1 mg/dL (ref 8.9–10.3)
Chloride: 93 mmol/L — ABNORMAL LOW (ref 98–111)
Creatinine, Ser: 1.02 mg/dL — ABNORMAL HIGH (ref 0.44–1.00)
GFR, Estimated: 54 mL/min — ABNORMAL LOW (ref >60)
Glucose, Bld: 170 mg/dL — ABNORMAL HIGH (ref 70–99)
Potassium: 4.1 mmol/L (ref 3.5–5.1)
Sodium: 128 mmol/L — ABNORMAL LOW (ref 135–145)

## 2022-10-19 LAB — TROPONIN I (HIGH SENSITIVITY): Troponin I (High Sensitivity): 9 ng/L (ref <18)

## 2022-10-19 MED ORDER — SODIUM CHLORIDE 0.9 % IV BOLUS
1000.0000 mL | Freq: Once | INTRAVENOUS | Status: AC
  Administered 2022-10-19: 1000 mL via INTRAVENOUS

## 2022-10-19 NOTE — ED Provider Notes (Signed)
[place] EMERGENCY DEPARTMENT AT [facility] Provider Note   CSN: [id number] Arrival date & time: 10/19/22  1733     History {Add pertinent medical, surgical, social history, OB history to HPI:1} Chief Complaint  Patient presents with   Palpitations    [name] is a 88 y.o. female with a history of .   Palpitations      Home Medications Prior to Admission medications   Medication Sig Start Date End Date Taking? Authorizing Provider  Accu-Chek Softclix Lancets lancets USE AS INSTRUCTED 10/22/21   Plotnikov, Georgina Quint, MD  acetaminophen (TYLENOL) 500 MG tablet Take 500 mg by mouth every 6 (six) hours as needed.    [provider]  acetaminophen-codeine (TYLENOL/CODEINE #3) 300-30 MG tablet Take 0.5-1 tablets by mouth every 6 (six) hours as needed for severe pain. 10/14/22   Plotnikov, Georgina Quint, MD  Alcohol Swabs (DROPSAFE ALCOHOL PREP) 70 % PADS USE TO CLEAN INJECTION SITE TO CHECK BLOOD SUGARS DAILY 10/22/21   Plotnikov, Georgina Quint, MD  ALPRAZolam (XANAX) 0.5 MG tablet TAKE 1/2 TO 1 TABLET BY MOUTH THREE TIMES DAILY AS NEEDED FOR ANXIETY.  DO NOT TAKE WITH TRAMADOL. THIS IS A 30 DAY SUPPLY 09/16/22   Plotnikov, Georgina Quint, MD  amLODipine (NORVASC) 5 MG tablet TAKE 1 TABLET(5 MG) BY MOUTH DAILY 06/30/22   Plotnikov, Georgina Quint, MD  aspirin 81 MG tablet Take 81 mg by mouth daily.    [provider]  Cholecalciferol (VITAMIN D3) 50 MCG (2000 UT) capsule TAKE 1 CAPSULE BY MOUTH EVERY DAY 10/14/21   Plotnikov, Georgina Quint, MD  diclofenac Sodium (VOLTAREN) 1 % GEL Apply 2 g topically 4 (four) times daily. 11/01/19   Cristie Hem, PA-C  ezetimibe (ZETIA) 10 MG tablet Take 10 mg by mouth daily. 09/29/22   [provider]  furosemide (LASIX) 20 MG tablet TAKE 1 TABLET(20 MG) BY MOUTH DAILY AS NEEDED FOR SWELLING 12/06/21   Hilty, [name], MD  glucose blood (ACCU-CHEK AVIVA PLUS) test strip USE TO CHECK BLOOD SUGARS DAILY 10/22/21   Plotnikov, Georgina Quint,  MD  hydroxypropyl methylcellulose (ISOPTO TEARS) 2.5 % ophthalmic solution Place 1 drop into both eyes 2 (two) times daily.    [provider]  hyoscyamine (LEVSIN) 0.125 MG tablet TAKE 1 TABLET BY MOUTH EVERY 6 HOURS AS NEEDED FOR UPTO 10 DAYS FOR CRAMPING Patient not taking: Reported on 10/06/2022 04/24/22   Plotnikov, Georgina Quint, MD  isosorbide mononitrate (IMDUR) 30 MG 24 hr tablet TAKE 1 TABLET BY MOUTH EVERY DAY 12/19/21   Rollene Rotunda, MD  methylPREDNISolone (MEDROL DOSEPAK) 4 MG TBPK tablet As directed 10/14/22   Plotnikov, Georgina Quint, MD  metoprolol succinate (TOPROL-XL) 50 MG 24 hr tablet TAKE 1 AND 1/2 TABLETS BY MOUTH TWICE DAILY 06/19/22   Hilty, [name], MD  pantoprazole (PROTONIX) 40 MG tablet TAKE 1 TABLET BY MOUTH EVERY DAY 06/05/22   Hilty, [name], MD  simvastatin (ZOCOR) 20 MG tablet Take 1 tablet (20 mg total) by mouth at bedtime. 07/01/22   Chrystie Nose, MD  warfarin (COUMADIN) 5 MG tablet TAKE 1/2 TO 1 TABLET BY MOUTH DAILY AS DIRECTED BY COUMADIN CLINIC 09/09/22   Hilty, [name], MD      Allergies    Clarithromycin, Fosamax [alendronate sodium], Guaifenesin er, Metformin, Prandin [repaglinide], and Tape    Review of Systems   Review of Systems  Cardiovascular:  Positive for palpitations.    Physical Exam Updated Vital  Signs BP (!) 168/83   Pulse 61   Temp 98.1 F (36.7 C) (Oral)   Resp 15   SpO2 99%  Physical Exam  ED Results / Procedures / Treatments   Labs (all labs ordered are listed, but only abnormal results are displayed) Labs Reviewed  BASIC METABOLIC PANEL - Abnormal; Notable for the following components:      Result Value   Sodium 128 (*)    Chloride 93 (*)    Glucose, Bld 170 (*)    Creatinine, Ser 1.02 (*)    GFR, Estimated 54 (*)    All other components within normal limits  CBC  TROPONIN I (HIGH SENSITIVITY)    EKG EKG Interpretation Date/Time:  Sunday October 19 2022 17:44:27 EDT Ventricular Rate:  65 PR Interval:  202 QRS  Duration:  133 QT Interval:  443 QTC Calculation: 461 R Axis:   -26  Text Interpretation: Sinus rhythm Left bundle branch block Confirmed by Kristine Royal [phone]) on 10/19/2022 6:52:43 PM  Radiology No results found.  Procedures Procedures  {Document cardiac monitor, telemetry assessment procedure when appropriate:1}  Medications Ordered in ED Medications - No data to display  ED Course/ Medical Decision Making/ A&P   {   Click here for ABCD2, HEART and other calculatorsREFRESH Note before signing :1}                          Medical Decision Making  ***  {Document critical care time when appropriate:1} {Document review of labs and clinical decision tools ie heart score, Chads2Vasc2 etc:1}  {Document your independent review of radiology images, and any outside records:1} {Document your discussion with family members, caretakers, and with consultants:1} {Document social determinants of health affecting pt's care:1} {Document your decision making why or why not admission, treatments were needed:1} Final Clinical Impression(s) / ED Diagnoses Final diagnoses:  None    Rx / DC Orders ED Discharge Orders     None

## 2022-10-19 NOTE — ED Triage Notes (Signed)
Pt c/o x1 day of palpitations intermittent throughout the day with majority of episodes occurring within one hour. Denies CP, SOB, dizziness.

## 2022-10-19 NOTE — Discharge Instructions (Addendum)
As discussed, you have low sodium today in the ED. We have given you fluids to increase the level. Please follow up with your PCP this week for repeat labs to recheck your sodium level.  Get help right away if: You have chest pain. You feel short of breath. You have a very bad headache. You feel dizzy. You faint. You have a seizure. You keep having watery poop. You keep vomiting.

## 2022-10-21 IMAGING — CT CT HEAD W/O CM
3 series · 14 of 46 positions shown, 16 images · non-contrast
Comparison: None.

CLINICAL DATA: Headache.

EXAM:
CT HEAD WITHOUT CONTRAST
TECHNIQUE: Contiguous axial images were obtained from the base of the skull
through the vertex without intravenous contrast.

[Series 2: head wo · axial · 0.47mm/px · z∈[-151,-31]mm · 8 of 29 slices shown, 10 images]
[im 3/29  brain]
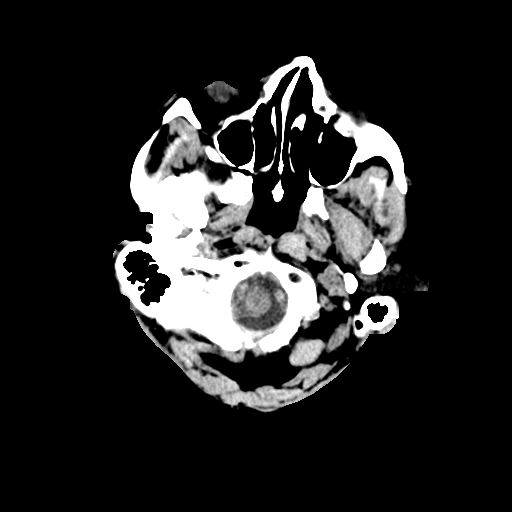
[im 3/29  bone]
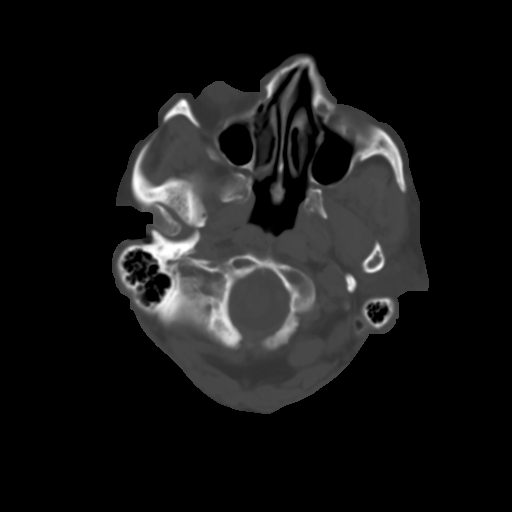
[im 7/29  brain]
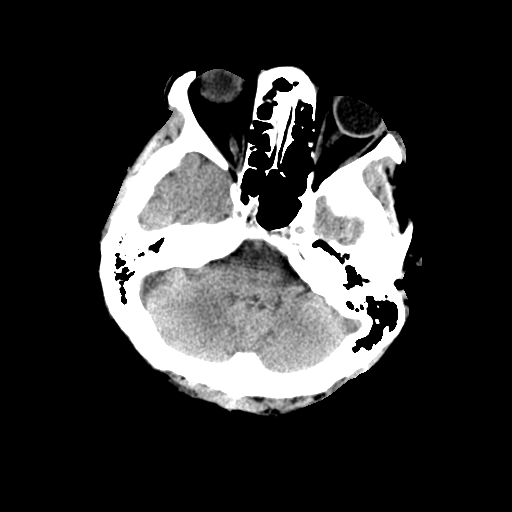
[im 10/29  brain]
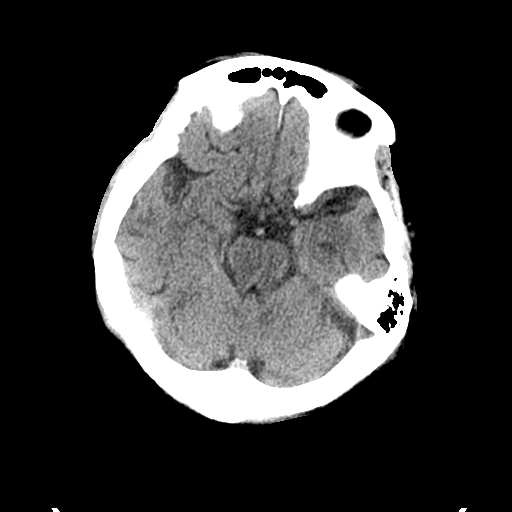
[im 13/29  brain]
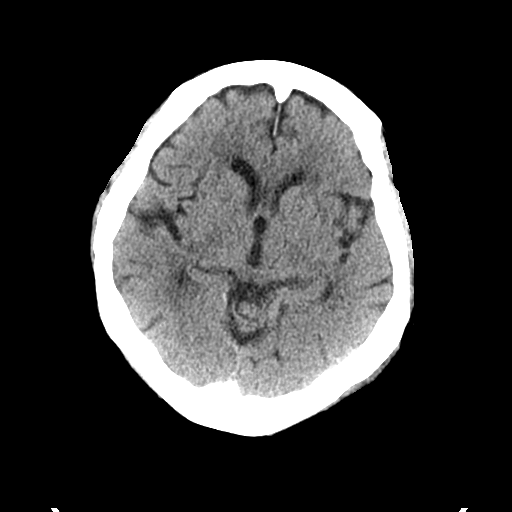
[im 17/29  brain]
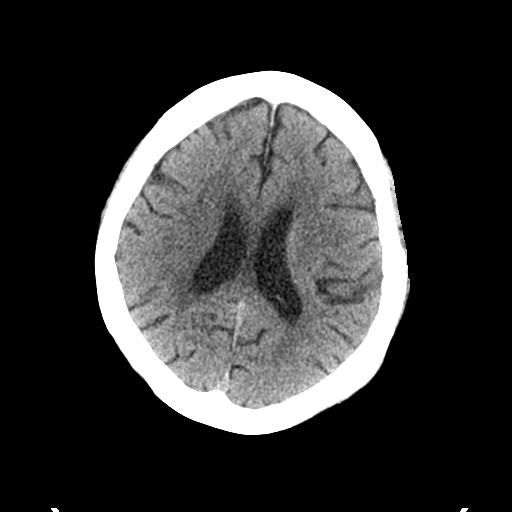
[im 17/29  bone]
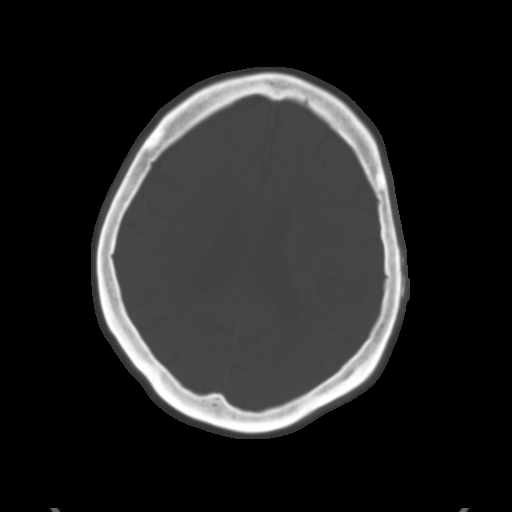
[im 20/29  brain]
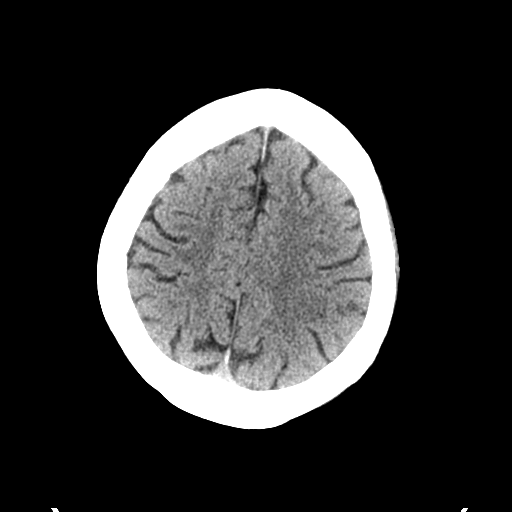
[im 23/29  brain]
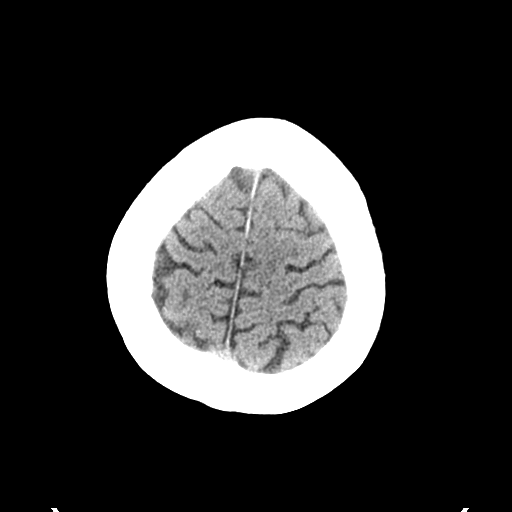
[im 27/29  brain]
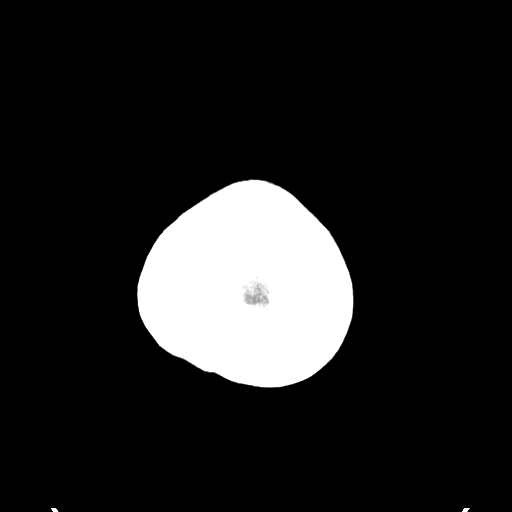

[Series 5: coronal soft tissue · coronal · 0.29mm/px · 3 of 78 slices shown]
[im 26/78  brain]
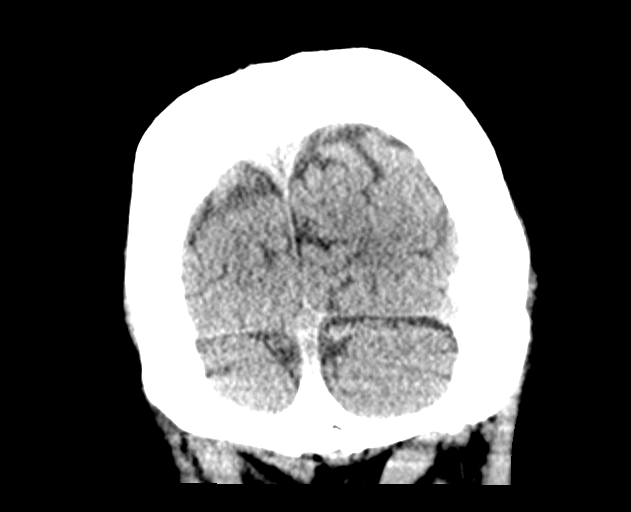
[im 35/78  brain]
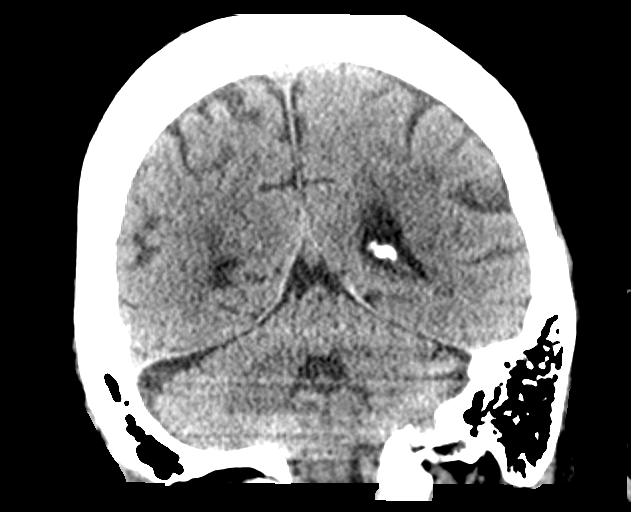
[im 43/78  brain]
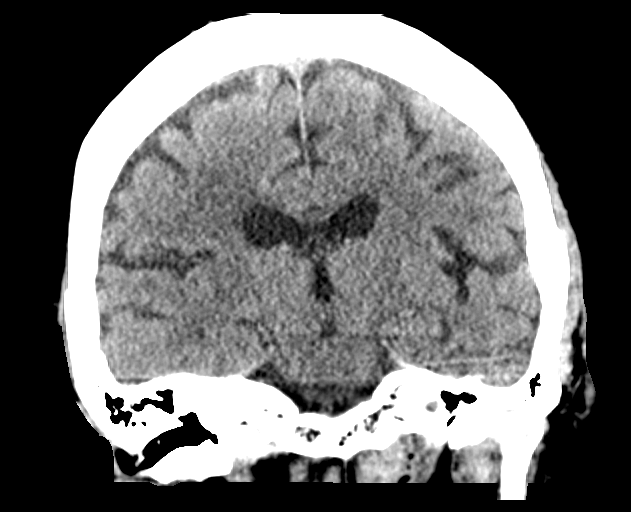

[Series 6: sagittal soft tissue · sagittal · 0.30mm/px · 3 of 65 slices shown]
[im 22/65  brain]
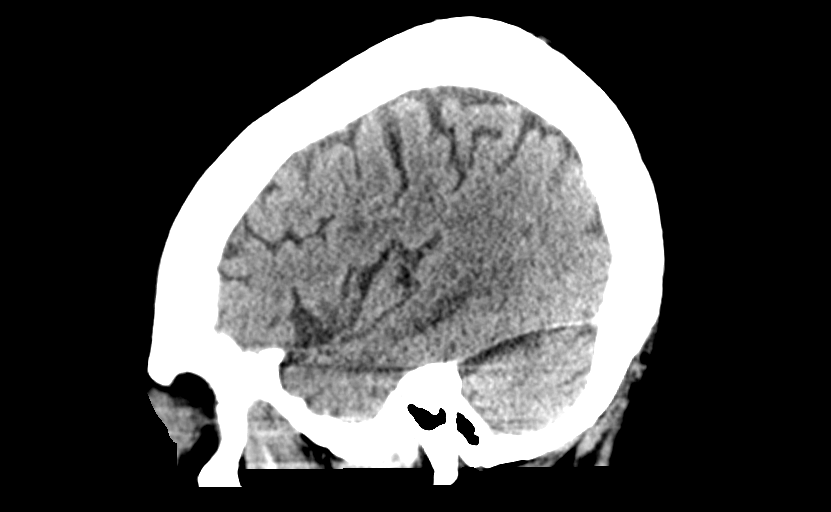
[im 33/65  brain]
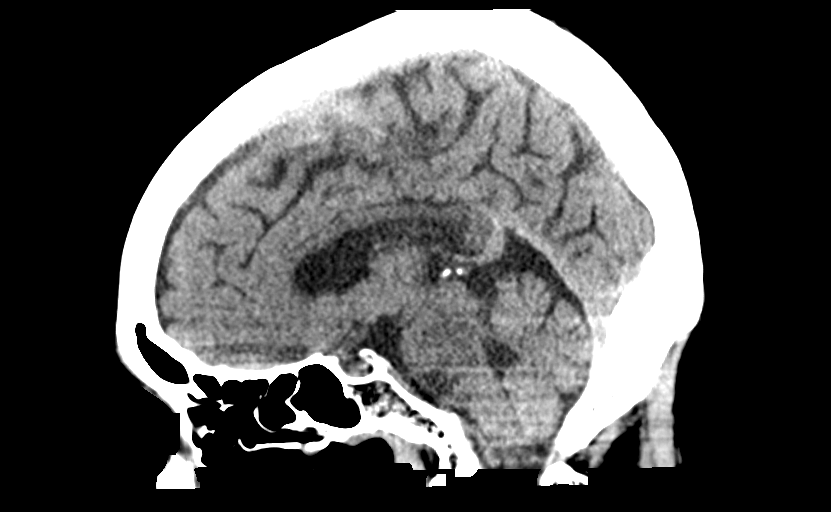
[im 43/65  brain]
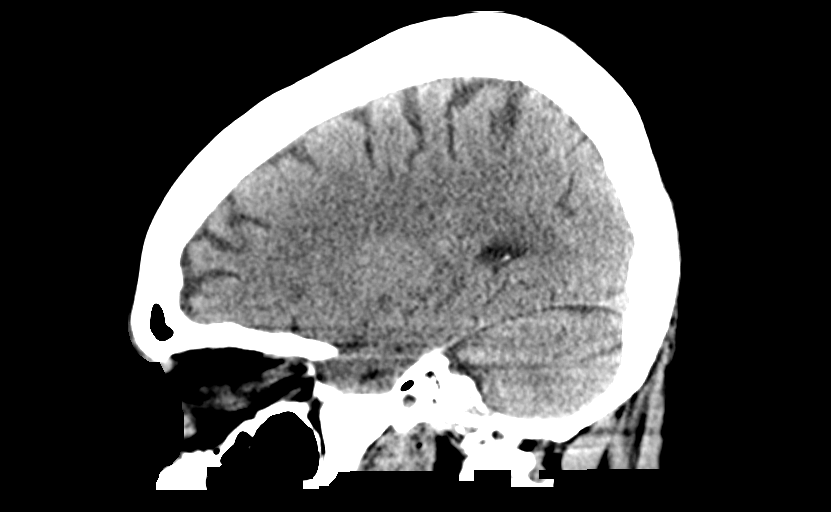

[14 of 46 positions shown; findings below may reference images not displayed]

FINDINGS: Brain: Mild chronic ischemic white matter disease is noted. No mass
effect or midline shift is noted. Ventricular size is within normal
limits. There is no evidence of mass lesion, hemorrhage or acute
infarction.

Vascular: No hyperdense vessel or unexpected calcification.

Skull: Normal. Negative for fracture or focal lesion.

Sinuses/Orbits: No acute finding.

Other: None.
IMPRESSION: Mild chronic ischemic white matter disease. No acute intracranial
abnormality seen.

## 2022-10-22 ENCOUNTER — Encounter: Payer: Self-pay | Admitting: Nurse Practitioner

## 2022-10-22 ENCOUNTER — Ambulatory Visit: Payer: Medicare HMO | Attending: Nurse Practitioner | Admitting: Nurse Practitioner

## 2022-10-22 VITALS — BP 110/64 | HR 51 | Ht 66.0 in | Wt 179.8 lb

## 2022-10-22 DIAGNOSIS — E785 Hyperlipidemia, unspecified: Secondary | ICD-10-CM | POA: Diagnosis not present

## 2022-10-22 DIAGNOSIS — I10 Essential (primary) hypertension: Secondary | ICD-10-CM | POA: Diagnosis not present

## 2022-10-22 DIAGNOSIS — I471 Supraventricular tachycardia, unspecified: Secondary | ICD-10-CM

## 2022-10-22 DIAGNOSIS — I2510 Atherosclerotic heart disease of native coronary artery without angina pectoris: Secondary | ICD-10-CM | POA: Diagnosis not present

## 2022-10-22 DIAGNOSIS — R002 Palpitations: Secondary | ICD-10-CM

## 2022-10-22 DIAGNOSIS — I480 Paroxysmal atrial fibrillation: Secondary | ICD-10-CM | POA: Diagnosis not present

## 2022-10-22 DIAGNOSIS — E119 Type 2 diabetes mellitus without complications: Secondary | ICD-10-CM

## 2022-10-22 DIAGNOSIS — I447 Left bundle-branch block, unspecified: Secondary | ICD-10-CM

## 2022-10-22 MED ORDER — SIMVASTATIN 20 MG PO TABS
20.0000 mg | ORAL_TABLET | Freq: Every day | ORAL | 3 refills | Status: DC
Start: 2022-10-22 — End: 2023-10-26

## 2022-10-22 NOTE — Progress Notes (Signed)
Office Visit    Patient Name: [name] Date of Encounter: 10/22/2022  Primary Care Provider:  Tresa Garter, MD Primary Cardiologist:  Chrystie Nose, MD  Chief Complaint    88 year old female with a history of nonobstructive CAD, LBBB, paroxysmal atrial fibrillation, PSVT, hypertension, hyperlipidemia, type 2 diabetes, and chronic venous insufficiency who presents follow-up related to palpitations.  Past Medical History    Past Medical History:  Diagnosis Date   Anxiety state, unspecified    Bronchitis, not specified as acute or chronic    Diaphragmatic hernia without mention of obstruction or gangrene    Dyslipidemia    Essential hypertension    Irritable bowel syndrome    Mitral valve disorders(424.0)    a. 08/2010 Echo: EF >55%, mild MR, mod TR, trace AI.   Non-obstructive CAD    a. 07/2010 Lexiscan MV: no ischemia, EF 78%; b. 07/2010 Cath: LM nl, LAD50/60p, 40 apical, LCX nl, RCA dominant, 30/40p, 15m.   Osteoarthrosis, unspecified whether generalized or localized, unspecified site    Osteoporosis, unspecified    Other abnormal glucose    PAF (paroxysmal atrial fibrillation) (HCC)    a. CHA2DS2VASc = 4-->coumadin.   Phlebitis and thrombophlebitis of superficial vessels of lower extremities    PSVT (paroxysmal supraventricular tachycardia)    a. 05/2012 Holter: short bursts of PSVT noted-->Managed with toprol.   Unspecified venous (peripheral) insufficiency    Unspecified vitamin D deficiency    Past Surgical History:  Procedure Laterality Date   CARDIAC CATHETERIZATION  07/30/2010   nonobstructive CAD, 20-40% RCA stenosis, 50-60% eccentric LAD stenosis more prox, 40% LAD stenosis more distal, EF 65%   TRANSTHORACIC ECHOCARDIOGRAM  09/03/2010   EF=>55%; mild MR; mod TR; AV mildly sclerotic with trace regurg; mild pulm valve regurg   VESICOVAGINAL FISTULA CLOSURE W/ TAH      Allergies  Allergies  Allergen Reactions   Clarithromycin     REACTION:  unspecified   Fosamax [Alendronate Sodium]     achy   Guaifenesin Er     itching   Metformin     REACTION: pt states INTOL to Metformin "felt drained"   Prandin [Repaglinide]     Prandin dropped CBGs to 66 once - she stopped it.   Tape     Adhesive tape---rash     Labs/Other Studies Reviewed    The following studies were reviewed today:  Cardiac Studies & Procedures     STRESS TESTS  NM MYOCAR MULTI W/SPECT W 07/22/2010  Narrative *RADIOLOGY REPORT*  Clinical Data:  Chest pain  MYOCARDIAL IMAGING WITH SPECT (REST AND PHARMACOLOGIC-STRESS) GATED LEFT VENTRICULAR WALL MOTION STUDY LEFT VENTRICULAR EJECTION FRACTION  Technique:  Standard myocardial SPECT imaging was performed after resting intravenous injection of 10 mCi Tc-32m tetrofosmin. Subsequently, intravenous infusion of Lexiscan was performed under the supervision of the Cardiology staff.  At peak effect of the drug, 30 mCi Tc-35m tetrofosmin was injected intravenously and standard myocardial SPECT  imaging was performed.  Quantitative gated imaging was also performed to evaluate left ventricular wall motion, and estimate left ventricular ejection fraction.  Comparison:  None.  Findings:  Spect:  No perfusion defects.  Wall motion:  Normal.  Ejection fraction:  78%.  IMPRESSION: No perfusion defects.  Original Report Authenticated By: Donavan Burnet, M.D.   ECHOCARDIOGRAM  ECHOCARDIOGRAM COMPLETE 03/13/2022  Narrative ECHOCARDIOGRAM REPORT    Patient Name:   [name] Date of Exam: 03/13/2022 Medical Rec #:  [id number]  Height:       66.0 in Accession #:    [id number]       Weight:       189.0 lb Date of Birth:  1934-10-20        BSA:          1.952 m Patient Age:    87 years         BP:           128/78 mmHg Patient Gender: F                HR:           61 bpm. Exam Location:  [address]  Procedure: 2D Echo, Cardiac Doppler, Color Doppler and 3D Echo  Indications:    Left  Bundle Branch Block I44.7  History:        Patient has prior history of Echocardiogram examinations, most recent 05/19/2016. Arrythmias:Atrial Fibrillation; Risk Factors:Hypertension.  Sonographer:    Thurman Coyer RDCS Referring Phys: [id number] HAO MENG   Sonographer Comments: Global longitudinal strain was attempted. IMPRESSIONS   1. Left ventricular ejection fraction, by estimation, is 55 to 60%. Left ventricular ejection fraction by 3D volume is 57 %. The left ventricle has normal function. The left ventricle has no regional wall motion abnormalities. Left ventricular diastolic parameters are consistent with Grade I diastolic dysfunction (impaired relaxation). 2. Right ventricular systolic function is normal. The right ventricular size is normal. There is normal pulmonary artery systolic pressure. The estimated right ventricular systolic pressure is 20.3 mmHg. 3. The mitral valve is normal in structure. Trivial mitral valve regurgitation. No evidence of mitral stenosis. 4. The aortic valve is tricuspid. Aortic valve regurgitation is not visualized. Aortic valve sclerosis/calcification is present, without any evidence of aortic stenosis. 5. The inferior vena cava is normal in size with greater than 50% respiratory variability, suggesting right atrial pressure of 3 mmHg.  FINDINGS Left Ventricle: Left ventricular ejection fraction, by estimation, is 55 to 60%. Left ventricular ejection fraction by 3D volume is 57 %. The left ventricle has normal function. The left ventricle has no regional wall motion abnormalities. The left ventricular internal cavity size was normal in size. There is no left ventricular hypertrophy. Left ventricular diastolic parameters are consistent with Grade I diastolic dysfunction (impaired relaxation). Normal left ventricular filling pressure.  Right Ventricle: The right ventricular size is normal. No increase in right ventricular wall thickness. Right ventricular  systolic function is normal. There is normal pulmonary artery systolic pressure. The tricuspid regurgitant velocity is 2.08 m/s, and with an assumed right atrial pressure of 3 mmHg, the estimated right ventricular systolic pressure is 20.3 mmHg.  Left Atrium: Left atrial size was normal in size.  Right Atrium: Right atrial size was normal in size.  Pericardium: There is no evidence of pericardial effusion.  Mitral Valve: The mitral valve is normal in structure. Trivial mitral valve regurgitation. No evidence of mitral valve stenosis.  Tricuspid Valve: The tricuspid valve is normal in structure. Tricuspid valve regurgitation is not demonstrated. No evidence of tricuspid stenosis.  Aortic Valve: The aortic valve is tricuspid. Aortic valve regurgitation is not visualized. Aortic valve sclerosis/calcification is present, without any evidence of aortic stenosis.  Pulmonic Valve: The pulmonic valve was normal in structure. Pulmonic valve regurgitation is trivial. No evidence of pulmonic stenosis.  Aorta: The aortic root is normal in size and structure.  Venous: The inferior vena cava is normal in size with greater than 50% respiratory variability, suggesting  right atrial pressure of 3 mmHg.  IAS/Shunts: No atrial level shunt detected by color flow Doppler.   LEFT VENTRICLE PLAX 2D LVIDd:         4.40 cm         Diastology LVIDs:         2.90 cm         LV e' medial:    4.73 cm/s LV PW:         1.00 cm         LV E/e' medial:  10.5 LV IVS:        1.00 cm         LV e' lateral:   6.22 cm/s LVOT diam:     2.00 cm         LV E/e' lateral: 8.0 LV SV:         68 LV SV Index:   35 LVOT Area:     3.14 cm        3D Volume EF LV 3D EF:    Left ventricul ar ejection fraction by 3D volume is 57 %.  3D Volume EF: 3D EF:        57 % LV EDV:       87 ml LV ESV:       37 ml LV SV:        49 ml  RIGHT VENTRICLE RV Basal diam:  3.60 cm RV Mid diam:    3.10 cm RV S prime:     9.55  cm/s TAPSE (M-mode): 2.3 cm  LEFT ATRIUM             Index        RIGHT ATRIUM           Index LA diam:        3.30 cm 1.69 cm/m   RA Area:     14.20 cm LA Vol (A2C):   57.8 ml 29.61 ml/m  RA Volume:   34.60 ml  17.72 ml/m LA Vol (A4C):   46.8 ml 23.95 ml/m LA Biplane Vol: 57.2 ml 29.30 ml/m AORTIC VALVE LVOT Vmax:   90.00 cm/s LVOT Vmean:  59.600 cm/s LVOT VTI:    0.217 m  AORTA Ao Root diam: 2.80 cm Ao Asc diam:  3.40 cm  MITRAL VALVE               TRICUSPID VALVE MV Area (PHT): 2.66 cm    TR Peak grad:   17.3 mmHg MV Decel Time: 285 msec    TR Vmax:        208.00 cm/s MV E velocity: 49.80 cm/s MV A velocity: 77.70 cm/s  SHUNTS MV E/A ratio:  0.64        Systemic VTI:  0.22 m Systemic Diam: 2.00 cm  Armanda Magic MD Electronically signed by Armanda Magic MD Signature Date/Time: 03/13/2022/11:20:36 AM    Final    MONITORS  LONG TERM MONITOR (3-14 DAYS) 06/15/2020  Narrative Multiple episodes of what appear to be an SVT.  Chrystie Nose, MD, [facility], FACP [facility] Medical Director of the [facility]ovascular [facility] Diplomate of the American Board of Clinical Lipidology Attending Cardiologist Direct Dial: [phone]  Fax: [phone] Website:  [email or web address]          Recent Labs: 03/11/2022: TSH 0.89 09/26/2022: ALT 11 10/19/2022: BUN 22; Creatinine, Ser 1.02; Hemoglobin 12.7; Platelets 260; Potassium 4.1; Sodium 128  Recent Lipid  Panel    Component Value Date/Time   CHOL 161 09/26/2022 0931   TRIG 101 09/26/2022 0931   HDL 68 09/26/2022 0931   CHOLHDL 2.4 09/26/2022 0931   CHOLHDL 2 09/16/2022 1104   VLDL 25.6 09/16/2022 1104   LDLCALC 75 09/26/2022 0931   LDLDIRECT 105.5 11/26/2010 0905    History of Present Illness    88 year old female with the above past medical history including nonobstructive CAD, LBBB, paroxysmal atrial fibrillation, PSVT, hypertension, hyperlipidemia, type 2  diabetes, and chronic venous insufficiency.  Myoview in the setting of chest pain in April 2012 showed EF 78%, no evidence of ischemia.  Due to persistent symptoms she eventually underwent cardiac catheterization in April 2012 that showed normal left main, 50 to 60% LAD stenosis, 40% apical lesion, normal left circumflex, 30 to 40% proximal RCA stenosis.  Echocardiogram at that time showed EF greater than 55%, mild aortic insufficiency, aneurysmal atrial septum.  Cardiac monitor in 2020 showed paroxysmal atrial fibrillation, low burden.  Repeat cardiac monitor in 06/2020 in the setting of palpitations despite titration of beta-blocker therapy revealed PSVT.  She was last seen in the office on 02/18/2022 and was stable from a cardiac standpoint.  She denied any significant palpitations.  EKG showed new LBBB.  Echocardiogram showed EF 55 to 60%, normal LV function, no RWMA, normal RV, no significant valvular abnormalities.  She was evaluated in the ED on 10/11/2022 and 10/19/2022 in the setting of palpitations.  During her most recent ED visit she was noted to be hyponatremic.  EKG showed sinus rhythm with LBBB.  Troponin was normal.  She received IV fluids and was advised to follow-up with cardiology as an outpatient.  She presents today for follow-up accompanied by her daughter-in-law. Since her last visit and recent ED visit he has been stable from a cardiac standpoint.  She notes that she was taking steroids for back pain and she thinks that this may have precipitated her increased palpitations.  She is no longer taking steroids and her palpitations have decreased in frequency.  She notes occasional palpitations, denies any associated symptoms.  She had an episode of chest discomfort earlier this morning that occurred while she was lying in bed, it lasted less than a minute. She denies any other chest pain, denies any exertional symptoms.   Home Medications    Current Outpatient Medications  Medication Sig  Dispense Refill   Accu-Chek Softclix Lancets lancets USE AS INSTRUCTED 100 each 3   acetaminophen (TYLENOL) 500 MG tablet Take 500 mg by mouth every 6 (six) hours as needed.     acetaminophen-codeine (TYLENOL/CODEINE #3) 300-30 MG tablet Take 0.5-1 tablets by mouth every 6 (six) hours as needed for severe pain. 20 tablet 0   Alcohol Swabs (DROPSAFE ALCOHOL PREP) 70 % PADS USE TO CLEAN INJECTION SITE TO CHECK BLOOD SUGARS DAILY 100 each 3   ALPRAZolam (XANAX) 0.5 MG tablet TAKE 1/2 TO 1 TABLET BY MOUTH THREE TIMES DAILY AS NEEDED FOR ANXIETY.  DO NOT TAKE WITH TRAMADOL. THIS IS A 30 DAY SUPPLY 90 tablet 3   amLODipine (NORVASC) 5 MG tablet TAKE 1 TABLET(5 MG) BY MOUTH DAILY 90 tablet 3   aspirin 81 MG tablet Take 81 mg by mouth daily.     Cholecalciferol (VITAMIN D3) 50 MCG (2000 UT) capsule TAKE 1 CAPSULE BY MOUTH EVERY DAY 100 capsule 3   diclofenac Sodium (VOLTAREN) 1 % GEL Apply 2 g topically 4 (four) times daily. 150 g 1  ezetimibe (ZETIA) 10 MG tablet Take 10 mg by mouth daily.     furosemide (LASIX) 20 MG tablet TAKE 1 TABLET(20 MG) BY MOUTH DAILY AS NEEDED FOR SWELLING 90 tablet 3   glucose blood (ACCU-CHEK AVIVA PLUS) test strip USE TO CHECK BLOOD SUGARS DAILY 100 strip 3   hydroxypropyl methylcellulose (ISOPTO TEARS) 2.5 % ophthalmic solution Place 1 drop into both eyes 2 (two) times daily.     hyoscyamine (LEVSIN) 0.125 MG tablet TAKE 1 TABLET BY MOUTH EVERY 6 HOURS AS NEEDED FOR UPTO 10 DAYS FOR CRAMPING 360 tablet 0   isosorbide mononitrate (IMDUR) 30 MG 24 hr tablet TAKE 1 TABLET BY MOUTH EVERY DAY 90 tablet 3   methylPREDNISolone (MEDROL DOSEPAK) 4 MG TBPK tablet As directed 21 tablet 0   metoprolol succinate (TOPROL-XL) 50 MG 24 hr tablet TAKE 1 AND 1/2 TABLETS BY MOUTH TWICE DAILY 270 tablet 3   pantoprazole (PROTONIX) 40 MG tablet TAKE 1 TABLET BY MOUTH EVERY DAY 90 tablet 1   simvastatin (ZOCOR) 20 MG tablet Take 1 tablet (20 mg total) by mouth at bedtime. 90 tablet 3    warfarin (COUMADIN) 5 MG tablet TAKE 1/2 TO 1 TABLET BY MOUTH DAILY AS DIRECTED BY COUMADIN CLINIC 90 tablet 1   No current facility-administered medications for this visit.     Review of Systems    She denies dyspnea, pnd, orthopnea, n, v, dizziness, syncope, edema, weight gain, or early satiety. All other systems reviewed and are otherwise negative except as noted above.   Physical Exam    VS:  BP 110/64   Pulse (!) 51   Ht 5\' 6"  (1.676 m)   Wt 179 lb 12.8 oz (81.6 kg)   SpO2 99%   BMI 29.02 kg/m   GEN: Well nourished, well developed, in no acute distress. HEENT: normal. Neck: Supple, no JVD, carotid bruits, or masses. Cardiac: RRR, no murmurs, rubs, or gallops. No clubbing, cyanosis, edema.  Radials/DP/PT 2+ and equal bilaterally.  Respiratory:  Respirations regular and unlabored, clear to auscultation bilaterally. GI: Soft, nontender, nondistended, BS + x 4. MS: no deformity or atrophy. Skin: warm and dry, no rash. Neuro:  Strength and sensation are intact. Psych: Normal affect.  Accessory Clinical Findings    ECG personally reviewed by me today -    - no acute changes.   Lab Results  Component Value Date   WBC 9.5 10/19/2022   HGB 12.7 10/19/2022   HCT 39.2 10/19/2022   MCV 89.3 10/19/2022   PLT 260 10/19/2022   Lab Results  Component Value Date   CREATININE 1.02 (H) 10/19/2022   BUN 22 10/19/2022   NA 128 (L) 10/19/2022   K 4.1 10/19/2022   CL 93 (L) 10/19/2022   CO2 28 10/19/2022   Lab Results  Component Value Date   ALT 11 09/26/2022   AST 16 09/26/2022   ALKPHOS 68 09/26/2022   BILITOT 0.4 09/26/2022   Lab Results  Component Value Date   CHOL 161 09/26/2022   HDL 68 09/26/2022   LDLCALC 75 09/26/2022   LDLDIRECT 105.5 11/26/2010   TRIG 101 09/26/2022   CHOLHDL 2.4 09/26/2022    Lab Results  Component Value Date   HGBA1C 7.2 (H) 09/16/2022    Assessment & Plan   1. Palpitations/PSVT: : Cardiac monitor in 2020 showed paroxysmal atrial  fibrillation, low burden. Repeat cardiac monitor in 06/2020 in the setting of palpitations despite titration of beta-blocker therapy revealed PSVT.  Echo in  02/2022 was stable. Recent worsening palpitations that coincided with steroid use.  Overall improved with discontinuation of steroids.  She denies any significant palpitations.  I do not think a repeat monitor would be beneficial at this point in time.  Should she have increased frequency or any associated symptoms, could consider repeat monitor.  Will update BMET, TSH, magnesium.  Reviewed ED precautions.  I advised her she may take an additional half tablet of metoprolol (no more than twice daily) as needed for palpitations. Continue metoprolol.   2. Paroxysmal atrial fibrillation: Historically low burden. Continue metoprolol, warfarin.   3. Nonobstructive CAD/LBBB: Cardiac catheterization in April 2012 showed normal left main, 50 to 60% LAD stenosis, 40% apical lesion, normal left circumflex, 30 to 40% proximal RCA stenosis. Echocardiogram in 02/2022 showed EF 55 to 60%, normal LV function, no RWMA, normal RV, no significant valvular abnormalities.  He had 1 episode of chest discomfort that occurred this morning while she was lying in bed, lasted for less than a minute.  She denies any other chest pain, denies any exertional symptoms.  Continue to monitor symptoms .  Reviewed ED precautions.  Continue aspirin, amlodipine, metoprolol, Imdur, simvastatin.  4. Hypertension: BP well controlled. Continue current antihypertensive regimen.   5. Hyperlipidemia: LDL was 75 in 09/2022.  She apparently did not tolerate a higher dose of simvastatin.  She also did not tolerate Zetia.  Continue simvastatin.   6. Type 2 diabetes: A1c was 7.2 in 09/2022. Monitored and managed per PCP.   7. Disposition: Follow-up in 3 months.       Joylene Grapes, NP 10/22/2022, 11:41 AM

## 2022-10-22 NOTE — Patient Instructions (Addendum)
Medication Instructions:  Your physician recommends that you continue on your current medications as directed. Please refer to the Current Medication list given to you today.  *If you need a refill on your cardiac medications before your next appointment, please call your pharmacy*   Lab Work: BMET, TSH, Magnesium    Testing/Procedures: NONE ordered at this time of appointment     Follow-Up: At [facility], you and your health needs are our priority.  As part of our continuing mission to provide you with exceptional heart care, we have created designated Provider Care Teams.  These Care Teams include your primary Cardiologist (physician) and Advanced Practice Providers (APPs -  Physician Assistants and Nurse Practitioners) who all work together to provide you with the care you need, when you need it.  We recommend signing up for the patient portal called "MyChart".  Sign up information is provided on this After Visit Summary.  MyChart is used to connect with patients for Virtual Visits (Telemedicine).  Patients are able to view lab/test results, encounter notes, upcoming appointments, etc.  Non-urgent messages can be sent to your provider as well.   To learn more about what you can do with MyChart, go to [email or web address].    Your next appointment:   3 month(s)  Provider:   Bernadene Person, NP

## 2022-10-23 LAB — BASIC METABOLIC PANEL
BUN/Creatinine Ratio: 14 (ref 12–28)
BUN: 16 mg/dL (ref 8–27)
CO2: 23 mmol/L (ref 20–29)
Calcium: 9.5 mg/dL (ref 8.7–10.3)
Chloride: 94 mmol/L — ABNORMAL LOW (ref 96–106)
Creatinine, Ser: 1.17 mg/dL — ABNORMAL HIGH (ref 0.57–1.00)
Glucose: 152 mg/dL — ABNORMAL HIGH (ref 70–99)
Potassium: 4.6 mmol/L (ref 3.5–5.2)
Sodium: 133 mmol/L — ABNORMAL LOW (ref 134–144)
eGFR: 45 mL/min/{1.73_m2} — ABNORMAL LOW (ref >59)

## 2022-10-23 LAB — MAGNESIUM: Magnesium: 2 mg/dL (ref 1.6–2.3)

## 2022-10-23 LAB — TSH: TSH: 1.66 u[IU]/mL (ref 0.450–4.500)

## 2022-10-24 ENCOUNTER — Telehealth: Payer: Self-pay

## 2022-10-24 NOTE — Telephone Encounter (Signed)
Spoke with pt. Pt was notified of lab results. Pt will reach out to her PCP in reference to the low sodium level seen on pts blood work. Pt will continue current medication and f/u as planned.

## 2022-10-27 ENCOUNTER — Telehealth: Payer: Self-pay

## 2022-10-27 NOTE — Telephone Encounter (Signed)
 Transition Care Management Unsuccessful Follow-up Telephone Call  Date of discharge and from where:  [name] 7/6  Attempts:  1st Attempt  Reason for unsuccessful TCM follow-up call:  No answer/busy   [name] [facility] Guide, [facility] [phone] [address] [place], [place], [place] [address] Phone: [phone] Email: [email or web address]

## 2022-10-27 NOTE — Telephone Encounter (Signed)
Transition Care Management Unsuccessful Follow-up Telephone Call  Date of discharge and from where:  [name] 7/7  Attempts:  2nd Attempt  Reason for unsuccessful TCM follow-up call:  No answer/busy   [name] [facility] Guide, [facility] [phone] [address] [place], [place], [place] [address] Phone: [phone] Email: [email or web address]

## 2022-10-28 ENCOUNTER — Ambulatory Visit: Payer: Medicare HMO | Attending: Internal Medicine | Admitting: *Deleted

## 2022-10-28 DIAGNOSIS — I480 Paroxysmal atrial fibrillation: Secondary | ICD-10-CM | POA: Diagnosis not present

## 2022-10-28 DIAGNOSIS — Z7901 Long term (current) use of anticoagulants: Secondary | ICD-10-CM

## 2022-10-28 LAB — POCT INR: INR: 2.5 (ref 2.0–3.0)

## 2022-10-28 NOTE — Patient Instructions (Signed)
Description   Continue taking Warfarin 1 tablet daily.  Repeat INR in 6 weeks. Call [phone] with an warfarin related questions.  *Consistent amount of greens every week*       

## 2022-10-31 ENCOUNTER — Ambulatory Visit: Payer: Self-pay

## 2022-10-31 NOTE — Patient Outreach (Signed)
  Care Coordination   Initial Visit Note   10/31/2022 Name: [name] MRN: [id number] DOB: Mar 05, 1935  [name] is a 88 y.o. year old female who sees Wiggins, Kelly Quint, MD for primary care. I spoke with  [name] by phone today.  What matters to the patients health and wellness today?  ED 10/11/22 and 10/19/22 for palpitations. She reports she is doing better and palpitations are better. She has followed up with PCP and cardiologist. She has an upcoming appointment with PCP on 11/10/22. She reports she drinks ten 16 ounce bottles of water a day and questions if that is too much water. She states she has not had any swelling and has not had to take furosemide. She reports she will discuss with PCP at next office visit on 11/10/22.  Goals Addressed             This Visit's Progress    Assist with health management-continue to improve post hospitalization       Interventions Today    Flowsheet Row Most Recent Value  Chronic Disease   Chronic disease during today's visit Other  [post hospitalization: palpitaions,  chronic back pain]  General Interventions   General Interventions Discussed/Reviewed General Interventions Discussed, Doctor Visits  Doctor Visits Discussed/Reviewed Doctor Visits Discussed, PCP  PCP/Specialist Visits Compliance with follow-up visit  [reviewed upcoming/scheduled appointments]  Communication with PCP/Specialists  [re: patient question regarding amount of fluid she is drinking.]  Exercise Interventions   Exercise Discussed/Reviewed Physical Activity  [encouraged to remain as active as tolerated]  Education Interventions   Education Provided Provided Education  Provided Verbal Education On Other, When to see the doctor, Exercise, Medication  [advised to take medications as recommended, contact provider for health questions or concerns, attend provider visits as scheduled]  Nutrition Interventions   Nutrition Discussed/Reviewed Nutrition  Discussed  Pharmacy Interventions   Pharmacy Dicussed/Reviewed Pharmacy Topics Discussed  [medication reviewed, reiterated that alprazalam and medication with codeine should not be taken at the same time]  Safety Interventions   Safety Discussed/Reviewed Safety Discussed            SDOH assessments and interventions completed:  No   Care Coordination Interventions:  Yes, provided   Follow up plan:  11/25/22    Encounter Outcome:  Pt. Visit Completed   Kelly Sheriff, RN, MSN, BSN, CCM [facility] Care Coordinator [phone]

## 2022-10-31 NOTE — Patient Instructions (Signed)
Visit Information  Thank you for taking time to visit with me today. Please don't hesitate to contact me if I can be of assistance to you.   Following are the goals we discussed today:  Attend provider visits as scheduled Take medications as prescribed Contact provider with health questions or concerns as needed   Our next appointment is by telephone on 11/25/22 at 11:30 am  Please call the care guide team at [phone] if you need to cancel or reschedule your appointment.   If you are experiencing a Mental Health or Behavioral Health Crisis or need someone to talk to, please call the Suicide and Crisis Lifeline: 80  Kathyrn Sheriff, RN, MSN, BSN, CCM [facility] Care Coordinator [phone]

## 2022-11-09 IMAGING — CR DG CHEST 2V
2 series · 2 of 2 positions shown · non-contrast
Comparison: Chest x-ray 05/12/2020, CT chest 06/25/2009

CLINICAL DATA: Shortness of breath. [id number] infection on
05/01/2020

EXAM:
CHEST - 2 VIEW

[w chest pa]
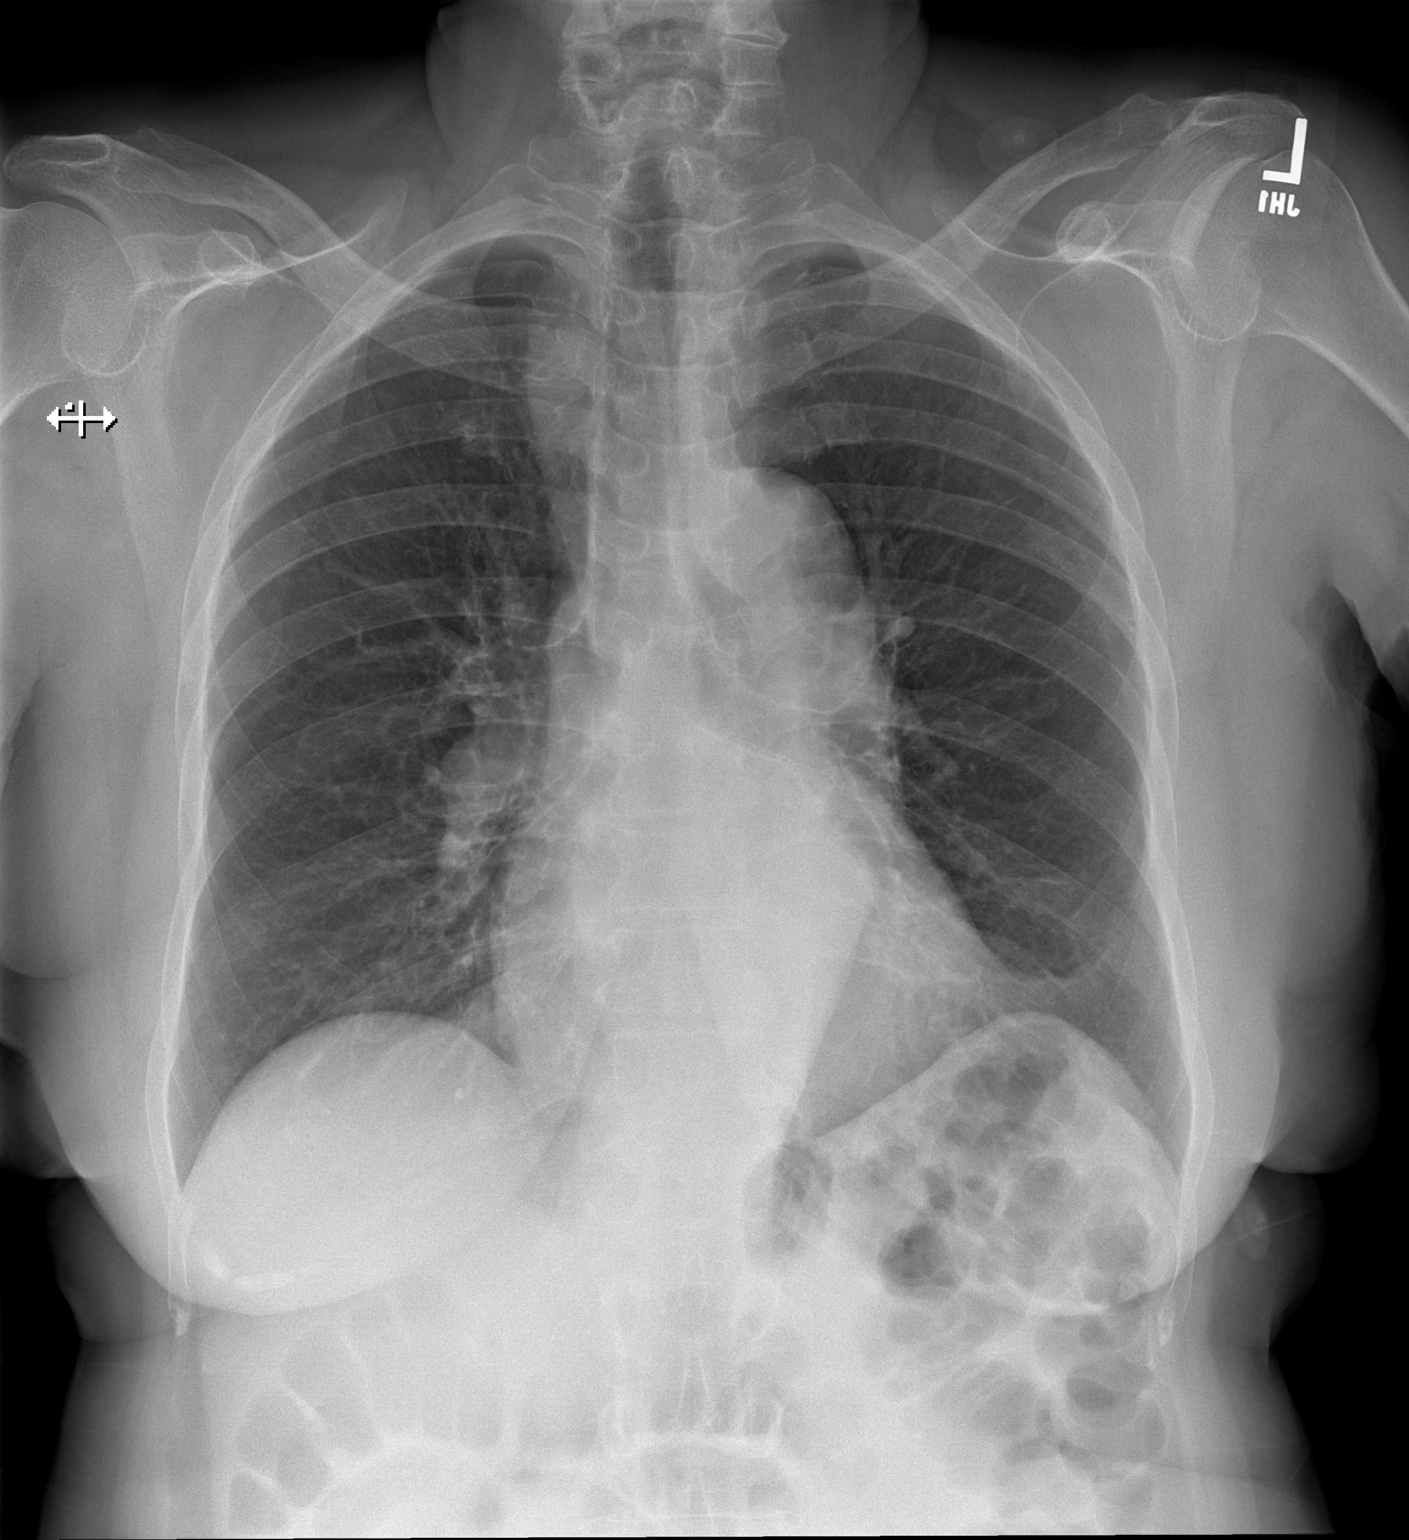

[w chest lat]
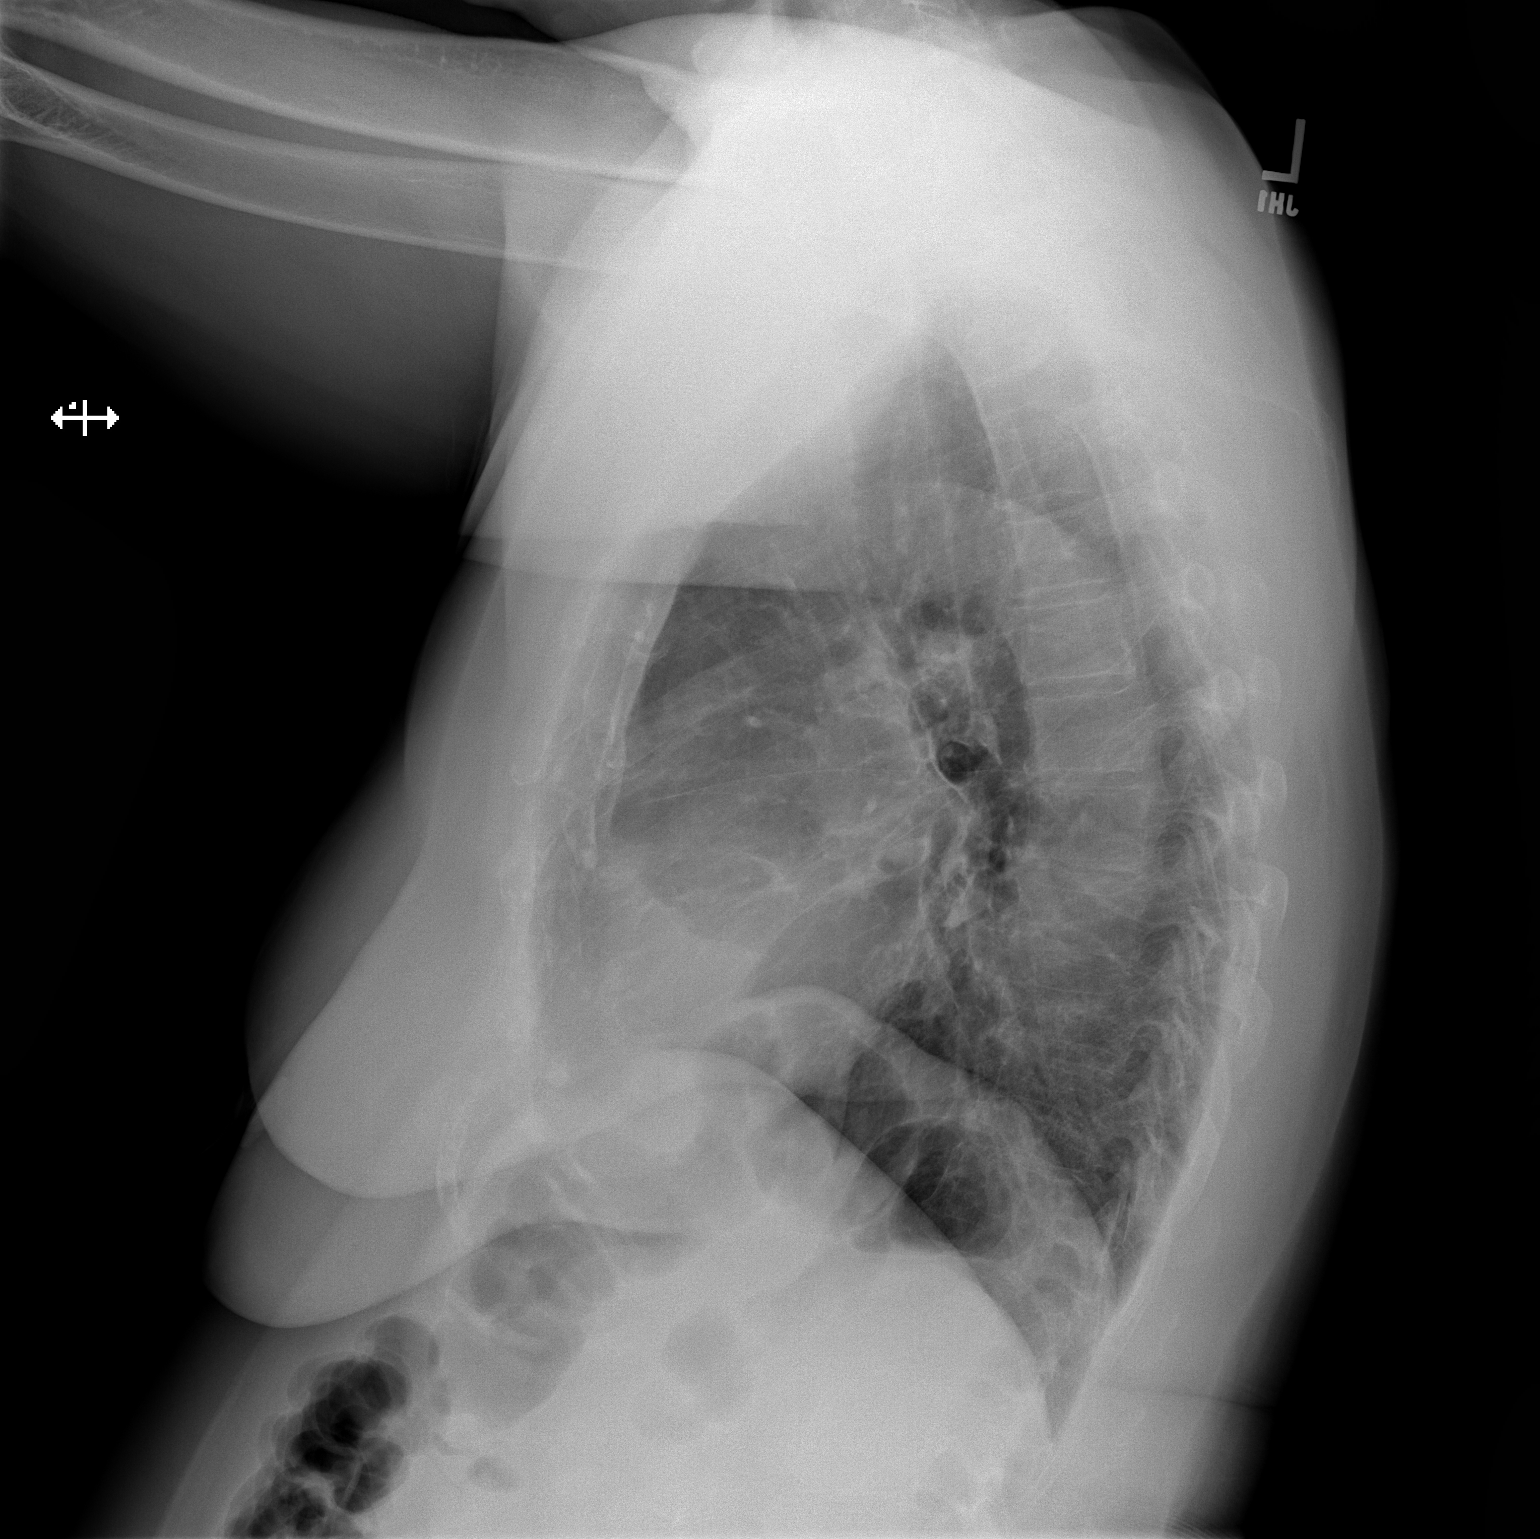

[2 of 2 positions shown; findings below may reference images not displayed]

FINDINGS: The heart size and mediastinal contours are unchanged. Elevated left
hemidiaphragm.

No focal consolidation. No pulmonary edema. No pleural effusion. No
pneumothorax.

No acute osseous abnormality.
IMPRESSION: No active cardiopulmonary disease.

## 2022-11-10 ENCOUNTER — Encounter: Payer: Self-pay | Admitting: Internal Medicine

## 2022-11-10 ENCOUNTER — Other Ambulatory Visit: Payer: Self-pay | Admitting: Internal Medicine

## 2022-11-10 ENCOUNTER — Ambulatory Visit (INDEPENDENT_AMBULATORY_CARE_PROVIDER_SITE_OTHER): Payer: Medicare HMO | Admitting: Internal Medicine

## 2022-11-10 VITALS — BP 118/78 | HR 71 | Temp 98.6°F | Ht 66.0 in | Wt 188.0 lb

## 2022-11-10 DIAGNOSIS — R609 Edema, unspecified: Secondary | ICD-10-CM

## 2022-11-10 DIAGNOSIS — M545 Low back pain, unspecified: Secondary | ICD-10-CM | POA: Diagnosis not present

## 2022-11-10 DIAGNOSIS — E119 Type 2 diabetes mellitus without complications: Secondary | ICD-10-CM | POA: Diagnosis not present

## 2022-11-10 DIAGNOSIS — I2510 Atherosclerotic heart disease of native coronary artery without angina pectoris: Secondary | ICD-10-CM

## 2022-11-10 DIAGNOSIS — S32010D Wedge compression fracture of first lumbar vertebra, subsequent encounter for fracture with routine healing: Secondary | ICD-10-CM

## 2022-11-10 DIAGNOSIS — E871 Hypo-osmolality and hyponatremia: Secondary | ICD-10-CM

## 2022-11-10 DIAGNOSIS — I2583 Coronary atherosclerosis due to lipid rich plaque: Secondary | ICD-10-CM | POA: Diagnosis not present

## 2022-11-10 DIAGNOSIS — S32010A Wedge compression fracture of first lumbar vertebra, initial encounter for closed fracture: Secondary | ICD-10-CM | POA: Insufficient documentation

## 2022-11-10 NOTE — Patient Instructions (Addendum)
Cut back on water - drink<5 bottles/day  Labs in 1-2 weeks

## 2022-11-10 NOTE — Assessment & Plan Note (Signed)
Pt gained 9 lbs - possibly an error

## 2022-11-10 NOTE — Assessment & Plan Note (Addendum)
D/c T#3 OK to take Tylenol A mild compression fracture of lumbar vertebra L1 which is likely responsible for the pain - resolved Vit D

## 2022-11-10 NOTE — Progress Notes (Signed)
Subjective:  Patient ID: [name], female    DOB: Sep 08, 1934  Age: 88 y.o. MRN: [id number]  CC: Hospitalization Follow-up (Latest labs by cardiology showed low-sodium levels was informed to f/u with PCP)   HPI [name] presents for low sodium 2 wks ago The pt drinks about 10 16 oz bottles of water a day (4.7 L/day) for her kidneys F/u LBP Outpatient Medications Prior to Visit  Medication Sig Dispense Refill   ACCU-CHEK AVIVA PLUS test strip USE TO CHECK BLOOD SUGARS DAILY 100 strip 3   Accu-Chek Softclix Lancets lancets USE AS INSTRUCTED 100 each 3   acetaminophen (TYLENOL) 500 MG tablet Take 500 mg by mouth every 6 (six) hours as needed.     Alcohol Swabs (DROPSAFE ALCOHOL PREP) 70 % PADS USE TO CLEAN INJECTION SITE TO CHECK BLOOD SUGARS DAILY 100 each 3   ALPRAZolam (XANAX) 0.5 MG tablet TAKE 1/2 TO 1 TABLET BY MOUTH THREE TIMES DAILY AS NEEDED FOR ANXIETY.  DO NOT TAKE WITH TRAMADOL. THIS IS A 30 DAY SUPPLY 90 tablet 3   amLODipine (NORVASC) 5 MG tablet TAKE 1 TABLET(5 MG) BY MOUTH DAILY 90 tablet 3   aspirin 81 MG tablet Take 81 mg by mouth daily.     Cholecalciferol (VITAMIN D3) 50 MCG (2000 UT) capsule TAKE 1 CAPSULE BY MOUTH EVERY DAY 100 capsule 3   diclofenac Sodium (VOLTAREN) 1 % GEL Apply 2 g topically 4 (four) times daily. 150 g 1   ezetimibe (ZETIA) 10 MG tablet Take 10 mg by mouth daily.     furosemide (LASIX) 20 MG tablet TAKE 1 TABLET(20 MG) BY MOUTH DAILY AS NEEDED FOR SWELLING 90 tablet 3   hydroxypropyl methylcellulose (ISOPTO TEARS) 2.5 % ophthalmic solution Place 1 drop into both eyes 2 (two) times daily.     hyoscyamine (LEVSIN) 0.125 MG tablet TAKE 1 TABLET BY MOUTH EVERY 6 HOURS AS NEEDED FOR UPTO 10 DAYS FOR CRAMPING 360 tablet 0   isosorbide mononitrate (IMDUR) 30 MG 24 hr tablet TAKE 1 TABLET BY MOUTH EVERY DAY 90 tablet 3   metoprolol succinate (TOPROL-XL) 50 MG 24 hr tablet TAKE 1 AND 1/2 TABLETS BY MOUTH TWICE DAILY 270 tablet 3    pantoprazole (PROTONIX) 40 MG tablet TAKE 1 TABLET BY MOUTH EVERY DAY 90 tablet 1   simvastatin (ZOCOR) 20 MG tablet Take 1 tablet (20 mg total) by mouth at bedtime. 90 tablet 3   warfarin (COUMADIN) 5 MG tablet TAKE 1/2 TO 1 TABLET BY MOUTH DAILY AS DIRECTED BY COUMADIN CLINIC 90 tablet 1   acetaminophen-codeine (TYLENOL/CODEINE #3) 300-30 MG tablet Take 0.5-1 tablets by mouth every 6 (six) hours as needed for severe pain. 20 tablet 0   methylPREDNISolone (MEDROL DOSEPAK) 4 MG TBPK tablet As directed 21 tablet 0   No facility-administered medications prior to visit.    ROS: Review of Systems  Constitutional:  Negative for activity change, appetite change, chills, fatigue and unexpected weight change.  HENT:  Negative for congestion, mouth sores and sinus pressure.   Eyes:  Negative for visual disturbance.  Respiratory:  Negative for cough and chest tightness.   Gastrointestinal:  Negative for abdominal pain and nausea.  Genitourinary:  Negative for difficulty urinating, frequency and vaginal pain.  Musculoskeletal:  Positive for arthralgias. Negative for back pain and gait problem.  Skin:  Negative for pallor and rash.  Neurological:  Negative for dizziness, tremors, weakness, light-headedness, numbness and headaches.  Psychiatric/Behavioral:  Negative for confusion  and sleep disturbance.     Objective:  BP 118/78 (BP Location: Right Arm, Patient Position: Sitting, Cuff Size: Normal)   Pulse 71   Temp 98.6 F (37 C) (Oral)   Ht 5\' 6"  (1.676 m)   Wt 188 lb (85.3 kg)   SpO2 98%   BMI 30.34 kg/m   BP Readings from Last 3 Encounters:  11/10/22 118/78  10/22/22 110/64  10/19/22 (!) 168/83    Wt Readings from Last 3 Encounters:  11/10/22 188 lb (85.3 kg)  10/22/22 179 lb 12.8 oz (81.6 kg)  10/14/22 182 lb (82.6 kg)    Physical Exam Constitutional:      General: She is not in acute distress.    Appearance: She is well-developed. She is obese.  HENT:     Head:  Normocephalic.     Right Ear: External ear normal.     Left Ear: External ear normal.     Nose: Nose normal.  Eyes:     General:        Right eye: No discharge.        Left eye: No discharge.     Conjunctiva/sclera: Conjunctivae normal.     Pupils: Pupils are equal, round, and reactive to light.  Neck:     Thyroid: No thyromegaly.     Vascular: No JVD.     Trachea: No tracheal deviation.  Cardiovascular:     Rate and Rhythm: Normal rate and regular rhythm.     Heart sounds: Normal heart sounds.  Pulmonary:     Effort: No respiratory distress.     Breath sounds: No stridor. No wheezing.  Abdominal:     General: Bowel sounds are normal. There is no distension.     Palpations: Abdomen is soft. There is no mass.     Tenderness: There is no abdominal tenderness. There is no guarding or rebound.  Musculoskeletal:        General: No tenderness.     Cervical back: Normal range of motion and neck supple. No rigidity.  Lymphadenopathy:     Cervical: No cervical adenopathy.  Skin:    Findings: No erythema or rash.  Neurological:     Cranial Nerves: No cranial nerve deficit.     Motor: No abnormal muscle tone.     Coordination: Coordination normal.     Gait: Gait abnormal.     Deep Tendon Reflexes: Reflexes normal.  Psychiatric:        Behavior: Behavior normal.        Thought Content: Thought content normal.        Judgment: Judgment normal.   B LEs w/trace edema  Lab Results  Component Value Date   WBC 9.5 10/19/2022   HGB 12.7 10/19/2022   HCT 39.2 10/19/2022   PLT 260 10/19/2022   GLUCOSE 152 (H) 10/22/2022   CHOL 161 09/26/2022   TRIG 101 09/26/2022   HDL 68 09/26/2022   LDLDIRECT 105.5 11/26/2010   LDLCALC 75 09/26/2022   ALT 11 09/26/2022   AST 16 09/26/2022   NA 133 (L) 10/22/2022   K 4.6 10/22/2022   CL 94 (L) 10/22/2022   CREATININE 1.17 (H) 10/22/2022   BUN 16 10/22/2022   CO2 23 10/22/2022   TSH 1.660 10/22/2022   INR 2.5 10/28/2022   HGBA1C 7.2 (H)  09/16/2022    No results found.  Assessment & Plan:   Problem List Items Addressed This Visit     DM2 (diabetes mellitus, type 2) (  HCC)    On diet Check A1c      Relevant Orders   Comprehensive metabolic panel   CAD (coronary artery disease)    Cont on Simvastatin, Toprol, Isosorbide, Amlodipine      Edema    Pt gained 9 lbs - possibly an error      Low back pain    D/c T#3 OK to take Tylenol A mild compression fracture of lumbar vertebra L1 which is likely responsible for the pain - resolved Vit D      Hyponatremia - Primary    The pt drinks about 10 16 oz bottles of water a day (4.7 L/day) for her kidneys Cut back on water - drink<5 bottles/day Labs in 2 weeks D/c T#3 Monitor CBGs      Relevant Orders   Comprehensive metabolic panel   Compression fracture of L1 lumbar vertebra (HCC)    A mild compression fracture of lumbar vertebra L1 w/pain - pain resolved Vit D         No orders of the defined types were placed in this encounter.     Follow-up: Return in about 6 weeks (around 12/22/2022).  Sonda Primes, MD

## 2022-11-10 NOTE — Assessment & Plan Note (Signed)
Cont on Simvastatin, Toprol, Isosorbide, Amlodipine °

## 2022-11-10 NOTE — Assessment & Plan Note (Signed)
On diet Check A1c 

## 2022-11-10 NOTE — Assessment & Plan Note (Addendum)
The pt drinks about 10 16 oz bottles of water a day (4.7 L/day) for her kidneys Cut back on water - drink<5 bottles/day Labs in 2 weeks D/c T#3 Monitor CBGs

## 2022-11-10 NOTE — Assessment & Plan Note (Signed)
A mild compression fracture of lumbar vertebra L1 w/pain - pain resolved Vit D

## 2022-11-12 ENCOUNTER — Telehealth: Payer: Self-pay | Admitting: Internal Medicine

## 2022-11-12 NOTE — Telephone Encounter (Signed)
Tried calling pt no answer and can't leave vm.Marland KitchenRaechel Chute

## 2022-11-12 NOTE — Telephone Encounter (Signed)
I have her mobile number [phone] if you haven't tried yet.

## 2022-11-12 NOTE — Telephone Encounter (Signed)
Pt called in wanting a nurse to give her a called back stating that blood sugar level has been running around 160-170 for the last past 2 days pt been feeling a little light headed and nervous. Please advise.

## 2022-11-13 NOTE — Telephone Encounter (Signed)
Called pt she states tht her BS been running in the 160-170's. This morning her BS was 178. She states she is not taking anything for diabetes. She just keeps a check on it.Marland KitchenRaechel Chute

## 2022-11-13 NOTE — Telephone Encounter (Signed)
Patient called back and said she was told she would get a call back. She wanted to know if she could get that call back at [phone].

## 2022-11-14 NOTE — Telephone Encounter (Signed)
Called pt back to see how her BS is running. Pt states last night it was back to normal at 130 and this am it was 124. Inform pt two take BS twice a day so MD can know her readings. If her BS start to get high again she will need a appt to be evaluated.Marland KitchenRaechel Chute

## 2022-11-18 DIAGNOSIS — Z0100 Encounter for examination of eyes and vision without abnormal findings: Secondary | ICD-10-CM | POA: Diagnosis not present

## 2022-11-18 NOTE — Telephone Encounter (Signed)
Noted! Thank you

## 2022-11-25 ENCOUNTER — Ambulatory Visit: Payer: Self-pay

## 2022-11-25 NOTE — Patient Instructions (Signed)
Visit Information  Thank you for taking time to visit with me today. Please don't hesitate to contact me if I can be of assistance to you.   Following are the goals we discussed today:   Goals Addressed             This Visit's Progress    Assist with health management-continue to improve post hospitalization       Interventions Today    Flowsheet Row Most Recent Value  Chronic Disease   Chronic disease during today's visit Diabetes, Other  [post hospitalization,  chronic back pain]  General Interventions   General Interventions Discussed/Reviewed General Interventions Reviewed, Communication with  Communication with PCP/Specialists  [request prescription for new glucose meter same meter that she currently uses.]  Exercise Interventions   Exercise Discussed/Reviewed Physical Activity  Physical Activity Discussed/Reviewed Physical Activity Discussed  [reports remains physically active. reports uses a "walking stick", but does not need all the time.]  Education Interventions   Education Provided Provided Education  Provided Verbal Education On Medication, Other, Blood Sugar Monitoring  [advised to take medications as prescribed, attend provider visits as scheduled, reviewed instructions per lated primary provider office visit on 11/10/22.]  Pharmacy Interventions   Pharmacy Dicussed/Reviewed Pharmacy Topics Reviewed            Our next appointment is by telephone on 12/29/22 at 11:30 am  Please call the care guide team at [phone] if you need to cancel or reschedule your appointment.   If you are experiencing a Mental Health or Behavioral Health Crisis or need someone to talk to, please call the Suicide and Crisis Lifeline: 988 call the [place] National Suicide Prevention Lifeline: 217-243-9660 or TTY: 760 707 2595 TTY 639 453 7588) to talk to a trained counselor call 1-800-273-TALK (toll free, 24 hour hotline)  Kathyrn Sheriff, RN, MSN, BSN, CCM [facility] Care  Coordinator [phone]

## 2022-11-25 NOTE — Patient Outreach (Signed)
  Care Coordination   Follow Up Visit Note   11/25/2022 Name: NARYAH PLATT MRN: [id number] DOB: [birth year]  ANGELLINA RUPNOW is a 88 y.o. year old female who sees Plotnikov, Georgina Quint, MD for primary care. I spoke with  [name] by phone today.  What matters to the patients health and wellness today?  Ms. Guagenti reports her back is "much better". She reports she needs a new meter, her current meter is >76 years old and sometimes it does not work. She would like the same meter brand/type. She confirms that she has cut back on her water to <5/day and she is planning to go to have labs drawn this week as scheduled.    Goals Addressed             This Visit's Progress    Assist with health management-continue to improve post hospitalization       Interventions Today    Flowsheet Row Most Recent Value  Chronic Disease   Chronic disease during today's visit Diabetes, Other  [post hospitalization,  chronic back pain]  General Interventions   General Interventions Discussed/Reviewed General Interventions Reviewed, Communication with  Communication with PCP/Specialists  [request prescription for new glucose meter same meter that she currently uses.]  Exercise Interventions   Exercise Discussed/Reviewed Physical Activity  Physical Activity Discussed/Reviewed Physical Activity Discussed  [reports remains physically active. reports uses a "walking stick", but does not need all the time.]  Education Interventions   Education Provided Provided Education  Provided Verbal Education On Medication, Other, Blood Sugar Monitoring  [advised to take medications as prescribed, attend provider visits as scheduled, reviewed instructions per lated primary provider office visit on 11/10/22.]  Pharmacy Interventions   Pharmacy Dicussed/Reviewed Pharmacy Topics Reviewed            SDOH assessments and interventions completed:  No  Care Coordination Interventions:  Yes, provided   Follow up  plan: Follow up call scheduled for 12/29/22    Encounter Outcome:  Pt. Visit Completed   Kathyrn Sheriff, RN, MSN, BSN, CCM [facility] Care Coordinator [phone]

## 2022-11-27 ENCOUNTER — Other Ambulatory Visit (INDEPENDENT_AMBULATORY_CARE_PROVIDER_SITE_OTHER): Payer: Medicare HMO

## 2022-11-27 DIAGNOSIS — I2510 Atherosclerotic heart disease of native coronary artery without angina pectoris: Secondary | ICD-10-CM | POA: Diagnosis not present

## 2022-11-27 DIAGNOSIS — E871 Hypo-osmolality and hyponatremia: Secondary | ICD-10-CM | POA: Diagnosis not present

## 2022-11-27 DIAGNOSIS — E785 Hyperlipidemia, unspecified: Secondary | ICD-10-CM

## 2022-11-27 DIAGNOSIS — E119 Type 2 diabetes mellitus without complications: Secondary | ICD-10-CM

## 2022-11-27 LAB — COMPREHENSIVE METABOLIC PANEL
ALT: 12 U/L (ref 0–35)
AST: 18 U/L (ref 0–37)
Albumin: 4.2 g/dL (ref 3.5–5.2)
Alkaline Phosphatase: 64 U/L (ref 39–117)
BUN: 17 mg/dL (ref 6–23)
CO2: 28 mEq/L (ref 19–32)
Calcium: 9.1 mg/dL (ref 8.4–10.5)
Chloride: 98 mEq/L (ref 96–112)
Creatinine, Ser: 1.11 mg/dL (ref 0.40–1.20)
GFR: 45.12 mL/min — ABNORMAL LOW (ref >60.00)
Glucose, Bld: 175 mg/dL — ABNORMAL HIGH (ref 70–99)
Potassium: 3.8 mEq/L (ref 3.5–5.1)
Sodium: 131 mEq/L — ABNORMAL LOW (ref 135–145)
Total Bilirubin: 0.4 mg/dL (ref 0.2–1.2)
Total Protein: 6.9 g/dL (ref 6.0–8.3)

## 2022-11-27 LAB — LIPID PANEL
Cholesterol: 163 mg/dL (ref 0–200)
HDL: 57.3 mg/dL (ref >39.00)
LDL Cholesterol: 76 mg/dL (ref 0–99)
NonHDL: 105.78
Total CHOL/HDL Ratio: 3
Triglycerides: 149 mg/dL (ref 0.0–149.0)
VLDL: 29.8 mg/dL (ref 0.0–40.0)

## 2022-12-02 MED ORDER — ACCU-CHEK AVIVA PLUS W/DEVICE KIT: PACK | 0 refills | Status: DC

## 2022-12-02 NOTE — Progress Notes (Signed)
Notified pt w/MD response on labs. Pt states she need a new BS monitor. Verified which one sent Accu-chek [employer].Marland KitchenRaechel Chute

## 2022-12-06 ENCOUNTER — Other Ambulatory Visit: Payer: Self-pay | Admitting: Internal Medicine

## 2022-12-08 ENCOUNTER — Telehealth: Payer: Self-pay | Admitting: Internal Medicine

## 2022-12-08 NOTE — Telephone Encounter (Signed)
Can someone reach out to pt about her sugar monitor and strips. I am not understanding what she is wanting. Please advise.

## 2022-12-08 NOTE — Telephone Encounter (Signed)
Called pt inform her that the then Accu-chek did not fix her pharmacy. Inform pt was her insurance # She states her insurance is   [employer] HMO

## 2022-12-09 ENCOUNTER — Ambulatory Visit: Payer: Medicare HMO

## 2022-12-09 ENCOUNTER — Telehealth: Payer: Self-pay

## 2022-12-09 MED ORDER — ACCU-CHEK AVIVA PLUS VI STRP: ORAL_STRIP | 3 refills | Status: DC

## 2022-12-09 MED ORDER — ACCU-CHEK SOFTCLIX LANCETS MISC: 3 refills | Status: DC

## 2022-12-09 NOTE — Telephone Encounter (Signed)
Lpmtcb and reschedule INR check 

## 2022-12-09 NOTE — Telephone Encounter (Signed)
Called pharmacy she states the has to send in for testing strips and the testing supplies. Called pt inform her MD resent act to pharmacy.Marland KitchenShearon Stalls

## 2022-12-09 NOTE — Telephone Encounter (Signed)
Patient needs softclix lancets sent in to pharmacy

## 2022-12-10 NOTE — Telephone Encounter (Signed)
That she needs her diabetic supplies renewed?  It is okay.  Thank you

## 2022-12-18 ENCOUNTER — Ambulatory Visit: Payer: Medicare HMO | Attending: Internal Medicine

## 2022-12-18 DIAGNOSIS — Z7901 Long term (current) use of anticoagulants: Secondary | ICD-10-CM | POA: Diagnosis not present

## 2022-12-18 DIAGNOSIS — I480 Paroxysmal atrial fibrillation: Secondary | ICD-10-CM

## 2022-12-18 LAB — POCT INR: INR: 2.9 (ref 2.0–3.0)

## 2022-12-18 NOTE — Patient Instructions (Signed)
Description   Continue taking Warfarin 1 tablet daily.  Repeat INR in 6 weeks. Call [phone] with an warfarin related questions.  *Consistent amount of greens every week*       

## 2022-12-23 ENCOUNTER — Encounter: Payer: Self-pay | Admitting: Internal Medicine

## 2022-12-23 ENCOUNTER — Ambulatory Visit (INDEPENDENT_AMBULATORY_CARE_PROVIDER_SITE_OTHER): Payer: Medicare HMO | Admitting: Internal Medicine

## 2022-12-23 VITALS — BP 118/70 | HR 62 | Temp 98.3°F | Ht 66.0 in | Wt 188.0 lb

## 2022-12-23 DIAGNOSIS — Z7901 Long term (current) use of anticoagulants: Secondary | ICD-10-CM

## 2022-12-23 DIAGNOSIS — E119 Type 2 diabetes mellitus without complications: Secondary | ICD-10-CM

## 2022-12-23 DIAGNOSIS — I480 Paroxysmal atrial fibrillation: Secondary | ICD-10-CM | POA: Diagnosis not present

## 2022-12-23 DIAGNOSIS — M47816 Spondylosis without myelopathy or radiculopathy, lumbar region: Secondary | ICD-10-CM | POA: Diagnosis not present

## 2022-12-23 DIAGNOSIS — I10 Essential (primary) hypertension: Secondary | ICD-10-CM | POA: Diagnosis not present

## 2022-12-23 NOTE — Assessment & Plan Note (Signed)
On Coumadin, Toprol XL CHA2DS2VASc = 4-->coumadin.  Potential benefits of a long term Coumadin use as well as potential risks  and complications were explained to the patient and were aknowledged. Pt agreed to get Prevnar 20

## 2022-12-23 NOTE — Assessment & Plan Note (Signed)
Better  CBG 114-154 at home

## 2022-12-23 NOTE — Assessment & Plan Note (Signed)
Chronic Cont on Metoprolol, Norvasc

## 2022-12-23 NOTE — Assessment & Plan Note (Addendum)
Better Not using a cane lately

## 2022-12-23 NOTE — Progress Notes (Signed)
Subjective:  Patient ID: [name], female    DOB: 05/30/1934  Age: 88 y.o. MRN: [id number]  CC: Follow-up (6 WEEK F/U)   HPI ALAZIA SCHURIG presents for DM, HTN, LBP/OA f/u LBP is better  CBG 114-154 at home  Outpatient Medications Prior to Visit  Medication Sig Dispense Refill   Accu-Chek Softclix Lancets lancets USE  AS  INSTRUCTED 100 each 3   acetaminophen (TYLENOL) 500 MG tablet Take 500 mg by mouth every 6 (six) hours as needed.     Alcohol Swabs (DROPSAFE ALCOHOL PREP) 70 % PADS USE TO CLEAN INJECTION SITE TO CHECK BLOOD SUGARS DAILY 100 each 3   ALPRAZolam (XANAX) 0.5 MG tablet TAKE 1/2 TO 1 TABLET BY MOUTH THREE TIMES DAILY AS NEEDED FOR ANXIETY.  DO NOT TAKE WITH TRAMADOL. THIS IS A 30 DAY SUPPLY 90 tablet 3   amLODipine (NORVASC) 5 MG tablet TAKE 1 TABLET(5 MG) BY MOUTH DAILY 90 tablet 3   aspirin 81 MG tablet Take 81 mg by mouth daily.     Blood Glucose Monitoring Suppl (ACCU-CHEK AVIVA PLUS) w/Device KIT Use to check blood sugar twice a day 1 kit 0   Cholecalciferol (VITAMIN D3) 50 MCG (2000 UT) capsule TAKE 1 CAPSULE BY MOUTH EVERY DAY 100 capsule 3   diclofenac Sodium (VOLTAREN) 1 % GEL Apply 2 g topically 4 (four) times daily. 150 g 1   ezetimibe (ZETIA) 10 MG tablet Take 10 mg by mouth daily.     furosemide (LASIX) 20 MG tablet TAKE 1 TABLET(20 MG) BY MOUTH DAILY AS NEEDED FOR SWELLING 90 tablet 3   glucose blood (ACCU-CHEK AVIVA PLUS) test strip Use as instructed 100 strip 3   hydroxypropyl methylcellulose (ISOPTO TEARS) 2.5 % ophthalmic solution Place 1 drop into both eyes 2 (two) times daily.     hyoscyamine (LEVSIN) 0.125 MG tablet TAKE 1 TABLET BY MOUTH EVERY 6 HOURS AS NEEDED FOR UPTO 10 DAYS FOR CRAMPING 360 tablet 0   isosorbide mononitrate (IMDUR) 30 MG 24 hr tablet TAKE 1 TABLET BY MOUTH EVERY DAY 90 tablet 3   metoprolol succinate (TOPROL-XL) 50 MG 24 hr tablet TAKE 1 AND 1/2 TABLETS BY MOUTH TWICE DAILY 270 tablet 3   pantoprazole (PROTONIX) 40  MG tablet TAKE 1 TABLET BY MOUTH EVERY DAY 90 tablet 1   simvastatin (ZOCOR) 20 MG tablet Take 1 tablet (20 mg total) by mouth at bedtime. 90 tablet 3   warfarin (COUMADIN) 5 MG tablet TAKE 1/2 TO 1 TABLET BY MOUTH DAILY AS DIRECTED BY COUMADIN CLINIC 90 tablet 1   No facility-administered medications prior to visit.    ROS: Review of Systems  Constitutional:  Positive for fatigue. Negative for activity change, appetite change, chills and unexpected weight change.  HENT:  Negative for congestion, mouth sores and sinus pressure.   Eyes:  Negative for visual disturbance.  Respiratory:  Negative for cough and chest tightness.   Gastrointestinal:  Negative for abdominal pain and nausea.  Genitourinary:  Negative for difficulty urinating, frequency and vaginal pain.  Musculoskeletal:  Positive for arthralgias and back pain. Negative for gait problem.  Skin:  Negative for pallor and rash.  Neurological:  Negative for dizziness, tremors, weakness, numbness and headaches.  Psychiatric/Behavioral:  Negative for confusion and sleep disturbance.     Objective:  BP 118/70 (BP Location: Right Arm, Patient Position: Sitting, Cuff Size: Large)   Pulse 62   Temp 98.3 F (36.8 C) (Oral)   Ht 5'  6" (1.676 m)   Wt 188 lb (85.3 kg)   SpO2 94%   BMI 30.34 kg/m   BP Readings from Last 3 Encounters:  12/23/22 118/70  11/10/22 118/78  10/22/22 110/64    Wt Readings from Last 3 Encounters:  12/23/22 188 lb (85.3 kg)  11/10/22 188 lb (85.3 kg)  10/22/22 179 lb 12.8 oz (81.6 kg)    Physical Exam Constitutional:      General: She is not in acute distress.    Appearance: She is well-developed.  HENT:     Head: Normocephalic.     Right Ear: External ear normal.     Left Ear: External ear normal.     Nose: Nose normal.  Eyes:     General:        Right eye: No discharge.        Left eye: No discharge.     Conjunctiva/sclera: Conjunctivae normal.     Pupils: Pupils are equal, round, and  reactive to light.  Neck:     Thyroid: No thyromegaly.     Vascular: No JVD.     Trachea: No tracheal deviation.  Cardiovascular:     Rate and Rhythm: Bradycardia present. Rhythm irregular.     Heart sounds: Normal heart sounds.  Pulmonary:     Effort: No respiratory distress.     Breath sounds: No stridor. No wheezing.  Abdominal:     General: Bowel sounds are normal. There is no distension.     Palpations: Abdomen is soft. There is no mass.     Tenderness: There is no abdominal tenderness. There is no guarding or rebound.  Musculoskeletal:        General: No tenderness.     Cervical back: Normal range of motion and neck supple. No rigidity.  Lymphadenopathy:     Cervical: No cervical adenopathy.  Skin:    Findings: No erythema or rash.  Neurological:     Cranial Nerves: No cranial nerve deficit.     Motor: No abnormal muscle tone.     Coordination: Coordination normal.     Deep Tendon Reflexes: Reflexes normal.  Psychiatric:        Behavior: Behavior normal.        Thought Content: Thought content normal.        Judgment: Judgment normal.   Walking slow  Lab Results  Component Value Date   WBC 9.5 10/19/2022   HGB 12.7 10/19/2022   HCT 39.2 10/19/2022   PLT 260 10/19/2022   GLUCOSE 175 (H) 11/27/2022   CHOL 163 11/27/2022   TRIG 149.0 11/27/2022   HDL 57.30 11/27/2022   LDLDIRECT 105.5 11/26/2010   LDLCALC 76 11/27/2022   ALT 12 11/27/2022   AST 18 11/27/2022   NA 131 (L) 11/27/2022   K 3.8 11/27/2022   CL 98 11/27/2022   CREATININE 1.11 11/27/2022   BUN 17 11/27/2022   CO2 28 11/27/2022   TSH 1.660 10/22/2022   INR 2.9 12/18/2022   HGBA1C 7.2 (H) 09/16/2022    No results found.  Assessment & Plan:   Problem List Items Addressed This Visit     Essential hypertension - Primary    Chronic Cont on Metoprolol, Norvasc      Relevant Orders   Comprehensive metabolic panel   Hemoglobin A1c   Osteoarthritis    Better Not using a cane lately       DM2 (diabetes mellitus, type 2) (HCC)    Better  CBG 114-154  at home      Relevant Orders   Comprehensive metabolic panel   Hemoglobin A1c   Long term (current) use of anticoagulants    Bleeding risks discussed      PAF (paroxysmal atrial fibrillation) (HCC)    On Coumadin, Toprol XL CHA2DS2VASc = 4-->coumadin.  Potential benefits of a long term Coumadin use as well as potential risks  and complications were explained to the patient and were aknowledged. Pt agreed to get Prevnar 20      Relevant Orders   Comprehensive metabolic panel   Hemoglobin A1c      No orders of the defined types were placed in this encounter.     Follow-up: Return in about 3 months (around 03/24/2023) for a follow-up visit.  Sonda Primes, MD

## 2022-12-23 NOTE — Assessment & Plan Note (Signed)
Bleeding risks discussed 

## 2022-12-26 ENCOUNTER — Other Ambulatory Visit: Payer: Self-pay | Admitting: Internal Medicine

## 2022-12-27 ENCOUNTER — Other Ambulatory Visit: Payer: Self-pay | Admitting: Physician Assistant

## 2022-12-27 DIAGNOSIS — E785 Hyperlipidemia, unspecified: Secondary | ICD-10-CM

## 2022-12-29 ENCOUNTER — Telehealth: Payer: Self-pay

## 2022-12-29 NOTE — Patient Outreach (Signed)
Care Coordination   12/29/2022 Name: [name] MRN: [id number] DOB: [birth year]   Care Coordination Outreach Attempts:  An unsuccessful telephone outreach was attempted for a scheduled appointment today.  Follow Up Plan:  Additional outreach attempts will be made to offer the patient care coordination information and services.   Encounter Outcome:  No Answer   Care Coordination Interventions:  No, not indicated    Kathyrn Sheriff, RN, MSN, BSN, CCM Care Management Coordinator [phone]

## 2022-12-30 ENCOUNTER — Other Ambulatory Visit: Payer: Self-pay | Admitting: Cardiology

## 2023-01-07 ENCOUNTER — Telehealth: Payer: Self-pay | Admitting: *Deleted

## 2023-01-07 NOTE — Progress Notes (Signed)
Care Coordination Note  01/07/2023 Name: OASIS FREDRICH MRN: [id number] DOB: 25-Oct-1934  ANAM HANIF is a 88 y.o. year old female who is a primary care patient of Plotnikov, Georgina Quint, MD and is actively engaged with the care management team. I reached out to [name] by phone today to assist with re-scheduling a follow up visit with the RN Case Manager  Follow up plan: Unsuccessful telephone outreach attempt made. A HIPAA compliant phone message was left for the patient providing contact information and requesting a return call.   Burman Nieves, CCMA Care Coordination Care Guide Direct Dial: [phone]

## 2023-01-14 ENCOUNTER — Encounter (HOSPITAL_COMMUNITY): Payer: Self-pay

## 2023-01-14 ENCOUNTER — Emergency Department (HOSPITAL_COMMUNITY)
Admission: EM | Admit: 2023-01-14 | Discharge: 2023-01-15 | Disposition: A | Discharge status: Home / Self Care | Payer: Medicare HMO | Attending: Emergency Medicine | Admitting: Emergency Medicine

## 2023-01-14 ENCOUNTER — Other Ambulatory Visit: Payer: Self-pay

## 2023-01-14 DIAGNOSIS — R002 Palpitations: Secondary | ICD-10-CM | POA: Insufficient documentation

## 2023-01-14 DIAGNOSIS — Z7982 Long term (current) use of aspirin: Secondary | ICD-10-CM | POA: Diagnosis not present

## 2023-01-14 LAB — COMPREHENSIVE METABOLIC PANEL
ALT: 17 U/L (ref 0–44)
AST: 20 U/L (ref 15–41)
Albumin: 4.4 g/dL (ref 3.5–5.0)
Alkaline Phosphatase: 56 U/L (ref 38–126)
Anion gap: 8 (ref 5–15)
BUN: 14 mg/dL (ref 8–23)
CO2: 25 mmol/L (ref 22–32)
Calcium: 8.9 mg/dL (ref 8.9–10.3)
Chloride: 102 mmol/L (ref 98–111)
Creatinine, Ser: 1.11 mg/dL — ABNORMAL HIGH (ref 0.44–1.00)
GFR, Estimated: 48 mL/min — ABNORMAL LOW (ref >60)
Glucose, Bld: 186 mg/dL — ABNORMAL HIGH (ref 70–99)
Potassium: 3.7 mmol/L (ref 3.5–5.1)
Sodium: 135 mmol/L (ref 135–145)
Total Bilirubin: 0.6 mg/dL (ref 0.3–1.2)
Total Protein: 7.4 g/dL (ref 6.5–8.1)

## 2023-01-14 LAB — CBC
HCT: 38.3 % (ref 36.0–46.0)
Hemoglobin: 12 g/dL (ref 12.0–15.0)
MCH: 29 pg (ref 26.0–34.0)
MCHC: 31.3 g/dL (ref 30.0–36.0)
MCV: 92.5 fL (ref 80.0–100.0)
Platelets: 200 10*3/uL (ref 150–400)
RBC: 4.14 MIL/uL (ref 3.87–5.11)
RDW: 13.2 % (ref 11.5–15.5)
WBC: 5.2 10*3/uL (ref 4.0–10.5)
nRBC: 0 % (ref 0.0–0.2)

## 2023-01-14 LAB — TROPONIN I (HIGH SENSITIVITY)
Troponin I (High Sensitivity): 4 ng/L (ref <18)
Troponin I (High Sensitivity): 5 ng/L (ref <18)

## 2023-01-14 NOTE — ED Notes (Signed)
Pt refused vitals at this time will try again later

## 2023-01-14 NOTE — ED Triage Notes (Addendum)
Patient reports palpitations starting last night. HR 67 on arrival. Denies chest pain, dizziness, and SOB. Patient also c/o right hand pain.

## 2023-01-14 NOTE — ED Provider Notes (Signed)
[place] EMERGENCY DEPARTMENT AT [facility] Provider Note   CSN: [id number] Arrival date & time: 01/14/23  1343     History  Chief Complaint  Patient presents with   Palpitations    [name] is a 88 y.o. female.  88 yo F with a chief complaint of palpitations.  This started last night and persisted throughout the evening.  She has had palpitations off and on and was told to take an extra dose of all of her medications.  She did and then ended up taking a Xanax and now feels a bit better.  She denies any chest pain or difficulty breathing.  Feels like he has been eating and drinking normally.  No nausea vomiting or diarrhea.  Denies any recent medication changes.  Denies any recent change to her diet.   Palpitations      Home Medications Prior to Admission medications   Medication Sig Start Date End Date Taking? Authorizing Provider  Accu-Chek Softclix Lancets lancets USE  AS  INSTRUCTED 12/09/22   Plotnikov, Georgina Quint, MD  acetaminophen (TYLENOL) 500 MG tablet Take 500 mg by mouth every 6 (six) hours as needed.    [provider]  Alcohol Swabs (DROPSAFE ALCOHOL PREP) 70 % PADS USE TO CLEAN INJECTION SITE TO CHECK BLOOD SUGARS DAILY 11/10/22   Plotnikov, Georgina Quint, MD  ALPRAZolam (XANAX) 0.5 MG tablet TAKE 1/2 TO 1 TABLET BY MOUTH THREE TIMES DAILY AS NEEDED FOR ANXIETY.  DO NOT TAKE WITH TRAMADOL. THIS IS A 30 DAY SUPPLY 09/16/22   Plotnikov, Georgina Quint, MD  amLODipine (NORVASC) 5 MG tablet TAKE 1 TABLET(5 MG) BY MOUTH DAILY 06/30/22   Plotnikov, Georgina Quint, MD  aspirin 81 MG tablet Take 81 mg by mouth daily.    [provider]  Blood Glucose Monitoring Suppl (ACCU-CHEK AVIVA PLUS) w/Device KIT Use to check blood sugar twice a day 12/02/22   Plotnikov, Georgina Quint, MD  Cholecalciferol (VITAMIN D) 50 MCG (2000 UT) CAPS TAKE 1 CAPSULE BY MOUTH EVERY DAY 12/26/22   Plotnikov, Georgina Quint, MD  diclofenac Sodium (VOLTAREN) 1 % GEL Apply 2 g topically 4  (four) times daily. 11/01/19   Cristie Hem, PA-C  ezetimibe (ZETIA) 10 MG tablet Take 10 mg by mouth daily. 09/29/22   [provider]  furosemide (LASIX) 20 MG tablet TAKE 1 TABLET(20 MG) BY MOUTH DAILY AS NEEDED FOR SWELLING 12/06/21   Hilty, [name], MD  glucose blood (ACCU-CHEK AVIVA PLUS) test strip Use as instructed 12/09/22   Plotnikov, Georgina Quint, MD  hydroxypropyl methylcellulose (ISOPTO TEARS) 2.5 % ophthalmic solution Place 1 drop into both eyes 2 (two) times daily.    [provider]  hyoscyamine (LEVSIN) 0.125 MG tablet TAKE 1 TABLET BY MOUTH EVERY 6 HOURS AS NEEDED FOR UPTO 10 DAYS FOR CRAMPING 04/24/22   Plotnikov, Georgina Quint, MD  isosorbide mononitrate (IMDUR) 30 MG 24 hr tablet TAKE 1 TABLET BY MOUTH EVERY DAY 12/30/22   Rollene Rotunda, MD  metoprolol succinate (TOPROL-XL) 50 MG 24 hr tablet TAKE 1 AND 1/2 TABLETS BY MOUTH TWICE DAILY 06/19/22   Hilty, [name], MD  pantoprazole (PROTONIX) 40 MG tablet TAKE 1 TABLET BY MOUTH EVERY DAY 12/08/22   Hilty, [name], MD  simvastatin (ZOCOR) 20 MG tablet Take 1 tablet (20 mg total) by mouth at bedtime. 10/22/22   Joylene Grapes, NP  warfarin (COUMADIN) 5 MG tablet TAKE 1/2 TO 1 TABLET BY MOUTH DAILY AS  DIRECTED BY COUMADIN CLINIC 09/09/22   Hilty, [name], MD      Allergies    Acetaminophen-codeine, Clarithromycin, Fosamax [alendronate sodium], Guaifenesin er, Metformin, Prandin [repaglinide], and Tape    Review of Systems   Review of Systems  Cardiovascular:  Positive for palpitations.    Physical Exam Updated Vital Signs BP 110/63   Pulse (!) 101   Temp (!) 97.5 F (36.4 C) (Oral)   Resp 16   Ht 5\' 6"  (1.676 m)   Wt 85.7 kg   SpO2 98%   BMI 30.51 kg/m  Physical Exam Vitals and nursing note reviewed.  Constitutional:      General: She is not in acute distress.    Appearance: She is well-developed. She is not diaphoretic.  HENT:     Head: Normocephalic and atraumatic.  Eyes:     Pupils: Pupils are  equal, round, and reactive to light.  Cardiovascular:     Rate and Rhythm: Normal rate and regular rhythm.     Heart sounds: No murmur heard.    No friction rub. No gallop.  Pulmonary:     Effort: Pulmonary effort is normal.     Breath sounds: No wheezing or rales.  Abdominal:     General: There is no distension.     Palpations: Abdomen is soft.     Tenderness: There is no abdominal tenderness.  Musculoskeletal:        General: No tenderness.     Cervical back: Normal range of motion and neck supple.  Skin:    General: Skin is warm and dry.  Neurological:     Mental Status: She is alert and oriented to person, place, and time.  Psychiatric:        Behavior: Behavior normal.     ED Results / Procedures / Treatments   Labs (all labs ordered are listed, but only abnormal results are displayed) Labs Reviewed  COMPREHENSIVE METABOLIC PANEL - Abnormal; Notable for the following components:      Result Value   Glucose, Bld 186 (*)    Creatinine, Ser 1.11 (*)    GFR, Estimated 48 (*)    All other components within normal limits  CBC  TROPONIN I (HIGH SENSITIVITY)  TROPONIN I (HIGH SENSITIVITY)    EKG EKG Interpretation Date/Time:  Wednesday January 14 2023 13:50:30 EDT Ventricular Rate:  80 PR Interval:  217 QRS Duration:  140 QT Interval:  454 QTC Calculation: 446 R Axis:   -39  Text Interpretation: Sinus rhythm Atrial premature complexes in couplets Borderline prolonged PR interval Left bundle branch block No significant change since last tracing Confirmed by Melene Plan [phone]) on 01/14/2023 11:04:55 PM  Radiology No results found.  Procedures .1-3 Lead EKG Interpretation  Performed by: Melene Plan, DO Authorized by: Melene Plan, DO     Interpretation: normal     ECG rate:  63   ECG rate assessment: normal     Rhythm: sinus rhythm     Ectopy: none     Conduction: normal       Medications Ordered in ED Medications - No data to display  ED Course/ Medical  Decision Making/ A&P                                 Medical Decision Making Amount and/or Complexity of Data Reviewed Labs: ordered.   88 yo F with a chief complaint of palpitations.  These lasted overnight last night and now have resolved she thinks.  She has a history of palpitations.  I reviewed her medical record she has seen cardiology in the office was having paroxysmal atrial fibrillation on 1 cardiac monitor and had paroxysmal SVT on a different cardiac monitor.  She is in normal sinus rhythm on my exam.  Lab work without significant electrolyte abnormality, no anemia, troponin negative x 2.  Will discharge home.  Cardiology follow-up.   11:25 PM:  I have discussed the diagnosis/risks/treatment options with the patient.  Evaluation and diagnostic testing in the emergency department does not suggest an emergent condition requiring admission or immediate intervention beyond what has been performed at this time.  They will follow up with PCP, Cards. We also discussed returning to the ED immediately if new or worsening sx occur. We discussed the sx which are most concerning (e.g., sudden worsening pain, fever, inability to tolerate by mouth) that necessitate immediate return. Medications administered to the patient during their visit and any new prescriptions provided to the patient are listed below.  Medications given during this visit Medications - No data to display   The patient appears reasonably screen and/or stabilized for discharge and I doubt any other medical condition or other [facility] requiring further screening, evaluation, or treatment in the ED at this time prior to discharge.          Final Clinical Impression(s) / ED Diagnoses Final diagnoses:  Palpitations    Rx / DC Orders ED Discharge Orders          Ordered    Ambulatory referral to Cardiology       Comments: If you have not heard from the Cardiology office within the next 72 hours please call [phone].    01/14/23 2322              Melene Plan, DO 01/14/23 2325

## 2023-01-14 NOTE — Discharge Instructions (Signed)
Follow up with your cardiologist.  Return for worsening or persistent symptoms.

## 2023-01-15 ENCOUNTER — Telehealth: Payer: Self-pay | Admitting: Internal Medicine

## 2023-01-15 ENCOUNTER — Ambulatory Visit: Payer: Medicare HMO | Admitting: Internal Medicine

## 2023-01-15 NOTE — Telephone Encounter (Signed)
Called pt concerning knot on her hand. Pt states she went to ED yesterday and showed them the knot. Pt states she was instructed to monitor closely and go back to the emergency room if the knot worsens. I reiterated the importance of monitoring and seeking medical attention if are worsens.

## 2023-01-15 NOTE — Telephone Encounter (Signed)
Pt c/o medication issue:  1. Name of Medication:  warfarin (COUMADIN) 5 MG tablet  2. How are you currently taking this medication (dosage and times per day)?   3. Are you having a reaction (difficulty breathing--STAT)?   4. What is your medication issue?   Patient states she has a knot on her hand and she has concerns with it being a blood clot because she is on Coumadin.

## 2023-01-20 ENCOUNTER — Ambulatory Visit: Payer: Medicare HMO | Admitting: Internal Medicine

## 2023-01-20 NOTE — Progress Notes (Signed)
Care Coordination Note  01/20/2023 Name: [name] MRN: [id number] DOB: 01/19/1935  [name] is a 88 y.o. year old female who is a primary care patient of Plotnikov, Georgina Quint, MD and is actively engaged with the care management team. I reached out to [name] by phone today to assist with re-scheduling a follow up visit with the RN Case Manager  Follow up plan: Telephone appointment with care management team member scheduled for:02/03/2023  Burman Nieves, [facility] Care Coordination Care Guide Direct Dial: [phone]

## 2023-01-27 ENCOUNTER — Encounter: Payer: Self-pay | Admitting: Nurse Practitioner

## 2023-01-27 ENCOUNTER — Ambulatory Visit (INDEPENDENT_AMBULATORY_CARE_PROVIDER_SITE_OTHER): Payer: Medicare HMO

## 2023-01-27 ENCOUNTER — Ambulatory Visit: Payer: Medicare HMO | Admitting: Nurse Practitioner

## 2023-01-27 ENCOUNTER — Ambulatory Visit: Payer: Medicare HMO | Attending: Nurse Practitioner | Admitting: Nurse Practitioner

## 2023-01-27 ENCOUNTER — Ambulatory Visit (INDEPENDENT_AMBULATORY_CARE_PROVIDER_SITE_OTHER): Payer: Medicare HMO | Admitting: *Deleted

## 2023-01-27 VITALS — BP 130/66 | HR 62 | Ht 66.0 in | Wt 186.4 lb

## 2023-01-27 DIAGNOSIS — E785 Hyperlipidemia, unspecified: Secondary | ICD-10-CM | POA: Diagnosis not present

## 2023-01-27 DIAGNOSIS — E119 Type 2 diabetes mellitus without complications: Secondary | ICD-10-CM | POA: Diagnosis not present

## 2023-01-27 DIAGNOSIS — I447 Left bundle-branch block, unspecified: Secondary | ICD-10-CM | POA: Diagnosis not present

## 2023-01-27 DIAGNOSIS — I471 Supraventricular tachycardia, unspecified: Secondary | ICD-10-CM

## 2023-01-27 DIAGNOSIS — I10 Essential (primary) hypertension: Secondary | ICD-10-CM | POA: Diagnosis not present

## 2023-01-27 DIAGNOSIS — I480 Paroxysmal atrial fibrillation: Secondary | ICD-10-CM

## 2023-01-27 DIAGNOSIS — Z7901 Long term (current) use of anticoagulants: Secondary | ICD-10-CM | POA: Diagnosis not present

## 2023-01-27 DIAGNOSIS — R002 Palpitations: Secondary | ICD-10-CM | POA: Diagnosis not present

## 2023-01-27 DIAGNOSIS — I2510 Atherosclerotic heart disease of native coronary artery without angina pectoris: Secondary | ICD-10-CM | POA: Diagnosis not present

## 2023-01-27 LAB — POCT INR: INR: 2.4 (ref 2.0–3.0)

## 2023-01-27 NOTE — Progress Notes (Unsigned)
Enrolled patient for a 14 day Zio XT monitor to be mailed to patients home  Hilty to read

## 2023-01-27 NOTE — Patient Instructions (Addendum)
Medication Instructions:  Your physician recommends that you continue on your current medications as directed. Please refer to the Current Medication list given to you today.  *If you need a refill on your cardiac medications before your next appointment, please call your pharmacy*   Lab Work: NONE ordered at this time of appointment    Testing/Procedures: ZIO AT Long term monitor-non live Telemetry  Your physician has requested you wear a ZIO patch monitor for 14 days.  This is a single patch monitor. [employer] supplies one patch monitor per enrollment. Additional  stickers are not available.  Please do not apply patch if you will be having a Nuclear Stress Test, Echocardiogram, Cardiac CT, MRI,  or Chest Xray during the period you would be wearing the monitor. The patch cannot be worn during  these tests. You cannot remove and re-apply the ZIO AT patch monitor.  Your ZIO patch monitor will be mailed 3 day USPS to your address on file. It may take 3-5 days to  receive your monitor after you have been enrolled.  Once you have received your monitor, please review the enclosed instructions. Your monitor has  already been registered assigning a specific monitor serial # to you.   Billing and Patient Assistance Program information  Meredeth Ide has been supplied with any insurance information on record for billing. [employer] offers a sliding scale Patient Assistance Program for patients without insurance, or whose  insurance does not completely cover the cost of the ZIO patch monitor. You must apply for the  Patient Assistance Program to qualify for the discounted rate. To apply, call [employer] at [phone],  select option 4, select option 2 , ask to apply for the Patient Assistance Program, (you can request an  interpreter if needed). [employer] will ask your household income and how many people are in your  household. [employer] will quote your out-of-pocket cost based on this information. They will  also be able  to set up a 12 month interest free payment plan if needed.  Applying the monitor   Shave hair from upper left chest.  Hold the abrader disc by orange tab. Rub the abrader in 40 strokes over left upper chest as indicated in  your monitor instructions.  Clean area with 4 enclosed alcohol pads. Use all pads to ensure the area is cleaned thoroughly. Let  dry.  Apply patch as indicated in monitor instructions. Patch will be placed under collarbone on left side of  chest with arrow pointing upward.  Rub patch adhesive wings for 2 minutes. Remove the white label marked "1". Remove the white label  marked "2". Rub patch adhesive wings for 2 additional minutes.  While looking in a mirror, press and release button in center of patch. A small green light will flash 3-4  times. This will be your only indicator that the monitor has been turned on.  Do not shower for the first 24 hours. You may shower after the first 24 hours.  Press the button if you feel a symptom. You will hear a small click. Record Date, Time and Symptom in  the Patient Log.   Starting the Gateway  In your kit there is a Audiological scientist box the size of a cellphone. This is Buyer, retail. It transmits all your  recorded data to [facility]. This box must always stay within 10 feet of you. Open the box and push the *  button. There will be a light that blinks orange and then green a few  times. When the light stops  blinking, the Gateway is connected to the ZIO patch. Call [employer] at [phone] to confirm your monitor is transmitting.  Returning your monitor  Remove your patch and place it inside the Gateway. In the lower half of the Gateway there is a white  bag with prepaid postage on it. Place Gateway in bag and seal. Mail package back to [place] as soon as  possible. Your physician should have your final report approximately 7 days after you have mailed back  your monitor. Call [facility] Customer Care at  [phone] if you have questions regarding your ZIO AT  patch monitor. Call them immediately if you see an orange light blinking on your monitor.  If your monitor falls off in less than 4 days, contact our Monitor department at [phone]. If your  monitor becomes loose or falls off after 4 days call [employer] at [phone] for suggestions on  securing your monitor    Follow-Up: At [facility], you and your health needs are our priority.  As part of our continuing mission to provide you with exceptional heart care, we have created designated Provider Care Teams.  These Care Teams include your primary Cardiologist (physician) and Advanced Practice Providers (APPs -  Physician Assistants and Nurse Practitioners) who all work together to provide you with the care you need, when you need it.  We recommend signing up for the patient portal called "MyChart".  Sign up information is provided on this After Visit Summary.  MyChart is used to connect with patients for Virtual Visits (Telemedicine).  Patients are able to view lab/test results, encounter notes, upcoming appointments, etc.  Non-urgent messages can be sent to your provider as well.   To learn more about what you can do with MyChart, go to [email or web address].    Your next appointment:   3 month(s)  Provider:   Bernadene Person, NP

## 2023-01-27 NOTE — Progress Notes (Signed)
Office Visit    Patient Name: [name] Date of Encounter: 01/27/2023  Primary Care Provider:  Tresa Garter, MD Primary Cardiologist:  Chrystie Nose, MD  Chief Complaint    88 year old female with a history of nonobstructive CAD, LBBB, paroxysmal atrial fibrillation, PSVT, hypertension, hyperlipidemia, type 2 diabetes, and chronic venous insufficiency who presents follow-up related to palpitations.   Past Medical History    Past Medical History:  Diagnosis Date   Anxiety state, unspecified    Bronchitis, not specified as acute or chronic    Diaphragmatic hernia without mention of obstruction or gangrene    Dyslipidemia    Essential hypertension    Irritable bowel syndrome    Mitral valve disorders(424.0)    a. 08/2010 Echo: EF >55%, mild MR, mod TR, trace AI.   Non-obstructive CAD    a. 07/2010 Lexiscan MV: no ischemia, EF 78%; b. 07/2010 Cath: LM nl, LAD50/60p, 40 apical, LCX nl, RCA dominant, 30/40p, 4m.   Osteoarthrosis, unspecified whether generalized or localized, unspecified site    Osteoporosis, unspecified    Other abnormal glucose    PAF (paroxysmal atrial fibrillation) (HCC)    a. CHA2DS2VASc = 4-->coumadin.   Phlebitis and thrombophlebitis of superficial vessels of lower extremities    PSVT (paroxysmal supraventricular tachycardia) (HCC)    a. 05/2012 Holter: short bursts of PSVT noted-->Managed with toprol.   Unspecified venous (peripheral) insufficiency    Unspecified vitamin D deficiency    Past Surgical History:  Procedure Laterality Date   CARDIAC CATHETERIZATION  07/30/2010   nonobstructive CAD, 20-40% RCA stenosis, 50-60% eccentric LAD stenosis more prox, 40% LAD stenosis more distal, EF 65%   TRANSTHORACIC ECHOCARDIOGRAM  09/03/2010   EF=>55%; mild MR; mod TR; AV mildly sclerotic with trace regurg; mild pulm valve regurg   VESICOVAGINAL FISTULA CLOSURE W/ TAH      Allergies  Allergies  Allergen Reactions   Acetaminophen-Codeine      Made me depressed   Clarithromycin     REACTION: unspecified   Fosamax [Alendronate Sodium]     achy   Guaifenesin Er     itching   Metformin     REACTION: pt states INTOL to Metformin "felt drained"   Prandin [Repaglinide]     Prandin dropped CBGs to 66 once - she stopped it.   Tape     Adhesive tape---rash     Labs/Other Studies Reviewed    The following studies were reviewed today:  Cardiac Studies & Procedures     STRESS TESTS  NM MYOCAR MULTI W/SPECT W 07/22/2010  Narrative *RADIOLOGY REPORT*  Clinical Data:  Chest pain  MYOCARDIAL IMAGING WITH SPECT (REST AND PHARMACOLOGIC-STRESS) GATED LEFT VENTRICULAR WALL MOTION STUDY LEFT VENTRICULAR EJECTION FRACTION  Technique:  Standard myocardial SPECT imaging was performed after resting intravenous injection of 10 mCi Tc-59m tetrofosmin. Subsequently, intravenous infusion of Lexiscan was performed under the supervision of the Cardiology staff.  At peak effect of the drug, 30 mCi Tc-6m tetrofosmin was injected intravenously and standard myocardial SPECT  imaging was performed.  Quantitative gated imaging was also performed to evaluate left ventricular wall motion, and estimate left ventricular ejection fraction.  Comparison:  None.  Findings:  Spect:  No perfusion defects.  Wall motion:  Normal.  Ejection fraction:  78%.  IMPRESSION: No perfusion defects.  Original Report Authenticated By: Donavan Burnet, M.D.   ECHOCARDIOGRAM  ECHOCARDIOGRAM COMPLETE 03/13/2022  Narrative ECHOCARDIOGRAM REPORT    Patient Name:   [name]  Date of Exam: 03/13/2022 Medical Rec #:  [id number]        Height:       66.0 in Accession #:    [id number]       Weight:       189.0 lb Date of Birth:  12-19-1934        BSA:          1.952 m Patient Age:    87 years         BP:           128/78 mmHg Patient Gender: F                HR:           61 bpm. Exam Location:  [address]  Procedure: 2D Echo, Cardiac  Doppler, Color Doppler and 3D Echo  Indications:    Left Bundle Branch Block I44.7  History:        Patient has prior history of Echocardiogram examinations, most recent 05/19/2016. Arrythmias:Atrial Fibrillation; Risk Factors:Hypertension.  Sonographer:    Thurman Coyer RDCS Referring Phys: [id number] HAO MENG   Sonographer Comments: Global longitudinal strain was attempted. IMPRESSIONS   1. Left ventricular ejection fraction, by estimation, is 55 to 60%. Left ventricular ejection fraction by 3D volume is 57 %. The left ventricle has normal function. The left ventricle has no regional wall motion abnormalities. Left ventricular diastolic parameters are consistent with Grade I diastolic dysfunction (impaired relaxation). 2. Right ventricular systolic function is normal. The right ventricular size is normal. There is normal pulmonary artery systolic pressure. The estimated right ventricular systolic pressure is 20.3 mmHg. 3. The mitral valve is normal in structure. Trivial mitral valve regurgitation. No evidence of mitral stenosis. 4. The aortic valve is tricuspid. Aortic valve regurgitation is not visualized. Aortic valve sclerosis/calcification is present, without any evidence of aortic stenosis. 5. The inferior vena cava is normal in size with greater than 50% respiratory variability, suggesting right atrial pressure of 3 mmHg.  FINDINGS Left Ventricle: Left ventricular ejection fraction, by estimation, is 55 to 60%. Left ventricular ejection fraction by 3D volume is 57 %. The left ventricle has normal function. The left ventricle has no regional wall motion abnormalities. The left ventricular internal cavity size was normal in size. There is no left ventricular hypertrophy. Left ventricular diastolic parameters are consistent with Grade I diastolic dysfunction (impaired relaxation). Normal left ventricular filling pressure.  Right Ventricle: The right ventricular size is normal. No  increase in right ventricular wall thickness. Right ventricular systolic function is normal. There is normal pulmonary artery systolic pressure. The tricuspid regurgitant velocity is 2.08 m/s, and with an assumed right atrial pressure of 3 mmHg, the estimated right ventricular systolic pressure is 20.3 mmHg.  Left Atrium: Left atrial size was normal in size.  Right Atrium: Right atrial size was normal in size.  Pericardium: There is no evidence of pericardial effusion.  Mitral Valve: The mitral valve is normal in structure. Trivial mitral valve regurgitation. No evidence of mitral valve stenosis.  Tricuspid Valve: The tricuspid valve is normal in structure. Tricuspid valve regurgitation is not demonstrated. No evidence of tricuspid stenosis.  Aortic Valve: The aortic valve is tricuspid. Aortic valve regurgitation is not visualized. Aortic valve sclerosis/calcification is present, without any evidence of aortic stenosis.  Pulmonic Valve: The pulmonic valve was normal in structure. Pulmonic valve regurgitation is trivial. No evidence of pulmonic stenosis.  Aorta: The aortic root is normal in size and structure.  Venous: The inferior vena cava is normal in size with greater than 50% respiratory variability, suggesting right atrial pressure of 3 mmHg.  IAS/Shunts: No atrial level shunt detected by color flow Doppler.   LEFT VENTRICLE PLAX 2D LVIDd:         4.40 cm         Diastology LVIDs:         2.90 cm         LV e' medial:    4.73 cm/s LV PW:         1.00 cm         LV E/e' medial:  10.5 LV IVS:        1.00 cm         LV e' lateral:   6.22 cm/s LVOT diam:     2.00 cm         LV E/e' lateral: 8.0 LV SV:         68 LV SV Index:   35 LVOT Area:     3.14 cm        3D Volume EF LV 3D EF:    Left ventricul ar ejection fraction by 3D volume is 57 %.  3D Volume EF: 3D EF:        57 % LV EDV:       87 ml LV ESV:       37 ml LV SV:        49 ml  RIGHT VENTRICLE RV Basal diam:   3.60 cm RV Mid diam:    3.10 cm RV S prime:     9.55 cm/s TAPSE (M-mode): 2.3 cm  LEFT ATRIUM             Index        RIGHT ATRIUM           Index LA diam:        3.30 cm 1.69 cm/m   RA Area:     14.20 cm LA Vol (A2C):   57.8 ml 29.61 ml/m  RA Volume:   34.60 ml  17.72 ml/m LA Vol (A4C):   46.8 ml 23.95 ml/m LA Biplane Vol: 57.2 ml 29.30 ml/m AORTIC VALVE LVOT Vmax:   90.00 cm/s LVOT Vmean:  59.600 cm/s LVOT VTI:    0.217 m  AORTA Ao Root diam: 2.80 cm Ao Asc diam:  3.40 cm  MITRAL VALVE               TRICUSPID VALVE MV Area (PHT): 2.66 cm    TR Peak grad:   17.3 mmHg MV Decel Time: 285 msec    TR Vmax:        208.00 cm/s MV E velocity: 49.80 cm/s MV A velocity: 77.70 cm/s  SHUNTS MV E/A ratio:  0.64        Systemic VTI:  0.22 m Systemic Diam: 2.00 cm  Armanda Magic MD Electronically signed by Armanda Magic MD Signature Date/Time: 03/13/2022/11:20:36 AM    Final    MONITORS  LONG TERM MONITOR (3-14 DAYS) 06/15/2020  Narrative Multiple episodes of what appear to be an SVT.  Chrystie Nose, MD, [facility], FACP [place]  [facility] Medical Director of the [facility] Diplomate of the American Board of Clinical Lipidology Attending Cardiologist Direct Dial: [phone]  Fax: [phone] Website:  [email or web address]          Recent Labs: 10/22/2022: Magnesium 2.0; TSH 1.660 01/14/2023: ALT  17; BUN 14; Creatinine, Ser 1.11; Hemoglobin 12.0; Platelets 200; Potassium 3.7; Sodium 135  Recent Lipid Panel    Component Value Date/Time   CHOL 163 11/27/2022 1544   CHOL 161 09/26/2022 0931   TRIG 149.0 11/27/2022 1544   HDL 57.30 11/27/2022 1544   HDL 68 09/26/2022 0931   CHOLHDL 3 11/27/2022 1544   VLDL 29.8 11/27/2022 1544   LDLCALC 76 11/27/2022 1544   LDLCALC 75 09/26/2022 0931   LDLDIRECT 105.5 11/26/2010 0905    History of Present Illness    88 year old female with the above past medical  history including nonobstructive CAD, LBBB, paroxysmal atrial fibrillation, PSVT, hypertension, hyperlipidemia, type 2 diabetes, and chronic venous insufficiency.   Myoview in the setting of chest pain in April 2012 showed EF 78%, no evidence of ischemia.  Due to persistent symptoms she eventually underwent cardiac catheterization in April 2012 that showed normal left main, 50 to 60% LAD stenosis, 40% apical lesion, normal left circumflex, 30 to 40% proximal RCA stenosis.  Echocardiogram at that time showed EF greater than 55%, mild aortic insufficiency, aneurysmal atrial septum. Cardiac monitor in 2020 showed paroxysmal atrial fibrillation, low burden.  Repeat cardiac monitor in 06/2020 in the setting of palpitations despite titration of beta-blocker therapy revealed PSVT.  Additionally, she has a history of LBBB.  Echocardiogram in 02/2022 showed EF 55 to 60%, normal LV function, no RWMA, normal RV, no significant valvular abnormalities.  She was evaluated in the ED on 10/11/2022 and 10/19/2022 in the setting of palpitations.  She was last seen in the office on 10/22/2022 and was stable from a cardiac standpoint. She was seen in the ED on 01/14/2023 in the setting of palpitations.  EKG showed sinus rhythm.  Labs were unremarkable, troponin was negative x 2.  She was discharged home in stable condition and advised to follow-up with cardiology as an outpatient.  She presents today for follow-up accompanied by her niece. Since her last visit and recent ED visit she has been able from a cardiac standpoint.  She continues to note intermittent palpitations, her symptoms have improved with an extra dose of metoprolol.  She has not had symptoms lasting more than an hour at a time.  She has mild chest discomfort and shortness of breath only in the setting of palpitations.  Denies any other chest pain or shortness of breath.  Overall, her symptoms have been stable.   Home Medications    Current Outpatient Medications   Medication Sig Dispense Refill   Accu-Chek Softclix Lancets lancets USE  AS  INSTRUCTED 100 each 3   Alcohol Swabs (DROPSAFE ALCOHOL PREP) 70 % PADS USE TO CLEAN INJECTION SITE TO CHECK BLOOD SUGARS DAILY 100 each 3   ALPRAZolam (XANAX) 0.5 MG tablet TAKE 1/2 TO 1 TABLET BY MOUTH THREE TIMES DAILY AS NEEDED FOR ANXIETY.  DO NOT TAKE WITH TRAMADOL. THIS IS A 30 DAY SUPPLY 90 tablet 3   amLODipine (NORVASC) 5 MG tablet TAKE 1 TABLET(5 MG) BY MOUTH DAILY 90 tablet 3   aspirin 81 MG tablet Take 81 mg by mouth daily.     Blood Glucose Monitoring Suppl (ACCU-CHEK AVIVA PLUS) w/Device KIT Use to check blood sugar twice a day 1 kit 0   Cholecalciferol (VITAMIN D) 50 MCG (2000 UT) CAPS TAKE 1 CAPSULE BY MOUTH EVERY DAY 100 capsule 3   furosemide (LASIX) 20 MG tablet TAKE 1 TABLET(20 MG) BY MOUTH DAILY AS NEEDED FOR SWELLING 90 tablet 3   glucose blood (  ACCU-CHEK AVIVA PLUS) test strip Use as instructed 100 strip 3   hydroxypropyl methylcellulose (ISOPTO TEARS) 2.5 % ophthalmic solution Place 1 drop into both eyes 2 (two) times daily.     metoprolol succinate (TOPROL-XL) 50 MG 24 hr tablet TAKE 1 AND 1/2 TABLETS BY MOUTH TWICE DAILY 270 tablet 3   pantoprazole (PROTONIX) 40 MG tablet TAKE 1 TABLET BY MOUTH EVERY DAY 90 tablet 1   simvastatin (ZOCOR) 20 MG tablet Take 1 tablet (20 mg total) by mouth at bedtime. 90 tablet 3   warfarin (COUMADIN) 5 MG tablet TAKE 1/2 TO 1 TABLET BY MOUTH DAILY AS DIRECTED BY COUMADIN CLINIC 90 tablet 1   acetaminophen (TYLENOL) 500 MG tablet Take 500 mg by mouth every 6 (six) hours as needed. (Patient not taking: Reported on 01/27/2023)     diclofenac Sodium (VOLTAREN) 1 % GEL Apply 2 g topically 4 (four) times daily. (Patient not taking: Reported on 01/27/2023) 150 g 1   ezetimibe (ZETIA) 10 MG tablet Take 10 mg by mouth daily. (Patient not taking: Reported on 01/27/2023)     hyoscyamine (LEVSIN) 0.125 MG tablet TAKE 1 TABLET BY MOUTH EVERY 6 HOURS AS NEEDED FOR UPTO 10 DAYS  FOR CRAMPING (Patient not taking: Reported on 01/27/2023) 360 tablet 0   isosorbide mononitrate (IMDUR) 30 MG 24 hr tablet TAKE 1 TABLET BY MOUTH EVERY DAY 90 tablet 2   No current facility-administered medications for this visit.     Review of Systems    She denies chest pain, dyspnea, pnd, orthopnea, n, v, dizziness, syncope, edema, weight gain, or early satiety. All other systems reviewed and are otherwise negative except as noted above.   Physical Exam    VS:  BP 130/66 (BP Location: Left Arm, Patient Position: Sitting, Cuff Size: Large)   Pulse 62   Ht 5\' 6"  (1.676 m)   Wt 186 lb 6.4 oz (84.6 kg)   SpO2 97%   BMI 30.09 kg/m  GEN: Well nourished, well developed, in no acute distress. HEENT: normal. Neck: Supple, no JVD, carotid bruits, or masses. Cardiac: RRR, no murmurs, rubs, or gallops. No clubbing, cyanosis, edema.  Radials/DP/PT 2+ and equal bilaterally.  Respiratory:  Respirations regular and unlabored, clear to auscultation bilaterally. GI: Soft, nontender, nondistended, BS + x 4. MS: no deformity or atrophy. Skin: warm and dry, no rash. Neuro:  Strength and sensation are intact. Psych: Normal affect.  Accessory Clinical Findings    ECG personally reviewed by me today -    - no EKG in office today.   Lab Results  Component Value Date   WBC 5.2 01/14/2023   HGB 12.0 01/14/2023   HCT 38.3 01/14/2023   MCV 92.5 01/14/2023   PLT 200 01/14/2023   Lab Results  Component Value Date   CREATININE 1.11 (H) 01/14/2023   BUN 14 01/14/2023   NA 135 01/14/2023   K 3.7 01/14/2023   CL 102 01/14/2023   CO2 25 01/14/2023   Lab Results  Component Value Date   ALT 17 01/14/2023   AST 20 01/14/2023   ALKPHOS 56 01/14/2023   BILITOT 0.6 01/14/2023   Lab Results  Component Value Date   CHOL 163 11/27/2022   HDL 57.30 11/27/2022   LDLCALC 76 11/27/2022   LDLDIRECT 105.5 11/26/2010   TRIG 149.0 11/27/2022   CHOLHDL 3 11/27/2022    Lab Results  Component Value  Date   HGBA1C 7.2 (H) 09/16/2022    Assessment & Plan  1. Palpitations/PSVT: Cardiac monitor in 2020 showed paroxysmal atrial fibrillation, low burden. Repeat cardiac monitor in 06/2020 in the setting of palpitations despite titration of beta-blocker therapy revealed PSVT.  Echo in 02/2022 was stable. She continues to have intermittent palpitations, and several ED visits in this setting. Work-up has been reassuring each time.  Will check 14-day Zio. I advised her she may take an additional half tablet of metoprolol (no more than twice daily) as needed for palpitations. Reviewed ED precautions. Continue metoprolol.    2. Paroxysmal atrial fibrillation: Historically low burden. Ongoing intermittent palpitations. Repeat Zio monitor pending as above. Continue metoprolol, warfarin.    3. Nonobstructive CAD/LBBB: Cardiac catheterization in April 2012 showed normal left main, 50 to 60% LAD stenosis, 40% apical lesion, normal left circumflex, 30 to 40% proximal RCA stenosis. Echocardiogram in 02/2022 showed EF 55 to 60%, normal LV function, no RWMA, normal RV, no significant valvular abnormalities.  She has had mild chest discomfort in the setting of palpitations.  She denies any other chest pain, denies any exertional symptoms.  Continue to monitor symptoms.  Reviewed ED precautions.  Continue aspirin, amlodipine, metoprolol, Imdur, simvastatin, Zetia.   4. Hypertension: BP well controlled. Continue current antihypertensive regimen.    5. Hyperlipidemia: LDL was 76 in 11/2022.  She apparently did not tolerate a higher dose of simvastatin. Continue simvastatin, Zetia.     6. Type 2 diabetes: A1c was 7.2 in 09/2022. Monitored and managed per PCP.    7. Disposition: Follow-up in 6 to 8 weeks.      Joylene Grapes, NP 01/27/2023, 12:39 PM

## 2023-01-27 NOTE — Patient Instructions (Signed)
Description   Continue taking Warfarin 1 tablet daily. Repeat INR in 6 weeks.  Call [phone] with an warfarin related questions. *Consistent amount of greens every week*

## 2023-01-29 ENCOUNTER — Ambulatory Visit: Payer: Medicare HMO

## 2023-01-31 DIAGNOSIS — R002 Palpitations: Secondary | ICD-10-CM | POA: Diagnosis not present

## 2023-01-31 DIAGNOSIS — I480 Paroxysmal atrial fibrillation: Secondary | ICD-10-CM | POA: Diagnosis not present

## 2023-01-31 DIAGNOSIS — I471 Supraventricular tachycardia, unspecified: Secondary | ICD-10-CM

## 2023-02-03 ENCOUNTER — Ambulatory Visit: Payer: Self-pay

## 2023-02-03 NOTE — Patient Instructions (Signed)
Visit Information  Thank you for taking time to visit with me today. Please don't hesitate to contact me if I can be of assistance to you.   Following are the goals we discussed today:  Continue to take medications as prescribed. Continue to attend provider visits as scheduled Continue to eat healthy, lean meats, vegetables, fruits, avoid saturated and transfats Continue to check blood sugar as recommended and notify provider if questions or concerns Contact provider with any health questions/concerns as needed   Our next appointment is by telephone on 03/05/23 at 11:00 am  Please call the care guide team at [phone] if you need to cancel or reschedule your appointment.   If you are experiencing a Mental Health or Behavioral Health Crisis or need someone to talk to, please call the Suicide and Crisis Lifeline: 988 call the [place] National Suicide Prevention Lifeline: (351)308-5673 or TTY: 443-794-8769 TTY (732)172-4360) to talk to a trained counselor call 1-800-273-TALK (toll free, 24 hour hotline)  Kathyrn Sheriff, RN, MSN, BSN, CCM Care Management Coordinator [phone]

## 2023-02-03 NOTE — Patient Outreach (Signed)
Care Coordination   Follow Up Visit Note   02/03/2023 Name: DANYLAH FORRISTER MRN: [id number] DOB: 1935/01/11  NATALLIA KAPNER is a 88 y.o. year old female who sees Plotnikov, Georgina Quint, MD for primary care. I spoke with  [name] by phone today.  What matters to the patients health and wellness today?  Patient reports has obtained blood sugar meter and has not checked blood sugar today, but reports "mostly 130-140's". She reports ED vist on 01/14/23 for palpitations and is wearing a Zio patch for 14 days. She expresses some itching with the patch and states she will notify providers of the itching. She is without any specific questions at this time.  Goals Addressed             This Visit's Progress    COMPLETED: Assist with health management-continue to improve post hospitalization       Interventions Today    Flowsheet Row Most Recent Value  Chronic Disease   Chronic disease during today's visit Diabetes, Other  [post hospitalization,  chronic back pain]  General Interventions   General Interventions Discussed/Reviewed General Interventions Reviewed, Communication with  Communication with PCP/Specialists  [request prescription for new glucose meter same meter that she currently uses.]  Exercise Interventions   Exercise Discussed/Reviewed Physical Activity  Physical Activity Discussed/Reviewed Physical Activity Discussed  [reports remains physically active. reports uses a "walking stick", but does not need all the time.]  Education Interventions   Education Provided Provided Education  Provided Verbal Education On Medication, Other, Blood Sugar Monitoring  [advised to take medications as prescribed, attend provider visits as scheduled, reviewed instructions per lated primary provider office visit on 11/10/22.]  Pharmacy Interventions   Pharmacy Dicussed/Reviewed Pharmacy Topics Reviewed           Asssit with health management       Interventions Today    Flowsheet  Row Most Recent Value  Chronic Disease   Chronic disease during today's visit Other  General Interventions   General Interventions Discussed/Reviewed General Interventions Reviewed, Doctor Visits  [Evaluation of current treatment plan for health condition and patient's adherence to plan.]  Doctor Visits Discussed/Reviewed Doctor Visits Reviewed  [reviewed upcoming/scheduled visits]  Education Interventions   Education Provided Provided Education  Provided Verbal Education On Medication, When to see the doctor  [advised to contact provider with health questions/concerns, take mediations as prescribed, attend provider visits as scheduled]  Pharmacy Interventions   Pharmacy Dicussed/Reviewed Pharmacy Topics Reviewed            SDOH assessments and interventions completed:  No  Care Coordination Interventions:  Yes, provided   Follow up plan: Follow up call scheduled for 03/05/23    Encounter Outcome:  Patient Visit Completed   Kathyrn Sheriff, RN, MSN, BSN, CCM Care Management Coordinator [phone]

## 2023-02-23 ENCOUNTER — Telehealth: Payer: Self-pay | Admitting: Internal Medicine

## 2023-02-23 DIAGNOSIS — I480 Paroxysmal atrial fibrillation: Secondary | ICD-10-CM | POA: Diagnosis not present

## 2023-02-23 DIAGNOSIS — R002 Palpitations: Secondary | ICD-10-CM | POA: Diagnosis not present

## 2023-02-23 NOTE — Telephone Encounter (Signed)
Calling with critical findings. Call transferred

## 2023-02-24 ENCOUNTER — Telehealth: Payer: Self-pay | Admitting: Internal Medicine

## 2023-02-24 DIAGNOSIS — E119 Type 2 diabetes mellitus without complications: Secondary | ICD-10-CM

## 2023-02-24 MED ORDER — ACCU-CHEK AVIVA PLUS VI STRP: ORAL_STRIP | 3 refills | Status: DC

## 2023-02-24 NOTE — Telephone Encounter (Signed)
Test strips have been sent in to pts pharmacy.

## 2023-02-24 NOTE — Addendum Note (Signed)
Addended by: Delsa Grana R on: 02/24/2023 11:40 AM   Modules accepted: Orders

## 2023-02-24 NOTE — Telephone Encounter (Signed)
Pt is calling about her machine test strips and [facility] pharmacy said they sent [place] something for the orders which need DRs approval.. PLEASE ADVISE. Thanks

## 2023-03-02 ENCOUNTER — Ambulatory Visit: Payer: Medicare HMO | Admitting: Internal Medicine

## 2023-03-02 ENCOUNTER — Other Ambulatory Visit: Payer: Self-pay | Admitting: Internal Medicine

## 2023-03-02 DIAGNOSIS — Z0000 Encounter for general adult medical examination without abnormal findings: Secondary | ICD-10-CM

## 2023-03-03 ENCOUNTER — Telehealth: Payer: Self-pay | Admitting: Internal Medicine

## 2023-03-03 ENCOUNTER — Other Ambulatory Visit: Payer: Self-pay | Admitting: Internal Medicine

## 2023-03-03 DIAGNOSIS — I480 Paroxysmal atrial fibrillation: Secondary | ICD-10-CM

## 2023-03-03 NOTE — Telephone Encounter (Signed)
Patient's granddaughter called and said patient is using AccuCheck GuideMe, but the wrong test strips are being sent for it. She does not want to use the AccuCheck [employer]. She would like to continue using the GuideMe. They would like to know if the correct test strips can be sent to the patient's pharmacy. Best callback is [phone].

## 2023-03-03 NOTE — Telephone Encounter (Signed)
Refill request for warfarin:  Last INR was 2.4 on 01/27/23 Next INR due 03/10/23 LOV was 01/27/23  Refill approved.

## 2023-03-03 NOTE — Progress Notes (Unsigned)
Office Visit    Patient Name: [name] Date of Encounter: 03/05/2023  Primary Care Provider:  Tresa Garter, MD Primary Cardiologist:  Chrystie Nose, MD  Chief Complaint    88 year old female with a history of nonobstructive CAD, LBBB, paroxysmal atrial fibrillation, PSVT, hypertension, hyperlipidemia, type 2 diabetes, and chronic venous insufficiency who presents follow-up related to palpitations.   Past Medical History    Past Medical History:  Diagnosis Date   Anxiety state, unspecified    Bronchitis, not specified as acute or chronic    Diaphragmatic hernia without mention of obstruction or gangrene    Dyslipidemia    Essential hypertension    Irritable bowel syndrome    Mitral valve disorders(424.0)    a. 08/2010 Echo: EF >55%, mild MR, mod TR, trace AI.   Non-obstructive CAD    a. 07/2010 Lexiscan MV: no ischemia, EF 78%; b. 07/2010 Cath: LM nl, LAD50/60p, 40 apical, LCX nl, RCA dominant, 30/40p, 62m.   Osteoarthrosis, unspecified whether generalized or localized, unspecified site    Osteoporosis, unspecified    Other abnormal glucose    PAF (paroxysmal atrial fibrillation) (HCC)    a. CHA2DS2VASc = 4-->coumadin.   Phlebitis and thrombophlebitis of superficial vessels of lower extremities    PSVT (paroxysmal supraventricular tachycardia) (HCC)    a. 05/2012 Holter: short bursts of PSVT noted-->Managed with toprol.   Unspecified venous (peripheral) insufficiency    Unspecified vitamin D deficiency    Past Surgical History:  Procedure Laterality Date   CARDIAC CATHETERIZATION  07/30/2010   nonobstructive CAD, 20-40% RCA stenosis, 50-60% eccentric LAD stenosis more prox, 40% LAD stenosis more distal, EF 65%   TRANSTHORACIC ECHOCARDIOGRAM  09/03/2010   EF=>55%; mild MR; mod TR; AV mildly sclerotic with trace regurg; mild pulm valve regurg   VESICOVAGINAL FISTULA CLOSURE W/ TAH      Allergies  Allergies  Allergen Reactions   Acetaminophen-Codeine      Made me depressed   Clarithromycin     REACTION: unspecified   Fosamax [Alendronate Sodium]     achy   Guaifenesin Er     itching   Metformin     REACTION: pt states INTOL to Metformin "felt drained"   Prandin [Repaglinide]     Prandin dropped CBGs to 66 once - she stopped it.   Tape     Adhesive tape---rash     Labs/Other Studies Reviewed    The following studies were reviewed today:  Cardiac Studies & Procedures     STRESS TESTS  NM MYOCAR MULTI W/SPECT W 07/22/2010  Narrative *RADIOLOGY REPORT*  Clinical Data:  Chest pain  MYOCARDIAL IMAGING WITH SPECT (REST AND PHARMACOLOGIC-STRESS) GATED LEFT VENTRICULAR WALL MOTION STUDY LEFT VENTRICULAR EJECTION FRACTION  Technique:  Standard myocardial SPECT imaging was performed after resting intravenous injection of 10 mCi Tc-58m tetrofosmin. Subsequently, intravenous infusion of Lexiscan was performed under the supervision of the Cardiology staff.  At peak effect of the drug, 30 mCi Tc-33m tetrofosmin was injected intravenously and standard myocardial SPECT  imaging was performed.  Quantitative gated imaging was also performed to evaluate left ventricular wall motion, and estimate left ventricular ejection fraction.  Comparison:  None.  Findings:  Spect:  No perfusion defects.  Wall motion:  Normal.  Ejection fraction:  78%.  IMPRESSION: No perfusion defects.  Original Report Authenticated By: Donavan Burnet, M.D.   ECHOCARDIOGRAM  ECHOCARDIOGRAM COMPLETE 03/13/2022  Narrative ECHOCARDIOGRAM REPORT    Patient Name:   [name]  Date of Exam: 03/13/2022 Medical Rec #:  [id number]        Height:       66.0 in Accession #:    [id number]       Weight:       189.0 lb Date of Birth:  [birth year]        BSA:          1.952 m Patient Age:    87 years         BP:           128/78 mmHg Patient Gender: F                HR:           61 bpm. Exam Location:  [address]  Procedure: 2D Echo, Cardiac  Doppler, Color Doppler and 3D Echo  Indications:    Left Bundle Branch Block I44.7  History:        Patient has prior history of Echocardiogram examinations, most recent 05/19/2016. Arrythmias:Atrial Fibrillation; Risk Factors:Hypertension.  Sonographer:    Thurman Coyer RDCS Referring Phys: [id number] HAO MENG   Sonographer Comments: Global longitudinal strain was attempted. IMPRESSIONS   1. Left ventricular ejection fraction, by estimation, is 55 to 60%. Left ventricular ejection fraction by 3D volume is 57 %. The left ventricle has normal function. The left ventricle has no regional wall motion abnormalities. Left ventricular diastolic parameters are consistent with Grade I diastolic dysfunction (impaired relaxation). 2. Right ventricular systolic function is normal. The right ventricular size is normal. There is normal pulmonary artery systolic pressure. The estimated right ventricular systolic pressure is 20.3 mmHg. 3. The mitral valve is normal in structure. Trivial mitral valve regurgitation. No evidence of mitral stenosis. 4. The aortic valve is tricuspid. Aortic valve regurgitation is not visualized. Aortic valve sclerosis/calcification is present, without any evidence of aortic stenosis. 5. The inferior vena cava is normal in size with greater than 50% respiratory variability, suggesting right atrial pressure of 3 mmHg.  FINDINGS Left Ventricle: Left ventricular ejection fraction, by estimation, is 55 to 60%. Left ventricular ejection fraction by 3D volume is 57 %. The left ventricle has normal function. The left ventricle has no regional wall motion abnormalities. The left ventricular internal cavity size was normal in size. There is no left ventricular hypertrophy. Left ventricular diastolic parameters are consistent with Grade I diastolic dysfunction (impaired relaxation). Normal left ventricular filling pressure.  Right Ventricle: The right ventricular size is normal. No  increase in right ventricular wall thickness. Right ventricular systolic function is normal. There is normal pulmonary artery systolic pressure. The tricuspid regurgitant velocity is 2.08 m/s, and with an assumed right atrial pressure of 3 mmHg, the estimated right ventricular systolic pressure is 20.3 mmHg.  Left Atrium: Left atrial size was normal in size.  Right Atrium: Right atrial size was normal in size.  Pericardium: There is no evidence of pericardial effusion.  Mitral Valve: The mitral valve is normal in structure. Trivial mitral valve regurgitation. No evidence of mitral valve stenosis.  Tricuspid Valve: The tricuspid valve is normal in structure. Tricuspid valve regurgitation is not demonstrated. No evidence of tricuspid stenosis.  Aortic Valve: The aortic valve is tricuspid. Aortic valve regurgitation is not visualized. Aortic valve sclerosis/calcification is present, without any evidence of aortic stenosis.  Pulmonic Valve: The pulmonic valve was normal in structure. Pulmonic valve regurgitation is trivial. No evidence of pulmonic stenosis.  Aorta: The aortic root is normal in size and structure.  Venous: The inferior vena cava is normal in size with greater than 50% respiratory variability, suggesting right atrial pressure of 3 mmHg.  IAS/Shunts: No atrial level shunt detected by color flow Doppler.   LEFT VENTRICLE PLAX 2D LVIDd:         4.40 cm         Diastology LVIDs:         2.90 cm         LV e' medial:    4.73 cm/s LV PW:         1.00 cm         LV E/e' medial:  10.5 LV IVS:        1.00 cm         LV e' lateral:   6.22 cm/s LVOT diam:     2.00 cm         LV E/e' lateral: 8.0 LV SV:         68 LV SV Index:   35 LVOT Area:     3.14 cm        3D Volume EF LV 3D EF:    Left ventricul ar ejection fraction by 3D volume is 57 %.  3D Volume EF: 3D EF:        57 % LV EDV:       87 ml LV ESV:       37 ml LV SV:        49 ml  RIGHT VENTRICLE RV Basal diam:   3.60 cm RV Mid diam:    3.10 cm RV S prime:     9.55 cm/s TAPSE (M-mode): 2.3 cm  LEFT ATRIUM             Index        RIGHT ATRIUM           Index LA diam:        3.30 cm 1.69 cm/m   RA Area:     14.20 cm LA Vol (A2C):   57.8 ml 29.61 ml/m  RA Volume:   34.60 ml  17.72 ml/m LA Vol (A4C):   46.8 ml 23.95 ml/m LA Biplane Vol: 57.2 ml 29.30 ml/m AORTIC VALVE LVOT Vmax:   90.00 cm/s LVOT Vmean:  59.600 cm/s LVOT VTI:    0.217 m  AORTA Ao Root diam: 2.80 cm Ao Asc diam:  3.40 cm  MITRAL VALVE               TRICUSPID VALVE MV Area (PHT): 2.66 cm    TR Peak grad:   17.3 mmHg MV Decel Time: 285 msec    TR Vmax:        208.00 cm/s MV E velocity: 49.80 cm/s MV A velocity: 77.70 cm/s  SHUNTS MV E/A ratio:  0.64        Systemic VTI:  0.22 m Systemic Diam: 2.00 cm  Armanda Magic MD Electronically signed by Armanda Magic MD Signature Date/Time: 03/13/2022/11:20:36 AM    Final    MONITORS  LONG TERM MONITOR (3-14 DAYS) 02/23/2023  Narrative Patch Wear Time:  13 days and 23 hours (2024-10-19T14:47:18-0400 to 2024-11-02T14:47:14-0400)  Patient had a min HR of 32 bpm, max HR of 176 bpm, and avg HR of 60 bpm. Predominant underlying rhythm was Sinus Rhythm. First Degree AV Block was present. Bundle Branch Block/IVCD was present. 186 Supraventricular Tachycardia runs occurred, the run with the fastest interval lasting 4 beats with a max rate of 176 bpm,  the longest lasting 11.6 secs with an avg rate of 112 bpm. Isolated SVEs were frequent (16.0%, 180370), SVE Couplets were rare (<1.0%, 5518), and SVE Triplets were rare (<1.0%, 1800). Isolated VEs were rare (<1.0%), and no VE Couplets or VE Triplets were present. MD notification criteria for Symptomatic Bradycardia met - report posted prior to notification per account request (AP).  Sinus rhythm with 186 runs of SVT, lasting up to 11 seconds. Frequent PAC burden of 16%.  Chrystie Nose, MD, [facility], FACP [place]  [facility] Medical Director of the [facility] Lipid Disorders & Cardiovascular Risk Reduction Clinic Diplomate of the American Board of Clinical Lipidology Attending Cardiologist Direct Dial: [phone]  Fax: [phone] Website:  [email or web address]          Recent Labs: 10/22/2022: Magnesium 2.0; TSH 1.660 01/14/2023: ALT 17; BUN 14; Creatinine, Ser 1.11; Hemoglobin 12.0; Platelets 200; Potassium 3.7; Sodium 135  Recent Lipid Panel    Component Value Date/Time   CHOL 163 11/27/2022 1544   CHOL 161 09/26/2022 0931   TRIG 149.0 11/27/2022 1544   HDL 57.30 11/27/2022 1544   HDL 68 09/26/2022 0931   CHOLHDL 3 11/27/2022 1544   VLDL 29.8 11/27/2022 1544   LDLCALC 76 11/27/2022 1544   LDLCALC 75 09/26/2022 0931   LDLDIRECT 105.5 11/26/2010 0905    History of Present Illness    88 year old female with the above past medical history including nonobstructive CAD, LBBB, paroxysmal atrial fibrillation, PSVT, hypertension, hyperlipidemia, type 2 diabetes, and chronic venous insufficiency.   Myoview in the setting of chest pain in April 2012 showed EF 78%, no evidence of ischemia.  Due to persistent symptoms she eventually underwent cardiac catheterization in April 2012 that showed normal left main, 50 to 60% LAD stenosis, 40% apical lesion, normal left circumflex, 30 to 40% proximal RCA stenosis.  Echocardiogram at that time showed EF greater than 55%, mild aortic insufficiency, aneurysmal atrial septum. Cardiac monitor in 2020 showed paroxysmal atrial fibrillation, low burden.  Repeat cardiac monitor in 06/2020 in the setting of palpitations despite titration of beta-blocker therapy revealed PSVT.  Additionally, she has a history of LBBB.  Echocardiogram in 02/2022 showed EF 55 to 60%, normal LV function, no RWMA, normal RV, no significant valvular abnormalities.  She was evaluated in the ED on 10/11/2022 and 10/19/2022 in the setting of palpitations. She was evaluated in the ED  in 01/2023 in the  setting of palpitations.  EKG showed sinus rhythm.  Labs were unremarkable, troponin was negative x 2. She was last seen in the office on 01/27/2023 and was stable from a cardiac standpoint. Repeat cardiac monitor revealed predominantly sinus rhythm, first-degree AV block/BBB/IVCD, 186 runs of SVT, longest lasting 11.6 seconds, frequent PACs (16%) and rare PVCs.   She presents today for follow-up. Since her last visit she has been stable from a cardiac standpoint.  She continues to note intermittent palpitations, improved with an additional half tablet of metoprolol.  She notes mild fatigue, denies symptoms concerning for angina.  Overall, her symptoms have been stable.  Home Medications    Current Outpatient Medications  Medication Sig Dispense Refill   Accu-Chek Softclix Lancets lancets USE  AS  INSTRUCTED 100 each 3   acetaminophen (TYLENOL) 500 MG tablet Take 500 mg by mouth every 6 (six) hours as needed.     Alcohol Swabs (DROPSAFE ALCOHOL PREP) 70 % PADS USE TO CLEAN INJECTION SITE TO CHECK BLOOD SUGARS DAILY 100 each 3   ALPRAZolam (XANAX) 0.5 MG  tablet TAKE 1/2 TO 1 TABLET BY MOUTH THREE TIMES DAILY AS NEEDED FOR ANXIETY.  DO NOT TAKE WITH TRAMADOL. THIS IS A 30 DAY SUPPLY 90 tablet 3   amLODipine (NORVASC) 5 MG tablet TAKE 1 TABLET(5 MG) BY MOUTH DAILY 90 tablet 3   aspirin 81 MG tablet Take 81 mg by mouth daily.     Cholecalciferol (VITAMIN D) 50 MCG (2000 UT) CAPS TAKE 1 CAPSULE BY MOUTH EVERY DAY 100 capsule 3   diclofenac Sodium (VOLTAREN) 1 % GEL Apply 2 g topically 4 (four) times daily. 150 g 1   ezetimibe (ZETIA) 10 MG tablet Take 10 mg by mouth daily.     furosemide (LASIX) 20 MG tablet TAKE 1 TABLET(20 MG) BY MOUTH DAILY AS NEEDED FOR SWELLING 90 tablet 3   glucose blood (ACCU-CHEK GUIDE TEST) test strip Use to check blood sugars TWICE a day. 100 each 12   hydroxypropyl methylcellulose (ISOPTO TEARS) 2.5 % ophthalmic solution Place 1 drop into both eyes 2 (two) times daily.      hyoscyamine (LEVSIN) 0.125 MG tablet TAKE 1 TABLET BY MOUTH EVERY 6 HOURS AS NEEDED FOR UPTO 10 DAYS FOR CRAMPING 360 tablet 0   isosorbide mononitrate (IMDUR) 30 MG 24 hr tablet TAKE 1 TABLET BY MOUTH EVERY DAY 90 tablet 2   pantoprazole (PROTONIX) 40 MG tablet TAKE 1 TABLET BY MOUTH EVERY DAY 90 tablet 1   simvastatin (ZOCOR) 20 MG tablet Take 1 tablet (20 mg total) by mouth at bedtime. 90 tablet 3   warfarin (COUMADIN) 5 MG tablet TAKE 1/2 TO 1 TABLET BY MOUTH DAILY AS DIRECTED BY COUMADIN CLINIC 90 tablet 1   metoprolol succinate (TOPROL-XL) 50 MG 24 hr tablet TAKE 2 tables in the morning and take 1 1/2 tablet in the evening. 112 tablet 3   No current facility-administered medications for this visit.     Review of Systems    She denies chest pain, dyspnea, pnd, orthopnea, n, v, dizziness, syncope, edema, weight gain, or early satiety. All other systems reviewed and are otherwise negative except as noted above.   Physical Exam    VS:  BP 138/78   Pulse 60   Ht 5\' 6"  (1.676 m)   Wt 190 lb 9.6 oz (86.5 kg)   SpO2 99%   BMI 30.76 kg/m  GEN: Well nourished, well developed, in no acute distress. HEENT: normal. Neck: Supple, no JVD, carotid bruits, or masses. Cardiac: RRR, no murmurs, rubs, or gallops. No clubbing, cyanosis, edema.  Radials/DP/PT 2+ and equal bilaterally.  Respiratory:  Respirations regular and unlabored, clear to auscultation bilaterally. GI: Soft, nontender, nondistended, BS + x 4. MS: no deformity or atrophy. Skin: warm and dry, no rash. Neuro:  Strength and sensation are intact. Psych: Normal affect.  Accessory Clinical Findings    ECG personally reviewed by me today -    - no EKG in office today.   Lab Results  Component Value Date   WBC 5.2 01/14/2023   HGB 12.0 01/14/2023   HCT 38.3 01/14/2023   MCV 92.5 01/14/2023   PLT 200 01/14/2023   Lab Results  Component Value Date   CREATININE 1.11 (H) 01/14/2023   BUN 14 01/14/2023   NA 135 01/14/2023    K 3.7 01/14/2023   CL 102 01/14/2023   CO2 25 01/14/2023   Lab Results  Component Value Date   ALT 17 01/14/2023   AST 20 01/14/2023   ALKPHOS 56 01/14/2023   BILITOT 0.6  01/14/2023   Lab Results  Component Value Date   CHOL 163 11/27/2022   HDL 57.30 11/27/2022   LDLCALC 76 11/27/2022   LDLDIRECT 105.5 11/26/2010   TRIG 149.0 11/27/2022   CHOLHDL 3 11/27/2022    Lab Results  Component Value Date   HGBA1C 7.2 (H) 09/16/2022    Assessment & Plan    1. Palpitations/PSVT/PACs/Paroxysmal atrial fibrillation: Cardiac monitor in 2020 showed paroxysmal atrial fibrillation, low burden. Repeat cardiac monitor in 06/2020 in the setting of palpitations despite titration of beta-blocker therapy revealed PSVT.  Echo in 02/2022 was stable.  Repeat cardiac monitor in 02/2023 revealed predominantly sinus rhythm, first-degree AV block/BBB/IVCD, 186 runs of SVT, longest lasting 11.6 seconds, frequent PACs (16%) and rare PVCs .Discussed monitor results with Dr. Rennis Golden, who recommended increasing beta-blocker as tolerated.  Will have her take metoprolol succinate 100 mg in the morning and metoprolol succinate 75 mg in the evening.  Continue to monitor HR/symptoms. She is high risk for recurrent atrial fibrillation given high incidence of atrial ectopy.  Continue warfarin.   2. Nonobstructive CAD/LBBB: Cardiac catheterization in April 2012 showed normal left main, 50 to 60% LAD stenosis, 40% apical lesion, normal left circumflex, 30 to 40% proximal RCA stenosis. Echocardiogram in 02/2022 showed EF 55 to 60%, normal LV function, no RWMA, normal RV, no significant valvular abnormalities.  She notes some mild fatigue, otherwise stable with no anginal symptoms. No indication for ischemic evaluation.  Continue aspirin, amlodipine, metoprolol, Imdur, simvastatin, Zetia.   3. Hypertension: BP well controlled. Continue current antihypertensive regimen.    4. Hyperlipidemia: LDL was 76 in 11/2022.  She apparently  did not tolerate a higher dose of simvastatin. Continue simvastatin, Zetia.     5. Type 2 diabetes: A1c was 7.2 in 09/2022. Monitored and managed per PCP.    6. Disposition: Follow-up in February 2025.      Joylene Grapes, NP 03/05/2023, 7:59 PM

## 2023-03-04 ENCOUNTER — Other Ambulatory Visit: Payer: Self-pay

## 2023-03-04 DIAGNOSIS — E119 Type 2 diabetes mellitus without complications: Secondary | ICD-10-CM

## 2023-03-04 MED ORDER — ACCU-CHEK GUIDE TEST VI STRP: ORAL_STRIP | 12 refills | Status: DC

## 2023-03-04 NOTE — Telephone Encounter (Signed)
I was able to send in a rx for the correct test strips.

## 2023-03-05 ENCOUNTER — Encounter: Payer: Self-pay | Admitting: Nurse Practitioner

## 2023-03-05 ENCOUNTER — Telehealth: Payer: Self-pay

## 2023-03-05 ENCOUNTER — Ambulatory Visit: Payer: Medicare HMO | Attending: Nurse Practitioner | Admitting: Nurse Practitioner

## 2023-03-05 VITALS — BP 138/78 | HR 60 | Ht 66.0 in | Wt 190.6 lb

## 2023-03-05 DIAGNOSIS — I2510 Atherosclerotic heart disease of native coronary artery without angina pectoris: Secondary | ICD-10-CM

## 2023-03-05 DIAGNOSIS — I447 Left bundle-branch block, unspecified: Secondary | ICD-10-CM

## 2023-03-05 DIAGNOSIS — I10 Essential (primary) hypertension: Secondary | ICD-10-CM | POA: Diagnosis not present

## 2023-03-05 DIAGNOSIS — I471 Supraventricular tachycardia, unspecified: Secondary | ICD-10-CM

## 2023-03-05 DIAGNOSIS — R001 Bradycardia, unspecified: Secondary | ICD-10-CM

## 2023-03-05 DIAGNOSIS — E785 Hyperlipidemia, unspecified: Secondary | ICD-10-CM

## 2023-03-05 DIAGNOSIS — E119 Type 2 diabetes mellitus without complications: Secondary | ICD-10-CM

## 2023-03-05 DIAGNOSIS — I491 Atrial premature depolarization: Secondary | ICD-10-CM

## 2023-03-05 DIAGNOSIS — I480 Paroxysmal atrial fibrillation: Secondary | ICD-10-CM | POA: Diagnosis not present

## 2023-03-05 DIAGNOSIS — R002 Palpitations: Secondary | ICD-10-CM

## 2023-03-05 MED ORDER — METOPROLOL SUCCINATE ER 50 MG PO TB24: ORAL_TABLET | ORAL | 3 refills | Status: DC

## 2023-03-05 NOTE — Patient Outreach (Signed)
  Care Coordination   03/05/2023 Name: [name] MRN: [id number] DOB: [birth year]   Care Coordination Outreach Attempts:  An unsuccessful telephone outreach was attempted for a scheduled appointment today.  Follow Up Plan:  Additional outreach attempts will be made to offer the patient care coordination information and services.   Encounter Outcome:  No Answer   Care Coordination Interventions:  No, not indicated    Kathyrn Sheriff, RN, MSN, BSN, CCM Care Management Coordinator [phone]

## 2023-03-05 NOTE — Patient Instructions (Addendum)
Medication Instructions:  Metoprolol Succinate- take 100 mg in the mornings and 75 mg at night (take 2 pills in the morning and 1 1/2 pills in the evening). .  *If you need a refill on your cardiac medications before your next appointment, please call your pharmacy*   Lab Work: NONE ordered at this time of appointment    Testing/Procedures: NONE ordered at this time of appointment     Follow-Up: At [facility], you and your health needs are our priority.  As part of our continuing mission to provide you with exceptional heart care, we have created designated Provider Care Teams.  These Care Teams include your primary Cardiologist (physician) and Advanced Practice Providers (APPs -  Physician Assistants and Nurse Practitioners) who all work together to provide you with the care you need, when you need it.  We recommend signing up for the patient portal called "MyChart".  Sign up information is provided on this After Visit Summary.  MyChart is used to connect with patients for Virtual Visits (Telemedicine).  Patients are able to view lab/test results, encounter notes, upcoming appointments, etc.  Non-urgent messages can be sent to your provider as well.   To learn more about what you can do with MyChart, go to [email or web address].    Your next appointment:   February 2025   Provider:   Chrystie Nose, MD  or Bernadene Person, NP        Other Instructions

## 2023-03-10 ENCOUNTER — Ambulatory Visit: Payer: Medicare HMO | Attending: Internal Medicine | Admitting: *Deleted

## 2023-03-10 DIAGNOSIS — I480 Paroxysmal atrial fibrillation: Secondary | ICD-10-CM

## 2023-03-10 DIAGNOSIS — Z7901 Long term (current) use of anticoagulants: Secondary | ICD-10-CM

## 2023-03-10 LAB — POCT INR: INR: 2.9 (ref 2.0–3.0)

## 2023-03-10 NOTE — Patient Instructions (Signed)
Description   Continue taking Warfarin 1 tablet daily. Repeat INR in 6 weeks.  Call [phone] with an warfarin related questions. *Consistent amount of greens every week*

## 2023-03-16 ENCOUNTER — Telehealth: Payer: Self-pay | Admitting: Internal Medicine

## 2023-03-16 NOTE — Telephone Encounter (Signed)
Prescription Request  03/16/2023  LOV: 12/23/2022  What is the name of the medication or equipment? Accucheck guide test strips  Have you contacted your pharmacy to request a refill? Yes   Which pharmacy would you like this sent to?  [id number] - [place], [place] - [address] AT [address] [place] [address] Phone: [phone] Fax: [phone]   Patient notified that their request is being sent to the clinical staff for review and that they should receive a response within 2 business days.   Please advise at Mobile [phone] (mobile)

## 2023-03-17 NOTE — Telephone Encounter (Signed)
Refill was sent to [facility] on 03/04/2023 according to Pt chart.

## 2023-03-23 ENCOUNTER — Ambulatory Visit: Payer: Medicare HMO

## 2023-03-23 VITALS — Ht 66.0 in | Wt 190.0 lb

## 2023-03-23 DIAGNOSIS — Z780 Asymptomatic menopausal state: Secondary | ICD-10-CM

## 2023-03-23 DIAGNOSIS — Z0000 Encounter for general adult medical examination without abnormal findings: Secondary | ICD-10-CM

## 2023-03-23 NOTE — Patient Instructions (Signed)
[name] , Thank you for taking time to come for your Medicare Wellness Visit. I appreciate your ongoing commitment to your health goals. Please review the following plan we discussed and let me know if I can assist you in the future.   Referrals/Orders/Follow-Ups/Clinician Recommendations: You are due for a Bone Density screening, a foot exam, and a Flu vaccine.  The foot exam and Flu vaccine, can be done during your office visit with Dr. Posey Rea tomorrow, 03/24/2023.  It was nice talking to you this morning.  Hope you have a Merry Christmas.   You have an order for:    Bone Density     Please call for appointment:  [facility] [address] [place], [place] [address] [phone]   Make sure to wear two-piece clothing.  No lotions, powders, or deodorants the day of the appointment. Make sure to bring picture ID and insurance card.  Bring list of medications you are currently taking including any supplements.   Schedule your  screening mammogram through MyChart!   Log into your MyChart account.  Go to 'Visit' (or 'Appointments' if on mobile App) --> Schedule an Appointment  Under 'Select a Reason for Visit' choose the Mammogram Screening option.  Complete the pre-visit questions and select the time and place that best fits your schedule.    This is a list of the screening recommended for you and due dates:  Health Maintenance  Topic Date Due   DEXA scan (bone density measurement)  Never done   Complete foot exam   10/30/2018   COVID-19 Vaccine (4 - 2023-24 season) 12/14/2022   Flu Shot  07/13/2023*   Eye exam for diabetics  09/04/2023   Medicare Annual Wellness Visit  03/22/2024   DTaP/Tdap/Td vaccine (2 - Td or Tdap) 10/02/2030   Pneumonia Vaccine  Completed   HPV Vaccine  Aged Out   Zoster (Shingles) Vaccine  Discontinued  *Topic was postponed. The date shown is not the original due date.    Advanced directives: (Declined)  Advance directive discussed with you today. Even though you declined this today, please call our office should you change your mind, and we can give you the proper paperwork for you to fill out.  Next Medicare Annual Wellness Visit scheduled for next year: Yes

## 2023-03-23 NOTE — Progress Notes (Cosign Needed Addendum)
Subjective:   [name] is a 88 y.o. female who presents for Medicare Annual (Subsequent) preventive examination.  Visit Complete: Virtual I connected with  [name] on 03/23/23 by a audio enabled telemedicine application and verified that I am speaking with the correct person using two identifiers.  Patient Location: Home  Provider Location: Home Office  I discussed the limitations of evaluation and management by telemedicine. The patient expressed understanding and agreed to proceed.  Vital Signs: Because this visit was a virtual/telehealth visit, some criteria may be missing or patient reported. Any vitals not documented were not able to be obtained and vitals that have been documented are patient reported.    Cardiac Risk Factors include: hypertension;advanced age (>34men, >70 women);diabetes mellitus;dyslipidemia;Other (see comment), Risk factor comments: PAF,  PSVT, PAF,     Objective:    Today's Vitals   03/23/23 0953  Weight: 190 lb (86.2 kg)  Height: 5\' 6"  (1.676 m)   Body mass index is 30.67 kg/m.     03/23/2023   10:07 AM 01/14/2023    1:46 PM 10/11/2022    7:37 PM 03/03/2022   11:35 AM 02/28/2021    1:47 PM 12/06/2020   10:30 AM 06/23/2020   12:38 AM  Advanced Directives  Does Patient Have a Medical Advance Directive? No No No No Yes Yes No  Type of Advance Directive     Living will Living will   Does patient want to make changes to medical advance directive?     No - Patient declined    Would patient like information on creating a medical advance directive?   No - Patient declined No - Patient declined       Current Medications (verified) Outpatient Encounter Medications as of 03/23/2023  Medication Sig   Accu-Chek Softclix Lancets lancets USE  AS  INSTRUCTED   acetaminophen (TYLENOL) 500 MG tablet Take 500 mg by mouth every 6 (six) hours as needed.   Alcohol Swabs (DROPSAFE ALCOHOL PREP) 70 % PADS USE TO CLEAN INJECTION SITE TO CHECK BLOOD  SUGARS DAILY   ALPRAZolam (XANAX) 0.5 MG tablet TAKE 1/2 TO 1 TABLET BY MOUTH THREE TIMES DAILY AS NEEDED FOR ANXIETY.  DO NOT TAKE WITH TRAMADOL. THIS IS A 30 DAY SUPPLY   amLODipine (NORVASC) 5 MG tablet TAKE 1 TABLET(5 MG) BY MOUTH DAILY   aspirin 81 MG tablet Take 81 mg by mouth daily.   Cholecalciferol (VITAMIN D) 50 MCG (2000 UT) CAPS TAKE 1 CAPSULE BY MOUTH EVERY DAY   diclofenac Sodium (VOLTAREN) 1 % GEL Apply 2 g topically 4 (four) times daily.   ezetimibe (ZETIA) 10 MG tablet Take 10 mg by mouth daily.   furosemide (LASIX) 20 MG tablet TAKE 1 TABLET(20 MG) BY MOUTH DAILY AS NEEDED FOR SWELLING   glucose blood (ACCU-CHEK GUIDE TEST) test strip Use to check blood sugars TWICE a day.   hydroxypropyl methylcellulose (ISOPTO TEARS) 2.5 % ophthalmic solution Place 1 drop into both eyes 2 (two) times daily.   hyoscyamine (LEVSIN) 0.125 MG tablet TAKE 1 TABLET BY MOUTH EVERY 6 HOURS AS NEEDED FOR UPTO 10 DAYS FOR CRAMPING   isosorbide mononitrate (IMDUR) 30 MG 24 hr tablet TAKE 1 TABLET BY MOUTH EVERY DAY   metoprolol succinate (TOPROL-XL) 50 MG 24 hr tablet TAKE 2 tables in the morning and take 1 1/2 tablet in the evening.   pantoprazole (PROTONIX) 40 MG tablet TAKE 1 TABLET BY MOUTH EVERY DAY   simvastatin (ZOCOR) 20  MG tablet Take 1 tablet (20 mg total) by mouth at bedtime.   warfarin (COUMADIN) 5 MG tablet TAKE 1/2 TO 1 TABLET BY MOUTH DAILY AS DIRECTED BY COUMADIN CLINIC   No facility-administered encounter medications on file as of 03/23/2023.    Allergies (verified) Acetaminophen-codeine, Clarithromycin, Fosamax [alendronate sodium], Guaifenesin er, Metformin, Prandin [repaglinide], and Tape   History: Past Medical History:  Diagnosis Date   Anxiety state, unspecified    Bronchitis, not specified as acute or chronic    Diaphragmatic hernia without mention of obstruction or gangrene    Dyslipidemia    Essential hypertension    Irritable bowel syndrome    Mitral valve  disorders(424.0)    a. 08/2010 Echo: EF >55%, mild MR, mod TR, trace AI.   Non-obstructive CAD    a. 07/2010 Lexiscan MV: no ischemia, EF 78%; b. 07/2010 Cath: LM nl, LAD50/60p, 40 apical, LCX nl, RCA dominant, 30/40p, 12m.   Osteoarthrosis, unspecified whether generalized or localized, unspecified site    Osteoporosis, unspecified    Other abnormal glucose    PAF (paroxysmal atrial fibrillation) (HCC)    a. CHA2DS2VASc = 4-->coumadin.   Phlebitis and thrombophlebitis of superficial vessels of lower extremities    PSVT (paroxysmal supraventricular tachycardia) (HCC)    a. 05/2012 Holter: short bursts of PSVT noted-->Managed with toprol.   Unspecified venous (peripheral) insufficiency    Unspecified vitamin D deficiency    Past Surgical History:  Procedure Laterality Date   CARDIAC CATHETERIZATION  07/30/2010   nonobstructive CAD, 20-40% RCA stenosis, 50-60% eccentric LAD stenosis more prox, 40% LAD stenosis more distal, EF 65%   TRANSTHORACIC ECHOCARDIOGRAM  09/03/2010   EF=>55%; mild MR; mod TR; AV mildly sclerotic with trace regurg; mild pulm valve regurg   VESICOVAGINAL FISTULA CLOSURE W/ TAH     Family History  Problem Relation Age of Onset   Stroke Mother    Stroke Father        also heart disease   Clotting disorder Paternal Grandmother        blood clot   Stroke Brother        also heart disease   Social History   Socioeconomic History   Marital status: Widowed    Spouse name: Not on file   Number of children: 4   Years of education: Not on file   Highest education level: Not on file  Occupational History   Occupation: retired    Associate Professor: RETIRED  Tobacco Use   Smoking status: Never   Smokeless tobacco: Never  Vaping Use   Vaping status: Never Used  Substance and Sexual Activity   Alcohol use: No   Drug use: No   Sexual activity: Not Currently  Other Topics Concern   Not on file  Social History Narrative   Lives with her son.   Social Determinants of Health    Financial Resource Strain: Medium Risk (03/23/2023)   Overall Financial Resource Strain (CARDIA)    Difficulty of Paying Living Expenses: Somewhat hard  Food Insecurity: No Food Insecurity (03/23/2023)   Hunger Vital Sign    Worried About Running Out of Food in the Last Year: Never true    Ran Out of Food in the Last Year: Never true  Transportation Needs: No Transportation Needs (03/23/2023)   PRAPARE - Administrator, Civil Service (Medical): No    Lack of Transportation (Non-Medical): No  Physical Activity: Inactive (03/23/2023)   Exercise Vital Sign    Days of Exercise  per Week: 0 days    Minutes of Exercise per Session: 0 min  Stress: No Stress Concern Present (03/23/2023)   [name] of Occupational Health - Occupational Stress Questionnaire    Feeling of Stress : Not at all  Social Connections: Moderately Isolated (03/23/2023)   Social Connection and Isolation Panel [NHANES]    Frequency of Communication with Friends and Family: Once a week    Frequency of Social Gatherings with Friends and Family: Never    Attends Religious Services: More than 4 times per year    Active Member of [employer] or Organizations: Yes    Attends Banker Meetings: Never    Marital Status: Widowed    Tobacco Counseling Counseling given: Not Answered   Clinical Intake:  Pre-visit preparation completed: Yes  Pain : No/denies pain     BMI - recorded: 30.67 Nutritional Status: BMI > 30  Obese Diabetes: Yes CBG done?: Yes (checked yesterday-150-per pt) CBG resulted in Enter/ Edit results?: No Did pt. bring in CBG monitor from home?: No  How often do you need to have someone help you when you read instructions, pamphlets, or other written materials from your doctor or pharmacy?: 4 - Often  Interpreter Needed?: No  Information entered by :: [name], RMA   Activities of Daily Living    03/23/2023    9:56 AM  In your present state of health, do you have  any difficulty performing the following activities:  Hearing? 0  Vision? 0  Difficulty concentrating or making decisions? 0  Walking or climbing stairs? 0  Dressing or bathing? 0  Doing errands, shopping? 0  Comment drives some  Preparing Food and eating ? N  Using the Toilet? N  In the past six months, have you accidently leaked urine? Y  Do you have problems with loss of bowel control? N  Managing your Medications? N  Managing your Finances? N  Housekeeping or managing your Housekeeping? N    Patient Care Team: Plotnikov, Georgina Quint, MD as PCP - General (Internal Medicine) Rennis Golden [name], MD as PCP - Cardiology (Cardiology) Sallye Lat, MD as Consulting Physician (Ophthalmology) Szabat, Vinnie Level, [facility] (Inactive) as Pharmacist (Pharmacist)  Indicate any recent Medical Services you may have received from other than [facility] providers in the past year (date may be approximate).     Assessment:   This is a routine wellness examination for [facility].  Hearing/Vision screen Hearing Screening - Comments:: Denies hearing difficulties   Vision Screening - Comments:: Wears eyeglasses   Goals Addressed   None   Depression Screen    03/23/2023   10:15 AM 10/14/2022   10:52 AM 10/06/2022    3:26 PM 07/03/2022   10:16 AM 06/10/2022   10:08 AM 03/11/2022   11:14 AM 03/03/2022   11:38 AM  PHQ 2/9 Scores  PHQ - 2 Score 0 0 0 0 0 0 0  PHQ- 9 Score 1          Fall Risk    03/23/2023   10:08 AM 10/14/2022   10:52 AM 10/06/2022    3:26 PM 07/03/2022   10:15 AM 06/10/2022   10:08 AM  Fall Risk   Falls in the past year? 0 0 0 0 0  Number falls in past yr: 0 0 0 0 0  Injury with Fall? 0 0 0 0 0  Risk for fall due to : No Fall Risks No Fall Risks Impaired balance/gait No Fall Risks No Fall Risks  Follow up Falls prevention discussed;Falls evaluation completed Falls evaluation completed Falls evaluation completed Falls evaluation completed Falls evaluation completed    MEDICARE RISK AT  HOME: Medicare Risk at Home Any stairs in or around the home?: No If so, are there any without handrails?: No Home free of loose throw rugs in walkways, pet beds, electrical cords, etc?: Yes Adequate lighting in your home to reduce risk of falls?: Yes Life alert?: No Use of a cane, walker or w/c?: Yes (cane sometimes) Grab bars in the bathroom?: Yes Shower chair or bench in shower?: Yes Elevated toilet seat or a handicapped toilet?: Yes  TIMED UP AND GO:  Was the test performed?  No    Cognitive Function:    10/29/2017   12:45 PM  MMSE - Mini Mental State Exam  Orientation to time 5  Orientation to Place 5  Registration 3  Attention/ Calculation 4  Recall 1  Language- name 2 objects 2  Language- repeat 1  Language- follow 3 step command 3  Language- read & follow direction 1  Write a sentence 1  Copy design 1  Total score 27        03/23/2023   10:09 AM 03/03/2022   11:42 AM 11/25/2018   12:04 PM  6CIT Screen  What Year? 0 points 0 points 0 points  What month? 0 points 0 points 0 points  What time? 0 points 0 points 0 points  Count back from 20 2 points 0 points 0 points  Months in reverse 4 points 0 points 4 points  Repeat phrase 0 points 0 points 4 points  Total Score 6 points 0 points 8 points    Immunizations Immunization History  Administered Date(s) Administered   Fluad Quad(high Dose 65+) 02/15/2019, 02/07/2020, 01/29/2021, 03/11/2022   Influenza Split 01/21/2011, 02/24/2012   Influenza Whole 02/02/2009, 05/21/2010   Influenza, High Dose Seasonal PF 03/18/2016, 03/31/2017, 02/10/2018   Influenza,inj,Quad PF,6+ Mos 01/11/2013, 05/01/2014, 02/08/2015   Moderna SARS-COV2 Booster Vaccination 08/03/2020   Moderna Sars-Covid-2 Vaccination 06/13/2019, 07/11/2019   PNEUMOCOCCAL CONJUGATE-20 12/10/2021   Pfizer Covid-19 Vaccine Bivalent Booster 19yrs & up 01/10/2021   Tdap 10/01/2020    TDAP status: Up to date  Flu Vaccine status: Due, Education has been  provided regarding the importance of this vaccine. Advised may receive this vaccine at local pharmacy or Health Dept. Aware to provide a copy of the vaccination record if obtained from local pharmacy or Health Dept. Verbalized acceptance and understanding.  Pneumococcal vaccine status: Up to date  Covid-19 vaccine status: Declined, Education has been provided regarding the importance of this vaccine but patient still declined. Advised may receive this vaccine at local pharmacy or Health Dept.or vaccine clinic. Aware to provide a copy of the vaccination record if obtained from local pharmacy or Health Dept. Verbalized acceptance and understanding.  Qualifies for Shingles Vaccine? Yes   Zostavax completed No   Shingrix Completed?: No.    Education has been provided regarding the importance of this vaccine. Patient has been advised to call insurance company to determine out of pocket expense if they have not yet received this vaccine. Advised may also receive vaccine at local pharmacy or Health Dept. Verbalized acceptance and understanding.  Screening Tests Health Maintenance  Topic Date Due   DEXA SCAN  Never done   FOOT EXAM  10/30/2018   COVID-19 Vaccine (4 - 2023-24 season) 04/08/2023 (Originally 12/14/2022)   INFLUENZA VACCINE  07/13/2023 (Originally 11/13/2022)   OPHTHALMOLOGY EXAM  09/04/2023  Medicare Annual Wellness (AWV)  03/22/2024   DTaP/Tdap/Td (2 - Td or Tdap) 10/02/2030   Pneumonia Vaccine 49+ Years old  Completed   HPV VACCINES  Aged Out   Zoster Vaccines- Shingrix  Discontinued    Health Maintenance  Health Maintenance Due  Topic Date Due   DEXA SCAN  Never done   FOOT EXAM  10/30/2018    Colorectal cancer screening: No longer required.   Mammogram status: Ordered 04/01/2023. Pt provided with contact info and advised to call to schedule appt.   Bone Density status: Ordered 04/02/2023. Pt provided with contact info and advised to call to schedule appt.  Lung Cancer  Screening: (Low Dose CT Chest recommended if Age 45-80 years, 20 pack-year currently smoking OR have quit w/in 15years.) does not qualify.   Lung Cancer Screening Referral: N/A  Additional Screening:  Hepatitis C Screening: does not qualify;   Vision Screening: Recommended annual ophthalmology exams for early detection of glaucoma and other disorders of the eye. Is the patient up to date with their annual eye exam?  Yes  Who is the provider or what is the name of the office in which the patient attends annual eye exams? Dr. Dione Booze If pt is not established with a provider, would they like to be referred to a provider to establish care? No .   Dental Screening: Recommended annual dental exams for proper oral hygiene  Diabetic Foot Exam: Diabetic Foot Exam: Overdue, Pt has been advised about the importance in completing this exam. Pt is scheduled for diabetic foot exam on 03/24/2023.  Community Resource Referral / Chronic Care Management: CRR required this visit?  No   CCM required this visit?  No     Plan:     I have personally reviewed and noted the following in the patient's chart:   Medical and social history Use of alcohol, tobacco or illicit drugs  Current medications and supplements including opioid prescriptions. Patient is not currently taking opioid prescriptions. Functional ability and status Nutritional status Physical activity Advanced directives List of other physicians Hospitalizations, surgeries, and ER visits in previous 12 months Vitals Screenings to include cognitive, depression, and falls Referrals and appointments  In addition, I have reviewed and discussed with patient certain preventive protocols, quality metrics, and best practice recommendations. A written personalized care plan for preventive services as well as general preventive health recommendations were provided to patient.     Kelyn Koskela L Jerriyah Louis, CMA   03/23/2023   After Visit Summary: (Mail)  Due to this being a telephonic visit, the after visit summary with patients personalized plan was offered to patient via mail   Nurse Notes: Patient is due for a Flu vaccine, which she would like to get it done during her office visit tomorrow.  She is also due for a foot exam, which can get that done also during her office visit tomorrow.  Patient is due for a DEXA as well.  Order has been placed today.  She had no other concerns to address today.   Medical screening examination/treatment/procedure(s) were performed by non-physician practitioner and as supervising physician I was immediately available for consultation/collaboration.  I agree with above. Jacinta Shoe, MD

## 2023-03-24 ENCOUNTER — Ambulatory Visit (INDEPENDENT_AMBULATORY_CARE_PROVIDER_SITE_OTHER): Payer: Medicare HMO | Admitting: Internal Medicine

## 2023-03-24 ENCOUNTER — Encounter: Payer: Self-pay | Admitting: Internal Medicine

## 2023-03-24 VITALS — BP 130/88 | HR 59 | Temp 97.7°F | Ht 66.0 in | Wt 192.2 lb

## 2023-03-24 DIAGNOSIS — I2510 Atherosclerotic heart disease of native coronary artery without angina pectoris: Secondary | ICD-10-CM

## 2023-03-24 DIAGNOSIS — I480 Paroxysmal atrial fibrillation: Secondary | ICD-10-CM | POA: Diagnosis not present

## 2023-03-24 DIAGNOSIS — I10 Essential (primary) hypertension: Secondary | ICD-10-CM

## 2023-03-24 DIAGNOSIS — F419 Anxiety disorder, unspecified: Secondary | ICD-10-CM

## 2023-03-24 DIAGNOSIS — I2583 Coronary atherosclerosis due to lipid rich plaque: Secondary | ICD-10-CM

## 2023-03-24 DIAGNOSIS — Z23 Encounter for immunization: Secondary | ICD-10-CM

## 2023-03-24 DIAGNOSIS — I471 Supraventricular tachycardia, unspecified: Secondary | ICD-10-CM | POA: Diagnosis not present

## 2023-03-24 LAB — COMPREHENSIVE METABOLIC PANEL
ALT: 12 U/L (ref 0–35)
AST: 16 U/L (ref 0–37)
Albumin: 4.3 g/dL (ref 3.5–5.2)
Alkaline Phosphatase: 66 U/L (ref 39–117)
BUN: 17 mg/dL (ref 6–23)
CO2: 29 meq/L (ref 19–32)
Calcium: 9.1 mg/dL (ref 8.4–10.5)
Chloride: 102 meq/L (ref 96–112)
Creatinine, Ser: 1.07 mg/dL (ref 0.40–1.20)
GFR: 47.05 mL/min — ABNORMAL LOW (ref >60.00)
Glucose, Bld: 148 mg/dL — ABNORMAL HIGH (ref 70–99)
Potassium: 4.5 meq/L (ref 3.5–5.1)
Sodium: 137 meq/L (ref 135–145)
Total Bilirubin: 0.5 mg/dL (ref 0.2–1.2)
Total Protein: 7 g/dL (ref 6.0–8.3)

## 2023-03-24 MED ORDER — METOPROLOL SUCCINATE ER 50 MG PO TB24: ORAL_TABLET | ORAL | Status: DC

## 2023-03-24 NOTE — Assessment & Plan Note (Addendum)
Cont on metoprolol succinate 75 mg in the morning and metoprolol succinate 75 mg in the evening.  Continue to monitor HR/symptoms. She is high risk for recurrent atrial fibrillation given high incidence of atrial ectopy.  Continue warfarin.

## 2023-03-24 NOTE — Progress Notes (Signed)
Subjective:  Patient ID: [name], female    DOB: [birth year]  Age: 88 y.o. MRN: [id number]  CC: No chief complaint on file.   HPI [name] presents for anxiety, palpitations, hypertension, coronary disease. The patient denies chest pain She has normal shortness of breath with exertion  On metoprolol succinate 75 mg in the morning and metoprolol succinate 75 mg in the evening - unable to take a higher dose   Outpatient Medications Prior to Visit  Medication Sig Dispense Refill   Accu-Chek Softclix Lancets lancets USE  AS  INSTRUCTED 100 each 3   acetaminophen (TYLENOL) 500 MG tablet Take 500 mg by mouth every 6 (six) hours as needed.     Alcohol Swabs (DROPSAFE ALCOHOL PREP) 70 % PADS USE TO CLEAN INJECTION SITE TO CHECK BLOOD SUGARS DAILY 100 each 3   ALPRAZolam (XANAX) 0.5 MG tablet TAKE 1/2 TO 1 TABLET BY MOUTH THREE TIMES DAILY AS NEEDED FOR ANXIETY.  DO NOT TAKE WITH TRAMADOL. THIS IS A 30 DAY SUPPLY 90 tablet 3   amLODipine (NORVASC) 5 MG tablet TAKE 1 TABLET(5 MG) BY MOUTH DAILY 90 tablet 3   aspirin 81 MG tablet Take 81 mg by mouth daily.     Cholecalciferol (VITAMIN D) 50 MCG (2000 UT) CAPS TAKE 1 CAPSULE BY MOUTH EVERY DAY 100 capsule 3   diclofenac Sodium (VOLTAREN) 1 % GEL Apply 2 g topically 4 (four) times daily. 150 g 1   ezetimibe (ZETIA) 10 MG tablet Take 10 mg by mouth daily.     furosemide (LASIX) 20 MG tablet TAKE 1 TABLET(20 MG) BY MOUTH DAILY AS NEEDED FOR SWELLING 90 tablet 3   glucose blood (ACCU-CHEK GUIDE TEST) test strip Use to check blood sugars TWICE a day. 100 each 12   hydroxypropyl methylcellulose (ISOPTO TEARS) 2.5 % ophthalmic solution Place 1 drop into both eyes 2 (two) times daily.     hyoscyamine (LEVSIN) 0.125 MG tablet TAKE 1 TABLET BY MOUTH EVERY 6 HOURS AS NEEDED FOR UPTO 10 DAYS FOR CRAMPING 360 tablet 0   isosorbide mononitrate (IMDUR) 30 MG 24 hr tablet TAKE 1 TABLET BY MOUTH EVERY DAY 90 tablet 2   pantoprazole (PROTONIX) 40  MG tablet TAKE 1 TABLET BY MOUTH EVERY DAY 90 tablet 1   simvastatin (ZOCOR) 20 MG tablet Take 1 tablet (20 mg total) by mouth at bedtime. 90 tablet 3   warfarin (COUMADIN) 5 MG tablet TAKE 1/2 TO 1 TABLET BY MOUTH DAILY AS DIRECTED BY COUMADIN CLINIC 90 tablet 1   metoprolol succinate (TOPROL-XL) 50 MG 24 hr tablet TAKE 2 tables in the morning and take 1 1/2 tablet in the evening. 112 tablet 3   No facility-administered medications prior to visit.    ROS: Review of Systems  Constitutional:  Negative for activity change, appetite change, chills, fatigue and unexpected weight change.  HENT:  Negative for congestion, mouth sores and sinus pressure.   Eyes:  Negative for visual disturbance.  Respiratory:  Positive for shortness of breath. Negative for cough and chest tightness.   Cardiovascular:  Positive for palpitations.  Gastrointestinal:  Negative for abdominal pain and nausea.  Genitourinary:  Negative for difficulty urinating, frequency and vaginal pain.  Musculoskeletal:  Positive for arthralgias and gait problem. Negative for back pain.  Skin:  Negative for pallor and rash.  Neurological:  Negative for dizziness, tremors, weakness, numbness and headaches.  Psychiatric/Behavioral:  Negative for confusion and sleep disturbance.     Objective:  BP 130/88   Pulse (!) 59   Temp 97.7 F (36.5 C) Comment: Aux  Ht 5\' 6"  (1.676 m)   Wt 192 lb 3.2 oz (87.2 kg)   SpO2 97%   BMI 31.02 kg/m   BP Readings from Last 3 Encounters:  03/24/23 130/88  03/05/23 138/78  01/27/23 130/66    Wt Readings from Last 3 Encounters:  03/24/23 192 lb 3.2 oz (87.2 kg)  03/23/23 190 lb (86.2 kg)  03/05/23 190 lb 9.6 oz (86.5 kg)    Physical Exam Constitutional:      General: She is not in acute distress.    Appearance: She is well-developed. She is obese.  HENT:     Head: Normocephalic.     Right Ear: External ear normal.     Left Ear: External ear normal.     Nose: Nose normal.  Eyes:      General:        Right eye: No discharge.        Left eye: No discharge.     Conjunctiva/sclera: Conjunctivae normal.     Pupils: Pupils are equal, round, and reactive to light.  Neck:     Thyroid: No thyromegaly.     Vascular: No JVD.     Trachea: No tracheal deviation.  Cardiovascular:     Rate and Rhythm: Normal rate. Rhythm irregular.     Heart sounds: Normal heart sounds.     No gallop.  Pulmonary:     Effort: No respiratory distress.     Breath sounds: No stridor. No wheezing.  Abdominal:     General: Bowel sounds are normal. There is no distension.     Palpations: Abdomen is soft. There is no mass.     Tenderness: There is no abdominal tenderness. There is no guarding or rebound.  Musculoskeletal:        General: No tenderness.     Cervical back: Normal range of motion and neck supple. No rigidity.  Lymphadenopathy:     Cervical: No cervical adenopathy.  Skin:    Findings: No erythema or rash.  Neurological:     Mental Status: Mental status is at baseline.     Cranial Nerves: No cranial nerve deficit.     Motor: No abnormal muscle tone.     Coordination: Coordination normal.     Deep Tendon Reflexes: Reflexes normal.  Psychiatric:        Behavior: Behavior normal.        Thought Content: Thought content normal.        Judgment: Judgment normal.     Lab Results  Component Value Date   WBC 5.2 01/14/2023   HGB 12.0 01/14/2023   HCT 38.3 01/14/2023   PLT 200 01/14/2023   GLUCOSE 186 (H) 01/14/2023   CHOL 163 11/27/2022   TRIG 149.0 11/27/2022   HDL 57.30 11/27/2022   LDLDIRECT 105.5 11/26/2010   LDLCALC 76 11/27/2022   ALT 17 01/14/2023   AST 20 01/14/2023   NA 135 01/14/2023   K 3.7 01/14/2023   CL 102 01/14/2023   CREATININE 1.11 (H) 01/14/2023   BUN 14 01/14/2023   CO2 25 01/14/2023   TSH 1.660 10/22/2022   INR 2.9 03/10/2023   HGBA1C 7.2 (H) 09/16/2022    No results found.  Assessment & Plan:   Problem List Items Addressed This Visit      Anxiety    Cont on Xanax prn  Potential benefits of a long term benzodiazepines  use as well as potential risks  and complications were explained to the patient and were aknowledged. Not to take w/Tramadol      Essential hypertension    Cont on Isosorbide, Amlodipine Cont on metoprolol succinate 75 mg in the morning and metoprolol succinate 75 mg in the evening.  Continue to monitor HR/symptoms. She is high risk for recurrent atrial fibrillation given high incidence of atrial ectopy.  Continue warfarin.       Relevant Medications   metoprolol succinate (TOPROL-XL) 50 MG 24 hr tablet   CAD (coronary artery disease)     Cont on Simvastatin, Isosorbide, Amlodipine Cont on metoprolol succinate 100 mg in the morning and metoprolol succinate 75 mg in the evening.  Continue to monitor HR/symptoms. She is high risk for recurrent atrial fibrillation given high incidence of atrial ectopy.  Continue warfarin.       Relevant Medications   metoprolol succinate (TOPROL-XL) 50 MG 24 hr tablet   PSVT (paroxysmal supraventricular tachycardia) (HCC)    Cont on metoprolol succinate 75 mg in the morning and metoprolol succinate 75 mg in the evening.  Continue to monitor HR/symptoms. She is high risk for recurrent atrial fibrillation given high incidence of atrial ectopy.  Continue warfarin.       Relevant Medications   metoprolol succinate (TOPROL-XL) 50 MG 24 hr tablet   PAF (paroxysmal atrial fibrillation) (HCC) - Primary    Cont on metoprolol succinate 75 mg in the morning and metoprolol succinate 75 mg in the evening.  Continue to monitor HR/symptoms. She is high risk for recurrent atrial fibrillation given high incidence of atrial ectopy.  Continue warfarin.       Relevant Medications   metoprolol succinate (TOPROL-XL) 50 MG 24 hr tablet   Other Relevant Orders   Comprehensive metabolic panel      Meds ordered this encounter  Medications   metoprolol succinate (TOPROL-XL) 50 MG 24 hr tablet     Sig: TAKE 1 1/2 tables in the morning and take 1 1/2 tablet in the evening.    Please hold rx for pt.      Follow-up: Return in about 6 months (around 09/22/2023) for a follow-up visit.  Sonda Primes, MD

## 2023-03-24 NOTE — Assessment & Plan Note (Signed)
Cont on Xanax prn  Potential benefits of a long term benzodiazepines  use as well as potential risks  and complications were explained to the patient and were aknowledged. Not to take w/Tramadol 

## 2023-03-24 NOTE — Assessment & Plan Note (Addendum)
Cont on Isosorbide, Amlodipine Cont on metoprolol succinate 75 mg in the morning and metoprolol succinate 75 mg in the evening.  Continue to monitor HR/symptoms. She is high risk for recurrent atrial fibrillation given high incidence of atrial ectopy.  Continue warfarin.

## 2023-03-24 NOTE — Assessment & Plan Note (Signed)
Cont on Simvastatin, Isosorbide, Amlodipine Cont on metoprolol succinate 100 mg in the morning and metoprolol succinate 75 mg in the evening.  Continue to monitor HR/symptoms. She is high risk for recurrent atrial fibrillation given high incidence of atrial ectopy.  Continue warfarin.

## 2023-03-25 ENCOUNTER — Telehealth: Payer: Self-pay | Admitting: *Deleted

## 2023-03-25 NOTE — Progress Notes (Signed)
  Care Coordination Note  03/25/2023 Name: [name] MRN: [id number] DOB: 12-07-1934  [name] is a 88 y.o. year old female who is a primary care patient of Plotnikov, Georgina Quint, MD and is actively engaged with the care management team. I reached out to [name] by phone today to assist with re-scheduling a follow up visit with the RN Case Manager  Follow up plan: Unsuccessful telephone outreach attempt made. A HIPAA compliant phone message was left for the patient providing contact information and requesting a return call.   Burman Nieves, CCMA Care Coordination Care Guide Direct Dial: [phone]

## 2023-03-31 NOTE — Telephone Encounter (Signed)
  Care Coordination Note  03/31/2023 Name: [name] MRN: [id number] DOB: 20-Dec-1934  [name] is a 88 y.o. year old female who is a primary care patient of Plotnikov, Georgina Quint, MD and is actively engaged with the care management team. I reached out to [name] by phone today to assist with re-scheduling a follow up visit with the RN Case Manager  Follow up plan: We have been unable to make contact with the patient for follow up.   Burman Nieves, CCMA Care Coordination Care Guide Direct Dial: [phone]

## 2023-04-01 ENCOUNTER — Ambulatory Visit
Admission: RE | Admit: 2023-04-01 | Discharge: 2023-04-01 | Disposition: A | Discharge status: Home / Self Care | Payer: Medicare HMO | Source: Ambulatory Visit | Attending: Internal Medicine | Admitting: Internal Medicine

## 2023-04-01 DIAGNOSIS — Z1231 Encounter for screening mammogram for malignant neoplasm of breast: Secondary | ICD-10-CM | POA: Diagnosis not present

## 2023-04-01 DIAGNOSIS — Z0000 Encounter for general adult medical examination without abnormal findings: Secondary | ICD-10-CM

## 2023-04-01 NOTE — Patient Outreach (Signed)
  Care Coordination   Case closure  Visit Note   04/01/2023 Name: [name] MRN: [id number] DOB: December 12, 1934  [name] is a 88 y.o. year old female who sees Plotnikov, Georgina Quint, MD for primary care. No patient communication was made during this encounter.  Per care guide-unsuccessful outreach attempts to reschedule patient. Goals closed.  Goals Addressed             This Visit's Progress    COMPLETED: Asssit with health management       Interventions Today    Flowsheet Row Most Recent Value  General Interventions   General Interventions Discussed/Reviewed General Interventions Reviewed  [unsuccessful outreach attempts to reschedule patient-goals closed.]           SDOH assessments and interventions completed: No    Care Coordination Interventions: No    Follow up plan: No further intervention required.   Encounter Outcome:  Patient Visit Completed   Kathyrn Sheriff, RN, MSN, BSN, CCM Care Management Coordinator [phone]

## 2023-04-02 ENCOUNTER — Other Ambulatory Visit: Payer: Self-pay

## 2023-04-02 MED ORDER — ISOSORBIDE MONONITRATE ER 30 MG PO TB24: 30.0000 mg | ORAL_TABLET | Freq: Every day | ORAL | 2 refills | Status: DC

## 2023-04-02 MED ORDER — AMLODIPINE BESYLATE 5 MG PO TABS: 5.0000 mg | ORAL_TABLET | Freq: Every day | ORAL | 3 refills | Status: DC

## 2023-04-02 NOTE — Telephone Encounter (Signed)
Pt has stated she is needing a rx refill for   ALPRAZolam (XANAX) 0.5 MG tablet

## 2023-04-06 ENCOUNTER — Other Ambulatory Visit: Payer: Self-pay | Admitting: Internal Medicine

## 2023-04-06 NOTE — Telephone Encounter (Signed)
Copied from CRM [id number]. Topic: Clinical - Medication Refill >> Apr 06, 2023  2:57 PM Kathryne Eriksson wrote: Most Recent Primary Care Visit:  Provider: Tresa Garter  Department: [facility]  Visit Type: OFFICE VISIT  Date: 03/24/2023  Medication: ***  Has the patient contacted their pharmacy?  (Agent: If no, request that the patient contact the pharmacy for the refill. If patient does not wish to contact the pharmacy document the reason why and proceed with request.) (Agent: If yes, when and what did the pharmacy advise?)  Is this the correct pharmacy for this prescription?  If no, delete pharmacy and type the correct one.  This is the patient's preferred pharmacy:  [facility] [address] [place] - [address] [place] [address] Phone: [phone] Fax: [phone]  [facility] - [place], [place] - [address] [place] [address] Phone: [phone] Fax: [phone]   Has the prescription been filled recently?   Is the patient out of the medication?   Has the patient been seen for an appointment in the last year OR does the patient have an upcoming appointment?   Can we respond through MyChart?   Agent: Please be advised that Rx refills may take up to 3 business days. We ask that you follow-up with your pharmacy.

## 2023-04-06 NOTE — Telephone Encounter (Signed)
Copied from CRM [id number]. Topic: Clinical - Medication Question >> Apr 06, 2023  3:02 PM Kathryne Eriksson wrote: Medication Refill: ALPRAZolam Prudy Feeler) 0.5 MG tablet >> Apr 06, 2023  3:04 PM Kathryne Eriksson wrote: Patient stated she reached out to the pharmacy, because she doesn't have anymore refills she was instructed to reach out to her provider in regards to the medication.

## 2023-04-21 ENCOUNTER — Ambulatory Visit: Payer: Medicare HMO

## 2023-04-22 ENCOUNTER — Telehealth: Payer: Self-pay | Admitting: *Deleted

## 2023-04-22 NOTE — Progress Notes (Signed)
 Complex Care Management Care Guide Note  04/22/2023 Name: [name] MRN: [id number] DOB: 11/25/1934  [name] is a 89 y.o. year old female who is a primary care patient of Plotnikov, Aleksei V, MD and is actively engaged with the care management team. I reached out to Clancy CHRISTELLA Shams by phone today to assist with re-scheduling  with the RN Case Manager.  Follow up plan: Telephone appointment with complex care management team member scheduled for:  04/29/2023  Thedford Franks, [facility] Care Coordination Care Guide Direct Dial: [phone]

## 2023-04-27 ENCOUNTER — Ambulatory Visit: Payer: Medicare HMO | Admitting: Nurse Practitioner

## 2023-04-28 ENCOUNTER — Ambulatory Visit: Payer: Medicare HMO | Attending: Cardiology | Admitting: *Deleted

## 2023-04-28 DIAGNOSIS — I480 Paroxysmal atrial fibrillation: Secondary | ICD-10-CM | POA: Diagnosis not present

## 2023-04-28 DIAGNOSIS — Z7901 Long term (current) use of anticoagulants: Secondary | ICD-10-CM

## 2023-04-28 LAB — POCT INR: INR: 2.5 (ref 2.0–3.0)

## 2023-04-28 NOTE — Patient Instructions (Signed)
 Description   Continue taking Warfarin 1 tablet daily. Repeat INR in 6 weeks.  Call [phone] with an warfarin related questions. *Consistent amount of greens every week*

## 2023-04-29 ENCOUNTER — Ambulatory Visit: Payer: Self-pay

## 2023-04-29 NOTE — Patient Instructions (Signed)
 Visit Information  Thank you for taking time to visit with me today. Please don't hesitate to contact me if I can be of assistance to you.   Following are the goals we discussed today:  Monitor Blood Pressure and pulse and record readings as recommended by your provider. Take readings with you to your upcoming appointment with cardiologist. Contact your provider if questions or concerns Continue to take medications as prescribed. Continue to attend provider visits as scheduled Continue to eat healthy, lean meats, vegetables, fruits, avoid saturated and transfats Contact provider with health questions or concerns as needed Check blood sugar as recommended and notify provider if questions or concerns   Our next appointment is by telephone on 06/01/23 at 10:30 am  Please call the care guide team at [phone] if you need to cancel or reschedule your appointment.   If you are experiencing a Mental Health or Behavioral Health Crisis or need someone to talk to, please call the Suicide and Crisis Lifeline: 988 call the [place]  National Suicide Prevention Lifeline: 438-570-3145 or TTY: 802 602 4522 TTY 782 092 0834) to talk to a trained counselor   Lindi Revering, RN, MSN, BSN, CCM VBCI RN Care Manager [phone]

## 2023-04-29 NOTE — Patient Outreach (Signed)
  Care Coordination   Initial Visit Note   04/29/2023 Name: [name] MRN: [id number] DOB: 1934/05/28  [name] is a 89 y.o. year old female who sees Plotnikov, Aleksei V, MD for primary care. I spoke with  [name] by phone today.  What matters to the patients health and wellness today?  [name] reports she is doing well. She states she is checking her blood pressure/HR about twice a week, but does not record. She is unable to recall latest readings. She states she continues to check blood sugar, although she is not taking medications for diabetes adding she wants to make sure it does not go too high. She denies BS ever being low. She states she tried to increase metoprolol  to 100 mg per cardiology instruction 03/05/23, however she added, "it made me woozy". She states she is taking 1.5 tablets twice a day as per medication list in chart) and is denies any difficulty on this dose. Last visit with PCP 03/24/23. Patient with upcoming visit with cardiologist on 05/26/23. [name] confirms she has transportation. She denies any questions or concerns at this time.  Goals Addressed             This Visit's Progress    Assist with education/management of health condition       Interventions Today    Flowsheet Row Most Recent Value  Chronic Disease   Chronic disease during today's visit Diabetes, Hypertension (HTN), Atrial Fibrillation (AFib)  General Interventions   General Interventions Discussed/Reviewed General Interventions Discussed, Doctor Visits  [Evaluation of current treatment plan for health condition and patient's adherence to plan. assessed patient management and monitoring of heart conditions. assessed if any equipement needs.]  Doctor Visits Discussed/Reviewed Doctor Visits Discussed, PCP, Specialist  PCP/Specialist Visits Compliance with follow-up visit  [reviewed upcoming scheduled appointments]  Education Interventions   Education Provided Provided  Education  [reviewed patient instructions with patient per office PCP office visit 03/24/23. discussed the importance of checkingand keeping track of blood pressure and HR, in addition to symptoms]  Provided Verbal Education On Medication, Blood Sugar Monitoring, When to see the doctor, Other  [advised to continue to take medications as prescribed by providers, attend provider visits as scheduled, eat healthy, contact providers with any health questions or concerns.]  Pharmacy Interventions   Pharmacy Dicussed/Reviewed Pharmacy Topics Discussed  [medications reviewed with patient]  Safety Interventions   Safety Discussed/Reviewed Safety Discussed            SDOH assessments and interventions completed:  Yes  SDOH Interventions Today    Flowsheet Row Most Recent Value  SDOH Interventions   Food Insecurity Interventions Intervention Not Indicated  Housing Interventions Intervention Not Indicated  Transportation Interventions Intervention Not Indicated  Utilities Interventions Intervention Not Indicated     Care Coordination Interventions:  Yes, provided   Follow up plan: Follow up call scheduled for 06/01/23    Encounter Outcome:  Patient Visit Completed   Lindi Revering, RN, MSN, BSN, CCM RN Case Manager [phone]

## 2023-05-14 ENCOUNTER — Emergency Department (HOSPITAL_COMMUNITY): Payer: Medicare HMO

## 2023-05-14 ENCOUNTER — Inpatient Hospital Stay (HOSPITAL_COMMUNITY)
Admission: EM | Admit: 2023-05-14 | Discharge: 2023-05-17 | DRG: 641 | Disposition: A | Discharge status: Home Health | Payer: Medicare HMO | Attending: Internal Medicine | Admitting: Internal Medicine

## 2023-05-14 ENCOUNTER — Other Ambulatory Visit: Payer: Self-pay

## 2023-05-14 ENCOUNTER — Encounter (HOSPITAL_COMMUNITY): Payer: Self-pay | Admitting: Emergency Medicine

## 2023-05-14 DIAGNOSIS — F419 Anxiety disorder, unspecified: Secondary | ICD-10-CM | POA: Diagnosis present

## 2023-05-14 DIAGNOSIS — I480 Paroxysmal atrial fibrillation: Secondary | ICD-10-CM | POA: Diagnosis present

## 2023-05-14 DIAGNOSIS — E871 Hypo-osmolality and hyponatremia: Principal | ICD-10-CM | POA: Diagnosis present

## 2023-05-14 DIAGNOSIS — R1111 Vomiting without nausea: Secondary | ICD-10-CM | POA: Diagnosis present

## 2023-05-14 DIAGNOSIS — N2889 Other specified disorders of kidney and ureter: Secondary | ICD-10-CM | POA: Diagnosis not present

## 2023-05-14 DIAGNOSIS — N281 Cyst of kidney, acquired: Secondary | ICD-10-CM | POA: Diagnosis not present

## 2023-05-14 DIAGNOSIS — E785 Hyperlipidemia, unspecified: Secondary | ICD-10-CM | POA: Diagnosis present

## 2023-05-14 DIAGNOSIS — Z8672 Personal history of thrombophlebitis: Secondary | ICD-10-CM

## 2023-05-14 DIAGNOSIS — Z832 Family history of diseases of the blood and blood-forming organs and certain disorders involving the immune mechanism: Secondary | ICD-10-CM | POA: Diagnosis not present

## 2023-05-14 DIAGNOSIS — K219 Gastro-esophageal reflux disease without esophagitis: Secondary | ICD-10-CM | POA: Diagnosis present

## 2023-05-14 DIAGNOSIS — R0789 Other chest pain: Secondary | ICD-10-CM | POA: Diagnosis not present

## 2023-05-14 DIAGNOSIS — Z885 Allergy status to narcotic agent status: Secondary | ICD-10-CM | POA: Diagnosis not present

## 2023-05-14 DIAGNOSIS — I10 Essential (primary) hypertension: Secondary | ICD-10-CM | POA: Diagnosis present

## 2023-05-14 DIAGNOSIS — Z823 Family history of stroke: Secondary | ICD-10-CM | POA: Diagnosis not present

## 2023-05-14 DIAGNOSIS — R109 Unspecified abdominal pain: Secondary | ICD-10-CM | POA: Diagnosis present

## 2023-05-14 DIAGNOSIS — M810 Age-related osteoporosis without current pathological fracture: Secondary | ICD-10-CM | POA: Diagnosis present

## 2023-05-14 DIAGNOSIS — I2510 Atherosclerotic heart disease of native coronary artery without angina pectoris: Secondary | ICD-10-CM | POA: Diagnosis present

## 2023-05-14 DIAGNOSIS — Z888 Allergy status to other drugs, medicaments and biological substances status: Secondary | ICD-10-CM

## 2023-05-14 DIAGNOSIS — Z7982 Long term (current) use of aspirin: Secondary | ICD-10-CM

## 2023-05-14 DIAGNOSIS — F32A Depression, unspecified: Secondary | ICD-10-CM | POA: Diagnosis not present

## 2023-05-14 DIAGNOSIS — Z881 Allergy status to other antibiotic agents status: Secondary | ICD-10-CM | POA: Diagnosis not present

## 2023-05-14 DIAGNOSIS — Z1152 Encounter for screening for COVID-19: Secondary | ICD-10-CM | POA: Diagnosis not present

## 2023-05-14 DIAGNOSIS — Z79899 Other long term (current) drug therapy: Secondary | ICD-10-CM | POA: Diagnosis not present

## 2023-05-14 DIAGNOSIS — R52 Pain, unspecified: Secondary | ICD-10-CM | POA: Diagnosis not present

## 2023-05-14 DIAGNOSIS — K828 Other specified diseases of gallbladder: Secondary | ICD-10-CM | POA: Diagnosis not present

## 2023-05-14 DIAGNOSIS — E861 Hypovolemia: Secondary | ICD-10-CM | POA: Diagnosis not present

## 2023-05-14 DIAGNOSIS — Z91048 Other nonmedicinal substance allergy status: Secondary | ICD-10-CM

## 2023-05-14 DIAGNOSIS — R051 Acute cough: Secondary | ICD-10-CM | POA: Diagnosis present

## 2023-05-14 DIAGNOSIS — Z7901 Long term (current) use of anticoagulants: Secondary | ICD-10-CM | POA: Diagnosis not present

## 2023-05-14 DIAGNOSIS — K838 Other specified diseases of biliary tract: Secondary | ICD-10-CM | POA: Diagnosis not present

## 2023-05-14 DIAGNOSIS — R079 Chest pain, unspecified: Secondary | ICD-10-CM | POA: Diagnosis not present

## 2023-05-14 DIAGNOSIS — K8689 Other specified diseases of pancreas: Secondary | ICD-10-CM | POA: Diagnosis not present

## 2023-05-14 DIAGNOSIS — K8020 Calculus of gallbladder without cholecystitis without obstruction: Secondary | ICD-10-CM | POA: Diagnosis not present

## 2023-05-14 LAB — COMPREHENSIVE METABOLIC PANEL
ALT: 18 U/L (ref 0–44)
AST: 29 U/L (ref 15–41)
Albumin: 4.2 g/dL (ref 3.5–5.0)
Alkaline Phosphatase: 55 U/L (ref 38–126)
Anion gap: 13 (ref 5–15)
BUN: 13 mg/dL (ref 8–23)
CO2: 19 mmol/L — ABNORMAL LOW (ref 22–32)
Calcium: 8.6 mg/dL — ABNORMAL LOW (ref 8.9–10.3)
Chloride: 94 mmol/L — ABNORMAL LOW (ref 98–111)
Creatinine, Ser: 0.78 mg/dL (ref 0.44–1.00)
GFR, Estimated: >60 mL/min (ref >60)
Glucose, Bld: 247 mg/dL — ABNORMAL HIGH (ref 70–99)
Potassium: 3.6 mmol/L (ref 3.5–5.1)
Sodium: 126 mmol/L — ABNORMAL LOW (ref 135–145)
Total Bilirubin: 1.1 mg/dL (ref 0.0–1.2)
Total Protein: 7.7 g/dL (ref 6.5–8.1)

## 2023-05-14 LAB — CBC WITH DIFFERENTIAL/PLATELET
Abs Immature Granulocytes: 0.05 10*3/uL (ref 0.00–0.07)
Basophils Absolute: 0 10*3/uL (ref 0.0–0.1)
Basophils Relative: 0 %
Eosinophils Absolute: 0 10*3/uL (ref 0.0–0.5)
Eosinophils Relative: 0 %
HCT: 40.1 % (ref 36.0–46.0)
Hemoglobin: 13.1 g/dL (ref 12.0–15.0)
Immature Granulocytes: 0 %
Lymphocytes Relative: 6 %
Lymphs Abs: 0.7 10*3/uL (ref 0.7–4.0)
MCH: 28.8 pg (ref 26.0–34.0)
MCHC: 32.7 g/dL (ref 30.0–36.0)
MCV: 88.1 fL (ref 80.0–100.0)
Monocytes Absolute: 0.8 10*3/uL (ref 0.1–1.0)
Monocytes Relative: 7 %
Neutro Abs: 10.5 10*3/uL — ABNORMAL HIGH (ref 1.7–7.7)
Neutrophils Relative %: 87 %
Platelets: 213 10*3/uL (ref 150–400)
RBC: 4.55 MIL/uL (ref 3.87–5.11)
RDW: 12.4 % (ref 11.5–15.5)
WBC: 12 10*3/uL — ABNORMAL HIGH (ref 4.0–10.5)
nRBC: 0 % (ref 0.0–0.2)

## 2023-05-14 LAB — PROTIME-INR
INR: 2.4 — ABNORMAL HIGH (ref 0.8–1.2)
Prothrombin Time: 26.7 s — ABNORMAL HIGH (ref 11.4–15.2)

## 2023-05-14 LAB — LIPASE, BLOOD: Lipase: 23 U/L (ref 11–51)

## 2023-05-14 LAB — TROPONIN I (HIGH SENSITIVITY): Troponin I (High Sensitivity): 8 ng/L (ref <18)

## 2023-05-14 MED ORDER — SODIUM CHLORIDE 0.9 % IV BOLUS
1000.0000 mL | Freq: Once | INTRAVENOUS | Status: AC
  Administered 2023-05-14: 1000 mL via INTRAVENOUS

## 2023-05-14 MED ORDER — ONDANSETRON HCL 4 MG/2ML IJ SOLN
4.0000 mg | Freq: Once | INTRAMUSCULAR | Status: AC
  Administered 2023-05-14: 4 mg via INTRAVENOUS
  Filled 2023-05-14: qty 2

## 2023-05-14 NOTE — ED Provider Triage Note (Signed)
Emergency Medicine Provider Triage Evaluation Note  [name] , a 89 y.o. female  was evaluated in triage.  Pt complains of chest pain and a "stomach bug" that started today.  Reports a couple episodes of nausea and vomiting.  Also reports mild shortness of breath.  Denies any fever or chills at home.  Denies any radiation of pain to the back.  Review of Systems  Positive: As above Negative: As above  Physical Exam  BP 118/88 (BP Location: Left Arm)   Pulse 65   Temp 98.1 F (36.7 C) (Oral)   Resp 20   SpO2 99%  Gen:   Awake, no distress, rocking back and forth Resp:  Normal effort  MSK:   Moves extremities without difficulty    Medical Decision Making  Medically screening exam initiated at 7:54 PM.  Appropriate orders placed.  [name] was informed that the remainder of the evaluation will be completed by another provider, this initial triage assessment does not replace that evaluation, and the importance of remaining in the ED until their evaluation is complete.     Arabella Merles, PA-C 05/14/23 1955

## 2023-05-14 NOTE — ED Triage Notes (Signed)
Pt in with reported chest pain and "stomach virus" that began today. Also reporting sob and upper abdominal pain. States she has had 3 episodes of n/v/d today

## 2023-05-14 NOTE — ED Provider Notes (Signed)
No Name EMERGENCY DEPARTMENT AT [facility] Provider Note   CSN: [id number] Arrival date & time: 05/14/23  1922     History {Add pertinent medical, surgical, social history, OB history to HPI:1} Chief Complaint  Patient presents with   Chest Pain    [name] is a 89 y.o. female.  Presents to the emergency department for evaluation of multiple problems.  Patient reports that she has been feeling sick all day today.  Has had a dry cough.  She has had nausea, vomiting, diarrhea.  No hematemesis or rectal bleeding.  Patient with upper abdominal discomfort as well as chest discomfort.       Home Medications Prior to Admission medications   Medication Sig Start Date End Date Taking? Authorizing Provider  Accu-Chek Softclix Lancets lancets USE  AS  INSTRUCTED 12/09/22   Plotnikov, Georgina Quint, MD  acetaminophen (TYLENOL) 500 MG tablet Take 500 mg by mouth every 6 (six) hours as needed.    [provider]  Alcohol Swabs (DROPSAFE ALCOHOL PREP) 70 % PADS USE TO CLEAN INJECTION SITE TO CHECK BLOOD SUGARS DAILY 11/10/22   Plotnikov, Georgina Quint, MD  ALPRAZolam (XANAX) 0.5 MG tablet TAKE 1/2 TO 1 TABLET BY MOUTH THREE TIMES DAILY AS NEEDED FOR ANXIETY. DO NOT TAKE WITH TRAMADOL. THIS IS A 30 DAY SUPPLY 04/07/23   Plotnikov, Georgina Quint, MD  amLODipine (NORVASC) 5 MG tablet Take 1 tablet (5 mg total) by mouth daily. 04/02/23   Plotnikov, Georgina Quint, MD  aspirin 81 MG tablet Take 81 mg by mouth daily.    [provider]  Cholecalciferol (VITAMIN D) 50 MCG (2000 UT) CAPS TAKE 1 CAPSULE BY MOUTH EVERY DAY 12/26/22   Plotnikov, Georgina Quint, MD  diclofenac Sodium (VOLTAREN) 1 % GEL Apply 2 g topically 4 (four) times daily. 11/01/19   Cristie Hem, PA-C  ezetimibe (ZETIA) 10 MG tablet Take 10 mg by mouth daily. Patient not taking: Reported on 04/29/2023 09/29/22   [provider]  furosemide (LASIX) 20 MG tablet TAKE 1 TABLET(20 MG) BY MOUTH DAILY AS NEEDED FOR  SWELLING 12/06/21   Hilty, [name], MD  glucose blood (ACCU-CHEK GUIDE TEST) test strip Use to check blood sugars TWICE a day. 03/04/23   Plotnikov, Georgina Quint, MD  hydroxypropyl methylcellulose (ISOPTO TEARS) 2.5 % ophthalmic solution Place 1 drop into both eyes 2 (two) times daily.    [provider]  hyoscyamine (LEVSIN) 0.125 MG tablet TAKE 1 TABLET BY MOUTH EVERY 6 HOURS AS NEEDED FOR UPTO 10 DAYS FOR CRAMPING 04/24/22   Plotnikov, Georgina Quint, MD  isosorbide mononitrate (IMDUR) 30 MG 24 hr tablet Take 1 tablet (30 mg total) by mouth daily. 04/02/23   Plotnikov, Georgina Quint, MD  metoprolol succinate (TOPROL-XL) 50 MG 24 hr tablet TAKE 1 1/2 tables in the morning and take 1 1/2 tablet in the evening. 03/24/23   Plotnikov, Georgina Quint, MD  pantoprazole (PROTONIX) 40 MG tablet TAKE 1 TABLET BY MOUTH EVERY DAY 12/08/22   Hilty, [name], MD  simvastatin (ZOCOR) 20 MG tablet Take 1 tablet (20 mg total) by mouth at bedtime. 10/22/22   Joylene Grapes, NP  warfarin (COUMADIN) 5 MG tablet TAKE 1/2 TO 1 TABLET BY MOUTH DAILY AS DIRECTED BY COUMADIN CLINIC 03/03/23   Hilty, [name], MD      Allergies    Acetaminophen-codeine, Clarithromycin, Fosamax [alendronate sodium], Guaifenesin er, Metformin, Prandin [repaglinide], and Tape    Review of Systems  Review of Systems  Physical Exam Updated Vital Signs BP (!) 162/87   Pulse 71   Temp 98.2 F (36.8 C) (Oral)   Resp (!) 23   Wt 87.2 kg   SpO2 98%   BMI 31.03 kg/m  Physical Exam Vitals and nursing note reviewed.  Constitutional:      General: She is not in acute distress.    Appearance: She is well-developed.  HENT:     Head: Normocephalic and atraumatic.     Mouth/Throat:     Mouth: Mucous membranes are moist.  Eyes:     General: Vision grossly intact. Gaze aligned appropriately.     Extraocular Movements: Extraocular movements intact.     Conjunctiva/sclera: Conjunctivae normal.  Cardiovascular:     Rate and Rhythm: Normal rate  and regular rhythm.     Pulses: Normal pulses.     Heart sounds: Normal heart sounds, S1 normal and S2 normal. No murmur heard.    No friction rub. No gallop.  Pulmonary:     Effort: Pulmonary effort is normal. No respiratory distress.     Breath sounds: Normal breath sounds.  Abdominal:     General: Bowel sounds are normal.     Palpations: Abdomen is soft.     Tenderness: There is abdominal tenderness in the right upper quadrant, epigastric area and left upper quadrant. There is no guarding or rebound.     Hernia: No hernia is present.  Musculoskeletal:        General: No swelling.     Cervical back: Full passive range of motion without pain, normal range of motion and neck supple. No spinous process tenderness or muscular tenderness. Normal range of motion.     Right lower leg: No edema.     Left lower leg: No edema.  Skin:    General: Skin is warm and dry.     Capillary Refill: Capillary refill takes less than 2 seconds.     Findings: No ecchymosis, erythema, rash or wound.  Neurological:     General: No focal deficit present.     Mental Status: She is alert and oriented to person, place, and time.     GCS: GCS eye subscore is 4. GCS verbal subscore is 5. GCS motor subscore is 6.     Cranial Nerves: Cranial nerves 2-12 are intact.     Sensory: Sensation is intact.     Motor: Motor function is intact.     Coordination: Coordination is intact.  Psychiatric:        Attention and Perception: Attention normal.        Mood and Affect: Mood normal.        Speech: Speech normal.        Behavior: Behavior normal.     ED Results / Procedures / Treatments   Labs (all labs ordered are listed, but only abnormal results are displayed) Labs Reviewed  CBC WITH DIFFERENTIAL/PLATELET - Abnormal; Notable for the following components:      Result Value   WBC 12.0 (*)    Neutro Abs 10.5 (*)    All other components within normal limits  COMPREHENSIVE METABOLIC PANEL - Abnormal; Notable for  the following components:   Sodium 126 (*)    Chloride 94 (*)    CO2 19 (*)    Glucose, Bld 247 (*)    Calcium 8.6 (*)    All other components within normal limits  RESP PANEL BY RT-PCR (RSV, FLU A&B, COVID)  RVPGX2  LIPASE, BLOOD  TROPONIN I (HIGH SENSITIVITY)  TROPONIN I (HIGH SENSITIVITY)    EKG EKG Interpretation Date/Time:  Thursday May 14 2023 22:41:49 EST Ventricular Rate:  69 PR Interval:  234 QRS Duration:  131 QT Interval:  420 QTC Calculation: 450 R Axis:   -40  Text Interpretation: Sinus rhythm Supraventricular bigeminy Prolonged PR interval Left bundle branch block No acute changes Confirmed by Gilda Crease [id number]) on 05/14/2023 11:07:58 PM  Radiology DG Chest 2 View Result Date: 05/14/2023 CLINICAL DATA:  Chest pain EXAM: CHEST - 2 VIEW COMPARISON:  10/11/2022 FINDINGS: Cardiac shadow is stable. Lungs are well aerated bilaterally. No focal infiltrate or effusion is seen. No bony abnormality is noted. IMPRESSION: No acute abnormality noted. Electronically Signed   By: Alcide Clever M.D.   On: 05/14/2023 21:25    Procedures Procedures  {Document cardiac monitor, telemetry assessment procedure when appropriate:1}  Medications Ordered in ED Medications - No data to display  ED Course/ Medical Decision Making/ A&P   {   Click here for ABCD2, HEART and other calculatorsREFRESH Note before signing :1}                              Medical Decision Making Amount and/or Complexity of Data Reviewed Labs: ordered. Radiology: ordered.  Risk Prescription drug management.   Differential Diagnosis considered includes, but not limited to: Cholelithiasis; cholecystitis; cholangitis; bowel obstruction; esophagitis; gastritis; peptic ulcer disease; pancreatitis; cardiac.  Presents with multiple complaints.  Patient with cough, congestion, nausea, vomiting, diarrhea.  Patient reports discomfort in the upper abdomen and chest area.  No shortness of  breath.  Cardiac evaluation is unremarkable.  No concern for acute coronary syndrome.    Patient with slight leukocytosis.  Chemistries revealed hyponatremia - Na 126.  Looking through records, she has had periodic hyponatremia in the past.  Patient given a liter of fluid and her chemistries rechecked.  Sodium still 126, creatinine has actually gone up slightly.  Upon recheck, patient still complaining of some slight abdominal discomfort.  She went CT scan which raise concern for cholecystitis.  Right upper quadrant ultrasound was performed.  Does confirm sludge and gallstones but no evidence of acute cholecystitis.  {Document critical care time when appropriate:1} {Document review of labs and clinical decision tools ie heart score, Chads2Vasc2 etc:1}  {Document your independent review of radiology images, and any outside records:1} {Document your discussion with family members, caretakers, and with consultants:1} {Document social determinants of health affecting pt's care:1} {Document your decision making why or why not admission, treatments were needed:1} Final Clinical Impression(s) / ED Diagnoses Final diagnoses:  None    Rx / DC Orders ED Discharge Orders     None

## 2023-05-15 ENCOUNTER — Emergency Department (HOSPITAL_COMMUNITY): Payer: Medicare HMO

## 2023-05-15 DIAGNOSIS — Z7982 Long term (current) use of aspirin: Secondary | ICD-10-CM | POA: Diagnosis not present

## 2023-05-15 DIAGNOSIS — R52 Pain, unspecified: Secondary | ICD-10-CM | POA: Diagnosis present

## 2023-05-15 DIAGNOSIS — E861 Hypovolemia: Secondary | ICD-10-CM | POA: Diagnosis present

## 2023-05-15 DIAGNOSIS — R051 Acute cough: Secondary | ICD-10-CM | POA: Diagnosis present

## 2023-05-15 DIAGNOSIS — Z7901 Long term (current) use of anticoagulants: Secondary | ICD-10-CM | POA: Diagnosis not present

## 2023-05-15 DIAGNOSIS — Z8672 Personal history of thrombophlebitis: Secondary | ICD-10-CM | POA: Diagnosis not present

## 2023-05-15 DIAGNOSIS — E871 Hypo-osmolality and hyponatremia: Secondary | ICD-10-CM | POA: Diagnosis present

## 2023-05-15 DIAGNOSIS — K8020 Calculus of gallbladder without cholecystitis without obstruction: Secondary | ICD-10-CM | POA: Diagnosis not present

## 2023-05-15 DIAGNOSIS — F32A Depression, unspecified: Secondary | ICD-10-CM | POA: Diagnosis present

## 2023-05-15 DIAGNOSIS — R109 Unspecified abdominal pain: Secondary | ICD-10-CM | POA: Diagnosis present

## 2023-05-15 DIAGNOSIS — K838 Other specified diseases of biliary tract: Secondary | ICD-10-CM | POA: Diagnosis not present

## 2023-05-15 DIAGNOSIS — Z885 Allergy status to narcotic agent status: Secondary | ICD-10-CM | POA: Diagnosis not present

## 2023-05-15 DIAGNOSIS — I2510 Atherosclerotic heart disease of native coronary artery without angina pectoris: Secondary | ICD-10-CM | POA: Diagnosis present

## 2023-05-15 DIAGNOSIS — N2889 Other specified disorders of kidney and ureter: Secondary | ICD-10-CM | POA: Diagnosis not present

## 2023-05-15 DIAGNOSIS — I480 Paroxysmal atrial fibrillation: Secondary | ICD-10-CM | POA: Diagnosis present

## 2023-05-15 DIAGNOSIS — Z832 Family history of diseases of the blood and blood-forming organs and certain disorders involving the immune mechanism: Secondary | ICD-10-CM | POA: Diagnosis not present

## 2023-05-15 DIAGNOSIS — K8689 Other specified diseases of pancreas: Secondary | ICD-10-CM | POA: Diagnosis not present

## 2023-05-15 DIAGNOSIS — N281 Cyst of kidney, acquired: Secondary | ICD-10-CM | POA: Diagnosis not present

## 2023-05-15 DIAGNOSIS — Z823 Family history of stroke: Secondary | ICD-10-CM | POA: Diagnosis not present

## 2023-05-15 DIAGNOSIS — Z881 Allergy status to other antibiotic agents status: Secondary | ICD-10-CM | POA: Diagnosis not present

## 2023-05-15 DIAGNOSIS — M810 Age-related osteoporosis without current pathological fracture: Secondary | ICD-10-CM | POA: Diagnosis present

## 2023-05-15 DIAGNOSIS — Z79899 Other long term (current) drug therapy: Secondary | ICD-10-CM | POA: Diagnosis not present

## 2023-05-15 DIAGNOSIS — Z1152 Encounter for screening for COVID-19: Secondary | ICD-10-CM | POA: Diagnosis not present

## 2023-05-15 DIAGNOSIS — Z888 Allergy status to other drugs, medicaments and biological substances status: Secondary | ICD-10-CM | POA: Diagnosis not present

## 2023-05-15 DIAGNOSIS — R1111 Vomiting without nausea: Secondary | ICD-10-CM | POA: Diagnosis present

## 2023-05-15 DIAGNOSIS — F419 Anxiety disorder, unspecified: Secondary | ICD-10-CM | POA: Diagnosis present

## 2023-05-15 DIAGNOSIS — I10 Essential (primary) hypertension: Secondary | ICD-10-CM | POA: Diagnosis present

## 2023-05-15 DIAGNOSIS — K828 Other specified diseases of gallbladder: Secondary | ICD-10-CM | POA: Diagnosis not present

## 2023-05-15 DIAGNOSIS — E785 Hyperlipidemia, unspecified: Secondary | ICD-10-CM | POA: Diagnosis present

## 2023-05-15 DIAGNOSIS — K219 Gastro-esophageal reflux disease without esophagitis: Secondary | ICD-10-CM | POA: Diagnosis present

## 2023-05-15 LAB — BASIC METABOLIC PANEL
Anion gap: 9 (ref 5–15)
Anion gap: 9 (ref 5–15)
BUN: 14 mg/dL (ref 8–23)
BUN: 15 mg/dL (ref 8–23)
CO2: 23 mmol/L (ref 22–32)
CO2: 24 mmol/L (ref 22–32)
Calcium: 8.3 mg/dL — ABNORMAL LOW (ref 8.9–10.3)
Calcium: 8.5 mg/dL — ABNORMAL LOW (ref 8.9–10.3)
Chloride: 94 mmol/L — ABNORMAL LOW (ref 98–111)
Chloride: 93 mmol/L — ABNORMAL LOW (ref 98–111)
Creatinine, Ser: 1.02 mg/dL — ABNORMAL HIGH (ref 0.44–1.00)
Creatinine, Ser: 0.93 mg/dL (ref 0.44–1.00)
GFR, Estimated: 54 mL/min — ABNORMAL LOW (ref >60)
GFR, Estimated: 60 mL/min — ABNORMAL LOW (ref >60)
Glucose, Bld: 206 mg/dL — ABNORMAL HIGH (ref 70–99)
Glucose, Bld: 191 mg/dL — ABNORMAL HIGH (ref 70–99)
Potassium: 3.8 mmol/L (ref 3.5–5.1)
Potassium: 4 mmol/L (ref 3.5–5.1)
Sodium: 126 mmol/L — ABNORMAL LOW (ref 135–145)
Sodium: 126 mmol/L — ABNORMAL LOW (ref 135–145)

## 2023-05-15 LAB — RESP PANEL BY RT-PCR (RSV, FLU A&B, COVID)  RVPGX2
Influenza A by PCR: NEGATIVE
Influenza B by PCR: NEGATIVE
Resp Syncytial Virus by PCR: NEGATIVE
SARS Coronavirus 2 by RT PCR: NEGATIVE

## 2023-05-15 LAB — TROPONIN I (HIGH SENSITIVITY): Troponin I (High Sensitivity): 11 ng/L (ref <18)

## 2023-05-15 LAB — OSMOLALITY, URINE: Osmolality, Ur: 355 mosm/kg (ref 300–900)

## 2023-05-15 LAB — SODIUM, URINE, RANDOM: Sodium, Ur: <10 mmol/L

## 2023-05-15 LAB — OSMOLALITY: Osmolality: 287 mosm/kg (ref 275–295)

## 2023-05-15 MED ORDER — ALPRAZOLAM 0.25 MG PO TABS
0.2500 mg | ORAL_TABLET | Freq: Three times a day (TID) | ORAL | Status: DC | PRN
  Administered 2023-05-15 – 2023-05-17 (×3): 0.25 mg via ORAL
  Filled 2023-05-15 (×5): qty 1

## 2023-05-15 MED ORDER — ISOSORBIDE MONONITRATE ER 30 MG PO TB24
30.0000 mg | ORAL_TABLET | Freq: Every day | ORAL | Status: DC
  Administered 2023-05-15 – 2023-05-17 (×2): 30 mg via ORAL
  Filled 2023-05-15 (×3): qty 1

## 2023-05-15 MED ORDER — SODIUM CHLORIDE 0.9 % IV SOLN: INTRAVENOUS | Status: DC

## 2023-05-15 MED ORDER — AMLODIPINE BESYLATE 5 MG PO TABS
5.0000 mg | ORAL_TABLET | Freq: Every day | ORAL | Status: DC
  Administered 2023-05-15 – 2023-05-17 (×3): 5 mg via ORAL
  Filled 2023-05-15 (×3): qty 1

## 2023-05-15 MED ORDER — WARFARIN - PHARMACIST DOSING INPATIENT
Freq: Every day | Status: DC
Start: 2023-05-15 — End: 2023-05-17

## 2023-05-15 MED ORDER — ONDANSETRON HCL 4 MG PO TABS
4.0000 mg | ORAL_TABLET | Freq: Four times a day (QID) | ORAL | Status: DC | PRN
  Administered 2023-05-16 – 2023-05-17 (×2): 4 mg via ORAL
  Filled 2023-05-15 (×2): qty 1

## 2023-05-15 MED ORDER — ONDANSETRON HCL 4 MG/2ML IJ SOLN: 4.0000 mg | Freq: Four times a day (QID) | INTRAMUSCULAR | Status: DC | PRN

## 2023-05-15 MED ORDER — TRAZODONE HCL 50 MG PO TABS
25.0000 mg | ORAL_TABLET | Freq: Every evening | ORAL | Status: DC | PRN
Start: 2023-05-15 — End: 2023-05-17

## 2023-05-15 MED ORDER — SIMVASTATIN 20 MG PO TABS
20.0000 mg | ORAL_TABLET | Freq: Every day | ORAL | Status: DC
Start: 2023-05-15 — End: 2023-05-17
  Administered 2023-05-16: 20 mg via ORAL
  Filled 2023-05-15: qty 1

## 2023-05-15 MED ORDER — ACETAMINOPHEN 325 MG PO TABS: 650.0000 mg | ORAL_TABLET | Freq: Four times a day (QID) | ORAL | Status: DC | PRN

## 2023-05-15 MED ORDER — ACETAMINOPHEN 650 MG RE SUPP: 650.0000 mg | Freq: Four times a day (QID) | RECTAL | Status: DC | PRN

## 2023-05-15 MED ORDER — WARFARIN SODIUM 5 MG PO TABS
5.0000 mg | ORAL_TABLET | Freq: Once | ORAL | Status: AC
  Administered 2023-05-15: 5 mg via ORAL
  Filled 2023-05-15: qty 1

## 2023-05-15 MED ORDER — PANTOPRAZOLE SODIUM 40 MG PO TBEC
40.0000 mg | DELAYED_RELEASE_TABLET | Freq: Every day | ORAL | Status: DC
  Administered 2023-05-15 – 2023-05-17 (×3): 40 mg via ORAL
  Filled 2023-05-15 (×3): qty 1

## 2023-05-15 MED ORDER — METOCLOPRAMIDE HCL 5 MG/ML IJ SOLN
5.0000 mg | Freq: Once | INTRAMUSCULAR | Status: AC
Start: 2023-05-15 — End: 2023-05-15
  Administered 2023-05-15: 5 mg via INTRAVENOUS
  Filled 2023-05-15: qty 2

## 2023-05-15 MED ORDER — ASPIRIN 81 MG PO TBEC
81.0000 mg | DELAYED_RELEASE_TABLET | Freq: Every day | ORAL | Status: DC
Start: 2023-05-15 — End: 2023-05-17
  Administered 2023-05-15 – 2023-05-17 (×3): 81 mg via ORAL
  Filled 2023-05-15 (×3): qty 1

## 2023-05-15 MED ORDER — HYDROMORPHONE HCL 1 MG/ML IJ SOLN
0.5000 mg | Freq: Once | INTRAMUSCULAR | Status: AC
  Administered 2023-05-15: 0.5 mg via INTRAVENOUS
  Filled 2023-05-15: qty 1

## 2023-05-15 MED ORDER — IOHEXOL 300 MG/ML  SOLN
100.0000 mL | Freq: Once | INTRAMUSCULAR | Status: AC | PRN
  Administered 2023-05-15: 100 mL via INTRAVENOUS

## 2023-05-15 MED ORDER — METOPROLOL SUCCINATE ER 50 MG PO TB24
75.0000 mg | ORAL_TABLET | Freq: Two times a day (BID) | ORAL | Status: DC
  Administered 2023-05-15 – 2023-05-17 (×5): 75 mg via ORAL
  Filled 2023-05-15 (×5): qty 1

## 2023-05-15 MED ORDER — ALBUTEROL SULFATE (2.5 MG/3ML) 0.083% IN NEBU: 2.5000 mg | INHALATION_SOLUTION | RESPIRATORY_TRACT | Status: DC | PRN

## 2023-05-15 NOTE — H&P (Signed)
History and Physical  [name] XBJ:[id number] DOB: 02/19/1935 DOA: 05/14/2023  PCP: Tresa Garter, MD   Chief Complaint: Body aches, abdominal pain  HPI: [name] is a 89 y.o. female with medical history significant for anxiety, hyperlipidemia, hypertension, paroxysmal atrial fibrillation being admitted to the hospital with hyponatremia.  States that she was in her usual state of health until a couple days ago, when she started having a nonproductive cough, body aches, and some vomiting.  She denies any fevers, chest pain, or palpitations.  States that she has been compliant with her home medications, denies taking any as needed Lasix over the last week or so.  Evaluation in the emergency department was essentially unrevealing, including CT scan of the abdomen and pelvis, as well as right upper quadrant ultrasound.  Currently she denies any abdominal pain or nausea.  However states that she did throw up a few times in the last couple of days, she thinks it was because she was coughing.  Review of Systems: Please see HPI for pertinent positives and negatives. A complete 10 system review of systems are otherwise negative.  Past Medical History:  Diagnosis Date   Anxiety state, unspecified    Bronchitis, not specified as acute or chronic    Diaphragmatic hernia without mention of obstruction or gangrene    Dyslipidemia    Essential hypertension    Irritable bowel syndrome    Mitral valve disorders(424.0)    a. 08/2010 Echo: EF >55%, mild MR, mod TR, trace AI.   Non-obstructive CAD    a. 07/2010 Lexiscan MV: no ischemia, EF 78%; b. 07/2010 Cath: LM nl, LAD50/60p, 40 apical, LCX nl, RCA dominant, 30/40p, 1m.   Osteoarthrosis, unspecified whether generalized or localized, unspecified site    Osteoporosis, unspecified    Other abnormal glucose    PAF (paroxysmal atrial fibrillation) (HCC)    a. CHA2DS2VASc = 4-->coumadin.   Phlebitis and thrombophlebitis of superficial  vessels of lower extremities    PSVT (paroxysmal supraventricular tachycardia) (HCC)    a. 05/2012 Holter: short bursts of PSVT noted-->Managed with toprol.   Unspecified venous (peripheral) insufficiency    Unspecified vitamin D deficiency    Past Surgical History:  Procedure Laterality Date   CARDIAC CATHETERIZATION  07/30/2010   nonobstructive CAD, 20-40% RCA stenosis, 50-60% eccentric LAD stenosis more prox, 40% LAD stenosis more distal, EF 65%   TRANSTHORACIC ECHOCARDIOGRAM  09/03/2010   EF=>55%; mild MR; mod TR; AV mildly sclerotic with trace regurg; mild pulm valve regurg   VESICOVAGINAL FISTULA CLOSURE W/ TAH     Social History:  reports that she has never smoked. She has never used smokeless tobacco. She reports that she does not drink alcohol and does not use drugs.  Allergies  Allergen Reactions   Acetaminophen-Codeine     Made me depressed   Clarithromycin     REACTION: unspecified   Fosamax [Alendronate Sodium]     achy   Guaifenesin Er     itching   Metformin     REACTION: pt states INTOL to Metformin "felt drained"   Prandin [Repaglinide]     Prandin dropped CBGs to 66 once - she stopped it.   Tape     Adhesive tape---rash    Family History  Problem Relation Age of Onset   Stroke Mother    Stroke Father        also heart disease   Clotting disorder Paternal Grandmother        blood  clot   Stroke Brother        also heart disease     Prior to Admission medications   Medication Sig Start Date End Date Taking? Authorizing Provider  acetaminophen (TYLENOL) 500 MG tablet Take 500 mg by mouth every 6 (six) hours as needed.    [provider]  ALPRAZolam (XANAX) 0.5 MG tablet TAKE 1/2 TO 1 TABLET BY MOUTH THREE TIMES DAILY AS NEEDED FOR ANXIETY. DO NOT TAKE WITH TRAMADOL. THIS IS A 30 DAY SUPPLY 04/07/23   Plotnikov, Georgina Quint, MD  amLODipine (NORVASC) 5 MG tablet Take 1 tablet (5 mg total) by mouth daily. 04/02/23   Plotnikov, Georgina Quint, MD  aspirin 81  MG tablet Take 81 mg by mouth daily.    [provider]  Cholecalciferol (VITAMIN D) 50 MCG (2000 UT) CAPS TAKE 1 CAPSULE BY MOUTH EVERY DAY 12/26/22   Plotnikov, Georgina Quint, MD  diclofenac Sodium (VOLTAREN) 1 % GEL Apply 2 g topically 4 (four) times daily. 11/01/19   Cristie Hem, PA-C  ezetimibe (ZETIA) 10 MG tablet Take 10 mg by mouth daily. Patient not taking: Reported on 04/29/2023 09/29/22   [provider]  furosemide (LASIX) 20 MG tablet TAKE 1 TABLET(20 MG) BY MOUTH DAILY AS NEEDED FOR SWELLING 12/06/21   Hilty, [name], MD  hydroxypropyl methylcellulose (ISOPTO TEARS) 2.5 % ophthalmic solution Place 1 drop into both eyes 2 (two) times daily.    [provider]  hyoscyamine (LEVSIN) 0.125 MG tablet TAKE 1 TABLET BY MOUTH EVERY 6 HOURS AS NEEDED FOR UPTO 10 DAYS FOR CRAMPING 04/24/22   Plotnikov, Georgina Quint, MD  isosorbide mononitrate (IMDUR) 30 MG 24 hr tablet Take 1 tablet (30 mg total) by mouth daily. 04/02/23   Plotnikov, Georgina Quint, MD  metoprolol succinate (TOPROL-XL) 50 MG 24 hr tablet TAKE 1 1/2 tables in the morning and take 1 1/2 tablet in the evening. 03/24/23   Plotnikov, Georgina Quint, MD  pantoprazole (PROTONIX) 40 MG tablet TAKE 1 TABLET BY MOUTH EVERY DAY 12/08/22   Hilty, [name], MD  simvastatin (ZOCOR) 20 MG tablet Take 1 tablet (20 mg total) by mouth at bedtime. 10/22/22   Joylene Grapes, NP  warfarin (COUMADIN) 5 MG tablet TAKE 1/2 TO 1 TABLET BY MOUTH DAILY AS DIRECTED BY COUMADIN CLINIC 03/03/23   Chrystie Nose, MD    Physical Exam: BP 138/79   Pulse 70   Temp 98.2 F (36.8 C) (Oral)   Resp 17   Wt 87.2 kg   SpO2 92%   BMI 31.03 kg/m  General:  Alert, oriented, calm, in no acute distress, appears her stated age.  Overall she looks a little bit dehydrated. Eyes: EOMI, clear conjuctivae, white sclerea Neck: supple, no masses, trachea mildline  Cardiovascular: RRR, no murmurs or rubs, no peripheral edema  Respiratory: clear to  auscultation bilaterally, no wheezes, no crackles  Abdomen: soft, nontender, nondistended, normal bowel tones heard  Skin: dry, no rashes  Musculoskeletal: no joint effusions, normal range of motion  Psychiatric: appropriate affect, normal speech  Neurologic: extraocular muscles intact, clear speech, moving all extremities with intact sensorium         Labs on Admission:  Basic Metabolic Panel: Recent Labs  Lab 05/14/23 2008 05/15/23 0222  NA 126* 126*  K 3.6 3.8  CL 94* 94*  CO2 19* 23  GLUCOSE 247* 206*  BUN 13 14  CREATININE 0.78 1.02*  CALCIUM 8.6* 8.3*   Liver Function  Tests: Recent Labs  Lab 05/14/23 2008  AST 29  ALT 18  ALKPHOS 55  BILITOT 1.1  PROT 7.7  ALBUMIN 4.2   Recent Labs  Lab 05/14/23 2008  LIPASE 23   No results for input(s): "AMMONIA" in the last 168 hours. CBC: Recent Labs  Lab 05/14/23 2008  WBC 12.0*  NEUTROABS 10.5*  HGB 13.1  HCT 40.1  MCV 88.1  PLT 213   Cardiac Enzymes: No results for input(s): "CKTOTAL", "CKMB", "CKMBINDEX", "TROPONINI" in the last 168 hours. BNP (last 3 results) No results for input(s): "BNP" in the last 8760 hours.  ProBNP (last 3 results) No results for input(s): "PROBNP" in the last 8760 hours.  CBG: No results for input(s): "GLUCAP" in the last 168 hours.  Radiological Exams on Admission: US ABDOMEN LIMITED RUQ (LIVER/GB) Result Date: 05/15/2023 CLINICAL DATA:  89 year old female with history of upper abdominal pain. EXAM: ULTRASOUND ABDOMEN LIMITED RIGHT UPPER QUADRANT COMPARISON:  No prior abdominal ultrasound. CT of the abdomen and pelvis 05/15/2023. FINDINGS: Gallbladder: Numerous echogenic foci lying dependently in the gallbladder, some of which demonstrate posterior acoustic shadowing, compatible with a combination of biliary sludge and small gallstones measuring up to 7 mm. Gallbladder is moderately distended. Gallbladder wall thickness is normal at 3 mm. No pericholecystic fluid. Per report from  the sonographer, there was no sonographic Murphy's sign on examination. Common bile duct: Diameter: 4 mm. Liver: No focal lesion identified. Within normal limits in parenchymal echogenicity. Portal vein is patent on color Doppler imaging with normal direction of blood flow towards the liver. Other: 2.4 x 2.9 x 2.0 cm hypoechoic lesion in the upper right retroperitoneum corresponds to right adrenal nodule as demonstrated on recent CT examination (previously characterized as an adrenal adenoma). Multiple cysts in the right kidney incidentally noted. IMPRESSION: 1. Cholelithiasis and biliary sludge in the gallbladder, without definitive sonographic evidence of acute cholecystitis at this time. Electronically Signed   By: Trudie Reed M.D.   On: 05/15/2023 07:10   CT ABDOMEN PELVIS W CONTRAST Result Date: 05/15/2023 CLINICAL DATA:  Acute, nonlocalized abdominal pain. EXAM: CT ABDOMEN AND PELVIS WITH CONTRAST TECHNIQUE: Multidetector CT imaging of the abdomen and pelvis was performed using the standard protocol following bolus administration of intravenous contrast. RADIATION DOSE REDUCTION: This exam was performed according to the departmental dose-optimization program which includes automated exposure control, adjustment of the mA and/or kV according to patient size and/or use of iterative reconstruction technique. CONTRAST:  OMNIPAQUE IOHEXOL 300 MG/ML  SOLN COMPARISON:  03/28/2021 abdominal MRI FINDINGS: Lower chest: Atheromatous calcification of the aorta and coronaries. Atelectasis at the right lung base. Hepatobiliary: No focal liver abnormality by CT, reference prior MRI.Distended gallbladder with layering calculi. There is some right upper quadrant inflammation with fat stranding, favored to be biliary. No ductal dilatation or ductal stone identified. Pancreas: Generalized atrophy Spleen: Unremarkable. Adrenals/Urinary Tract: History of right adrenal adenoma with stable size measuring 2.4 cm. No  hydronephrosis or stone. Multiple bilateral renal cortical cysts. Known solid and cystic mass in the upper pole left kidney measuring 14 mm, previously 11. Unremarkable bladder. Stomach/Bowel: No obstruction. No visible bowel inflammation, including in the colon adjacent to the right upper quadrant fat stranding. Vascular/Lymphatic: No acute vascular abnormality. Multifocal atheromatous calcification of the aorta and iliacs. No mass or adenopathy. Reproductive:Hysterectomy Other: No ascites or pneumoperitoneum. Musculoskeletal: No acute abnormalities. Chronic L1 superior endplate fracture. Lumbar spine degeneration with L4-5 mild anterolisthesis and disc bulging causing foraminal stenosis greater on the right.  IMPRESSION: 1. Cholelithiasis, gallbladder distension, and regional fat inflammation concerning for acute cholecystitis. Recommend ultrasound. 2. 14 mm enhancing left renal mass has mildly enlarged from a 2020 MRI, suspected renal cell carcinoma. 3. Other chronic findings are stable. Electronically Signed   By: Tiburcio Pea M.D.   On: 05/15/2023 06:06   DG Chest 2 View Result Date: 05/14/2023 CLINICAL DATA:  Chest pain EXAM: CHEST - 2 VIEW COMPARISON:  10/11/2022 FINDINGS: Cardiac shadow is stable. Lungs are well aerated bilaterally. No focal infiltrate or effusion is seen. No bony abnormality is noted. IMPRESSION: No acute abnormality noted. Electronically Signed   By: Alcide Clever M.D.   On: 05/14/2023 21:25   Assessment/Plan [name] is a 89 y.o. female with medical history significant for anxiety, hyperlipidemia, hypertension, paroxysmal atrial fibrillation being admitted to the hospital with hyponatremia.   Hyponatremia-unclear etiology, though I suspect this is a hypovolemic hyponatremia in the setting of recent vomiting.  She does take Lasix at home as needed, but denies taking it in the last week or so.  No other obvious inciting medications. -Observation admission -Fluid  restriction of 1800 cc/24 hours -Check serum osmolality, urine osmolality and sodium -Gentle hydration with normal saline -Check sodium level intermittently  Hypertension-continue home Toprol-XL, Imdur, amlodipine  Paroxysmal atrial fibrillation-continue Toprol-XL, and Coumadin (INR is at goal)  Anxiety-Xanax as needed  Hyperlipidemia-Zocor  GERD-Protonix  DVT prophylaxis: Continue Coumadin    Code Status: Full Code  Consults called: None  Admission status: Observation  Time spent: 49 minutes  Nikeshia Keetch Sharlette Dense MD [facility] Pager [phone]  If 7PM-7AM, please contact night-coverage [email or web address] Password [facility]  05/15/2023, 8:37 AM

## 2023-05-15 NOTE — Progress Notes (Signed)
PHARMACY - ANTICOAGULATION CONSULT NOTE  Pharmacy Consult for Warfarin Indication: atrial fibrillation  Allergies  Allergen Reactions   Acetaminophen-Codeine     Made me depressed   Clarithromycin     REACTION: unspecified   Fosamax [Alendronate Sodium]     achy   Guaifenesin Er     itching   Metformin     REACTION: pt states INTOL to Metformin "felt drained"   Prandin [Repaglinide]     Prandin dropped CBGs to 66 once - she stopped it.   Tape     Adhesive tape---rash    Patient Measurements: Weight: 87.2 kg (192 lb 3.9 oz)  Vital Signs: Temp: 98.2 F (36.8 C) (01/31 0840) Temp Source: Oral (01/31 0840) BP: 143/93 (01/31 1200) Pulse Rate: 86 (01/31 1200)  Labs: Recent Labs    05/14/23 2008 05/14/23 2329 05/15/23 0222  HGB 13.1  --   --   HCT 40.1  --   --   PLT 213  --   --   LABPROT  --  26.7*  --   INR  --  2.4*  --   CREATININE 0.78  --  1.02*  TROPONINIHS 8 11  --     Estimated Creatinine Clearance: 44.1 mL/min (A) (by C-G formula based on SCr of 1.02 mg/dL (H)).   Medical History: Past Medical History:  Diagnosis Date   Anxiety state, unspecified    Bronchitis, not specified as acute or chronic    Diaphragmatic hernia without mention of obstruction or gangrene    Dyslipidemia    Essential hypertension    Irritable bowel syndrome    Mitral valve disorders(424.0)    a. 08/2010 Echo: EF >55%, mild MR, mod TR, trace AI.   Non-obstructive CAD    a. 07/2010 Lexiscan MV: no ischemia, EF 78%; b. 07/2010 Cath: LM nl, LAD50/60p, 40 apical, LCX nl, RCA dominant, 30/40p, 16m.   Osteoarthrosis, unspecified whether generalized or localized, unspecified site    Osteoporosis, unspecified    Other abnormal glucose    PAF (paroxysmal atrial fibrillation) (HCC)    a. CHA2DS2VASc = 4-->coumadin.   Phlebitis and thrombophlebitis of superficial vessels of lower extremities    PSVT (paroxysmal supraventricular tachycardia) (HCC)    a. 05/2012 Holter: short bursts of  PSVT noted-->Managed with toprol.   Unspecified venous (peripheral) insufficiency    Unspecified vitamin D deficiency     Assessment: 57 y/oF with PMH of paroxysmal atrial fibrillation on warfarin PTA admitted with hyponatremia. Pharmacy consulted to manage warfarin while patient admitted. PTA med list awaiting completion, but per anti-coag visit note on 04/28/23, warfarin regimen is 5mg  PO daily. INR 2.4 on admission. Hgb, Plt WNL.   Goal of Therapy:  INR 2-3 Monitor platelets by anticoagulation protocol: Yes   Plan:  Warfarin 5mg  PO x 1 Daily PT/INR Monitor CBC and for s/sx of bleeding   Greer Pickerel, PharmD, BCPS Clinical Pharmacist 05/15/2023 3:03 PM

## 2023-05-16 DIAGNOSIS — E871 Hypo-osmolality and hyponatremia: Principal | ICD-10-CM

## 2023-05-16 LAB — CBC
HCT: 38.3 % (ref 36.0–46.0)
Hemoglobin: 12.6 g/dL (ref 12.0–15.0)
MCH: 29.2 pg (ref 26.0–34.0)
MCHC: 32.9 g/dL (ref 30.0–36.0)
MCV: 88.7 fL (ref 80.0–100.0)
Platelets: 199 10*3/uL (ref 150–400)
RBC: 4.32 MIL/uL (ref 3.87–5.11)
RDW: 12.8 % (ref 11.5–15.5)
WBC: 13.3 10*3/uL — ABNORMAL HIGH (ref 4.0–10.5)
nRBC: 0 % (ref 0.0–0.2)

## 2023-05-16 LAB — RESPIRATORY PANEL BY PCR

## 2023-05-16 LAB — BASIC METABOLIC PANEL
Anion gap: 7 (ref 5–15)
BUN: 14 mg/dL (ref 8–23)
CO2: 26 mmol/L (ref 22–32)
Calcium: 8.5 mg/dL — ABNORMAL LOW (ref 8.9–10.3)
Chloride: 97 mmol/L — ABNORMAL LOW (ref 98–111)
Creatinine, Ser: 1.01 mg/dL — ABNORMAL HIGH (ref 0.44–1.00)
GFR, Estimated: 54 mL/min — ABNORMAL LOW (ref >60)
Glucose, Bld: 154 mg/dL — ABNORMAL HIGH (ref 70–99)
Potassium: 3.7 mmol/L (ref 3.5–5.1)
Sodium: 130 mmol/L — ABNORMAL LOW (ref 135–145)

## 2023-05-16 LAB — PROTIME-INR
INR: 2.2 — ABNORMAL HIGH (ref 0.8–1.2)
Prothrombin Time: 25 s — ABNORMAL HIGH (ref 11.4–15.2)

## 2023-05-16 MED ORDER — GUAIFENESIN 100 MG/5ML PO LIQD: 5.0000 mL | ORAL | Status: DC | PRN

## 2023-05-16 MED ORDER — HYDRALAZINE HCL 20 MG/ML IJ SOLN: 10.0000 mg | INTRAMUSCULAR | Status: DC | PRN

## 2023-05-16 MED ORDER — SODIUM CHLORIDE 0.9 % IV SOLN: INTRAVENOUS | Status: AC

## 2023-05-16 MED ORDER — METOPROLOL TARTRATE 5 MG/5ML IV SOLN: 5.0000 mg | INTRAVENOUS | Status: DC | PRN

## 2023-05-16 MED ORDER — WARFARIN SODIUM 5 MG PO TABS
5.0000 mg | ORAL_TABLET | Freq: Once | ORAL | Status: AC
  Administered 2023-05-16: 5 mg via ORAL
  Filled 2023-05-16 (×2): qty 1

## 2023-05-16 MED ORDER — GLUCAGON HCL RDNA (DIAGNOSTIC) 1 MG IJ SOLR: 1.0000 mg | INTRAMUSCULAR | Status: DC | PRN

## 2023-05-16 MED ORDER — SENNOSIDES-DOCUSATE SODIUM 8.6-50 MG PO TABS: 1.0000 | ORAL_TABLET | Freq: Every evening | ORAL | Status: DC | PRN

## 2023-05-16 MED ORDER — OXYCODONE HCL 5 MG PO TABS: 5.0000 mg | ORAL_TABLET | ORAL | Status: DC | PRN

## 2023-05-16 MED ORDER — IPRATROPIUM-ALBUTEROL 0.5-2.5 (3) MG/3ML IN SOLN: 3.0000 mL | RESPIRATORY_TRACT | Status: DC | PRN

## 2023-05-16 NOTE — Progress Notes (Signed)
OT Cancellation Note  Patient Details Name: [name] MRN: [id number] DOB: [birth year]   Cancelled Treatment:    Reason Eval/Treat Not Completed: Other (comment). Pt just got her food tray. Will try back at a later time as schedule allows.  Lindon Romp OT Acute Rehabilitation Services Office [phone]    Evette Georges 05/16/2023, 3:01 PM

## 2023-05-16 NOTE — Hospital Course (Addendum)
Brief Narrative:   89 year old with history of anxiety, depression, HLD, HTN, paroxysmal A-fib admitted for hyponatremia.  Patient reporting of nonproductive cough, body aches for couple of days.  Upon admission noted to have hyponatremia.  Overnight patient was hydrated, hyponatremia resolved.  Generalized body aches and fevers also subsided.  All the infectious workup including respiratory panel were negative.  Today patient is medically stable for discharge.  PT OT recommending home health therefore arrangements being made Spoke with patient's daughter at bedside as well.  Assessment & Plan:  Principal Problem:   Hyponatremia   Hyponatremia; hypovolemic, resolved Admission Na 126.  With IV fluids hyponatremia essentially has resolved.  Tolerating oral at this point.  Sodium is 131 today.    Generalized body aches - Resolved.  Although viral workup is negative.  No evidence of infection  Essential hypertension -Norvasc, Imdur, Toprol-XL.  IV as needed  Paroxysmal atrial fibrillation -Toprol-XL, resume home Coumadin.  Discharge INR 2.2  Anxiety/depression - P.o. and Xanax  Hyperlipidemia -Zocor  GERD -PPI   DVT prophylaxis: SCDs Start: 05/15/23 0836    Code Status: Full Code Family Communication: Family at bedside Status is: Inpatient Remains inpatient appropriate because: Discharge  Subjective: Doing well no complaints.  Wishing to go home.  Examination:  General exam: Appears calm and comfortable  Respiratory system: Clear to auscultation. Respiratory effort normal. Cardiovascular system: S1 & S2 heard, RRR. No JVD, murmurs, rubs, gallops or clicks. No pedal edema. Gastrointestinal system: Abdomen is nondistended, soft and nontender. No organomegaly or masses felt. Normal bowel sounds heard. Central nervous system: Alert and oriented. No focal neurological deficits. Extremities: Symmetric 5 x 5 power. Skin: No rashes, lesions or ulcers Psychiatry: Judgement and  insight appear normal. Mood & affect appropriate.

## 2023-05-16 NOTE — Evaluation (Addendum)
Physical Therapy Evaluation Patient Details Name: [name] MRN: [id number] DOB: May 16, 1934 Today's Date: 05/16/2023  History of Present Illness  [name] is a 89 y.o. female admitted to the hospital with hyponatremia.  PHMx: anxiety, hyperlipidemia, hypertension, paroxysmal atrial fibrillation  Clinical Impression  Pt admitted with above diagnosis.  Pt is independent at her baseline, per family has been less active recently; pt amb 120' with RW and CGA today, fatigued but not dyspneic, SpO2=97% on RA Will benefit from HHPT (family requests d/t recent functional decline) and RW  at d/c  Pt currently with functional limitations due to the deficits listed below (see PT Problem List). Pt will benefit from acute skilled PT to increase their independence and safety with mobility to allow discharge.           If plan is discharge home, recommend the following: Help with stairs or ramp for entrance   Can travel by private vehicle        Equipment Recommendations Rolling walker (2 wheels)  Recommendations for Other Services       Functional Status Assessment Patient has had a recent decline in their functional status and demonstrates the ability to make significant improvements in function in a reasonable and predictable amount of time.     Precautions / Restrictions Precautions Precautions: Fall Restrictions Weight Bearing Restrictions Per Provider Order: No      Mobility  Bed Mobility Overal bed mobility: Needs Assistance Bed Mobility: Supine to Sit, Sit to Supine     Supine to sit: Supervision, Modified independent (Device/Increase time) Sit to supine: Supervision, Modified independent (Device/Increase time)        Transfers Overall transfer level: Needs assistance Equipment used: Rolling walker (2 wheels) Transfers: Sit to/from Stand Sit to Stand: Contact guard assist, Supervision           General transfer comment: for safety     Ambulation/Gait Ambulation/Gait assistance: Contact guard assist Gait Distance (Feet): 120 Feet Assistive device: Rolling walker (2 wheels) Gait Pattern/deviations: Step-through pattern, Decreased stride length       General Gait Details: guarded gait, slow gait speed. no overt LOB  Stairs            Wheelchair Mobility     Tilt Bed    Modified Rankin (Stroke Patients Only)       Balance Overall balance assessment: Needs assistance Sitting-balance support: Feet supported, No upper extremity supported Sitting balance-Leahy Scale: Good     Standing balance support: No upper extremity supported Standing balance-Leahy Scale: Fair                               Pertinent Vitals/Pain Pain Assessment Pain Assessment: No/denies pain    Home Living Family/patient expects to be discharged to:: Private residence Living Arrangements: Children (son) Available Help at Discharge: Family;Available PRN/intermittently Type of Home: House Home Access: Stairs to enter   [employer] of Steps: "a few"   Home Layout: One level Home Equipment: None      Prior Function Prior Level of Function : Independent/Modified Independent             Mobility Comments: less active per family       Extremity/Trunk Assessment   Upper Extremity Assessment Upper Extremity Assessment: Defer to OT evaluation    Lower Extremity Assessment Lower Extremity Assessment: Overall WFL for tasks assessed       Communication   Communication Communication:  No apparent difficulties  Cognition Arousal: Alert Behavior During Therapy: WFL for tasks assessed/performed Overall Cognitive Status: Within Functional Limits for tasks assessed                                          General Comments      Exercises     Assessment/Plan    PT Assessment Patient needs continued PT services  PT Problem List Decreased activity tolerance;Decreased  mobility;Decreased knowledge of use of DME       PT Treatment Interventions DME instruction;Therapeutic exercise;Gait training;Functional mobility training;Therapeutic activities;Patient/family education    PT Goals (Current goals can be found in the Care Plan section)  Acute Rehab PT Goals Patient Stated Goal: get stronger- pt; HHPT- family PT Goal Formulation: With patient Time For Goal Achievement: 05/30/23 Potential to Achieve Goals: Good    Frequency Min 1X/week     Co-evaluation               AM-PAC PT "6 Clicks" Mobility  Outcome Measure Help needed turning from your back to your side while in a flat bed without using bedrails?: A Little Help needed moving from lying on your back to sitting on the side of a flat bed without using bedrails?: A Little Help needed moving to and from a bed to a chair (including a wheelchair)?: A Little Help needed standing up from a chair using your arms (e.g., wheelchair or bedside chair)?: A Little Help needed to walk in hospital room?: A Little Help needed climbing 3-5 steps with a railing? : A Little 6 Click Score: 18    End of Session Equipment Utilized During Treatment: Gait belt Activity Tolerance: Patient tolerated treatment well Patient left: with call bell/phone within reach;in bed   PT Visit Diagnosis: Other abnormalities of gait and mobility (R26.89)    Time: 1610-9604 PT Time Calculation (min) (ACUTE ONLY): 22 min   Charges:   PT Evaluation $PT Eval Low Complexity: 1 Low   PT General Charges $$ ACUTE PT VISIT: 1 Visit         Caleb Prigmore, PT  Acute Rehab Dept ([facility]) [phone]  05/16/2023   [facility] 05/16/2023, 4:53 PM

## 2023-05-16 NOTE — Progress Notes (Signed)
PHARMACY - ANTICOAGULATION CONSULT NOTE  Pharmacy Consult for Warfarin Indication: atrial fibrillation  Allergies  Allergen Reactions   Acetaminophen-Codeine     Made me depressed   Clarithromycin     REACTION: unspecified   Fosamax [Alendronate Sodium]     achy   Guaifenesin Er     itching   Metformin     REACTION: pt states INTOL to Metformin "felt drained"   Prandin [Repaglinide]     Prandin dropped CBGs to 66 once - she stopped it.   Tape     Adhesive tape---rash    Patient Measurements: Weight: 87.2 kg (192 lb 3.9 oz)  Vital Signs: Temp: 98.3 F (36.8 C) (02/01 0521) Temp Source: Oral (02/01 0521) BP: 146/91 (02/01 0900) Pulse Rate: 66 (02/01 0900)  Labs: Recent Labs    05/14/23 2008 05/14/23 2329 05/15/23 0222 05/15/23 1741 05/16/23 0500  HGB 13.1  --   --   --  12.6  HCT 40.1  --   --   --  38.3  PLT 213  --   --   --  199  LABPROT  --  26.7*  --   --  25.0*  INR  --  2.4*  --   --  2.2*  CREATININE 0.78  --  1.02* 0.93 1.01*  TROPONINIHS 8 11  --   --   --     Estimated Creatinine Clearance: 44.5 mL/min (A) (by C-G formula based on SCr of 1.01 mg/dL (H)).   Medical History: Past Medical History:  Diagnosis Date   Anxiety state, unspecified    Bronchitis, not specified as acute or chronic    Diaphragmatic hernia without mention of obstruction or gangrene    Dyslipidemia    Essential hypertension    Irritable bowel syndrome    Mitral valve disorders(424.0)    a. 08/2010 Echo: EF >55%, mild MR, mod TR, trace AI.   Non-obstructive CAD    a. 07/2010 Lexiscan MV: no ischemia, EF 78%; b. 07/2010 Cath: LM nl, LAD50/60p, 40 apical, LCX nl, RCA dominant, 30/40p, 74m.   Osteoarthrosis, unspecified whether generalized or localized, unspecified site    Osteoporosis, unspecified    Other abnormal glucose    PAF (paroxysmal atrial fibrillation) (HCC)    a. CHA2DS2VASc = 4-->coumadin.   Phlebitis and thrombophlebitis of superficial vessels of lower  extremities    PSVT (paroxysmal supraventricular tachycardia) (HCC)    a. 05/2012 Holter: short bursts of PSVT noted-->Managed with toprol.   Unspecified venous (peripheral) insufficiency    Unspecified vitamin D deficiency     Assessment: 54 y/oF with PMH of paroxysmal atrial fibrillation on warfarin PTA admitted with hyponatremia. Pharmacy consulted to manage warfarin while patient admitted. PTA med list awaiting completion, but per anti-coag visit note on 04/28/23, warfarin regimen is 5mg  PO daily. INR 2.4 on admission. Hgb, Plt WNL.   Today, 05/16/23 INR is 2.2, therapeutic  Hgb is 12.6, plt 199  No meal intake charted  No new interacting meds started    Goal of Therapy:  INR 2-3 Monitor platelets by anticoagulation protocol: Yes   Plan:  Warfarin 5mg  PO x 1 Daily PT/INR Monitor CBC and for s/sx of bleeding   Adalberto Cole, PharmD, BCPS 05/16/2023 9:13 AM

## 2023-05-16 NOTE — Plan of Care (Signed)
  Problem: Education: Goal: Knowledge of General Education information will improve Description: Including pain rating scale, medication(s)/side effects and non-pharmacologic comfort measures Outcome: Progressing   Problem: Activity: Goal: Risk for activity intolerance will decrease Outcome: Progressing   Problem: Pain Managment: Goal: General experience of comfort will improve and/or be controlled Outcome: Progressing

## 2023-05-16 NOTE — Progress Notes (Signed)
PROGRESS NOTE    SHIRLIE ENCK  ZOX:[id number] DOB: Oct 23, 1934 DOA: 05/14/2023 PCP: Tresa Garter, MD    Brief Narrative:   89 year old with history of anxiety, depression, HLD, HTN, paroxysmal A-fib admitted for hyponatremia.  Patient reporting of nonproductive cough, body aches for couple of days.  Upon admission noted to have hyponatremia  Assessment & Plan:  Principal Problem:   Hyponatremia   Hyponatremia; hypovolemic -Suspect hypovolemic hyponatremia.  Urine studies reviewed.  Will discontinue any fluid restriction.  Continue gentle hydration.  Continue to trend sodium levels and replete and manage electrolytes as appropriate  Generalized body aches - Check 20 path respiratory panel - Flu, COVID/RSV are negative  Essential hypertension -Norvasc, Imdur, Toprol-XL.  IV as needed  Paroxysmal atrial fibrillation -Toprol-XL, Coumadin which is managed by pharmacy  Anxiety/depression - P.o. and Xanax  Hyperlipidemia -Zocor  GERD -PPI   DVT prophylaxis: SCDs Start: 05/15/23 0836    Code Status: Full Code Family Communication:  Called Britta Mccreedy Status is: Inpatient Remains inpatient appropriate because: Continue management for hyponatremia  Subjective: Sitting on the stretcher.  Attempting to eat her breakfast.  Overall tells me he feels little better today.  No other complaints at this time.  Examination:  General exam: Appears calm and comfortable  Respiratory system: Clear to auscultation. Respiratory effort normal. Cardiovascular system: S1 & S2 heard, RRR. No JVD, murmurs, rubs, gallops or clicks. No pedal edema. Gastrointestinal system: Abdomen is nondistended, soft and nontender. No organomegaly or masses felt. Normal bowel sounds heard. Central nervous system: Alert and oriented. No focal neurological deficits. Extremities: Symmetric 5 x 5 power. Skin: No rashes, lesions or ulcers Psychiatry: Judgement and insight appear normal. Mood & affect  appropriate.                Diet Orders (From admission, onward)     Start     Ordered   05/16/23 0840  Diet regular Fluid consistency: Thin  Diet effective now       Question:  Fluid consistency:  Answer:  Thin   05/16/23 0839            Objective: Vitals:   05/16/23 0700 05/16/23 0800 05/16/23 0900 05/16/23 1030  BP: (!) 142/97 (!) 152/81 (!) 146/91 (!) 161/126  Pulse: 64 75 66 71  Resp: 18 18 18 18   Temp:    98.1 F (36.7 C)  TempSrc:      SpO2: 99% 99% 99% 99%  Weight:       No intake or output data in the 24 hours ending 05/16/23 1119 Filed Weights   05/14/23 1955  Weight: 87.2 kg    Scheduled Meds:  amLODipine  5 mg Oral Daily   aspirin EC  81 mg Oral Daily   isosorbide mononitrate  30 mg Oral Daily   metoprolol succinate  75 mg Oral BID   pantoprazole  40 mg Oral Daily   simvastatin  20 mg Oral QHS   warfarin  5 mg Oral ONCE-1600   Warfarin - Pharmacist Dosing Inpatient   Does not apply q1600   Continuous Infusions:  sodium chloride 100 mL/hr at 05/16/23 1029    Nutritional status     Body mass index is 31.03 kg/m.  Data Reviewed:   CBC: Recent Labs  Lab 05/14/23 2008 05/16/23 0500  WBC 12.0* 13.3*  NEUTROABS 10.5*  --   HGB 13.1 12.6  HCT 40.1 38.3  MCV 88.1 88.7  PLT 213 199   Basic Metabolic Panel:  Recent Labs  Lab 05/14/23 2008 05/15/23 0222 05/15/23 1741 05/16/23 0500  NA 126* 126* 126* 130*  K 3.6 3.8 4.0 3.7  CL 94* 94* 93* 97*  CO2 19* 23 24 26   GLUCOSE 247* 206* 191* 154*  BUN 13 14 15 14   CREATININE 0.78 1.02* 0.93 1.01*  CALCIUM 8.6* 8.3* 8.5* 8.5*   GFR: Estimated Creatinine Clearance: 44.5 mL/min (A) (by C-G formula based on SCr of 1.01 mg/dL (H)). Liver Function Tests: Recent Labs  Lab 05/14/23 2008  AST 29  ALT 18  ALKPHOS 55  BILITOT 1.1  PROT 7.7  ALBUMIN 4.2   Recent Labs  Lab 05/14/23 2008  LIPASE 23   No results for input(s): "AMMONIA" in the last 168 hours. Coagulation  Profile: Recent Labs  Lab 05/14/23 2329 05/16/23 0500  INR 2.4* 2.2*   Cardiac Enzymes: No results for input(s): "CKTOTAL", "CKMB", "CKMBINDEX", "TROPONINI" in the last 168 hours. BNP (last 3 results) No results for input(s): "PROBNP" in the last 8760 hours. HbA1C: No results for input(s): "HGBA1C" in the last 72 hours. CBG: No results for input(s): "GLUCAP" in the last 168 hours. Lipid Profile: No results for input(s): "CHOL", "HDL", "LDLCALC", "TRIG", "CHOLHDL", "LDLDIRECT" in the last 72 hours. Thyroid Function Tests: No results for input(s): "TSH", "T4TOTAL", "FREET4", "T3FREE", "THYROIDAB" in the last 72 hours. Anemia Panel: No results for input(s): "VITAMINB12", "FOLATE", "FERRITIN", "TIBC", "IRON", "RETICCTPCT" in the last 72 hours. Sepsis Labs: No results for input(s): "PROCALCITON", "LATICACIDVEN" in the last 168 hours.  Recent Results (from the past 240 hours)  Resp panel by RT-PCR (RSV, Flu A&B, Covid) Anterior Nasal Swab     Status: None   Collection Time: 05/15/23 12:54 AM   Specimen: Anterior Nasal Swab  Result Value Ref Range Status   SARS Coronavirus 2 by RT PCR NEGATIVE NEGATIVE Final    Comment: (NOTE) SARS-CoV-2 target nucleic acids are NOT DETECTED.  The SARS-CoV-2 RNA is generally detectable in upper respiratory specimens during the acute phase of infection. The lowest concentration of SARS-CoV-2 viral copies this assay can detect is 138 copies/mL. A negative result does not preclude SARS-Cov-2 infection and should not be used as the sole basis for treatment or other patient management decisions. A negative result may occur with  improper specimen collection/handling, submission of specimen other than nasopharyngeal swab, presence of viral mutation(s) within the areas targeted by this assay, and inadequate number of viral copies(<138 copies/mL). A negative result must be combined with clinical observations, patient history, and  epidemiological information. The expected result is Negative.  Fact Sheet for Patients:  BloggerCourse.com  Fact Sheet for Healthcare Providers:  SeriousBroker.it  This test is no t yet approved or cleared by the [place] FDA and  has been authorized for detection and/or diagnosis of SARS-CoV-2 by FDA under an Emergency Use Authorization (EUA). This EUA will remain  in effect (meaning this test can be used) for the duration of the COVID-19 declaration under Section 564(b)(1) of the Act, 21 U.S.C.section 360bbb-3(b)(1), unless the authorization is terminated  or revoked sooner.       Influenza A by PCR NEGATIVE NEGATIVE Final   Influenza B by PCR NEGATIVE NEGATIVE Final    Comment: (NOTE) The Xpert Xpress SARS-CoV-2/FLU/RSV plus assay is intended as an aid in the diagnosis of influenza from Nasopharyngeal swab specimens and should not be used as a sole basis for treatment. Nasal washings and aspirates are unacceptable for Xpert Xpress SARS-CoV-2/FLU/RSV testing.  Fact Sheet for Patients:  BloggerCourse.com  Fact Sheet for Healthcare Providers: SeriousBroker.it  This test is not yet approved or cleared by the [place] FDA and has been authorized for detection and/or diagnosis of SARS-CoV-2 by FDA under an Emergency Use Authorization (EUA). This EUA will remain in effect (meaning this test can be used) for the duration of the COVID-19 declaration under Section 564(b)(1) of the Act, 21 U.S.C. section 360bbb-3(b)(1), unless the authorization is terminated or revoked.     Resp Syncytial Virus by PCR NEGATIVE NEGATIVE Final    Comment: (NOTE) Fact Sheet for Patients: BloggerCourse.com  Fact Sheet for Healthcare Providers: SeriousBroker.it  This test is not yet approved or cleared by the [place] FDA and has been  authorized for detection and/or diagnosis of SARS-CoV-2 by FDA under an Emergency Use Authorization (EUA). This EUA will remain in effect (meaning this test can be used) for the duration of the COVID-19 declaration under Section 564(b)(1) of the Act, 21 U.S.C. section 360bbb-3(b)(1), unless the authorization is terminated or revoked.  Performed at [facility], [address], [place], [place] [address]          Radiology Studies: US ABDOMEN LIMITED RUQ (LIVER/GB) Result Date: 05/15/2023 CLINICAL DATA:  89 year old female with history of upper abdominal pain. EXAM: ULTRASOUND ABDOMEN LIMITED RIGHT UPPER QUADRANT COMPARISON:  No prior abdominal ultrasound. CT of the abdomen and pelvis 05/15/2023. FINDINGS: Gallbladder: Numerous echogenic foci lying dependently in the gallbladder, some of which demonstrate posterior acoustic shadowing, compatible with a combination of biliary sludge and small gallstones measuring up to 7 mm. Gallbladder is moderately distended. Gallbladder wall thickness is normal at 3 mm. No pericholecystic fluid. Per report from the sonographer, there was no sonographic Murphy's sign on examination. Common bile duct: Diameter: 4 mm. Liver: No focal lesion identified. Within normal limits in parenchymal echogenicity. Portal vein is patent on color Doppler imaging with normal direction of blood flow towards the liver. Other: 2.4 x 2.9 x 2.0 cm hypoechoic lesion in the upper right retroperitoneum corresponds to right adrenal nodule as demonstrated on recent CT examination (previously characterized as an adrenal adenoma). Multiple cysts in the right kidney incidentally noted. IMPRESSION: 1. Cholelithiasis and biliary sludge in the gallbladder, without definitive sonographic evidence of acute cholecystitis at this time. Electronically Signed   By: Trudie Reed M.D.   On: 05/15/2023 07:10   CT ABDOMEN PELVIS W CONTRAST Result Date: 05/15/2023 CLINICAL DATA:   Acute, nonlocalized abdominal pain. EXAM: CT ABDOMEN AND PELVIS WITH CONTRAST TECHNIQUE: Multidetector CT imaging of the abdomen and pelvis was performed using the standard protocol following bolus administration of intravenous contrast. RADIATION DOSE REDUCTION: This exam was performed according to the departmental dose-optimization program which includes automated exposure control, adjustment of the mA and/or kV according to patient size and/or use of iterative reconstruction technique. CONTRAST:  OMNIPAQUE IOHEXOL 300 MG/ML  SOLN COMPARISON:  03/28/2021 abdominal MRI FINDINGS: Lower chest: Atheromatous calcification of the aorta and coronaries. Atelectasis at the right lung base. Hepatobiliary: No focal liver abnormality by CT, reference prior MRI.Distended gallbladder with layering calculi. There is some right upper quadrant inflammation with fat stranding, favored to be biliary. No ductal dilatation or ductal stone identified. Pancreas: Generalized atrophy Spleen: Unremarkable. Adrenals/Urinary Tract: History of right adrenal adenoma with stable size measuring 2.4 cm. No hydronephrosis or stone. Multiple bilateral renal cortical cysts. Known solid and cystic mass in the upper pole left kidney measuring 14 mm, previously 11. Unremarkable bladder. Stomach/Bowel: No obstruction. No visible bowel  inflammation, including in the colon adjacent to the right upper quadrant fat stranding. Vascular/Lymphatic: No acute vascular abnormality. Multifocal atheromatous calcification of the aorta and iliacs. No mass or adenopathy. Reproductive:Hysterectomy Other: No ascites or pneumoperitoneum. Musculoskeletal: No acute abnormalities. Chronic L1 superior endplate fracture. Lumbar spine degeneration with L4-5 mild anterolisthesis and disc bulging causing foraminal stenosis greater on the right. IMPRESSION: 1. Cholelithiasis, gallbladder distension, and regional fat inflammation concerning for acute cholecystitis. Recommend  ultrasound. 2. 14 mm enhancing left renal mass has mildly enlarged from a 2020 MRI, suspected renal cell carcinoma. 3. Other chronic findings are stable. Electronically Signed   By: Tiburcio Pea M.D.   On: 05/15/2023 06:06   DG Chest 2 View Result Date: 05/14/2023 CLINICAL DATA:  Chest pain EXAM: CHEST - 2 VIEW COMPARISON:  10/11/2022 FINDINGS: Cardiac shadow is stable. Lungs are well aerated bilaterally. No focal infiltrate or effusion is seen. No bony abnormality is noted. IMPRESSION: No acute abnormality noted. Electronically Signed   By: Alcide Clever M.D.   On: 05/14/2023 21:25           LOS: 1 day   Time spent= 35 mins    Miguel Rota, MD [facility]  If 7PM-7AM, please contact night-coverage  05/16/2023, 11:19 AM

## 2023-05-17 DIAGNOSIS — E871 Hypo-osmolality and hyponatremia: Secondary | ICD-10-CM | POA: Diagnosis not present

## 2023-05-17 LAB — CBC
HCT: 38.1 % (ref 36.0–46.0)
Hemoglobin: 12.3 g/dL (ref 12.0–15.0)
MCH: 28.6 pg (ref 26.0–34.0)
MCHC: 32.3 g/dL (ref 30.0–36.0)
MCV: 88.6 fL (ref 80.0–100.0)
Platelets: 190 10*3/uL (ref 150–400)
RBC: 4.3 MIL/uL (ref 3.87–5.11)
RDW: 12.8 % (ref 11.5–15.5)
WBC: 10.9 10*3/uL — ABNORMAL HIGH (ref 4.0–10.5)
nRBC: 0 % (ref 0.0–0.2)

## 2023-05-17 LAB — BASIC METABOLIC PANEL
Anion gap: 8 (ref 5–15)
BUN: 12 mg/dL (ref 8–23)
CO2: 23 mmol/L (ref 22–32)
Calcium: 8.1 mg/dL — ABNORMAL LOW (ref 8.9–10.3)
Chloride: 100 mmol/L (ref 98–111)
Creatinine, Ser: 0.97 mg/dL (ref 0.44–1.00)
GFR, Estimated: 57 mL/min — ABNORMAL LOW (ref >60)
Glucose, Bld: 189 mg/dL — ABNORMAL HIGH (ref 70–99)
Potassium: 3.6 mmol/L (ref 3.5–5.1)
Sodium: 131 mmol/L — ABNORMAL LOW (ref 135–145)

## 2023-05-17 LAB — PROTIME-INR
INR: 2.2 — ABNORMAL HIGH (ref 0.8–1.2)
Prothrombin Time: 25.1 s — ABNORMAL HIGH (ref 11.4–15.2)

## 2023-05-17 LAB — MAGNESIUM: Magnesium: 2.1 mg/dL (ref 1.7–2.4)

## 2023-05-17 MED ORDER — WARFARIN SODIUM 5 MG PO TABS: 5.0000 mg | ORAL_TABLET | Freq: Once | ORAL | Status: DC

## 2023-05-17 NOTE — Evaluation (Signed)
Occupational Therapy Evaluation Patient Details Name: [name] MRN: [id number] DOB: May 12, 1934 Today's Date: 05/17/2023   History of Present Illness [name] is a 89 y.o. female admitted to the hospital with hyponatremia.  PHMx: anxiety, hyperlipidemia, hypertension, paroxysmal atrial fibrillation   Clinical Impression   Patient is a 89 year old female who was admitted for above. Patient reported living at home with son support. Currently, patient was mod A for sit to stand with CGA to transfer in room with RW with increased cues for safety. Patient demonstrated poor problem sloving of positioning sitting EOB to be able to maintain balance and engage in self feeding with education provided on sitting up in recliner for meals to eliminate need to maintain/sequence positioning for balance EOB. Nurse made aware as well. Patient was noted to have decreased functional activity tolerance, decreased endurance, decreased standing balance, decreased safety awareness, and decreased knowledge of AD/AE impacting participation in ADLs. Recommend patient havbe 24/7 caregiver support in next level of care. Patient would continue to benefit from skilled OT services at this time while admitted and after d/c to address noted deficits in order to improve overall safety and independence in ADLs.         If plan is discharge home, recommend the following: A lot of help with bathing/dressing/bathroom;Assistance with cooking/housework;Direct supervision/assist for medications management;Assist for transportation;Help with stairs or ramp for entrance;Direct supervision/assist for financial management;Supervision due to cognitive status;A little help with walking and/or transfers    Functional Status Assessment  Patient has had a recent decline in their functional status and demonstrates the ability to make significant improvements in function in a reasonable and predictable amount of time.  Equipment  Recommendations  None recommended by OT       Precautions / Restrictions Precautions Precautions: Fall Restrictions Weight Bearing Restrictions Per Provider Order: No      Mobility Bed Mobility               General bed mobility comments: patient was sitting EOB with RLE lying straight in bed and LLE handing down over edge of bed with patient unable to maintain balance and engage in self feeding tasks. patient positioned in recliner at end of session.            Balance Overall balance assessment: Needs assistance Sitting-balance support: Feet supported, No upper extremity supported Sitting balance-Leahy Scale: Poor     Standing balance support: No upper extremity supported Standing balance-Leahy Scale: Fair           ADL either performed or assessed with clinical judgement   ADL Overall ADL's : Needs assistance/impaired Eating/Feeding: Set up Eating/Feeding Details (indicate cue type and reason): ed on sitting in recliner to maintain balance for safe swallowing. nurse made aware as well. Grooming: Sitting;Set up;Supervision/safety   Upper Body Bathing: Contact guard assist;Sitting   Lower Body Bathing: Sitting/lateral leans;Moderate assistance   Upper Body Dressing : Contact guard assist;Standing   Lower Body Dressing: Moderate assistance;Sitting/lateral leans   Toilet Transfer: Ambulation;Rolling walker (2 wheels);Moderate assistance Toilet Transfer Details (indicate cue type and reason): mod A for sit to stand ten CGA to continue around to recliner in room with increased cues for sequencing. Toileting- Clothing Manipulation and Hygiene: Sit to/from stand;Minimal assistance               Vision Baseline Vision/History: 1 Wears glasses Vision Assessment?: No apparent visual deficits            Pertinent Vitals/Pain Pain  Assessment Pain Assessment: No/denies pain     Extremity/Trunk Assessment Upper Extremity Assessment Upper Extremity  Assessment: Overall WFL for tasks assessed   Lower Extremity Assessment Lower Extremity Assessment: Defer to PT evaluation          Cognition Arousal: Alert Behavior During Therapy: [facility] for tasks assessed/performed               General Comments: cooperative and plesant during session. patient reported today was sunday feb 1925.                Home Living Family/patient expects to be discharged to:: Private residence Living Arrangements: Children (son) Available Help at Discharge: Family;Available PRN/intermittently Type of Home: House Home Access: Stairs to enter [employer] of Steps: "a few"   Home Layout: One level     Bathroom Shower/Tub: Tub/shower unit         Home Equipment: None;Shower seat          Prior Functioning/Environment Prior Level of Function : Independent/Modified Independent                        OT Problem List: Impaired balance (sitting and/or standing);Decreased cognition;Decreased knowledge of use of DME or AE;Decreased activity tolerance;Decreased safety awareness         OT Goals(Current goals can be found in the care plan section) Acute Rehab OT Goals Patient Stated Goal: to get better OT Goal Formulation: With patient Time For Goal Achievement: 05/31/23 Potential to Achieve Goals: Fair  OT Frequency:         AM-PAC OT "6 Clicks" Daily Activity     Outcome Measure Help from another person eating meals?: A Little Help from another person taking care of personal grooming?: A Little Help from another person toileting, which includes using toliet, bedpan, or urinal?: A Little Help from another person bathing (including washing, rinsing, drying)?: A Little Help from another person to put on and taking off regular upper body clothing?: A Little Help from another person to put on and taking off regular lower body clothing?: A Lot 6 Click Score: 17   End of Session Equipment Utilized During Treatment:  Rolling walker (2 wheels) Nurse Communication: Mobility status;Other (comment) (patients poor problemsolving for balance sitting EOB)  Activity Tolerance: Patient tolerated treatment well Patient left: in chair;with call bell/phone within reach;with chair alarm set  OT Visit Diagnosis: Unsteadiness on feet (R26.81);Other abnormalities of gait and mobility (R26.89);Muscle weakness (generalized) (M62.81)                Time: 2956-2130 OT Time Calculation (min): 12 min Charges:  OT General Charges $OT Visit: 1 Visit OT Evaluation $OT Eval Low Complexity: 1 Low  Monteen Toops OTR/L, MS Acute Rehabilitation Department Office# [phone]   Selinda Flavin 05/17/2023, 11:11 AM

## 2023-05-17 NOTE — TOC Transition Note (Signed)
Transition of Care [facility]) - Discharge Note   Patient Details  Name: [name] MRN: [id number] Date of Birth: 02/11/1935  Transition of Care [facility]) CM/SW Contact:  Larrie Kass, LCSW Phone Number: 05/17/2023, 12:01 PM   Clinical Narrative:     CSW met with the pt and her family to discuss recommendations for home health services. Pt agreed with no preference for a home health agency. Patient is requesting a rolling walker but has no preference for a DME company. CSW sent a referral to [employer] for the walker to be delivered to the pt's room prior to discharge.  [facility] has accepted the pt for HHPT/OT, and Adie. Pt's family will provide transportation assistance home. No further TOC needs; TOC sign-off.    Barriers to Discharge: Barriers Resolved   Patient Goals and CMS Choice Patient states their goals for this hospitalization and ongoing recovery are:: return home with home healt CMS Medicare.gov Compare Post Acute Care list provided to:: Patient Choice offered to / list presented to : Patient      Discharge Placement                       Discharge Plan and Services Additional resources added to the After Visit Summary for                            [facility] Arranged: PT, OT HH Agency: [facility] Date [facility] Agency Contacted: 05/17/23 Time HH Agency Contacted: 1201 Representative spoke with at [facility] Agency: [facility]  Social Drivers of Health (SDOH) Interventions SDOH Screenings   Food Insecurity: Patient Declined (05/16/2023)  Housing: Patient Declined (05/16/2023)  Transportation Needs: Patient Declined (05/16/2023)  Utilities: Patient Declined (05/16/2023)  Alcohol Screen: Low Risk  (03/23/2023)  Depression (PHQ2-9): Low Risk  (03/23/2023)  Financial Resource Strain: Medium Risk (03/23/2023)  Physical Activity: Inactive (03/23/2023)  Social Connections: Patient Declined (05/16/2023)  Recent Concern: Social Connections - Moderately Isolated (03/23/2023)   Stress: No Stress Concern Present (03/23/2023)  Tobacco Use: Low Risk  (05/14/2023)  Health Literacy: Inadequate Health Literacy (03/23/2023)     Readmission Risk Interventions     No data to display

## 2023-05-17 NOTE — Plan of Care (Cosign Needed)
   Problem: Education: Goal: Knowledge of General Education information will improve Description: Including pain rating scale, medication(s)/side effects and non-pharmacologic comfort measures Outcome: Progressing   Problem: Activity: Goal: Risk for activity intolerance will decrease Outcome: Progressing   Problem: Nutrition: Goal: Adequate nutrition will be maintained Outcome: Progressing

## 2023-05-17 NOTE — Progress Notes (Signed)
PHARMACY - ANTICOAGULATION CONSULT NOTE  Pharmacy Consult for Warfarin Indication: atrial fibrillation  Allergies  Allergen Reactions   Acetaminophen-Codeine     Made me depressed   Clarithromycin     REACTION: unspecified   Fosamax [Alendronate Sodium]     achy   Guaifenesin Er     itching   Metformin     REACTION: pt states INTOL to Metformin "felt drained"   Prandin [Repaglinide]     Prandin dropped CBGs to 66 once - she stopped it.   Tape     Adhesive tape---rash    Patient Measurements: Height: 5\' 6"  (167.6 cm) Weight: 69.9 kg (154 lb 1.6 oz) IBW/kg (Calculated) : 59.3  Vital Signs: Temp: 98.9 F (37.2 C) (02/02 0604) BP: 127/69 (02/02 0604) Pulse Rate: 71 (02/02 0604)  Labs: Recent Labs    05/14/23 2008 05/14/23 2329 05/15/23 0222 05/15/23 1741 05/16/23 0500 05/17/23 0335  HGB 13.1  --   --   --  12.6 12.3  HCT 40.1  --   --   --  38.3 38.1  PLT 213  --   --   --  199 190  LABPROT  --  26.7*  --   --  25.0* 25.1*  INR  --  2.4*  --   --  2.2* 2.2*  CREATININE 0.78  --    < > 0.93 1.01* 0.97  TROPONINIHS 8 11  --   --   --   --    < > = values in this interval not displayed.    Estimated Creatinine Clearance: 39 mL/min (by C-G formula based on SCr of 0.97 mg/dL).   Medical History: Past Medical History:  Diagnosis Date   Anxiety state, unspecified    Bronchitis, not specified as acute or chronic    Diaphragmatic hernia without mention of obstruction or gangrene    Dyslipidemia    Essential hypertension    Irritable bowel syndrome    Mitral valve disorders(424.0)    a. 08/2010 Echo: EF >55%, mild MR, mod TR, trace AI.   Non-obstructive CAD    a. 07/2010 Lexiscan MV: no ischemia, EF 78%; b. 07/2010 Cath: LM nl, LAD50/60p, 40 apical, LCX nl, RCA dominant, 30/40p, 59m.   Osteoarthrosis, unspecified whether generalized or localized, unspecified site    Osteoporosis, unspecified    Other abnormal glucose    PAF (paroxysmal atrial fibrillation) (HCC)     a. CHA2DS2VASc = 4-->coumadin.   Phlebitis and thrombophlebitis of superficial vessels of lower extremities    PSVT (paroxysmal supraventricular tachycardia) (HCC)    a. 05/2012 Holter: short bursts of PSVT noted-->Managed with toprol.   Unspecified venous (peripheral) insufficiency    Unspecified vitamin D deficiency     Assessment: 33 y/oF with PMH of paroxysmal atrial fibrillation on warfarin PTA admitted with hyponatremia. Pharmacy consulted to manage warfarin while patient admitted. PTA med list awaiting completion, but per anti-coag visit note on 04/28/23, warfarin regimen is 5mg  PO daily. INR 2.4 on admission. Hgb, Plt WNL.   Today, 05/17/23 INR is 2.2, therapeutic  Hgb is 12.3, plt 190 No meal intake charted  One meal charted as 100% eaten on 2/1    Goal of Therapy:  INR 2-3 Monitor platelets by anticoagulation protocol: Yes   Plan:  Warfarin 5mg  PO x 1 Daily PT/INR Monitor CBC and for s/sx of bleeding   Adalberto Cole, PharmD, BCPS 05/17/2023 10:02 AM

## 2023-05-17 NOTE — Plan of Care (Signed)
  Problem: Clinical Measurements: Goal: Ability to maintain clinical measurements within normal limits will improve Outcome: Progressing   Problem: Activity: Goal: Risk for activity intolerance will decrease Outcome: Progressing   Problem: Coping: Goal: Level of anxiety will decrease Outcome: Progressing   Problem: Pain Managment: Goal: General experience of comfort will improve and/or be controlled Outcome: Progressing   Problem: Safety: Goal: Ability to remain free from injury will improve Outcome: Progressing

## 2023-05-17 NOTE — Discharge Summary (Signed)
Physician Discharge Summary  [name] LKG:[id number] DOB: [birth year] DOA: 05/14/2023  PCP: Tresa Garter, MD  Admit date: 05/14/2023 Discharge date: 05/17/2023  Admitted From: Home Disposition: Home Home  Recommendations for Outpatient Follow-up:  Follow up with PCP in 1-2 weeks Please obtain BMP/CBC in one week your next doctors visit.     Discharge Condition: Stable CODE STATUS: Full code Diet recommendation: Low-salt  Brief/Interim Summary: Brief Narrative:   89 year old with history of anxiety, depression, HLD, HTN, paroxysmal A-fib admitted for hyponatremia.  Patient reporting of nonproductive cough, body aches for couple of days.  Upon admission noted to have hyponatremia.  Overnight patient was hydrated, hyponatremia resolved.  Generalized body aches and fevers also subsided.  All the infectious workup including respiratory panel were negative.  Today patient is medically stable for discharge.  PT OT recommending home health therefore arrangements being made Spoke with patient's daughter at bedside as well.  Assessment & Plan:  Principal Problem:   Hyponatremia   Hyponatremia; hypovolemic, resolved Admission Na 126.  With IV fluids hyponatremia essentially has resolved.  Tolerating oral at this point.  Sodium is 131 today.    Generalized body aches - Resolved.  Although viral workup is negative.  No evidence of infection  Essential hypertension -Norvasc, Imdur, Toprol-XL.  IV as needed  Paroxysmal atrial fibrillation -Toprol-XL, resume home Coumadin.  Discharge INR 2.2  Anxiety/depression - P.o. and Xanax  Hyperlipidemia -Zocor  GERD -PPI   DVT prophylaxis: SCDs Start: 05/15/23 0836    Code Status: Full Code Family Communication: Family at bedside Status is: Inpatient Remains inpatient appropriate because: Discharge  Subjective: Doing well no complaints.  Wishing to go home.  Examination:  General exam: Appears calm and comfortable   Respiratory system: Clear to auscultation. Respiratory effort normal. Cardiovascular system: S1 & S2 heard, RRR. No JVD, murmurs, rubs, gallops or clicks. No pedal edema. Gastrointestinal system: Abdomen is nondistended, soft and nontender. No organomegaly or masses felt. Normal bowel sounds heard. Central nervous system: Alert and oriented. No focal neurological deficits. Extremities: Symmetric 5 x 5 power. Skin: No rashes, lesions or ulcers Psychiatry: Judgement and insight appear normal. Mood & affect appropriate.    Discharge Diagnoses:  Principal Problem:   Hyponatremia      Discharge Exam: Vitals:   05/16/23 2140 05/17/23 0604  BP: (!) 149/74 127/69  Pulse: 89 71  Resp: 16 16  Temp: 99.6 F (37.6 C) 98.9 F (37.2 C)  SpO2: 95% 94%   Vitals:   05/16/23 1307 05/16/23 1733 05/16/23 2140 05/17/23 0604  BP: (!) 137/93 (!) 150/86 (!) 149/74 127/69  Pulse: 67 72 89 71  Resp: 19 17 16 16   Temp: 99 F (37.2 C) 98.8 F (37.1 C) 99.6 F (37.6 C) 98.9 F (37.2 C)  TempSrc:   Oral   SpO2: 95% 92% 95% 94%  Weight: 69.9 kg     Height: 5\' 6"  (1.676 m)         Discharge Instructions   Allergies as of 05/17/2023       Reactions   Acetaminophen-codeine    Made me depressed   Clarithromycin    REACTION: unspecified   Fosamax [alendronate Sodium]    achy   Guaifenesin Er    itching   Metformin    REACTION: pt states INTOL to Metformin "felt drained"   Prandin [repaglinide]    Prandin dropped CBGs to 66 once - she stopped it.   Tape    Adhesive tape---rash  Medication List     TAKE these medications    acetaminophen 500 MG tablet Commonly known as: TYLENOL Take 500 mg by mouth every 6 (six) hours as needed.   ALPRAZolam 0.5 MG tablet Commonly known as: XANAX TAKE 1/2 TO 1 TABLET BY MOUTH THREE TIMES DAILY AS NEEDED FOR ANXIETY. DO NOT TAKE WITH TRAMADOL. THIS IS A 30 DAY SUPPLY What changed: See the new instructions.   amLODipine 5 MG  tablet Commonly known as: NORVASC Take 1 tablet (5 mg total) by mouth daily.   aspirin 81 MG tablet Take 81 mg by mouth daily.   diclofenac Sodium 1 % Gel Commonly known as: Voltaren Apply 2 g topically 4 (four) times daily.   ezetimibe 10 MG tablet Commonly known as: ZETIA Take 10 mg by mouth daily.   furosemide 20 MG tablet Commonly known as: LASIX TAKE 1 TABLET(20 MG) BY MOUTH DAILY AS NEEDED FOR SWELLING What changed: See the new instructions.   hydroxypropyl methylcellulose / hypromellose 2.5 % ophthalmic solution Commonly known as: ISOPTO TEARS / GONIOVISC Place 1 drop into both eyes 2 (two) times daily.   hyoscyamine 0.125 MG tablet Commonly known as: LEVSIN TAKE 1 TABLET BY MOUTH EVERY 6 HOURS AS NEEDED FOR UPTO 10 DAYS FOR CRAMPING   isosorbide mononitrate 30 MG 24 hr tablet Commonly known as: IMDUR Take 1 tablet (30 mg total) by mouth daily.   metoprolol succinate 50 MG 24 hr tablet Commonly known as: TOPROL-XL TAKE 1 1/2 tables in the morning and take 1 1/2 tablet in the evening.   pantoprazole 40 MG tablet Commonly known as: PROTONIX TAKE 1 TABLET BY MOUTH EVERY DAY   simvastatin 20 MG tablet Commonly known as: ZOCOR Take 1 tablet (20 mg total) by mouth at bedtime.   Vitamin D 50 MCG (2000 UT) Caps TAKE 1 CAPSULE BY MOUTH EVERY DAY   warfarin 5 MG tablet Commonly known as: COUMADIN Take as directed. If you are unsure how to take this medication, talk to your nurse or doctor. Original instructions: TAKE 1/2 TO 1 TABLET BY MOUTH DAILY AS DIRECTED BY COUMADIN CLINIC What changed: See the new instructions.               Durable Medical Equipment  (From admission, onward)           Start     Ordered   05/17/23 1155  For home use only DME Walker  Once       Question:  Patient needs a walker to treat with the following condition  Answer:  Ambulatory dysfunction   05/17/23 1154   05/16/23 1656  For home use only DME Walker rolling  Once        Question Answer Comment  Walker: With 5 Inch Wheels   Patient needs a walker to treat with the following condition Difficulty in walking, not elsewhere classified      05/16/23 1656            Follow-up Information     Plotnikov, Georgina Quint, MD Follow up in 1 week(s).   Specialty: Internal Medicine Contact information: [address] [place] [address] [phone]                Allergies  Allergen Reactions   Acetaminophen-Codeine     Made me depressed   Clarithromycin     REACTION: unspecified   Fosamax [Alendronate Sodium]     achy   Guaifenesin Er  itching   Metformin     REACTION: pt states INTOL to Metformin "felt drained"   Prandin [Repaglinide]     Prandin dropped CBGs to 66 once - she stopped it.   Tape     Adhesive tape---rash    You were cared for by a hospitalist during your hospital stay. If you have any questions about your discharge medications or the care you received while you were in the hospital after you are discharged, you can call the unit and asked to speak with the hospitalist on call if the hospitalist that took care of you is not available. Once you are discharged, your primary care physician will handle any further medical issues. Please note that no refills for any discharge medications will be authorized once you are discharged, as it is imperative that you return to your primary care physician (or establish a relationship with a primary care physician if you do not have one) for your aftercare needs so that they can reassess your need for medications and monitor your lab values.  You were cared for by a hospitalist during your hospital stay. If you have any questions about your discharge medications or the care you received while you were in the hospital after you are discharged, you can call the unit and asked to speak with the hospitalist on call if the hospitalist that took care of you is not available. Once you are  discharged, your primary care physician will handle any further medical issues. Please note that NO REFILLS for any discharge medications will be authorized once you are discharged, as it is imperative that you return to your primary care physician (or establish a relationship with a primary care physician if you do not have one) for your aftercare needs so that they can reassess your need for medications and monitor your lab values.  Please request your Prim.MD to go over all Hospital Tests and Procedure/Radiological results at the follow up, please get all Hospital records sent to your Prim MD by signing hospital release before you go home.  Get CBC, CMP, 2 view Chest X ray checked  by Primary MD during your next visit or SNF MD in 5-7 days ( we routinely change or add medications that can affect your baseline labs and fluid status, therefore we recommend that you get the mentioned basic workup next visit with your PCP, your PCP may decide not to get them or add new tests based on their clinical decision)  On your next visit with your primary care physician please Get Medicines reviewed and adjusted.  If you experience worsening of your admission symptoms, develop shortness of breath, life threatening emergency, suicidal or homicidal thoughts you must seek medical attention immediately by calling 911 or calling your MD immediately  if symptoms less severe.  You Must read complete instructions/literature along with all the possible adverse reactions/side effects for all the Medicines you take and that have been prescribed to you. Take any new Medicines after you have completely understood and accpet all the possible adverse reactions/side effects.   Do not drive, operate heavy machinery, perform activities at heights, swimming or participation in water activities or provide baby sitting services if your were admitted for syncope or siezures until you have seen by Primary MD or a Neurologist and advised  to do so again.  Do not drive when taking Pain medications.   Procedures/Studies: US ABDOMEN LIMITED RUQ (LIVER/GB) Result Date: 05/15/2023 CLINICAL DATA:  89 year old female with history of  upper abdominal pain. EXAM: ULTRASOUND ABDOMEN LIMITED RIGHT UPPER QUADRANT COMPARISON:  No prior abdominal ultrasound. CT of the abdomen and pelvis 05/15/2023. FINDINGS: Gallbladder: Numerous echogenic foci lying dependently in the gallbladder, some of which demonstrate posterior acoustic shadowing, compatible with a combination of biliary sludge and small gallstones measuring up to 7 mm. Gallbladder is moderately distended. Gallbladder wall thickness is normal at 3 mm. No pericholecystic fluid. Per report from the sonographer, there was no sonographic Murphy's sign on examination. Common bile duct: Diameter: 4 mm. Liver: No focal lesion identified. Within normal limits in parenchymal echogenicity. Portal vein is patent on color Doppler imaging with normal direction of blood flow towards the liver. Other: 2.4 x 2.9 x 2.0 cm hypoechoic lesion in the upper right retroperitoneum corresponds to right adrenal nodule as demonstrated on recent CT examination (previously characterized as an adrenal adenoma). Multiple cysts in the right kidney incidentally noted. IMPRESSION: 1. Cholelithiasis and biliary sludge in the gallbladder, without definitive sonographic evidence of acute cholecystitis at this time. Electronically Signed   By: Trudie Reed M.D.   On: 05/15/2023 07:10   CT ABDOMEN PELVIS W CONTRAST Result Date: 05/15/2023 CLINICAL DATA:  Acute, nonlocalized abdominal pain. EXAM: CT ABDOMEN AND PELVIS WITH CONTRAST TECHNIQUE: Multidetector CT imaging of the abdomen and pelvis was performed using the standard protocol following bolus administration of intravenous contrast. RADIATION DOSE REDUCTION: This exam was performed according to the departmental dose-optimization program which includes automated exposure control,  adjustment of the mA and/or kV according to patient size and/or use of iterative reconstruction technique. CONTRAST:  OMNIPAQUE IOHEXOL 300 MG/ML  SOLN COMPARISON:  03/28/2021 abdominal MRI FINDINGS: Lower chest: Atheromatous calcification of the aorta and coronaries. Atelectasis at the right lung base. Hepatobiliary: No focal liver abnormality by CT, reference prior MRI.Distended gallbladder with layering calculi. There is some right upper quadrant inflammation with fat stranding, favored to be biliary. No ductal dilatation or ductal stone identified. Pancreas: Generalized atrophy Spleen: Unremarkable. Adrenals/Urinary Tract: History of right adrenal adenoma with stable size measuring 2.4 cm. No hydronephrosis or stone. Multiple bilateral renal cortical cysts. Known solid and cystic mass in the upper pole left kidney measuring 14 mm, previously 11. Unremarkable bladder. Stomach/Bowel: No obstruction. No visible bowel inflammation, including in the colon adjacent to the right upper quadrant fat stranding. Vascular/Lymphatic: No acute vascular abnormality. Multifocal atheromatous calcification of the aorta and iliacs. No mass or adenopathy. Reproductive:Hysterectomy Other: No ascites or pneumoperitoneum. Musculoskeletal: No acute abnormalities. Chronic L1 superior endplate fracture. Lumbar spine degeneration with L4-5 mild anterolisthesis and disc bulging causing foraminal stenosis greater on the right. IMPRESSION: 1. Cholelithiasis, gallbladder distension, and regional fat inflammation concerning for acute cholecystitis. Recommend ultrasound. 2. 14 mm enhancing left renal mass has mildly enlarged from a 2020 MRI, suspected renal cell carcinoma. 3. Other chronic findings are stable. Electronically Signed   By: Tiburcio Pea M.D.   On: 05/15/2023 06:06   DG Chest 2 View Result Date: 05/14/2023 CLINICAL DATA:  Chest pain EXAM: CHEST - 2 VIEW COMPARISON:  10/11/2022 FINDINGS: Cardiac shadow is stable. Lungs  are well aerated bilaterally. No focal infiltrate or effusion is seen. No bony abnormality is noted. IMPRESSION: No acute abnormality noted. Electronically Signed   By: Alcide Clever M.D.   On: 05/14/2023 21:25     The results of significant diagnostics from this hospitalization (including imaging, microbiology, ancillary and laboratory) are listed below for reference.     Microbiology: Recent Results (from the past 240 hours)  Resp panel by RT-PCR (RSV, Flu A&B, Covid) Anterior Nasal Swab     Status: None   Collection Time: 05/15/23 12:54 AM   Specimen: Anterior Nasal Swab  Result Value Ref Range Status   SARS Coronavirus 2 by RT PCR NEGATIVE NEGATIVE Final    Comment: (NOTE) SARS-CoV-2 target nucleic acids are NOT DETECTED.  The SARS-CoV-2 RNA is generally detectable in upper respiratory specimens during the acute phase of infection. The lowest concentration of SARS-CoV-2 viral copies this assay can detect is 138 copies/mL. A negative result does not preclude SARS-Cov-2 infection and should not be used as the sole basis for treatment or other patient management decisions. A negative result may occur with  improper specimen collection/handling, submission of specimen other than nasopharyngeal swab, presence of viral mutation(s) within the areas targeted by this assay, and inadequate number of viral copies(<138 copies/mL). A negative result must be combined with clinical observations, patient history, and epidemiological information. The expected result is Negative.  Fact Sheet for Patients:  BloggerCourse.com  Fact Sheet for Healthcare Providers:  SeriousBroker.it  This test is no t yet approved or cleared by the [place] FDA and  has been authorized for detection and/or diagnosis of SARS-CoV-2 by FDA under an Emergency Use Authorization (EUA). This EUA will remain  in effect (meaning this test can be used) for the duration  of the COVID-19 declaration under Section 564(b)(1) of the Act, 21 U.S.C.section 360bbb-3(b)(1), unless the authorization is terminated  or revoked sooner.       Influenza A by PCR NEGATIVE NEGATIVE Final   Influenza B by PCR NEGATIVE NEGATIVE Final    Comment: (NOTE) The Xpert Xpress SARS-CoV-2/FLU/RSV plus assay is intended as an aid in the diagnosis of influenza from Nasopharyngeal swab specimens and should not be used as a sole basis for treatment. Nasal washings and aspirates are unacceptable for Xpert Xpress SARS-CoV-2/FLU/RSV testing.  Fact Sheet for Patients: BloggerCourse.com  Fact Sheet for Healthcare Providers: SeriousBroker.it  This test is not yet approved or cleared by the [place] FDA and has been authorized for detection and/or diagnosis of SARS-CoV-2 by FDA under an Emergency Use Authorization (EUA). This EUA will remain in effect (meaning this test can be used) for the duration of the COVID-19 declaration under Section 564(b)(1) of the Act, 21 U.S.C. section 360bbb-3(b)(1), unless the authorization is terminated or revoked.     Resp Syncytial Virus by PCR NEGATIVE NEGATIVE Final    Comment: (NOTE) Fact Sheet for Patients: BloggerCourse.com  Fact Sheet for Healthcare Providers: SeriousBroker.it  This test is not yet approved or cleared by the [place] FDA and has been authorized for detection and/or diagnosis of SARS-CoV-2 by FDA under an Emergency Use Authorization (EUA). This EUA will remain in effect (meaning this test can be used) for the duration of the COVID-19 declaration under Section 564(b)(1) of the Act, 21 U.S.C. section 360bbb-3(b)(1), unless the authorization is terminated or revoked.  Performed at [facility], [address], [place], [place] [address]   Respiratory (~20 pathogens) panel by PCR     Status: None    Collection Time: 05/16/23 12:54 AM   Specimen: Nasopharyngeal Swab; Respiratory  Result Value Ref Range Status   Adenovirus NOT DETECTED NOT DETECTED Final   Coronavirus 229E NOT DETECTED NOT DETECTED Final    Comment: (NOTE) The Coronavirus on the Respiratory Panel, DOES NOT test for the novel  Coronavirus (2019 nCoV)    Coronavirus HKU1 NOT DETECTED NOT DETECTED Final  Coronavirus NL63 NOT DETECTED NOT DETECTED Final   Coronavirus OC43 NOT DETECTED NOT DETECTED Final   Metapneumovirus NOT DETECTED NOT DETECTED Final   Rhinovirus / Enterovirus NOT DETECTED NOT DETECTED Final   Influenza A NOT DETECTED NOT DETECTED Final   Influenza B NOT DETECTED NOT DETECTED Final   Parainfluenza Virus 1 NOT DETECTED NOT DETECTED Final   Parainfluenza Virus 2 NOT DETECTED NOT DETECTED Final   Parainfluenza Virus 3 NOT DETECTED NOT DETECTED Final   Parainfluenza Virus 4 NOT DETECTED NOT DETECTED Final   Respiratory Syncytial Virus NOT DETECTED NOT DETECTED Final   Bordetella pertussis NOT DETECTED NOT DETECTED Final   Bordetella Parapertussis NOT DETECTED NOT DETECTED Final   Chlamydophila pneumoniae NOT DETECTED NOT DETECTED Final   Mycoplasma pneumoniae NOT DETECTED NOT DETECTED Final    Comment: Performed at [facility], [address] [place], [place] [address]     Labs: BNP (last 3 results) No results for input(s): "BNP" in the last 8760 hours. Basic Metabolic Panel: Recent Labs  Lab 05/14/23 2008 05/15/23 0222 05/15/23 1741 05/16/23 0500 05/17/23 0335  NA 126* 126* 126* 130* 131*  K 3.6 3.8 4.0 3.7 3.6  CL 94* 94* 93* 97* 100  CO2 19* 23 24 26 23   GLUCOSE 247* 206* 191* 154* 189*  BUN 13 14 15 14 12   CREATININE 0.78 1.02* 0.93 1.01* 0.97  CALCIUM 8.6* 8.3* 8.5* 8.5* 8.1*  MG  --   --   --   --  2.1   Liver Function Tests: Recent Labs  Lab 05/14/23 2008  AST 29  ALT 18  ALKPHOS 55  BILITOT 1.1  PROT 7.7  ALBUMIN 4.2   Recent Labs  Lab 05/14/23 2008   LIPASE 23   No results for input(s): "AMMONIA" in the last 168 hours. CBC: Recent Labs  Lab 05/14/23 2008 05/16/23 0500 05/17/23 0335  WBC 12.0* 13.3* 10.9*  NEUTROABS 10.5*  --   --   HGB 13.1 12.6 12.3  HCT 40.1 38.3 38.1  MCV 88.1 88.7 88.6  PLT 213 199 190   Cardiac Enzymes: No results for input(s): "CKTOTAL", "CKMB", "CKMBINDEX", "TROPONINI" in the last 168 hours. BNP: Invalid input(s): "POCBNP" CBG: No results for input(s): "GLUCAP" in the last 168 hours. D-Dimer No results for input(s): "DDIMER" in the last 72 hours. Hgb A1c No results for input(s): "HGBA1C" in the last 72 hours. Lipid Profile No results for input(s): "CHOL", "HDL", "LDLCALC", "TRIG", "CHOLHDL", "LDLDIRECT" in the last 72 hours. Thyroid function studies No results for input(s): "TSH", "T4TOTAL", "T3FREE", "THYROIDAB" in the last 72 hours.  Invalid input(s): "FREET3" Anemia work up No results for input(s): "VITAMINB12", "FOLATE", "FERRITIN", "TIBC", "IRON", "RETICCTPCT" in the last 72 hours. Urinalysis    Component Value Date/Time   COLORURINE YELLOW 10/29/2020 0941   APPEARANCEUR CLEAR 10/29/2020 0941   LABSPEC 1.025 10/29/2020 0941   PHURINE 5.5 10/29/2020 0941   GLUCOSEU NEGATIVE 10/29/2020 0941   HGBUR LARGE (A) 10/29/2020 0941   BILIRUBINUR NEGATIVE 10/29/2020 0941   BILIRUBINUR negative 02/28/2020 1104   KETONESUR NEGATIVE 10/29/2020 0941   PROTEINUR NEGATIVE 05/01/2020 1353   UROBILINOGEN 0.2 10/29/2020 0941   NITRITE NEGATIVE 10/29/2020 0941   LEUKOCYTESUR NEGATIVE 10/29/2020 0941   Sepsis Labs Recent Labs  Lab 05/14/23 2008 05/16/23 0500 05/17/23 0335  WBC 12.0* 13.3* 10.9*   Microbiology Recent Results (from the past 240 hours)  Resp panel by RT-PCR (RSV, Flu A&B, Covid) Anterior Nasal Swab  Status: None   Collection Time: 05/15/23 12:54 AM   Specimen: Anterior Nasal Swab  Result Value Ref Range Status   SARS Coronavirus 2 by RT PCR NEGATIVE NEGATIVE Final     Comment: (NOTE) SARS-CoV-2 target nucleic acids are NOT DETECTED.  The SARS-CoV-2 RNA is generally detectable in upper respiratory specimens during the acute phase of infection. The lowest concentration of SARS-CoV-2 viral copies this assay can detect is 138 copies/mL. A negative result does not preclude SARS-Cov-2 infection and should not be used as the sole basis for treatment or other patient management decisions. A negative result may occur with  improper specimen collection/handling, submission of specimen other than nasopharyngeal swab, presence of viral mutation(s) within the areas targeted by this assay, and inadequate number of viral copies(<138 copies/mL). A negative result must be combined with clinical observations, patient history, and epidemiological information. The expected result is Negative.  Fact Sheet for Patients:  BloggerCourse.com  Fact Sheet for Healthcare Providers:  SeriousBroker.it  This test is no t yet approved or cleared by the [place] FDA and  has been authorized for detection and/or diagnosis of SARS-CoV-2 by FDA under an Emergency Use Authorization (EUA). This EUA will remain  in effect (meaning this test can be used) for the duration of the COVID-19 declaration under Section 564(b)(1) of the Act, 21 U.S.C.section 360bbb-3(b)(1), unless the authorization is terminated  or revoked sooner.       Influenza A by PCR NEGATIVE NEGATIVE Final   Influenza B by PCR NEGATIVE NEGATIVE Final    Comment: (NOTE) The Xpert Xpress SARS-CoV-2/FLU/RSV plus assay is intended as an aid in the diagnosis of influenza from Nasopharyngeal swab specimens and should not be used as a sole basis for treatment. Nasal washings and aspirates are unacceptable for Xpert Xpress SARS-CoV-2/FLU/RSV testing.  Fact Sheet for Patients: BloggerCourse.com  Fact Sheet for Healthcare  Providers: SeriousBroker.it  This test is not yet approved or cleared by the [place] FDA and has been authorized for detection and/or diagnosis of SARS-CoV-2 by FDA under an Emergency Use Authorization (EUA). This EUA will remain in effect (meaning this test can be used) for the duration of the COVID-19 declaration under Section 564(b)(1) of the Act, 21 U.S.C. section 360bbb-3(b)(1), unless the authorization is terminated or revoked.     Resp Syncytial Virus by PCR NEGATIVE NEGATIVE Final    Comment: (NOTE) Fact Sheet for Patients: BloggerCourse.com  Fact Sheet for Healthcare Providers: SeriousBroker.it  This test is not yet approved or cleared by the [place] FDA and has been authorized for detection and/or diagnosis of SARS-CoV-2 by FDA under an Emergency Use Authorization (EUA). This EUA will remain in effect (meaning this test can be used) for the duration of the COVID-19 declaration under Section 564(b)(1) of the Act, 21 U.S.C. section 360bbb-3(b)(1), unless the authorization is terminated or revoked.  Performed at [facility], [address], [place], [place] [address]   Respiratory (~20 pathogens) panel by PCR     Status: None   Collection Time: 05/16/23 12:54 AM   Specimen: Nasopharyngeal Swab; Respiratory  Result Value Ref Range Status   Adenovirus NOT DETECTED NOT DETECTED Final   Coronavirus 229E NOT DETECTED NOT DETECTED Final    Comment: (NOTE) The Coronavirus on the Respiratory Panel, DOES NOT test for the novel  Coronavirus (2019 nCoV)    Coronavirus HKU1 NOT DETECTED NOT DETECTED Final   Coronavirus NL63 NOT DETECTED NOT DETECTED Final   Coronavirus OC43 NOT DETECTED NOT  DETECTED Final   Metapneumovirus NOT DETECTED NOT DETECTED Final   Rhinovirus / Enterovirus NOT DETECTED NOT DETECTED Final   Influenza A NOT DETECTED NOT DETECTED Final   Influenza B  NOT DETECTED NOT DETECTED Final   Parainfluenza Virus 1 NOT DETECTED NOT DETECTED Final   Parainfluenza Virus 2 NOT DETECTED NOT DETECTED Final   Parainfluenza Virus 3 NOT DETECTED NOT DETECTED Final   Parainfluenza Virus 4 NOT DETECTED NOT DETECTED Final   Respiratory Syncytial Virus NOT DETECTED NOT DETECTED Final   Bordetella pertussis NOT DETECTED NOT DETECTED Final   Bordetella Parapertussis NOT DETECTED NOT DETECTED Final   Chlamydophila pneumoniae NOT DETECTED NOT DETECTED Final   Mycoplasma pneumoniae NOT DETECTED NOT DETECTED Final    Comment: Performed at [facility], [address] [place], [place] [address]     Time coordinating discharge:  I have spent 35 minutes face to face with the patient and on the ward discussing the patients care, assessment, plan and disposition with other care givers. >50% of the time was devoted counseling the patient about the risks and benefits of treatment/Discharge disposition and coordinating care.   SIGNED:   Miguel Rota, MD  [facility] 05/17/2023, 12:15 PM   If 7PM-7AM, please contact night-coverage

## 2023-05-18 ENCOUNTER — Telehealth: Payer: Self-pay

## 2023-05-18 NOTE — Transitions of Care (Post Inpatient/ED Visit) (Signed)
05/18/2023  Name: [name] MRN: [id number] DOB: Oct 07, 1934  Today's TOC FU Call Status: Today's TOC FU Call Status:: Successful TOC FU Call Completed TOC FU Call Complete Date: 05/18/23 Patient's Name and Date of Birth confirmed.  Transition Care Management Follow-up Telephone Call Date of Discharge: 05/17/23 Discharge Facility: [facility]) Type of Discharge: Inpatient Admission Primary Inpatient Discharge Diagnosis:: hypo-osmolality How have you been since you were released from the hospital?: Better Any questions or concerns?: No  Items Reviewed: Did you receive and understand the discharge instructions provided?: Yes Medications obtained,verified, and reconciled?: Yes (Medications Reviewed) Any new allergies since your discharge?: No Do you have support at home?: Yes People in Home: child(ren), adult  Medications Reviewed Today: Medications Reviewed Today     Reviewed by Kelly Addison, LPN (Licensed Practical Nurse) on 05/18/23 at 1118  Med List Status: <None>   Medication Order Taking? Sig Documenting Provider Last Dose Status Informant  acetaminophen (TYLENOL) 500 MG tablet [id number] No Take 500 mg by mouth every 6 (six) hours as needed. [provider] Past Week Active Self, Pharmacy Records, Family Member  ALPRAZolam Prudy Feeler) 0.5 MG tablet [id number] No TAKE 1/2 TO 1 TABLET BY MOUTH THREE TIMES DAILY AS NEEDED FOR ANXIETY. DO NOT TAKE WITH TRAMADOL. THIS IS A 30 DAY SUPPLY  Patient taking differently: Take 0.5 mg by mouth 3 (three) times daily as needed for anxiety.   Plotnikov, Georgina Quint, MD 05/14/2023 Active Self, Pharmacy Records, Family Member  amLODipine (NORVASC) 5 MG tablet [id number] No Take 1 tablet (5 mg total) by mouth daily. Plotnikov, Georgina Quint, MD 05/14/2023 Active Self, Pharmacy Records, Family Member  aspirin 81 MG tablet [id number] No Take 81 mg by mouth daily. [provider] 05/14/2023 Active Self, Pharmacy Records, Family Member   Cholecalciferol (VITAMIN D) 50 MCG (2000 UT) CAPS [id number] No TAKE 1 CAPSULE BY MOUTH EVERY DAY Plotnikov, Georgina Quint, MD 05/14/2023 Active Self, Pharmacy Records, Family Member  diclofenac Sodium (VOLTAREN) 1 % GEL [id number] No Apply 2 g topically 4 (four) times daily. Cristie Hem, PA-C Past Week Active Self, Pharmacy Records, Family Member  ezetimibe (ZETIA) 10 MG tablet [id number] No Take 10 mg by mouth daily.  Patient not taking: Reported on 05/16/2023   [provider] Not Taking Active Self, Pharmacy Records, Family Member           Med Note [place], Arn Medal   Fri May 15, 2023  8:15 PM)    furosemide (LASIX) 20 MG tablet [id number] No TAKE 1 TABLET(20 MG) BY MOUTH DAILY AS NEEDED FOR SWELLING  Patient taking differently: Take 20 mg by mouth daily as needed for fluid.   Chrystie Nose, MD Past Week Active Self, Pharmacy Records, Family Member  hydroxypropyl methylcellulose (ISOPTO TEARS) 2.5 % ophthalmic solution [id number] No Place 1 drop into both eyes 2 (two) times daily. [provider] 05/14/2023 Active Self, Pharmacy Records, Family Member  hyoscyamine (LEVSIN) 0.125 MG tablet [id number] No TAKE 1 TABLET BY MOUTH EVERY 6 HOURS AS NEEDED FOR UPTO 10 DAYS FOR CRAMPING Plotnikov, Georgina Quint, MD Taking Active Self, Pharmacy Records, Family Member           Med Note Erin Fulling, [facility]   Sat May 16, 2023 11:55 AM) Unknown last dose.  isosorbide mononitrate (IMDUR) 30 MG 24 hr tablet [id number] No Take 1 tablet (30 mg total) by mouth daily. Plotnikov, Georgina Quint, MD 05/14/2023 Active Self, Pharmacy Records, Family Member  metoprolol succinate (TOPROL-XL) 50 MG  24 hr tablet [id number] No TAKE 1 1/2 tables in the morning and take 1 1/2 tablet in the evening. Plotnikov, Georgina Quint, MD 05/14/2023 Active Self, Pharmacy Records, Family Member  pantoprazole (PROTONIX) 40 MG tablet [id number] No TAKE 1 TABLET BY MOUTH EVERY DAY Hilty, [name], MD 05/14/2023 Active Self, Pharmacy Records, Family  Member  simvastatin (ZOCOR) 20 MG tablet [id number] No Take 1 tablet (20 mg total) by mouth at bedtime. Joylene Grapes, NP 05/13/2023 Active Self, Pharmacy Records, Family Member  warfarin (COUMADIN) 5 MG tablet [id number] No TAKE 1/2 TO 1 TABLET BY MOUTH DAILY AS DIRECTED BY COUMADIN CLINIC  Patient taking differently: Take 5 mg by mouth daily.   Chrystie Nose, MD 05/13/2023 Active Self, Pharmacy Records, Family Member           Med Note Erin Fulling, [facility]   Sat May 16, 2023 11:59 AM) Pt states she takes 5mg  daily. Last dose 05/13/23 in the evening.  Med List Note Vela Prose 05/15/23 2033): [name] DIL [phone]            Home Care and Equipment/Supplies: Were Home Health Services Ordered?: NA Any new equipment or medical supplies ordered?: NA  Functional Questionnaire: Do you need assistance with bathing/showering or dressing?: No Do you need assistance with meal preparation?: No Do you need assistance with eating?: No Do you have difficulty maintaining continence: No Do you need assistance with getting out of bed/getting out of a chair/moving?: No Do you have difficulty managing or taking your medications?: No  Follow up appointments reviewed: PCP Follow-up appointment confirmed?: No (no avail , sent message to staff to schedule) MD Provider Line [phone] Given: No Specialist Hospital Follow-up appointment confirmed?: NA Do you need transportation to your follow-up appointment?: No Do you understand care options if your condition(s) worsen?: Yes-patient verbalized understanding    SIGNATURE Kelly Addison, LPN [facility] Nurse Health Advisor Direct Dial [phone]

## 2023-05-19 ENCOUNTER — Ambulatory Visit: Payer: Self-pay | Admitting: Internal Medicine

## 2023-05-19 NOTE — Telephone Encounter (Signed)
 Copied from CRM [id number]. Topic: Clinical - Red Word Triage >> May 19, 2023  9:24 AM Robinson DEL wrote: Red Word that prompted transfer to Nurse Triage: Patient was released from hospital 4 days ago was hospitalized from 1/30-2/02 for a virus and having some stomach pain.  Chief Complaint: abd pain Symptoms: pain in abd on right side and goes across the top Frequency: constant now but was coming and going. Pertinent Negatives: Patient denies fever, difficulty with urination and bm's. Disposition: [x] ED /[] Urgent Care (no appt availability in office) / [] Appointment(In office/virtual)/ []  [facility]/ [] Home Care/ [] Refused Recommended Disposition /[] [facility]/ []  Follow-up with PCP Additional Notes: daughter states mom started to have abd pain after discharge from hospital.  Recommendation to go to ER per protocol.  PCP office updated.   Reason for Disposition  [1] SEVERE pain AND [2] age > 60 years  Answer Assessment - Initial Assessment Questions 1. LOCATION: Where does it hurt?      On the right side, all the way across in the middle 2. RADIATION: Does the pain shoot anywhere else? (e.g., chest, back)     denies 3. ONSET: When did the pain begin? (e.g., minutes, hours or days ago)      After discharge from hospital 4. SUDDEN: Gradual or sudden onset?     gradual 5. PATTERN Does the pain come and go, or is it constant?    - If it comes and goes: How long does it last? Do you have pain now?     (Note: Comes and goes means the pain is intermittent. It goes away completely between bouts.)    - If constant: Is it getting better, staying the same, or getting worse?      (Note: Constant means the pain never goes away completely; most serious pain is constant and gets worse.)      Comes and goes 6. SEVERITY: How bad is the pain?  (e.g., Scale 1-10; mild, moderate, or severe)    - MILD (1-3): Doesn't interfere with normal activities, abdomen soft and  not tender to touch.     - MODERATE (4-7): Interferes with normal activities or awakens from sleep, abdomen tender to touch.     - SEVERE (8-10): Excruciating pain, doubled over, unable to do any normal activities.       7-8/10 7. RECURRENT SYMPTOM: Have you ever had this type of stomach pain before? If Yes, ask: When was the last time? and What happened that time?      Yes, had hurting before 8. CAUSE: What do you think is causing the stomach pain?     unknown 9. RELIEVING/AGGRAVATING FACTORS: What makes it better or worse? (e.g., antacids, bending or twisting motion, bowel movement)     denies 10. OTHER SYMPTOMS: Do you have any other symptoms? (e.g., back pain, diarrhea, fever, urination pain, vomiting)       Denies.  11. PREGNANCY: Is there any chance you are pregnant? When was your last menstrual period?       na  Protocols used: Abdominal Pain - Female-A-AH

## 2023-05-19 NOTE — Telephone Encounter (Signed)
 Copied from CRM [id number]. Topic: Clinical - Medical Advice >> May 19, 2023 10:16 AM Thersia BROCKS wrote: Reason for CRM: Patient daughter called back in, stated patient doesn't think she needs to go to the ER , wants to know if there are any other options   Patient's daughter called stating that when the patient called she told the previous nurse her pain was worse than it was and does not believe she needs to go to the ED and would like to make an appointment. Patient not with daughter at this time. I advised that based on the previous nurses evaluation that the best course would be for the patient to be evaluated in the ED. She verbalized understanding of this.     Reason for Disposition  [1] Follow-up call to recent contact AND [2] information only call, no triage required  Answer Assessment - Initial Assessment Questions 1. REASON FOR CALL or QUESTION: What is your reason for calling today? or How can I best help you? or What question do you have that I can help answer?     Patient's daughter called about earlier recommendation for the patient to go to the ED.  Protocols used: Information Only Call - No Triage-A-AH

## 2023-05-20 DIAGNOSIS — E876 Hypokalemia: Secondary | ICD-10-CM | POA: Diagnosis not present

## 2023-05-20 DIAGNOSIS — F32A Depression, unspecified: Secondary | ICD-10-CM | POA: Diagnosis not present

## 2023-05-20 DIAGNOSIS — I2510 Atherosclerotic heart disease of native coronary artery without angina pectoris: Secondary | ICD-10-CM | POA: Diagnosis not present

## 2023-05-20 DIAGNOSIS — I088 Other rheumatic multiple valve diseases: Secondary | ICD-10-CM | POA: Diagnosis not present

## 2023-05-20 DIAGNOSIS — F411 Generalized anxiety disorder: Secondary | ICD-10-CM | POA: Diagnosis not present

## 2023-05-20 DIAGNOSIS — I872 Venous insufficiency (chronic) (peripheral): Secondary | ICD-10-CM | POA: Diagnosis not present

## 2023-05-20 DIAGNOSIS — I10 Essential (primary) hypertension: Secondary | ICD-10-CM | POA: Diagnosis not present

## 2023-05-20 DIAGNOSIS — I480 Paroxysmal atrial fibrillation: Secondary | ICD-10-CM | POA: Diagnosis not present

## 2023-05-20 DIAGNOSIS — I471 Supraventricular tachycardia, unspecified: Secondary | ICD-10-CM | POA: Diagnosis not present

## 2023-05-22 ENCOUNTER — Ambulatory Visit (INDEPENDENT_AMBULATORY_CARE_PROVIDER_SITE_OTHER): Payer: Medicare HMO | Admitting: Family Medicine

## 2023-05-22 ENCOUNTER — Encounter: Payer: Self-pay | Admitting: Family Medicine

## 2023-05-22 VITALS — BP 110/84 | HR 81 | Temp 97.9°F | Ht 66.0 in | Wt 192.4 lb

## 2023-05-22 DIAGNOSIS — I480 Paroxysmal atrial fibrillation: Secondary | ICD-10-CM

## 2023-05-22 DIAGNOSIS — E871 Hypo-osmolality and hyponatremia: Secondary | ICD-10-CM | POA: Diagnosis not present

## 2023-05-22 DIAGNOSIS — I10 Essential (primary) hypertension: Secondary | ICD-10-CM | POA: Diagnosis not present

## 2023-05-22 DIAGNOSIS — I471 Supraventricular tachycardia, unspecified: Secondary | ICD-10-CM | POA: Diagnosis not present

## 2023-05-22 DIAGNOSIS — F32A Depression, unspecified: Secondary | ICD-10-CM | POA: Diagnosis not present

## 2023-05-22 DIAGNOSIS — E119 Type 2 diabetes mellitus without complications: Secondary | ICD-10-CM | POA: Diagnosis not present

## 2023-05-22 DIAGNOSIS — I088 Other rheumatic multiple valve diseases: Secondary | ICD-10-CM | POA: Diagnosis not present

## 2023-05-22 DIAGNOSIS — I2510 Atherosclerotic heart disease of native coronary artery without angina pectoris: Secondary | ICD-10-CM | POA: Diagnosis not present

## 2023-05-22 DIAGNOSIS — R109 Unspecified abdominal pain: Secondary | ICD-10-CM

## 2023-05-22 DIAGNOSIS — I872 Venous insufficiency (chronic) (peripheral): Secondary | ICD-10-CM | POA: Diagnosis not present

## 2023-05-22 DIAGNOSIS — E876 Hypokalemia: Secondary | ICD-10-CM | POA: Diagnosis not present

## 2023-05-22 DIAGNOSIS — F411 Generalized anxiety disorder: Secondary | ICD-10-CM | POA: Diagnosis not present

## 2023-05-22 LAB — CBC WITH DIFFERENTIAL/PLATELET
Basophils Absolute: 0 10*3/uL (ref 0.0–0.1)
Basophils Relative: 0.4 % (ref 0.0–3.0)
Eosinophils Absolute: 0.2 10*3/uL (ref 0.0–0.7)
Eosinophils Relative: 2.7 % (ref 0.0–5.0)
HCT: 35.7 % — ABNORMAL LOW (ref 36.0–46.0)
Hemoglobin: 11.7 g/dL — ABNORMAL LOW (ref 12.0–15.0)
Lymphocytes Relative: 19.5 % (ref 12.0–46.0)
Lymphs Abs: 1.3 10*3/uL (ref 0.7–4.0)
MCHC: 32.7 g/dL (ref 30.0–36.0)
MCV: 88 fL (ref 78.0–100.0)
Monocytes Absolute: 0.6 10*3/uL (ref 0.1–1.0)
Monocytes Relative: 9.2 % (ref 3.0–12.0)
Neutro Abs: 4.6 10*3/uL (ref 1.4–7.7)
Neutrophils Relative %: 68.2 % (ref 43.0–77.0)
Platelets: 305 10*3/uL (ref 150.0–400.0)
RBC: 4.06 Mil/uL (ref 3.87–5.11)
RDW: 13.5 % (ref 11.5–15.5)
WBC: 6.8 10*3/uL (ref 4.0–10.5)

## 2023-05-22 LAB — COMPREHENSIVE METABOLIC PANEL
ALT: 18 U/L (ref 0–35)
AST: 22 U/L (ref 0–37)
Albumin: 3.9 g/dL (ref 3.5–5.2)
Alkaline Phosphatase: 84 U/L (ref 39–117)
BUN: 10 mg/dL (ref 6–23)
CO2: 30 meq/L (ref 19–32)
Calcium: 9 mg/dL (ref 8.4–10.5)
Chloride: 97 meq/L (ref 96–112)
Creatinine, Ser: 1.12 mg/dL (ref 0.40–1.20)
GFR: 44.49 mL/min — ABNORMAL LOW (ref >60.00)
Glucose, Bld: 215 mg/dL — ABNORMAL HIGH (ref 70–99)
Potassium: 3.8 meq/L (ref 3.5–5.1)
Sodium: 136 meq/L (ref 135–145)
Total Bilirubin: 0.5 mg/dL (ref 0.2–1.2)
Total Protein: 7.1 g/dL (ref 6.0–8.3)

## 2023-05-22 LAB — HEMOGLOBIN A1C: Hgb A1c MFr Bld: 8.3 % — ABNORMAL HIGH (ref 4.6–6.5)

## 2023-05-22 MED ORDER — DICYCLOMINE HCL 10 MG PO CAPS: 10.0000 mg | ORAL_CAPSULE | Freq: Three times a day (TID) | ORAL | 0 refills | Status: AC

## 2023-05-22 NOTE — Patient Instructions (Addendum)
 I would recommend incorporating fluids with electrolytes into your daily routine.  I have sent in dicyclomine , this will replace your heart screening.  Do not take these medications together.  We are checking labs today, will be in contact with any results that require further attention  Follow-up with me for new or worsening symptoms.  Follow-up with Dr. Garald as scheduled

## 2023-05-22 NOTE — Progress Notes (Signed)
 Established Patient Office Visit  Subjective   Patient ID: [name], female    DOB: 1934-06-06  Age: 89 y.o. MRN: [id number]  Chief Complaint  Patient presents with   Hospital Follow Up     HPI Follow up Hospitalization  Patient was admitted to [facility] on 05/14/23 and discharged on 05/17/23. She was treated for hyponatremia.  Had a GI bug and was unable to drink fluids to maintain hydration. Reports that she was told that she drinks too much water and this dilutes for sodium. Treatment for this included IV fluids, oral nutrition. Telephone follow up was done on 05/18/23 She reports good compliance with treatment. She reports this condition is improved.  ----------------------------------------------------------------------------------------- -   ROS Per HPI    Objective:     BP 110/84 (BP Location: Left Arm, Patient Position: Sitting, Cuff Size: Normal)   Pulse 81   Temp 97.9 F (36.6 C) (Oral)   Ht 5' 6 (1.676 m)   Wt 192 lb 6.4 oz (87.3 kg)   SpO2 95%   BMI 31.05 kg/m   Physical Exam Vitals and nursing note reviewed.  Constitutional:      General: She is not in acute distress.    Appearance: Normal appearance. She is normal weight.     Comments: Appears fatigued  HENT:     Head: Normocephalic and atraumatic.     Nose: Nose normal.  Eyes:     Extraocular Movements: Extraocular movements intact.     Pupils: Pupils are equal, round, and reactive to light.  Cardiovascular:     Rate and Rhythm: Normal rate. Rhythm irregular.     Heart sounds: Normal heart sounds.  Pulmonary:     Effort: Pulmonary effort is normal. No respiratory distress.     Breath sounds: Normal breath sounds. No wheezing, rhonchi or rales.  Musculoskeletal:        General: Normal range of motion.     Cervical back: Normal range of motion.     Right lower leg: No edema.     Left lower leg: No edema.  Lymphadenopathy:     Cervical: No cervical adenopathy.  Skin:    General: Skin  is warm and dry.  Neurological:     General: No focal deficit present.     Mental Status: She is alert and oriented to person, place, and time.  Psychiatric:        Mood and Affect: Mood normal.        Thought Content: Thought content normal.     Results for orders placed or performed in visit on 05/22/23  CBC with Differential/Platelet  Result Value Ref Range   WBC 6.8 4.0 - 10.5 K/uL   RBC 4.06 3.87 - 5.11 Mil/uL   Hemoglobin 11.7 (L) 12.0 - 15.0 g/dL   HCT 64.2 (L) 63.9 - 53.9 %   MCV 88.0 78.0 - 100.0 fl   MCHC 32.7 30.0 - 36.0 g/dL   RDW 86.4 88.4 - 84.4 %   Platelets 305.0 150.0 - 400.0 K/uL   Neutrophils Relative % 68.2 43.0 - 77.0 %   Lymphocytes Relative 19.5 12.0 - 46.0 %   Monocytes Relative 9.2 3.0 - 12.0 %   Eosinophils Relative 2.7 0.0 - 5.0 %   Basophils Relative 0.4 0.0 - 3.0 %   Neutro Abs 4.6 1.4 - 7.7 K/uL   Lymphs Abs 1.3 0.7 - 4.0 K/uL   Monocytes Absolute 0.6 0.1 - 1.0 K/uL   Eosinophils Absolute 0.2  0.0 - 0.7 K/uL   Basophils Absolute 0.0 0.0 - 0.1 K/uL  Comprehensive metabolic panel  Result Value Ref Range   Sodium 136 135 - 145 mEq/L   Potassium 3.8 3.5 - 5.1 mEq/L   Chloride 97 96 - 112 mEq/L   CO2 30 19 - 32 mEq/L   Glucose, Bld 215 (H) 70 - 99 mg/dL   BUN 10 6 - 23 mg/dL   Creatinine, Ser 8.87 0.40 - 1.20 mg/dL   Total Bilirubin 0.5 0.2 - 1.2 mg/dL   Alkaline Phosphatase 84 39 - 117 U/L   AST 22 0 - 37 U/L   ALT 18 0 - 35 U/L   Total Protein 7.1 6.0 - 8.3 g/dL   Albumin 3.9 3.5 - 5.2 g/dL   GFR 55.50 (L) >39.99 mL/min   Calcium 9.0 8.4 - 10.5 mg/dL  HgB J8r  Result Value Ref Range   Hgb A1c MFr Bld 8.3 (H) 4.6 - 6.5 %     The ASCVD Risk score (Arnett DK, et al., 2019) failed to calculate for the following reasons:   The 2019 ASCVD risk score is only valid for ages 7 to 49    Assessment & Plan:   Hyponatremia -     Comprehensive metabolic panel  PAF (paroxysmal atrial fibrillation) (HCC) -     CBC with  Differential/Platelet  Abdominal cramping -     Dicyclomine  HCl; Take 1 capsule (10 mg total) by mouth 4 (four) times daily -  before meals and at bedtime.  Dispense: 120 capsule; Refill: 0  Type 2 diabetes mellitus without complication, without long-term current use of insulin (HCC) -     Hemoglobin A1c; Future     No follow-ups on file.    Corean Ku, FNP

## 2023-05-24 NOTE — Telephone Encounter (Signed)
 Noted! Thank you

## 2023-05-25 DIAGNOSIS — I10 Essential (primary) hypertension: Secondary | ICD-10-CM | POA: Diagnosis not present

## 2023-05-25 DIAGNOSIS — I471 Supraventricular tachycardia, unspecified: Secondary | ICD-10-CM | POA: Diagnosis not present

## 2023-05-25 DIAGNOSIS — I2510 Atherosclerotic heart disease of native coronary artery without angina pectoris: Secondary | ICD-10-CM | POA: Diagnosis not present

## 2023-05-25 DIAGNOSIS — I480 Paroxysmal atrial fibrillation: Secondary | ICD-10-CM | POA: Diagnosis not present

## 2023-05-25 DIAGNOSIS — I872 Venous insufficiency (chronic) (peripheral): Secondary | ICD-10-CM | POA: Diagnosis not present

## 2023-05-25 DIAGNOSIS — F411 Generalized anxiety disorder: Secondary | ICD-10-CM | POA: Diagnosis not present

## 2023-05-25 DIAGNOSIS — E876 Hypokalemia: Secondary | ICD-10-CM | POA: Diagnosis not present

## 2023-05-25 DIAGNOSIS — I088 Other rheumatic multiple valve diseases: Secondary | ICD-10-CM | POA: Diagnosis not present

## 2023-05-25 DIAGNOSIS — F32A Depression, unspecified: Secondary | ICD-10-CM | POA: Diagnosis not present

## 2023-05-25 MED ORDER — GLIMEPIRIDE 2 MG PO TABS: 2.0000 mg | ORAL_TABLET | Freq: Every day | ORAL | 1 refills | Status: DC

## 2023-05-25 NOTE — Addendum Note (Signed)
 Addended by: Wellington Half on: 05/25/2023 08:20 AM   Modules accepted: Orders

## 2023-05-25 NOTE — Progress Notes (Unsigned)
 Office Visit    Patient Name: [name] Date of Encounter: 05/26/2023  Primary Care Provider:  Tresa Garter, MD Primary Cardiologist:  Chrystie Nose, MD  Chief Complaint    89 year old female with a history of nonobstructive CAD, LBBB, paroxysmal atrial fibrillation, PSVT, hypertension, hyperlipidemia, type 2 diabetes, and chronic venous insufficiency who presents follow-up related to palpitations.   Past Medical History    Past Medical History:  Diagnosis Date   Anxiety state, unspecified    Bronchitis, not specified as acute or chronic    Diaphragmatic hernia without mention of obstruction or gangrene    Dyslipidemia    Essential hypertension    Irritable bowel syndrome    Mitral valve disorders(424.0)    a. 08/2010 Echo: EF >55%, mild MR, mod TR, trace AI.   Non-obstructive CAD    a. 07/2010 Lexiscan MV: no ischemia, EF 78%; b. 07/2010 Cath: LM nl, LAD50/60p, 40 apical, LCX nl, RCA dominant, 30/40p, 78m.   Osteoarthrosis, unspecified whether generalized or localized, unspecified site    Osteoporosis, unspecified    Other abnormal glucose    PAF (paroxysmal atrial fibrillation) (HCC)    a. CHA2DS2VASc = 4-->coumadin.   Phlebitis and thrombophlebitis of superficial vessels of lower extremities    PSVT (paroxysmal supraventricular tachycardia) (HCC)    a. 05/2012 Holter: short bursts of PSVT noted-->Managed with toprol.   Unspecified venous (peripheral) insufficiency    Unspecified vitamin D deficiency    Past Surgical History:  Procedure Laterality Date   CARDIAC CATHETERIZATION  07/30/2010   nonobstructive CAD, 20-40% RCA stenosis, 50-60% eccentric LAD stenosis more prox, 40% LAD stenosis more distal, EF 65%   TRANSTHORACIC ECHOCARDIOGRAM  09/03/2010   EF=>55%; mild MR; mod TR; AV mildly sclerotic with trace regurg; mild pulm valve regurg   VESICOVAGINAL FISTULA CLOSURE W/ TAH      Allergies  Allergies  Allergen Reactions   Acetaminophen-Codeine      Made me depressed   Clarithromycin     REACTION: unspecified   Fosamax [Alendronate Sodium]     achy   Guaifenesin Er     itching   Metformin     REACTION: pt states INTOL to Metformin "felt drained"   Prandin [Repaglinide]     Prandin dropped CBGs to 66 once - she stopped it.   Tape     Adhesive tape---rash     Labs/Other Studies Reviewed    The following studies were reviewed today:  Cardiac Studies & Procedures   ______________________________________________________________________________________________   STRESS TESTS  NM MYOCAR MULTI W/SPECT W 07/22/2010  Narrative *RADIOLOGY REPORT*  Clinical Data:  Chest pain  MYOCARDIAL IMAGING WITH SPECT (REST AND PHARMACOLOGIC-STRESS) GATED LEFT VENTRICULAR WALL MOTION STUDY LEFT VENTRICULAR EJECTION FRACTION  Technique:  Standard myocardial SPECT imaging was performed after resting intravenous injection of 10 mCi Tc-7m tetrofosmin. Subsequently, intravenous infusion of Lexiscan was performed under the supervision of the Cardiology staff.  At peak effect of the drug, 30 mCi Tc-54m tetrofosmin was injected intravenously and standard myocardial SPECT  imaging was performed.  Quantitative gated imaging was also performed to evaluate left ventricular wall motion, and estimate left ventricular ejection fraction.  Comparison:  None.  Findings:  Spect:  No perfusion defects.  Wall motion:  Normal.  Ejection fraction:  78%.  IMPRESSION: No perfusion defects.  Original Report Authenticated By: Donavan Burnet, M.D.   ECHOCARDIOGRAM  ECHOCARDIOGRAM COMPLETE 03/13/2022  Narrative ECHOCARDIOGRAM REPORT    Patient Name:   [name]  [place] Date of Exam: 03/13/2022 Medical Rec #:  [id number]        Height:       66.0 in Accession #:    [id number]       Weight:       189.0 lb Date of Birth:  22-Mar-1935        BSA:          1.952 m Patient Age:    87 years         BP:           128/78 mmHg Patient Gender: F                 HR:           61 bpm. Exam Location:  [address]  Procedure: 2D Echo, Cardiac Doppler, Color Doppler and 3D Echo  Indications:    Left Bundle Branch Block I44.7  History:        Patient has prior history of Echocardiogram examinations, most recent 05/19/2016. Arrythmias:Atrial Fibrillation; Risk Factors:Hypertension.  Sonographer:    Thurman Coyer RDCS Referring Phys: [id number] HAO MENG   Sonographer Comments: Global longitudinal strain was attempted. IMPRESSIONS   1. Left ventricular ejection fraction, by estimation, is 55 to 60%. Left ventricular ejection fraction by 3D volume is 57 %. The left ventricle has normal function. The left ventricle has no regional wall motion abnormalities. Left ventricular diastolic parameters are consistent with Grade I diastolic dysfunction (impaired relaxation). 2. Right ventricular systolic function is normal. The right ventricular size is normal. There is normal pulmonary artery systolic pressure. The estimated right ventricular systolic pressure is 20.3 mmHg. 3. The mitral valve is normal in structure. Trivial mitral valve regurgitation. No evidence of mitral stenosis. 4. The aortic valve is tricuspid. Aortic valve regurgitation is not visualized. Aortic valve sclerosis/calcification is present, without any evidence of aortic stenosis. 5. The inferior vena cava is normal in size with greater than 50% respiratory variability, suggesting right atrial pressure of 3 mmHg.  FINDINGS Left Ventricle: Left ventricular ejection fraction, by estimation, is 55 to 60%. Left ventricular ejection fraction by 3D volume is 57 %. The left ventricle has normal function. The left ventricle has no regional wall motion abnormalities. The left ventricular internal cavity size was normal in size. There is no left ventricular hypertrophy. Left ventricular diastolic parameters are consistent with Grade I diastolic dysfunction (impaired relaxation). Normal left  ventricular filling pressure.  Right Ventricle: The right ventricular size is normal. No increase in right ventricular wall thickness. Right ventricular systolic function is normal. There is normal pulmonary artery systolic pressure. The tricuspid regurgitant velocity is 2.08 m/s, and with an assumed right atrial pressure of 3 mmHg, the estimated right ventricular systolic pressure is 20.3 mmHg.  Left Atrium: Left atrial size was normal in size.  Right Atrium: Right atrial size was normal in size.  Pericardium: There is no evidence of pericardial effusion.  Mitral Valve: The mitral valve is normal in structure. Trivial mitral valve regurgitation. No evidence of mitral valve stenosis.  Tricuspid Valve: The tricuspid valve is normal in structure. Tricuspid valve regurgitation is not demonstrated. No evidence of tricuspid stenosis.  Aortic Valve: The aortic valve is tricuspid. Aortic valve regurgitation is not visualized. Aortic valve sclerosis/calcification is present, without any evidence of aortic stenosis.  Pulmonic Valve: The pulmonic valve was normal in structure. Pulmonic valve regurgitation is trivial. No evidence of pulmonic stenosis.  Aorta: The aortic root is normal in size and structure.  Venous: The inferior vena cava is normal in size with greater than 50% respiratory variability, suggesting right atrial pressure of 3 mmHg.  IAS/Shunts: No atrial level shunt detected by color flow Doppler.   LEFT VENTRICLE PLAX 2D LVIDd:         4.40 cm         Diastology LVIDs:         2.90 cm         LV e' medial:    4.73 cm/s LV PW:         1.00 cm         LV E/e' medial:  10.5 LV IVS:        1.00 cm         LV e' lateral:   6.22 cm/s LVOT diam:     2.00 cm         LV E/e' lateral: 8.0 LV SV:         68 LV SV Index:   35 LVOT Area:     3.14 cm        3D Volume EF LV 3D EF:    Left ventricul ar ejection fraction by 3D volume is 57 %.  3D Volume EF: 3D EF:        57 % LV  EDV:       87 ml LV ESV:       37 ml LV SV:        49 ml  RIGHT VENTRICLE RV Basal diam:  3.60 cm RV Mid diam:    3.10 cm RV S prime:     9.55 cm/s TAPSE (M-mode): 2.3 cm  LEFT ATRIUM             Index        RIGHT ATRIUM           Index LA diam:        3.30 cm 1.69 cm/m   RA Area:     14.20 cm LA Vol (A2C):   57.8 ml 29.61 ml/m  RA Volume:   34.60 ml  17.72 ml/m LA Vol (A4C):   46.8 ml 23.95 ml/m LA Biplane Vol: 57.2 ml 29.30 ml/m AORTIC VALVE LVOT Vmax:   90.00 cm/s LVOT Vmean:  59.600 cm/s LVOT VTI:    0.217 m  AORTA Ao Root diam: 2.80 cm Ao Asc diam:  3.40 cm  MITRAL VALVE               TRICUSPID VALVE MV Area (PHT): 2.66 cm    TR Peak grad:   17.3 mmHg MV Decel Time: 285 msec    TR Vmax:        208.00 cm/s MV E velocity: 49.80 cm/s MV A velocity: 77.70 cm/s  SHUNTS MV E/A ratio:  0.64        Systemic VTI:  0.22 m Systemic Diam: 2.00 cm  Armanda Magic MD Electronically signed by Armanda Magic MD Signature Date/Time: 03/13/2022/11:20:36 AM    Final    MONITORS  LONG TERM MONITOR (3-14 DAYS) 02/23/2023  Narrative Patch Wear Time:  13 days and 23 hours (2024-10-19T14:47:18-0400 to 2024-11-02T14:47:14-0400)  Patient had a min HR of 32 bpm, max HR of 176 bpm, and avg HR of 60 bpm. Predominant underlying rhythm was Sinus Rhythm. First Degree AV Block was present. Bundle Branch Block/IVCD was present. 186 Supraventricular Tachycardia runs occurred, the run with the fastest interval lasting 4 beats with a max rate of 176 bpm,  the longest lasting 11.6 secs with an avg rate of 112 bpm. Isolated SVEs were frequent (16.0%, 180370), SVE Couplets were rare (<1.0%, 5518), and SVE Triplets were rare (<1.0%, 1800). Isolated VEs were rare (<1.0%), and no VE Couplets or VE Triplets were present. MD notification criteria for Symptomatic Bradycardia met - report posted prior to notification per account request (AP).  Sinus rhythm with 186 runs of SVT, lasting up to 11 seconds.  Frequent PAC burden of 16%.  Chrystie Nose, MD, [facility], FACP [place]  [facility] Medical Director of the [facility] Lipid [facility] Cardiovascular Risk [facility] Diplomate of the American Board of Clinical Lipidology Attending Cardiologist Direct Dial: [phone]  Fax: [phone] Website:  [email or web address]       ______________________________________________________________________________________________     Recent Labs: 10/22/2022: TSH 1.660 05/17/2023: Magnesium 2.1 05/22/2023: ALT 18; BUN 10; Creatinine, Ser 1.12; Hemoglobin 11.7; Platelets 305.0; Potassium 3.8; Sodium 136  Recent Lipid Panel    Component Value Date/Time   CHOL 163 11/27/2022 1544   CHOL 161 09/26/2022 0931   TRIG 149.0 11/27/2022 1544   HDL 57.30 11/27/2022 1544   HDL 68 09/26/2022 0931   CHOLHDL 3 11/27/2022 1544   VLDL 29.8 11/27/2022 1544   LDLCALC 76 11/27/2022 1544   LDLCALC 75 09/26/2022 0931   LDLDIRECT 105.5 11/26/2010 0905    History of Present Illness    89 year old female with the above past medical history including nonobstructive CAD, LBBB, paroxysmal atrial fibrillation, PSVT, hypertension, hyperlipidemia, type 2 diabetes, and chronic venous insufficiency.   Myoview in the setting of chest pain in April 2012 showed EF 78%, no evidence of ischemia.  Due to persistent symptoms she eventually underwent cardiac catheterization in April 2012 that showed normal left main, 50 to 60% LAD stenosis, 40% apical lesion, normal left circumflex, 30 to 40% proximal RCA stenosis.  Echocardiogram at that time showed EF greater than 55%, mild aortic insufficiency, aneurysmal atrial septum. Cardiac monitor in 2020 showed paroxysmal atrial fibrillation, low burden.  Repeat cardiac monitor in 06/2020 in the setting of palpitations despite titration of beta-blocker therapy revealed PSVT.  Additionally, she has a history of LBBB.  Echocardiogram in 02/2022 showed EF 55 to 60%, normal LV function,  no RWMA, normal RV, no significant valvular abnormalities.  She was evaluated in the ED on 10/11/2022 and 10/19/2022 in the setting of palpitations. She was evaluated in the ED  in 01/2023 in the setting of palpitations.  EKG showed sinus rhythm.  Labs were unremarkable, troponin was negative x 2.  Repeat cardiac monitor revealed predominantly sinus rhythm, first-degree AV block/BBB/IVCD, 186 runs of SVT, longest lasting 11.6 seconds, frequent PACs (16%) and rare PVCs. She was last seen in the office on 03/05/2023 and was stable from a cardiac standpoint.  She noted intermittent palpitations, she denied symptoms concerning for angina.  She was hospitalized from 05/14/2023 to 05/17/2023 in the setting of hyponatremia, she improved with IV fluids.    She presents today for follow-up. Since her last visit and since her recent hospitalization she has been stable from a cardiac standpoint.  She continues to note intermittent palpitations, she will take an additional half tablet of her metoprolol and her symptoms improved.  She will take it approximately 1-2 times a week.  She has stable nonpitting bilateral lower extremity edema, well-controlled on as needed Lasix.  She denies chest pain, dyspnea, PND, orthopnea, weight gain.  Denies dizziness, presyncope or syncope.  She did not notice much improvement with increased  metoprolol.  Will continue metoprolol succinate at current dose.  Will provide a prescription for metoprolol tartrate 25 mg twice daily as needed for palpitations.  Continue to monitor symptoms.  Follow-up in 4 to 6 months with Dr. Rennis Golden.  1. Palpitations/PSVT/PACs/Paroxysmal atrial fibrillation: Cardiac monitor in 2020 showed paroxysmal atrial fibrillation, low burden. Repeat cardiac monitor in 06/2020 in the setting of palpitations despite titration of beta-blocker therapy revealed PSVT.  Echo in 02/2022 was stable.  Repeat cardiac monitor in 02/2023 revealed predominantly sinus rhythm, first-degree AV  block/BBB/IVCD, 186 runs of SVT, longest lasting 11.6 seconds, frequent PACs (16%) and rare PVCs .Discussed monitor results with Dr. Rennis Golden, who recommended increasing beta-blocker as tolerated.  Will have her take metoprolol succinate 100 mg in the morning and metoprolol succinate 75 mg in the evening.  Continue to monitor HR/symptoms. She is high risk for recurrent atrial fibrillation given high incidence of atrial ectopy.  Continue warfarin.   2. Nonobstructive CAD/LBBB: Cardiac catheterization in April 2012 showed normal left main, 50 to 60% LAD stenosis, 40% apical lesion, normal left circumflex, 30 to 40% proximal RCA stenosis. Echocardiogram in 02/2022 showed EF 55 to 60%, normal LV function, no RWMA, normal RV, no significant valvular abnormalities.  She notes some mild fatigue, otherwise stable with no anginal symptoms. No indication for ischemic evaluation.  Continue aspirin, amlodipine, metoprolol, Imdur, simvastatin, Zetia.   3. Hypertension: BP well controlled. Continue current antihypertensive regimen.    4. Hyperlipidemia: LDL was 76 in 11/2022.  She apparently did not tolerate a higher dose of simvastatin. Continue simvastatin, Zetia.     5. Type 2 diabetes: A1c was 7.2 in 09/2022. Monitored and managed per PCP.    6. Disposition: Follow-up in   Home Medications    Current Outpatient Medications  Medication Sig Dispense Refill   acetaminophen (TYLENOL) 500 MG tablet Take 500 mg by mouth every 6 (six) hours as needed.     ALPRAZolam (XANAX) 0.5 MG tablet TAKE 1/2 TO 1 TABLET BY MOUTH THREE TIMES DAILY AS NEEDED FOR ANXIETY. DO NOT TAKE WITH TRAMADOL. THIS IS A 30 DAY SUPPLY (Patient taking differently: Take 0.5 mg by mouth 3 (three) times daily as needed for anxiety.) 90 tablet 1   amLODipine (NORVASC) 5 MG tablet Take 1 tablet (5 mg total) by mouth daily. 90 tablet 3   aspirin 81 MG tablet Take 81 mg by mouth daily.     Cholecalciferol (VITAMIN D) 50 MCG (2000 UT) CAPS TAKE 1 CAPSULE  BY MOUTH EVERY DAY 100 capsule 3   diclofenac Sodium (VOLTAREN) 1 % GEL Apply 2 g topically 4 (four) times daily. 150 g 1   dicyclomine (BENTYL) 10 MG capsule Take 1 capsule (10 mg total) by mouth 4 (four) times daily -  before meals and at bedtime. 120 capsule 0   ezetimibe (ZETIA) 10 MG tablet Take 10 mg by mouth daily.     furosemide (LASIX) 20 MG tablet TAKE 1 TABLET(20 MG) BY MOUTH DAILY AS NEEDED FOR SWELLING (Patient taking differently: Take 20 mg by mouth daily as needed for fluid.) 90 tablet 3   glimepiride (AMARYL) 2 MG tablet Take 1 tablet (2 mg total) by mouth daily before breakfast. 90 tablet 1   hydroxypropyl methylcellulose (ISOPTO TEARS) 2.5 % ophthalmic solution Place 1 drop into both eyes 2 (two) times daily.     isosorbide mononitrate (IMDUR) 30 MG 24 hr tablet Take 1 tablet (30 mg total) by mouth daily. 90 tablet 2   metoprolol  succinate (TOPROL-XL) 50 MG 24 hr tablet TAKE 1 1/2 tables in the morning and take 1 1/2 tablet in the evening.     pantoprazole (PROTONIX) 40 MG tablet TAKE 1 TABLET BY MOUTH EVERY DAY 90 tablet 1   simvastatin (ZOCOR) 20 MG tablet Take 1 tablet (20 mg total) by mouth at bedtime. 90 tablet 3   warfarin (COUMADIN) 5 MG tablet TAKE 1/2 TO 1 TABLET BY MOUTH DAILY AS DIRECTED BY COUMADIN CLINIC (Patient taking differently: Take 5 mg by mouth daily.) 90 tablet 1   No current facility-administered medications for this visit.     Review of Systems    ***.  All other systems reviewed and are otherwise negative except as noted above.    Physical Exam    VS:  BP (!) 140/74 (BP Location: Left Arm, Patient Position: Sitting, Cuff Size: Normal)   Pulse 75   Ht 5\' 6"  (1.676 m)   Wt 192 lb 6.4 oz (87.3 kg)   BMI 31.05 kg/m   GEN: Well nourished, well developed, in no acute distress. HEENT: normal. Neck: Supple, no JVD, carotid bruits, or masses. Cardiac: RRR, no murmurs, rubs, or gallops. No clubbing, cyanosis, edema.  Radials/DP/PT 2+ and equal  bilaterally.  Respiratory:  Respirations regular and unlabored, clear to auscultation bilaterally. GI: Soft, nontender, nondistended, BS + x 4. MS: no deformity or atrophy. Skin: warm and dry, no rash. Neuro:  Strength and sensation are intact. Psych: Normal affect.  Accessory Clinical Findings    ECG personally reviewed by me today - EKG Interpretation Date/Time:  Tuesday May 26 2023 10:39:02 EST Ventricular Rate:  75 PR Interval:  212 QRS Duration:  134 QT Interval:  410 QTC Calculation: 457 R Axis:   -50  Text Interpretation: Sinus rhythm with marked sinus arrhythmia with 1st degree A-V block with Premature supraventricular complexes Left axis deviation Left ventricular hypertrophy Left bundle branch block When compared with ECG of 14-May-2023 22:41, PREVIOUS ECG IS PRESENT Confirmed by Bernadene Person ([id number]) on 05/26/2023 10:42:00 AM  - no acute changes.   Lab Results  Component Value Date   WBC 6.8 05/22/2023   HGB 11.7 (L) 05/22/2023   HCT 35.7 (L) 05/22/2023   MCV 88.0 05/22/2023   PLT 305.0 05/22/2023   Lab Results  Component Value Date   CREATININE 1.12 05/22/2023   BUN 10 05/22/2023   NA 136 05/22/2023   K 3.8 05/22/2023   CL 97 05/22/2023   CO2 30 05/22/2023   Lab Results  Component Value Date   ALT 18 05/22/2023   AST 22 05/22/2023   ALKPHOS 84 05/22/2023   BILITOT 0.5 05/22/2023   Lab Results  Component Value Date   CHOL 163 11/27/2022   HDL 57.30 11/27/2022   LDLCALC 76 11/27/2022   LDLDIRECT 105.5 11/26/2010   TRIG 149.0 11/27/2022   CHOLHDL 3 11/27/2022    Lab Results  Component Value Date   HGBA1C 8.3 (H) 05/22/2023    Assessment & Plan    1.  ***  The patient's 1st BP is elevated (>139/89)*** Repeat BP and {Click to enter a 2nd BP Refresh Note  :1}   Joylene Grapes, NP 05/26/2023, 10:45 AM

## 2023-05-26 ENCOUNTER — Encounter: Payer: Self-pay | Admitting: Nurse Practitioner

## 2023-05-26 ENCOUNTER — Ambulatory Visit: Payer: Medicare HMO | Admitting: Nurse Practitioner

## 2023-05-26 ENCOUNTER — Ambulatory Visit: Payer: Medicare HMO | Attending: Nurse Practitioner | Admitting: Nurse Practitioner

## 2023-05-26 VITALS — BP 140/74 | HR 75 | Ht 66.0 in | Wt 192.4 lb

## 2023-05-26 DIAGNOSIS — I2510 Atherosclerotic heart disease of native coronary artery without angina pectoris: Secondary | ICD-10-CM | POA: Diagnosis not present

## 2023-05-26 DIAGNOSIS — I447 Left bundle-branch block, unspecified: Secondary | ICD-10-CM

## 2023-05-26 DIAGNOSIS — I491 Atrial premature depolarization: Secondary | ICD-10-CM

## 2023-05-26 DIAGNOSIS — I10 Essential (primary) hypertension: Secondary | ICD-10-CM

## 2023-05-26 DIAGNOSIS — I480 Paroxysmal atrial fibrillation: Secondary | ICD-10-CM | POA: Diagnosis not present

## 2023-05-26 DIAGNOSIS — R002 Palpitations: Secondary | ICD-10-CM

## 2023-05-26 DIAGNOSIS — I471 Supraventricular tachycardia, unspecified: Secondary | ICD-10-CM | POA: Diagnosis not present

## 2023-05-26 DIAGNOSIS — E785 Hyperlipidemia, unspecified: Secondary | ICD-10-CM

## 2023-05-26 MED ORDER — METOPROLOL TARTRATE 25 MG PO TABS: ORAL_TABLET | ORAL | 3 refills | Status: DC

## 2023-05-26 NOTE — Patient Instructions (Signed)
Medication Instructions:  Metoprolol Tartrate 25 mg twice daily as need only for palpitations. Continue Metoprolol 75 mg twice daily   *If you need a refill on your cardiac medications before your next appointment, please call your pharmacy*   Lab Work: NONE ordered at this time of appointment   Testing/Procedures: NONE ordered at this time of appointment   Follow-Up: At [facility], you and your health needs are our priority.  As part of our continuing mission to provide you with exceptional heart care, we have created designated Provider Care Teams.  These Care Teams include your primary Cardiologist (physician) and Advanced Practice Providers (APPs -  Physician Assistants and Nurse Practitioners) who all work together to provide you with the care you need, when you need it.  We recommend signing up for the patient portal called "MyChart".  Sign up information is provided on this After Visit Summary.  MyChart is used to connect with patients for Virtual Visits (Telemedicine).  Patients are able to view lab/test results, encounter notes, upcoming appointments, etc.  Non-urgent messages can be sent to your provider as well.   To learn more about what you can do with MyChart, go to [email or web address].    Your next appointment:   4-6 month(s)  Provider:   Chrystie Nose, MD

## 2023-05-27 ENCOUNTER — Encounter: Payer: Self-pay | Admitting: Nurse Practitioner

## 2023-05-27 DIAGNOSIS — E876 Hypokalemia: Secondary | ICD-10-CM | POA: Diagnosis not present

## 2023-05-27 DIAGNOSIS — I2510 Atherosclerotic heart disease of native coronary artery without angina pectoris: Secondary | ICD-10-CM | POA: Diagnosis not present

## 2023-05-27 DIAGNOSIS — F32A Depression, unspecified: Secondary | ICD-10-CM | POA: Diagnosis not present

## 2023-05-27 DIAGNOSIS — I480 Paroxysmal atrial fibrillation: Secondary | ICD-10-CM | POA: Diagnosis not present

## 2023-05-27 DIAGNOSIS — I088 Other rheumatic multiple valve diseases: Secondary | ICD-10-CM | POA: Diagnosis not present

## 2023-05-27 DIAGNOSIS — I10 Essential (primary) hypertension: Secondary | ICD-10-CM | POA: Diagnosis not present

## 2023-05-27 DIAGNOSIS — I471 Supraventricular tachycardia, unspecified: Secondary | ICD-10-CM | POA: Diagnosis not present

## 2023-05-27 DIAGNOSIS — I872 Venous insufficiency (chronic) (peripheral): Secondary | ICD-10-CM | POA: Diagnosis not present

## 2023-05-27 DIAGNOSIS — F411 Generalized anxiety disorder: Secondary | ICD-10-CM | POA: Diagnosis not present

## 2023-05-29 ENCOUNTER — Other Ambulatory Visit: Payer: Self-pay | Admitting: Internal Medicine

## 2023-06-01 ENCOUNTER — Ambulatory Visit: Payer: Self-pay

## 2023-06-01 DIAGNOSIS — F32A Depression, unspecified: Secondary | ICD-10-CM | POA: Diagnosis not present

## 2023-06-01 DIAGNOSIS — I10 Essential (primary) hypertension: Secondary | ICD-10-CM | POA: Diagnosis not present

## 2023-06-01 DIAGNOSIS — I088 Other rheumatic multiple valve diseases: Secondary | ICD-10-CM | POA: Diagnosis not present

## 2023-06-01 DIAGNOSIS — F411 Generalized anxiety disorder: Secondary | ICD-10-CM | POA: Diagnosis not present

## 2023-06-01 DIAGNOSIS — I471 Supraventricular tachycardia, unspecified: Secondary | ICD-10-CM | POA: Diagnosis not present

## 2023-06-01 DIAGNOSIS — I2510 Atherosclerotic heart disease of native coronary artery without angina pectoris: Secondary | ICD-10-CM | POA: Diagnosis not present

## 2023-06-01 DIAGNOSIS — I480 Paroxysmal atrial fibrillation: Secondary | ICD-10-CM | POA: Diagnosis not present

## 2023-06-01 DIAGNOSIS — E876 Hypokalemia: Secondary | ICD-10-CM | POA: Diagnosis not present

## 2023-06-01 DIAGNOSIS — I872 Venous insufficiency (chronic) (peripheral): Secondary | ICD-10-CM | POA: Diagnosis not present

## 2023-06-01 NOTE — Patient Outreach (Signed)
  Care Coordination   Follow Up Visit Note   06/01/2023 Name: [name] MRN: [id number] DOB: Jul 26, 1934  [name] is a 89 y.o. year old female who sees Plotnikov, Georgina Quint, MD for primary care. I spoke with  [name] by phone today.  What matters to the patients health and wellness today? Admission 05/14/23-05/17/23 due to hyponatremia. [name] reports she is doing "pretty good". She reports home health therapist is scheduled to come out later today. She reports palpitations once since discharge from hospital. Per review of chart cardiology follow up completed on 05/26/23. Patient denies any palpitations at this time. Denies any health concerns at this time.   Goals Addressed             This Visit's Progress    Assist with education/management of health condition       Interventions Today    Flowsheet Row Most Recent Value  Chronic Disease   Chronic disease during today's visit Atrial Fibrillation (AFib)  General Interventions   General Interventions Discussed/Reviewed General Interventions Reviewed, Doctor Visits  [Evaluation of current treatment plan for health condition and patient's adherence to plan.]  Doctor Visits Discussed/Reviewed PCP, Specialist  PCP/Specialist Visits Compliance with follow-up visit  [reviewed upcoming follow/scheduled appointments]  Education Interventions   Education Provided Provided Education  Provided Verbal Education On When to see the doctor, Other, Medication, Nutrition  [advised to take medications as scheduled, eat healthy,reviewed patient instructions per cardiology visit 05/26/23 and PCP post hospital visit on 05/22/23]  Nutrition Interventions   Nutrition Discussed/Reviewed Fluid intake, Nutrition Reviewed  Pharmacy Interventions   Pharmacy Dicussed/Reviewed Pharmacy Topics Reviewed  [medications reviewed, reitterated with patient the diferrence between the scheduled Metoprolol Succinate and the as needed metoprolol  tartrate. advised to monitor symptoms and blood pressure and notify provider with heatlh questions or concerns.]  Safety Interventions   Safety Discussed/Reviewed Safety Reviewed  [confirmed patient is still active with home health PT. discussed importance of maintaining good muscle strength and tone]            SDOH assessments and interventions completed:  No  Care Coordination Interventions:  Yes, provided   Follow up plan: Follow up call scheduled for 06/29/23    Encounter Outcome:  Patient Visit Completed   Kathyrn Sheriff, RN, MSN, BSN, CCM [place]  [facility], Population Health Case Manager Phone: [phone]

## 2023-06-01 NOTE — Patient Instructions (Signed)
 Visit Information  Thank you for taking time to visit with me today. Please don't hesitate to contact me if I can be of assistance to you.   Following are the goals we discussed today:  Continue to take medications as prescribed. Continue to attend provider visits as scheduled Continue to eat healthy, lean meats, vegetables, fruits, avoid saturated and transfats Contact provider with health questions or concerns as needed Check and record blood pressure routinely and contact provider if questions or concerns  Our next appointment is by telephone on 06/29/23 at 11:00 am  Please call the care guide team at [phone] if you need to cancel or reschedule your appointment.   If you are experiencing a Mental Health or Behavioral Health Crisis or need someone to talk to, please call the Suicide and Crisis Lifeline: 988 call the [place] National Suicide Prevention Lifeline: 8183495643 or TTY: (212)293-7420 TTY 7745928997) to talk to a trained counselor  Kathyrn Sheriff, RN, MSN, BSN, CCM [place]  [facility], Population Health Case Manager Phone: [phone]

## 2023-06-02 ENCOUNTER — Telehealth: Payer: Self-pay

## 2023-06-02 NOTE — Telephone Encounter (Signed)
 Copied from CRM [id number]. Topic: Clinical - Home Health Verbal Orders >> Jun 02, 2023 10:49 AM Drema Balzarine wrote:  Caller/Agency: [facility]  Callback Number: [phone] Service Requested: Physical Therapy Frequency: Any new concerns about the patient? Yes, patient is not taking the Zepia- is this okay?

## 2023-06-03 NOTE — Telephone Encounter (Signed)
 Okay physical therapy. Discontinue Zetia. Thank you

## 2023-06-03 NOTE — Telephone Encounter (Signed)
 I was able to give verbal orders for  physical therapy and to  Discontinue Zetia.

## 2023-06-03 NOTE — Addendum Note (Signed)
 Addended by: Tresa Garter on: 06/03/2023 02:33 PM   Modules accepted: Orders

## 2023-06-05 DIAGNOSIS — F411 Generalized anxiety disorder: Secondary | ICD-10-CM | POA: Diagnosis not present

## 2023-06-05 DIAGNOSIS — I471 Supraventricular tachycardia, unspecified: Secondary | ICD-10-CM | POA: Diagnosis not present

## 2023-06-05 DIAGNOSIS — I2510 Atherosclerotic heart disease of native coronary artery without angina pectoris: Secondary | ICD-10-CM | POA: Diagnosis not present

## 2023-06-05 DIAGNOSIS — I872 Venous insufficiency (chronic) (peripheral): Secondary | ICD-10-CM | POA: Diagnosis not present

## 2023-06-05 DIAGNOSIS — F32A Depression, unspecified: Secondary | ICD-10-CM | POA: Diagnosis not present

## 2023-06-05 DIAGNOSIS — I480 Paroxysmal atrial fibrillation: Secondary | ICD-10-CM | POA: Diagnosis not present

## 2023-06-05 DIAGNOSIS — E876 Hypokalemia: Secondary | ICD-10-CM | POA: Diagnosis not present

## 2023-06-05 DIAGNOSIS — I088 Other rheumatic multiple valve diseases: Secondary | ICD-10-CM | POA: Diagnosis not present

## 2023-06-05 DIAGNOSIS — I10 Essential (primary) hypertension: Secondary | ICD-10-CM | POA: Diagnosis not present

## 2023-06-08 DIAGNOSIS — I088 Other rheumatic multiple valve diseases: Secondary | ICD-10-CM | POA: Diagnosis not present

## 2023-06-08 DIAGNOSIS — I471 Supraventricular tachycardia, unspecified: Secondary | ICD-10-CM | POA: Diagnosis not present

## 2023-06-08 DIAGNOSIS — F32A Depression, unspecified: Secondary | ICD-10-CM | POA: Diagnosis not present

## 2023-06-08 DIAGNOSIS — I10 Essential (primary) hypertension: Secondary | ICD-10-CM | POA: Diagnosis not present

## 2023-06-08 DIAGNOSIS — I872 Venous insufficiency (chronic) (peripheral): Secondary | ICD-10-CM | POA: Diagnosis not present

## 2023-06-08 DIAGNOSIS — E876 Hypokalemia: Secondary | ICD-10-CM | POA: Diagnosis not present

## 2023-06-08 DIAGNOSIS — F411 Generalized anxiety disorder: Secondary | ICD-10-CM | POA: Diagnosis not present

## 2023-06-08 DIAGNOSIS — I480 Paroxysmal atrial fibrillation: Secondary | ICD-10-CM | POA: Diagnosis not present

## 2023-06-08 DIAGNOSIS — I2510 Atherosclerotic heart disease of native coronary artery without angina pectoris: Secondary | ICD-10-CM | POA: Diagnosis not present

## 2023-06-09 ENCOUNTER — Ambulatory Visit: Payer: Medicare HMO | Attending: Cardiology | Admitting: *Deleted

## 2023-06-09 DIAGNOSIS — I480 Paroxysmal atrial fibrillation: Secondary | ICD-10-CM | POA: Diagnosis not present

## 2023-06-09 DIAGNOSIS — Z7901 Long term (current) use of anticoagulants: Secondary | ICD-10-CM

## 2023-06-09 LAB — POCT INR: INR: 2 (ref 2.0–3.0)

## 2023-06-09 NOTE — Patient Instructions (Addendum)
 Description   Today take 1.5 tablets of warfarin then continue taking Warfarin 1 tablet daily. Repeat INR in 6 weeks. Call [phone] with an warfarin related questions. *Consistent amount of greens every week*

## 2023-06-11 DIAGNOSIS — E876 Hypokalemia: Secondary | ICD-10-CM | POA: Diagnosis not present

## 2023-06-11 DIAGNOSIS — I480 Paroxysmal atrial fibrillation: Secondary | ICD-10-CM | POA: Diagnosis not present

## 2023-06-11 DIAGNOSIS — I872 Venous insufficiency (chronic) (peripheral): Secondary | ICD-10-CM | POA: Diagnosis not present

## 2023-06-11 DIAGNOSIS — I10 Essential (primary) hypertension: Secondary | ICD-10-CM | POA: Diagnosis not present

## 2023-06-11 DIAGNOSIS — I471 Supraventricular tachycardia, unspecified: Secondary | ICD-10-CM | POA: Diagnosis not present

## 2023-06-11 DIAGNOSIS — I088 Other rheumatic multiple valve diseases: Secondary | ICD-10-CM | POA: Diagnosis not present

## 2023-06-11 DIAGNOSIS — I2510 Atherosclerotic heart disease of native coronary artery without angina pectoris: Secondary | ICD-10-CM | POA: Diagnosis not present

## 2023-06-11 DIAGNOSIS — F411 Generalized anxiety disorder: Secondary | ICD-10-CM | POA: Diagnosis not present

## 2023-06-11 DIAGNOSIS — F32A Depression, unspecified: Secondary | ICD-10-CM | POA: Diagnosis not present

## 2023-06-15 DIAGNOSIS — I10 Essential (primary) hypertension: Secondary | ICD-10-CM | POA: Diagnosis not present

## 2023-06-15 DIAGNOSIS — I872 Venous insufficiency (chronic) (peripheral): Secondary | ICD-10-CM | POA: Diagnosis not present

## 2023-06-15 DIAGNOSIS — F32A Depression, unspecified: Secondary | ICD-10-CM | POA: Diagnosis not present

## 2023-06-15 DIAGNOSIS — N2889 Other specified disorders of kidney and ureter: Secondary | ICD-10-CM

## 2023-06-15 DIAGNOSIS — K449 Diaphragmatic hernia without obstruction or gangrene: Secondary | ICD-10-CM

## 2023-06-15 DIAGNOSIS — I2510 Atherosclerotic heart disease of native coronary artery without angina pectoris: Secondary | ICD-10-CM | POA: Diagnosis not present

## 2023-06-15 DIAGNOSIS — I480 Paroxysmal atrial fibrillation: Secondary | ICD-10-CM | POA: Diagnosis not present

## 2023-06-15 DIAGNOSIS — M810 Age-related osteoporosis without current pathological fracture: Secondary | ICD-10-CM

## 2023-06-15 DIAGNOSIS — E876 Hypokalemia: Secondary | ICD-10-CM | POA: Diagnosis not present

## 2023-06-15 DIAGNOSIS — F411 Generalized anxiety disorder: Secondary | ICD-10-CM | POA: Diagnosis not present

## 2023-06-15 DIAGNOSIS — I471 Supraventricular tachycardia, unspecified: Secondary | ICD-10-CM | POA: Diagnosis not present

## 2023-06-15 DIAGNOSIS — I088 Other rheumatic multiple valve diseases: Secondary | ICD-10-CM | POA: Diagnosis not present

## 2023-06-17 DIAGNOSIS — I10 Essential (primary) hypertension: Secondary | ICD-10-CM | POA: Diagnosis not present

## 2023-06-17 DIAGNOSIS — F411 Generalized anxiety disorder: Secondary | ICD-10-CM | POA: Diagnosis not present

## 2023-06-17 DIAGNOSIS — F32A Depression, unspecified: Secondary | ICD-10-CM | POA: Diagnosis not present

## 2023-06-17 DIAGNOSIS — I872 Venous insufficiency (chronic) (peripheral): Secondary | ICD-10-CM | POA: Diagnosis not present

## 2023-06-17 DIAGNOSIS — I2510 Atherosclerotic heart disease of native coronary artery without angina pectoris: Secondary | ICD-10-CM | POA: Diagnosis not present

## 2023-06-17 DIAGNOSIS — I471 Supraventricular tachycardia, unspecified: Secondary | ICD-10-CM | POA: Diagnosis not present

## 2023-06-17 DIAGNOSIS — E876 Hypokalemia: Secondary | ICD-10-CM | POA: Diagnosis not present

## 2023-06-17 DIAGNOSIS — I088 Other rheumatic multiple valve diseases: Secondary | ICD-10-CM | POA: Diagnosis not present

## 2023-06-17 DIAGNOSIS — I480 Paroxysmal atrial fibrillation: Secondary | ICD-10-CM | POA: Diagnosis not present

## 2023-06-24 DIAGNOSIS — I471 Supraventricular tachycardia, unspecified: Secondary | ICD-10-CM | POA: Diagnosis not present

## 2023-06-24 DIAGNOSIS — I088 Other rheumatic multiple valve diseases: Secondary | ICD-10-CM | POA: Diagnosis not present

## 2023-06-24 DIAGNOSIS — I2510 Atherosclerotic heart disease of native coronary artery without angina pectoris: Secondary | ICD-10-CM | POA: Diagnosis not present

## 2023-06-24 DIAGNOSIS — F411 Generalized anxiety disorder: Secondary | ICD-10-CM | POA: Diagnosis not present

## 2023-06-24 DIAGNOSIS — I872 Venous insufficiency (chronic) (peripheral): Secondary | ICD-10-CM | POA: Diagnosis not present

## 2023-06-24 DIAGNOSIS — I10 Essential (primary) hypertension: Secondary | ICD-10-CM | POA: Diagnosis not present

## 2023-06-24 DIAGNOSIS — E876 Hypokalemia: Secondary | ICD-10-CM | POA: Diagnosis not present

## 2023-06-24 DIAGNOSIS — F32A Depression, unspecified: Secondary | ICD-10-CM | POA: Diagnosis not present

## 2023-06-24 DIAGNOSIS — I480 Paroxysmal atrial fibrillation: Secondary | ICD-10-CM | POA: Diagnosis not present

## 2023-06-26 DIAGNOSIS — E876 Hypokalemia: Secondary | ICD-10-CM | POA: Diagnosis not present

## 2023-06-26 DIAGNOSIS — I2510 Atherosclerotic heart disease of native coronary artery without angina pectoris: Secondary | ICD-10-CM | POA: Diagnosis not present

## 2023-06-26 DIAGNOSIS — I872 Venous insufficiency (chronic) (peripheral): Secondary | ICD-10-CM | POA: Diagnosis not present

## 2023-06-26 DIAGNOSIS — I088 Other rheumatic multiple valve diseases: Secondary | ICD-10-CM | POA: Diagnosis not present

## 2023-06-26 DIAGNOSIS — I10 Essential (primary) hypertension: Secondary | ICD-10-CM | POA: Diagnosis not present

## 2023-06-26 DIAGNOSIS — I471 Supraventricular tachycardia, unspecified: Secondary | ICD-10-CM | POA: Diagnosis not present

## 2023-06-26 DIAGNOSIS — F411 Generalized anxiety disorder: Secondary | ICD-10-CM | POA: Diagnosis not present

## 2023-06-26 DIAGNOSIS — F32A Depression, unspecified: Secondary | ICD-10-CM | POA: Diagnosis not present

## 2023-06-26 DIAGNOSIS — I480 Paroxysmal atrial fibrillation: Secondary | ICD-10-CM | POA: Diagnosis not present

## 2023-06-30 ENCOUNTER — Ambulatory Visit: Payer: Self-pay

## 2023-06-30 NOTE — Patient Outreach (Signed)
 Care Coordination   Follow Up Visit Note   06/30/2023 Name: [name] MRN: [id number] DOB: [birth year]  [name] is a 89 y.o. year old female who sees Plotnikov, Georgina Quint, MD for primary care. I spoke with  [name] by phone today.  What matters to the patients health and wellness today?  [name] reports she continues to be active with home health PT. She states she is checking BP and latest BP 132/66 and 140/76. She reports she is checking BS every day and reports BS today was 130 and 140 "the other day". Patient states, she declines to take Glimepiride, due to concern that it may lower her BS too much. She states she will discuss with primary provider.  Goals Addressed             This Visit's Progress    Assist with education/management of health condition       Interventions Today    Flowsheet Row Most Recent Value  Chronic Disease   Chronic disease during today's visit Chronic Kidney Disease/End Stage Renal Disease (ESRD), Hypertension (HTN), Diabetes  General Interventions   General Interventions Discussed/Reviewed General Interventions Reviewed, Doctor Visits, Communication with  Doctor Visits Discussed/Reviewed PCP, Specialist  PCP/Specialist Visits Compliance with follow-up visit  [scheduled/upcoming appointment reviewed with patient]  Communication with PCP/Specialists  [message to PCP to update-patient declining to take glimeperide.]  Education Interventions   Education Provided Provided Education  [discussed importance of managing blood sugar.]  Provided Verbal Education On When to see the doctor, Medication, Nutrition, Blood Sugar Monitoring, Labs  [advised patient to take medications as prescribed-encouraged patient to discuss any questions or concerns with provider, encouraged to continue to attend provider appointments as scheduled, discussed eating healthy]  Labs Reviewed Hgb A1c  [reviewed with patient A1C increase to 8.3 on 05/2023 from 7.2  on 09/16/23.]  Nutrition Interventions   Nutrition Discussed/Reviewed Nutrition Discussed  Pharmacy Interventions   Pharmacy Dicussed/Reviewed Pharmacy Topics Reviewed  [medications reviewed. encouraged to discuss concerns regarding medications with provider.]            SDOH assessments and interventions completed:  No  Care Coordination Interventions:  Yes, provided   Follow up plan: Follow up call scheduled for 07/30/23    Encounter Outcome:  Patient Visit Completed   Kathyrn Sheriff, RN, MSN, BSN, CCM [place]  [facility], Population Health Case Manager Phone: [phone]

## 2023-06-30 NOTE — Patient Instructions (Signed)
 Visit Information  Thank you for taking time to visit with me today. Please don't hesitate to contact me if I can be of assistance to you.   Following are the goals we discussed today:  Continue to take medications as prescribed-contact provider with questions or concerns regarding medications. Continue to attend provider visits as scheduled Continue to eat healthy, lean meats, vegetables, fruits, avoid saturated and transfats Contact provider with health questions or concerns as needed Continue to check blood sugar as recommended and notify provider if questions or concerns Continue to check blood pressure routinely and contact provider if questions or concerns   Our next appointment is by telephone on 07/30/23 at 1:15 pm  Please call the care guide team at [phone] if you need to cancel or reschedule your appointment.   If you are experiencing a Mental Health or Behavioral Health Crisis or need someone to talk to, please call the Suicide and Crisis Lifeline: 988 call the [place] National Suicide Prevention Lifeline: 670-069-3981 or TTY: 425-266-8033 TTY 870-521-6732) to talk to a trained counselor  Kathyrn Sheriff, RN, MSN, BSN, CCM [place]  [facility], Population Health Case Manager Phone: [phone]

## 2023-07-01 ENCOUNTER — Other Ambulatory Visit: Payer: Self-pay | Admitting: Internal Medicine

## 2023-07-01 DIAGNOSIS — I088 Other rheumatic multiple valve diseases: Secondary | ICD-10-CM | POA: Diagnosis not present

## 2023-07-01 DIAGNOSIS — F32A Depression, unspecified: Secondary | ICD-10-CM | POA: Diagnosis not present

## 2023-07-01 DIAGNOSIS — F411 Generalized anxiety disorder: Secondary | ICD-10-CM | POA: Diagnosis not present

## 2023-07-01 DIAGNOSIS — I2510 Atherosclerotic heart disease of native coronary artery without angina pectoris: Secondary | ICD-10-CM | POA: Diagnosis not present

## 2023-07-01 DIAGNOSIS — I471 Supraventricular tachycardia, unspecified: Secondary | ICD-10-CM | POA: Diagnosis not present

## 2023-07-01 DIAGNOSIS — E876 Hypokalemia: Secondary | ICD-10-CM | POA: Diagnosis not present

## 2023-07-01 DIAGNOSIS — I480 Paroxysmal atrial fibrillation: Secondary | ICD-10-CM | POA: Diagnosis not present

## 2023-07-01 DIAGNOSIS — I10 Essential (primary) hypertension: Secondary | ICD-10-CM | POA: Diagnosis not present

## 2023-07-01 DIAGNOSIS — I872 Venous insufficiency (chronic) (peripheral): Secondary | ICD-10-CM | POA: Diagnosis not present

## 2023-07-04 ENCOUNTER — Other Ambulatory Visit: Payer: Self-pay | Admitting: Internal Medicine

## 2023-07-09 DIAGNOSIS — I872 Venous insufficiency (chronic) (peripheral): Secondary | ICD-10-CM | POA: Diagnosis not present

## 2023-07-09 DIAGNOSIS — I088 Other rheumatic multiple valve diseases: Secondary | ICD-10-CM | POA: Diagnosis not present

## 2023-07-09 DIAGNOSIS — I2510 Atherosclerotic heart disease of native coronary artery without angina pectoris: Secondary | ICD-10-CM | POA: Diagnosis not present

## 2023-07-09 DIAGNOSIS — I480 Paroxysmal atrial fibrillation: Secondary | ICD-10-CM | POA: Diagnosis not present

## 2023-07-09 DIAGNOSIS — I471 Supraventricular tachycardia, unspecified: Secondary | ICD-10-CM | POA: Diagnosis not present

## 2023-07-09 DIAGNOSIS — F411 Generalized anxiety disorder: Secondary | ICD-10-CM | POA: Diagnosis not present

## 2023-07-09 DIAGNOSIS — I10 Essential (primary) hypertension: Secondary | ICD-10-CM | POA: Diagnosis not present

## 2023-07-09 DIAGNOSIS — E876 Hypokalemia: Secondary | ICD-10-CM | POA: Diagnosis not present

## 2023-07-09 DIAGNOSIS — F32A Depression, unspecified: Secondary | ICD-10-CM | POA: Diagnosis not present

## 2023-07-15 DIAGNOSIS — F411 Generalized anxiety disorder: Secondary | ICD-10-CM | POA: Diagnosis not present

## 2023-07-15 DIAGNOSIS — I872 Venous insufficiency (chronic) (peripheral): Secondary | ICD-10-CM | POA: Diagnosis not present

## 2023-07-15 DIAGNOSIS — I10 Essential (primary) hypertension: Secondary | ICD-10-CM | POA: Diagnosis not present

## 2023-07-15 DIAGNOSIS — I088 Other rheumatic multiple valve diseases: Secondary | ICD-10-CM | POA: Diagnosis not present

## 2023-07-15 DIAGNOSIS — I480 Paroxysmal atrial fibrillation: Secondary | ICD-10-CM | POA: Diagnosis not present

## 2023-07-15 DIAGNOSIS — I2510 Atherosclerotic heart disease of native coronary artery without angina pectoris: Secondary | ICD-10-CM | POA: Diagnosis not present

## 2023-07-15 DIAGNOSIS — F32A Depression, unspecified: Secondary | ICD-10-CM | POA: Diagnosis not present

## 2023-07-15 DIAGNOSIS — E876 Hypokalemia: Secondary | ICD-10-CM | POA: Diagnosis not present

## 2023-07-15 DIAGNOSIS — I471 Supraventricular tachycardia, unspecified: Secondary | ICD-10-CM | POA: Diagnosis not present

## 2023-07-21 ENCOUNTER — Ambulatory Visit: Payer: Medicare HMO | Attending: Cardiology | Admitting: *Deleted

## 2023-07-21 DIAGNOSIS — I480 Paroxysmal atrial fibrillation: Secondary | ICD-10-CM | POA: Diagnosis not present

## 2023-07-21 DIAGNOSIS — Z7901 Long term (current) use of anticoagulants: Secondary | ICD-10-CM

## 2023-07-21 LAB — POCT INR: INR: 2 (ref 2.0–3.0)

## 2023-07-21 NOTE — Patient Instructions (Signed)
 Description   Today take 1.5 tablets of warfarin then continue taking Warfarin 1 tablet daily. Repeat INR in 6 weeks.  Call [phone] with an warfarin related questions. *Consistent amount of greens every week*      New Address: [address] [place] [address]  ,o

## 2023-07-30 ENCOUNTER — Ambulatory Visit: Payer: Self-pay

## 2023-07-30 NOTE — Telephone Encounter (Signed)
  Chief Complaint: side effect  Symptoms: was having heart flutter and dizziness after taking Frequency: stopped taking it 1 month ago Pertinent Negatives: Patient denies dizziness and stated his flutter is at baseline Disposition: [] ED /[] Urgent Care (no appt availability in office) / [] Appointment(In office/virtual)/ []  [facility]/ [] Home Care/ [] Refused Recommended Disposition /[]  Mobile Bus/ [x]  Follow-up with PCP Additional Notes:  Copied from CRM #[id number]. Topic: Clinical - Medication Question >> Jul 30, 2023  1:23 PM Marlan Silva wrote: Reason for CRM: patient states the medication glimepiride (AMARYL) 2 MG tablet is making her heart flutter and she has stopped taking it. She wants know since her sugar is low some days mostly running between (140-150 mg/dL) she wants to know if she still needs to take the medication. Patient is requesting a call back from the nurse.  Called pt and she answered. While she was giving her name the call dropped. Called pt twice and got busy signal.  Reason for Disposition  [1] Pharmacy calling with prescription question AND [2] triager unable to answer question  Answer Assessment - Initial Assessment Questions 1. NAME of MEDICINE: "What medicine(s) are you calling about?"     glimepiride 2. QUESTION: "What is your question?" (e.g., double dose of medicine, side effect)     Side effect and cause dizziness  4. SYMPTOMS: "Do you have any symptoms?" If Yes, ask: "What symptoms are you having?"  "How bad are the symptoms (e.g., mild, moderate, severe)     Said heart fluttered after taking the medication No sx heart is a baseline. Pt wants to know if she needs to take  Protocols used: Medication Question Call-A-AH

## 2023-07-31 ENCOUNTER — Other Ambulatory Visit: Payer: Self-pay | Admitting: Internal Medicine

## 2023-08-13 ENCOUNTER — Ambulatory Visit

## 2023-08-13 VITALS — Ht 66.0 in | Wt 192.0 lb

## 2023-08-13 DIAGNOSIS — Z0000 Encounter for general adult medical examination without abnormal findings: Secondary | ICD-10-CM

## 2023-08-13 DIAGNOSIS — M810 Age-related osteoporosis without current pathological fracture: Secondary | ICD-10-CM

## 2023-08-13 NOTE — Patient Instructions (Addendum)
 [name] , Thank you for taking time to come for your Medicare Wellness Visit. I appreciate your ongoing commitment to your health goals. Please review the following plan we discussed and let me know if I can assist you in the future.   Referrals/Orders/Follow-Ups/Clinician Recommendations: Aim for 30 minutes of exercise or brisk walking, 6-8 glasses of water, and 5 servings of fruits and vegetables each day. Ordered a DEXA (bone) scan due to having Osteoporosis.  Educated and advised on getting the COVID vaccine in 2025.  This is a list of the screening recommended for you and due dates:  Health Maintenance  Topic Date Due   DEXA scan (bone density measurement)  Never done   Complete foot exam   10/30/2018   COVID-19 Vaccine (5 - 2024-25 season) 12/14/2022   Eye exam for diabetics  09/04/2023   Flu Shot  11/13/2023   Medicare Annual Wellness Visit  08/12/2024   DTaP/Tdap/Td vaccine (2 - Td or Tdap) 10/02/2030   Pneumonia Vaccine  Completed   HPV Vaccine  Aged Out   Meningitis B Vaccine  Aged Out   Zoster (Shingles) Vaccine  Discontinued    Advanced directives: (Provided) Advance directive discussed with you today. I have provided a copy for you to complete at home and have notarized. Once this is complete, please bring a copy in to our office so we can scan it into your chart.   Next Medicare Annual Wellness Visit scheduled for next year: Yes  Have you seen your provider in the last 6 months (3 months if uncontrolled diabetes)? No- pt has an upcoming appointment in 09/2023.

## 2023-08-13 NOTE — Progress Notes (Cosign Needed Addendum)
 Subjective:   [name] is a 89 y.o. who presents for a Medicare Wellness preventive visit.  Visit Complete: Virtual I connected with  [name] on 08/13/23 by a audio enabled telemedicine application and verified that I am speaking with the correct person using two identifiers.  Patient Location: Home  Provider Location: Office/Clinic  I discussed the limitations of evaluation and management by telemedicine. The patient expressed understanding and agreed to proceed.  Vital Signs: Because this visit was a virtual/telehealth visit, some criteria may be missing or patient reported. Any vitals not documented were not able to be obtained and vitals that have been documented are patient reported.  VideoDeclined- This patient declined Librarian, academic. Therefore the visit was completed with audio only.  Persons Participating in Visit: Patient.  AWV Questionnaire: No: Patient Medicare AWV questionnaire was not completed prior to this visit.  Cardiac Risk Factors include: advanced age (>56men, >57 women);diabetes mellitus;dyslipidemia;hypertension;obesity (BMI >30kg/m2)     Objective:    Today's Vitals   08/13/23 1504  Weight: 192 lb (87.1 kg)  Height: 5\' 6"  (1.676 m)   Body mass index is 30.99 kg/m.     08/13/2023    3:02 PM 05/14/2023    7:56 PM 03/23/2023   10:07 AM 01/14/2023    1:46 PM 10/11/2022    7:37 PM 03/03/2022   11:35 AM 02/28/2021    1:47 PM  Advanced Directives  Does Patient Have a Medical Advance Directive? No No No No No No Yes  Type of Advance Directive       Living will  Does patient want to make changes to medical advance directive?       No - Patient declined  Would patient like information on creating a medical advance directive? Yes (MAU/Ambulatory/Procedural Areas - Information given) No - Patient declined   No - Patient declined No - Patient declined     Current Medications (verified) Outpatient Encounter  Medications as of 08/13/2023  Medication Sig   acetaminophen  (TYLENOL ) 500 MG tablet Take 500 mg by mouth every 6 (six) hours as needed.   ALPRAZolam  (XANAX ) 0.5 MG tablet TAKE 1/2 TO 1 TABLET BY MOUTH THREE TIMES DAILY AS NEEDED FOR ANXIETY. DO NOT TAKE WITH TRAMADOL . THIS IS A 30 DAY SUPPLY   amLODipine  (NORVASC ) 5 MG tablet TAKE 1 TABLET(5 MG) BY MOUTH DAILY   aspirin  81 MG tablet Take 81 mg by mouth daily.   Cholecalciferol (VITAMIN D ) 50 MCG (2000 UT) CAPS TAKE 1 CAPSULE BY MOUTH EVERY DAY   diclofenac  Sodium (VOLTAREN ) 1 % GEL Apply 2 g topically 4 (four) times daily.   dicyclomine  (BENTYL ) 10 MG capsule Take 1 capsule (10 mg total) by mouth 4 (four) times daily -  before meals and at bedtime.   furosemide  (LASIX ) 20 MG tablet TAKE 1 TABLET(20 MG) BY MOUTH DAILY AS NEEDED FOR SWELLING (Patient taking differently: Take 20 mg by mouth daily as needed for fluid.)   glimepiride  (AMARYL ) 2 MG tablet Take 1 tablet (2 mg total) by mouth daily before breakfast.   hydroxypropyl methylcellulose (ISOPTO TEARS) 2.5 % ophthalmic solution Place 1 drop into both eyes 2 (two) times daily.   isosorbide  mononitrate (IMDUR ) 30 MG 24 hr tablet Take 1 tablet (30 mg total) by mouth daily.   metoprolol  succinate (TOPROL -XL) 50 MG 24 hr tablet TAKE 1 AND 1/2 TABLETS BY MOUTH TWICE DAILY   metoprolol  tartrate (LOPRESSOR ) 25 MG tablet Take 1 tablet twice daily  as needed for palpitations   pantoprazole  (PROTONIX ) 40 MG tablet TAKE 1 TABLET BY MOUTH EVERY DAY   simvastatin  (ZOCOR ) 20 MG tablet Take 1 tablet (20 mg total) by mouth at bedtime.   warfarin (COUMADIN ) 5 MG tablet TAKE 1/2 TO 1 TABLET BY MOUTH DAILY AS DIRECTED BY COUMADIN  CLINIC (Patient taking differently: Take 5 mg by mouth daily.)   No facility-administered encounter medications on file as of 08/13/2023.    Allergies (verified) Acetaminophen -codeine , Clarithromycin, Fosamax  [alendronate  sodium], Guaifenesin  er, Metformin, Prandin  [repaglinide ], and Tape    History: Past Medical History:  Diagnosis Date   Anxiety state, unspecified    Bronchitis, not specified as acute or chronic    Diaphragmatic hernia without mention of obstruction or gangrene    Dyslipidemia    Essential hypertension    Irritable bowel syndrome    Mitral valve disorders(424.0)    a. 08/2010 Echo: EF >55%, mild MR, mod TR, trace AI.   Non-obstructive CAD    a. 07/2010 Lexiscan MV: no ischemia, EF 78%; b. 07/2010 Cath: LM nl, LAD50/60p, 40 apical, LCX nl, RCA dominant, 30/40p, 67m.   Osteoarthrosis, unspecified whether generalized or localized, unspecified site    Osteoporosis, unspecified    Other abnormal glucose    PAF (paroxysmal atrial fibrillation) (HCC)    a. CHA2DS2VASc = 4-->coumadin .   Phlebitis and thrombophlebitis of superficial vessels of lower extremities    PSVT (paroxysmal supraventricular tachycardia) (HCC)    a. 05/2012 Holter: short bursts of PSVT noted-->Managed with toprol .   Unspecified venous (peripheral) insufficiency    Unspecified vitamin D  deficiency    Past Surgical History:  Procedure Laterality Date   CARDIAC CATHETERIZATION  07/30/2010   nonobstructive CAD, 20-40% RCA stenosis, 50-60% eccentric LAD stenosis more prox, 40% LAD stenosis more distal, EF 65%   TRANSTHORACIC ECHOCARDIOGRAM  09/03/2010   EF=>55%; mild MR; mod TR; AV mildly sclerotic with trace regurg; mild pulm valve regurg   VESICOVAGINAL FISTULA CLOSURE W/ TAH     Family History  Problem Relation Age of Onset   Stroke Mother    Stroke Father        also heart disease   Clotting disorder Paternal Grandmother        blood clot   Stroke Brother        also heart disease   Social History   Socioeconomic History   Marital status: Widowed    Spouse name: Not on file   Number of children: 4   Years of education: Not on file   Highest education level: Not on file  Occupational History   Occupation: retired    Associate Professor: RETIRED  Tobacco Use   Smoking status: Never     Passive exposure: Never   Smokeless tobacco: Never  Vaping Use   Vaping status: Never Used  Substance and Sexual Activity   Alcohol use: No   Drug use: No   Sexual activity: Not Currently  Other Topics Concern   Not on file  Social History Narrative   Lives with her son.   Social Drivers of Corporate investment banker Strain: Low Risk  (08/13/2023)   Overall Financial Resource Strain (CARDIA)    Difficulty of Paying Living Expenses: Not hard at all  Food Insecurity: No Food Insecurity (08/13/2023)   Hunger Vital Sign    Worried About Running Out of Food in the Last Year: Never true    Ran Out of Food in the Last Year: Never true  Transportation Needs:  No Transportation Needs (08/13/2023)   PRAPARE - Administrator, Civil Service (Medical): No    Lack of Transportation (Non-Medical): No  Physical Activity: Insufficiently Active (08/13/2023)   Exercise Vital Sign    Days of Exercise per Week: 2 days    Minutes of Exercise per Session: 10 min  Stress: No Stress Concern Present (08/13/2023)   [employer] of Occupational Health - Occupational Stress Questionnaire    Feeling of Stress : Not at all  Social Connections: Moderately Integrated (08/13/2023)   Social Connection and Isolation Panel [NHANES]    Frequency of Communication with Friends and Family: More than three times a week    Frequency of Social Gatherings with Friends and Family: More than three times a week    Attends Religious Services: More than 4 times per year    Active Member of [employer] or Organizations: Yes    Attends Banker Meetings: More than 4 times per year    Marital Status: Widowed    Tobacco Counseling Counseling given: Not Answered    Clinical Intake:  Pre-visit preparation completed: Yes  Pain : No/denies pain     BMI - recorded: 30.99 Nutritional Status: BMI > 30  Obese Nutritional Risks: None Diabetes: Yes CBG done?: Yes CBG resulted in Enter/ Edit results?: Yes  (fasting - 160) Did pt. bring in CBG monitor from home?: No  Lab Results  Component Value Date   HGBA1C 8.3 (H) 05/22/2023   HGBA1C 7.2 (H) 09/16/2022   HGBA1C 7.4 (H) 06/10/2022     How often do you need to have someone help you when you read instructions, pamphlets, or other written materials from your doctor or pharmacy?: 1 - Never  Interpreter Needed?: No  Information entered by :: Kandy Orris, CMA   Activities of Daily Living     08/13/2023    3:07 PM 05/16/2023    1:00 PM  In your present state of health, do you have any difficulty performing the following activities:  Hearing? 0 0  Vision? 0 0  Difficulty concentrating or making decisions? 0 0  Walking or climbing stairs? 0   Dressing or bathing? 0   Doing errands, shopping? 0 1  Preparing Food and eating ? N   Using the Toilet? N   In the past six months, have you accidently leaked urine? Y   Comment wears depends sometimes   Do you have problems with loss of bowel control? N   Managing your Medications? N   Managing your Finances? N   Housekeeping or managing your Housekeeping? N     Patient Care Team: Plotnikov, Oakley Bellman, MD as PCP - General (Internal Medicine) Maximo Spar Aviva Lemmings, MD as PCP - Cardiology (Cardiology) Devin Foerster, MD as Consulting Physician (Ophthalmology) Szabat, Tino Foreman, [facility] (Inactive) as Pharmacist (Pharmacist) Wallace, Juana M, RN as [facility]ins Management Wallace, Juana M, RN  Indicate any recent Medical Services you may have received from other than [facility] providers in the past year (date may be approximate).     Assessment:   This is a routine wellness examination for [name].  Hearing/Vision screen Hearing Screening - Comments:: Denies hearing difficulties   Vision Screening - Comments:: Wears rx glasses - up to date with routine eye exams with Dr Hope Ly   Goals Addressed               This Visit's Progress     Patient Stated (pt-stated)  Patient stated that  she plans to stay busy and keep moving around as much as possible.       Depression Screen     08/13/2023    3:09 PM 03/23/2023   10:15 AM 10/14/2022   10:52 AM 10/06/2022    3:26 PM 07/03/2022   10:16 AM 06/10/2022   10:08 AM 03/11/2022   11:14 AM  PHQ 2/9 Scores  PHQ - 2 Score 0 0 0 0 0 0 0  PHQ- 9 Score 0 1         Fall Risk     08/13/2023    3:08 PM 03/23/2023   10:08 AM 10/14/2022   10:52 AM 10/06/2022    3:26 PM 07/03/2022   10:15 AM  Fall Risk   Falls in the past year? 0 0 0 0 0  Number falls in past yr: 0 0 0 0 0  Injury with Fall? 0 0 0 0 0  Risk for fall due to : No Fall Risks No Fall Risks No Fall Risks Impaired balance/gait No Fall Risks  Follow up Falls prevention discussed;Falls evaluation completed Falls prevention discussed;Falls evaluation completed Falls evaluation completed Falls evaluation completed Falls evaluation completed    MEDICARE RISK AT HOME:  Medicare Risk at Home Any stairs in or around the home?: No If so, are there any without handrails?: No Home free of loose throw rugs in walkways, pet beds, electrical cords, etc?: Yes Adequate lighting in your home to reduce risk of falls?: Yes Life alert?: No Use of a cane, walker or w/c?: No Grab bars in the bathroom?: No Shower chair or bench in shower?: Yes Elevated toilet seat or a handicapped toilet?: Yes  TIMED UP AND GO:  Was the test performed?  No  Cognitive Function: 6CIT completed    10/29/2017   12:45 PM  MMSE - Mini Mental State Exam  Orientation to time 5  Orientation to Place 5  Registration 3  Attention/ Calculation 4  Recall 1  Language- name 2 objects 2  Language- repeat 1  Language- follow 3 step command 3  Language- read & follow direction 1  Write a sentence 1  Copy design 1  Total score 27        08/13/2023    3:13 PM 03/23/2023   10:09 AM 03/03/2022   11:42 AM 11/25/2018   12:04 PM  6CIT Screen  What Year? 0 points 0 points 0 points 0 points  What month? 0 points 0  points 0 points 0 points  What time? 0 points 0 points 0 points 0 points  Count back from 20 0 points 2 points 0 points 0 points  Months in reverse 4 points 4 points 0 points 4 points  Repeat phrase 0 points 0 points 0 points 4 points  Total Score 4 points 6 points 0 points 8 points    Immunizations Immunization History  Administered Date(s) Administered   Fluad Quad(high Dose 65+) 02/15/2019, 02/07/2020, 01/29/2021, 03/11/2022   Fluad Trivalent(High Dose 65+) 03/24/2023   Influenza Split 01/21/2011, 02/24/2012   Influenza Whole 02/02/2009, 05/21/2010   Influenza, High Dose Seasonal PF 03/18/2016, 03/31/2017, 02/10/2018   Influenza,inj,Quad PF,6+ Mos 01/11/2013, 05/01/2014, 02/08/2015   Moderna SARS-COV2 Booster Vaccination 08/03/2020   Moderna Sars-Covid-2 Vaccination 06/13/2019, 07/11/2019   PNEUMOCOCCAL CONJUGATE-20 12/10/2021   Pfizer Covid-19 Vaccine Bivalent Booster 84yrs & up 01/10/2021   Tdap 10/01/2020    Screening Tests Health Maintenance  Topic Date Due   DEXA SCAN  Never done   FOOT EXAM  10/30/2018   COVID-19 Vaccine (5 - 2024-25 season) 12/14/2022   OPHTHALMOLOGY EXAM  09/04/2023   INFLUENZA VACCINE  11/13/2023   Medicare Annual Wellness (AWV)  08/12/2024   DTaP/Tdap/Td (2 - Td or Tdap) 10/02/2030   Pneumonia Vaccine 21+ Years old  Completed   HPV VACCINES  Aged Out   Meningococcal B Vaccine  Aged Out   Zoster Vaccines- Shingrix  Discontinued    Health Maintenance  Health Maintenance Due  Topic Date Due   DEXA SCAN  Never done   FOOT EXAM  10/30/2018   COVID-19 Vaccine (5 - 2024-25 season) 12/14/2022   Health Maintenance Items Addressed: DEXA ordered  Additional Screening:  Vision Screening: Recommended annual ophthalmology exams for early detection of glaucoma and other disorders of the eye.  Dental Screening: Recommended annual dental exams for proper oral hygiene  Community Resource Referral / Chronic Care Management: CRR required this visit?   No   CCM required this visit?  No     Plan:     I have personally reviewed and noted the following in the patient's chart:   Medical and social history Use of alcohol, tobacco or illicit drugs  Current medications and supplements including opioid prescriptions. Patient is not currently taking opioid prescriptions. Functional ability and status Nutritional status Physical activity Advanced directives List of other physicians Hospitalizations, surgeries, and ER visits in previous 12 months Vitals Screenings to include cognitive, depression, and falls Referrals and appointments  In addition, I have reviewed and discussed with patient certain preventive protocols, quality metrics, and best practice recommendations. A written personalized care plan for preventive services as well as general preventive health recommendations were provided to patient.     Patria Bookbinder, CMA   08/13/2023   After Visit Summary: (Declined) Due to this being a telephonic visit, with patients personalized plan was offered to patient but patient Declined AVS at this time   Notes: Nothing significant to report at this time.  Medical screening examination/treatment/procedure(s) were performed by non-physician practitioner and as supervising physician I was immediately available for consultation/collaboration.  I agree with above. Adelaide Holy, MD

## 2023-08-17 ENCOUNTER — Telehealth: Payer: Self-pay

## 2023-08-17 NOTE — Telephone Encounter (Signed)
 Patient was identified as falling into the [place] Measure - Diabetes.   Patient was: Appointment already scheduled for:  06/03.

## 2023-08-24 ENCOUNTER — Telehealth: Payer: Self-pay | Admitting: Internal Medicine

## 2023-08-24 NOTE — Telephone Encounter (Signed)
 Left message for patient informing her Dr. Maximo Spar did not prescribe Bentyl , she will need to discuss with her PCP.  Provided office number for callback if any other questions.

## 2023-08-24 NOTE — Telephone Encounter (Signed)
 Pt c/o medication issue:  1. Name of Medication:   dicyclomine  (BENTYL ) 10 MG capsule   2. How are you currently taking this medication (dosage and times per day)?   Not taking  3. Are you having a reaction (difficulty breathing--STAT)?   4. What is your medication issue?   Patient stated this medication is not working for her and she has stopped taking it. Patient wants a call back to discuss next steps.

## 2023-08-31 ENCOUNTER — Telehealth: Payer: Self-pay | Admitting: Internal Medicine

## 2023-08-31 ENCOUNTER — Ambulatory Visit

## 2023-08-31 NOTE — Telephone Encounter (Signed)
 Pt c/o medication issue:  1. Name of Medication:   metoprolol  tartrate (LOPRESSOR ) 25 MG tablet    2. How are you currently taking this medication (dosage and times per day)? As written  3. Are you having a reaction (difficulty breathing--STAT)? No   4. What is your medication issue? Makes her heart flutter fast

## 2023-08-31 NOTE — Telephone Encounter (Signed)
 Spoke with pt and she stated she takes Toprol -XL 75 mg BID as ordered but is still having palpitations. Pt kept stating she would take "half a tablet" with palpitations but was never able to fully explain to me what she was taking a half tablet of. Arranged appt with Marlana Silvan, NP for 6/4 so that the pt can bring in all medication bottles and we can clearly discuss medication regimen. Pt verbalized understanding of plan. Will forward to providers and coverings.

## 2023-09-01 ENCOUNTER — Other Ambulatory Visit: Payer: Self-pay

## 2023-09-01 ENCOUNTER — Other Ambulatory Visit: Payer: Self-pay | Admitting: Internal Medicine

## 2023-09-01 ENCOUNTER — Encounter

## 2023-09-01 DIAGNOSIS — E119 Type 2 diabetes mellitus without complications: Secondary | ICD-10-CM

## 2023-09-01 DIAGNOSIS — I480 Paroxysmal atrial fibrillation: Secondary | ICD-10-CM

## 2023-09-01 NOTE — Telephone Encounter (Signed)
 Refill request for warfarin:  Last INR was 2.0 on 07/21/23 Bext INR due 08/31/23 LOV was 05/26/23  Refill approved.

## 2023-09-01 NOTE — Patient Instructions (Signed)
 Visit Information  Thank you for taking time to visit with me today. Please don't hesitate to contact me if I can be of assistance to you before our next scheduled appointment.  Our next appointment is by telephone on 10/08/23 at 11:30 am Please call the care guide team at [phone] if you need to cancel or reschedule your appointment.   Following is a copy of your care plan:   Goals Addressed             This Visit's Progress    COMPLETED: Assist with education/management of health condition       Interventions Today    Flowsheet Row Most Recent Value  Chronic Disease   Chronic disease during today's visit Chronic Kidney Disease/End Stage Renal Disease (ESRD), Hypertension (HTN), Diabetes  General Interventions   General Interventions Discussed/Reviewed General Interventions Reviewed, Doctor Visits, Communication with  Doctor Visits Discussed/Reviewed PCP, Specialist  PCP/Specialist Visits Compliance with follow-up visit  [scheduled/upcoming appointment reviewed with patient]  Communication with PCP/Specialists  [message to PCP to update-patient declining to take glimeperide.]  Education Interventions   Education Provided Provided Education  [discussed importance of managing blood sugar.]  Provided Verbal Education On When to see the doctor, Medication, Nutrition, Blood Sugar Monitoring, Labs  [advised patient to take medications as prescribed-encouraged patient to discuss any questions or concerns with provider, encouraged to continue to attend provider appointments as scheduled, discussed eating healthy]  Labs Reviewed Hgb A1c  [reviewed with patient A1C increase to 8.3 on 05/2023 from 7.2 on 09/16/23.]  Nutrition Interventions   Nutrition Discussed/Reviewed Nutrition Discussed  Pharmacy Interventions   Pharmacy Dicussed/Reviewed Pharmacy Topics Reviewed  [medications reviewed. encouraged to discuss concerns regarding medications with provider.]           VBCI RN Care Plan -  diabetes       Problems:  Chronic Disease Management support and education needs related to DMII Recent Fall  Goal: Over the next 90 days the Patient will attend all scheduled medical appointments:   as evidenced by patient report or review of chart        continue to work with RN Care Manager and/or Social Worker to address care management and care coordination needs related to DMII as evidenced by adherence to care management team scheduled appointments     demonstrate Improved adherence to prescribed treatment plan for DMII as evidenced by patient report or review of chart take all medications exactly as prescribed and will call provider for medication related questions as evidenced by patient report or review of chart    Patient will verbalize fall prevention strategies  Interventions:  Diabetes Interventions: Assessed patient's understanding of A1c goal: <8% Reviewed medications with patient and discussed importance of medication adherence Counseled on importance of regular laboratory monitoring as prescribed Discussed plans with patient for ongoing care management follow up and provided patient with direct contact information for care management team Provided patient with written educational materials related to hypo and hyperglycemia and importance of correct treatment Reviewed scheduled/upcoming provider appointments including: PCP appointment 09/15/23 and cardiology on 09/16/23 Referral made to pharmacy team for assistance with diabetes management increased A1C 8.3 (05/22/2023). Patient prescribed Glimepiride , but she refuses to take. Request recommendation for management of diabetes reports most recent fasting BS 130, 140. Lab Results  Component Value Date   HGBA1C 8.3 (H) 05/22/2023  Discussed fall prevention strategies with patient: change position slowly, keep walkways clear, maintain good lighting  Patient Self-Care Activities:  Attend all scheduled  provider appointments Call  provider office for new concerns or questions  Take medications as prescribed   Work with the pharmacist to address medication management needs and will continue to work with the clinical team to address health care and disease management related needs Maintain fall prevention strategies  Plan:  Telephone follow up appointment with care management team member scheduled for:  10/08/23 at 11:30 am           Please call the Suicide and Crisis Lifeline: 988 call the [place]  National Suicide Prevention Lifeline: 519-617-2039 or TTY: (516) 390-5831 TTY 231-144-5938) to talk to a trained counselor if you are experiencing a Mental Health or Behavioral Health Crisis or need someone to talk to.  The patient verbalized understanding of instructions, educational materials, and care plan provided today and DECLINED offer to receive copy of patient instructions, educational materials, and care plan.   Lindi Revering, RN, MSN, BSN, CCM [place]  [facility], Population Health Case Manager Phone: [phone]

## 2023-09-01 NOTE — Patient Outreach (Signed)
 Complex Care Management   Visit Note  09/01/2023  Name:  [name] MRN: [id number] DOB: 11/18/1934  Situation: Referral received for Complex Care Management related to Diabetes I obtained verbal consent from Patient.  Visit completed with patient  on the phone.  Background:   Past Medical History:  Diagnosis Date   Anxiety state, unspecified    Bronchitis, not specified as acute or chronic    Diaphragmatic hernia without mention of obstruction or gangrene    Dyslipidemia    Essential hypertension    Irritable bowel syndrome    Mitral valve disorders(424.0)    a. 08/2010 Echo: EF >55%, mild MR, mod TR, trace AI.   Non-obstructive CAD    a. 07/2010 Lexiscan MV: no ischemia, EF 78%; b. 07/2010 Cath: LM nl, LAD50/60p, 40 apical, LCX nl, RCA dominant, 30/40p, 29m.   Osteoarthrosis, unspecified whether generalized or localized, unspecified site    Osteoporosis, unspecified    Other abnormal glucose    PAF (paroxysmal atrial fibrillation) (HCC)    a. CHA2DS2VASc = 4-->coumadin .   Phlebitis and thrombophlebitis of superficial vessels of lower extremities    PSVT (paroxysmal supraventricular tachycardia) (HCC)    a. 05/2012 Holter: short bursts of PSVT noted-->Managed with toprol .   Unspecified venous (peripheral) insufficiency    Unspecified vitamin D  deficiency     Assessment: Patient Reported Symptoms:  Cognitive Cognitive Status: Insightful and able to interpret abstract concepts, Alert and oriented to person, place, and time, Normal speech and language skills      Neurological Neurological Review of Symptoms: No symptoms reported    HEENT HEENT Symptoms Reported: No symptoms reported, Eye discharge (paitent reports eyes sometimes are "watery"-patient reports is using eye drops provided/instructed by eye doctor.)      Cardiovascular Cardiovascular Symptoms Reported: No symptoms reported    Respiratory Respiratory Symptoms Reported: No symptoms reported    Endocrine  Patient reports the following symptoms related to hypoglycemia or hyperglycemia : No symptoms reported Is patient diabetic?: No Endocrine Conditions: Diabetes  Gastrointestinal Gastrointestinal Symptoms Reported: No symptoms reported      Genitourinary Genitourinary Symptoms Reported: No symptoms reported    Integumentary Integumentary Symptoms Reported: No symptoms reported    Musculoskeletal Musculoskelatal Symptoms Reviewed: Muscle pain Musculoskeletal Conditions: Back pain Musculoskeletal Management Strategies: Medication therapy, Routine screening, Exercise (reports performs excercises that was previous provided by Home health physical therapy) Musculoskeletal Self-Management Outcome: 4 (good) Falls in the past year?: Yes Number of falls in past year: 1 or less Was there an injury with Fall?: Yes (elbow bruise) Fall Risk Category Calculator: 2 Patient Fall Risk Level: Moderate Fall Risk Patient at Risk for Falls Due to: History of fall(s) Fall risk Follow up: Falls prevention discussed, Falls evaluation completed  Psychosocial Psychosocial Symptoms Reported: No symptoms reported     Quality of Family Relationships: supportive Do you feel physically threatened by others?: No      08/13/2023    3:09 PM  Depression screen PHQ 2/9  Decreased Interest 0  Down, Depressed, Hopeless 0  PHQ - 2 Score 0  Altered sleeping 0  Tired, decreased energy 0  Change in appetite 0  Feeling bad or failure about yourself  0  Trouble concentrating 0  Moving slowly or fidgety/restless 0  Suicidal thoughts 0  PHQ-9 Score 0  Difficult doing work/chores Not difficult at all    There were no vitals filed for this visit.  Medications Reviewed Today     Reviewed by Valborg Friar M, RN (  Registered Nurse) on 09/01/23 at 1435  Med List Status: <None>   Medication Order Taking? Sig Documenting Provider Last Dose Status Informant  acetaminophen  (TYLENOL ) 500 MG tablet [id number] Yes Take 500 mg  by mouth every 6 (six) hours as needed. [provider] Taking Active Self, Pharmacy Records, Family Member  ALPRAZolam  (XANAX ) 0.5 MG tablet [id number] Yes TAKE 1/2 TO 1 TABLET BY MOUTH THREE TIMES DAILY AS NEEDED FOR ANXIETY. DO NOT TAKE WITH TRAMADOL . THIS IS A 30 DAY SUPPLY Plotnikov, Oakley Bellman, MD Taking Active   amLODipine  (NORVASC ) 5 MG tablet [id number] Yes TAKE 1 TABLET(5 MG) BY MOUTH DAILY Plotnikov, Aleksei V, MD Taking Active   aspirin  81 MG tablet [id number] Yes Take 81 mg by mouth daily. [provider] Taking Active Self, Pharmacy Records, Family Member  Cholecalciferol (VITAMIN D ) 50 MCG (2000 UT) CAPS [id number] Yes TAKE 1 CAPSULE BY MOUTH EVERY DAY Plotnikov, Aleksei V, MD Taking Active Self, Pharmacy Records, Family Member  diclofenac  Sodium (VOLTAREN ) 1 % GEL [id number] Yes Apply 2 g topically 4 (four) times daily. Sandie Cross, PA-C Taking Active Self, Pharmacy Records, Family Member  dicyclomine  (BENTYL ) 10 MG capsule [id number] Yes Take 1 capsule (10 mg total) by mouth 4 (four) times daily -  before meals and at bedtime. Wellington Half, FNP Taking Active   furosemide  (LASIX ) 20 MG tablet [id number] Yes TAKE 1 TABLET(20 MG) BY MOUTH DAILY AS NEEDED FOR SWELLING  Patient taking differently: Take 20 mg by mouth daily as needed for fluid.   Hazle Lites, MD Taking Active Self, Pharmacy Records, Family Member  glimepiride  (AMARYL ) 2 MG tablet [id number] No Take 1 tablet (2 mg total) by mouth daily before breakfast.  Patient not taking: Reported on 09/01/2023   Wellington Half, FNP Not Taking Active   hydroxypropyl methylcellulose (ISOPTO TEARS) 2.5 % ophthalmic solution [id number] Yes Place 1 drop into both eyes 2 (two) times daily. [provider] Taking Active Self, Pharmacy Records, Family Member  isosorbide  mononitrate (IMDUR ) 30 MG 24 hr tablet [id number] Yes Take 1 tablet (30 mg total) by mouth daily. Plotnikov, Aleksei V, MD Taking Active  Self, Pharmacy Records, Family Member  metoprolol  succinate (TOPROL -XL) 50 MG 24 hr tablet [id number] Yes TAKE 1 AND 1/2 TABLETS BY MOUTH TWICE DAILY Hilty, Aviva Lemmings, MD Taking Active   metoprolol  tartrate (LOPRESSOR ) 25 MG tablet [id number] No Take 1 tablet twice daily as needed for palpitations  Patient not taking: Reported on 09/01/2023   Jude Norton, NP Not Taking Active   pantoprazole  (PROTONIX ) 40 MG tablet [id number] Yes TAKE 1 TABLET BY MOUTH EVERY DAY Monge, Nelva Bang, NP Taking Active   simvastatin  (ZOCOR ) 20 MG tablet [id number] Yes Take 1 tablet (20 mg total) by mouth at bedtime. Jude Norton, NP Taking Active Self, Pharmacy Records, Family Member  warfarin (COUMADIN ) 5 MG tablet [id number] Yes TAKE 1/2 TO 1 TABLET BY MOUTH DAILY AS DIRECTED BY COUMADIN  CLINIC Hilty, Aviva Lemmings, MD Taking Active   Med List Note Adams Holmes 05/15/23 2033): Tikesha Mort DIL [phone]          Recommendation:   Referral to: clinical pharmacist regarding diabetes management increased A1C 8.3 on 05/2023. Patient was prescribed Glimepiride , but she refuses to take. Request recommendation for management of diabetes reports most recent fasting BS 130, 140.  Follow Up Plan:   Telephone follow up appointment date/time:  10/08/23 at 11:30 am  Lindi Revering, RN, MSN, BSN, CCM [place]  [facility], Population Health Case Manager Phone: [phone]

## 2023-09-11 ENCOUNTER — Telehealth: Payer: Self-pay | Admitting: *Deleted

## 2023-09-11 ENCOUNTER — Ambulatory Visit: Attending: Internal Medicine

## 2023-09-11 DIAGNOSIS — Z7901 Long term (current) use of anticoagulants: Secondary | ICD-10-CM

## 2023-09-11 DIAGNOSIS — I480 Paroxysmal atrial fibrillation: Secondary | ICD-10-CM | POA: Diagnosis not present

## 2023-09-11 LAB — POCT INR: INR: 2.2 (ref 2.0–3.0)

## 2023-09-11 NOTE — Patient Instructions (Signed)
 Description   Continue taking Warfarin 1 tablet daily. Repeat INR in 6 weeks.  Call [phone] with an warfarin related questions. *Consistent amount of greens every week*

## 2023-09-11 NOTE — Progress Notes (Signed)
 Care Guide Pharmacy Note  09/11/2023 Name: [name] MRN: [id number] DOB: 1934/07/13  Referred By: Genia Kettering, MD Reason for referral: Complex Care Management and Call Attempt #1 (Outreach to schedule referral with pharmacist )   [name] is a 89 y.o. year old female who is a primary care patient of Plotnikov, Aleksei V, MD.  [name] was referred to the pharmacist for assistance related to: DMII  An unsuccessful telephone outreach was attempted today to contact the patient who was referred to the pharmacy team for assistance with medication management. Additional attempts will be made to contact the patient.  Kandis Ormond, CMA [place]  [facility], [facility] Guide Direct Dial: [phone]  Fax: [phone] Website: [email or web address]

## 2023-09-14 NOTE — Progress Notes (Signed)
 Care Guide Pharmacy Note  09/14/2023 Name: [name] MRN: [id number] DOB: Jan 18, 1935  Referred By: Genia Kettering, MD Reason for referral: Complex Care Management and Call Attempt #1 (Outreach to schedule referral with pharmacist )   [name] is a 89 y.o. year old female who is a primary care patient of Plotnikov, Aleksei V, MD.  [name] was referred to the pharmacist for assistance related to: DMII  A second unsuccessful telephone outreach was attempted today to contact the patient who was referred to the pharmacy team for assistance with medication management. Additional attempts will be made to contact the patient.  Kandis Ormond, CMA [place]  [facility], [facility] Guide Direct Dial: [phone]  Fax: [phone] Website: [email or web address]

## 2023-09-15 ENCOUNTER — Ambulatory Visit (INDEPENDENT_AMBULATORY_CARE_PROVIDER_SITE_OTHER): Payer: Medicare HMO | Admitting: Internal Medicine

## 2023-09-15 ENCOUNTER — Encounter: Payer: Self-pay | Admitting: Internal Medicine

## 2023-09-15 ENCOUNTER — Ambulatory Visit (INDEPENDENT_AMBULATORY_CARE_PROVIDER_SITE_OTHER)

## 2023-09-15 VITALS — BP 112/70 | HR 64 | Temp 98.0°F | Ht 66.0 in | Wt 192.0 lb

## 2023-09-15 DIAGNOSIS — M545 Low back pain, unspecified: Secondary | ICD-10-CM

## 2023-09-15 DIAGNOSIS — E119 Type 2 diabetes mellitus without complications: Secondary | ICD-10-CM

## 2023-09-15 DIAGNOSIS — M4316 Spondylolisthesis, lumbar region: Secondary | ICD-10-CM | POA: Diagnosis not present

## 2023-09-15 DIAGNOSIS — M5136 Other intervertebral disc degeneration, lumbar region with discogenic back pain only: Secondary | ICD-10-CM | POA: Diagnosis not present

## 2023-09-15 DIAGNOSIS — E559 Vitamin D deficiency, unspecified: Secondary | ICD-10-CM | POA: Diagnosis not present

## 2023-09-15 DIAGNOSIS — I10 Essential (primary) hypertension: Secondary | ICD-10-CM

## 2023-09-15 LAB — COMPREHENSIVE METABOLIC PANEL WITH GFR
ALT: 12 U/L (ref 0–35)
AST: 18 U/L (ref 0–37)
Albumin: 4.3 g/dL (ref 3.5–5.2)
Alkaline Phosphatase: 62 U/L (ref 39–117)
BUN: 12 mg/dL (ref 6–23)
CO2: 32 meq/L (ref 19–32)
Calcium: 9.3 mg/dL (ref 8.4–10.5)
Chloride: 100 meq/L (ref 96–112)
Creatinine, Ser: 0.97 mg/dL (ref 0.40–1.20)
GFR: 52.75 mL/min — ABNORMAL LOW (ref >60.00)
Glucose, Bld: 173 mg/dL — ABNORMAL HIGH (ref 70–99)
Potassium: 3.7 meq/L (ref 3.5–5.1)
Sodium: 136 meq/L (ref 135–145)
Total Bilirubin: 0.4 mg/dL (ref 0.2–1.2)
Total Protein: 7.3 g/dL (ref 6.0–8.3)

## 2023-09-15 LAB — CBC WITH DIFFERENTIAL/PLATELET
Basophils Absolute: 0 10*3/uL (ref 0.0–0.1)
Basophils Relative: 0.5 % (ref 0.0–3.0)
Eosinophils Absolute: 0.1 10*3/uL (ref 0.0–0.7)
Eosinophils Relative: 1.6 % (ref 0.0–5.0)
HCT: 38.4 % (ref 36.0–46.0)
Hemoglobin: 12.5 g/dL (ref 12.0–15.0)
Lymphocytes Relative: 25.6 % (ref 12.0–46.0)
Lymphs Abs: 1.3 10*3/uL (ref 0.7–4.0)
MCHC: 32.5 g/dL (ref 30.0–36.0)
MCV: 87.1 fl (ref 78.0–100.0)
Monocytes Absolute: 0.4 10*3/uL (ref 0.1–1.0)
Monocytes Relative: 9 % (ref 3.0–12.0)
Neutro Abs: 3.1 10*3/uL (ref 1.4–7.7)
Neutrophils Relative %: 63.3 % (ref 43.0–77.0)
Platelets: 209 10*3/uL (ref 150.0–400.0)
RBC: 4.41 Mil/uL (ref 3.87–5.11)
RDW: 14.3 % (ref 11.5–15.5)
WBC: 4.9 10*3/uL (ref 4.0–10.5)

## 2023-09-15 LAB — HEMOGLOBIN A1C: Hgb A1c MFr Bld: 7.9 % — ABNORMAL HIGH (ref 4.6–6.5)

## 2023-09-15 MED ORDER — FUROSEMIDE 20 MG PO TABS: ORAL_TABLET | ORAL | 3 refills | Status: AC

## 2023-09-15 NOTE — Assessment & Plan Note (Signed)
 On Vit D

## 2023-09-15 NOTE — Assessment & Plan Note (Signed)
Better  CBG 114-154 at home

## 2023-09-15 NOTE — Progress Notes (Signed)
 Care Guide Pharmacy Note  09/15/2023 Name: [name] MRN: [id number] DOB: April 14, 1935  Referred By: Genia Kettering, MD Reason for referral: Complex Care Management and Call Attempt #1 (Outreach to schedule referral with pharmacist )   [name] is a 89 y.o. year old female who is a primary care patient of Plotnikov, Aleksei V, MD.  [name] was referred to the pharmacist for assistance related to: DMII  A third unsuccessful telephone outreach was attempted today to contact the patient who was referred to the pharmacy team for assistance with medication management. The Population Health team is pleased to engage with this patient at any time in the future upon receipt of referral and should he/she be interested in assistance from the Population Health team.  Kandis Ormond, CMA [facility]  [facility], [facility] Guide Direct Dial: [phone]  Fax: [phone] Website: [email or web address]

## 2023-09-15 NOTE — Assessment & Plan Note (Addendum)
 S/p a fall. Better X ray LS spine

## 2023-09-15 NOTE — Progress Notes (Deleted)
 Office Visit    Patient Name: [name] Date of Encounter: 09/15/2023  Primary Care Provider:  Genia Kettering, MD Primary Cardiologist:  Hazle Lites, MD  Chief Complaint    89 year old female with a history of nonobstructive CAD, LBBB, paroxysmal atrial fibrillation, PSVT, hypertension, hyperlipidemia, type 2 diabetes, and chronic venous insufficiency who presents follow-up related to palpitations.   Past Medical History    Past Medical History:  Diagnosis Date   Anxiety state, unspecified    Bronchitis, not specified as acute or chronic    Diaphragmatic hernia without mention of obstruction or gangrene    Dyslipidemia    Essential hypertension    Irritable bowel syndrome    Mitral valve disorders(424.0)    a. 08/2010 Echo: EF >55%, mild MR, mod TR, trace AI.   Non-obstructive CAD    a. 07/2010 Lexiscan MV: no ischemia, EF 78%; b. 07/2010 Cath: LM nl, LAD50/60p, 40 apical, LCX nl, RCA dominant, 30/40p, 34m.   Osteoarthrosis, unspecified whether generalized or localized, unspecified site    Osteoporosis, unspecified    Other abnormal glucose    PAF (paroxysmal atrial fibrillation) (HCC)    a. CHA2DS2VASc = 4-->coumadin .   Phlebitis and thrombophlebitis of superficial vessels of lower extremities    PSVT (paroxysmal supraventricular tachycardia) (HCC)    a. 05/2012 Holter: short bursts of PSVT noted-->Managed with toprol .   Unspecified venous (peripheral) insufficiency    Unspecified vitamin D  deficiency    Past Surgical History:  Procedure Laterality Date   CARDIAC CATHETERIZATION  07/30/2010   nonobstructive CAD, 20-40% RCA stenosis, 50-60% eccentric LAD stenosis more prox, 40% LAD stenosis more distal, EF 65%   TRANSTHORACIC ECHOCARDIOGRAM  09/03/2010   EF=>55%; mild MR; mod TR; AV mildly sclerotic with trace regurg; mild pulm valve regurg   VESICOVAGINAL FISTULA CLOSURE W/ TAH      Allergies  Allergies  Allergen Reactions   Acetaminophen -Codeine       Made me depressed   Clarithromycin     REACTION: unspecified   Fosamax  [Alendronate  Sodium]     achy   Guaifenesin  Er     itching   Metformin     REACTION: pt states INTOL to Metformin "felt drained"   Prandin  [Repaglinide ]     Prandin  dropped CBGs to 66 once - she stopped it.   Tape     Adhesive tape---rash     Labs/Other Studies Reviewed    The following studies were reviewed today:  Cardiac Studies & Procedures   ______________________________________________________________________________________________   STRESS TESTS  NM MYOCAR MULTI W/SPECT W 07/22/2010  Narrative *RADIOLOGY REPORT*  Clinical Data:  Chest pain  MYOCARDIAL IMAGING WITH SPECT (REST AND PHARMACOLOGIC-STRESS) GATED LEFT VENTRICULAR WALL MOTION STUDY LEFT VENTRICULAR EJECTION FRACTION  Technique:  Standard myocardial SPECT imaging was performed after resting intravenous injection of 10 mCi Tc-37m tetrofosmin . Subsequently, intravenous infusion of Lexiscan was performed under the supervision of the Cardiology staff.  At peak effect of the drug, 30 mCi Tc-59m tetrofosmin  was injected intravenously and standard myocardial SPECT  imaging was performed.  Quantitative gated imaging was also performed to evaluate left ventricular wall motion, and estimate left ventricular ejection fraction.  Comparison:  None.  Findings:  Spect:  No perfusion defects.  Wall motion:  Normal.  Ejection fraction:  78%.  IMPRESSION: No perfusion defects.  Original Report Authenticated By: Ardyth Beech, M.D.   ECHOCARDIOGRAM  ECHOCARDIOGRAM COMPLETE 03/13/2022  Narrative ECHOCARDIOGRAM REPORT    Patient Name:   Kelly ODOR  Wiggins Date of Exam: 03/13/2022 Medical Rec #:  [id number]        Height:       66.0 in Accession #:    [id number]       Weight:       189.0 lb Date of Birth:  Nov 25, 1934        BSA:          1.952 m Patient Age:    87 years         BP:           128/78 mmHg Patient Gender: F                 HR:           61 bpm. Exam Location:  [address]  Procedure: 2D Echo, Cardiac Doppler, Color Doppler and 3D Echo  Indications:    Left Bundle Branch Block I44.7  History:        Patient has prior history of Echocardiogram examinations, most recent 05/19/2016. Arrythmias:Atrial Fibrillation; Risk Factors:Hypertension.  Sonographer:    Joleen Navy RDCS Referring Phys: [id number] HAO MENG   Sonographer Comments: Global longitudinal strain was attempted. IMPRESSIONS   1. Left ventricular ejection fraction, by estimation, is 55 to 60%. Left ventricular ejection fraction by 3D volume is 57 %. The left ventricle has normal function. The left ventricle has no regional wall motion abnormalities. Left ventricular diastolic parameters are consistent with Grade I diastolic dysfunction (impaired relaxation). 2. Right ventricular systolic function is normal. The right ventricular size is normal. There is normal pulmonary artery systolic pressure. The estimated right ventricular systolic pressure is 20.3 mmHg. 3. The mitral valve is normal in structure. Trivial mitral valve regurgitation. No evidence of mitral stenosis. 4. The aortic valve is tricuspid. Aortic valve regurgitation is not visualized. Aortic valve sclerosis/calcification is present, without any evidence of aortic stenosis. 5. The inferior vena cava is normal in size with greater than 50% respiratory variability, suggesting right atrial pressure of 3 mmHg.  FINDINGS Left Ventricle: Left ventricular ejection fraction, by estimation, is 55 to 60%. Left ventricular ejection fraction by 3D volume is 57 %. The left ventricle has normal function. The left ventricle has no regional wall motion abnormalities. The left ventricular internal cavity size was normal in size. There is no left ventricular hypertrophy. Left ventricular diastolic parameters are consistent with Grade I diastolic dysfunction (impaired relaxation). Normal left  ventricular filling pressure.  Right Ventricle: The right ventricular size is normal. No increase in right ventricular wall thickness. Right ventricular systolic function is normal. There is normal pulmonary artery systolic pressure. The tricuspid regurgitant velocity is 2.08 m/s, and with an assumed right atrial pressure of 3 mmHg, the estimated right ventricular systolic pressure is 20.3 mmHg.  Left Atrium: Left atrial size was normal in size.  Right Atrium: Right atrial size was normal in size.  Pericardium: There is no evidence of pericardial effusion.  Mitral Valve: The mitral valve is normal in structure. Trivial mitral valve regurgitation. No evidence of mitral valve stenosis.  Tricuspid Valve: The tricuspid valve is normal in structure. Tricuspid valve regurgitation is not demonstrated. No evidence of tricuspid stenosis.  Aortic Valve: The aortic valve is tricuspid. Aortic valve regurgitation is not visualized. Aortic valve sclerosis/calcification is present, without any evidence of aortic stenosis.  Pulmonic Valve: The pulmonic valve was normal in structure. Pulmonic valve regurgitation is trivial. No evidence of pulmonic stenosis.  Aorta: The aortic root is normal in size and structure.  Venous: The inferior vena cava is normal in size with greater than 50% respiratory variability, suggesting right atrial pressure of 3 mmHg.  IAS/Shunts: No atrial level shunt detected by color flow Doppler.   LEFT VENTRICLE PLAX 2D LVIDd:         4.40 cm         Diastology LVIDs:         2.90 cm         LV e' medial:    4.73 cm/s LV PW:         1.00 cm         LV E/e' medial:  10.5 LV IVS:        1.00 cm         LV e' lateral:   6.22 cm/s LVOT diam:     2.00 cm         LV E/e' lateral: 8.0 LV SV:         68 LV SV Index:   35 LVOT Area:     3.14 cm        3D Volume EF LV 3D EF:    Left ventricul ar ejection fraction by 3D volume is 57 %.  3D Volume EF: 3D EF:        57 % LV  EDV:       87 ml LV ESV:       37 ml LV SV:        49 ml  RIGHT VENTRICLE RV Basal diam:  3.60 cm RV Mid diam:    3.10 cm RV S prime:     9.55 cm/s TAPSE (M-mode): 2.3 cm  LEFT ATRIUM             Index        RIGHT ATRIUM           Index LA diam:        3.30 cm 1.69 cm/m   RA Area:     14.20 cm LA Vol (A2C):   57.8 ml 29.61 ml/m  RA Volume:   34.60 ml  17.72 ml/m LA Vol (A4C):   46.8 ml 23.95 ml/m LA Biplane Vol: 57.2 ml 29.30 ml/m AORTIC VALVE LVOT Vmax:   90.00 cm/s LVOT Vmean:  59.600 cm/s LVOT VTI:    0.217 m  AORTA Ao Root diam: 2.80 cm Ao Asc diam:  3.40 cm  MITRAL VALVE               TRICUSPID VALVE MV Area (PHT): 2.66 cm    TR Peak grad:   17.3 mmHg MV Decel Time: 285 msec    TR Vmax:        208.00 cm/s MV E velocity: 49.80 cm/s MV A velocity: 77.70 cm/s  SHUNTS MV E/A ratio:  0.64        Systemic VTI:  0.22 m Systemic Diam: 2.00 cm  Gaylyn Keas MD Electronically signed by Gaylyn Keas MD Signature Date/Time: 03/13/2022/11:20:36 AM    Final    MONITORS  LONG TERM MONITOR (3-14 DAYS) 02/23/2023  Narrative Patch Wear Time:  13 days and 23 hours (2024-10-19T14:47:18-0400 to 2024-11-02T14:47:14-0400)  Patient had a min HR of 32 bpm, max HR of 176 bpm, and avg HR of 60 bpm. Predominant underlying rhythm was Sinus Rhythm. First Degree AV Block was present. Bundle Branch Block/IVCD was present. 186 Supraventricular Tachycardia runs occurred, the run with the fastest interval lasting 4 beats with a max rate of 176 bpm,  the longest lasting 11.6 secs with an avg rate of 112 bpm. Isolated SVEs were frequent (16.0%, 180370), SVE Couplets were rare (<1.0%, 5518), and SVE Triplets were rare (<1.0%, 1800). Isolated VEs were rare (<1.0%), and no VE Couplets or VE Triplets were present. MD notification criteria for Symptomatic Bradycardia met - report posted prior to notification per account request (AP).  Sinus rhythm with 186 runs of SVT, lasting up to 11 seconds.  Frequent PAC burden of 16%.  Hazle Lites, MD, [facility], FACP [place]  [facility] Medical Director of the [facility] Diplomate of the American Board of Clinical Lipidology Attending Cardiologist Direct Dial: [phone]  Fax: [phone] Website:  [email or web address]       ______________________________________________________________________________________________     Recent Labs: 10/22/2022: TSH 1.660 05/17/2023: Magnesium 2.1 09/15/2023: ALT 12; BUN 12; Creatinine, Ser 0.97; Hemoglobin 12.5; Platelets 209.0; Potassium 3.7; Sodium 136  Recent Lipid Panel    Component Value Date/Time   CHOL 163 11/27/2022 1544   CHOL 161 09/26/2022 0931   TRIG 149.0 11/27/2022 1544   HDL 57.30 11/27/2022 1544   HDL 68 09/26/2022 0931   CHOLHDL 3 11/27/2022 1544   VLDL 29.8 11/27/2022 1544   LDLCALC 76 11/27/2022 1544   LDLCALC 75 09/26/2022 0931   LDLDIRECT 105.5 11/26/2010 0905    History of Present Illness    89 year old female with the above past medical history including nonobstructive CAD, LBBB, paroxysmal atrial fibrillation, PSVT, hypertension, hyperlipidemia, type 2 diabetes, and chronic venous insufficiency.   Myoview  in the setting of chest pain in April 2012 showed EF 78%, no evidence of ischemia.  Due to persistent symptoms she eventually underwent cardiac catheterization in April 2012 that showed normal left main, 50 to 60% LAD stenosis, 40% apical lesion, normal left circumflex, 30 to 40% proximal RCA stenosis.  Echocardiogram at that time showed EF greater than 55%, mild aortic insufficiency, aneurysmal atrial septum. Cardiac monitor in 2020 showed paroxysmal atrial fibrillation, low burden.  Repeat cardiac monitor in 06/2020 in the setting of palpitations despite titration of beta-blocker therapy revealed PSVT.  Additionally, she has a history of LBBB.  Echocardiogram in 02/2022 showed EF 55 to 60%, normal LV function,  no RWMA, normal RV, no significant valvular abnormalities.  She was evaluated in the ED on 10/11/2022 and 10/19/2022 in the setting of palpitations. She was evaluated in the ED  in 01/2023 in the setting of palpitations.  EKG showed sinus rhythm.  Labs were unremarkable, troponin was negative x 2.  Repeat cardiac monitor revealed predominantly sinus rhythm, first-degree AV block/BBB/IVCD, 186 runs of SVT, longest lasting 11.6 seconds, frequent PACs (16%) and rare PVCs. She was hospitalized from 05/14/2023 to 05/17/2023 in the setting of hyponatremia, she improved with IV fluids. She was last seen in the office on 05/26/2023 and was stable from a cardiac standpoint.  She reported ongoing intermittent palpitations, improved with an additional half tablet of metoprolol , stable nonpitting bilateral lower extremity edema, well-controlled with as needed Lasix .   She presents today for follow-up. Since her last visit she has  1. Palpitations/PSVT/PACs/Paroxysmal atrial fibrillation: Cardiac monitor in 2020 showed paroxysmal atrial fibrillation, low burden. Repeat cardiac monitor in 06/2020 in the setting of palpitations despite titration of beta-blocker therapy revealed PSVT.  Echo in 02/2022 was stable.  Repeat cardiac monitor in 02/2023 revealed predominantly sinus rhythm, first-degree AV block/BBB/IVCD, 186 runs of SVT, longest lasting 11.6 seconds, frequent PACs (16%) and rare PVCs. Beta-blocker dosing was previously,  increased, however, she did not notice much improvement in her palpitations and since resumed her previous dose.  as tolerated.  Will continue metoprolol  succinate at current dose.  Will provide a prescription for metoprolol  tartrate 25 mg twice daily as needed for palpitations.  Continue to monitor symptoms. Overall stable, though she is high risk for recurrent atrial fibrillation given high incidence of atrial ectopy.  Continue warfarin.   2. Nonobstructive CAD/LBBB: Cardiac catheterization in April 2012  showed normal left main, 50 to 60% LAD stenosis, 40% apical lesion, normal left circumflex, 30 to 40% proximal RCA stenosis. Echocardiogram in 02/2022 showed EF 55 to 60%, normal LV function, no RWMA, normal RV, no significant valvular abnormalities.  She notes some mild fatigue, otherwise stable with no anginal symptoms. No indication for ischemic evaluation. Continue aspirin , amlodipine , metoprolol , Imdur , simvastatin , Zetia .   3. Hypertension: BP mildly elevated in office today, generally well controlled. Continue current antihypertensive regimen.    4. Hyperlipidemia: LDL was 76 in 11/2022.  She apparently did not tolerate a higher dose of simvastatin . Continue simvastatin , Zetia .     5. Type 2 diabetes: A1c was 8.3 in 05/2023. Monitored and managed per PCP.    6. Disposition:  Follow-up in  Home Medications    Current Outpatient Medications  Medication Sig Dispense Refill   ACCU-CHEK GUIDE TEST test strip 1 each by Other route 2 (two) times daily.     acetaminophen  (TYLENOL ) 500 MG tablet Take 500 mg by mouth every 6 (six) hours as needed.     ALPRAZolam  (XANAX ) 0.5 MG tablet TAKE 1/2 TO 1 TABLET BY MOUTH THREE TIMES DAILY AS NEEDED FOR ANXIETY. DO NOT TAKE WITH TRAMADOL . THIS IS A 30 DAY SUPPLY 90 tablet 1   amLODipine  (NORVASC ) 5 MG tablet TAKE 1 TABLET(5 MG) BY MOUTH DAILY 90 tablet 3   aspirin  81 MG tablet Take 81 mg by mouth daily.     Cholecalciferol (VITAMIN D ) 50 MCG (2000 UT) CAPS TAKE 1 CAPSULE BY MOUTH EVERY DAY 100 capsule 3   diclofenac  Sodium (VOLTAREN ) 1 % GEL Apply 2 g topically 4 (four) times daily. 150 g 1   dicyclomine  (BENTYL ) 10 MG capsule Take 1 capsule (10 mg total) by mouth 4 (four) times daily -  before meals and at bedtime. 120 capsule 0   furosemide  (LASIX ) 20 MG tablet TAKE 1 TABLET(20 MG) BY MOUTH DAILY AS NEEDED FOR SWELLING 90 tablet 3   glimepiride  (AMARYL ) 2 MG tablet Take 1 tablet (2 mg total) by mouth daily before breakfast. (Patient not taking: Reported  on 09/15/2023) 90 tablet 1   hydroxypropyl methylcellulose (ISOPTO TEARS) 2.5 % ophthalmic solution Place 1 drop into both eyes 2 (two) times daily.     isosorbide  mononitrate (IMDUR ) 30 MG 24 hr tablet Take 1 tablet (30 mg total) by mouth daily. 90 tablet 2   metoprolol  succinate (TOPROL -XL) 50 MG 24 hr tablet TAKE 1 AND 1/2 TABLETS BY MOUTH TWICE DAILY 270 tablet 2   metoprolol  tartrate (LOPRESSOR ) 25 MG tablet Take 1 tablet twice daily as needed for palpitations (Patient not taking: Reported on 09/15/2023) 180 tablet 3   pantoprazole  (PROTONIX ) 40 MG tablet TAKE 1 TABLET BY MOUTH EVERY DAY 90 tablet 1   simvastatin  (ZOCOR ) 20 MG tablet Take 1 tablet (20 mg total) by mouth at bedtime. 90 tablet 3   warfarin (COUMADIN ) 5 MG tablet TAKE 1/2 TO 1 TABLET BY MOUTH DAILY AS DIRECTED BY COUMADIN  CLINIC 90 tablet 1  No current facility-administered medications for this visit.     Review of Systems    ***.  All other systems reviewed and are otherwise negative except as noted above.    Physical Exam    VS:  There were no vitals taken for this visit. , BMI There is no height or weight on file to calculate BMI.     GEN: Well nourished, well developed, in no acute distress. HEENT: normal. Neck: Supple, no JVD, carotid bruits, or masses. Cardiac: RRR, no murmurs, rubs, or gallops. No clubbing, cyanosis, edema.  Radials/DP/PT 2+ and equal bilaterally.  Respiratory:  Respirations regular and unlabored, clear to auscultation bilaterally. GI: Soft, nontender, nondistended, BS + x 4. MS: no deformity or atrophy. Skin: warm and dry, no rash. Neuro:  Strength and sensation are intact. Psych: Normal affect.  Accessory Clinical Findings    ECG personally reviewed by me today -    - no acute changes.   Lab Results  Component Value Date   WBC 4.9 09/15/2023   HGB 12.5 09/15/2023   HCT 38.4 09/15/2023   MCV 87.1 09/15/2023   PLT 209.0 09/15/2023   Lab Results  Component Value Date   CREATININE  0.97 09/15/2023   BUN 12 09/15/2023   NA 136 09/15/2023   K 3.7 09/15/2023   CL 100 09/15/2023   CO2 32 09/15/2023   Lab Results  Component Value Date   ALT 12 09/15/2023   AST 18 09/15/2023   ALKPHOS 62 09/15/2023   BILITOT 0.4 09/15/2023   Lab Results  Component Value Date   CHOL 163 11/27/2022   HDL 57.30 11/27/2022   LDLCALC 76 11/27/2022   LDLDIRECT 105.5 11/26/2010   TRIG 149.0 11/27/2022   CHOLHDL 3 11/27/2022    Lab Results  Component Value Date   HGBA1C 7.9 (H) 09/15/2023    Assessment & Plan    1.  ***      Jude Norton, NP 09/15/2023, 4:57 PM

## 2023-09-15 NOTE — Assessment & Plan Note (Signed)
 Cont on Isosorbide, Amlodipine Cont on metoprolol succinate 75 mg in the morning and metoprolol succinate 75 mg in the evening.  Continue to monitor HR/symptoms. She is high risk for recurrent atrial fibrillation given high incidence of atrial ectopy.  Continue warfarin.

## 2023-09-15 NOTE — Progress Notes (Signed)
 Subjective:  Patient ID: [name], female    DOB: 1934/09/22  Age: 89 y.o. MRN: [id number]  CC: Medical Management of Chronic Issues (6 mnth f/u, Pt states she has a buzzing noise in her ears and increase in back pain as well.)   HPI MARLET KORTE presents for PAF, LBP, HTN Pt fell at home on Mother's day. No LOC  Outpatient Medications Prior to Visit  Medication Sig Dispense Refill   ACCU-CHEK GUIDE TEST test strip 1 each by Other route 2 (two) times daily.     acetaminophen  (TYLENOL ) 500 MG tablet Take 500 mg by mouth every 6 (six) hours as needed.     ALPRAZolam  (XANAX ) 0.5 MG tablet TAKE 1/2 TO 1 TABLET BY MOUTH THREE TIMES DAILY AS NEEDED FOR ANXIETY. DO NOT TAKE WITH TRAMADOL . THIS IS A 30 DAY SUPPLY 90 tablet 1   amLODipine  (NORVASC ) 5 MG tablet TAKE 1 TABLET(5 MG) BY MOUTH DAILY 90 tablet 3   aspirin  81 MG tablet Take 81 mg by mouth daily.     Cholecalciferol (VITAMIN D ) 50 MCG (2000 UT) CAPS TAKE 1 CAPSULE BY MOUTH EVERY DAY 100 capsule 3   diclofenac  Sodium (VOLTAREN ) 1 % GEL Apply 2 g topically 4 (four) times daily. 150 g 1   dicyclomine  (BENTYL ) 10 MG capsule Take 1 capsule (10 mg total) by mouth 4 (four) times daily -  before meals and at bedtime. 120 capsule 0   hydroxypropyl methylcellulose (ISOPTO TEARS) 2.5 % ophthalmic solution Place 1 drop into both eyes 2 (two) times daily.     isosorbide  mononitrate (IMDUR ) 30 MG 24 hr tablet Take 1 tablet (30 mg total) by mouth daily. 90 tablet 2   metoprolol  succinate (TOPROL -XL) 50 MG 24 hr tablet TAKE 1 AND 1/2 TABLETS BY MOUTH TWICE DAILY 270 tablet 2   pantoprazole  (PROTONIX ) 40 MG tablet TAKE 1 TABLET BY MOUTH EVERY DAY 90 tablet 1   simvastatin  (ZOCOR ) 20 MG tablet Take 1 tablet (20 mg total) by mouth at bedtime. 90 tablet 3   warfarin (COUMADIN ) 5 MG tablet TAKE 1/2 TO 1 TABLET BY MOUTH DAILY AS DIRECTED BY COUMADIN  CLINIC 90 tablet 1   furosemide  (LASIX ) 20 MG tablet TAKE 1 TABLET(20 MG) BY MOUTH DAILY AS NEEDED  FOR SWELLING (Patient taking differently: Take 20 mg by mouth daily as needed for fluid.) 90 tablet 3   glimepiride  (AMARYL ) 2 MG tablet Take 1 tablet (2 mg total) by mouth daily before breakfast. (Patient not taking: Reported on 09/15/2023) 90 tablet 1   metoprolol  tartrate (LOPRESSOR ) 25 MG tablet Take 1 tablet twice daily as needed for palpitations (Patient not taking: Reported on 09/15/2023) 180 tablet 3   No facility-administered medications prior to visit.    ROS: Review of Systems  Constitutional:  Negative for activity change, appetite change, chills, fatigue and unexpected weight change.  HENT:  Negative for congestion, mouth sores and sinus pressure.   Eyes:  Negative for visual disturbance.  Respiratory:  Negative for cough and chest tightness.   Gastrointestinal:  Negative for abdominal pain and nausea.  Genitourinary:  Negative for difficulty urinating, frequency and vaginal pain.  Musculoskeletal:  Positive for back pain and gait problem.  Skin:  Negative for pallor and rash.  Neurological:  Negative for dizziness, tremors, weakness, numbness and headaches.  Psychiatric/Behavioral:  Negative for confusion, sleep disturbance and suicidal ideas. The patient is nervous/anxious.     Objective:  BP 112/70   Pulse 64  Temp 98 F (36.7 C) (Oral)   Ht 5\' 6"  (1.676 m)   Wt 192 lb (87.1 kg)   SpO2 97%   BMI 30.99 kg/m   BP Readings from Last 3 Encounters:  09/15/23 112/70  05/26/23 (!) 140/74  05/22/23 110/84    Wt Readings from Last 3 Encounters:  09/15/23 192 lb (87.1 kg)  08/13/23 192 lb (87.1 kg)  05/26/23 192 lb 6.4 oz (87.3 kg)    Physical Exam Constitutional:      General: She is not in acute distress.    Appearance: She is well-developed. She is obese.  HENT:     Head: Normocephalic.     Right Ear: External ear normal.     Left Ear: External ear normal.     Nose: Nose normal.  Eyes:     General:        Right eye: No discharge.        Left eye: No  discharge.     Conjunctiva/sclera: Conjunctivae normal.     Pupils: Pupils are equal, round, and reactive to light.  Neck:     Thyroid : No thyromegaly.     Vascular: No JVD.     Trachea: No tracheal deviation.  Cardiovascular:     Rate and Rhythm: Normal rate and regular rhythm.     Heart sounds: Normal heart sounds.  Pulmonary:     Effort: No respiratory distress.     Breath sounds: No stridor. No wheezing.  Abdominal:     General: Bowel sounds are normal. There is no distension.     Palpations: Abdomen is soft. There is no mass.     Tenderness: There is no abdominal tenderness. There is no guarding or rebound.  Musculoskeletal:        General: Tenderness present.     Cervical back: Normal range of motion and neck supple. No rigidity.     Right lower leg: No edema.     Left lower leg: No edema.  Lymphadenopathy:     Cervical: No cervical adenopathy.  Skin:    Findings: No erythema or rash.  Neurological:     Mental Status: Mental status is at baseline.     Cranial Nerves: No cranial nerve deficit.     Motor: No abnormal muscle tone.     Coordination: Coordination normal.     Gait: Gait abnormal.     Deep Tendon Reflexes: Reflexes normal.  Psychiatric:        Behavior: Behavior normal.        Thought Content: Thought content normal.        Judgment: Judgment normal.     Lab Results  Component Value Date   WBC 6.8 05/22/2023   HGB 11.7 (L) 05/22/2023   HCT 35.7 (L) 05/22/2023   PLT 305.0 05/22/2023   GLUCOSE 215 (H) 05/22/2023   CHOL 163 11/27/2022   TRIG 149.0 11/27/2022   HDL 57.30 11/27/2022   LDLDIRECT 105.5 11/26/2010   LDLCALC 76 11/27/2022   ALT 18 05/22/2023   AST 22 05/22/2023   NA 136 05/22/2023   K 3.8 05/22/2023   CL 97 05/22/2023   CREATININE 1.12 05/22/2023   BUN 10 05/22/2023   CO2 30 05/22/2023   TSH 1.660 10/22/2022   INR 2.2 09/11/2023   HGBA1C 8.3 (H) 05/22/2023    US  ABDOMEN LIMITED RUQ (LIVER/GB) Result Date: 05/15/2023 CLINICAL  DATA:  89 year old female with history of upper abdominal pain. EXAM: ULTRASOUND ABDOMEN LIMITED RIGHT UPPER QUADRANT  COMPARISON:  No prior abdominal ultrasound. CT of the abdomen and pelvis 05/15/2023. FINDINGS: Gallbladder: Numerous echogenic foci lying dependently in the gallbladder, some of which demonstrate posterior acoustic shadowing, compatible with a combination of biliary sludge and small gallstones measuring up to 7 mm. Gallbladder is moderately distended. Gallbladder wall thickness is normal at 3 mm. No pericholecystic fluid. Per report from the sonographer, there was no sonographic Murphy's sign on examination. Common bile duct: Diameter: 4 mm. Liver: No focal lesion identified. Within normal limits in parenchymal echogenicity. Portal vein is patent on color Doppler imaging with normal direction of blood flow towards the liver. Other: 2.4 x 2.9 x 2.0 cm hypoechoic lesion in the upper right retroperitoneum corresponds to right adrenal nodule as demonstrated on recent CT examination (previously characterized as an adrenal adenoma). Multiple cysts in the right kidney incidentally noted. IMPRESSION: 1. Cholelithiasis and biliary sludge in the gallbladder, without definitive sonographic evidence of acute cholecystitis at this time. Electronically Signed   By: Alexandria Angel M.D.   On: 05/15/2023 07:10   CT ABDOMEN PELVIS W CONTRAST Result Date: 05/15/2023 CLINICAL DATA:  Acute, nonlocalized abdominal pain. EXAM: CT ABDOMEN AND PELVIS WITH CONTRAST TECHNIQUE: Multidetector CT imaging of the abdomen and pelvis was performed using the standard protocol following bolus administration of intravenous contrast. RADIATION DOSE REDUCTION: This exam was performed according to the departmental dose-optimization program which includes automated exposure control, adjustment of the mA and/or kV according to patient size and/or use of iterative reconstruction technique. CONTRAST:  OMNIPAQUE  IOHEXOL  300 MG/ML   SOLN COMPARISON:  03/28/2021 abdominal MRI FINDINGS: Lower chest: Atheromatous calcification of the aorta and coronaries. Atelectasis at the right lung base. Hepatobiliary: No focal liver abnormality by CT, reference prior MRI.Distended gallbladder with layering calculi. There is some right upper quadrant inflammation with fat stranding, favored to be biliary. No ductal dilatation or ductal stone identified. Pancreas: Generalized atrophy Spleen: Unremarkable. Adrenals/Urinary Tract: History of right adrenal adenoma with stable size measuring 2.4 cm. No hydronephrosis or stone. Multiple bilateral renal cortical cysts. Known solid and cystic mass in the upper pole left kidney measuring 14 mm, previously 11. Unremarkable bladder. Stomach/Bowel: No obstruction. No visible bowel inflammation, including in the colon adjacent to the right upper quadrant fat stranding. Vascular/Lymphatic: No acute vascular abnormality. Multifocal atheromatous calcification of the aorta and iliacs. No mass or adenopathy. Reproductive:Hysterectomy Other: No ascites or pneumoperitoneum. Musculoskeletal: No acute abnormalities. Chronic L1 superior endplate fracture. Lumbar spine degeneration with L4-5 mild anterolisthesis and disc bulging causing foraminal stenosis greater on the right. IMPRESSION: 1. Cholelithiasis, gallbladder distension, and regional fat inflammation concerning for acute cholecystitis. Recommend ultrasound. 2. 14 mm enhancing left renal mass has mildly enlarged from a 2020 MRI, suspected renal cell carcinoma. 3. Other chronic findings are stable. Electronically Signed   By: Ronnette Coke M.D.   On: 05/15/2023 06:06   DG Chest 2 View Result Date: 05/14/2023 CLINICAL DATA:  Chest pain EXAM: CHEST - 2 VIEW COMPARISON:  10/11/2022 FINDINGS: Cardiac shadow is stable. Lungs are well aerated bilaterally. No focal infiltrate or effusion is seen. No bony abnormality is noted. IMPRESSION: No acute abnormality noted.  Electronically Signed   By: Violeta Grey M.D.   On: 05/14/2023 21:25    Assessment & Plan:   Problem List Items Addressed This Visit     Vitamin D  deficiency   On Vit D      Essential hypertension - Primary   Cont on Isosorbide , Amlodipine  Cont on metoprolol  succinate 75  mg in the morning and metoprolol  succinate 75 mg in the evening.  Continue to monitor HR/symptoms. She is high risk for recurrent atrial fibrillation given high incidence of atrial ectopy.  Continue warfarin.      Relevant Medications   furosemide  (LASIX ) 20 MG tablet   Other Relevant Orders   Comprehensive metabolic panel with GFR   Hemoglobin A1c   CBC with Differential/Platelet   DM2 (diabetes mellitus, type 2) (HCC)   Better  CBG 114-154 at home      Relevant Orders   Comprehensive metabolic panel with GFR   Hemoglobin A1c   CBC with Differential/Platelet   Low back pain   S/p a fall. Better X ray LS spine      Relevant Orders   DG Lumbar Spine 2-3 Views      Meds ordered this encounter  Medications   furosemide  (LASIX ) 20 MG tablet    Sig: TAKE 1 TABLET(20 MG) BY MOUTH DAILY AS NEEDED FOR SWELLING    Dispense:  90 tablet    Refill:  3      Follow-up: Return in about 3 months (around 12/16/2023) for a follow-up visit.  Anitra Barn, MD

## 2023-09-16 ENCOUNTER — Ambulatory Visit: Admitting: Nurse Practitioner

## 2023-09-16 ENCOUNTER — Ambulatory Visit: Payer: Self-pay | Admitting: Internal Medicine

## 2023-09-16 DIAGNOSIS — I471 Supraventricular tachycardia, unspecified: Secondary | ICD-10-CM

## 2023-09-16 DIAGNOSIS — E785 Hyperlipidemia, unspecified: Secondary | ICD-10-CM

## 2023-09-16 DIAGNOSIS — I480 Paroxysmal atrial fibrillation: Secondary | ICD-10-CM

## 2023-09-16 DIAGNOSIS — I447 Left bundle-branch block, unspecified: Secondary | ICD-10-CM

## 2023-09-16 DIAGNOSIS — E119 Type 2 diabetes mellitus without complications: Secondary | ICD-10-CM

## 2023-09-16 DIAGNOSIS — I10 Essential (primary) hypertension: Secondary | ICD-10-CM

## 2023-09-16 DIAGNOSIS — I491 Atrial premature depolarization: Secondary | ICD-10-CM

## 2023-09-16 DIAGNOSIS — R002 Palpitations: Secondary | ICD-10-CM

## 2023-09-16 DIAGNOSIS — I2510 Atherosclerotic heart disease of native coronary artery without angina pectoris: Secondary | ICD-10-CM

## 2023-09-23 NOTE — Telephone Encounter (Signed)
 Spoke with the pt and she has stated that the glimepiride  (AMARYL ) 2 MG tablet is making her dizzy even after cutting the pill in half. Pt is asking if there is another medications she can try and if so can she get a sample.

## 2023-09-29 ENCOUNTER — Other Ambulatory Visit: Payer: Self-pay | Admitting: Cardiology

## 2023-09-30 ENCOUNTER — Other Ambulatory Visit: Payer: Self-pay | Admitting: Internal Medicine

## 2023-10-02 ENCOUNTER — Encounter: Payer: Self-pay | Admitting: Internal Medicine

## 2023-10-02 ENCOUNTER — Ambulatory Visit: Payer: Medicare HMO | Attending: Internal Medicine | Admitting: Internal Medicine

## 2023-10-02 VITALS — BP 162/80 | HR 58 | Ht 66.0 in | Wt 186.6 lb

## 2023-10-02 DIAGNOSIS — E785 Hyperlipidemia, unspecified: Secondary | ICD-10-CM | POA: Diagnosis not present

## 2023-10-02 DIAGNOSIS — I447 Left bundle-branch block, unspecified: Secondary | ICD-10-CM

## 2023-10-02 DIAGNOSIS — I480 Paroxysmal atrial fibrillation: Secondary | ICD-10-CM

## 2023-10-02 DIAGNOSIS — R002 Palpitations: Secondary | ICD-10-CM | POA: Diagnosis not present

## 2023-10-02 NOTE — Progress Notes (Signed)
 OFFICE NOTE  Chief Complaint:  Follow-up   Primary Care Physician: Genia Kettering, MD  HPI:  [name]  is a 89 year old female with a history of paroxysmal atrial fibrillation and SVT in the past. This happened when she underwent a Myoview  for chest pain in the hospital. She was placed on metoprolol  succinate 25 b.i.d. and has done well with that with improvement in her palpitations. Her echo showed an EF greater than 55% with some mild aortic insufficiency and an aneurysmal atrial septum. She is on simvastatin  as well as Coumadin  managed by you. She's recently had some abnormal labs which showed borderline diabetes. She's not currently a medication for that. She reports occasional intermittent chest pain. She underwent a cardia catheterization in the past which showed mild to moderate nonobstructive coronary disease which is managed medically. She does also have bilateral venous insufficiency of the lower chin and knees and has worn compression stockings with improvement in her swelling and symptoms.  [name] returns today without any new complaints. She denies any shortness of breath or chest pain. She does get some occasional chest wall pain and is relieved with tramadol .  I saw [name] back in the office today. Overall she reports she is doing very well. She's had very few episodes of palpitations. She has had a decreased dose in her amlodipine  to 5 mg daily, likely due to leg swelling. She is now taking Lasix  as needed for edema. She's had a few episodes of racing heart for which she is to perform vagal maneuvers successfully. EKG shows sinus rhythm with PVCs and PACs but no evidence of atrial fibrillation in the office today. She is on warfarin anticoagulation. We will check the INR in the office today.  [name] returns today for follow-up. Unfortunately her son died this past 11-02-2023 of leukemia. She says he's been in and out of the hospital for several  months. She has had it small increase in palpitations recently but seems to be controlled with taking and a half dose of metoprolol . She denies any chest pain although has some pain behind her right shoulder blade. This is intermittent and has improved. Is not associated with exertion or relieved by rest. Her EKG today shows normal sinus rhythm without ischemia. Recent A1c was 6.8-she is not on diabetes medications. I do not see a recent lipid profile though she is on simvastatin  20 mg every OD.  04/24/2016  [name] returns today for follow-up. She has no new complaints. It's been about a year since she had the loss of her son due to leukemia. She seems to be doing fairly well although is routinely very calm and demeanor. Her blood pressure is well-controlled. Her INR was therapeutic today. She denies any worsening swelling, chest pain or shortness of breath. She denies any bleeding problems. She's not had any recurrent atrial fibrillation or palpitations that she is aware of. Her last echo was in 2012 and at the time she had mild nonobstructive coronary disease by catheterization. She was also noted to have moderate tricuspid regurgitation and some LVH. Her EKG today shows moderate LVH by voltage criteria.  04/28/2017  [name] returns today for follow-up.  She denies any chest pain or worsening shortness of breath.  She saw her primary care provider yesterday no medicine changes were made.  She is contemplating removing a small mass on the left inner thigh.  He has an appointment with a surgeon coming up and we may need  to hold her warfarin for that.  She has had no significant recurrent atrial fibrillation.  She is in sinus today.  She prefers to stay on warfarin.  INR has been therapeutic and is adjusted every 6 weeks.  04/29/2018  [name] seen today for routine annual follow-up.  Overall she is without complaints.  Her warfarin testing has been stable.  Blood pressures well controlled  today.  She denies chest pain or worsening shortness of breath.  Cholesterol has been at goal.  EKG personally reviewed shows sinus bradycardia and voltage criteria for LVH at 51.  04/2018  [name] seen today for follow-up.  She is accompanied by her daughter.  Overall she seems to be doing well.  She denies any chest pain or worsening shortness of breath.  She denies any symptomatic atrial fibrillation.  She remains on warfarin for anticoagulation.  She also takes metoprolol  succinate which we recently increased to 50 mg twice daily for palpitations.  She was in the ER with A. fib and RVR.  She is noted since increasing her beta-blocker that her palpitations have improved.  12/27/2019  [name] returns for follow-up.  Overall she is doing well.  She has been having some complaints about a tendinitis in her left foot.  Otherwise blood pressure is controlled.  She has rare palpitations.  She is due for repeat labs although sees her primary care provider next month.  05/17/2020  [name] seen today in follow-up.  She recently had a COVID-19 infection in early January.  She was subsequently seen by Ervin Heath, PA-C in the office.  She had noted increases in palpitations.  He advised her to take an extra half tablet of Toprol  if necessary.  She says this really has not helped much.  She gets palpitations mostly at night and she says she cannot sleep basically every other night.  Her symptoms that apparently worsened and she went to the ER for this.  She was noted to be in sinus rhythm there and did not have any changes to her medications.  She is on warfarin.  Heart rate today is low in the 60s without irregularity.  It is difficult to know whether she is having recurrent A. fib or whether this could be sequelae of COVID-19 infection.  She does have a history of moderate nonobstructive coronary disease by cath but back in 2012.  09/27/2020  [name] returns today for follow-up of her  palpitations.  She reports in general they have improved.  Despite showing recurrent episodes of SVT on a monitor, I had advised her to increase her Toprol  to 100 mg twice daily.  She currently continues to take Toprol -XL 75 mg twice daily.  She said that she did try the higher dose but felt unwell.  Currently blood pressure is minimally elevated at 144/82.  EKG shows a sinus rhythm with first-degree AV block.  10/02/2023  [name] is here today for follow-up.  She reports pretty good control of her palpitations.  When she last saw Sherline Distel she was given short acting metoprolol  for breakthroughs however she said when she took that she felt somewhat worse.  She generally likes to take an extra half tablet of the Toprol  which seems to work for her.  Blood pressure initially was elevated today 162/80 on recheck came down to 140/78.  She says she has not taken her amlodipine  today.  She denies any chest pain.  She denies any A-fib.  EKG does not show  any A-fib today but shows a chronic left bundle branch block.  PMHx:  Past Medical History:  Diagnosis Date   Anxiety state, unspecified    Bronchitis, not specified as acute or chronic    Diaphragmatic hernia without mention of obstruction or gangrene    Dyslipidemia    Essential hypertension    Irritable bowel syndrome    Mitral valve disorders(424.0)    a. 08/2010 Echo: EF >55%, mild MR, mod TR, trace AI.   Non-obstructive CAD    a. 07/2010 Lexiscan MV: no ischemia, EF 78%; b. 07/2010 Cath: LM nl, LAD50/60p, 40 apical, LCX nl, RCA dominant, 30/40p, 3m.   Osteoarthrosis, unspecified whether generalized or localized, unspecified site    Osteoporosis, unspecified    Other abnormal glucose    PAF (paroxysmal atrial fibrillation) (HCC)    a. CHA2DS2VASc = 4-->coumadin .   Phlebitis and thrombophlebitis of superficial vessels of lower extremities    PSVT (paroxysmal supraventricular tachycardia) (HCC)    a. 05/2012 Holter: short bursts of PSVT  noted-->Managed with toprol .   Unspecified venous (peripheral) insufficiency    Unspecified vitamin D  deficiency     Past Surgical History:  Procedure Laterality Date   CARDIAC CATHETERIZATION  07/30/2010   nonobstructive CAD, 20-40% RCA stenosis, 50-60% eccentric LAD stenosis more prox, 40% LAD stenosis more distal, EF 65%   TRANSTHORACIC ECHOCARDIOGRAM  09/03/2010   EF=>55%; mild MR; mod TR; AV mildly sclerotic with trace regurg; mild pulm valve regurg   VESICOVAGINAL FISTULA CLOSURE W/ TAH      FAMHx:  Family History  Problem Relation Age of Onset   Stroke Mother    Stroke Father        also heart disease   Clotting disorder Paternal Grandmother        blood clot   Stroke Brother        also heart disease    SOCHx:   reports that she has never smoked. She has never been exposed to tobacco smoke. She has never used smokeless tobacco. She reports that she does not drink alcohol and does not use drugs.  ALLERGIES:  Allergies  Allergen Reactions   Acetaminophen -Codeine      Made me depressed   Clarithromycin     REACTION: unspecified   Fosamax  [Alendronate  Sodium]     achy   Guaifenesin  Er     itching   Metformin     REACTION: pt states INTOL to Metformin felt drained   Prandin  [Repaglinide ]     Prandin  dropped CBGs to 66 once - she stopped it.   Tape     Adhesive tape---rash    ROS: Pertinent items noted in HPI and remainder of comprehensive ROS otherwise negative.  HOME MEDS: Current Outpatient Medications  Medication Sig Dispense Refill   ACCU-CHEK GUIDE TEST test strip 1 each by Other route 2 (two) times daily.     acetaminophen  (TYLENOL ) 500 MG tablet Take 500 mg by mouth every 6 (six) hours as needed.     ALPRAZolam  (XANAX ) 0.5 MG tablet TAKE 1/2 TO 1 TABLET BY MOUTH THREE TIMES DAILY AS NEEDED FOR ANXIETY. DO NOT TAKE WITH TRAMADOL . THIS IS A 30 DAY SUPPLY 90 tablet 1   amLODipine  (NORVASC ) 5 MG tablet TAKE 1 TABLET(5 MG) BY MOUTH DAILY 90 tablet 3    aspirin  81 MG tablet Take 81 mg by mouth daily.     Cholecalciferol (VITAMIN D ) 50 MCG (2000 UT) CAPS TAKE 1 CAPSULE BY MOUTH EVERY DAY 100 capsule 3  diclofenac  Sodium (VOLTAREN ) 1 % GEL Apply 2 g topically 4 (four) times daily. 150 g 1   dicyclomine  (BENTYL ) 10 MG capsule Take 1 capsule (10 mg total) by mouth 4 (four) times daily -  before meals and at bedtime. 120 capsule 0   furosemide  (LASIX ) 20 MG tablet TAKE 1 TABLET(20 MG) BY MOUTH DAILY AS NEEDED FOR SWELLING 90 tablet 3   glimepiride  (AMARYL ) 2 MG tablet Take 1 tablet (2 mg total) by mouth daily before breakfast. 90 tablet 1   hydroxypropyl methylcellulose (ISOPTO TEARS) 2.5 % ophthalmic solution Place 1 drop into both eyes 2 (two) times daily.     isosorbide  mononitrate (IMDUR ) 30 MG 24 hr tablet TAKE 1 TABLET BY MOUTH EVERY DAY 90 tablet 2   pantoprazole  (PROTONIX ) 40 MG tablet TAKE 1 TABLET BY MOUTH EVERY DAY 90 tablet 1   simvastatin  (ZOCOR ) 20 MG tablet Take 1 tablet (20 mg total) by mouth at bedtime. 90 tablet 3   warfarin (COUMADIN ) 5 MG tablet TAKE 1/2 TO 1 TABLET BY MOUTH DAILY AS DIRECTED BY COUMADIN  CLINIC 90 tablet 1   metoprolol  succinate (TOPROL -XL) 50 MG 24 hr tablet TAKE 1 AND 1/2 TABLETS BY MOUTH TWICE DAILY (Patient not taking: Reported on 10/02/2023) 270 tablet 2   metoprolol  tartrate (LOPRESSOR ) 25 MG tablet Take 1 tablet twice daily as needed for palpitations (Patient not taking: Reported on 10/02/2023) 180 tablet 3   No current facility-administered medications for this visit.    LABS/IMAGING: No results found for this or any previous visit (from the past 48 hours). No results found.  VITALS: BP (!) 162/80 (BP Location: Left Arm, Patient Position: Sitting)   Pulse (!) 58   Ht 5' 6 (1.676 m)   Wt 186 lb 9.6 oz (84.6 kg)   SpO2 99%   BMI 30.12 kg/m   EXAM: General appearance: alert and no distress Neck: no carotid bruit, no JVD and thyroid  not enlarged, symmetric, no tenderness/mass/nodules Lungs: clear  to auscultation bilaterally Heart: regular rate and rhythm, S1, S2 normal, no murmur, click, rub or gallop Abdomen: soft, non-tender; bowel sounds normal; no masses,  no organomegaly Extremities: extremities normal, atraumatic, no cyanosis or edema Pulses: 2+ and symmetric Skin: Skin color, texture, turgor normal. No rashes or lesions Neurologic: Grossly normal Psych: Pleasant  EKG: EKG Interpretation Date/Time:  Friday October 02 2023 09:34:23 EDT Ventricular Rate:  58 PR Interval:  248 QRS Duration:  136 QT Interval:  458 QTC Calculation: 449 R Axis:   -49  Text Interpretation: Sinus bradycardia with 1st degree A-V block Left axis deviation Left bundle branch block No significant change since last tracing Confirmed by Dinah Franco [phone]) on 10/02/2023 9:39:58 AM    ASSESSMENT: Paroxysmal atrial fibrillation on warfarin - CHADSVASC score of 4 COVID-19 infection (04/2019) Hypertension-controlled Dyslipidemia-on statin Overweight Moderate, non-obstructive CAD by cath in 2012 PSVT - controlled by b-blocker and vagal maneuvers LVH, mild to moderate TR by echo 2012 LBBB  PLAN: 1.   Ms. Wiggins seems to have very few breakthrough palpitations.  She tends to use the long-acting Toprol  take an extra half tablet as needed.  Will discontinue the short acting metoprolol  since she said it may have caused her more issues.  Repeat blood pressure is still little elevated however she did not take her medicines today.  No evidence of recurrent A-fib.  She reports her warfarin has been very well-controlled.  Plan follow-up in 6 months with [facility] or sooner as necessary.  Aimee Houseman  C. Jaselyn Nahm, MD, [facility], FNLA, FACP    [facility]  Medical Director of the [facility]  [facility] Diplomate of the American Board of Clinical Lipidology Attending Cardiologist  Direct Dial: [phone]  Fax: [phone]  Website:   [email or web address] Jamear Carbonneau 10/02/2023, 9:40 AM

## 2023-10-02 NOTE — Patient Instructions (Signed)
 Medication Instructions:  STOP Metoprolol  Tartrate  *If you need a refill on your cardiac medications before your next appointment, please call your pharmacy*  Follow-Up: At [facility], you and your health needs are our priority.  As part of our continuing mission to provide you with exceptional heart care, our providers are all part of one team.  This team includes your primary Cardiologist (physician) and Advanced Practice Providers or APPs (Physician Assistants and Nurse Practitioners) who all work together to provide you with the care you need, when you need it.  Your next appointment:   6 months with Marlana Silvan NP

## 2023-10-08 ENCOUNTER — Telehealth: Payer: Self-pay

## 2023-10-22 ENCOUNTER — Telehealth: Payer: Self-pay | Admitting: Internal Medicine

## 2023-10-22 NOTE — Telephone Encounter (Signed)
 Called pt to get her bone density scheduled but she refuses and before we cancel after the 4th attempt she wants her nurse to contact her to explain more in depth onto what her doctor order this for and why do she needs it.. Please advise, Thanks

## 2023-10-23 ENCOUNTER — Ambulatory Visit: Attending: Internal Medicine

## 2023-10-23 DIAGNOSIS — Z7901 Long term (current) use of anticoagulants: Secondary | ICD-10-CM | POA: Diagnosis not present

## 2023-10-23 DIAGNOSIS — I480 Paroxysmal atrial fibrillation: Secondary | ICD-10-CM | POA: Diagnosis not present

## 2023-10-23 LAB — POCT INR: INR: 2.3 (ref 2.0–3.0)

## 2023-10-23 NOTE — Patient Instructions (Signed)
 Description   Continue taking Warfarin 1 tablet daily. Repeat INR in 6 weeks.  Call [phone] with an warfarin related questions. *Consistent amount of greens every week*

## 2023-10-23 NOTE — Progress Notes (Signed)
Please see anticoagulation encounter.

## 2023-10-24 ENCOUNTER — Other Ambulatory Visit: Payer: Self-pay | Admitting: Nurse Practitioner

## 2023-10-24 DIAGNOSIS — E785 Hyperlipidemia, unspecified: Secondary | ICD-10-CM

## 2023-11-24 ENCOUNTER — Other Ambulatory Visit: Payer: Self-pay | Admitting: Internal Medicine

## 2023-11-28 ENCOUNTER — Other Ambulatory Visit: Payer: Self-pay | Admitting: Family Medicine

## 2023-12-01 ENCOUNTER — Other Ambulatory Visit: Payer: Self-pay | Admitting: Nurse Practitioner

## 2023-12-04 ENCOUNTER — Ambulatory Visit: Attending: Internal Medicine

## 2023-12-04 DIAGNOSIS — Z7901 Long term (current) use of anticoagulants: Secondary | ICD-10-CM | POA: Diagnosis not present

## 2023-12-04 DIAGNOSIS — I480 Paroxysmal atrial fibrillation: Secondary | ICD-10-CM

## 2023-12-04 LAB — POCT INR: INR: 2.1 (ref 2.0–3.0)

## 2023-12-04 NOTE — Patient Instructions (Signed)
 Description   Inr 2.1:Continue taking Warfarin 1 tablet daily.  Repeat INR in 6 weeks.  Call [phone] with an warfarin related questions. *Consistent amount of greens every week*

## 2023-12-04 NOTE — Progress Notes (Signed)
 Description   Inr 2.1:Continue taking Warfarin 1 tablet daily.  Repeat INR in 6 weeks.  Call [phone] with an warfarin related questions. *Consistent amount of greens every week*

## 2023-12-16 ENCOUNTER — Other Ambulatory Visit: Payer: Self-pay | Admitting: Internal Medicine

## 2023-12-17 ENCOUNTER — Ambulatory Visit: Admitting: Internal Medicine

## 2023-12-17 ENCOUNTER — Encounter: Payer: Self-pay | Admitting: Internal Medicine

## 2023-12-17 VITALS — BP 129/74 | HR 61 | Temp 97.4°F | Ht 66.0 in | Wt 185.0 lb

## 2023-12-17 DIAGNOSIS — F419 Anxiety disorder, unspecified: Secondary | ICD-10-CM | POA: Diagnosis not present

## 2023-12-17 DIAGNOSIS — I10 Essential (primary) hypertension: Secondary | ICD-10-CM | POA: Diagnosis not present

## 2023-12-17 DIAGNOSIS — I700 Atherosclerosis of aorta: Secondary | ICD-10-CM

## 2023-12-17 DIAGNOSIS — E119 Type 2 diabetes mellitus without complications: Secondary | ICD-10-CM

## 2023-12-17 LAB — COMPREHENSIVE METABOLIC PANEL WITH GFR
ALT: 13 U/L (ref 0–35)
AST: 19 U/L (ref 0–37)
Albumin: 4.3 g/dL (ref 3.5–5.2)
Alkaline Phosphatase: 54 U/L (ref 39–117)
BUN: 14 mg/dL (ref 6–23)
CO2: 32 meq/L (ref 19–32)
Calcium: 9 mg/dL (ref 8.4–10.5)
Chloride: 99 meq/L (ref 96–112)
Creatinine, Ser: 1.06 mg/dL (ref 0.40–1.20)
GFR: 47.34 mL/min — ABNORMAL LOW (ref >60.00)
Glucose, Bld: 177 mg/dL — ABNORMAL HIGH (ref 70–99)
Potassium: 3.9 meq/L (ref 3.5–5.1)
Sodium: 136 meq/L (ref 135–145)
Total Bilirubin: 0.6 mg/dL (ref 0.2–1.2)
Total Protein: 7.3 g/dL (ref 6.0–8.3)

## 2023-12-17 LAB — CBC WITH DIFFERENTIAL/PLATELET
Basophils Absolute: 0 K/uL (ref 0.0–0.1)
Basophils Relative: 0.7 % (ref 0.0–3.0)
Eosinophils Absolute: 0 K/uL (ref 0.0–0.7)
Eosinophils Relative: 1 % (ref 0.0–5.0)
HCT: 38.4 % (ref 36.0–46.0)
Hemoglobin: 12.7 g/dL (ref 12.0–15.0)
Lymphocytes Relative: 23 % (ref 12.0–46.0)
Lymphs Abs: 1 K/uL (ref 0.7–4.0)
MCHC: 33.1 g/dL (ref 30.0–36.0)
MCV: 88.1 fl (ref 78.0–100.0)
Monocytes Absolute: 0.4 K/uL (ref 0.1–1.0)
Monocytes Relative: 8.1 % (ref 3.0–12.0)
Neutro Abs: 2.9 K/uL (ref 1.4–7.7)
Neutrophils Relative %: 67.2 % (ref 43.0–77.0)
Platelets: 211 K/uL (ref 150.0–400.0)
RBC: 4.36 Mil/uL (ref 3.87–5.11)
RDW: 14.2 % (ref 11.5–15.5)
WBC: 4.4 K/uL (ref 4.0–10.5)

## 2023-12-17 LAB — HEMOGLOBIN A1C: Hgb A1c MFr Bld: 8 % — ABNORMAL HIGH (ref 4.6–6.5)

## 2023-12-17 MED ORDER — GLIMEPIRIDE 2 MG PO TABS: 1.0000 mg | ORAL_TABLET | Freq: Every day | ORAL | Status: DC

## 2023-12-17 NOTE — Assessment & Plan Note (Signed)
 Cont on Isosorbide, Amlodipine Cont on metoprolol succinate 75 mg in the morning and metoprolol succinate 75 mg in the evening.  Continue to monitor HR/symptoms. She is high risk for recurrent atrial fibrillation given high incidence of atrial ectopy.  Continue warfarin.

## 2023-12-17 NOTE — Assessment & Plan Note (Addendum)
 C/o problems w/Glimepiride . Started having dizziness when started taking them a few weeks ago. Stopped taking them and then started back this week, only two days with no dizziness D/c Glimepiride  Will try to manage DM2 w/diet. If high sugars - restart Amaryl  ( Glimepiride ) 2 mg 1/2 tablet daily

## 2023-12-17 NOTE — Progress Notes (Signed)
 Subjective:  Patient ID: [name], female    DOB: 13-Apr-1935  Age: 89 y.o. MRN: [id number]  CC: Follow-up (53mo; want to discuss Glimepiride . Started having dizziness when started taking them a few weeks ago. Stopped taking them and then started back this week, only two days with no dizziness)   HPI [name] presents for Follow-up - want to discuss Glimepiride . Started having dizziness when started taking them a few weeks ago. Stopped taking them and then started back this week, only two days with no dizziness  F/u DM, HTN, anxiety  Outpatient Medications Prior to Visit  Medication Sig Dispense Refill   ACCU-CHEK GUIDE TEST test strip 1 each by Other route 2 (two) times daily.     acetaminophen  (TYLENOL ) 500 MG tablet Take 500 mg by mouth every 6 (six) hours as needed.     ALPRAZolam  (XANAX ) 0.5 MG tablet TAKE 1/2 TO 1 TABLET BY MOUTH THREE TIMES DAILY AS NEEDED FOR ANXIETY. DO NOT TAKE WITH TRAMADOL . THIS IS A 30 DAY SUPPLY 90 tablet 3   amLODipine  (NORVASC ) 5 MG tablet TAKE 1 TABLET(5 MG) BY MOUTH DAILY 90 tablet 3   aspirin  81 MG tablet Take 81 mg by mouth daily.     Cholecalciferol (VITAMIN D ) 50 MCG (2000 UT) CAPS TAKE 1 CAPSULE BY MOUTH EVERY DAY 100 capsule 3   diclofenac  Sodium (VOLTAREN ) 1 % GEL Apply 2 g topically 4 (four) times daily. 150 g 1   dicyclomine  (BENTYL ) 10 MG capsule Take 1 capsule (10 mg total) by mouth 4 (four) times daily -  before meals and at bedtime. 120 capsule 0   furosemide  (LASIX ) 20 MG tablet TAKE 1 TABLET(20 MG) BY MOUTH DAILY AS NEEDED FOR SWELLING 90 tablet 3   hydroxypropyl methylcellulose (ISOPTO TEARS) 2.5 % ophthalmic solution Place 1 drop into both eyes 2 (two) times daily.     hyoscyamine  (LEVSIN ) 0.125 MG tablet TAKE 1 TABLET BY MOUTH EVERY 6 HOURS FOR UP TO 10 DAYS AS NEEDED FOR CRAMPING 360 tablet 0   isosorbide  mononitrate (IMDUR ) 30 MG 24 hr tablet TAKE 1 TABLET BY MOUTH EVERY DAY 90 tablet 2   metoprolol  succinate (TOPROL -XL)  50 MG 24 hr tablet TAKE 1 AND 1/2 TABLETS BY MOUTH TWICE DAILY 270 tablet 2   pantoprazole  (PROTONIX ) 40 MG tablet TAKE 1 TABLET BY MOUTH EVERY DAY 90 tablet 3   simvastatin  (ZOCOR ) 20 MG tablet TAKE 1 TABLET(20 MG) BY MOUTH AT BEDTIME 90 tablet 3   warfarin (COUMADIN ) 5 MG tablet TAKE 1/2 TO 1 TABLET BY MOUTH DAILY AS DIRECTED BY COUMADIN  CLINIC 90 tablet 1   glimepiride  (AMARYL ) 2 MG tablet TAKE 1 TABLET(2 MG) BY MOUTH DAILY BEFORE BREAKFAST 90 tablet 1   No facility-administered medications prior to visit.    ROS: Review of Systems  Constitutional:  Negative for activity change, appetite change, chills, fatigue and unexpected weight change.  HENT:  Negative for congestion, mouth sores and sinus pressure.   Eyes:  Negative for visual disturbance.  Respiratory:  Negative for cough and chest tightness.   Gastrointestinal:  Negative for abdominal pain and nausea.  Genitourinary:  Negative for difficulty urinating, frequency and vaginal pain.  Musculoskeletal:  Negative for back pain and gait problem.  Skin:  Negative for pallor and rash.  Neurological:  Positive for dizziness. Negative for tremors, weakness, numbness and headaches.  Psychiatric/Behavioral:  Positive for decreased concentration. Negative for confusion, sleep disturbance and suicidal ideas. The patient is  nervous/anxious.     Objective:  BP 129/74   Pulse 61   Temp (!) 97.4 F (36.3 C)   Ht 5' 6 (1.676 m)   Wt 185 lb (83.9 kg)   SpO2 97%   BMI 29.86 kg/m   BP Readings from Last 3 Encounters:  12/17/23 129/74  10/02/23 (!) 162/80  09/15/23 112/70    Wt Readings from Last 3 Encounters:  12/17/23 185 lb (83.9 kg)  10/02/23 186 lb 9.6 oz (84.6 kg)  09/15/23 192 lb (87.1 kg)    Physical Exam Constitutional:      General: She is not in acute distress.    Appearance: She is well-developed. She is obese.  HENT:     Head: Normocephalic.     Right Ear: External ear normal.     Left Ear: External ear normal.      Nose: Nose normal.  Eyes:     General:        Right eye: No discharge.        Left eye: No discharge.     Conjunctiva/sclera: Conjunctivae normal.     Pupils: Pupils are equal, round, and reactive to light.  Neck:     Thyroid : No thyromegaly.     Vascular: No JVD.     Trachea: No tracheal deviation.  Cardiovascular:     Rate and Rhythm: Normal rate and regular rhythm.     Heart sounds: Normal heart sounds.  Pulmonary:     Effort: No respiratory distress.     Breath sounds: No stridor. No wheezing.  Abdominal:     General: Bowel sounds are normal. There is no distension.     Palpations: Abdomen is soft. There is no mass.     Tenderness: There is no abdominal tenderness. There is no guarding or rebound.  Musculoskeletal:        General: No tenderness.     Cervical back: Normal range of motion and neck supple. No rigidity.     Right lower leg: No edema.     Left lower leg: No edema.  Lymphadenopathy:     Cervical: No cervical adenopathy.  Skin:    Findings: No erythema or rash.  Neurological:     Mental Status: Mental status is at baseline.     Cranial Nerves: No cranial nerve deficit.     Motor: No abnormal muscle tone.     Coordination: Coordination normal.     Deep Tendon Reflexes: Reflexes normal.  Psychiatric:        Behavior: Behavior normal.        Thought Content: Thought content normal.        Judgment: Judgment normal.     Lab Results  Component Value Date   WBC 4.9 09/15/2023   HGB 12.5 09/15/2023   HCT 38.4 09/15/2023   PLT 209.0 09/15/2023   GLUCOSE 173 (H) 09/15/2023   CHOL 163 11/27/2022   TRIG 149.0 11/27/2022   HDL 57.30 11/27/2022   LDLDIRECT 105.5 11/26/2010   LDLCALC 76 11/27/2022   ALT 12 09/15/2023   AST 18 09/15/2023   NA 136 09/15/2023   K 3.7 09/15/2023   CL 100 09/15/2023   CREATININE 0.97 09/15/2023   BUN 12 09/15/2023   CO2 32 09/15/2023   TSH 1.660 10/22/2022   INR 2.1 12/04/2023   HGBA1C 7.9 (H) 09/15/2023    US   ABDOMEN LIMITED RUQ (LIVER/GB) Result Date: 05/15/2023 CLINICAL DATA:  89 year old female with history of upper abdominal pain. EXAM:  ULTRASOUND ABDOMEN LIMITED RIGHT UPPER QUADRANT COMPARISON:  No prior abdominal ultrasound. CT of the abdomen and pelvis 05/15/2023. FINDINGS: Gallbladder: Numerous echogenic foci lying dependently in the gallbladder, some of which demonstrate posterior acoustic shadowing, compatible with a combination of biliary sludge and small gallstones measuring up to 7 mm. Gallbladder is moderately distended. Gallbladder wall thickness is normal at 3 mm. No pericholecystic fluid. Per report from the sonographer, there was no sonographic Murphy's sign on examination. Common bile duct: Diameter: 4 mm. Liver: No focal lesion identified. Within normal limits in parenchymal echogenicity. Portal vein is patent on color Doppler imaging with normal direction of blood flow towards the liver. Other: 2.4 x 2.9 x 2.0 cm hypoechoic lesion in the upper right retroperitoneum corresponds to right adrenal nodule as demonstrated on recent CT examination (previously characterized as an adrenal adenoma). Multiple cysts in the right kidney incidentally noted. IMPRESSION: 1. Cholelithiasis and biliary sludge in the gallbladder, without definitive sonographic evidence of acute cholecystitis at this time. Electronically Signed   By: Toribio Aye M.D.   On: 05/15/2023 07:10   CT ABDOMEN PELVIS W CONTRAST Result Date: 05/15/2023 CLINICAL DATA:  Acute, nonlocalized abdominal pain. EXAM: CT ABDOMEN AND PELVIS WITH CONTRAST TECHNIQUE: Multidetector CT imaging of the abdomen and pelvis was performed using the standard protocol following bolus administration of intravenous contrast. RADIATION DOSE REDUCTION: This exam was performed according to the departmental dose-optimization program which includes automated exposure control, adjustment of the mA and/or kV according to patient size and/or use of iterative  reconstruction technique. CONTRAST:  OMNIPAQUE  IOHEXOL  300 MG/ML  SOLN COMPARISON:  03/28/2021 abdominal MRI FINDINGS: Lower chest: Atheromatous calcification of the aorta and coronaries. Atelectasis at the right lung base. Hepatobiliary: No focal liver abnormality by CT, reference prior MRI.Distended gallbladder with layering calculi. There is some right upper quadrant inflammation with fat stranding, favored to be biliary. No ductal dilatation or ductal stone identified. Pancreas: Generalized atrophy Spleen: Unremarkable. Adrenals/Urinary Tract: History of right adrenal adenoma with stable size measuring 2.4 cm. No hydronephrosis or stone. Multiple bilateral renal cortical cysts. Known solid and cystic mass in the upper pole left kidney measuring 14 mm, previously 11. Unremarkable bladder. Stomach/Bowel: No obstruction. No visible bowel inflammation, including in the colon adjacent to the right upper quadrant fat stranding. Vascular/Lymphatic: No acute vascular abnormality. Multifocal atheromatous calcification of the aorta and iliacs. No mass or adenopathy. Reproductive:Hysterectomy Other: No ascites or pneumoperitoneum. Musculoskeletal: No acute abnormalities. Chronic L1 superior endplate fracture. Lumbar spine degeneration with L4-5 mild anterolisthesis and disc bulging causing foraminal stenosis greater on the right. IMPRESSION: 1. Cholelithiasis, gallbladder distension, and regional fat inflammation concerning for acute cholecystitis. Recommend ultrasound. 2. 14 mm enhancing left renal mass has mildly enlarged from a 2020 MRI, suspected renal cell carcinoma. 3. Other chronic findings are stable. Electronically Signed   By: Dorn Roulette M.D.   On: 05/15/2023 06:06   DG Chest 2 View Result Date: 05/14/2023 CLINICAL DATA:  Chest pain EXAM: CHEST - 2 VIEW COMPARISON:  10/11/2022 FINDINGS: Cardiac shadow is stable. Lungs are well aerated bilaterally. No focal infiltrate or effusion is seen. No bony  abnormality is noted. IMPRESSION: No acute abnormality noted. Electronically Signed   By: Oneil Devonshire M.D.   On: 05/14/2023 21:25    Assessment & Plan:   Problem List Items Addressed This Visit     Anxiety   Cont on Xanax  prn  Potential benefits of a long term benzodiazepines  use as well as potential risks  and complications were explained to the patient and were aknowledged. Not to take w/Tramadol       Relevant Orders   Comprehensive metabolic panel with GFR   CBC with Differential/Platelet   Atherosclerosis of aorta (HCC)   On Simvastatin       DM2 (diabetes mellitus, type 2) (HCC) - Primary   C/o problems w/Glimepiride . Started having dizziness when started taking them a few weeks ago. Stopped taking them and then started back this week, only two days with no dizziness D/c Glimepiride  Will try to manage DM2 w/diet. If high sugars - restart Amaryl  ( Glimepiride ) 2 mg 1/2 tablet daily      Relevant Medications   glimepiride  (AMARYL ) 2 MG tablet   Other Relevant Orders   Comprehensive metabolic panel with GFR   CBC with Differential/Platelet   Hemoglobin A1c   Essential hypertension   Cont on Isosorbide , Amlodipine  Cont on metoprolol  succinate 75 mg in the morning and metoprolol  succinate 75 mg in the evening.  Continue to monitor HR/symptoms. She is high risk for recurrent atrial fibrillation given high incidence of atrial ectopy.  Continue warfarin.       Relevant Orders   Comprehensive metabolic panel with GFR   CBC with Differential/Platelet   Hemoglobin A1c      Meds ordered this encounter  Medications   glimepiride  (AMARYL ) 2 MG tablet    Sig: Take 0.5 tablets (1 mg total) by mouth daily with breakfast.      Follow-up: No follow-ups on file.  Marolyn Noel, MD

## 2023-12-17 NOTE — Assessment & Plan Note (Signed)
 On Simvastatin

## 2023-12-17 NOTE — Assessment & Plan Note (Signed)
Cont on Xanax prn  Potential benefits of a long term benzodiazepines  use as well as potential risks  and complications were explained to the patient and were aknowledged. Not to take w/Tramadol 

## 2023-12-17 NOTE — Patient Instructions (Signed)
 If high sugars - restart Amaryl  ( Glimepiride ) 2 mg 1/2 tablet daily

## 2023-12-21 ENCOUNTER — Telehealth: Payer: Self-pay

## 2023-12-21 NOTE — Patient Instructions (Signed)
 [name] - I am sorry I was unable to reach you today for our scheduled appointment. I work with Plotnikov, Karlynn GAILS, MD and am calling to support your healthcare needs. Please contact me at [phone] at your earliest convenience. I look forward to speaking with you soon.   Thank you,   Heddy Shutter, RN, MSN, BSN, CCM [place]  [facility], Population Health Case Manager Phone: [phone]

## 2023-12-23 ENCOUNTER — Other Ambulatory Visit: Payer: Self-pay | Admitting: Internal Medicine

## 2023-12-23 ENCOUNTER — Telehealth: Payer: Self-pay

## 2023-12-23 ENCOUNTER — Ambulatory Visit: Payer: Self-pay | Admitting: Internal Medicine

## 2023-12-23 MED ORDER — GLIMEPIRIDE 2 MG PO TABS: 1.0000 mg | ORAL_TABLET | Freq: Two times a day (BID) | ORAL | Status: DC

## 2023-12-23 NOTE — Patient Instructions (Signed)
 Visit Information   We have been unable to make contact with the patient x 3 attempts. Closing from Complex Care Management..   Please call the Suicide and Crisis Lifeline: 988 call the [place]  National Suicide Prevention Lifeline: 254-322-7428 or TTY: 360-547-7277 TTY 781-401-8812) to talk to a trained counselor call 1-800-273-TALK (toll free, 24 hour hotline) if you are experiencing a Mental Health or Behavioral Health Crisis or need someone to talk to.  Heddy Shutter, RN, MSN, BSN, CCM [place]  [facility] Population Health Case Manager Phone: [phone]

## 2023-12-24 ENCOUNTER — Telehealth: Payer: Self-pay | Admitting: Radiology

## 2023-12-24 NOTE — Telephone Encounter (Signed)
 Copied from CRM #[id number]. Topic: Clinical - Lab/Test Results >> Dec 24, 2023 10:59 AM Rea ORN wrote: Reason for CRM: Pt called back and was advised of lab results. Pt would like more clarification on how she should be taking her Glimepiride  and at what time of the day she should be taking it. Please call her cell phone back [phone]

## 2023-12-25 NOTE — Telephone Encounter (Signed)
 Spoke with patient and verified medication prescribed (Glimepiride ) and required dosage intake per PCP

## 2023-12-28 NOTE — Patient Outreach (Signed)
 Complex Care Management   Visit Note  12/28/2023  Name:  [name] MRN: [id number] DOB: Nov 20, 1934  Situation: RNCM received incoming return call from Patient. Patient scheduled for 01/04/24 at 2:30 pm-initial assessment.  Heddy Shutter, RN, MSN, BSN, CCM [place]  [facility], Population Health Case Manager Phone: [phone]

## 2023-12-30 ENCOUNTER — Other Ambulatory Visit: Payer: Self-pay | Admitting: Internal Medicine

## 2023-12-30 DIAGNOSIS — E559 Vitamin D deficiency, unspecified: Secondary | ICD-10-CM

## 2024-01-04 ENCOUNTER — Other Ambulatory Visit: Payer: Self-pay

## 2024-01-04 DIAGNOSIS — E119 Type 2 diabetes mellitus without complications: Secondary | ICD-10-CM

## 2024-01-04 NOTE — Patient Outreach (Signed)
 Complex Care Management   Visit Note  01/04/2024  Name:  [name] MRN: [id number] DOB: 1934/10/07  Situation: Referral received for Complex Care Management related to {Criteria:32550} I obtained verbal consent from Patient.  Visit completed with Patient  on the phone  Background:   Past Medical History:  Diagnosis Date   Anxiety state, unspecified    Bronchitis, not specified as acute or chronic    Diaphragmatic hernia without mention of obstruction or gangrene    Dyslipidemia    Essential hypertension    Irritable bowel syndrome    Mitral valve disorders(424.0)    a. 08/2010 Echo: EF >55%, mild MR, mod TR, trace AI.   Non-obstructive CAD    a. 07/2010 Lexiscan MV: no ischemia, EF 78%; b. 07/2010 Cath: LM nl, LAD50/60p, 40 apical, LCX nl, RCA dominant, 30/40p, 51m.   Osteoarthrosis, unspecified whether generalized or localized, unspecified site    Osteoporosis, unspecified    Other abnormal glucose    PAF (paroxysmal atrial fibrillation) (HCC)    a. CHA2DS2VASc = 4-->coumadin .   Phlebitis and thrombophlebitis of superficial vessels of lower extremities    PSVT (paroxysmal supraventricular tachycardia)    a. 05/2012 Holter: short bursts of PSVT noted-->Managed with toprol .   Unspecified venous (peripheral) insufficiency    Unspecified vitamin D  deficiency     Assessment: Patient Reported Symptoms:  Cognitive Cognitive Status: No symptoms reported      Neurological Neurological Review of Symptoms: No symptoms reported    HEENT HEENT Symptoms Reported: No symptoms reported      Cardiovascular Cardiovascular Symptoms Reported: No symptoms reported    Respiratory Respiratory Symptoms Reported: No symptoms reported    Endocrine Endocrine Symptoms Reported: No symptoms reported Is patient diabetic?: Yes Is patient checking blood sugars at home?: Yes List most recent blood sugar readings, include date and time of day: 140 this morning fasting; around 10 am. 1/2  glymeperide ysterday morning ate baked chicken and rice about 10 am. started feeling a little shaky reports down to 79 around 4pm. patient states she ate some candy. She ate food: cabbage, fish mashed potatoes about 4pm recheck 127 a little later that evening Endocrine Self-Management Outcome: 4 (good)  Gastrointestinal Gastrointestinal Symptoms Reported: No symptoms reported      Genitourinary Genitourinary Symptoms Reported: No symptoms reported    Integumentary Integumentary Symptoms Reported: No symptoms reported    Musculoskeletal Musculoskelatal Symptoms Reviewed: No symptoms reported   Falls in the past year?: No    Psychosocial Psychosocial Symptoms Reported: Anxiety - if selected complete GAD     Quality of Family Relationships: supportive Do you feel physically threatened by others?: No    01/04/2024    PHQ2-9 Depression Screening   Little interest or pleasure in doing things Not at all  Feeling down, depressed, or hopeless Not at all  PHQ-2 - Total Score 0  Trouble falling or staying asleep, or sleeping too much    Feeling tired or having little energy    Poor appetite or overeating     Feeling bad about yourself - or that you are a failure or have let yourself or your family down    Trouble concentrating on things, such as reading the newspaper or watching television    Moving or speaking so slowly that other people could have noticed.  Or the opposite - being so fidgety or restless that you have been moving around a lot more than usual    Thoughts that you would be better  off dead, or hurting yourself in some way    PHQ2-9 Total Score    If you checked off any problems, how difficult have these problems made it for you to do your work, take care of things at home, or get along with other people    Depression Interventions/Treatment      There were no vitals filed for this visit.  Medications Reviewed Today     Reviewed by Alisyn Lequire M, RN (Registered Nurse)  on 01/04/24 at 1535  Med List Status: <None>   Medication Order Taking? Sig Documenting Provider Last Dose Status Informant  ACCU-CHEK GUIDE TEST test strip [id number]  1 each by Other route 2 (two) times daily. [provider]  Active   Accu-Chek Softclix Lancets lancets [id number]  USE AS INSTRUCTED Plotnikov, Karlynn GAILS, MD  Active   acetaminophen  (TYLENOL ) 500 MG tablet [id number] Yes Take 500 mg by mouth every 6 (six) hours as needed. [provider]  Active Self, Pharmacy Records, Family Member  Alcohol Swabs (DROPSAFE ALCOHOL PREP) 70 % PADS [id number]  USE TO CLEAN INJECTION SITE TO CHECK BLOOD SUGARS DAILY Plotnikov, Aleksei V, MD  Active   ALPRAZolam  (XANAX ) 0.5 MG tablet [id number] Yes TAKE 1/2 TO 1 TABLET BY MOUTH THREE TIMES DAILY AS NEEDED FOR ANXIETY. DO NOT TAKE WITH TRAMADOL . THIS IS A 30 DAY SUPPLY Plotnikov, Aleksei V, MD  Active   amLODipine  (NORVASC ) 5 MG tablet [id number] Yes TAKE 1 TABLET(5 MG) BY MOUTH DAILY Plotnikov, Aleksei V, MD  Active   aspirin  81 MG tablet [id number] Yes Take 81 mg by mouth daily. [provider]  Active Self, Pharmacy Records, Family Member  Cholecalciferol (VITAMIN D3) 50 MCG (2000 UT) capsule [id number] Yes TAKE 1 CAPSULE BY MOUTH EVERY DAY Plotnikov, Aleksei V, MD  Active   diclofenac  Sodium (VOLTAREN ) 1 % GEL [id number]  Apply 2 g topically 4 (four) times daily.  Patient not taking: Reported on 01/04/2024   Jule Ronal LITTIE DEVONNA  Active Self, Pharmacy Records, Family Member  dicyclomine  (BENTYL ) 10 MG capsule [id number]  Take 1 capsule (10 mg total) by mouth 4 (four) times daily -  before meals and at bedtime.  Patient not taking: Reported on 01/04/2024   Alvia Corean LITTIE, FNP  Active   furosemide  (LASIX ) 20 MG tablet [id number] Yes TAKE 1 TABLET(20 MG) BY MOUTH DAILY AS NEEDED FOR SWELLING Plotnikov, Aleksei V, MD  Active   glimepiride  (AMARYL ) 2 MG tablet [id number] Yes Take 0.5 tablets (1 mg total) by mouth 2 (two) times  daily.  Patient taking differently: Take 0.5 tablets (1 mg total) by mouth 2 (two) times daily.   Plotnikov, Aleksei V, MD  Active   hydroxypropyl methylcellulose (ISOPTO TEARS) 2.5 % ophthalmic solution [id number] Yes Place 1 drop into both eyes 2 (two) times daily. [provider]  Active Self, Pharmacy Records, Family Member  hyoscyamine  (LEVSIN ) 0.125 MG tablet [id number] Yes TAKE 1 TABLET BY MOUTH EVERY 6 HOURS FOR UP TO 10 DAYS AS NEEDED FOR CRAMPING Plotnikov, Karlynn GAILS, MD  Active   isosorbide  mononitrate (IMDUR ) 30 MG 24 hr tablet [id number]  TAKE 1 TABLET BY MOUTH EVERY DAY Hilty, Vinie BROCKS, MD  Active   metoprolol  succinate (TOPROL -XL) 50 MG 24 hr tablet [id number] Yes TAKE 1 AND 1/2 TABLETS BY MOUTH TWICE DAILY Hilty, Vinie BROCKS, MD  Active   pantoprazole  (PROTONIX ) 40 MG tablet [id number]  TAKE 1 TABLET BY MOUTH EVERY DAY Hilty, Vinie BROCKS, MD  Active  simvastatin  (ZOCOR ) 20 MG tablet [id number] Yes TAKE 1 TABLET(20 MG) BY MOUTH AT BEDTIME Mona Vinie BROCKS, MD  Active   warfarin (COUMADIN ) 5 MG tablet [id number] Yes TAKE 1/2 TO 1 TABLET BY MOUTH DAILY AS DIRECTED BY COUMADIN  CLINIC Hilty, Vinie BROCKS, MD  Active   Med List Note Marisa Nathanel LOISE Bishop 05/15/23 2033): Koriana Stepien DIL [id number]          Recommendation:   Patient to check and record CBG's and BP/HR and notify provider with any questions or concerns.  Follow Up Plan:   Telephone follow up appointment date/time:  01/18/24 at 2:00 pm  Heddy Shutter, RN, MSN, BSN, CCM [place]  [facility], Population Health Case Manager Phone: [phone]

## 2024-01-05 NOTE — Patient Instructions (Signed)
 Visit Information  Thank you for taking time to visit with me today. Please don't hesitate to contact me if I can be of assistance to you before our next scheduled appointment.  Your next care management appointment is by telephone on 01/18/24 at 2:00 pm  Please call the care guide team at [phone] if you need to cancel, schedule, or reschedule an appointment.   Please call the Suicide and Crisis Lifeline: 988 call the [place]  National Suicide Prevention Lifeline: 785 072 7846 or TTY: (513)632-3921 TTY (484) 084-2977) to talk to a trained counselor if you are experiencing a Mental Health or Behavioral Health Crisis or need someone to talk to.   Heddy Shutter, RN, MSN, BSN, CCM [place]  [facility], Population Health Case Manager Phone: [phone]

## 2024-01-08 ENCOUNTER — Telehealth: Payer: Self-pay | Admitting: *Deleted

## 2024-01-08 NOTE — Progress Notes (Signed)
 Care Guide Pharmacy Note  01/08/2024 Name: [name] MRN: [id number] DOB: 1934/06/20  Referred By: Garald Karlynn GAILS, MD Reason for referral: Complex Care Management (Outreach to schedule referral with pharmacist )   [name] is a 89 y.o. year old female who is a primary care patient of Plotnikov, Aleksei V, MD.  Clancy CHRISTELLA Shams was referred to the pharmacist for assistance related to: DMII  Successful contact was made with the patient to discuss pharmacy services including being ready for the pharmacist to call at least 5 minutes before the scheduled appointment time and to have medication bottles and any blood pressure readings ready for review. The patient agreed to meet with the pharmacist via telephone visit on (01/18/2024  Thedford Franks, CMA [place]  [facility], [place] [facility] Guide Direct Dial: [phone]  Fax: [phone] Website: [email or web address]

## 2024-01-13 DIAGNOSIS — E119 Type 2 diabetes mellitus without complications: Secondary | ICD-10-CM | POA: Diagnosis not present

## 2024-01-13 DIAGNOSIS — H02834 Dermatochalasis of left upper eyelid: Secondary | ICD-10-CM | POA: Diagnosis not present

## 2024-01-13 DIAGNOSIS — H04123 Dry eye syndrome of bilateral lacrimal glands: Secondary | ICD-10-CM | POA: Diagnosis not present

## 2024-01-13 DIAGNOSIS — H02831 Dermatochalasis of right upper eyelid: Secondary | ICD-10-CM | POA: Diagnosis not present

## 2024-01-13 DIAGNOSIS — H25813 Combined forms of age-related cataract, bilateral: Secondary | ICD-10-CM | POA: Diagnosis not present

## 2024-01-15 ENCOUNTER — Ambulatory Visit: Attending: Internal Medicine | Admitting: Pharmacist

## 2024-01-15 DIAGNOSIS — I480 Paroxysmal atrial fibrillation: Secondary | ICD-10-CM | POA: Diagnosis not present

## 2024-01-15 DIAGNOSIS — Z7901 Long term (current) use of anticoagulants: Secondary | ICD-10-CM | POA: Diagnosis not present

## 2024-01-15 LAB — POCT INR: INR: 2.2 (ref 2.0–3.0)

## 2024-01-15 NOTE — Progress Notes (Signed)
 Description   Inr 2.2:Continue taking Warfarin 1 tablet daily.  Repeat INR in 6 weeks.  Call [phone] with an warfarin related questions. *Consistent amount of greens every week*

## 2024-01-15 NOTE — Patient Instructions (Signed)
 Description   Inr 2.2:Continue taking Warfarin 1 tablet daily.  Repeat INR in 6 weeks.  Call [phone] with an warfarin related questions. *Consistent amount of greens every week*

## 2024-01-18 ENCOUNTER — Telehealth: Payer: Self-pay

## 2024-01-20 ENCOUNTER — Other Ambulatory Visit (INDEPENDENT_AMBULATORY_CARE_PROVIDER_SITE_OTHER): Admitting: Pharmacist

## 2024-01-20 DIAGNOSIS — E119 Type 2 diabetes mellitus without complications: Secondary | ICD-10-CM

## 2024-01-20 MED ORDER — GLIMEPIRIDE 1 MG PO TABS: 1.0000 mg | ORAL_TABLET | Freq: Every day | ORAL | 1 refills | Status: AC

## 2024-01-20 NOTE — Patient Instructions (Signed)
 It was a pleasure speaking with you today!  Take glimepiride  1 mg in the morning as needed for BG >180.  How to treat low blood sugar:  - For blood sugar less than 70: Treat with 4 ounces of juice or regular soda, or with 3 to 4 glucose tablets.  - Re-check blood sugar in 15 minutes. ?  -If blood sugar is still less than 70 on re-check, treat again and re-check in 15 minutes - Once blood sugar is back above 70, eat a balanced meal or snack to avoid blood sugar dropping again   Feel free to call with any questions or concerns!  Darrelyn Drum, PharmD, BCPS, CPP Clinical Pharmacist Practitioner [place] [facility] [phone]

## 2024-01-20 NOTE — Progress Notes (Addendum)
 01/20/2024 Name: [name] MRN: [id number] DOB: Aug 26, 1934  Chief Complaint  Patient presents with   Diabetes   Medication Management    [name] is a 89 y.o. year old female who presented for a telephone visit.   They were referred to the pharmacist by the [name] Complex Case Management program for assistance in managing diabetes.   Subjective:  Care Team: Primary Care Provider: Garald Karlynn GAILS, MD ; Next Scheduled Visit: 03/17/24   Medication Access/Adherence  Current Pharmacy:  [id number] [place], [place] - [address] AT [address] [place] [address] Phone: [phone] Fax: [phone]  [facility] - [place], [place] - [address] [place] [address] Phone: [phone] Fax: [phone]   Patient reports affordability concerns with their medications: No  Patient reports access/transportation concerns to their pharmacy: No  Patient reports adherence concerns with their medications: Yes   Diabetes:  Current medications: glimepiride  2 mg 1/2 tablet taking once daily as needed Medications tried in the past: Prandin  (hypoglycemia), metformin  Current glucose readings:  Fasting 134, 156, 129, 144 Checked one day in the afternoon after taking half glimepiride  in the AM and it was 70 PM BG 130-140s   Patient reports hypoglycemic s/sx including dizziness occasionally Patient denies hyperglycemic symptoms including polyuria, polydipsia, polyphagia, nocturia, neuropathy, blurred vision.  Current meal patterns:  No soda except ginger ale occasionally Gets Meals of Wheels daily for dinner   Macrovascular and Microvascular Risk Reduction:  Statin? yes (simvastatin ); ACEi/ARB? Needs MACR checked Last urinary albumin/creatinine ratio:   Last eye exam:  Lab Results  Component Value Date   HMDIABEYEEXA No Retinopathy 09/04/2022   Last foot exam: No foot exam  found Tobacco Use:  Tobacco Use: Low Risk  (12/17/2023)   Patient History    Smoking Tobacco Use: Never    Smokeless Tobacco Use: Never    Passive Exposure: Never     Objective:  Lab Results  Component Value Date   HGBA1C 8.0 (H) 12/17/2023    Lab Results  Component Value Date   CREATININE 1.06 12/17/2023   BUN 14 12/17/2023   NA 136 12/17/2023   K 3.9 12/17/2023   CL 99 12/17/2023   CO2 32 12/17/2023    Lab Results  Component Value Date   CHOL 163 11/27/2022   HDL 57.30 11/27/2022   LDLCALC 76 11/27/2022   LDLDIRECT 105.5 11/26/2010   TRIG 149.0 11/27/2022   CHOLHDL 3 11/27/2022    Medications Reviewed Today     Reviewed by Merceda Lela SAUNDERS, RPH (Pharmacist) on 01/20/24 at 1033  Med List Status: <None>   Medication Order Taking? Sig Documenting Provider Last Dose Status Informant  ACCU-CHEK GUIDE TEST test strip [id number]  1 each by Other route 2 (two) times daily. [provider]  Active   Accu-Chek Softclix Lancets lancets [id number]  USE AS INSTRUCTED Plotnikov, Karlynn GAILS, MD  Active   acetaminophen  (TYLENOL ) 500 MG tablet [id number]  Take 500 mg by mouth every 6 (six) hours as needed. [provider]  Active Self, Pharmacy Records, Family Member  Alcohol Swabs (DROPSAFE ALCOHOL PREP) 70 % PADS [id number]  USE TO CLEAN INJECTION SITE TO CHECK BLOOD SUGARS DAILY Plotnikov, Aleksei V, MD  Active   ALPRAZolam  (XANAX ) 0.5 MG tablet [id number]  TAKE 1/2 TO 1 TABLET BY MOUTH THREE TIMES DAILY AS NEEDED FOR ANXIETY. DO NOT TAKE WITH TRAMADOL . THIS IS A 30 DAY  SUPPLY Plotnikov, Karlynn GAILS, MD  Active   amLODipine  (NORVASC ) 5 MG tablet [id number] Yes TAKE 1 TABLET(5 MG) BY MOUTH DAILY Plotnikov, Aleksei V, MD  Active   aspirin  81 MG tablet [id number] Yes Take 81 mg by mouth daily. [provider]  Active Self, Pharmacy Records, Family Member  Cholecalciferol (VITAMIN D3) 50 MCG (2000 UT) capsule [id number] Yes TAKE 1 CAPSULE BY MOUTH EVERY DAY Plotnikov,  Aleksei V, MD  Active   diclofenac  Sodium (VOLTAREN ) 1 % GEL [id number]  Apply 2 g topically 4 (four) times daily.  Patient not taking: Reported on 01/04/2024   [name]  Active Self, Pharmacy Records, Family Member  dicyclomine  (BENTYL ) 10 MG capsule [id number]  Take 1 capsule (10 mg total) by mouth 4 (four) times daily -  before meals and at bedtime.  Patient not taking: Reported on 01/04/2024   Kelly Corean LITTIE, FNP  Active   furosemide  (LASIX ) 20 MG tablet [id number] Yes TAKE 1 TABLET(20 MG) BY MOUTH DAILY AS NEEDED FOR SWELLING Plotnikov, Aleksei V, MD  Active   glimepiride  (AMARYL ) 2 MG tablet [id number] Yes Take 0.5 tablets (1 mg total) by mouth 2 (two) times daily.  Patient taking differently: Take 0.5 tablets (1 mg total) by mouth 2 (two) times daily.   Plotnikov, Aleksei V, MD  Active   hydroxypropyl methylcellulose (ISOPTO TEARS) 2.5 % ophthalmic solution [id number]  Place 1 drop into both eyes 2 (two) times daily. [provider]  Active Self, Pharmacy Records, Family Member  hyoscyamine  (LEVSIN ) 0.125 MG tablet [id number]  TAKE 1 TABLET BY MOUTH EVERY 6 HOURS FOR UP TO 10 DAYS AS NEEDED FOR CRAMPING Plotnikov, Aleksei V, MD  Active   isosorbide  mononitrate (IMDUR ) 30 MG 24 hr tablet [id number] Yes TAKE 1 TABLET BY MOUTH EVERY DAY Hilty, Vinie BROCKS, MD  Active   metoprolol  succinate (TOPROL -XL) 50 MG 24 hr tablet [id number] Yes TAKE 1 AND 1/2 TABLETS BY MOUTH TWICE DAILY Mona Vinie BROCKS, MD  Active   pantoprazole  (PROTONIX ) 40 MG tablet [id number] Yes TAKE 1 TABLET BY MOUTH EVERY DAY Hilty, Vinie BROCKS, MD  Active   simvastatin  (ZOCOR ) 20 MG tablet [id number] Yes TAKE 1 TABLET(20 MG) BY MOUTH AT BEDTIME Mona Vinie BROCKS, MD  Active   warfarin (COUMADIN ) 5 MG tablet [id number] Yes TAKE 1/2 TO 1 TABLET BY MOUTH DAILY AS DIRECTED BY COUMADIN  CLINIC Hilty, Vinie BROCKS, MD  Active   Med List Note Marisa Nathanel LOISE Bishop 05/15/23 2033): [name] DIL [phone]             Assessment/Plan:   Diabetes: - Currently controlled; goal A1c <7%. Cardiorenal risk reduction is opportunities for improvement.. Blood pressure is at goal <130/80. LDL is not at goal.  - Reviewed long term cardiovascular and renal outcomes of uncontrolled blood sugar., Reviewed goal A1c, goal fasting, and goal 2 hour post prandial glucose. Recommended to check glucose daily, Reviewed dietary modifications including balanced meals with protein and fiber, limiting portion sizes of starchy foods, limiting sugar sweetened beverages., and Reviewed signs and symptoms of hypoglycemia. Reviewed how to treat hypoglycemia. - Recommend to take glimepiride  2 mg 1/2 tablet in the morning as needed for BG  - Concerned more so regarding hypoglycemia considering pt age. Sulfonylyrea is not ideal given risk of low BG. Did not tolerate metformin in the past. SGLT1 inhibitor and GLP-1 will be cost prohibitive.  Follow Up Plan: A1c in December at PCP f/u  Darrelyn Drum, PharmD, BCPS, CPP  Clinical Pharmacist Practitioner [place] [facility] [phone]   Medical screening examination/treatment/procedure(s) were performed by non-physician practitioner and as supervising physician I was immediately available for consultation/collaboration.  I agree with above. Karlynn Noel, MD

## 2024-02-02 ENCOUNTER — Other Ambulatory Visit: Payer: Self-pay

## 2024-02-02 NOTE — Patient Outreach (Signed)
 Complex Care Management   Visit Note  02/02/2024  Name:  [name] MRN: [id number] DOB: 07/27/1934  Situation: Referral received for Complex Care Management related to DM I obtained verbal consent from Patient.  Visit completed with Patient  on the phone  Background:   Past Medical History:  Diagnosis Date   Anxiety state, unspecified    Bronchitis, not specified as acute or chronic    Diaphragmatic hernia without mention of obstruction or gangrene    Dyslipidemia    Essential hypertension    Irritable bowel syndrome    Mitral valve disorders(424.0)    a. 08/2010 Echo: EF >55%, mild MR, mod TR, trace AI.   Non-obstructive CAD    a. 07/2010 Lexiscan MV: no ischemia, EF 78%; b. 07/2010 Cath: LM nl, LAD50/60p, 40 apical, LCX nl, RCA dominant, 30/40p, 63m.   Osteoarthrosis, unspecified whether generalized or localized, unspecified site    Osteoporosis, unspecified    Other abnormal glucose    PAF (paroxysmal atrial fibrillation) (HCC)    a. CHA2DS2VASc = 4-->coumadin .   Phlebitis and thrombophlebitis of superficial vessels of lower extremities    PSVT (paroxysmal supraventricular tachycardia)    a. 05/2012 Holter: short bursts of PSVT noted-->Managed with toprol .   Unspecified venous (peripheral) insufficiency    Unspecified vitamin D  deficiency     Assessment: Patient Reported Symptoms:  Cognitive Cognitive Status: No symptoms reported      Neurological Neurological Review of Symptoms: No symptoms reported    HEENT HEENT Symptoms Reported: No symptoms reported      Cardiovascular Cardiovascular Symptoms Reported: No symptoms reported Does patient have uncontrolled Hypertension?: No    Respiratory Respiratory Symptoms Reported: No symptoms reported    Endocrine Endocrine Symptoms Reported: No symptoms reported Is patient diabetic?: Yes Is patient checking blood sugars at home?: Yes List most recent blood sugar readings, include date and time of day: Patient reports  BS 136 fasting. Patient denies any low blood sugar medication change    Gastrointestinal Gastrointestinal Symptoms Reported: No symptoms reported      Genitourinary Genitourinary Symptoms Reported: No symptoms reported    Integumentary Integumentary Symptoms Reported: No symptoms reported    Musculoskeletal Musculoskelatal Symptoms Reviewed: No symptoms reported Additional Musculoskeletal Details: patient reports, I can walk pretty good, but I have to take it easy. reports walks with a walking stick when she goes out and uses a motorized chairs if store has it. Patient denies any difficulty walking in the house or out in the yard. Musculoskeletal Management Strategies: Medical device, Activity      Psychosocial Psychosocial Symptoms Reported: No symptoms reported          There were no vitals filed for this visit.  Medications Reviewed Today     Reviewed by Marielle Mantione M, RN (Registered Nurse) on 02/02/24 at 1359  Med List Status: <None>   Medication Order Taking? Sig Documenting Provider Last Dose Status Informant  ACCU-CHEK GUIDE TEST test strip [id number]  1 each by Other route 2 (two) times daily. [provider]  Active   Accu-Chek Softclix Lancets lancets [id number]  USE AS INSTRUCTED Plotnikov, Karlynn GAILS, MD  Active   acetaminophen  (TYLENOL ) 500 MG tablet [id number]  Take 500 mg by mouth every 6 (six) hours as needed. [provider]  Active Self, Pharmacy Records, Family Member  Alcohol Swabs (DROPSAFE ALCOHOL PREP) 70 % PADS [id number]  USE TO CLEAN INJECTION SITE TO CHECK BLOOD SUGARS DAILY Plotnikov, Aleksei V, MD  Active  ALPRAZolam  (XANAX ) 0.5 MG tablet [id number] Yes TAKE 1/2 TO 1 TABLET BY MOUTH THREE TIMES DAILY AS NEEDED FOR ANXIETY. DO NOT TAKE WITH TRAMADOL . THIS IS A 30 DAY SUPPLY Plotnikov, Karlynn GAILS, MD  Active   amLODipine  (NORVASC ) 5 MG tablet [id number] Yes TAKE 1 TABLET(5 MG) BY MOUTH DAILY Plotnikov, Aleksei V, MD  Active   aspirin  81 MG  tablet [id number] Yes Take 81 mg by mouth daily. [provider]  Active Self, Pharmacy Records, Family Member  Cholecalciferol (VITAMIN D3) 50 MCG (2000 UT) capsule [id number] Yes TAKE 1 CAPSULE BY MOUTH EVERY DAY Plotnikov, Aleksei V, MD  Active   diclofenac  Sodium (VOLTAREN ) 1 % GEL [id number]  Apply 2 g topically 4 (four) times daily.  Patient not taking: Reported on 02/02/2024   Jule Ronal CROME, PA-C  Active Self, Pharmacy Records, Family Member  dicyclomine  (BENTYL ) 10 MG capsule [id number]  Take 1 capsule (10 mg total) by mouth 4 (four) times daily -  before meals and at bedtime.  Patient not taking: Reported on 02/02/2024   Alvia Corean CROME, FNP  Active   furosemide  (LASIX ) 20 MG tablet [id number] Yes TAKE 1 TABLET(20 MG) BY MOUTH DAILY AS NEEDED FOR SWELLING Plotnikov, Aleksei V, MD  Active   glimepiride  (AMARYL ) 1 MG tablet [id number] Yes Take 1 tablet (1 mg total) by mouth daily with breakfast. As needed for blood sugar above 180. Plotnikov, Aleksei V, MD  Active   hydroxypropyl methylcellulose (ISOPTO TEARS) 2.5 % ophthalmic solution [id number] Yes Place 1 drop into both eyes 2 (two) times daily. [provider]  Active Self, Pharmacy Records, Family Member  hyoscyamine  (LEVSIN ) 0.125 MG tablet [id number] Yes TAKE 1 TABLET BY MOUTH EVERY 6 HOURS FOR UP TO 10 DAYS AS NEEDED FOR CRAMPING Plotnikov, Karlynn GAILS, MD  Active   isosorbide  mononitrate (IMDUR ) 30 MG 24 hr tablet [id number] Yes TAKE 1 TABLET BY MOUTH EVERY DAY Hilty, Vinie BROCKS, MD  Active   metoprolol  succinate (TOPROL -XL) 50 MG 24 hr tablet [id number] Yes TAKE 1 AND 1/2 TABLETS BY MOUTH TWICE DAILY Mona Vinie BROCKS, MD  Active   pantoprazole  (PROTONIX ) 40 MG tablet [id number] Yes TAKE 1 TABLET BY MOUTH EVERY DAY Mona Vinie BROCKS, MD  Active   simvastatin  (ZOCOR ) 20 MG tablet [id number] Yes TAKE 1 TABLET(20 MG) BY MOUTH AT BEDTIME Mona Vinie BROCKS, MD  Active   warfarin (COUMADIN ) 5 MG tablet [id number] Yes TAKE 1/2 TO 1  TABLET BY MOUTH DAILY AS DIRECTED BY COUMADIN  CLINIC Hilty, Vinie BROCKS, MD  Active   Med List Note Marisa Nathanel LOISE Bishop 05/15/23 2033): Blayke Pinera DIL [phone]          Recommendation:   Continue Current Plan of Care  Follow Up Plan:   Telephone follow up appointment date/time:  03/02/24  Heddy Shutter, RN, MSN, BSN, CCM [place]  [facility], Population Health Case Manager Phone: [phone]

## 2024-02-02 NOTE — Patient Instructions (Signed)
 Visit Information  Thank you for taking time to visit with me today. Please don't hesitate to contact me if I can be of assistance to you before our next scheduled appointment.  Your next care management appointment is by telephone on 03/02/24 at 11:30 am  Please call the care guide team at [phone] if you need to cancel, schedule, or reschedule an appointment.   Please call the Suicide and Crisis Lifeline: 988 call the [place]  National Suicide Prevention Lifeline: 931 510 8328 or TTY: 913 136 8731 TTY [phone]) to talk to a trained counselor if you are experiencing a Mental Health or Behavioral Health Crisis or need someone to talk to.  Heddy Shutter, RN, MSN, BSN, CCM [place]  [facility], Population Health Case Manager Phone: [phone]    Diabetes: Healthy Eating When you have diabetes, also called diabetes mellitus, it's important to have healthy eating habits. Your blood sugar (glucose) levels are greatly affected by what you eat and drink. You need to eat healthy foods in the right amounts, at about the same times each day. Doing this can help you: Manage your blood sugar. Lower your risk of heart disease. Improve your blood pressure. Reach or stay at a healthy weight. What can affect my meal plan? Every person with diabetes is different. And each person has different needs for a meal plan. Your health care provider may suggest that you work with an expert in healthy eating called a dietitian. They can help you make a meal plan that's best for you. How do carbohydrates affect me? Carbohydrates, also called carbs, affect your blood sugar level more than any other type of food. Eating carbs raises the amount of sugar in your blood. It's important to know how many carbs you can safely have in each meal. This is different for every person. Your dietitian can help you calculate how many carbs you should have at each meal and for each snack. How does alcohol  affect me? Alcohol can cause a decrease in blood sugar (hypoglycemia), especially if you use insulin or take certain diabetes medicines by mouth. Hypoglycemia can be a life-threatening condition. Symptoms of hypoglycemia are similar to those of having too much alcohol. They include confusion, being sleepy, and feeling dizzy. Do not drink alcohol if: Your provider tells you not to drink. You're pregnant, may be pregnant, or plan to become pregnant. What are tips for following this plan? Reading food labels Start by checking the serving size on the Nutrition Facts label of packaged foods and drinks. The number of calories and the amount of carbs, fats, and other nutrients listed on the label are based on one serving of the item. Many items contain more than one serving per package. Check the total grams (g) of carbs in one serving. Check the number of grams of saturated fats and trans fats in one serving. Choose foods that have a low amount or none of these fats. Check the number of milligrams (mg) of salt (sodium) in one serving. Most people should limit their total sodium intake to less than 2,300 mg per day. Always check the nutrition information of foods labeled as low-fat or nonfat. These foods may be higher in added sugar or refined carbs and should be avoided. Talk to your dietitian to identify your daily goals for nutrients listed on the label. Shopping Avoid buying canned, pre-made, or processed foods. These foods tend to be high in fat, sodium, and added sugar. Shop around the outside edge of the grocery store. This  is where you'll most often find fresh fruits and vegetables, bulk grains, fresh meats, and fresh dairy products. Cooking Use low-heat cooking methods, such as baking, instead of high-heat methods like deep frying. Cook using healthy oils, such as olive, canola, or sunflower oil. Avoid cooking with butter, cream, or high-fat meats. Meal planning  Eat meals and snacks  regularly. Try to eat them at the same times every day. Avoid going too long without eating. Eat foods that are high in fiber, such as fresh fruits, vegetables, beans, and whole grains. Eat 4-6 oz (112-168 g) of lean protein each day, such as lean meat, chicken, fish, eggs, or tofu. One ounce (oz) (28 g) of lean protein is equal to: 1 oz (28 g) of meat, chicken, or fish. 1 egg.  cup (62 g) of tofu. Eat some foods each day that contain healthy fats, such as avocado, nuts, seeds, and fish. What foods should I eat? Fruits Berries. Apples. Oranges. Peaches. Apricots. Plums. Grapes. Mangoes. Papayas. Pomegranates. Kiwi. Cherries. Vegetables Leafy greens, including lettuce, spinach, kale, chard, collard greens, mustard greens, and cabbage. Beets. Cauliflower. Broccoli. Carrots. Green beans. Tomatoes. Peppers. Onions. Cucumbers. Brussels sprouts. Grains Whole grains, such as whole-wheat or whole-grain bread, crackers, tortillas, cereal, and pasta. Unsweetened oatmeal. Quinoa. Brown or wild rice. Meats and other proteins Seafood. Poultry without skin. Lean cuts of poultry and beef. Tofu. Nuts. Seeds. Dairy Low-fat or fat-free dairy products such as milk, yogurt, and cheese. The items listed above may not be all the foods and drinks you can have. Talk with a dietitian to learn more. What foods should I avoid? Fruits Fruits canned with syrup. Vegetables Canned vegetables. Frozen vegetables with butter or cream sauce. Grains Refined white flour and flour products such as bread, pasta, snack foods, and cereals. Avoid all processed foods. Meats and other proteins Fatty cuts of meat. Poultry with skin. Breaded or fried meats. Processed meat. Avoid saturated fats. Dairy Full-fat yogurt, cheese, or milk. Beverages Sweetened drinks, such as soda or iced tea. The items listed above may not be all the foods and drinks you should avoid. Talk with a dietitian to learn more. Where to find more  information: To learn more, go to: Academy of Nutrition and Dietetics at DeathPrevention.it. Click Search and type diabetes. Find the link you need. Centers for Disease Control and Prevention at TonerPromos.no. Click Search and type diabetes. Find the link you need. American Diabetes Association: diabetes.org/food-nutrition [employer] of Diabetes and Digestive and Kidney Diseases: StageSync.si This information is not intended to replace advice given to you by your health care provider. Make sure you discuss any questions you have with your health care provider. Document Revised: 03/19/2023 Document Reviewed: 03/19/2023 Elsevier Patient Education  2025 [employer].   Advance Directive  Advance directives are legal papers that state your wishes about health care decisions. They let your wishes be known to family, friends, and health care providers if you become unable to speak for yourself.  You should write these papers out over time rather than all at once. They can be changed and updated at any time. The types of advance directives include: Medical power of attorney (POA). Living wills. Do not resuscitate (DNR) or do not attempt resuscitation (DNAR) orders. What are a health care proxy and medical POA? A health care proxy is also called a health care agent. It's a person you choose to make medical decisions for you when you can't make them for yourself. In most cases, a proxy is  a trusted friend or family member. A medical POA is legal paperwork that names your proxy. It may need to be: Signed. Notarized. Dated. Copied. Witnessed. Added to your medical record. You may also want to choose someone to handle your money if you can't do so. This is called a durable POA for finances. It's separate from a medical POA. You may choose your health care proxy or someone else to act as your agent in money matters. If you don't have a proxy, or if the proxy may not be acting in your best  interest, a court may choose a guardian to act on your behalf. What is a living will? A living will is legal paperwork that states your wishes about medical care. Providers should keep a copy of it in your medical record. You may want to give a copy to family members or friends. You can also keep a card in your wallet to let loved ones know you have a living will and where they can find it. A living will may be used if: You're very sick with something that will end your life. You become disabled. You can't make decisions or speak for yourself. Your living will should include whether: To use or not use life support equipment. This may include machines to filter your blood or to help you breathe. You want a DNR or DNAR order. This tells providers not to use CPR if your heart or breathing stops. To use or not use tube feeding. You want to be given foods and fluids. You want a type of comfort care called palliative care. This may be given when the goal for treatment becomes comfort rather than a cure. You want to donate your organs and tissues. A living will doesn't say what to do with your money and property if you pass away. What is a DNR or DNAR? A DNR or DNAR order is a request not to have CPR. If you don't have one of these orders, a provider will try to help you if your heart stops or you stop breathing.  If you plan to have surgery, talk with your provider about your DNR or DNAR order. What happens if I don't have an advance directive? Each state has its own laws about advance directives. Some states assign family decision makers to act on your behalf if you don't have an advance directive.  Check with your provider, attorney, or state representative about the laws in your state. Where to find more information Each state has its own laws about advance directives. You can look up these laws at: https://rodriguez-phillips.com/ This information is not intended to replace advice given to you by your health care  provider. Make sure you discuss any questions you have with your health care provider. Document Revised: 08/18/2022 Document Reviewed: 08/18/2022 Elsevier Patient Education  2024 [employer].

## 2024-02-05 ENCOUNTER — Telehealth: Payer: Self-pay

## 2024-02-05 ENCOUNTER — Ambulatory Visit: Payer: Self-pay

## 2024-02-05 NOTE — Patient Outreach (Signed)
 Complex Care Management   Visit Note  02/05/2024  Name:  [name] MRN: [id number] DOB: 01/08/1935  Situation: RNCM received voice message from patient expressing concerns about her blood pressure and blood sugar. RNCM returned call to patient. However no answer and Unable to leave message.   RNCM reviewed chart in preparation to reach out to patient again and noted. Patient has contacted provider office and spoken with triage nurse.  Recommendation:   No recommendation  Follow Up Plan:   RNCM will continue to follow  Keaghan Staton, RN, MSN, BSN, CCM [place]  [facility], Population Health Case Manager Phone: [phone]

## 2024-02-05 NOTE — Telephone Encounter (Signed)
  FYI Only or Action Required?: Action required by provider: update on patient condition.  Patient was last seen in primary care on 12/17/2023 by Plotnikov, Karlynn GAILS, MD.  Called Nurse Triage reporting Blood Sugar Problem and Hypertension. Fluctuations in blood sugar and blood pressure  Symptoms began several days ago.  Interventions attempted: Prescription medications: taking all scheduled meds including glimiperide as needed.  Symptoms are: gradually improving.  Triage Disposition: See PCP Within 2 Weeks, Home Care  Patient/caregiver understands and will follow disposition?:   Copied from CRM [id number]. Topic: Clinical - Red Word Triage >> Feb 05, 2024  1:13 PM Jasmin G wrote: [facility] that prompted transfer to Nurse Triage: Pt states that her blood sugar has been fluctuating the last couple of days, highest being close to 180. Reason for Disposition  Blood glucose 70-240 mg/dL (3.9 -86.6 mmol/L)  [8] Systolic BP >= 130 OR Diastolic >= 80 AND [2] taking BP medications  Answer Assessment - Initial Assessment Questions Reports fluctuations in blood sugar as high as 180 one time in the past week   1. BLOOD GLUCOSE: What is your blood glucose level?      137 this morning  3. USUAL RANGE: What is your glucose level usually? (e.g., usual fasting morning value, usual evening value)     80's, normally checks in the morning and at night and if she feels different during the day  5. TYPE 1 or 2:  Do you know what type of diabetes you have?  (e.g., Type 1, Type 2, Gestational; doesn't know)      Type 2 6. INSULIN: Do you take insulin? What type of insulin(s) do you use? What is the mode of delivery? (syringe, pen; injection or pump)?      Does not use 7. DIABETES PILLS: Do you take any pills for your diabetes? If Yes, ask: Have you missed taking any pills recently?     Glimeperide 1 mg, no missed dosages 8. OTHER SYMPTOMS: Do you have any symptoms? (e.g., fever, frequent  urination, difficulty breathing, dizziness, weakness, vomiting)     Dizziness that went away after eating. History of dizziness on glimeperide  Answer Assessment - Initial Assessment Questions 1. BLOOD PRESSURE: What is your blood pressure? Did you take at least two measurements 5 minutes apart?     Patient states that her blood pressure was 137 this morning.  States that she started to feel a little nervous and that it was 180 2. ONSET: When did you take your blood pressure?     In the morning and night and as needed 3. HOW: How did you take your blood pressure? (e.g., automatic home BP monitor, visiting nurse)     Automatic cuff 4. HISTORY: Do you have a history of high blood pressure?     Yes, history of htn 5. MEDICINES: Are you taking any medicines for blood pressure? Have you missed any doses recently?     No missed doses 6. OTHER SYMPTOMS: Do you have any symptoms? (e.g., blurred vision, chest pain, difficulty breathing, headache, weakness)     No problems breathing, but not breathing like normal  Protocols used: Diabetes - High Blood Sugar-A-AH, Blood Pressure - High-A-AH

## 2024-02-08 ENCOUNTER — Ambulatory Visit: Admitting: Internal Medicine

## 2024-02-08 ENCOUNTER — Encounter: Payer: Self-pay | Admitting: Internal Medicine

## 2024-02-08 VITALS — BP 144/82 | HR 60 | Temp 97.6°F | Ht 66.0 in | Wt 188.8 lb

## 2024-02-08 DIAGNOSIS — I2510 Atherosclerotic heart disease of native coronary artery without angina pectoris: Secondary | ICD-10-CM

## 2024-02-08 DIAGNOSIS — F419 Anxiety disorder, unspecified: Secondary | ICD-10-CM | POA: Diagnosis not present

## 2024-02-08 DIAGNOSIS — I480 Paroxysmal atrial fibrillation: Secondary | ICD-10-CM | POA: Diagnosis not present

## 2024-02-08 DIAGNOSIS — I2583 Coronary atherosclerosis due to lipid rich plaque: Secondary | ICD-10-CM

## 2024-02-08 DIAGNOSIS — I471 Supraventricular tachycardia, unspecified: Secondary | ICD-10-CM | POA: Diagnosis not present

## 2024-02-08 DIAGNOSIS — N1831 Chronic kidney disease, stage 3a: Secondary | ICD-10-CM | POA: Diagnosis not present

## 2024-02-08 DIAGNOSIS — E559 Vitamin D deficiency, unspecified: Secondary | ICD-10-CM

## 2024-02-08 DIAGNOSIS — E1122 Type 2 diabetes mellitus with diabetic chronic kidney disease: Secondary | ICD-10-CM | POA: Diagnosis not present

## 2024-02-08 NOTE — Assessment & Plan Note (Signed)
 Recurrent Cont on metoprolol  succinate 75 mg in the morning and metoprolol  succinate 75 mg in the evening.  Continue to monitor HR/symptoms. She is high risk for recurrent atrial fibrillation given high incidence of atrial ectopy.  Continue warfarin.

## 2024-02-08 NOTE — Assessment & Plan Note (Signed)
 Diabetic Ed referral was offered, declined We discussed meds, GBG checks

## 2024-02-08 NOTE — Assessment & Plan Note (Signed)
 Cont on Simvastatin, Isosorbide, Amlodipine Cont on metoprolol succinate 100 mg in the morning and metoprolol succinate 75 mg in the evening.  Continue to monitor HR/symptoms. She is high risk for recurrent atrial fibrillation given high incidence of atrial ectopy.  Continue warfarin.

## 2024-02-08 NOTE — Assessment & Plan Note (Signed)
 On Vit D daily

## 2024-02-08 NOTE — Assessment & Plan Note (Signed)
Cont on Xanax prn  Potential benefits of a long term benzodiazepines  use as well as potential risks  and complications were explained to the patient and were aknowledged. Not to take w/Tramadol 

## 2024-02-08 NOTE — Progress Notes (Signed)
 Subjective:  Patient ID: [name], female    DOB: 10-Mar-1935  Age: 89 y.o. MRN: [id number]  CC: Medical Management of Chronic Issues (Concerns for diabetes management (yesterday glucose of 137, has had some days in the 180s) and hypertension)   HPI [name] presents for DM - overall CBGs are ok (120-180), HTN, anxiey Lowest CBD is 70  F/u on HTN  Outpatient Medications Prior to Visit  Medication Sig Dispense Refill   ACCU-CHEK GUIDE TEST test strip 1 each by Other route 2 (two) times daily.     Accu-Chek Softclix Lancets lancets USE AS INSTRUCTED 100 each 3   acetaminophen  (TYLENOL ) 500 MG tablet Take 500 mg by mouth every 6 (six) hours as needed.     Alcohol Swabs (DROPSAFE ALCOHOL PREP) 70 % PADS USE TO CLEAN INJECTION SITE TO CHECK BLOOD SUGARS DAILY 100 each 3   ALPRAZolam  (XANAX ) 0.5 MG tablet TAKE 1/2 TO 1 TABLET BY MOUTH THREE TIMES DAILY AS NEEDED FOR ANXIETY. DO NOT TAKE WITH TRAMADOL . THIS IS A 30 DAY SUPPLY 90 tablet 3   amLODipine  (NORVASC ) 5 MG tablet TAKE 1 TABLET(5 MG) BY MOUTH DAILY 90 tablet 3   aspirin  81 MG tablet Take 81 mg by mouth daily.     Cholecalciferol (VITAMIN D3) 50 MCG (2000 UT) capsule TAKE 1 CAPSULE BY MOUTH EVERY DAY 100 capsule 3   furosemide  (LASIX ) 20 MG tablet TAKE 1 TABLET(20 MG) BY MOUTH DAILY AS NEEDED FOR SWELLING 90 tablet 3   glimepiride  (AMARYL ) 1 MG tablet Take 1 tablet (1 mg total) by mouth daily with breakfast. As needed for blood sugar above 180. 90 tablet 1   hydroxypropyl methylcellulose (ISOPTO TEARS) 2.5 % ophthalmic solution Place 1 drop into both eyes 2 (two) times daily.     hyoscyamine  (LEVSIN ) 0.125 MG tablet TAKE 1 TABLET BY MOUTH EVERY 6 HOURS FOR UP TO 10 DAYS AS NEEDED FOR CRAMPING 360 tablet 0   isosorbide  mononitrate (IMDUR ) 30 MG 24 hr tablet TAKE 1 TABLET BY MOUTH EVERY DAY 90 tablet 2   metoprolol  succinate (TOPROL -XL) 50 MG 24 hr tablet TAKE 1 AND 1/2 TABLETS BY MOUTH TWICE DAILY 270 tablet 2    pantoprazole  (PROTONIX ) 40 MG tablet TAKE 1 TABLET BY MOUTH EVERY DAY 90 tablet 3   simvastatin  (ZOCOR ) 20 MG tablet TAKE 1 TABLET(20 MG) BY MOUTH AT BEDTIME 90 tablet 3   warfarin (COUMADIN ) 5 MG tablet TAKE 1/2 TO 1 TABLET BY MOUTH DAILY AS DIRECTED BY COUMADIN  CLINIC 90 tablet 1   diclofenac  Sodium (VOLTAREN ) 1 % GEL Apply 2 g topically 4 (four) times daily. (Patient not taking: Reported on 02/08/2024) 150 g 1   dicyclomine  (BENTYL ) 10 MG capsule Take 1 capsule (10 mg total) by mouth 4 (four) times daily -  before meals and at bedtime. (Patient not taking: Reported on 02/08/2024) 120 capsule 0   No facility-administered medications prior to visit.    ROS: Review of Systems  Constitutional:  Negative for activity change, appetite change, chills, fatigue and unexpected weight change.  HENT:  Negative for congestion, mouth sores and sinus pressure.   Eyes:  Negative for visual disturbance.  Respiratory:  Negative for cough and chest tightness.   Gastrointestinal:  Negative for abdominal pain and nausea.  Genitourinary:  Negative for difficulty urinating, frequency and vaginal pain.  Musculoskeletal:  Positive for gait problem. Negative for back pain.  Skin:  Negative for pallor and rash.  Neurological:  Negative  for dizziness, tremors, weakness, numbness and headaches.  Hematological:  Does not bruise/bleed easily.  Psychiatric/Behavioral:  Positive for decreased concentration. Negative for confusion, sleep disturbance and suicidal ideas. The patient is nervous/anxious.     Objective:  BP (!) 144/82   Pulse 60   Temp 97.6 F (36.4 C)   Ht 5' 6 (1.676 m)   Wt 188 lb 12.8 oz (85.6 kg)   SpO2 96%   BMI 30.47 kg/m   BP Readings from Last 3 Encounters:  02/08/24 (!) 144/82  12/17/23 129/74  10/02/23 (!) 162/80    Wt Readings from Last 3 Encounters:  02/08/24 188 lb 12.8 oz (85.6 kg)  12/17/23 185 lb (83.9 kg)  10/02/23 186 lb 9.6 oz (84.6 kg)    Physical  Exam Constitutional:      General: She is not in acute distress.    Appearance: She is well-developed. She is obese.  HENT:     Head: Normocephalic.     Right Ear: External ear normal.     Left Ear: External ear normal.     Nose: Nose normal.  Eyes:     General:        Right eye: No discharge.        Left eye: No discharge.     Conjunctiva/sclera: Conjunctivae normal.     Pupils: Pupils are equal, round, and reactive to light.  Neck:     Thyroid : No thyromegaly.     Vascular: No JVD.     Trachea: No tracheal deviation.  Cardiovascular:     Rate and Rhythm: Normal rate and regular rhythm.     Heart sounds: Normal heart sounds.  Pulmonary:     Effort: No respiratory distress.     Breath sounds: No stridor. No wheezing.  Abdominal:     General: Bowel sounds are normal. There is no distension.     Palpations: Abdomen is soft. There is no mass.     Tenderness: There is no abdominal tenderness. There is no guarding or rebound.  Musculoskeletal:        General: No tenderness.     Cervical back: Normal range of motion and neck supple. No rigidity.  Lymphadenopathy:     Cervical: No cervical adenopathy.  Skin:    Findings: No erythema or rash.  Neurological:     Mental Status: Mental status is at baseline.     Cranial Nerves: No cranial nerve deficit.     Motor: No abnormal muscle tone.     Coordination: Coordination normal.     Deep Tendon Reflexes: Reflexes normal.  Psychiatric:        Behavior: Behavior normal.        Thought Content: Thought content normal.        Judgment: Judgment normal.   Alert, cooperative  Lab Results  Component Value Date   WBC 4.4 12/17/2023   HGB 12.7 12/17/2023   HCT 38.4 12/17/2023   PLT 211.0 12/17/2023   GLUCOSE 177 (H) 12/17/2023   CHOL 163 11/27/2022   TRIG 149.0 11/27/2022   HDL 57.30 11/27/2022   LDLDIRECT 105.5 11/26/2010   LDLCALC 76 11/27/2022   ALT 13 12/17/2023   AST 19 12/17/2023   NA 136 12/17/2023   K 3.9 12/17/2023    CL 99 12/17/2023   CREATININE 1.06 12/17/2023   BUN 14 12/17/2023   CO2 32 12/17/2023   TSH 1.660 10/22/2022   INR 2.2 01/15/2024   HGBA1C 8.0 (H) 12/17/2023  US  ABDOMEN LIMITED RUQ (LIVER/GB) Result Date: 05/15/2023 CLINICAL DATA:  89 year old female with history of upper abdominal pain. EXAM: ULTRASOUND ABDOMEN LIMITED RIGHT UPPER QUADRANT COMPARISON:  No prior abdominal ultrasound. CT of the abdomen and pelvis 05/15/2023. FINDINGS: Gallbladder: Numerous echogenic foci lying dependently in the gallbladder, some of which demonstrate posterior acoustic shadowing, compatible with a combination of biliary sludge and small gallstones measuring up to 7 mm. Gallbladder is moderately distended. Gallbladder wall thickness is normal at 3 mm. No pericholecystic fluid. Per report from the sonographer, there was no sonographic Murphy's sign on examination. Common bile duct: Diameter: 4 mm. Liver: No focal lesion identified. Within normal limits in parenchymal echogenicity. Portal vein is patent on color Doppler imaging with normal direction of blood flow towards the liver. Other: 2.4 x 2.9 x 2.0 cm hypoechoic lesion in the upper right retroperitoneum corresponds to right adrenal nodule as demonstrated on recent CT examination (previously characterized as an adrenal adenoma). Multiple cysts in the right kidney incidentally noted. IMPRESSION: 1. Cholelithiasis and biliary sludge in the gallbladder, without definitive sonographic evidence of acute cholecystitis at this time. Electronically Signed   By: Toribio Aye M.D.   On: 05/15/2023 07:10   CT ABDOMEN PELVIS W CONTRAST Result Date: 05/15/2023 CLINICAL DATA:  Acute, nonlocalized abdominal pain. EXAM: CT ABDOMEN AND PELVIS WITH CONTRAST TECHNIQUE: Multidetector CT imaging of the abdomen and pelvis was performed using the standard protocol following bolus administration of intravenous contrast. RADIATION DOSE REDUCTION: This exam was performed according to  the departmental dose-optimization program which includes automated exposure control, adjustment of the mA and/or kV according to patient size and/or use of iterative reconstruction technique. CONTRAST:  OMNIPAQUE  IOHEXOL  300 MG/ML  SOLN COMPARISON:  03/28/2021 abdominal MRI FINDINGS: Lower chest: Atheromatous calcification of the aorta and coronaries. Atelectasis at the right lung base. Hepatobiliary: No focal liver abnormality by CT, reference prior MRI.Distended gallbladder with layering calculi. There is some right upper quadrant inflammation with fat stranding, favored to be biliary. No ductal dilatation or ductal stone identified. Pancreas: Generalized atrophy Spleen: Unremarkable. Adrenals/Urinary Tract: History of right adrenal adenoma with stable size measuring 2.4 cm. No hydronephrosis or stone. Multiple bilateral renal cortical cysts. Known solid and cystic mass in the upper pole left kidney measuring 14 mm, previously 11. Unremarkable bladder. Stomach/Bowel: No obstruction. No visible bowel inflammation, including in the colon adjacent to the right upper quadrant fat stranding. Vascular/Lymphatic: No acute vascular abnormality. Multifocal atheromatous calcification of the aorta and iliacs. No mass or adenopathy. Reproductive:Hysterectomy Other: No ascites or pneumoperitoneum. Musculoskeletal: No acute abnormalities. Chronic L1 superior endplate fracture. Lumbar spine degeneration with L4-5 mild anterolisthesis and disc bulging causing foraminal stenosis greater on the right. IMPRESSION: 1. Cholelithiasis, gallbladder distension, and regional fat inflammation concerning for acute cholecystitis. Recommend ultrasound. 2. 14 mm enhancing left renal mass has mildly enlarged from a 2020 MRI, suspected renal cell carcinoma. 3. Other chronic findings are stable. Electronically Signed   By: Dorn Roulette M.D.   On: 05/15/2023 06:06   DG Chest 2 View Result Date: 05/14/2023 CLINICAL DATA:  Chest pain  EXAM: CHEST - 2 VIEW COMPARISON:  10/11/2022 FINDINGS: Cardiac shadow is stable. Lungs are well aerated bilaterally. No focal infiltrate or effusion is seen. No bony abnormality is noted. IMPRESSION: No acute abnormality noted. Electronically Signed   By: Oneil Devonshire M.D.   On: 05/14/2023 21:25    Assessment & Plan:   Problem List Items Addressed This Visit     Anxiety  Cont on Xanax  prn  Potential benefits of a long term benzodiazepines  use as well as potential risks  and complications were explained to the patient and were aknowledged. Not to take w/Tramadol       CAD (coronary artery disease)    Cont on Simvastatin , Isosorbide , Amlodipine  Cont on metoprolol  succinate 100 mg in the morning and metoprolol  succinate 75 mg in the evening.  Continue to monitor HR/symptoms. She is high risk for recurrent atrial fibrillation given high incidence of atrial ectopy.  Continue warfarin.       DM (diabetes mellitus), type 2 with renal complications (HCC) - Primary   Diabetic Ed referral was offered, declined We discussed meds, GBG checks      Relevant Orders   Amb Referral to Nutrition and Diabetic Education   PAF (paroxysmal atrial fibrillation) (HCC)   Cont on metoprolol  succinate 75 mg in the morning and metoprolol  succinate 75 mg in the evening.  Continue to monitor HR/symptoms. She is high risk for recurrent atrial fibrillation given high incidence of atrial ectopy.  Continue warfarin.       PSVT (paroxysmal supraventricular tachycardia)   Recurrent Cont on metoprolol  succinate 75 mg in the morning and metoprolol  succinate 75 mg in the evening.  Continue to monitor HR/symptoms. She is high risk for recurrent atrial fibrillation given high incidence of atrial ectopy.  Continue warfarin.       Vitamin D  deficiency   On Vit D daily         No orders of the defined types were placed in this encounter.     Follow-up: Return in about 3 months (around 05/10/2024) for a follow-up  visit.  Marolyn Noel, MD

## 2024-02-08 NOTE — Assessment & Plan Note (Signed)
 Cont on metoprolol succinate 75 mg in the morning and metoprolol succinate 75 mg in the evening.  Continue to monitor HR/symptoms. She is high risk for recurrent atrial fibrillation given high incidence of atrial ectopy.  Continue warfarin.

## 2024-02-10 ENCOUNTER — Ambulatory Visit: Payer: Self-pay

## 2024-02-10 NOTE — Telephone Encounter (Signed)
 I saw the patient yesterday.  She was here with her daughter.  Her meds were adjusted.  She does not have blood sugars below 70.  Her blood pressure is usually better than SBP180.  Diabetic education appointment is pending.  Her speech is always slurred.   If the family is concerned that her situation has changed since yesterday, they need to take her to an urgent care or ER.   Thank you

## 2024-02-10 NOTE — Telephone Encounter (Signed)
 Attempted to call the pt... phone rang busy... PCP has stated I saw the patient yesterday.  She was here with her daughter.  Her meds were adjusted.  She does not have blood sugars below 70.  Her blood pressure is usually better than SBP180.  Diabetic education appointment is pending.  Her speech is always slurred.   If the family is concerned that her situation has changed since yesterday, they need to take her to an urgent care or ER.   Thank you

## 2024-02-10 NOTE — Telephone Encounter (Signed)
 FYI Only or Action Required?: Action required by provider: clinical question for provider and update on patient condition. Pt refusing to have EMS come check on her. Please call pt.  Patient was last seen in primary care on 02/08/2024 by Plotnikov, Karlynn GAILS, MD.  Called Nurse Triage reporting Blood Sugar Problem and Hypertension.  Symptoms began today.  Interventions attempted: Nothing.  Symptoms are: gradually worsening.  Triage Disposition: Call EMS 911 Now  Patient/caregiver understands and will follow disposition?: No, refuses disposition  Copied from CRM [id number]. Topic: Clinical - Red Word Triage >> Feb 10, 2024 12:22 PM [name] wrote: Red Word that prompted transfer to Nurse Triage: States her blood sugar and blood pressure have been have. States she takes Camptirde 2 mg but only half of the pill? States that brings her blood sugars low, she seems like she's confused and unsure what she is needing. Sounds a bit like slurring her words. Reason for Disposition  Difficult to awaken or acting confused (e.g., disoriented, slurred speech)  Answer Assessment - Initial Assessment Questions Pt noted to have slow slurred speech. Reports she feels fine/the same as she always has. Initially called to confirm that she is supposed to call PCP office to request referral to diabetes educator if her blood sugar trends upward. Reports her CBG 134 this morning. BP check with this nurse over the phone 191/89. Denies cardiac or neuro symptoms beside slow slurred speech. Pt reports she normally talks slow. Offered to have EMS check on pt, pt declines stating she would like this RN to call her granddaughter [name]. Call x3, LVM. Pt reports her son is their with him but he is unable to talk on the phone or assist for unknown reason. Advised pt to take her BP meds and that this RN would call back in an hour to check on pt. Called [facility] and spoke with Rocky, stated she would relay message to nurse to call pt.  Advised pt to call 911 for worsening symptoms.  1. BLOOD PRESSURE: What is your blood pressure? Did you take at least two measurements 5 minutes apart?     191/89, 184/98.  2. ONSET: When did you take your blood pressure?     Today with triage nurse  3. HOW: How did you take your blood pressure? (e.g., automatic home BP monitor, visiting nurse)     Home arm cuff  4. HISTORY: Do you have a history of high blood pressure?     Yes  5. MEDICINES: Are you taking any medicines for blood pressure? Have you missed any doses recently?     Taking amlodipine  and imdur  daily, and metoprolol  bid. Lasix  prn swelling. Has not taken anything yet today. Reports she takes the first dose at 2pm, and then second dose of metoprolol  before bed.  6. OTHER SYMPTOMS: Do you have any symptoms? (e.g., blurred vision, chest pain, difficulty breathing, headache, weakness)     Denies  Protocols used: Blood Pressure - High-A-AH

## 2024-02-20 ENCOUNTER — Other Ambulatory Visit: Payer: Self-pay | Admitting: Internal Medicine

## 2024-02-27 ENCOUNTER — Other Ambulatory Visit: Payer: Self-pay | Admitting: Internal Medicine

## 2024-02-27 DIAGNOSIS — I480 Paroxysmal atrial fibrillation: Secondary | ICD-10-CM

## 2024-03-02 ENCOUNTER — Other Ambulatory Visit: Payer: Self-pay

## 2024-03-02 NOTE — Patient Instructions (Signed)
 Visit Information  Thank you for taking time to visit with me today. Please don't hesitate to contact me if I can be of assistance to you before our next scheduled appointment.  Your next care management appointment is by telephone on 03/28/24 at 1:00 pm   Please call the care guide team at [phone] if you need to cancel, schedule, or reschedule an appointment.   Please call the Suicide and Crisis Lifeline: 988 call the [place]  National Suicide Prevention Lifeline: 929-301-0967 or TTY: 5317021027 TTY [phone]) to talk to a trained counselor if you are experiencing a Mental Health or Behavioral Health Crisis or need someone to talk to.  Heddy Shutter, RN, MSN, BSN, CCM [place]  [facility], Population Health Case Manager Phone: [phone]

## 2024-03-02 NOTE — Patient Outreach (Signed)
 Complex Care Management   Visit Note  03/02/2024  Name:  [name] MRN: [id number] DOB: April 22, 1934  Situation: Referral received for Complex Care Management related to DM I obtained verbal consent from Patient.  Visit completed with Patient  on the phone  Background:   Past Medical History:  Diagnosis Date   Anxiety state, unspecified    Bronchitis, not specified as acute or chronic    Diaphragmatic hernia without mention of obstruction or gangrene    Dyslipidemia    Essential hypertension    Irritable bowel syndrome    Mitral valve disorders(424.0)    a. 08/2010 Echo: EF >55%, mild MR, mod TR, trace AI.   Non-obstructive CAD    a. 07/2010 Lexiscan MV: no ischemia, EF 78%; b. 07/2010 Cath: LM nl, LAD50/60p, 40 apical, LCX nl, RCA dominant, 30/40p, 29m.   Osteoarthrosis, unspecified whether generalized or localized, unspecified site    Osteoporosis, unspecified    Other abnormal glucose    PAF (paroxysmal atrial fibrillation) (HCC)    a. CHA2DS2VASc = 4-->coumadin .   Phlebitis and thrombophlebitis of superficial vessels of lower extremities    PSVT (paroxysmal supraventricular tachycardia)    a. 05/2012 Holter: short bursts of PSVT noted-->Managed with toprol .   Unspecified venous (peripheral) insufficiency    Unspecified vitamin D  deficiency     Assessment: Patient Reported Symptoms:  Cognitive Cognitive Status: No symptoms reported      Neurological Neurological Review of Symptoms: No symptoms reported    HEENT HEENT Symptoms Reported: No symptoms reported      Cardiovascular Cardiovascular Symptoms Reported: No symptoms reported    Respiratory Respiratory Symptoms Reported: No symptoms reported    Endocrine Endocrine Symptoms Reported: No symptoms reported Is patient diabetic?: Yes Is patient checking blood sugars at home?: Yes List most recent blood sugar readings, include date and time of day: Reports BS this am 130 fasting.    Gastrointestinal  Gastrointestinal Symptoms Reported: No symptoms reported      Genitourinary Genitourinary Symptoms Reported: No symptoms reported    Integumentary Integumentary Symptoms Reported: No symptoms reported    Musculoskeletal Musculoskelatal Symptoms Reviewed: No symptoms reported        Psychosocial Psychosocial Symptoms Reported: No symptoms reported          Today's Vitals   03/02/24 1155  BP: 138/74      Medications Reviewed Today     Reviewed by Kelly Albea M, RN (Registered Nurse) on 03/02/24 at 1208  Med List Status: <None>   Medication Order Taking? Sig Documenting Provider Last Dose Status Informant  ACCU-CHEK GUIDE TEST test strip [id number]  1 each by Other route 2 (two) times daily. Provider, Historical, Wiggins  Active   Accu-Chek Softclix Lancets lancets [id number]  USE AS INSTRUCTED [name], [name]  Active   acetaminophen  (TYLENOL ) 500 MG tablet [id number] Yes Take 500 mg by mouth every 6 (six) hours as needed. Provider, Historical, Wiggins  Active Self, Pharmacy Records, Family Member  Alcohol Swabs (DROPSAFE ALCOHOL PREP) 70 % PADS [id number]  USE TO CLEAN INJECTION SITE TO CHECK BLOOD SUGARS DAILY [name], [name], Wiggins  Active   ALPRAZolam  (XANAX ) 0.5 MG tablet [id number] Yes TAKE 1/2 TO 1 TABLET BY MOUTH THREE TIMES DAILY AS NEEDED FOR ANXIETY. DO NOT TAKE WITH TRAMADOL . THIS IS A 30 DAY SUPPLY [name], [name]  Active   amLODipine  (NORVASC ) 5 MG tablet [id number] Yes TAKE 1 TABLET(5 MG) BY MOUTH DAILY [name], [name], Wiggins  Active  aspirin  81 MG tablet [id number] Yes Take 81 mg by mouth daily. Provider, Historical, Wiggins  Active Self, Pharmacy Records, Family Member  Cholecalciferol (VITAMIN D3) 50 MCG (2000 UT) capsule [id number] Yes TAKE 1 CAPSULE BY MOUTH EVERY DAY [name], [name], Wiggins  Active   diclofenac  Sodium (VOLTAREN ) 1 % GEL [id number]  Apply 2 g topically 4 (four) times daily.  Patient not taking: Reported on 03/02/2024   Kelly Ronal CROME, PA-C   Active Self, Pharmacy Records, Family Member  dicyclomine  (BENTYL ) 10 MG capsule [id number]  Take 1 capsule (10 mg total) by mouth 4 (four) times daily -  before meals and at bedtime.  Patient not taking: Reported on 03/02/2024   Kelly Corean CROME, FNP  Active   furosemide  (LASIX ) 20 MG tablet [id number] Yes TAKE 1 TABLET(20 MG) BY MOUTH DAILY AS NEEDED FOR SWELLING [name], [name], Wiggins  Active   glimepiride  (AMARYL ) 1 MG tablet [id number] Yes Take 1 tablet (1 mg total) by mouth daily with breakfast. As needed for blood sugar above 180. [name], [name], Wiggins  Active   hydroxypropyl methylcellulose (ISOPTO TEARS) 2.5 % ophthalmic solution [id number] Yes Place 1 drop into both eyes 2 (two) times daily. Provider, Historical, Wiggins  Active Self, Pharmacy Records, Family Member  hyoscyamine  (LEVSIN ) 0.125 MG tablet [id number] Yes TAKE 1 TABLET BY MOUTH EVERY 6 HOURS AS NEEDED FOR UP TO 10 DAYS FOR CRAMPING [name], [name], Wiggins  Active   isosorbide  mononitrate (IMDUR ) 30 MG 24 hr tablet [id number] Yes TAKE 1 TABLET BY MOUTH EVERY DAY [name], Kelly BROCKS, Wiggins  Active   metoprolol  succinate (TOPROL -XL) 50 MG 24 hr tablet [id number] Yes TAKE 1 AND 1/2 TABLETS BY MOUTH TWICE DAILY Kelly Kelly BROCKS, Wiggins  Active   pantoprazole  (PROTONIX ) 40 MG tablet [id number] Yes TAKE 1 TABLET BY MOUTH EVERY DAY Kelly Kelly BROCKS, Wiggins  Active   simvastatin  (ZOCOR ) 20 MG tablet [id number] Yes TAKE 1 TABLET(20 MG) BY MOUTH AT BEDTIME Kelly Kelly BROCKS, Wiggins  Active   warfarin (COUMADIN ) 5 MG tablet [id number] Yes TAKE 1/2 TO 1 TABLET BY MOUTH DAILY AS DIRECTED BY COUMADIN  CLINIC [name], Kelly BROCKS, Wiggins  Active   Med List Note [name] 05/15/23 2033): [name] [phone]          Recommendation:   Continue Current Plan of Care  Follow Up Plan:   Telephone follow up appointment date/time:  03/28/24 at 1:15 pm  Kelly Shutter, RN, MSN, BSN, CCM [place]  [facility], Population Health Case  Manager Phone: [phone]

## 2024-03-03 ENCOUNTER — Ambulatory Visit: Attending: Internal Medicine

## 2024-03-03 DIAGNOSIS — Z7901 Long term (current) use of anticoagulants: Secondary | ICD-10-CM

## 2024-03-03 DIAGNOSIS — I480 Paroxysmal atrial fibrillation: Secondary | ICD-10-CM | POA: Diagnosis not present

## 2024-03-03 LAB — POCT INR: INR: 2 (ref 2.0–3.0)

## 2024-03-03 NOTE — Patient Instructions (Signed)
 Continue taking Warfarin 1 tablet daily.  Repeat INR in 6 weeks.  Call [phone] with an warfarin related questions. *Consistent amount of greens every week*

## 2024-03-03 NOTE — Progress Notes (Signed)
 INR 2.0 Please see anticoagulation encounter Continue taking Warfarin 1 tablet daily.  Repeat INR in 6 weeks.  Call [phone] with an warfarin related questions. *Consistent amount of greens every week*

## 2024-03-09 ENCOUNTER — Other Ambulatory Visit: Payer: Self-pay | Admitting: Internal Medicine

## 2024-03-17 ENCOUNTER — Encounter: Payer: Self-pay | Admitting: Internal Medicine

## 2024-03-17 ENCOUNTER — Ambulatory Visit: Admitting: Internal Medicine

## 2024-03-17 VITALS — BP 116/60 | HR 59 | Temp 97.9°F | Ht 66.0 in | Wt 187.8 lb

## 2024-03-17 DIAGNOSIS — N1831 Chronic kidney disease, stage 3a: Secondary | ICD-10-CM

## 2024-03-17 DIAGNOSIS — I480 Paroxysmal atrial fibrillation: Secondary | ICD-10-CM | POA: Diagnosis not present

## 2024-03-17 DIAGNOSIS — I10 Essential (primary) hypertension: Secondary | ICD-10-CM

## 2024-03-17 DIAGNOSIS — E1122 Type 2 diabetes mellitus with diabetic chronic kidney disease: Secondary | ICD-10-CM

## 2024-03-17 DIAGNOSIS — B029 Zoster without complications: Secondary | ICD-10-CM | POA: Insufficient documentation

## 2024-03-17 LAB — COMPREHENSIVE METABOLIC PANEL WITH GFR
ALT: 14 U/L (ref 0–35)
AST: 18 U/L (ref 0–37)
Albumin: 4.3 g/dL (ref 3.5–5.2)
Alkaline Phosphatase: 60 U/L (ref 39–117)
BUN: 17 mg/dL (ref 6–23)
CO2: 29 meq/L (ref 19–32)
Calcium: 9.3 mg/dL (ref 8.4–10.5)
Chloride: 101 meq/L (ref 96–112)
Creatinine, Ser: 1.15 mg/dL (ref 0.40–1.20)
GFR: 42.85 mL/min — ABNORMAL LOW (ref >60.00)
Glucose, Bld: 145 mg/dL — ABNORMAL HIGH (ref 70–99)
Potassium: 4.2 meq/L (ref 3.5–5.1)
Sodium: 136 meq/L (ref 135–145)
Total Bilirubin: 0.6 mg/dL (ref 0.2–1.2)
Total Protein: 7.2 g/dL (ref 6.0–8.3)

## 2024-03-17 LAB — HEMOGLOBIN A1C: Hgb A1c MFr Bld: 7 % — ABNORMAL HIGH (ref 4.6–6.5)

## 2024-03-17 MED ORDER — VALACYCLOVIR HCL 1 G PO TABS: 1000.0000 mg | ORAL_TABLET | Freq: Two times a day (BID) | ORAL | 0 refills | Status: AC

## 2024-03-17 MED ORDER — TRIAMCINOLONE ACETONIDE 0.5 % EX CREA: 1.0000 | TOPICAL_CREAM | Freq: Three times a day (TID) | CUTANEOUS | 1 refills | Status: AC

## 2024-03-17 NOTE — Assessment & Plan Note (Signed)
 Diabetic Ed referral was offered, declined We discussed meds, GBG checks

## 2024-03-17 NOTE — Progress Notes (Signed)
 Subjective:  Patient ID: [name], female    DOB: 1934-10-29  Age: 89 y.o. MRN: [id number]  CC: Annual Exam (Annual Exam. New rash on left leg, very itchy but not painful. Treating with OTC topical cream)   HPI [name] presents for a well exam C/o rash on the L knee x 2 wks. No pain. C/o burning and itching F/u on HTN, DM, PAF CBG 130-140  Outpatient Medications Prior to Visit  Medication Sig Dispense Refill   Accu-Chek Softclix Lancets lancets USE AS INSTRUCTED 100 each 3   acetaminophen  (TYLENOL ) 500 MG tablet Take 500 mg by mouth every 6 (six) hours as needed.     Alcohol Swabs (DROPSAFE ALCOHOL PREP) 70 % PADS USE TO CLEAN INJECTION SITE TO CHECK BLOOD SUGARS DAILY 100 each 3   ALPRAZolam  (XANAX ) 0.5 MG tablet TAKE 1/2 TO 1 TABLET BY MOUTH THREE TIMES DAILY AS NEEDED FOR ANXIETY. DO NOT TAKE WITH TRAMADOL . THIS IS A 30 DAY SUPPLY 90 tablet 3   amLODipine  (NORVASC ) 5 MG tablet TAKE 1 TABLET(5 MG) BY MOUTH DAILY 90 tablet 3   aspirin  81 MG tablet Take 81 mg by mouth daily.     Cholecalciferol (VITAMIN D3) 50 MCG (2000 UT) capsule TAKE 1 CAPSULE BY MOUTH EVERY DAY 100 capsule 3   furosemide  (LASIX ) 20 MG tablet TAKE 1 TABLET(20 MG) BY MOUTH DAILY AS NEEDED FOR SWELLING 90 tablet 3   glimepiride  (AMARYL ) 1 MG tablet Take 1 tablet (1 mg total) by mouth daily with breakfast. As needed for blood sugar above 180. 90 tablet 1   glucose blood (ACCU-CHEK GUIDE TEST) test strip USE TO TEST 2 TIMES DAILY 100 strip 6   hydroxypropyl methylcellulose (ISOPTO TEARS) 2.5 % ophthalmic solution Place 1 drop into both eyes 2 (two) times daily.     hyoscyamine  (LEVSIN ) 0.125 MG tablet TAKE 1 TABLET BY MOUTH EVERY 6 HOURS AS NEEDED FOR UP TO 10 DAYS FOR CRAMPING 100 tablet 1   isosorbide  mononitrate (IMDUR ) 30 MG 24 hr tablet TAKE 1 TABLET BY MOUTH EVERY DAY 90 tablet 2   metoprolol  succinate (TOPROL -XL) 50 MG 24 hr tablet TAKE 1 AND 1/2 TABLETS BY MOUTH TWICE DAILY 270 tablet 2    pantoprazole  (PROTONIX ) 40 MG tablet TAKE 1 TABLET BY MOUTH EVERY DAY 90 tablet 3   simvastatin  (ZOCOR ) 20 MG tablet TAKE 1 TABLET(20 MG) BY MOUTH AT BEDTIME 90 tablet 3   warfarin (COUMADIN ) 5 MG tablet TAKE 1/2 TO 1 TABLET BY MOUTH DAILY AS DIRECTED BY COUMADIN  CLINIC 90 tablet 1   diclofenac  Sodium (VOLTAREN ) 1 % GEL Apply 2 g topically 4 (four) times daily. (Patient not taking: Reported on 03/17/2024) 150 g 1   dicyclomine  (BENTYL ) 10 MG capsule Take 1 capsule (10 mg total) by mouth 4 (four) times daily -  before meals and at bedtime. (Patient not taking: Reported on 03/17/2024) 120 capsule 0   No facility-administered medications prior to visit.    ROS: Review of Systems  Constitutional:  Negative for activity change, appetite change, chills, fatigue and unexpected weight change.  HENT:  Negative for congestion, mouth sores and sinus pressure.   Eyes:  Negative for visual disturbance.  Respiratory:  Negative for cough and chest tightness.   Cardiovascular:  Positive for palpitations.  Gastrointestinal:  Negative for abdominal pain and nausea.  Genitourinary:  Negative for difficulty urinating, frequency and vaginal pain.  Musculoskeletal:  Negative for back pain and gait problem.  Skin:  Positive for rash. Negative for pallor.  Neurological:  Negative for dizziness, tremors, weakness, numbness and headaches.  Psychiatric/Behavioral:  Negative for confusion and sleep disturbance.     Objective:  BP 116/60   Pulse (!) 59   Temp 97.9 F (36.6 C)   Ht 5' 6 (1.676 m)   Wt 187 lb 12.8 oz (85.2 kg)   SpO2 98%   BMI 30.31 kg/m   BP Readings from Last 3 Encounters:  03/17/24 116/60  03/02/24 138/74  02/08/24 (!) 144/82    Wt Readings from Last 3 Encounters:  03/17/24 187 lb 12.8 oz (85.2 kg)  02/08/24 188 lb 12.8 oz (85.6 kg)  12/17/23 185 lb (83.9 kg)    Physical Exam Constitutional:      General: She is not in acute distress.    Appearance: She is well-developed.   HENT:     Head: Normocephalic.     Right Ear: External ear normal.     Left Ear: External ear normal.     Nose: Nose normal.  Eyes:     General:        Right eye: No discharge.        Left eye: No discharge.     Conjunctiva/sclera: Conjunctivae normal.     Pupils: Pupils are equal, round, and reactive to light.  Neck:     Thyroid : No thyromegaly.     Vascular: No JVD.     Trachea: No tracheal deviation.  Cardiovascular:     Rate and Rhythm: Normal rate and regular rhythm.     Heart sounds: Normal heart sounds.  Pulmonary:     Effort: No respiratory distress.     Breath sounds: No stridor. No wheezing.  Abdominal:     General: Bowel sounds are normal. There is no distension.     Palpations: Abdomen is soft. There is no mass.     Tenderness: There is no abdominal tenderness. There is no guarding or rebound.  Musculoskeletal:        General: No tenderness.     Cervical back: Normal range of motion and neck supple. No rigidity.  Lymphadenopathy:     Cervical: No cervical adenopathy.  Skin:    Findings: Rash present. No erythema.  Neurological:     Cranial Nerves: No cranial nerve deficit.     Motor: No abnormal muscle tone.     Coordination: Coordination normal.     Deep Tendon Reflexes: Reflexes normal.  Psychiatric:        Behavior: Behavior normal.        Thought Content: Thought content normal.        Judgment: Judgment normal.   Dry vesicles over L lat knee area  Lab Results  Component Value Date   WBC 4.4 12/17/2023   HGB 12.7 12/17/2023   HCT 38.4 12/17/2023   PLT 211.0 12/17/2023   GLUCOSE 177 (H) 12/17/2023   CHOL 163 11/27/2022   TRIG 149.0 11/27/2022   HDL 57.30 11/27/2022   LDLDIRECT 105.5 11/26/2010   LDLCALC 76 11/27/2022   ALT 13 12/17/2023   AST 19 12/17/2023   NA 136 12/17/2023   K 3.9 12/17/2023   CL 99 12/17/2023   CREATININE 1.06 12/17/2023   BUN 14 12/17/2023   CO2 32 12/17/2023   TSH 1.660 10/22/2022   INR 2.0 03/03/2024   HGBA1C  8.0 (H) 12/17/2023    US  ABDOMEN LIMITED RUQ (LIVER/GB) Result Date: 05/15/2023 CLINICAL DATA:  89 year old female with history of upper  abdominal pain. EXAM: ULTRASOUND ABDOMEN LIMITED RIGHT UPPER QUADRANT COMPARISON:  No prior abdominal ultrasound. CT of the abdomen and pelvis 05/15/2023. FINDINGS: Gallbladder: Numerous echogenic foci lying dependently in the gallbladder, some of which demonstrate posterior acoustic shadowing, compatible with a combination of biliary sludge and small gallstones measuring up to 7 mm. Gallbladder is moderately distended. Gallbladder wall thickness is normal at 3 mm. No pericholecystic fluid. Per report from the sonographer, there was no sonographic Murphy's sign on examination. Common bile duct: Diameter: 4 mm. Liver: No focal lesion identified. Within normal limits in parenchymal echogenicity. Portal vein is patent on color Doppler imaging with normal direction of blood flow towards the liver. Other: 2.4 x 2.9 x 2.0 cm hypoechoic lesion in the upper right retroperitoneum corresponds to right adrenal nodule as demonstrated on recent CT examination (previously characterized as an adrenal adenoma). Multiple cysts in the right kidney incidentally noted. IMPRESSION: 1. Cholelithiasis and biliary sludge in the gallbladder, without definitive sonographic evidence of acute cholecystitis at this time. Electronically Signed   By: Toribio Aye M.D.   On: 05/15/2023 07:10   CT ABDOMEN PELVIS W CONTRAST Result Date: 05/15/2023 CLINICAL DATA:  Acute, nonlocalized abdominal pain. EXAM: CT ABDOMEN AND PELVIS WITH CONTRAST TECHNIQUE: Multidetector CT imaging of the abdomen and pelvis was performed using the standard protocol following bolus administration of intravenous contrast. RADIATION DOSE REDUCTION: This exam was performed according to the departmental dose-optimization program which includes automated exposure control, adjustment of the mA and/or kV according to patient size  and/or use of iterative reconstruction technique. CONTRAST:  OMNIPAQUE  IOHEXOL  300 MG/ML  SOLN COMPARISON:  03/28/2021 abdominal MRI FINDINGS: Lower chest: Atheromatous calcification of the aorta and coronaries. Atelectasis at the right lung base. Hepatobiliary: No focal liver abnormality by CT, reference prior MRI.Distended gallbladder with layering calculi. There is some right upper quadrant inflammation with fat stranding, favored to be biliary. No ductal dilatation or ductal stone identified. Pancreas: Generalized atrophy Spleen: Unremarkable. Adrenals/Urinary Tract: History of right adrenal adenoma with stable size measuring 2.4 cm. No hydronephrosis or stone. Multiple bilateral renal cortical cysts. Known solid and cystic mass in the upper pole left kidney measuring 14 mm, previously 11. Unremarkable bladder. Stomach/Bowel: No obstruction. No visible bowel inflammation, including in the colon adjacent to the right upper quadrant fat stranding. Vascular/Lymphatic: No acute vascular abnormality. Multifocal atheromatous calcification of the aorta and iliacs. No mass or adenopathy. Reproductive:Hysterectomy Other: No ascites or pneumoperitoneum. Musculoskeletal: No acute abnormalities. Chronic L1 superior endplate fracture. Lumbar spine degeneration with L4-5 mild anterolisthesis and disc bulging causing foraminal stenosis greater on the right. IMPRESSION: 1. Cholelithiasis, gallbladder distension, and regional fat inflammation concerning for acute cholecystitis. Recommend ultrasound. 2. 14 mm enhancing left renal mass has mildly enlarged from a 2020 MRI, suspected renal cell carcinoma. 3. Other chronic findings are stable. Electronically Signed   By: Dorn Roulette M.D.   On: 05/15/2023 06:06   DG Chest 2 View Result Date: 05/14/2023 CLINICAL DATA:  Chest pain EXAM: CHEST - 2 VIEW COMPARISON:  10/11/2022 FINDINGS: Cardiac shadow is stable. Lungs are well aerated bilaterally. No focal infiltrate or  effusion is seen. No bony abnormality is noted. IMPRESSION: No acute abnormality noted. Electronically Signed   By: Oneil Devonshire M.D.   On: 05/14/2023 21:25    Assessment & Plan:   Problem List Items Addressed This Visit     DM (diabetes mellitus), type 2 with renal complications (HCC)   Diabetic Ed referral was offered, declined We discussed  meds, GBG checks      Relevant Orders   Comprehensive metabolic panel with GFR   Hemoglobin A1c   Essential hypertension   Cont on Isosorbide , Amlodipine  Cont on metoprolol  succinate 75 mg in the morning and metoprolol  succinate 75 mg in the evening.  Continue to monitor HR/symptoms. She is high risk for recurrent atrial fibrillation given high incidence of atrial ectopy.  Continue warfarin.       PAF (paroxysmal atrial fibrillation) (HCC)   Cont on metoprolol  succinate 75 mg in the morning and metoprolol  succinate 75 mg in the evening.  Continue to monitor HR/symptoms. She is high risk for recurrent atrial fibrillation given high incidence of atrial ectopy.  Continue warfarin.       Shingles - Primary   New rash on the L knee x 2 wks. No pain. C/o burning and itching Valtrex  1000 mg bid w/dose per day adjustment x 7 d Triamc cream Not vaccinated      Relevant Medications   valACYclovir  (VALTREX ) 1000 MG tablet      Meds ordered this encounter  Medications   valACYclovir  (VALTREX ) 1000 MG tablet    Sig: Take 1 tablet (1,000 mg total) by mouth 2 (two) times daily for 7 days.    Dispense:  14 tablet    Refill:  0   triamcinolone  cream (KENALOG ) 0.5 %    Sig: Apply 1 Application topically 3 (three) times daily.    Dispense:  45 g    Refill:  1      Follow-up: Return in about 3 months (around 06/15/2024) for a follow-up visit.  Marolyn Noel, MD

## 2024-03-17 NOTE — Assessment & Plan Note (Signed)
 Cont on metoprolol succinate 75 mg in the morning and metoprolol succinate 75 mg in the evening.  Continue to monitor HR/symptoms. She is high risk for recurrent atrial fibrillation given high incidence of atrial ectopy.  Continue warfarin.

## 2024-03-17 NOTE — Assessment & Plan Note (Addendum)
 New rash on the L knee x 2 wks. No pain. C/o burning and itching Valtrex 1000 mg bid w/dose per day adjustment x 7 d Triamc cream Not vaccinated

## 2024-03-17 NOTE — Assessment & Plan Note (Signed)
 Cont on Isosorbide, Amlodipine Cont on metoprolol succinate 75 mg in the morning and metoprolol succinate 75 mg in the evening.  Continue to monitor HR/symptoms. She is high risk for recurrent atrial fibrillation given high incidence of atrial ectopy.  Continue warfarin.

## 2024-03-20 ENCOUNTER — Ambulatory Visit: Payer: Self-pay | Admitting: Internal Medicine

## 2024-03-22 NOTE — Progress Notes (Unsigned)
 Cardiology Office Note:    Date:  03/22/2024   ID:  [name], DOB 01/27/1935, MRN [id number]  PCP:  Garald Karlynn GAILS, MD   [facility] Providers Cardiologist:  Vinie JAYSON Maxcy, MD { Click to update primary MD,subspecialty MD or APP then REFRESH:1}    Referring MD: Plotnikov, Karlynn GAILS, MD   No chief complaint on file. ***  History of Present Illness:    [name] is a 89 y.o. female with a hx of paroxysmal atrial fibrillation and SVT, hypertension and hyperlipidemia.  Nonobstructive CAD by remote heart cath (?2012).  She is anticoagulated with Coumadin  and controlled with beta-blocker.  She was seen in the ER on 09/12/2018 for racing heart rate found to have A. fib RVR.  This was easily treated with IV Lopressor  with good response.  She wore heart monitor following this hospitalization which showed a low burden of A. Fib, with episodes of NSVT.  Recommended increasing beta-blocker.  Echo in 2018 revealed normal EF and mild diastolic dysfunction.  She was last seen by Dr. Maxcy on 12/14/2018.  She is maintained on metoprolol  succinate 50 mg twice daily.  She takes an extra 25 mg for palpitations.  She is on as needed Lasix  and Imdur . I saw her in clinic 05/27/19. She was doing well, but with many medication questions. She also reported palpitations but did not want to take PRN lopressor .   I saw her in 2021.  She saw Dr. Maxcy 09/2023 and was doing well on toprol  75 mg BID. Palpitations reported mostly at night. She was unable to increase to 100 mg toprol  BID. PRN lopressor  made her feel poorly. She takes an extra 0.5 tablet of toprol  for palpitations and this was working well for her.   She presents for routine follow up.      Hypertension - 75 mg toprol  BID, 5 mg norvasc , 30 mg imdur  - pressure here is    Paroxysmal atrial fibrillation Chronic anticoagulation Hx of SVT -Maintained on Toprol  75 mg twice daily - on Coumadin  - This patients CHA2DS2-VASc  Score and unadjusted Ischemic Stroke Rate (% per year) is equal to 4.8 % stroke rate/year from a score of 4 (2age, female, HTN)    Hyperlipidemia - continue 20 mg simvastatin         Past Medical History:  Diagnosis Date   Anxiety state, unspecified    Bronchitis, not specified as acute or chronic    Diaphragmatic hernia without mention of obstruction or gangrene    Dyslipidemia    Essential hypertension    Irritable bowel syndrome    Mitral valve disorders(424.0)    a. 08/2010 Echo: EF >55%, mild MR, mod TR, trace AI.   Non-obstructive CAD    a. 07/2010 Lexiscan MV: no ischemia, EF 78%; b. 07/2010 Cath: LM nl, LAD50/60p, 40 apical, LCX nl, RCA dominant, 30/40p, 88m.   Osteoarthrosis, unspecified whether generalized or localized, unspecified site    Osteoporosis, unspecified    Other abnormal glucose    PAF (paroxysmal atrial fibrillation) (HCC)    a. CHA2DS2VASc = 4-->coumadin .   Phlebitis and thrombophlebitis of superficial vessels of lower extremities    PSVT (paroxysmal supraventricular tachycardia)    a. 05/2012 Holter: short bursts of PSVT noted-->Managed with toprol .   Unspecified venous (peripheral) insufficiency    Unspecified vitamin D  deficiency     Past Surgical History:  Procedure Laterality Date   CARDIAC CATHETERIZATION  07/30/2010   nonobstructive CAD, 20-40% RCA stenosis, 50-60%  eccentric LAD stenosis more prox, 40% LAD stenosis more distal, EF 65%   TRANSTHORACIC ECHOCARDIOGRAM  09/03/2010   EF=>55%; mild MR; mod TR; AV mildly sclerotic with trace regurg; mild pulm valve regurg   VESICOVAGINAL FISTULA CLOSURE W/ TAH      Current Medications: No outpatient medications have been marked as taking for the 03/24/24 encounter (Appointment) with Madie Jon Garre, PA.     Allergies:   Acetaminophen -codeine , Clarithromycin, Fosamax  [alendronate  sodium], Glimepiride , Guaifenesin  er, Metformin, Prandin  [repaglinide ], and Tape   Social History   Socioeconomic  History   Marital status: Widowed    Spouse name: Not on file   Number of children: 4   Years of education: Not on file   Highest education level: Not on file  Occupational History   Occupation: retired    Associate Professor: RETIRED  Tobacco Use   Smoking status: Never    Passive exposure: Never   Smokeless tobacco: Never  Vaping Use   Vaping status: Never Used  Substance and Sexual Activity   Alcohol use: No   Drug use: No   Sexual activity: Not Currently  Other Topics Concern   Not on file  Social History Narrative   Lives with her son.   Social Drivers of Corporate Investment Banker Strain: Low Risk  (08/13/2023)   Overall Financial Resource Strain (CARDIA)    Difficulty of Paying Living Expenses: Not hard at all  Food Insecurity: No Food Insecurity (01/04/2024)   Hunger Vital Sign    Worried About Running Out of Food in the Last Year: Never true    Ran Out of Food in the Last Year: Never true  Transportation Needs: No Transportation Needs (01/04/2024)   PRAPARE - Administrator, Civil Service (Medical): No    Lack of Transportation (Non-Medical): No  Physical Activity: Insufficiently Active (08/13/2023)   Exercise Vital Sign    Days of Exercise per Week: 2 days    Minutes of Exercise per Session: 10 min  Stress: No Stress Concern Present (08/13/2023)   [employer] of Occupational Health - Occupational Stress Questionnaire    Feeling of Stress : Not at all  Social Connections: Moderately Integrated (08/13/2023)   Social Connection and Isolation Panel    Frequency of Communication with Friends and Family: More than three times a week    Frequency of Social Gatherings with Friends and Family: More than three times a week    Attends Religious Services: More than 4 times per year    Active Member of [employer] or Organizations: Yes    Attends Banker Meetings: More than 4 times per year    Marital Status: Widowed     Family History: The patient's ***family  history includes Clotting disorder in her paternal grandmother; Stroke in her brother, father, and mother.  ROS:   Please see the history of present illness.    *** All other systems reviewed and are negative.  EKGs/Labs/Other Studies Reviewed:    The following studies were reviewed today: ***      Recent Labs: 05/17/2023: Magnesium 2.1 12/17/2023: Hemoglobin 12.7; Platelets 211.0 03/17/2024: ALT 14; BUN 17; Creatinine, Ser 1.15; Potassium 4.2; Sodium 136  Recent Lipid Panel    Component Value Date/Time   CHOL 163 11/27/2022 1544   CHOL 161 09/26/2022 0931   TRIG 149.0 11/27/2022 1544   HDL 57.30 11/27/2022 1544   HDL 68 09/26/2022 0931   CHOLHDL 3 11/27/2022 1544   VLDL 29.8 11/27/2022 1544  LDLCALC 76 11/27/2022 1544   LDLCALC 75 09/26/2022 0931   LDLDIRECT 105.5 11/26/2010 0905     Risk Assessment/Calculations:   {Does this patient have ATRIAL FIBRILLATION?[phone]}  No BP recorded.  {Refresh Note OR Click here to enter BP  :1}***         Physical Exam:    VS:  There were no vitals taken for this visit.    Wt Readings from Last 3 Encounters:  03/17/24 187 lb 12.8 oz (85.2 kg)  02/08/24 188 lb 12.8 oz (85.6 kg)  12/17/23 185 lb (83.9 kg)     GEN: *** Well nourished, well developed in no acute distress HEENT: Normal NECK: No JVD; No carotid bruits LYMPHATICS: No lymphadenopathy CARDIAC: ***RRR, no murmurs, rubs, gallops RESPIRATORY:  Clear to auscultation without rales, wheezing or rhonchi  ABDOMEN: Soft, non-tender, non-distended MUSCULOSKELETAL:  No edema; No deformity  SKIN: Warm and dry NEUROLOGIC:  Alert and oriented x 3 PSYCHIATRIC:  Normal affect   ASSESSMENT:    No diagnosis found. PLAN:    In order of problems listed above:  ***      {Are you ordering a CV Procedure (e.g. stress test, cath, DCCV, TEE, etc)?   Press F2        :[id number]}    Medication Adjustments/Labs and Tests Ordered: Current medicines are reviewed at length with  the patient today.  Concerns regarding medicines are outlined above.  No orders of the defined types were placed in this encounter.  No orders of the defined types were placed in this encounter.   There are no Patient Instructions on file for this visit.   Signed, Jon Nat Hails, PA  03/22/2024 4:46 PM    [facility]

## 2024-03-24 ENCOUNTER — Encounter: Payer: Self-pay | Admitting: Physician Assistant

## 2024-03-24 ENCOUNTER — Ambulatory Visit: Attending: Physician Assistant | Admitting: Physician Assistant

## 2024-03-24 VITALS — BP 124/64 | HR 65 | Ht 66.0 in | Wt 191.0 lb

## 2024-03-24 DIAGNOSIS — I493 Ventricular premature depolarization: Secondary | ICD-10-CM | POA: Diagnosis not present

## 2024-03-24 DIAGNOSIS — Z7901 Long term (current) use of anticoagulants: Secondary | ICD-10-CM

## 2024-03-24 DIAGNOSIS — I10 Essential (primary) hypertension: Secondary | ICD-10-CM

## 2024-03-24 DIAGNOSIS — R002 Palpitations: Secondary | ICD-10-CM | POA: Diagnosis not present

## 2024-03-24 DIAGNOSIS — I471 Supraventricular tachycardia, unspecified: Secondary | ICD-10-CM | POA: Diagnosis not present

## 2024-03-24 DIAGNOSIS — E785 Hyperlipidemia, unspecified: Secondary | ICD-10-CM | POA: Diagnosis not present

## 2024-03-24 DIAGNOSIS — I480 Paroxysmal atrial fibrillation: Secondary | ICD-10-CM | POA: Diagnosis not present

## 2024-03-24 NOTE — Patient Instructions (Signed)
 Medication Instructions:  Your physician recommends that you continue on your current medications as directed. Please refer to the Current Medication list given to you today.  *If you need a refill on your cardiac medications before your next appointment, please call your pharmacy*  Lab Work: NONE ordered at this time of appointment   Testing/Procedures: NONE ordered at this time of appointment   Follow-Up: At [facility], you and your health needs are our priority.  As part of our continuing mission to provide you with exceptional heart care, our providers are all part of one team.  This team includes your primary Cardiologist (physician) and Advanced Practice Providers or APPs (Physician Assistants and Nurse Practitioners) who all work together to provide you with the care you need, when you need it.  Your next appointment:   6 month(s)  Provider:   Hazle Lites, MD    We recommend signing up for the patient portal called "MyChart".  Sign up information is provided on this After Visit Summary.  MyChart is used to connect with patients for Virtual Visits (Telemedicine).  Patients are able to view lab/test results, encounter notes, upcoming appointments, etc.  Non-urgent messages can be sent to your provider as well.   To learn more about what you can do with MyChart, go to [email or web address].

## 2024-03-28 ENCOUNTER — Other Ambulatory Visit: Payer: Self-pay

## 2024-03-28 NOTE — Patient Outreach (Signed)
 Complex Care Management   Visit Note  03/28/2024  Name:  [name] MRN: [id number] DOB: Jun 06, 1934  Situation: Referral received for Complex Care Management related to DM I obtained verbal consent from Patient.  Visit completed with Patient  on the phone  Background:   Past Medical History:  Diagnosis Date   Anxiety state, unspecified    Bronchitis, not specified as acute or chronic    Diaphragmatic hernia without mention of obstruction or gangrene    Dyslipidemia    Essential hypertension    Irritable bowel syndrome    Mitral valve disorders(424.0)    a. 08/2010 Echo: EF >55%, mild MR, mod TR, trace AI.   Non-obstructive CAD    a. 07/2010 Lexiscan MV: no ischemia, EF 78%; b. 07/2010 Cath: LM nl, LAD50/60p, 40 apical, LCX nl, RCA dominant, 30/40p, 45m.   Osteoarthrosis, unspecified whether generalized or localized, unspecified site    Osteoporosis, unspecified    Other abnormal glucose    PAF (paroxysmal atrial fibrillation) (HCC)    a. CHA2DS2VASc = 4-->coumadin .   Phlebitis and thrombophlebitis of superficial vessels of lower extremities    PSVT (paroxysmal supraventricular tachycardia)    a. 05/2012 Holter: short bursts of PSVT noted-->Managed with toprol .   Unspecified venous (peripheral) insufficiency    Unspecified vitamin D  deficiency     Assessment: Patient Reported Symptoms:  Cognitive Cognitive Status: No symptoms reported      Neurological Neurological Review of Symptoms: No symptoms reported    HEENT HEENT Symptoms Reported: No symptoms reported      Cardiovascular Cardiovascular Symptoms Reported: No symptoms reported    Respiratory Respiratory Symptoms Reported: No symptoms reported    Endocrine Endocrine Symptoms Reported: No symptoms reported Is patient diabetic?: Yes Is patient checking blood sugars at home?: Yes List most recent blood sugar readings, include date and time of day: BS last checked yesterday morning and BP 124 denies any  hypoglycemic episodes. Endocrine Self-Management Outcome: 4 (good)  Gastrointestinal Gastrointestinal Symptoms Reported: No symptoms reported      Genitourinary Genitourinary Symptoms Reported: No symptoms reported    Integumentary Integumentary Symptoms Reported: No symptoms reported    Musculoskeletal Musculoskelatal Symptoms Reviewed: No symptoms reported Additional Musculoskeletal Details: patient denies difficulty walking, but states she uses a cane when going out. Musculoskeletal Management Strategies: Medical device, Activity, Exercise Falls in the past year?: No    Psychosocial Psychosocial Symptoms Reported: No symptoms reported         There were no vitals filed for this visit. Pain Score: 0-No pain  Medications Reviewed Today     Reviewed by Dai Apel M, RN (Registered Nurse) on 03/28/24 at 1334  Med List Status: <None>   Medication Order Taking? Sig Documenting Provider Last Dose Status Informant  Accu-Chek Softclix Lancets lancets [id number] Yes USE AS INSTRUCTED Plotnikov, Aleksei V, MD  Active   acetaminophen  (TYLENOL ) 500 MG tablet [id number] Yes Take 500 mg by mouth every 6 (six) hours as needed. [provider]  Active Self, Pharmacy Records, Family Member  Alcohol Swabs (DROPSAFE ALCOHOL PREP) 70 % PADS [id number] Yes USE TO CLEAN INJECTION SITE TO CHECK BLOOD SUGARS DAILY Plotnikov, Aleksei V, MD  Active   ALPRAZolam  (XANAX ) 0.5 MG tablet [id number] Yes TAKE 1/2 TO 1 TABLET BY MOUTH THREE TIMES DAILY AS NEEDED FOR ANXIETY. DO NOT TAKE WITH TRAMADOL . THIS IS A 30 DAY SUPPLY Plotnikov, Aleksei V, MD  Active   amLODipine  (NORVASC ) 5 MG tablet [id number] Yes TAKE 1 TABLET(5 MG) BY  MOUTH DAILY Plotnikov, Aleksei V, MD  Active   aspirin  81 MG tablet [id number] Yes Take 81 mg by mouth daily. [provider]  Active Self, Pharmacy Records, Family Member  Cholecalciferol (VITAMIN D3) 50 MCG (2000 UT) capsule [id number] Yes TAKE 1 CAPSULE BY MOUTH EVERY DAY  Plotnikov, Aleksei V, MD  Active   diclofenac  Sodium (VOLTAREN ) 1 % GEL [id number] Yes Apply 2 g topically 4 (four) times daily. Jule Ronal CROME, PA-C  Active Self, Pharmacy Records, Family Member  dicyclomine  (BENTYL ) 10 MG capsule [id number]  Take 1 capsule (10 mg total) by mouth 4 (four) times daily -  before meals and at bedtime.  Patient not taking: Reported on 03/28/2024   Alvia Corean CROME, FNP  Active   furosemide  (LASIX ) 20 MG tablet [id number] Yes TAKE 1 TABLET(20 MG) BY MOUTH DAILY AS NEEDED FOR SWELLING Plotnikov, Aleksei V, MD  Active   glimepiride  (AMARYL ) 1 MG tablet [id number] Yes Take 1 tablet (1 mg total) by mouth daily with breakfast. As needed for blood sugar above 180. Plotnikov, Aleksei V, MD  Active   glucose blood (ACCU-CHEK GUIDE TEST) test strip [id number] Yes USE TO TEST 2 TIMES DAILY Plotnikov, Aleksei V, MD  Active   hydroxypropyl methylcellulose (ISOPTO TEARS) 2.5 % ophthalmic solution [id number] Yes Place 1 drop into both eyes 2 (two) times daily. [provider]  Active Self, Pharmacy Records, Family Member  hyoscyamine  (LEVSIN ) 0.125 MG tablet [id number] Yes TAKE 1 TABLET BY MOUTH EVERY 6 HOURS AS NEEDED FOR UP TO 10 DAYS FOR CRAMPING Plotnikov, Karlynn GAILS, MD  Active   isosorbide  mononitrate (IMDUR ) 30 MG 24 hr tablet [id number] Yes TAKE 1 TABLET BY MOUTH EVERY DAY Hilty, Vinie BROCKS, MD  Active   metoprolol  succinate (TOPROL -XL) 50 MG 24 hr tablet [id number] Yes TAKE 1 AND 1/2 TABLETS BY MOUTH TWICE DAILY Mona Vinie BROCKS, MD  Active   pantoprazole  (PROTONIX ) 40 MG tablet [id number] Yes TAKE 1 TABLET BY MOUTH EVERY DAY Hilty, Vinie BROCKS, MD  Active   simvastatin  (ZOCOR ) 20 MG tablet [id number] Yes TAKE 1 TABLET(20 MG) BY MOUTH AT BEDTIME Hilty, Vinie BROCKS, MD  Active   triamcinolone  cream (KENALOG ) 0.5 % [id number] Yes Apply 1 Application topically 3 (three) times daily. Plotnikov, Aleksei V, MD  Active   warfarin (COUMADIN ) 5 MG tablet [id number] Yes TAKE 1/2 TO 1 TABLET  BY MOUTH DAILY AS DIRECTED BY COUMADIN  CLINIC Hilty, Vinie BROCKS, MD  Active   Med List Note Marisa Nathanel LOISE Bishop 05/15/23 2033): Hilarie Sinha DIL [phone]          Recommendation:   Continue Current Plan of Care Patient to continue to follow up with providers as scheduled and prn  Follow Up Plan:   Telephone follow up appointment date/time:  05/17/24 at 1:00 pm per patient request  Heddy Shutter, RN, MSN, BSN, CCM [place]  [facility], Population Health Case Manager Phone: [phone]

## 2024-03-28 NOTE — Patient Instructions (Signed)
 Visit Information  Thank you for taking time to visit with me today. Please review Action Plan below and take it with you to your provider appointment to discuss. Please don't hesitate to contact me if I can be of assistance to you before our next scheduled appointment.  Your next care management appointment is by telephone on 05/17/24 at 1:00 pm(per patient request)   Please call the care guide team at [phone] if you need to cancel, schedule, or reschedule an appointment.   Please call the Suicide and Crisis Lifeline: 988 call the [place]  National Suicide Prevention Lifeline: 321-428-3629 or TTY: 601-336-2814 TTY (484)861-3638) to talk to a trained counselor if you are experiencing a Mental Health or Behavioral Health Crisis or need someone to talk to.  Heddy Shutter, RN, MSN, BSN, CCM [place]  [facility], Population Health Case Manager Phone: [phone]   Diabetes Action Plan A diabetes action plan is a way for you to manage your symptoms of diabetes, also called diabetes mellitus. The plan is color-coded to guide you on what actions to take based on any symptoms you're having. If you have symptoms in the red zone, you need medical care right away. If you have symptoms in the yellow zone, your diabetes isn't under control, and you may need to make some changes. If you have symptoms in the green zone, you're doing well. Understanding diabetes can take time. Follow the treatment plan that you created with your health care provider. Know the target range for your blood sugar, also called glucose. Review your plan each time you visit your provider. The target range for my blood sugar level is __________________________ mg/dL. Red zone Get medical help right away if you have any of the following symptoms: A blood sugar test result that's below 54 mg/dL (3 mmol/L). A blood sugar test result that's at or above 240 mg/dL (86.6 mmol/L) for 2 days in a row along  with: Extreme thirst and frequent peeing. Confusion or trouble thinking clearly. Moderate or large ketone levels in your pee (urine). Feeling tired or having no energy. Trouble breathing. Sickness or a fever for 2 or more days that's not getting better. These symptoms may be an emergency. Call 911 right away. Do not wait to see if the symptoms will go away. Do not drive yourself to the hospital. If you have very low blood sugar, also called severe hypoglycemia, and you can't eat or drink, you may need glucagon . Make sure a family member or close friend knows how to check your blood sugar and how to give you glucagon . You may need to be treated in a hospital for this condition. Yellow zone If you have any of the following symptoms, your diabetes isn't under control, and you may need to make some changes: A blood sugar test result that's at or above 240 mg/dL (86.6 mmol/L) for 2 days in a row. Blood sugar test results that are below 70 mg/dL (3.9 mmol/L). Other symptoms of hypoglycemia, such as: Shaking or feeling light-headed. Confusion or irritability. Feeling hungry. Having a fast heartbeat. If you have any yellow zone symptoms: Treat your hypoglycemia by eating or drinking 15 grams of a rapid-acting carbohydrate. Follow the 15:15 rule: Take 15 grams of a rapid-acting carbohydrate, such as: 1 tube of glucose gel. 4 glucose pills. 4 oz (120 mL) of fruit juice. 4 oz (120 mL) of regular (not diet) soda. Check your blood sugar again 15 minutes after you take the carbohydrate. If the second blood  sugar test is still at or below 70 mg/dL (3.9 mmol/L), take 15 grams of a carbohydrate again. If your blood sugar doesn't increase above 70 mg/dL (3.9 mmol/L) after 3 tries, get medical help right away. After your blood sugar returns to normal, eat a meal or a snack within 1 hour. Keep taking your daily medicines as told by your provider. Check your blood sugar more often than you normally  would. Write down your results. Call your provider if you have trouble keeping your blood sugar in your target range. Green zone These signs mean you're doing well and can continue what you're doing to manage your diabetes: Your blood sugar is within your personal target range. For most people, a blood sugar level before a meal should be 80-130 mg/dL (4.4-7.2 mmol/L). You feel well, and you're able to do daily activities. If you're in the green zone, continue to manage your diabetes as told by your provider. To do this: Eat a healthy diet. Exercise regularly. Check your blood sugar as told. Take your medicines only as told. Where to find more information American Diabetes Association (ADA): diabetes.org Association of Diabetes Care & Education Specialists (ADCES): adces.org/diabetes-education-dsmes This information is not intended to replace advice given to you by your health care provider. Make sure you discuss any questions you have with your health care provider. Document Revised: 11/19/2022 Document Reviewed: 11/19/2022 Elsevier Patient Education  2024 [employer].

## 2024-03-29 ENCOUNTER — Other Ambulatory Visit: Payer: Self-pay | Admitting: Internal Medicine

## 2024-04-04 ENCOUNTER — Other Ambulatory Visit: Payer: Self-pay | Admitting: Internal Medicine

## 2024-04-04 DIAGNOSIS — Z1231 Encounter for screening mammogram for malignant neoplasm of breast: Secondary | ICD-10-CM

## 2024-04-15 ENCOUNTER — Ambulatory Visit: Attending: Internal Medicine

## 2024-04-19 ENCOUNTER — Encounter: Payer: Self-pay | Admitting: Internal Medicine

## 2024-04-19 ENCOUNTER — Ambulatory Visit: Admitting: Internal Medicine

## 2024-04-19 VITALS — BP 124/72 | HR 67 | Temp 98.0°F | Ht 66.0 in | Wt 190.0 lb

## 2024-04-19 DIAGNOSIS — I10 Essential (primary) hypertension: Secondary | ICD-10-CM

## 2024-04-19 DIAGNOSIS — F419 Anxiety disorder, unspecified: Secondary | ICD-10-CM | POA: Diagnosis not present

## 2024-04-19 DIAGNOSIS — R058 Other specified cough: Secondary | ICD-10-CM | POA: Insufficient documentation

## 2024-04-19 MED ORDER — HYDROCODONE BIT-HOMATROP MBR 5-1.5 MG/5ML PO SOLN: 5.0000 mL | Freq: Four times a day (QID) | ORAL | 0 refills | Status: AC | PRN

## 2024-04-19 MED ORDER — AZITHROMYCIN 250 MG PO TABS: ORAL_TABLET | ORAL | 1 refills | Status: AC

## 2024-04-19 NOTE — Assessment & Plan Note (Signed)
 Mild to mod, c/2 bronchitis vs pna, declines cxr for now, for antibx course zpack x 1, cough med prn,,  to f/u any worsening symptoms or concerns

## 2024-04-19 NOTE — Assessment & Plan Note (Signed)
 BP Readings from Last 3 Encounters:  04/19/24 124/72  03/24/24 124/64  03/17/24 116/60   Stable, pt to continue medical treatment norvasc  5 mg every day, toprol  xl 75 qd

## 2024-04-19 NOTE — Assessment & Plan Note (Signed)
Stable overall, cont xanax prn

## 2024-04-19 NOTE — Progress Notes (Signed)
 Patient ID: [name], female   DOB: 1935/03/10, 89 y.o.   MRN: [id number]        Chief Complaint: follow up productive cough, anxiety, htn        HPI:  [name] is a 89 y.o. female here with friend for support, Here with acute onset mild to mod 2-3 days ST, HA, general weakness and malaise, with prod cough greenish sputum, but Pt denies chest pain, increased sob or doe, wheezing, orthopnea, PND, increased LE swelling, palpitations, dizziness or syncope.   Pt denies polydipsia, polyuria, or new focal neuro s/s.   Denies worsening depressive symptoms, suicidal ideation, or panic; has ongoing anxiety, but has done well with xanax  prn       Wt Readings from Last 3 Encounters:  04/19/24 190 lb (86.2 kg)  03/24/24 191 lb (86.6 kg)  03/17/24 187 lb 12.8 oz (85.2 kg)   BP Readings from Last 3 Encounters:  04/19/24 124/72  03/24/24 124/64  03/17/24 116/60         Past Medical History:  Diagnosis Date   Anxiety state, unspecified    Bronchitis, not specified as acute or chronic    Diaphragmatic hernia without mention of obstruction or gangrene    Dyslipidemia    Essential hypertension    Irritable bowel syndrome    Mitral valve disorders(424.0)    a. 08/2010 Echo: EF >55%, mild MR, mod TR, trace AI.   Non-obstructive CAD    a. 07/2010 Lexiscan MV: no ischemia, EF 78%; b. 07/2010 Cath: LM nl, LAD50/60p, 40 apical, LCX nl, RCA dominant, 30/40p, 45m.   Osteoarthrosis, unspecified whether generalized or localized, unspecified site    Osteoporosis, unspecified    Other abnormal glucose    PAF (paroxysmal atrial fibrillation) (HCC)    a. CHA2DS2VASc = 4-->coumadin .   Phlebitis and thrombophlebitis of superficial vessels of lower extremities    PSVT (paroxysmal supraventricular tachycardia)    a. 05/2012 Holter: short bursts of PSVT noted-->Managed with toprol .   Unspecified venous (peripheral) insufficiency    Unspecified vitamin D  deficiency    Past Surgical History:  Procedure  Laterality Date   CARDIAC CATHETERIZATION  07/30/2010   nonobstructive CAD, 20-40% RCA stenosis, 50-60% eccentric LAD stenosis more prox, 40% LAD stenosis more distal, EF 65%   TRANSTHORACIC ECHOCARDIOGRAM  09/03/2010   EF=>55%; mild MR; mod TR; AV mildly sclerotic with trace regurg; mild pulm valve regurg   VESICOVAGINAL FISTULA CLOSURE W/ TAH      reports that she has never smoked. She has never been exposed to tobacco smoke. She has never used smokeless tobacco. She reports that she does not drink alcohol and does not use drugs. family history includes Clotting disorder in her paternal grandmother; Stroke in her brother, father, and mother. Allergies[1] Medications Ordered Prior to Encounter[2]      ROS:  All others reviewed and negative.  Objective        PE:  BP 124/72 (BP Location: Right Arm, Patient Position: Sitting, Cuff Size: Normal)   Pulse 67   Temp 98 F (36.7 C) (Oral)   Ht 5' 6 (1.676 m)   Wt 190 lb (86.2 kg)   SpO2 99%   BMI 30.67 kg/m                 Constitutional: Pt appears mild ill               HENT: Head: NCAT.  Right Ear: External ear normal.                 Left Ear: External ear normal.                Eyes: . Pupils are equal, round, and reactive to light. Conjunctivae and EOM are normal               Nose: without d/c or deformity               Neck: Neck supple. Gross normal ROM               Cardiovascular: Normal rate and regular rhythm.                 Pulmonary/Chest: Effort normal and breath sounds decreased without rales or wheezing.                               Neurological: Pt is alert. At baseline orientation, motor grossly intact               Skin: Skin is warm. No rashes, no other new lesions, LE edema - none               Psychiatric: Pt behavior is normal without agitation   Micro: none  Cardiac tracings I have personally interpreted today:  none  Pertinent Radiological findings (summarize): none   Lab Results   Component Value Date   WBC 4.4 12/17/2023   HGB 12.7 12/17/2023   HCT 38.4 12/17/2023   PLT 211.0 12/17/2023   GLUCOSE 145 (H) 03/17/2024   CHOL 163 11/27/2022   TRIG 149.0 11/27/2022   HDL 57.30 11/27/2022   LDLDIRECT 105.5 11/26/2010   LDLCALC 76 11/27/2022   ALT 14 03/17/2024   AST 18 03/17/2024   NA 136 03/17/2024   K 4.2 03/17/2024   CL 101 03/17/2024   CREATININE 1.15 03/17/2024   BUN 17 03/17/2024   CO2 29 03/17/2024   TSH 1.660 10/22/2022   INR 2.0 03/03/2024   HGBA1C 7.0 (H) 03/17/2024   Assessment/Plan:  [name] is a 89 y.o. Black or African American [2] female with  has a past medical history of Anxiety state, unspecified, Bronchitis, not specified as acute or chronic, Diaphragmatic hernia without mention of obstruction or gangrene, Dyslipidemia, Essential hypertension, Irritable bowel syndrome, Mitral valve disorders(424.0), Non-obstructive CAD, Osteoarthrosis, unspecified whether generalized or localized, unspecified site, Osteoporosis, unspecified, Other abnormal glucose, PAF (paroxysmal atrial fibrillation) (HCC), Phlebitis and thrombophlebitis of superficial vessels of lower extremities, PSVT (paroxysmal supraventricular tachycardia), Unspecified venous (peripheral) insufficiency, and Unspecified vitamin D  deficiency.  Productive cough Mild to mod, c/2 bronchitis vs pna, declines cxr for now, for antibx course zpack x 1, cough med prn,,  to f/u any worsening symptoms or concerns  Essential hypertension BP Readings from Last 3 Encounters:  04/19/24 124/72  03/24/24 124/64  03/17/24 116/60   Stable, pt to continue medical treatment norvasc  5 mg every day, toprol  xl 75 qd   Anxiety Stable overall, cont xanax  prn  Followup: Return if symptoms worsen or fail to improve.  Lynwood Rush, MD 04/19/2024 1:07 PM [facility]     [1]  Allergies Allergen Reactions   Acetaminophen -Codeine       Made me depressed   Clarithromycin     REACTION: unspecified   Fosamax  [Alendronate  Sodium]  achy   Glimepiride      Dizzy,  ?low sugar   Guaifenesin  Er     itching   Metformin     REACTION: pt states INTOL to Metformin felt drained   Prandin  [Repaglinide ]     Prandin  dropped CBGs to 66 once - she stopped it.   Tape     Adhesive tape---rash  [2]  Current Outpatient Medications on File Prior to Visit  Medication Sig Dispense Refill   Accu-Chek Softclix Lancets lancets USE AS INSTRUCTED 100 each 3   acetaminophen  (TYLENOL ) 500 MG tablet Take 500 mg by mouth every 6 (six) hours as needed.     Alcohol Swabs (DROPSAFE ALCOHOL PREP) 70 % PADS USE TO CLEAN INJECTION SITE TO CHECK BLOOD SUGARS DAILY 100 each 3   ALPRAZolam  (XANAX ) 0.5 MG tablet TAKE 1/2 TO 1 TABLET BY MOUTH THREE TIMES DAILY AS NEEDED FOR ANXIETY. DO NOT TAKE WITH TRAMADOL . THIS IS A 30 DAY SUPPLY 90 tablet 0   amLODipine  (NORVASC ) 5 MG tablet TAKE 1 TABLET(5 MG) BY MOUTH DAILY 90 tablet 3   aspirin  81 MG tablet Take 81 mg by mouth daily.     Cholecalciferol (VITAMIN D3) 50 MCG (2000 UT) capsule TAKE 1 CAPSULE BY MOUTH EVERY DAY 100 capsule 3   diclofenac  Sodium (VOLTAREN ) 1 % GEL Apply 2 g topically 4 (four) times daily. 150 g 1   dicyclomine  (BENTYL ) 10 MG capsule Take 1 capsule (10 mg total) by mouth 4 (four) times daily -  before meals and at bedtime. (Patient not taking: Reported on 03/28/2024) 120 capsule 0   furosemide  (LASIX ) 20 MG tablet TAKE 1 TABLET(20 MG) BY MOUTH DAILY AS NEEDED FOR SWELLING 90 tablet 3   glimepiride  (AMARYL ) 1 MG tablet Take 1 tablet (1 mg total) by mouth daily with breakfast. As needed for blood sugar above 180. 90 tablet 1   glucose blood (ACCU-CHEK GUIDE TEST) test strip USE TO TEST 2 TIMES DAILY 100 strip 6   hydroxypropyl methylcellulose (ISOPTO TEARS) 2.5 % ophthalmic solution Place 1 drop into both eyes 2 (two) times daily.     hyoscyamine  (LEVSIN ) 0.125 MG tablet TAKE 1 TABLET BY  MOUTH EVERY 6 HOURS AS NEEDED FOR UP TO 10 DAYS FOR CRAMPING 100 tablet 1   isosorbide  mononitrate (IMDUR ) 30 MG 24 hr tablet TAKE 1 TABLET BY MOUTH EVERY DAY 90 tablet 2   metoprolol  succinate (TOPROL -XL) 50 MG 24 hr tablet TAKE 1 AND 1/2 TABLETS BY MOUTH TWICE DAILY 270 tablet 2   pantoprazole  (PROTONIX ) 40 MG tablet TAKE 1 TABLET BY MOUTH EVERY DAY 90 tablet 3   simvastatin  (ZOCOR ) 20 MG tablet TAKE 1 TABLET(20 MG) BY MOUTH AT BEDTIME 90 tablet 3   triamcinolone  cream (KENALOG ) 0.5 % Apply 1 Application topically 3 (three) times daily. 45 g 1   warfarin (COUMADIN ) 5 MG tablet TAKE 1/2 TO 1 TABLET BY MOUTH DAILY AS DIRECTED BY COUMADIN  CLINIC 90 tablet 1   No current facility-administered medications on file prior to visit.

## 2024-04-19 NOTE — Patient Instructions (Signed)
 Please take all new medication as prescribed - the antibiotic, and cough medicine as needed  Please continue all other medications as before, and refills have been done if requested.  Please have the pharmacy call with any other refills you may need.  Please keep your appointments with your specialists as you may have planned

## 2024-04-26 ENCOUNTER — Ambulatory Visit
Admission: RE | Admit: 2024-04-26 | Discharge: 2024-04-26 | Disposition: A | Source: Ambulatory Visit | Attending: Internal Medicine | Admitting: Internal Medicine

## 2024-04-26 DIAGNOSIS — Z1231 Encounter for screening mammogram for malignant neoplasm of breast: Secondary | ICD-10-CM

## 2024-05-07 ENCOUNTER — Other Ambulatory Visit: Payer: Self-pay | Admitting: Internal Medicine

## 2024-05-09 ENCOUNTER — Encounter: Admitting: Dietician

## 2024-05-10 ENCOUNTER — Other Ambulatory Visit: Payer: Self-pay | Admitting: Internal Medicine

## 2024-05-17 ENCOUNTER — Telehealth

## 2024-05-18 ENCOUNTER — Other Ambulatory Visit: Payer: Self-pay

## 2024-05-18 NOTE — Patient Outreach (Signed)
 Complex Care Management   Visit Note  05/18/2024  Name:  [name] MRN: [id number] DOB: 1934/10/19  Situation: Referral received for Complex Care Management related to DM I obtained verbal consent from Patient.  Visit completed with Patient  on the phone  Background:   Past Medical History:  Diagnosis Date   Anxiety state, unspecified    Bronchitis, not specified as acute or chronic    Diaphragmatic hernia without mention of obstruction or gangrene    Dyslipidemia    Essential hypertension    Irritable bowel syndrome    Mitral valve disorders(424.0)    a. 08/2010 Echo: EF >55%, mild MR, mod TR, trace AI.   Non-obstructive CAD    a. 07/2010 Lexiscan MV: no ischemia, EF 78%; b. 07/2010 Cath: LM nl, LAD50/60p, 40 apical, LCX nl, RCA dominant, 30/40p, 49m.   Osteoarthrosis, unspecified whether generalized or localized, unspecified site    Osteoporosis, unspecified    Other abnormal glucose    PAF (paroxysmal atrial fibrillation) (HCC)    a. CHA2DS2VASc = 4-->coumadin .   Phlebitis and thrombophlebitis of superficial vessels of lower extremities    PSVT (paroxysmal supraventricular tachycardia)    a. 05/2012 Holter: short bursts of PSVT noted-->Managed with toprol .   Unspecified venous (peripheral) insufficiency    Unspecified vitamin D  deficiency     Assessment: Patient Reported Symptoms:  Cognitive Cognitive Status: Alert and oriented to person, place, and time      Neurological Neurological Review of Symptoms: No symptoms reported    HEENT HEENT Symptoms Reported: No symptoms reported      Cardiovascular Cardiovascular Symptoms Reported: Palpitations (Patient reports palpitaions at times and states she was instructed by cardiology to take extra Metoprolol  if needed for palpitations.) Does patient have uncontrolled Hypertension?: No Weight: 190 lb (86.2 kg) (per patient, weight last week.) Cardiovascular Comment: pt reports having intermitted palpilation once awhile, she  states last time she had it was yesterday.  Respiratory Respiratory Symptoms Reported: No symptoms reported    Endocrine Endocrine Symptoms Reported: No symptoms reported Is patient diabetic?: Yes Is patient checking blood sugars at home?: Yes List most recent blood sugar readings, include date and time of day: BS checked this morning before eating 149 around 12:00 p.m. patient reports eating a candy around 8:00 p.m. and little peice of blueberry mufin.    Gastrointestinal Gastrointestinal Symptoms Reported: No symptoms reported      Genitourinary Genitourinary Symptoms Reported: No symptoms reported    Integumentary Integumentary Symptoms Reported: No symptoms reported    Musculoskeletal Musculoskelatal Symptoms Reviewed: No symptoms reported Additional Musculoskeletal Details: Patient reports she use a stick when she is going out. Patient reports she has some crumping on the right hand, she states she runs warm water on it and eat mustard to help relief crumpy. . she rates her pain at 5. RNCM encourage patient to call her provider if condition deoan't improve or worsens. patient voiced understanding.        Psychosocial Psychosocial Symptoms Reported: No symptoms reported          05/18/2024    PHQ2-9 Depression Screening   Little interest or pleasure in doing things    Feeling down, depressed, or hopeless    PHQ-2 - Total Score    Trouble falling or staying asleep, or sleeping too much    Feeling tired or having little energy    Poor appetite or overeating     Feeling bad about yourself - or that you are a failure  or have let yourself or your family down    Trouble concentrating on things, such as reading the newspaper or watching television    Moving or speaking so slowly that other people could have noticed.  Or the opposite - being so fidgety or restless that you have been moving around a lot more than usual    Thoughts that you would be better off dead, or hurting yourself  in some way    PHQ2-9 Total Score    If you checked off any problems, how difficult have these problems made it for you to do your work, take care of things at home, or get along with other people    Depression Interventions/Treatment      Today's Vitals   05/18/24 1327  Weight: 190 lb (86.2 kg)   Pain Scale: 0-10 Pain Score: 5  Pain Type: Chronic pain Pain Location: Hand Pain Orientation: Right Pain Descriptors / Indicators: Cramping Pain Intervention(s):  (pt reports warm warm helps the crumping.)  Medications Reviewed Today     Reviewed by Shunna Mikaelian M, RN (Registered Nurse) on 05/18/24 at 1323  Med List Status: <None>   Medication Order Taking? Sig Documenting Provider Last Dose Status Informant  Accu-Chek Softclix Lancets lancets [id number] Yes USE AS INSTRUCTED Plotnikov, Aleksei V, MD  Active   acetaminophen  (TYLENOL ) 500 MG tablet [id number] Yes Take 500 mg by mouth every 6 (six) hours as needed. [provider]  Active Self, Pharmacy Records, Family Member  Alcohol Swabs (DROPSAFE ALCOHOL PREP) 70 % PADS [id number] Yes USE TO CLEAN INJECTION SITE TO CHECK BLOOD SUGARS DAILY Plotnikov, Aleksei V, MD  Active   ALPRAZolam  (XANAX ) 0.5 MG tablet [id number] Yes TAKE 1/2 TO 1 TABLET BY MOUTH THREE TIMES DAILY AS NEEDED FOR ANXIETY. DO NOT TAKE WITH TRAMADOL . THIS IS A 30 DAY SUPPLY Plotnikov, Aleksei V, MD  Active   amLODipine  (NORVASC ) 5 MG tablet [id number] Yes TAKE 1 TABLET(5 MG) BY MOUTH DAILY Plotnikov, Aleksei V, MD  Active   aspirin  81 MG tablet [id number] Yes Take 81 mg by mouth daily. [provider]  Active Self, Pharmacy Records, Family Member  Cholecalciferol (VITAMIN D3) 50 MCG (2000 UT) capsule [id number] Yes TAKE 1 CAPSULE BY MOUTH EVERY DAY Plotnikov, Aleksei V, MD  Active   diclofenac  Sodium (VOLTAREN ) 1 % GEL [id number] Yes Apply 2 g topically 4 (four) times daily. Jule Ronal CROME, PA-C  Active Self, Pharmacy Records, Family Member  dicyclomine   (BENTYL ) 10 MG capsule [id number] Yes Take 1 capsule (10 mg total) by mouth 4 (four) times daily -  before meals and at bedtime. Alvia Corean CROME, FNP  Active   furosemide  (LASIX ) 20 MG tablet [id number] Yes TAKE 1 TABLET(20 MG) BY MOUTH DAILY AS NEEDED FOR SWELLING Plotnikov, Aleksei V, MD  Active   glimepiride  (AMARYL ) 1 MG tablet [id number] Yes Take 1 tablet (1 mg total) by mouth daily with breakfast. As needed for blood sugar above 180. Plotnikov, Aleksei V, MD  Active   glucose blood (ACCU-CHEK GUIDE TEST) test strip [id number] Yes USE TO TEST 2 TIMES DAILY Plotnikov, Aleksei V, MD  Active   hydroxypropyl methylcellulose (ISOPTO TEARS) 2.5 % ophthalmic solution [id number] Yes Place 1 drop into both eyes 2 (two) times daily. [provider]  Active Self, Pharmacy Records, Family Member  hyoscyamine  (LEVSIN ) 0.125 MG tablet [id number] Yes TAKE 1 TABLET BY MOUTH EVERY 6 HOURS AS NEEDED FOR UP TO 10 DAYS FOR CRAMPING Plotnikov, Karlynn GAILS, MD  Active  isosorbide  mononitrate (IMDUR ) 30 MG 24 hr tablet [id number] Yes TAKE 1 TABLET BY MOUTH EVERY DAY Hilty, Vinie BROCKS, MD  Active   metoprolol  succinate (TOPROL -XL) 50 MG 24 hr tablet [id number] Yes TAKE 1 AND 1/2 TABLETS BY MOUTH TWICE DAILY Mona Vinie BROCKS, MD  Active   pantoprazole  (PROTONIX ) 40 MG tablet [id number] Yes TAKE 1 TABLET BY MOUTH EVERY DAY Hilty, Vinie BROCKS, MD  Active   simvastatin  (ZOCOR ) 20 MG tablet [id number] Yes TAKE 1 TABLET(20 MG) BY MOUTH AT BEDTIME Mona Vinie BROCKS, MD  Active   triamcinolone  cream (KENALOG ) 0.5 % [id number] Yes Apply 1 Application topically 3 (three) times daily. Plotnikov, Aleksei V, MD  Active   warfarin (COUMADIN ) 5 MG tablet [id number] Yes TAKE 1/2 TO 1 TABLET BY MOUTH DAILY AS DIRECTED BY COUMADIN  CLINIC Hilty, Vinie BROCKS, MD  Active   Med List Note Marisa Nathanel LOISE Bishop 05/15/23 2033): Kippy Melena DIL [phone]          Recommendation:   Continue Current Plan of Care Patient to call and  reschedule nutrition diabetes educator with laura Jobes. Phone number provided [phone]  Follow Up Plan:   Telephone follow up appointment date/time:  06/14/24 at 10:00  Heddy Shutter, RN, MSN, BSN, CCM [place]  [facility], Population Health Case Manager Phone: [phone]

## 2024-05-18 NOTE — Patient Instructions (Signed)
 Visit Information  Thank you for taking time to visit with me today. Please don't hesitate to contact me if I can be of assistance to you before our next scheduled appointment.  Your next care management appointment is by telephone on 06/14/24 at 10:00  Please call the care guide team at [phone] if you need to cancel, schedule, or reschedule an appointment.   Please call the Suicide and Crisis Lifeline: 988 call the [place]  National Suicide Prevention Lifeline: 712-732-4421 or TTY: 6127011939 TTY [phone]) to talk to a trained counselor if you are experiencing a Mental Health or Behavioral Health Crisis or need someone to talk to.  Heddy Shutter, RN, MSN, BSN, CCM [place]  [facility], Population Health Case Manager Phone: [phone]

## 2024-05-30 ENCOUNTER — Ambulatory Visit

## 2024-06-14 ENCOUNTER — Telehealth

## 2024-06-16 ENCOUNTER — Ambulatory Visit: Admitting: Internal Medicine

## 2024-08-15 ENCOUNTER — Ambulatory Visit
